# Patient Record
Sex: Male | Born: 1960 | Race: Black or African American | Hispanic: No | Marital: Married | Smoking: Current every day smoker
Health system: Southern US, Community
[De-identification: ages and names within clinical notes are randomized; demographics above are authoritative.]

## PROBLEM LIST (undated history)

## (undated) DIAGNOSIS — K219 Gastro-esophageal reflux disease without esophagitis: Secondary | ICD-10-CM

## (undated) DIAGNOSIS — I10 Essential (primary) hypertension: Secondary | ICD-10-CM

## (undated) DIAGNOSIS — I219 Acute myocardial infarction, unspecified: Secondary | ICD-10-CM

## (undated) DIAGNOSIS — K5792 Diverticulitis of intestine, part unspecified, without perforation or abscess without bleeding: Secondary | ICD-10-CM

## (undated) DIAGNOSIS — J189 Pneumonia, unspecified organism: Secondary | ICD-10-CM

## (undated) DIAGNOSIS — M1990 Unspecified osteoarthritis, unspecified site: Secondary | ICD-10-CM

## (undated) DIAGNOSIS — G4730 Sleep apnea, unspecified: Secondary | ICD-10-CM

## (undated) DIAGNOSIS — M7120 Synovial cyst of popliteal space [Baker], unspecified knee: Secondary | ICD-10-CM

## (undated) DIAGNOSIS — Z8719 Personal history of other diseases of the digestive system: Secondary | ICD-10-CM

## (undated) HISTORY — PX: CARPAL TUNNEL RELEASE: SHX101

## (undated) HISTORY — PX: CARDIAC CATHETERIZATION: SHX172

## (undated) HISTORY — PX: KNEE SURGERY: SHX244

## (undated) HISTORY — PX: HAND SURGERY: SHX662

---

## 2004-01-04 DIAGNOSIS — I219 Acute myocardial infarction, unspecified: Secondary | ICD-10-CM

## 2004-01-04 HISTORY — DX: Acute myocardial infarction, unspecified: I21.9

## 2005-01-12 ENCOUNTER — Ambulatory Visit (HOSPITAL_BASED_OUTPATIENT_CLINIC_OR_DEPARTMENT_OTHER): Admission: RE | Admit: 2005-01-12 | Discharge: 2005-01-12 | Payer: Self-pay | Admitting: *Deleted

## 2005-07-06 ENCOUNTER — Inpatient Hospital Stay (HOSPITAL_COMMUNITY): Admission: EM | Admit: 2005-07-06 | Discharge: 2005-07-07 | Payer: Self-pay | Admitting: Emergency Medicine

## 2005-07-06 ENCOUNTER — Ambulatory Visit: Payer: Self-pay | Admitting: Family Medicine

## 2005-07-12 ENCOUNTER — Encounter (HOSPITAL_COMMUNITY): Admission: AD | Admit: 2005-07-12 | Discharge: 2005-09-15 | Payer: Self-pay | Admitting: Cardiovascular Disease

## 2005-07-22 ENCOUNTER — Ambulatory Visit: Payer: Self-pay | Admitting: Family Medicine

## 2005-08-08 ENCOUNTER — Ambulatory Visit: Payer: Self-pay | Admitting: Sports Medicine

## 2005-09-07 ENCOUNTER — Ambulatory Visit: Payer: Self-pay | Admitting: Family Medicine

## 2005-09-08 ENCOUNTER — Ambulatory Visit: Payer: Self-pay | Admitting: Family Medicine

## 2005-10-06 ENCOUNTER — Ambulatory Visit: Payer: Self-pay | Admitting: Family Medicine

## 2006-03-02 DIAGNOSIS — R079 Chest pain, unspecified: Secondary | ICD-10-CM | POA: Insufficient documentation

## 2006-03-02 DIAGNOSIS — F411 Generalized anxiety disorder: Secondary | ICD-10-CM

## 2006-03-02 DIAGNOSIS — F419 Anxiety disorder, unspecified: Secondary | ICD-10-CM | POA: Insufficient documentation

## 2006-03-09 ENCOUNTER — Encounter (INDEPENDENT_AMBULATORY_CARE_PROVIDER_SITE_OTHER): Payer: Self-pay | Admitting: Family Medicine

## 2006-03-09 ENCOUNTER — Ambulatory Visit: Payer: Self-pay | Admitting: Family Medicine

## 2006-03-09 DIAGNOSIS — R519 Headache, unspecified: Secondary | ICD-10-CM | POA: Insufficient documentation

## 2006-03-09 DIAGNOSIS — R635 Abnormal weight gain: Secondary | ICD-10-CM | POA: Insufficient documentation

## 2006-03-09 DIAGNOSIS — I10 Essential (primary) hypertension: Secondary | ICD-10-CM | POA: Insufficient documentation

## 2006-03-09 DIAGNOSIS — R51 Headache: Secondary | ICD-10-CM | POA: Insufficient documentation

## 2006-03-09 DIAGNOSIS — R5383 Other fatigue: Secondary | ICD-10-CM

## 2006-03-09 DIAGNOSIS — R5381 Other malaise: Secondary | ICD-10-CM

## 2006-03-09 LAB — CONVERTED CEMR LAB
Free T4: 1.18 ng/dL (ref 0.89–1.80)
TSH: 1.046 microintl units/mL (ref 0.350–5.50)

## 2006-03-10 ENCOUNTER — Encounter (INDEPENDENT_AMBULATORY_CARE_PROVIDER_SITE_OTHER): Payer: Self-pay

## 2006-04-19 ENCOUNTER — Ambulatory Visit: Payer: Self-pay | Admitting: Family Medicine

## 2006-04-19 DIAGNOSIS — J309 Allergic rhinitis, unspecified: Secondary | ICD-10-CM | POA: Insufficient documentation

## 2006-06-20 ENCOUNTER — Encounter (INDEPENDENT_AMBULATORY_CARE_PROVIDER_SITE_OTHER): Payer: Self-pay | Admitting: *Deleted

## 2007-04-17 ENCOUNTER — Telehealth: Payer: Self-pay | Admitting: *Deleted

## 2007-04-19 ENCOUNTER — Encounter (INDEPENDENT_AMBULATORY_CARE_PROVIDER_SITE_OTHER): Payer: Self-pay | Admitting: Family Medicine

## 2007-04-19 ENCOUNTER — Ambulatory Visit: Payer: Self-pay | Admitting: Family Medicine

## 2007-04-19 DIAGNOSIS — E669 Obesity, unspecified: Secondary | ICD-10-CM

## 2007-04-19 DIAGNOSIS — F17200 Nicotine dependence, unspecified, uncomplicated: Secondary | ICD-10-CM

## 2007-04-24 ENCOUNTER — Telehealth (INDEPENDENT_AMBULATORY_CARE_PROVIDER_SITE_OTHER): Payer: Self-pay | Admitting: Family Medicine

## 2007-05-31 ENCOUNTER — Telehealth (INDEPENDENT_AMBULATORY_CARE_PROVIDER_SITE_OTHER): Payer: Self-pay | Admitting: Family Medicine

## 2007-05-31 ENCOUNTER — Ambulatory Visit: Payer: Self-pay | Admitting: Family Medicine

## 2007-05-31 DIAGNOSIS — R4589 Other symptoms and signs involving emotional state: Secondary | ICD-10-CM | POA: Insufficient documentation

## 2007-08-07 ENCOUNTER — Encounter: Payer: Self-pay | Admitting: *Deleted

## 2007-10-07 ENCOUNTER — Emergency Department (HOSPITAL_COMMUNITY): Admission: EM | Admit: 2007-10-07 | Discharge: 2007-10-08 | Payer: Self-pay | Admitting: Emergency Medicine

## 2007-10-16 ENCOUNTER — Telehealth: Payer: Self-pay | Admitting: Family Medicine

## 2007-10-17 ENCOUNTER — Ambulatory Visit: Payer: Self-pay | Admitting: Family Medicine

## 2007-10-17 ENCOUNTER — Encounter (INDEPENDENT_AMBULATORY_CARE_PROVIDER_SITE_OTHER): Payer: Self-pay | Admitting: Family Medicine

## 2007-10-17 ENCOUNTER — Telehealth: Payer: Self-pay | Admitting: *Deleted

## 2007-10-17 DIAGNOSIS — R3129 Other microscopic hematuria: Secondary | ICD-10-CM

## 2007-10-17 DIAGNOSIS — Z87448 Personal history of other diseases of urinary system: Secondary | ICD-10-CM | POA: Insufficient documentation

## 2007-10-17 DIAGNOSIS — R109 Unspecified abdominal pain: Secondary | ICD-10-CM | POA: Insufficient documentation

## 2007-10-17 LAB — CONVERTED CEMR LAB
ALT: 19 units/L
AST: 19 units/L
Glucose, Urine, Semiquant: NEGATIVE
Hemoglobin: 14.8 g/dL
LDL Cholesterol: 86 mg/dL
Specific Gravity, Urine: 1.02
pH: 8

## 2009-01-25 ENCOUNTER — Inpatient Hospital Stay (HOSPITAL_COMMUNITY): Admission: EM | Admit: 2009-01-25 | Discharge: 2009-01-28 | Payer: Self-pay | Admitting: Emergency Medicine

## 2009-01-28 ENCOUNTER — Ambulatory Visit: Payer: Self-pay | Admitting: Infectious Diseases

## 2010-03-22 LAB — DIFFERENTIAL
Basophils Absolute: 0 10*3/uL (ref 0.0–0.1)
Basophils Absolute: 0 10*3/uL (ref 0.0–0.1)
Eosinophils Relative: 2 % (ref 0–5)
Eosinophils Relative: 3 % (ref 0–5)
Lymphocytes Relative: 11 % — ABNORMAL LOW (ref 12–46)
Lymphocytes Relative: 15 % (ref 12–46)
Lymphocytes Relative: 8 % — ABNORMAL LOW (ref 12–46)
Lymphs Abs: 1.5 10*3/uL (ref 0.7–4.0)
Lymphs Abs: 1.7 10*3/uL (ref 0.7–4.0)
Monocytes Absolute: 1.2 10*3/uL — ABNORMAL HIGH (ref 0.1–1.0)
Monocytes Absolute: 1.8 10*3/uL — ABNORMAL HIGH (ref 0.1–1.0)
Neutro Abs: 15.2 10*3/uL — ABNORMAL HIGH (ref 1.7–7.7)
Neutro Abs: 7.9 10*3/uL — ABNORMAL HIGH (ref 1.7–7.7)
Neutro Abs: 9.8 10*3/uL — ABNORMAL HIGH (ref 1.7–7.7)
Neutrophils Relative %: 77 % (ref 43–77)
Neutrophils Relative %: 82 % — ABNORMAL HIGH (ref 43–77)

## 2010-03-22 LAB — CULTURE, ROUTINE-ABSCESS

## 2010-03-22 LAB — CBC
HCT: 41.2 % (ref 39.0–52.0)
Hemoglobin: 12.8 g/dL — ABNORMAL LOW (ref 13.0–17.0)
MCHC: 32.9 g/dL (ref 30.0–36.0)
MCV: 87.4 fL (ref 78.0–100.0)
MCV: 87.8 fL (ref 78.0–100.0)
MCV: 87.8 fL (ref 78.0–100.0)
Platelets: 181 10*3/uL (ref 150–400)
Platelets: 185 10*3/uL (ref 150–400)
Platelets: 201 10*3/uL (ref 150–400)
Platelets: 210 10*3/uL (ref 150–400)
RBC: 4.37 MIL/uL (ref 4.22–5.81)
RBC: 4.41 MIL/uL (ref 4.22–5.81)
RBC: 4.71 MIL/uL (ref 4.22–5.81)
RDW: 12.2 % (ref 11.5–15.5)
RDW: 12.4 % (ref 11.5–15.5)
RDW: 12.7 % (ref 11.5–15.5)
RDW: 12.7 % (ref 11.5–15.5)
WBC: 10.9 10*3/uL — ABNORMAL HIGH (ref 4.0–10.5)
WBC: 16 10*3/uL — ABNORMAL HIGH (ref 4.0–10.5)
WBC: 18.6 10*3/uL — ABNORMAL HIGH (ref 4.0–10.5)

## 2010-03-22 LAB — RAPID URINE DRUG SCREEN, HOSP PERFORMED
Barbiturates: NOT DETECTED
Benzodiazepines: NOT DETECTED
Cocaine: NOT DETECTED
Opiates: POSITIVE — AB

## 2010-03-22 LAB — BASIC METABOLIC PANEL
BUN: 12 mg/dL (ref 6–23)
BUN: 12 mg/dL (ref 6–23)
CO2: 26 mEq/L (ref 19–32)
Calcium: 8.4 mg/dL (ref 8.4–10.5)
Chloride: 110 mEq/L (ref 96–112)
Creatinine, Ser: 0.89 mg/dL (ref 0.4–1.5)
GFR calc Af Amer: >60 mL/min (ref >60)
GFR calc Af Amer: >60 mL/min (ref >60)
GFR calc non Af Amer: >60 mL/min (ref >60)
Glucose, Bld: 108 mg/dL — ABNORMAL HIGH (ref 70–99)
Potassium: 3.7 mEq/L (ref 3.5–5.1)
Sodium: 135 mEq/L (ref 135–145)

## 2010-03-22 LAB — COMPREHENSIVE METABOLIC PANEL
Alkaline Phosphatase: 62 U/L (ref 39–117)
CO2: 26 mEq/L (ref 19–32)
Chloride: 107 mEq/L (ref 96–112)
GFR calc Af Amer: >60 mL/min (ref >60)
GFR calc non Af Amer: >60 mL/min (ref >60)
Glucose, Bld: 128 mg/dL — ABNORMAL HIGH (ref 70–99)
Potassium: 3.5 mEq/L (ref 3.5–5.1)
Sodium: 138 mEq/L (ref 135–145)
Total Bilirubin: 0.6 mg/dL (ref 0.3–1.2)
Total Protein: 5.7 g/dL — ABNORMAL LOW (ref 6.0–8.3)

## 2010-03-22 LAB — HIV ANTIBODY (ROUTINE TESTING W REFLEX): HIV: NONREACTIVE

## 2010-03-22 LAB — CULTURE, BLOOD (ROUTINE X 2)
Culture: NO GROWTH
Culture: NO GROWTH

## 2010-03-22 LAB — LIPID PANEL: Triglycerides: 135 mg/dL (ref <150)

## 2010-05-21 NOTE — Op Note (Signed)
NAME:  Raymond [name], Raymond [name]               ACCOUNT NO.:  [id number]   MEDICAL RECORD NO.:  [id number]          PATIENT TYPE:  AMB   LOCATION:  DSC                          FACILITY:  [facility]   PHYSICIAN:  Lowell Bouton, M.D.DATE OF BIRTH:  01-10-1960   DATE OF PROCEDURE:  01/12/2005  DATE OF DISCHARGE:                                 OPERATIVE REPORT   PREOP DIAGNOSIS:  Right carpal tunnel syndrome.   POSTOP DIAGNOSIS:  Right carpal tunnel syndrome.   PROCEDURE:  Decompression median nerve, right carpal tunnel.   SURGEON:  Dr. Metro Kung.   ANESTHESIA:  0.5% Marcaine local with sedation.   OPERATIVE FINDINGS:  The patient had no masses in the carpal canal. The  motor branch of the nerve was intact.   PROCEDURE:  Under 0.5% Marcaine local anesthesia with a tourniquet on the  right arm. The right hand was prepped and draped in usual fashion and after  exsanguinating the limb, the tourniquet was inflated to 250 mmHg. A 3 cm  longitudinal incision was made in the palm just ulnar to the thenar crease.  Sharp dissection was carried through the subcutaneous tissues. Blunt  dissection was carried through the superficial palmar fascia distal to the  transverse carpal ligament. A hemostat was then placed in the carpal canal  up against the hook of the hamate and the transverse carpal ligament was  divided on the ulnar border of median nerve. The proximal end of the  ligament was divided with the scissors after dissecting the nerve away from  the undersurface of the ligament. The carpal canal was then palpated and was  found to be adequately decompressed the nerve was examined and the motor  branch was identified. The wound was then irrigated copiously with saline.  Skin was closed with 4-0 nylon suture. Sterile dressings were applied  followed by a volar wrist splint. The patient tolerated procedure well, went  to recovery room awake and stable in good condition.      Lowell Bouton, M.D.  Electronically Signed     EMM/MEDQ  D:  01/12/2005  T:  01/12/2005  Job:  [id number]

## 2010-05-21 NOTE — H&P (Signed)
NAME:  Raymond [name], Raymond [name]               ACCOUNT NO.:  [id number]   MEDICAL RECORD NO.:  [id number]          PATIENT TYPE:  EMS   LOCATION:  MAJO                         FACILITY:  [facility]   PHYSICIAN:  Wayne A. Sheffield Slider, M.D.    DATE OF BIRTH:  1960/06/10   DATE OF ADMISSION:  07/06/2005  DATE OF DISCHARGE:                                HISTORY & PHYSICAL   PRIMARY CARE PHYSICIAN:  Chest pain.   HISTORY OF PRESENT ILLNESS:  A 50 year old black male who was walking today  and experienced the onset of chest pressure substernally with shortness of  breath and dizziness, which was not relieved with rest.  EMS was activated.  Gave the patient nitroglycerin and aspirin, which subsequently relieved the  pain.  Patient rated his initial pain at 10 and now is painfree in the  emergency room.   REVIEW OF SYSTEMS:  Positive for history of palpitations, for which he was  told by his doctor at work to stop drinking caffeine.  Patient denied any  left arm or jaw pain accompanying his chest pain today.  He denies any  urinary or GI complaints.  He does have a positive history of recent  intentional weight loss through diet and exercise.  Patient admits to  noncompliance with antihypertensive medications.   PAST MEDICAL HISTORY:  Hypertension.   PAST SURGICAL HISTORY:  Carpal tunnel surgery in February, 2007, placement  of artificial knee cap in 1982, and a left wrist fusion in 1992.   ALLERGIES:  None.   MEDICATIONS:  Include hydrochlorothiazide 25 mg daily, potassium and Lasix,  doses of which are unknown.  Patient has not had any medications in the last  six months.   FAMILY HISTORY:  The patient with a mom who died of lung cancer.  His  father's medical history is unknown.  He has a sister with MS and a sister  with DVTs.   SOCIAL HISTORY:  He lives in [place] with his fiancee.  He works at  [employer].  He smokes 1-1/2 packs per day.  He  has been smoking for  approximately 25 years.  He denies any recent alcohol  use.  He has been sober for approximately 12 years and denies any other drug  use.   PHYSICAL EXAMINATION:  VITAL SIGNS:  Temp 97.6, pulse 84, blood pressure  133/68, respirations 14, 96% on room air.  GENERAL:  He is a well-developed and well-nourished black male alert and  oriented x3.  Slightly anxious.  HEENT:  Normocephalic and atraumatic.  Pupils are equal, round and reactive  to light.  Oral mucosa is pink and moist with poor dentition.  NECK:  Supple.  No lymphadenopathy.  LUNGS:  Clear to auscultation without wheezes, rales, or rhonchi.  CARDIOVASCULAR:  S1 and S2.  Regular rate and rhythm.  No murmurs, rubs or  gallops.  ABDOMEN:  Positive bowel sounds.  Soft, nontender.  No organomegaly.  EXTREMITIES:  Warm with no clubbing or cyanosis.  SKIN:  Without rashes.  NEUROLOGIC:  Cranial nerves II-XII are grossly intact with no focal  deficits.  Strength is 5/5 in both upper and lower extremities.   LABS:  EKG showed a normal sinus rhythm with a rate of approximately 85 with  inverted T waves in leads III and AVR, and 1 mm of ST elevation in V2 and  V3.   Sodium 144, potassium 4.1, chloride 111, BUN 13, creatinine 1.1, glucose 96.  Point-of-care enzymes showed a myoglobin of 58.5, CK-MB 1.2, troponin I less  than 0.05.   Chest x-ray was no acute findings.   ASSESSMENT/PLAN:  A 50 year old black male with:  1.  Atypical chest pain, here for rule out myocardial infarction:  Patient      with a history of hypertension and noncompliance with his medications,      obesity.  Although he has no known family history of cardiac issues, but      given his presentation and EKG changes, we will admit for observation      and to cycle his cardiac enzymes in order to rule out an MI.  If the      patient's cardiac enzymes are positive, will consult cardiology for      stress testing or catheterization.  Otherwise, the patient may  benefit      from stress test as an outpatient.  I will give him an aspirin daily,      holding his nitroglycerin for now.  Will give him nitroglycerin if his      chest pain resumes.  2.  Hypertension:  Blood pressure is stable.  Will not start      antihypertensive.  Will monitor.  3.  Tobacco use:  Encouraged the patient to stop smoking.  Offered him the      nicotine patch.  4.  Obesity:  The patient doing well to lose weight.  Will continue to      encourage him to continue.  Will get him a heart-healthy diet while here      in the hospital.  5.  Social:  Patient needs a primary care physician at discharge.  6.  Deep venous thrombosis prophylaxis:  The patient will be ambulatory.  7.  Gastrointestinal prophylaxis, Protonix.      Devra Dopp, MD    ______________________________  [name], M.D.    TH/MEDQ  D:  07/06/2005  T:  07/06/2005  Job:  [id number]

## 2010-05-21 NOTE — Discharge Summary (Signed)
Raymond [name], Raymond [name]               ACCOUNT NO.:  [id number]   MEDICAL RECORD NO.:  [id number]          PATIENT TYPE:  INP   LOCATION:  4712                         FACILITY:  [facility]   PHYSICIAN:  Lupita Raider, M.D.   DATE OF BIRTH:  04-27-1960   DATE OF ADMISSION:  07/06/2005  DATE OF DISCHARGE:  07/07/2005                                 DISCHARGE SUMMARY   CONSULTATION:  None.   PROCEDURE:  EKG on admission showed T-wave inversion in leads 3 and AVR and  1 mm ST segment elevation in lead V2 and V3.  Repeat EKG on the day of  discharge was mostly unchanged.   DISCHARGE DIAGNOSES:  1.  New onset typical angina with negative cardiac enzymes.  2.  History of hypertension.  3.  Smoking.  4.  Obesity.   DISCHARGE MEDICATIONS:  1.  Aspirin 81 mg p.o. daily.  2.  Nicoderm CQ 20 mg patch one daily x6 weeks.  3.  Nitrostat 0.4 mg sublingual q5 minutes p.r.n. chest pain.   DISCHARGE INSTRUCTIONS:  No diet or activity restrictions, although the  patient is encouraged for smoking cessation.  The patient is to followup at  [facility] outpatient admitting on Tuesday, July 12, 2005 at 7:45 a.m. for a  stress test with Myoview injection to be read by Dr. Ricki Rodriguez, and  also to followup with Dr. Clelia Croft at [facility] on Friday, July 22, 2005 at 3:00 p.m. for establishment of a primary care physician.   CONSULTATION:  None.   LABORATORY DATA:  On admission and discharge, Troponin I 0.02, 0.01, 0.02.  CBC and chemistries were within normal limits except a bicarbonate of 33.  Creatinine 1.2.  TSH 1.065.   BRIEF HISTORY AND PHYSICAL:  Please see dictated H and P on admission for  more details.  Briefly, this is a 50 year old African-American male with a  history of hypertension, tobacco use and abuse with sudden onset of  substernal chest pressure with associated shortness of breath and dizziness  that was not relieved by rest.  Nitro was given by EMS which relieved  his  chest pain.  The patient complained of a history of palpitations last year  at work and was told by his work physician to DC caffeine.  The patient  admits to noncompliance with hypertension medications x6 months and a  history of alcohol abuse, but has been sober x12  years.   HOSPITAL COURSE:  1.  Typical angina.  The patient was admitted for rule out MI secondary to      typical chest pain brought on by exertion and relieved by nitro with EKG      changes (inverted P-waves in 3 and AVR and 1 mm ST segment elevation in      V2, V3 (he was given aspirin by EMS).  Cardiac enzymes were negative x3      and his chest pain did not return.  Dr. Algie Coffer was consulted and      recommended inpatient treadmill stress with Myoview injection along with  a 2D echo.  The patient refused to stay secondary to having to give a      talk on the history of narcotics to an AA group in [place].  Given      that the patient had been pain free throughout admission with no events      on telemetry and no changes in his EKG with negative enzymes, I feel      comfortable allowing him to followup with a stress test on Tuesday with      Dr. Algie Coffer as an outpatient.  He was given script for nitro and      encouraged to take a baby aspirin daily.  He was also set up with me at      [facility] for followup and to establish a primary care physician,      specifically needed to address risk factors such as smoking, cholesterol      and check diabetes.  2.  Hypertension.  The patient remained normotensive throughout his      admission with a goal of less than 140/90.  There was no indication to      begin antihypertensives at this time.  This will need close followup      however, especially depending on the outcome of his cardiac evaluation.  3.  Tobacco use.  The patient was strongly encouraged to stop smoking and      given new prescription for Nicoderm CQ, will need to followup with how      he is  doing and plan for a taper with his followup appointment with me      in two weeks.           ______________________________  Lupita Raider, M.D.     KS/MEDQ  D:  07/07/2005  T:  07/07/2005  Job:  [id number]   cc:   Ricki Rodriguez, M.D.  Fax: [phone]

## 2010-10-04 LAB — URINALYSIS, ROUTINE W REFLEX MICROSCOPIC
Bilirubin Urine: NEGATIVE
Nitrite: NEGATIVE
Specific Gravity, Urine: 1.023
Urobilinogen, UA: 1
pH: 5.5

## 2013-01-23 ENCOUNTER — Ambulatory Visit: Payer: PRIVATE HEALTH INSURANCE | Attending: Internal Medicine | Admitting: Physical Therapy

## 2013-01-23 DIAGNOSIS — M545 Low back pain, unspecified: Secondary | ICD-10-CM | POA: Insufficient documentation

## 2013-01-23 DIAGNOSIS — IMO0001 Reserved for inherently not codable concepts without codable children: Secondary | ICD-10-CM | POA: Insufficient documentation

## 2013-01-23 DIAGNOSIS — M25559 Pain in unspecified hip: Secondary | ICD-10-CM | POA: Insufficient documentation

## 2013-01-28 ENCOUNTER — Ambulatory Visit: Payer: PRIVATE HEALTH INSURANCE | Admitting: Physical Therapy

## 2013-01-30 ENCOUNTER — Ambulatory Visit: Payer: PRIVATE HEALTH INSURANCE | Admitting: Physical Therapy

## 2013-02-04 ENCOUNTER — Encounter: Payer: 59 | Admitting: Rehabilitation

## 2013-02-06 ENCOUNTER — Ambulatory Visit: Payer: PRIVATE HEALTH INSURANCE | Attending: Internal Medicine | Admitting: Rehabilitation

## 2013-02-06 DIAGNOSIS — M545 Low back pain, unspecified: Secondary | ICD-10-CM | POA: Insufficient documentation

## 2013-02-06 DIAGNOSIS — IMO0001 Reserved for inherently not codable concepts without codable children: Secondary | ICD-10-CM | POA: Insufficient documentation

## 2013-02-06 DIAGNOSIS — M25559 Pain in unspecified hip: Secondary | ICD-10-CM | POA: Insufficient documentation

## 2013-02-12 ENCOUNTER — Ambulatory Visit: Payer: PRIVATE HEALTH INSURANCE | Admitting: Rehabilitation

## 2013-02-14 ENCOUNTER — Ambulatory Visit: Payer: PRIVATE HEALTH INSURANCE | Admitting: Rehabilitation

## 2013-02-26 ENCOUNTER — Ambulatory Visit: Payer: PRIVATE HEALTH INSURANCE | Admitting: Rehabilitation

## 2013-02-28 ENCOUNTER — Encounter: Payer: 59 | Admitting: Rehabilitation

## 2013-03-04 ENCOUNTER — Ambulatory Visit: Payer: PRIVATE HEALTH INSURANCE | Attending: Internal Medicine | Admitting: Rehabilitation

## 2013-03-04 DIAGNOSIS — M545 Low back pain, unspecified: Secondary | ICD-10-CM | POA: Insufficient documentation

## 2013-03-04 DIAGNOSIS — IMO0001 Reserved for inherently not codable concepts without codable children: Secondary | ICD-10-CM | POA: Insufficient documentation

## 2013-03-04 DIAGNOSIS — M25559 Pain in unspecified hip: Secondary | ICD-10-CM | POA: Insufficient documentation

## 2013-03-06 ENCOUNTER — Encounter: Payer: 59 | Admitting: Rehabilitation

## 2013-07-17 ENCOUNTER — Encounter (HOSPITAL_COMMUNITY): Payer: Self-pay | Admitting: Emergency Medicine

## 2013-07-17 ENCOUNTER — Emergency Department (HOSPITAL_COMMUNITY): Payer: PRIVATE HEALTH INSURANCE

## 2013-07-17 ENCOUNTER — Emergency Department (HOSPITAL_COMMUNITY)
Admission: EM | Admit: 2013-07-17 | Discharge: 2013-07-17 | Disposition: A | Discharge status: Home / Self Care | Payer: PRIVATE HEALTH INSURANCE | Attending: Emergency Medicine | Admitting: Emergency Medicine

## 2013-07-17 DIAGNOSIS — K5792 Diverticulitis of intestine, part unspecified, without perforation or abscess without bleeding: Secondary | ICD-10-CM

## 2013-07-17 DIAGNOSIS — K5900 Constipation, unspecified: Secondary | ICD-10-CM | POA: Insufficient documentation

## 2013-07-17 DIAGNOSIS — Z79899 Other long term (current) drug therapy: Secondary | ICD-10-CM | POA: Insufficient documentation

## 2013-07-17 DIAGNOSIS — E669 Obesity, unspecified: Secondary | ICD-10-CM | POA: Insufficient documentation

## 2013-07-17 DIAGNOSIS — F17200 Nicotine dependence, unspecified, uncomplicated: Secondary | ICD-10-CM | POA: Insufficient documentation

## 2013-07-17 DIAGNOSIS — K5732 Diverticulitis of large intestine without perforation or abscess without bleeding: Secondary | ICD-10-CM | POA: Insufficient documentation

## 2013-07-17 DIAGNOSIS — M129 Arthropathy, unspecified: Secondary | ICD-10-CM | POA: Insufficient documentation

## 2013-07-17 DIAGNOSIS — I10 Essential (primary) hypertension: Secondary | ICD-10-CM | POA: Insufficient documentation

## 2013-07-17 DIAGNOSIS — R197 Diarrhea, unspecified: Secondary | ICD-10-CM | POA: Insufficient documentation

## 2013-07-17 DIAGNOSIS — I252 Old myocardial infarction: Secondary | ICD-10-CM | POA: Insufficient documentation

## 2013-07-17 HISTORY — DX: Unspecified osteoarthritis, unspecified site: M19.90

## 2013-07-17 HISTORY — DX: Essential (primary) hypertension: I10

## 2013-07-17 HISTORY — DX: Acute myocardial infarction, unspecified: I21.9

## 2013-07-17 LAB — COMPREHENSIVE METABOLIC PANEL
ALBUMIN: 3.9 g/dL (ref 3.5–5.2)
ALT: 15 U/L (ref 0–53)
AST: 21 U/L (ref 0–37)
Alkaline Phosphatase: 88 U/L (ref 39–117)
Anion gap: 14 (ref 5–15)
BUN: 15 mg/dL (ref 6–23)
CALCIUM: 9.6 mg/dL (ref 8.4–10.5)
CO2: 24 meq/L (ref 19–32)
CREATININE: 0.92 mg/dL (ref 0.50–1.35)
Chloride: 104 mEq/L (ref 96–112)
GFR calc Af Amer: >90 mL/min (ref >90)
Glucose, Bld: 106 mg/dL — ABNORMAL HIGH (ref 70–99)
Potassium: 4 mEq/L (ref 3.7–5.3)
Sodium: 142 mEq/L (ref 137–147)
Total Bilirubin: 0.5 mg/dL (ref 0.3–1.2)
Total Protein: 7.5 g/dL (ref 6.0–8.3)

## 2013-07-17 LAB — CBC WITH DIFFERENTIAL/PLATELET
BASOS ABS: 0 10*3/uL (ref 0.0–0.1)
BASOS PCT: 0 % (ref 0–1)
EOS PCT: 1 % (ref 0–5)
Eosinophils Absolute: 0.1 10*3/uL (ref 0.0–0.7)
HEMATOCRIT: 43.4 % (ref 39.0–52.0)
Hemoglobin: 14.6 g/dL (ref 13.0–17.0)
Lymphocytes Relative: 14 % (ref 12–46)
Lymphs Abs: 1.8 10*3/uL (ref 0.7–4.0)
MCH: 28.5 pg (ref 26.0–34.0)
MCHC: 33.6 g/dL (ref 30.0–36.0)
MCV: 84.8 fL (ref 78.0–100.0)
MONO ABS: 1.3 10*3/uL — AB (ref 0.1–1.0)
Monocytes Relative: 10 % (ref 3–12)
Neutro Abs: 10.1 10*3/uL — ABNORMAL HIGH (ref 1.7–7.7)
Neutrophils Relative %: 75 % (ref 43–77)
Platelets: 232 10*3/uL (ref 150–400)
RBC: 5.12 MIL/uL (ref 4.22–5.81)
RDW: 12.4 % (ref 11.5–15.5)
WBC: 13.3 10*3/uL — ABNORMAL HIGH (ref 4.0–10.5)

## 2013-07-17 LAB — URINALYSIS, ROUTINE W REFLEX MICROSCOPIC
Bilirubin Urine: NEGATIVE
Glucose, UA: NEGATIVE mg/dL
Hgb urine dipstick: NEGATIVE
KETONES UR: NEGATIVE mg/dL
LEUKOCYTES UA: NEGATIVE
NITRITE: NEGATIVE
PH: 6.5 (ref 5.0–8.0)
Protein, ur: NEGATIVE mg/dL
Specific Gravity, Urine: 1.025 (ref 1.005–1.030)
Urobilinogen, UA: 1 mg/dL (ref 0.0–1.0)

## 2013-07-17 LAB — LIPASE, BLOOD: LIPASE: 18 U/L (ref 11–59)

## 2013-07-17 MED ORDER — METRONIDAZOLE IN NACL 5-0.79 MG/ML-% IV SOLN
500.0000 mg | Freq: Once | INTRAVENOUS | Status: AC
  Administered 2013-07-17: 500 mg via INTRAVENOUS
  Filled 2013-07-17: qty 100

## 2013-07-17 MED ORDER — METRONIDAZOLE 500 MG PO TABS: 500.0000 mg | ORAL_TABLET | Freq: Three times a day (TID) | ORAL | Status: DC

## 2013-07-17 MED ORDER — OXYCODONE-ACETAMINOPHEN 5-325 MG PO TABS: 2.0000 | ORAL_TABLET | ORAL | Status: DC | PRN

## 2013-07-17 MED ORDER — IOHEXOL 300 MG/ML  SOLN
100.0000 mL | Freq: Once | INTRAMUSCULAR | Status: AC | PRN
  Administered 2013-07-17: 100 mL via INTRAVENOUS

## 2013-07-17 MED ORDER — CIPROFLOXACIN IN D5W 400 MG/200ML IV SOLN: 500.0000 mg | Freq: Once | INTRAVENOUS | Status: DC

## 2013-07-17 MED ORDER — IOHEXOL 300 MG/ML  SOLN
50.0000 mL | Freq: Once | INTRAMUSCULAR | Status: AC | PRN
  Administered 2013-07-17: 50 mL via ORAL

## 2013-07-17 MED ORDER — CIPROFLOXACIN IN D5W 400 MG/200ML IV SOLN
400.0000 mg | Freq: Once | INTRAVENOUS | Status: AC
  Administered 2013-07-17: 400 mg via INTRAVENOUS
  Filled 2013-07-17: qty 200

## 2013-07-17 MED ORDER — MORPHINE SULFATE 4 MG/ML IJ SOLN
4.0000 mg | Freq: Once | INTRAMUSCULAR | Status: AC
  Administered 2013-07-17: 4 mg via INTRAVENOUS
  Filled 2013-07-17: qty 1

## 2013-07-17 MED ORDER — CIPROFLOXACIN HCL 500 MG PO TABS: 500.0000 mg | ORAL_TABLET | Freq: Two times a day (BID) | ORAL | Status: DC

## 2013-07-17 MED ORDER — ONDANSETRON HCL 4 MG/2ML IJ SOLN
4.0000 mg | Freq: Once | INTRAMUSCULAR | Status: AC
  Administered 2013-07-17: 4 mg via INTRAVENOUS
  Filled 2013-07-17: qty 2

## 2013-07-17 MED ORDER — ONDANSETRON 4 MG PO TBDP: 4.0000 mg | ORAL_TABLET | Freq: Three times a day (TID) | ORAL | Status: DC | PRN

## 2013-07-17 NOTE — ED Provider Notes (Signed)
CSN: [id number]     Arrival date & time 07/17/13  3762 History   First MD Initiated Contact with Patient 07/17/13 1032     Chief Complaint  Patient presents with  . Abdominal Pain     (Consider location/radiation/quality/duration/timing/severity/associated sxs/prior Treatment) HPI Comments: Patient is a 53 year old male with a past medical history of hypertension, obesity, and tobacco abuse who presents with a 3 week history of abdominal pain. The pain is located in the left lower abdomen and does not radiate. The pain is described as aching and severe. The pain started gradually and progressively worsened since the onset. Patient reports brief relief of symptoms last night then the pain restarted with greater severity. No alleviating/aggravating factors. The patient has tried nothing for symptoms without relief. Associated symptoms include constipation and now diarrhea. Patient denies fever, headache, NVD, chest pain, SOB, dysuria. Patient reports history of abdominal abscess which felt the same as he does now.   Patient is a 53 y.o. male presenting with abdominal pain.  Abdominal Pain Associated symptoms: diarrhea   Associated symptoms: no chest pain, no chills, no dysuria, no fatigue, no fever, no nausea, no shortness of breath and no vomiting     Past Medical History  Diagnosis Date  . Heart attack   . Arthritis   . Hypertension    Past Surgical History  Procedure Laterality Date  . Knee surgery      left  . Hand surgery      left  . Carpal tunnel release      right   No family history on file. History  Substance Use Topics  . Smoking status: Current Every Day Smoker  . Smokeless tobacco: Never Used  . Alcohol Use: No    Review of Systems  Constitutional: Negative for fever, chills and fatigue.  HENT: Negative for trouble swallowing.   Eyes: Negative for visual disturbance.  Respiratory: Negative for shortness of breath.   Cardiovascular: Negative for chest pain and  palpitations.  Gastrointestinal: Positive for abdominal pain and diarrhea. Negative for nausea and vomiting.  Genitourinary: Negative for dysuria and difficulty urinating.  Musculoskeletal: Negative for arthralgias and neck pain.  Skin: Negative for color change.  Neurological: Negative for dizziness and weakness.  Psychiatric/Behavioral: Negative for dysphoric mood.      Allergies  Review of patient's allergies indicates no known allergies.  Home Medications   Prior to Admission medications   Medication Sig Start Date End Date Taking? Authorizing Provider  amLODipine (NORVASC) 5 MG tablet Take 5 mg by mouth daily.   Yes Historical Provider, MD  ibuprofen (ADVIL,MOTRIN) 200 MG tablet Take 400 mg by mouth every 6 (six) hours as needed for moderate pain.   Yes Historical Provider, MD  lisinopril (PRINIVIL,ZESTRIL) 20 MG tablet Take 20 mg by mouth daily.   Yes Historical Provider, MD  omeprazole (PRILOSEC) 20 MG capsule Take 20 mg by mouth daily.     Yes Historical Provider, MD  potassium chloride SA (K-DUR,KLOR-CON) 20 MEQ tablet Take 20 mEq by mouth daily.   Yes Historical Provider, MD   BP 160/85  Pulse 93  Temp(Src) 99.8 F (37.7 C) (Oral)  Resp 18  Ht 5\' 10"  (1.778 m)  Wt 270 lb (122.471 kg)  BMI 38.74 kg/m2  SpO2 96% Physical Exam  Nursing note and vitals reviewed. Constitutional: He is oriented to person, place, and time. He appears well-developed and well-nourished. No distress.  HENT:  Head: Normocephalic and atraumatic.  Eyes: Conjunctivae and  EOM are normal.  Neck: Normal range of motion.  Cardiovascular: Normal rate and regular rhythm.  Exam reveals no gallop and no friction rub.   No murmur heard. Pulmonary/Chest: Effort normal and breath sounds normal. He has no wheezes. He has no rales. He exhibits no tenderness.  Abdominal: Soft. He exhibits no distension. There is tenderness. There is no rebound and no guarding.  Left lower abdominal tenderness to palpation.  No other focal tenderness or peritoneal signs.   Musculoskeletal: Normal range of motion.  Neurological: He is alert and oriented to person, place, and time. Coordination normal.  Speech is goal-oriented. Moves limbs without ataxia.   Skin: Skin is warm and dry.  Psychiatric: He has a normal mood and affect. His behavior is normal.    ED Course  Procedures (including critical care time) Labs Review Labs Reviewed  CBC WITH DIFFERENTIAL - Abnormal; Notable for the following:    WBC 13.3 (*)    Neutro Abs 10.1 (*)    Monocytes Absolute 1.3 (*)    All other components within normal limits  COMPREHENSIVE METABOLIC PANEL - Abnormal; Notable for the following:    Glucose, Bld 106 (*)    All other components within normal limits  LIPASE, BLOOD  URINALYSIS, ROUTINE W REFLEX MICROSCOPIC    Imaging Review Ct Abdomen Pelvis W Contrast  07/17/2013   CLINICAL DATA:  Left lower quadrant pain, left flank pain.  EXAM: CT ABDOMEN AND PELVIS WITH CONTRAST  TECHNIQUE: Multidetector CT imaging of the abdomen and pelvis was performed using the standard protocol following bolus administration of intravenous contrast.  CONTRAST:  35mL OMNIPAQUE IOHEXOL 300 MG/ML SOLN, 135mL OMNIPAQUE IOHEXOL 300 MG/ML SOLN  COMPARISON:  01/25/2009.  FINDINGS: Lung bases show no acute findings. Heart size normal. No pericardial or pleural effusion.  Liver is unremarkable. Small stones are seen in the gallbladder. Right adrenal gland is unremarkable. Slight nodular thickening of the left adrenal gland, stable. Kidneys, spleen, pancreas, stomach and small bowel are unremarkable. There is thickening of the proximal sigmoid colon with diverticula and pericolonic stranding. Fair amount of stool is seen in the colon, indicative of constipation.  No pathologically enlarged lymph nodes. Tiny pelvic free fluid. Scattered atherosclerotic calcification of the arterial vasculature without abdominal aortic aneurysm. Calcifications are seen in  the prostate. Bladder is fairly decompressed. No worrisome lytic or sclerotic lesions. Degenerative changes are seen in the spine.  IMPRESSION: 1. Wall thickening and inflammatory changes involving the proximal sigmoid colon are indicative of diverticulitis. An underlying mass cannot be definitively excluded. If colonoscopy has not been performed, consider follow-up optical or virtual colonoscopy after antibiotic therapy, as clinically indicated. 2. Tiny pelvic free fluid. 3. Cholelithiasis.   Electronically Signed   By: Lorin Picket M.D.   On: 07/17/2013 13:20   Dg Abd Acute W/chest  07/17/2013   CLINICAL DATA:  Severe left lower abdominal pain for 3 weeks.  EXAM: ACUTE ABDOMEN SERIES (ABDOMEN 2 VIEW & CHEST 1 VIEW)  COMPARISON:  CT abdomen and pelvis 01/25/2009 and chest radiograph 07/06/2005  FINDINGS: The cardiomediastinal silhouette is within normal limits. The lungs are well inflated. Minimal linear opacity in the left lung base may reflect subsegmental atelectasis or scarring. The lungs are otherwise clear. No pleural effusion or pneumothorax is seen.  There is no evidence of intraperitoneal free air. Gas is present in the colon to the level of the rectosigmoid. There is a moderate amount of stool in the proximal colon. Colon is nondilated. There are  a few loops of mildly prominent small bowel in the central abdomen. Lower lumbar spondylosis is noted. Calcification in the pelvis likely represents a phlebolith.  IMPRESSION: 1. Clear lungs. 2. Mild prominence of a few small-bowel loops in the central abdomen without evidence of high-grade obstruction. Mild, regional ileus or possibly partial obstruction are not excluded.   Electronically Signed   By: Logan Bores   On: 07/17/2013 10:41     EKG Interpretation None      MDM   Final diagnoses:  Acute diverticulitis    11:15 AM Patient's labs show elevated WBC. Urinalysis unremarkable. Patient has a low grade temp with remaining vitals stable.  Patient will have morphine and zofran for symptoms. Patient will have CT abdomen and pelvis.   3:05 PM Patient's CT abdomen pelvis shows diverticulitis. Patient will have IV Flagyl and Cipro. Patient is comfortable with being discharged home and returning to the ED with worsening or concerning symptoms. Patient understands the plan and is agreeable.     Alvina Chou, PA-C 07/17/13 1506

## 2013-07-17 NOTE — ED Notes (Signed)
Pt c/o lower abd pain radiating to left flank x 3 wks; worse today; no nausea or vomiting; states has noted blood in stool and urine; facial grimacing

## 2013-07-17 NOTE — ED Provider Notes (Signed)
Medical screening examination/treatment/procedure(s) were performed by non-physician practitioner and as supervising physician I was immediately available for consultation/collaboration.  Leota Jacobsen, MD 07/17/13 [phone]

## 2013-07-17 NOTE — Discharge Instructions (Signed)
Take Cipro and Flagyl as directed until gone. Take Percocet as needed for pain. Take zofran as needed for nausea. Refer to attached documents for more information. Return to the ED with worsening or concerning symptoms.

## 2013-07-17 NOTE — ED Notes (Signed)
Pt states started having abdominal pain, more to L side about 2 weeks ago, stated has been dealing w/ constipation and had a BM, states pain went away, then last night pain came back, states more severe on L side, last BM was this morning, denies n/v/d.

## 2014-09-21 ENCOUNTER — Emergency Department (HOSPITAL_COMMUNITY): Payer: Medicare Other

## 2014-09-21 ENCOUNTER — Emergency Department (EMERGENCY_DEPARTMENT_HOSPITAL)
Admit: 2014-09-21 | Discharge: 2014-09-21 | Disposition: A | Discharge status: Home / Self Care | Payer: Medicare Other | Attending: Emergency Medicine | Admitting: Emergency Medicine

## 2014-09-21 ENCOUNTER — Encounter (HOSPITAL_COMMUNITY): Payer: Self-pay | Admitting: Emergency Medicine

## 2014-09-21 ENCOUNTER — Emergency Department (HOSPITAL_COMMUNITY)
Admission: EM | Admit: 2014-09-21 | Discharge: 2014-09-21 | Disposition: A | Discharge status: Home / Self Care | Payer: Medicare Other | Attending: Emergency Medicine | Admitting: Emergency Medicine

## 2014-09-21 DIAGNOSIS — I252 Old myocardial infarction: Secondary | ICD-10-CM | POA: Diagnosis not present

## 2014-09-21 DIAGNOSIS — Z720 Tobacco use: Secondary | ICD-10-CM | POA: Insufficient documentation

## 2014-09-21 DIAGNOSIS — I10 Essential (primary) hypertension: Secondary | ICD-10-CM | POA: Diagnosis not present

## 2014-09-21 DIAGNOSIS — M7121 Synovial cyst of popliteal space [Baker], right knee: Secondary | ICD-10-CM | POA: Diagnosis not present

## 2014-09-21 DIAGNOSIS — M25561 Pain in right knee: Secondary | ICD-10-CM | POA: Diagnosis not present

## 2014-09-21 DIAGNOSIS — M79609 Pain in unspecified limb: Secondary | ICD-10-CM

## 2014-09-21 LAB — BASIC METABOLIC PANEL
Anion gap: 7 (ref 5–15)
BUN: 10 mg/dL (ref 6–20)
CHLORIDE: 109 mmol/L (ref 101–111)
CO2: 25 mmol/L (ref 22–32)
CREATININE: 0.91 mg/dL (ref 0.61–1.24)
Calcium: 9.4 mg/dL (ref 8.9–10.3)
GFR calc non Af Amer: >60 mL/min (ref >60)
GLUCOSE: 97 mg/dL (ref 65–99)
Potassium: 4 mmol/L (ref 3.5–5.1)
Sodium: 141 mmol/L (ref 135–145)

## 2014-09-21 LAB — CBC
HCT: 45.1 % (ref 39.0–52.0)
Hemoglobin: 15 g/dL (ref 13.0–17.0)
MCH: 29.2 pg (ref 26.0–34.0)
MCHC: 33.3 g/dL (ref 30.0–36.0)
MCV: 87.7 fL (ref 78.0–100.0)
PLATELETS: 221 10*3/uL (ref 150–400)
RBC: 5.14 MIL/uL (ref 4.22–5.81)
RDW: 12.4 % (ref 11.5–15.5)
WBC: 8.5 10*3/uL (ref 4.0–10.5)

## 2014-09-21 LAB — I-STAT TROPONIN, ED: Troponin i, poc: 0 ng/mL (ref 0.00–0.08)

## 2014-09-21 MED ORDER — NAPROXEN 500 MG PO TABS: 500.0000 mg | ORAL_TABLET | Freq: Two times a day (BID) | ORAL | Status: DC

## 2014-09-21 NOTE — ED Notes (Signed)
Right knee pain with posterior swelling. Painful to touch. Previous knee surgery on same (1980's). NOT warm to touch. decreased pain when relaxed

## 2014-09-21 NOTE — Discharge Instructions (Signed)
Baker Cyst °A Baker cyst is a sac-like structure that forms in the back of the knee. It is filled with the same fluid that is located in your knee. This fluid lubricates the bones and cartilage of the knee and allows them to move over each other more easily. °CAUSES  °When the knee becomes injured or inflamed, increased fluid forms in the knee. When this happens, the joint lining is pushed out behind the knee and forms the Baker cyst. This cyst may also be caused by inflammation from arthritic conditions and infections. °SIGNS AND SYMPTOMS  °A Baker cyst usually has no symptoms. When the cyst is substantially enlarged: °· You may feel pressure behind the knee, stiffness in the knee, or a mass in the area behind the knee. °· You may develop pain, redness, and swelling in the calf.  This can suggest a blood clot and requires evaluation by your health care provider. °DIAGNOSIS  °A Baker cyst is most often found during an ultrasound exam. This exam may have been performed for other reasons, and the cyst was found incidentally. Sometimes an MRI is used. This picks up other problems within a joint that an ultrasound exam may not. If the Baker cyst developed immediately after an injury, X-ray exams may be used to diagnose the cyst. °TREATMENT  °The treatment depends on the cause of the cyst. Anti-inflammatory medicines and rest often will be prescribed. If the cyst is caused by a bacterial infection, antibiotic medicines may be prescribed.  °HOME CARE INSTRUCTIONS  °· If the cyst was caused by an injury, for the first 24 hours, keep the injured leg elevated on 2 pillows while lying down. °· For the first 24 hours while you are awake, apply ice to the injured area: °¨ Put ice in a plastic bag. °¨ Place a towel between your skin and the bag. °¨ Leave the ice on for 20 minutes, 2-3 times a day. °· Only take over-the-counter or prescription medicines for pain, discomfort, or fever as directed by your health care  provider. °· Only take antibiotic medicine as directed. Make sure to finish it even if you start to feel better. °MAKE SURE YOU:  °· Understand these instructions. °· Will watch your condition. °· Will get help right away if you are not doing well or get worse. °Document Released: 12/20/2004 Document Revised: 10/10/2012 Document Reviewed: 08/01/2012 °ExitCare® Patient Information ©2015 [employer]. This information is not intended to replace advice given to you by your health care provider. Make sure you discuss any questions you have with your health care provider. ° °

## 2014-09-21 NOTE — ED Provider Notes (Signed)
Patient care transferred from [facility], PA-C, at change of shift, pending LE doppler.  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 No evidence for DVT. Patient can be discharged with care instructions and ortho follow up.  Charlann Lange, PA-C 09/21/14 [place], MD 09/21/14 [phone]

## 2014-09-21 NOTE — Progress Notes (Signed)
VASCULAR LAB PRELIMINARY  PRELIMINARY  PRELIMINARY  PRELIMINARY  Right lower extremity venous duplex completed.    Preliminary report:  There is no DVT or SVT noted in the right lower extremity.  There is a moderate to large Baker's cyst noted in the right popliteal fossa.   KANADY, CANDACE, RVT 09/21/2014, 5:05 PM

## 2014-11-25 NOTE — ED Provider Notes (Signed)
CSN: [id number]     Arrival date & time 09/21/14  1339 History   First MD Initiated Contact with Patient 09/21/14 1624     Chief Complaint  Patient presents with  . Knee Pain     (Consider location/radiation/quality/duration/timing/severity/associated sxs/prior Treatment) HPI Comments: Patient with h/o MI, no blood thinners -- presents with c/o R posterior knee swelling and pain for several days. Worse with palpation. No h/o DVT. [name] ambulatory. No fever, N/V. No injury. The onset of this condition was acute. The course is constant. Alleviating factors: none.    Patient is a 54 y.o. male presenting with knee pain. The history is provided by the patient.  Knee Pain Associated symptoms: no neck pain     Past Medical History  Diagnosis Date  . Heart attack   . Arthritis   . Hypertension    Past Surgical History  Procedure Laterality Date  . Knee surgery      left  . Hand surgery      left  . Carpal tunnel release      right   History reviewed. No pertinent family history. Social History  Substance Use Topics  . Smoking status: Current Every Day Smoker  . Smokeless tobacco: Never Used  . Alcohol Use: No    Review of Systems  Constitutional: Negative for activity change.  Musculoskeletal: Positive for myalgias. Negative for joint swelling, gait problem and neck pain.  Skin: Negative for wound.  Neurological: Negative for weakness and numbness.      Allergies  Review of patient's allergies indicates no known allergies.  Home Medications   Prior to Admission medications   Medication Sig Start Date End Date Taking? Authorizing Provider  amLODipine (NORVASC) 5 MG tablet Take 5 mg by mouth daily.    Historical Provider, MD  ciprofloxacin (CIPRO) 500 MG tablet Take 1 tablet (500 mg total) by mouth 2 (two) times daily. 07/17/13   Kaitlyn Szekalski, PA-C  ibuprofen (ADVIL,MOTRIN) 200 MG tablet Take 400 mg by mouth every 6 (six) hours as needed for moderate pain.     Historical Provider, MD  lisinopril (PRINIVIL,ZESTRIL) 20 MG tablet Take 20 mg by mouth daily.    Historical Provider, MD  metroNIDAZOLE (FLAGYL) 500 MG tablet Take 1 tablet (500 mg total) by mouth 3 (three) times daily. 07/17/13   Kaitlyn Szekalski, PA-C  naproxen (NAPROSYN) 500 MG tablet Take 1 tablet (500 mg total) by mouth 2 (two) times daily. 09/21/14   Charlann Lange, PA-C  omeprazole (PRILOSEC) 20 MG capsule Take 20 mg by mouth daily.      Historical Provider, MD  ondansetron (ZOFRAN ODT) 4 MG disintegrating tablet Take 1 tablet (4 mg total) by mouth every 8 (eight) hours as needed for nausea or vomiting. 07/17/13   Alvina Chou, PA-C  oxyCODONE-acetaminophen (PERCOCET/ROXICET) 5-325 MG per tablet Take 2 tablets by mouth every 4 (four) hours as needed for moderate pain or severe pain. 07/17/13   Kaitlyn Szekalski, PA-C  potassium chloride SA (K-DUR,KLOR-CON) 20 MEQ tablet Take 20 mEq by mouth daily.    Historical Provider, MD   BP 148/92 mmHg  Pulse 71  Temp(Src) 98.3 F (36.8 C) (Oral)  Resp 16  Ht '5\' 11"'$  (1.803 m)  Wt 116.574 kg  BMI 35.86 kg/m2  SpO2 100% Physical Exam  Constitutional: He appears well-developed and well-nourished.  HENT:  Head: Normocephalic and atraumatic.  Eyes: Conjunctivae are normal.  Neck: Normal range of motion. Neck supple.  Cardiovascular:  Pulses:  Dorsalis pedis pulses are 2+ on the right side, and 2+ on the left side.  Pulmonary/Chest: No respiratory distress.  Musculoskeletal:       Right hip: Normal.       Right knee: He exhibits normal range of motion, no swelling and no effusion. Tenderness (popliteal) found.       Right ankle: Normal.       Right upper leg: Normal. He exhibits no tenderness.       Right lower leg: He exhibits no tenderness.       Right foot: Normal.  Neurological: He is alert.  Skin: Skin is warm and dry.  Psychiatric: He has a normal mood and affect.  Nursing note and vitals reviewed.   ED Course  Procedures  (including critical care time) Labs Review Labs Reviewed  BASIC METABOLIC PANEL  [place], ED    Imaging Review No results found. I have personally reviewed and evaluated these images and lab results as part of my medical decision-making.   EKG Interpretation None      Patient seen and examined. Doppler ordered. Labs ordered on arrival to ED, reassuring. DVT study ordered. Signout to [employer] at shift change.    Vital signs reviewed and are as follows: BP 148/92 mmHg  Pulse 71  Temp(Src) 98.3 F (36.8 C) (Oral)  Resp 16  Ht '5\' 11"'$  (1.803 m)  Wt 116.574 kg  BMI 35.86 kg/m2  SpO2 100%    MDM    Pending completion of [place] to r/o DVT.    Carlisle Cater, PA-C 11/25/14 1652  Merrily Pew, MD 11/27/14 [phone]

## 2015-02-28 ENCOUNTER — Emergency Department (HOSPITAL_COMMUNITY): Payer: Medicare HMO

## 2015-02-28 ENCOUNTER — Encounter (HOSPITAL_COMMUNITY): Payer: Self-pay | Admitting: Emergency Medicine

## 2015-02-28 ENCOUNTER — Observation Stay (HOSPITAL_COMMUNITY)
Admission: EM | Admit: 2015-02-28 | Discharge: 2015-03-02 | Disposition: A | Discharge status: Home / Self Care | Payer: Medicare HMO | Attending: Internal Medicine | Admitting: Internal Medicine

## 2015-02-28 ENCOUNTER — Emergency Department (HOSPITAL_BASED_OUTPATIENT_CLINIC_OR_DEPARTMENT_OTHER): Payer: Medicare HMO

## 2015-02-28 DIAGNOSIS — I252 Old myocardial infarction: Secondary | ICD-10-CM | POA: Diagnosis not present

## 2015-02-28 DIAGNOSIS — M1990 Unspecified osteoarthritis, unspecified site: Secondary | ICD-10-CM | POA: Insufficient documentation

## 2015-02-28 DIAGNOSIS — Z9114 Patient's other noncompliance with medication regimen: Secondary | ICD-10-CM | POA: Diagnosis not present

## 2015-02-28 DIAGNOSIS — F17200 Nicotine dependence, unspecified, uncomplicated: Secondary | ICD-10-CM | POA: Diagnosis present

## 2015-02-28 DIAGNOSIS — M7989 Other specified soft tissue disorders: Secondary | ICD-10-CM

## 2015-02-28 DIAGNOSIS — R002 Palpitations: Secondary | ICD-10-CM | POA: Diagnosis not present

## 2015-02-28 DIAGNOSIS — Z23 Encounter for immunization: Secondary | ICD-10-CM | POA: Insufficient documentation

## 2015-02-28 DIAGNOSIS — R0789 Other chest pain: Principal | ICD-10-CM | POA: Insufficient documentation

## 2015-02-28 DIAGNOSIS — R079 Chest pain, unspecified: Secondary | ICD-10-CM | POA: Diagnosis present

## 2015-02-28 DIAGNOSIS — M7121 Synovial cyst of popliteal space [Baker], right knee: Secondary | ICD-10-CM | POA: Insufficient documentation

## 2015-02-28 DIAGNOSIS — M7120 Synovial cyst of popliteal space [Baker], unspecified knee: Secondary | ICD-10-CM | POA: Diagnosis present

## 2015-02-28 DIAGNOSIS — M79609 Pain in unspecified limb: Secondary | ICD-10-CM | POA: Diagnosis not present

## 2015-02-28 DIAGNOSIS — I10 Essential (primary) hypertension: Secondary | ICD-10-CM | POA: Diagnosis not present

## 2015-02-28 DIAGNOSIS — R9431 Abnormal electrocardiogram [ECG] [EKG]: Secondary | ICD-10-CM | POA: Diagnosis present

## 2015-02-28 HISTORY — DX: Diverticulitis of intestine, part unspecified, without perforation or abscess without bleeding: K57.92

## 2015-02-28 HISTORY — DX: Synovial cyst of popliteal space (Baker), unspecified knee: M71.20

## 2015-02-28 LAB — BASIC METABOLIC PANEL
Anion gap: 10 (ref 5–15)
BUN: 11 mg/dL (ref 6–20)
CALCIUM: 8.9 mg/dL (ref 8.9–10.3)
CO2: 23 mmol/L (ref 22–32)
CREATININE: 0.89 mg/dL (ref 0.61–1.24)
Chloride: 108 mmol/L (ref 101–111)
GFR calc non Af Amer: >60 mL/min (ref >60)
Glucose, Bld: 95 mg/dL (ref 65–99)
Potassium: 4.1 mmol/L (ref 3.5–5.1)
SODIUM: 141 mmol/L (ref 135–145)

## 2015-02-28 LAB — CBC WITH DIFFERENTIAL/PLATELET
BASOS PCT: 0 %
Basophils Absolute: 0 10*3/uL (ref 0.0–0.1)
EOS ABS: 0.5 10*3/uL (ref 0.0–0.7)
Eosinophils Relative: 5 %
HCT: 43 % (ref 39.0–52.0)
Hemoglobin: 14 g/dL (ref 13.0–17.0)
LYMPHS ABS: 2.3 10*3/uL (ref 0.7–4.0)
Lymphocytes Relative: 25 %
MCH: 28.3 pg (ref 26.0–34.0)
MCHC: 32.6 g/dL (ref 30.0–36.0)
MCV: 87 fL (ref 78.0–100.0)
MONO ABS: 0.9 10*3/uL (ref 0.1–1.0)
MONOS PCT: 10 %
Neutro Abs: 5.8 10*3/uL (ref 1.7–7.7)
Neutrophils Relative %: 60 %
Platelets: 238 10*3/uL (ref 150–400)
RBC: 4.94 MIL/uL (ref 4.22–5.81)
RDW: 12.9 % (ref 11.5–15.5)
WBC: 9.6 10*3/uL (ref 4.0–10.5)

## 2015-02-28 LAB — I-STAT TROPONIN, ED: TROPONIN I, POC: 0 ng/mL (ref 0.00–0.08)

## 2015-02-28 LAB — TROPONIN I: Troponin I: <0.03 ng/mL (ref <0.031)

## 2015-02-28 MED ORDER — LISINOPRIL 20 MG PO TABS
20.0000 mg | ORAL_TABLET | Freq: Every day | ORAL | Status: DC
  Administered 2015-03-01 – 2015-03-02 (×2): 20 mg via ORAL
  Filled 2015-02-28 (×2): qty 1

## 2015-02-28 MED ORDER — ASPIRIN 81 MG PO CHEW
324.0000 mg | CHEWABLE_TABLET | Freq: Once | ORAL | Status: AC
  Administered 2015-02-28: 324 mg via ORAL
  Filled 2015-02-28: qty 4

## 2015-02-28 MED ORDER — MORPHINE SULFATE (PF) 4 MG/ML IV SOLN
4.0000 mg | INTRAVENOUS | Status: DC | PRN
  Administered 2015-02-28 – 2015-03-02 (×7): 4 mg via INTRAVENOUS
  Filled 2015-02-28 (×7): qty 1

## 2015-02-28 MED ORDER — ENOXAPARIN SODIUM 40 MG/0.4ML ~~LOC~~ SOLN
40.0000 mg | Freq: Every day | SUBCUTANEOUS | Status: DC
Start: 2015-02-28 — End: 2015-03-02
  Administered 2015-02-28 – 2015-03-01 (×2): 40 mg via SUBCUTANEOUS
  Filled 2015-02-28 (×2): qty 0.4

## 2015-02-28 MED ORDER — INFLUENZA VAC SPLIT QUAD 0.5 ML IM SUSY
0.5000 mL | PREFILLED_SYRINGE | INTRAMUSCULAR | Status: AC
  Administered 2015-03-01: 0.5 mL via INTRAMUSCULAR
  Filled 2015-02-28: qty 0.5

## 2015-02-28 MED ORDER — ACETAMINOPHEN 325 MG PO TABS
650.0000 mg | ORAL_TABLET | ORAL | Status: DC | PRN
  Administered 2015-02-28 – 2015-03-01 (×3): 650 mg via ORAL
  Filled 2015-02-28 (×2): qty 2

## 2015-02-28 MED ORDER — IOHEXOL 350 MG/ML SOLN
100.0000 mL | Freq: Once | INTRAVENOUS | Status: AC | PRN
  Administered 2015-02-28: 100 mL via INTRAVENOUS

## 2015-02-28 MED ORDER — PANTOPRAZOLE SODIUM 40 MG PO TBEC
40.0000 mg | DELAYED_RELEASE_TABLET | Freq: Every day | ORAL | Status: DC
  Administered 2015-03-01 – 2015-03-02 (×2): 40 mg via ORAL
  Filled 2015-02-28 (×2): qty 1

## 2015-02-28 MED ORDER — AMLODIPINE BESYLATE 5 MG PO TABS
5.0000 mg | ORAL_TABLET | Freq: Every day | ORAL | Status: DC
  Administered 2015-03-01 – 2015-03-02 (×2): 5 mg via ORAL
  Filled 2015-02-28 (×2): qty 1

## 2015-02-28 MED ORDER — ONDANSETRON HCL 4 MG/2ML IJ SOLN: 4.0000 mg | Freq: Four times a day (QID) | INTRAMUSCULAR | Status: DC | PRN

## 2015-02-28 NOTE — ED Notes (Signed)
Patient transported to CT 

## 2015-02-28 NOTE — ED Notes (Addendum)
Dr. Doutova at bedside.  

## 2015-02-28 NOTE — ED Notes (Signed)
Ordered pt's heart healthy dinner tray.

## 2015-02-28 NOTE — ED Provider Notes (Signed)
CSN: [id number]     Arrival date & time 02/28/15  1236 History   First MD Initiated Contact with Patient 02/28/15 1318     Chief Complaint  Patient presents with  . Leg Pain     (Consider location/radiation/quality/duration/timing/severity/associated sxs/prior Treatment) HPI   Blood pressure 168/95, pulse 80, temperature 99.4 F (37.4 C), temperature source Oral, resp. rate 20, SpO2 97 %.  Raymond [name] is a 55 y.o. male complaining of right lower extremity pain and swelling worsening over the course of 3 weeks. Rates his pain at 7 out of 10, exacerbated by palpation. 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 Patient denies anticoagulation, history of DVT/PE, recent immobilization, active cancer, syncope. States this does feel like prior MI. He has been noncompliant with his medications does not follow with cardiology and has not seen his primary care doctor in 2 years, he is an active daily smoker.  Past Medical History  Diagnosis Date  . Heart attack ([place])   . Arthritis   . Hypertension    Past Surgical History  Procedure Laterality Date  . Knee surgery      left  . Hand surgery      left  . Carpal tunnel release      right   No family history on file. Social History  Substance Use Topics  . Smoking status: Current Every Day Smoker  . Smokeless tobacco: Never Used  . Alcohol Use: No    Review of Systems  10 systems reviewed and found to be negative, except as noted in the HPI.   Allergies  Review of patient's allergies indicates no known allergies.  Home Medications   Prior to Admission medications   Medication Sig Start Date End Date Taking? Authorizing Provider  amLODipine (NORVASC) 5 MG tablet Take 5  mg by mouth daily.    Historical Provider, MD  ciprofloxacin (CIPRO) 500 MG tablet Take 1 tablet (500 mg total) by mouth 2 (two) times daily. 07/17/13   Kaitlyn Szekalski, PA-C  ibuprofen (ADVIL,MOTRIN) 200 MG tablet Take 400 mg by mouth every 6 (six) hours as needed for moderate pain.    Historical Provider, MD  lisinopril (PRINIVIL,ZESTRIL) 20 MG tablet Take 20 mg by mouth daily.    Historical Provider, MD  metroNIDAZOLE (FLAGYL) 500 MG tablet Take 1 tablet (500 mg total) by mouth 3 (three) times daily. 07/17/13   Kaitlyn Szekalski, PA-C  naproxen (NAPROSYN) 500 MG tablet Take 1 tablet (500 mg total) by mouth 2 (two) times daily. 09/21/14   Charlann Lange, PA-C  omeprazole (PRILOSEC) 20 MG capsule Take 20 mg by mouth daily.      Historical Provider, MD  ondansetron (ZOFRAN ODT) 4 MG disintegrating tablet Take 1 tablet (4 mg total) by mouth every 8 (eight) hours as needed for nausea or vomiting. 07/17/13   Alvina Chou, PA-C  oxyCODONE-acetaminophen (PERCOCET/ROXICET) 5-325 MG per tablet Take 2 tablets by mouth every 4 (four) hours as needed for moderate pain or severe pain. 07/17/13   Kaitlyn Szekalski, PA-C  potassium chloride SA (K-DUR,KLOR-CON) 20 MEQ tablet Take 20 mEq by mouth daily.    Historical Provider, MD   BP 168/95 mmHg  Pulse 80  Temp(Src) 99.4 F (37.4 C) (Oral)  Resp 20  SpO2  97% Physical Exam  Constitutional: He is oriented to person, place, and time. He appears well-developed and well-nourished. No distress.  HENT:  Head: Normocephalic.  Mouth/Throat: Oropharynx is clear and moist.  Eyes: Conjunctivae are normal.  Neck: Normal range of motion. No JVD present. No tracheal deviation present.  Cardiovascular: Normal rate, regular rhythm and intact distal pulses.   Radial pulse equal bilaterally  Pulmonary/Chest: Effort normal and breath sounds normal. No stridor. No respiratory distress. He has no wheezes. He has no rales. He exhibits no tenderness.  Abdominal: Soft. He  exhibits no distension and no mass. There is no tenderness. There is no rebound and no guarding.  Musculoskeletal: Normal range of motion. He exhibits edema and tenderness.  Right lower extremity with 1+ pitting edema, right lower extremity larger than left lower extremity, positive Homans sign, no superficial collaterals are palpable cords. No warmth.   Neurological: He is alert and oriented to person, place, and time.  Skin: Skin is warm. He is not diaphoretic.  Psychiatric: He has a normal mood and affect.  Nursing note and vitals reviewed.   ED Course  Procedures (including critical care time) Labs Review Labs Reviewed  CBC WITH DIFFERENTIAL/PLATELET  BASIC METABOLIC PANEL  I-STAT [place], ED    Imaging Review No results found. I have personally reviewed and evaluated these images and lab results as part of my medical decision-making.   EKG Interpretation   Date/Time:  Saturday February 28 2015 13:44:57 EST Ventricular Rate:  71 PR Interval:  172 QRS Duration: 87 QT Interval:  379 QTC Calculation: 412 R Axis:   5 Text Interpretation:  Sinus rhythm Borderline T abnormalities, inferior  leads Confirmed by DELO  MD, DOUGLAS ([id number]) on 02/28/2015 2:39:43 PM      MDM   Final diagnoses:  Chest pain  Baker's cyst, right    Filed Vitals:   02/28/15 1400 02/28/15 1512 02/28/15 1513 02/28/15 1514  BP: 143/89 145/79    Pulse: 70  67   Temp:      TempSrc:      Resp: 15   11  SpO2: 99%  98%     Medications  morphine 4 MG/ML injection 4 mg (4 mg Intravenous Given 02/28/15 1414)  aspirin chewable tablet 324 mg (324 mg Oral Given 02/28/15 1413)    Raymond [name] is 55 y.o. male presenting with right leg swelling, chest pain, shortness of breath and palpitations. No history of DVT or PE. Patient is not anticoagulated.   EKG with borderline T abnormalities in the inferior leads, troponin negative. Considering patient's noncompliance and risk factors she will need  admission for chest pain. His moderate risk by heart score. Patient is pending CT chest and right lower extremity ultrasound.   Verbal report from vascular tech shows a new Baker's cyst on the left.   Pending CTA, Case signed out to Dr. Kathrynn Humble at shift change: Plan is to follow-up CT and admit for concerning chest pain with moderate risk heart score      Monico Blitz, PA-C 02/28/15 [place], MD 02/28/15 7939

## 2015-02-28 NOTE — H&P (Signed)
PCP:  Benito Mccreedy, MD    Referring provider Nanavati   Chief Complaint: Chest pain  HPI: Raymond [name] is a 55 y.o. male   has a past medical history of Heart attack ([place]); Arthritis; Hypertension; and Baker's cyst.   Presented with right leg pain and cramping and wormness with  for past 2 weeks.  While in ER he also reported chest pain that is worse with exertion. For the past weeks he has been having intermittent episodes pain all over going to the back of his head. The pain was in his chest that stops him and forces him to rest.  He has been working 12 hour shifts. He reports not having enough sleep and overexertion.  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 He has been drinking  A lot of energy drinks in particular RIP-IT. He has been having some palpitations.  Chest pain radiates to his neck and back he presented to emergency room.   IN ER: Dopplers were done showing no evidence of DVT but  there is evidence of Baker's cyst CT angiogram of the chest was done showing no evidence of pulmonary embolism some reactive mediastinal lymph nodes otherwise unremarkable. Troponin 0.00  Regarding pertinent past history: He was told he had an MI in 2006, per records had NM stress test no evidence of ischemia in 2007  Hospitalist was called for admission for exertional chest pain   Review of Systems:    Pertinent positives include: chest pain, right leg pain  Constitutional:  No weight loss, night sweats, Fevers, chills, fatigue, weight loss  HEENT:  No headaches, Difficulty swallowing,Tooth/dental problems,Sore throat,  No sneezing, itching, ear ache, nasal congestion, post nasal drip,  Cardio-vascular:    Orthopnea, PND, anasarca, dizziness, palpitations.no Bilateral lower extremity swelling  GI:  No heartburn, indigestion, abdominal pain, nausea, vomiting, diarrhea, change in bowel habits, loss of appetite, melena,  blood in stool, hematemesis Resp:  no shortness of breath at rest. No dyspnea on exertion, No excess mucus, no productive cough, No non-productive cough, No coughing up of blood.No change in color of mucus.No wheezing. Skin:  no rash or lesions. No jaundice GU:  no dysuria, change in color of urine, no urgency or frequency. No straining to urinate.  No flank pain.  Musculoskeletal:  No joint pain or no joint swelling. No decreased range of motion. No back pain.  Psych:  No change in mood or affect. No depression or anxiety. No memory loss.  Neuro: no localizing neurological complaints, no tingling, no weakness, no double vision, no gait abnormality, no slurred speech, no confusion  Otherwise ROS are negative except for above, 10 systems were reviewed  Past Medical History: Past Medical History  Diagnosis Date  . Heart attack ([place])   . Arthritis   . Hypertension   . Baker's cyst     right   Past Surgical History  Procedure Laterality Date  . Knee surgery      left  . Hand surgery      left  . Carpal tunnel release      right     Medications: Prior to Admission medications   Medication Sig Start Date End Date Taking? Authorizing Provider  aspirin-acetaminophen-caffeine (EXCEDRIN MIGRAINE) [phone] MG tablet Take 1 tablet by mouth every 6 (six) hours as needed for headache.   Yes Historical Provider, MD  amLODipine (NORVASC) 5 MG tablet Take 5 mg by mouth  daily. Reported on 02/28/2015    Historical Provider, MD  ciprofloxacin (CIPRO) 500 MG tablet Take 1 tablet (500 mg total) by mouth 2 (two) times daily. 07/17/13   Kaitlyn Szekalski, PA-C  lisinopril (PRINIVIL,ZESTRIL) 20 MG tablet Take 20 mg by mouth daily. Reported on 02/28/2015    Historical Provider, MD  metroNIDAZOLE (FLAGYL) 500 MG tablet Take 1 tablet (500 mg total) by mouth 3 (three) times daily. 07/17/13   Kaitlyn Szekalski, PA-C  naproxen (NAPROSYN) 500 MG tablet Take 1 tablet (500 mg total) by mouth 2 (two) times  daily. 09/21/14   Charlann Lange, PA-C  omeprazole (PRILOSEC) 20 MG capsule Take 20 mg by mouth daily. Reported on 02/28/2015    Historical Provider, MD  ondansetron (ZOFRAN ODT) 4 MG disintegrating tablet Take 1 tablet (4 mg total) by mouth every 8 (eight) hours as needed for nausea or vomiting. 07/17/13   Alvina Chou, PA-C  oxyCODONE-acetaminophen (PERCOCET/ROXICET) 5-325 MG per tablet Take 2 tablets by mouth every 4 (four) hours as needed for moderate pain or severe pain. 07/17/13   Kaitlyn Szekalski, PA-C  potassium chloride SA (K-DUR,KLOR-CON) 20 MEQ tablet Take 20 mEq by mouth daily. Reported on 02/28/2015    Historical Provider, MD    Allergies:  No Known Allergies  Social History:  Ambulatory   independently   Lives at home  With family     reports that he has been smoking.  He has never used smokeless tobacco. He reports that he does not drink alcohol or use illicit drugs.     Family History: family history includes Heart failure in his sister; Lung cancer in his mother.    Physical Exam: Patient Vitals for the past 24 hrs:  BP Temp Temp src Pulse Resp SpO2  02/28/15 1530 137/89 mmHg - - (!) 58 13 99 %  02/28/15 1514 - - - - 11 -  02/28/15 1513 - - - 67 - 98 %  02/28/15 1512 145/79 mmHg - - - - -  02/28/15 1400 143/89 mmHg - - 70 15 99 %  02/28/15 1243 168/95 mmHg 99.4 F (37.4 C) Oral 80 20 97 %    1. General:  in No Acute distress 2. Psychological: Alert and  Oriented 3. Head/ENT:   Moist Mucous Membranes                          Head Non traumatic, neck supple                          Normal   Dentition 4. SKIN: normal  Skin turgor,  Skin clean Dry and intact no rash 5. Heart: Regular rate and rhythm no  Murmur, Rub or gallop 6. Lungs:  Clear to auscultation bilaterally, no wheezes or crackles   7. Abdomen: Soft, non-tender, Non distended 8. Lower extremities: no clubbing, cyanosis, or edema 9. Neurologically Grossly intact, moving all 4 extremities  equally 10. MSK: Normal range of motion  body mass index is unknown because there is no weight on file.   Labs on Admission:   Results for orders placed or performed during the hospital encounter of 02/28/15 (from the past 24 hour(s))  CBC with Differential     Status: None   Collection Time: 02/28/15  2:27 PM  Result Value Ref Range   WBC 9.6 4.0 - 10.5 K/uL   RBC 4.94 4.22 - 5.81 MIL/uL   Hemoglobin 14.0 13.0 -  17.0 g/dL   HCT 43.0 39.0 - 52.0 %   MCV 87.0 78.0 - 100.0 fL   MCH 28.3 26.0 - 34.0 pg   MCHC 32.6 30.0 - 36.0 g/dL   RDW 12.9 11.5 - 15.5 %   Platelets 238 150 - 400 K/uL   Neutrophils Relative % 60 %   Neutro Abs 5.8 1.7 - 7.7 K/uL   Lymphocytes Relative 25 %   Lymphs Abs 2.3 0.7 - 4.0 K/uL   Monocytes Relative 10 %   Monocytes Absolute 0.9 0.1 - 1.0 K/uL   Eosinophils Relative 5 %   Eosinophils Absolute 0.5 0.0 - 0.7 K/uL   Basophils Relative 0 %   Basophils Absolute 0.0 0.0 - 0.1 K/uL  Basic metabolic panel     Status: None   Collection Time: 02/28/15  2:27 PM  Result Value Ref Range   Sodium 141 135 - 145 mmol/L   Potassium 4.1 3.5 - 5.1 mmol/L   Chloride 108 101 - 111 mmol/L   CO2 23 22 - 32 mmol/L   Glucose, Bld 95 65 - 99 mg/dL   BUN 11 6 - 20 mg/dL   Creatinine, Ser 0.89 0.61 - 1.24 mg/dL   Calcium 8.9 8.9 - 10.3 mg/dL   GFR calc non Af Amer >60 >60 mL/min   GFR calc Af Amer >60 >60 mL/min   Anion gap 10 5 - 15  I-stat troponin, ED     Status: None   Collection Time: 02/28/15  2:36 PM  Result Value Ref Range   Troponin i, poc 0.00 0.00 - 0.08 ng/mL   Comment 3            UA Not obtained  No results found for: HGBA1C  CrCl cannot be calculated (Unknown ideal weight.).  BNP (last 3 results) No results for input(s): PROBNP in the last 8760 hours.  Other results:  I have pearsonaly reviewed this: ECG REPORT  Rate: 71  Rhythm: SR ST&T Change: ST depressions in inferior leads QTC 412  There were no vitals filed for this  visit.   Cultures:    Component Value Date/Time   SDES ABSCESS ABDOMEN 01/26/2009 0113   SPECREQUEST  01/26/2009 0113    Multiple Boils/Abscess Vancomycin, Clindamycin IMMUNE:NORM   CULT NO GROWTH 3 DAYS 01/26/2009 0113   REPTSTATUS 01/29/2009 FINAL 01/26/2009 0113     Radiological Exams on Admission: Ct Angio Chest Pe W/cm &/or Wo Cm  02/28/2015  CLINICAL DATA:  Left-sided chest pain, shortness of breath for 2 weeks EXAM: CT ANGIOGRAPHY CHEST WITH CONTRAST TECHNIQUE: Multidetector CT imaging of the chest was performed using the standard protocol during bolus administration of intravenous contrast. Multiplanar CT image reconstructions and MIPs were obtained to evaluate the vascular anatomy. CONTRAST:  168m OMNIPAQUE IOHEXOL 350 MG/ML SOLN COMPARISON:  None. FINDINGS: No confluent airspace opacities within the lungs. Areas of scarring in the lungs peripherally in the upper lobes and lung bases. No effusions. No filling defects in the pulmonary arteries to suggest pulmonary emboli. Heart is upper limits normal in size. Aorta is normal caliber. Small scattered mediastinal lymph nodes borderline in size. No axillary or hilar adenopathy. Chest wall soft tissues are unremarkable. Imaging into the upper abdomen shows no acute findings. No acute bone abnormality focal bone lesion. Review of the MIP images confirms the above findings. IMPRESSION: No evidence of pulmonary embolus. Areas of scarring peripherally in the lungs. Small and borderline sized mediastinal lymph nodes, likely reactive. No acute findings in  the chest. Electronically Signed   By: Rolm Baptise M.D.   On: 02/28/2015 16:18    Chart has been reviewed  Family   at  Bedside  plan of care was discussed with  Wife Shery A Lee-Dinger  Assessment/Plan  55 year old gentleman with hx of HTN here with leg pain which was diagnosed as baker cyst and exertional chest pain.   Present on Admission:   . CHEST PAIN - given risk factors will  admit, monitor on telemetry, cycle cardiac enzymes, obtain serial ECG. Further risk stratify with lipid panel, hgA1C, obtain TSH. Make sure patient is on Aspirin. Further treatment based on the currently pending results.  . Baker's cyst -  Orthopedics consult in AM, rest, elevate leg . Abnormal EKG - will obtain serial EKG cardiology consult in a.m. or sooner if decompensates currently chest pain-free obtain serial troponins echogram in the morning . HYPERTENSION, BENIGN continue home medications  Prophylaxis:   Lovenox   CODE STATUS:  FULL CODE   as per patient    Disposition:    To home once workup is complete and patient is stable  Other plan as per orders.  I have spent a total of 56 min on this admission    Zachory Mangual 02/28/2015, 8:21 PM    [facility]  Pager [phone]   after 2 AM please page floor coverage PA If 7AM-7PM, please contact the day team taking care of the patient  [email or web address]  Password [email or web address]

## 2015-02-28 NOTE — ED Notes (Signed)
Dr. Nanavati at bedside 

## 2015-02-28 NOTE — ED Notes (Signed)
Pt reports had a cramp to right leg 2 weeks ago that has not gone away. Pt reports feeling heat in right calf area.

## 2015-02-28 NOTE — ED Notes (Signed)
Per Md pt able to eat; Dinner tray being ordered

## 2015-02-28 NOTE — Progress Notes (Signed)
VASCULAR LAB PRELIMINARY  PRELIMINARY  PRELIMINARY  PRELIMINARY  Right lower extremity venous duplex completed.    Preliminary report:  Right:  No evidence of DVTor superficial thrombosis. There is a fluid filled space in the proximal medial calf suggestive of a  Baker's cyst.  Raymond [name], RVS 02/28/2015, 3:22 PM

## 2015-02-28 NOTE — ED Notes (Signed)
Pt returned to room from vascular studies.

## 2015-03-01 ENCOUNTER — Observation Stay (HOSPITAL_COMMUNITY): Payer: Medicare Other

## 2015-03-01 ENCOUNTER — Observation Stay (HOSPITAL_COMMUNITY): Payer: Medicare HMO

## 2015-03-01 DIAGNOSIS — R079 Chest pain, unspecified: Secondary | ICD-10-CM | POA: Diagnosis not present

## 2015-03-01 DIAGNOSIS — I252 Old myocardial infarction: Secondary | ICD-10-CM | POA: Diagnosis not present

## 2015-03-01 DIAGNOSIS — R0789 Other chest pain: Secondary | ICD-10-CM | POA: Diagnosis not present

## 2015-03-01 DIAGNOSIS — M7121 Synovial cyst of popliteal space [Baker], right knee: Secondary | ICD-10-CM | POA: Diagnosis not present

## 2015-03-01 DIAGNOSIS — F17200 Nicotine dependence, unspecified, uncomplicated: Secondary | ICD-10-CM | POA: Diagnosis not present

## 2015-03-01 LAB — NM MYOCAR MULTI W/SPECT W/WALL MOTION / EF
CHL CUP RESTING HR STRESS: 66 {beats}/min
CSEPED: 0 min
CSEPEDS: 0 s
CSEPEW: 1 METS
CSEPPHR: 89 {beats}/min
MPHR: 168 {beats}/min
Percent HR: 52 %

## 2015-03-01 LAB — LIPID PANEL
Cholesterol: 158 mg/dL (ref 0–200)
HDL: 31 mg/dL — ABNORMAL LOW (ref >40)
LDL CALC: 98 mg/dL (ref 0–99)
Total CHOL/HDL Ratio: 5.1 RATIO
Triglycerides: 146 mg/dL (ref <150)
VLDL: 29 mg/dL (ref 0–40)

## 2015-03-01 LAB — TROPONIN I

## 2015-03-01 LAB — TSH: TSH: 2.305 u[IU]/mL (ref 0.350–4.500)

## 2015-03-01 MED ORDER — TECHNETIUM TC 99M SESTAMIBI GENERIC - CARDIOLITE
30.0000 | Freq: Once | INTRAVENOUS | Status: AC | PRN
  Administered 2015-03-01: 30 via INTRAVENOUS

## 2015-03-01 MED ORDER — REGADENOSON 0.4 MG/5ML IV SOLN
0.4000 mg | Freq: Once | INTRAVENOUS | Status: AC
  Administered 2015-03-01: 0.4 mg via INTRAVENOUS
  Filled 2015-03-01: qty 5

## 2015-03-01 MED ORDER — REGADENOSON 0.4 MG/5ML IV SOLN
INTRAVENOUS | Status: AC
  Administered 2015-03-01: 0.4 mg via INTRAVENOUS
  Filled 2015-03-01: qty 5

## 2015-03-01 MED ORDER — TECHNETIUM TC 99M SESTAMIBI GENERIC - CARDIOLITE
10.0000 | Freq: Once | INTRAVENOUS | Status: AC | PRN
  Administered 2015-03-01: 10 via INTRAVENOUS

## 2015-03-01 MED ORDER — ASPIRIN 81 MG PO CHEW
81.0000 mg | CHEWABLE_TABLET | Freq: Once | ORAL | Status: AC
  Administered 2015-03-01: 81 mg via ORAL
  Filled 2015-03-01: qty 1

## 2015-03-01 MED ORDER — ACETAMINOPHEN 325 MG PO TABS
ORAL_TABLET | ORAL | Status: AC
  Filled 2015-03-01: qty 2

## 2015-03-01 NOTE — Progress Notes (Signed)
5 min, [place] PA at bedside. Pt reports SOB and CP has resolved.  Lexiscan completed

## 2015-03-01 NOTE — Progress Notes (Signed)
Seen in Nuclear Medicine for 1-day NST. Reported having a headache prior to the test. PRN Tylenol administered. The patient reported having a history of cluster headaches so 2L [place] oxygen was applied prior to testing as well. He reported his headache was completely resolved within 5 minutes of application of the oxygen.   Official Read pending by [facility] following stress images.  Signed, Erma Heritage, PA-C 03/01/2015, 12:08 PM Pager: [phone]

## 2015-03-01 NOTE — Progress Notes (Signed)
3 min, [place] PA at bedside. Pt reports SOB is "getting better" Pt continues to deny CP.

## 2015-03-01 NOTE — Progress Notes (Signed)
[facility]                                                                              Patient Demographics  Raymond [name], is a 56 y.o. male, DOB - [birth year], DUK:[id number]  Admit date - 02/28/2015   Admitting Physician Toy Baker, MD  Outpatient Primary MD for the patient is OSEI-BONSU,GEORGE, MD  LOS -    Chief Complaint  Patient presents with  . Leg Pain      HPI on 02/28/15 by Dr. Toy Baker Presented with right leg pain and cramping and wormness with for past 2 weeks. While in ER he also reported chest pain that is worse with exertion. For the past weeks he has been having intermittent episodes pain all over going to the back of his head. The pain was in his chest that stops him and forces him to rest. He has been working 12 hour shifts. He reports not having enough sleep and overexertion. 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 He has been drinking A lot of energy drinks in particular RIP-IT. He has been having some palpitations. Chest pain radiates to his neck and back he presented to emergency room.  IN ER: Dopplers were done showing no evidence of DVT but there is evidence of Baker's cyst CT angiogram of the chest was done showing no evidence of pulmonary embolism some reactive mediastinal lymph nodes otherwise unremarkable. Troponin 0.00 Regarding pertinent past history: He was told he had an MI in 2006, per records had NM stress test no evidence of ischemia in 2007 Hospitalist was called for admission for exertional chest pain   Assessment & Plan   Atypical chest pain -Troponins cycled and negative 3 -Continue aspirin -Discussed smoking cessation -Fasting lipid panel: TC 158, triglycerides 146, HDL 31, LDL 98 -Echocardiogram and stress test pending -Cardiac consultation appreciated -CTA chest: No evidence of PE  Palpitations -Likely secondary to energy drink -TSH  2.3  Right leg pain and cramping -Right lower extremity Doppler negative for DVT or superficial thrombosis. Possible Baker's cyst.  Essential hypertension -Continue amlodipine, lisinopril  Code Status: Full  Family Communication: Wife at bedside  Disposition Plan: Admitted for observation. Pending echocardiogram, stress test  Time Spent in minutes   30 minutes  Procedures  None  Consults   Cardiology  DVT Prophylaxis  Lovenox  Lab Results  Component Value Date   PLT 238 02/28/2015    Medications  Scheduled Meds: . acetaminophen      . amLODipine  5 mg Oral Daily  . enoxaparin (LOVENOX) injection  40 mg Subcutaneous QHS  . Influenza vac split quadrivalent PF  0.5 mL Intramuscular Tomorrow-1000  . lisinopril  20 mg Oral Daily  . pantoprazole  40 mg Oral Daily  . regadenoson  0.4 mg Intravenous Once   Continuous Infusions:  PRN Meds:.acetaminophen, morphine injection, ondansetron (ZOFRAN) IV  Antibiotics    Anti-infectives    None      Subjective:   Lovie Chol seen and examined today.  Patient denies further chest pain at this time.  Complains of headache. Denies shortness of breath, abdominal  pain, dizziness.   Objective:   Filed Vitals:   02/28/15 2000 02/28/15 2100 02/28/15 2152 03/01/15 0522  BP: 152/83 150/88 186/93 141/74  Pulse: 61 57 62 71  Temp:   98.3 F (36.8 C) 97.5 F (36.4 C)  TempSrc:   Oral Oral  Resp: 0 '14 18 18  '$ Height:   '5\' 11"'$  (1.803 m)   Weight:   123.2 kg (271 lb 9.7 oz) 123.378 kg (272 lb)  SpO2: 98% 97% 98% 98%    Wt Readings from Last 3 Encounters:  03/01/15 123.378 kg (272 lb)  09/21/14 116.574 kg (257 lb)  07/17/13 122.471 kg (270 lb)     Intake/Output Summary (Last 24 hours) at 03/01/15 1201 Last data filed at 03/01/15 1045  Gross per 24 hour  Intake      0 ml  Output   1100 ml  Net  -1100 ml    Exam  General: Well developed, well nourished, NAD, appears stated age  HEENT: NCAT, mucous membranes  moist.   Cardiovascular: S1 S2 auscultated, no rubs, murmurs or gallops. Regular rate and rhythm.  Respiratory: Clear to auscultation bilaterally with equal chest rise  Abdomen: Soft, obese, nontender, nondistended, + bowel sounds  Extremities: warm dry without cyanosis clubbing or edema  Neuro: AAOx3, nonfocal  Psych: Normal affect and demeanor   Data Review   Micro Results No results found for this or any previous visit (from the past 240 hour(s)).  Radiology Reports Ct Angio Chest Pe W/cm &/or Wo Cm  02/28/2015  CLINICAL DATA:  Left-sided chest pain, shortness of breath for 2 weeks EXAM: CT ANGIOGRAPHY CHEST WITH CONTRAST TECHNIQUE: Multidetector CT imaging of the chest was performed using the standard protocol during bolus administration of intravenous contrast. Multiplanar CT image reconstructions and MIPs were obtained to evaluate the vascular anatomy. CONTRAST:  113m OMNIPAQUE IOHEXOL 350 MG/ML SOLN COMPARISON:  None. FINDINGS: No confluent airspace opacities within the lungs. Areas of scarring in the lungs peripherally in the upper lobes and lung bases. No effusions. No filling defects in the pulmonary arteries to suggest pulmonary emboli. Heart is upper limits normal in size. Aorta is normal caliber. Small scattered mediastinal lymph nodes borderline in size. No axillary or hilar adenopathy. Chest wall soft tissues are unremarkable. Imaging into the upper abdomen shows no acute findings. No acute bone abnormality focal bone lesion. Review of the MIP images confirms the above findings. IMPRESSION: No evidence of pulmonary embolus. Areas of scarring peripherally in the lungs. Small and borderline sized mediastinal lymph nodes, likely reactive. No acute findings in the chest. Electronically Signed   By: KRolm BaptiseM.D.   On: 02/28/2015 16:18    CBC  Recent Labs Lab 02/28/15 1427  WBC 9.6  HGB 14.0  HCT 43.0  PLT 238  MCV 87.0  MCH 28.3  MCHC 32.6  RDW 12.9  LYMPHSABS 2.3   MONOABS 0.9  EOSABS 0.5  BASOSABS 0.0    Chemistries   Recent Labs Lab 02/28/15 1427  NA 141  K 4.1  CL 108  CO2 23  GLUCOSE 95  BUN 11  CREATININE 0.89  CALCIUM 8.9   ------------------------------------------------------------------------------------------------------------------ estimated creatinine clearance is 129.8 mL/min (by C-G formula based on Cr of 0.89). ------------------------------------------------------------------------------------------------------------------ No results for input(s): HGBA1C in the last 72 hours. ------------------------------------------------------------------------------------------------------------------  Recent Labs  03/01/15 0507  CHOL 158  HDL 31*  LDLCALC 98  TRIG 146  CHOLHDL 5.1   ------------------------------------------------------------------------------------------------------------------  Recent Labs  03/01/15 0213  TSH  2.305   ------------------------------------------------------------------------------------------------------------------ No results for input(s): VITAMINB12, FOLATE, FERRITIN, TIBC, IRON, RETICCTPCT in the last 72 hours.  Coagulation profile No results for input(s): INR, PROTIME in the last 168 hours.  No results for input(s): DDIMER in the last 72 hours.  Cardiac Enzymes  Recent Labs Lab 02/28/15 2300 03/01/15 0213 03/01/15 0507  TROPONINI <0.03 <0.03 <0.03   ------------------------------------------------------------------------------------------------------------------ Invalid input(s): POCBNP    Dondra Rhett D.O. on 03/01/2015 at 12:01 PM  Between 7am to 7pm - Pager - [phone]  After 7pm go to [email or web address] - password [email or web address]  And look for the night coverage person covering for me after hours  [facility] Office  [phone]

## 2015-03-01 NOTE — Progress Notes (Signed)
Pt reports a headache, pt reports pain 10/10, pt states "It feels like someone is putting a poker iron through my right eye." [place] PA made aware, verbal order to administer Tylenol 650 mg for headache given.    Babs Bertin RN

## 2015-03-01 NOTE — Progress Notes (Signed)
1 min, [place] PA at bedside. Pt reports SOB, denies CP

## 2015-03-01 NOTE — Progress Notes (Signed)
Pt reports headache relieved. Pt reports pain 0/10. [place] PA at bedside gave a verbal order to place pt on O2 via [employer] for cluster headaches 1200. Pt reports O2 resolved his headache    Babs Bertin RN

## 2015-03-01 NOTE — Progress Notes (Addendum)
Pt sleeping, pt reports headache remains 10/10.  Babs Bertin RN

## 2015-03-02 ENCOUNTER — Observation Stay (HOSPITAL_BASED_OUTPATIENT_CLINIC_OR_DEPARTMENT_OTHER): Payer: Medicare HMO

## 2015-03-02 DIAGNOSIS — I252 Old myocardial infarction: Secondary | ICD-10-CM | POA: Diagnosis not present

## 2015-03-02 DIAGNOSIS — M7121 Synovial cyst of popliteal space [Baker], right knee: Secondary | ICD-10-CM | POA: Diagnosis not present

## 2015-03-02 DIAGNOSIS — R079 Chest pain, unspecified: Secondary | ICD-10-CM | POA: Diagnosis not present

## 2015-03-02 DIAGNOSIS — F17200 Nicotine dependence, unspecified, uncomplicated: Secondary | ICD-10-CM | POA: Diagnosis not present

## 2015-03-02 DIAGNOSIS — R0789 Other chest pain: Secondary | ICD-10-CM | POA: Diagnosis not present

## 2015-03-02 LAB — HEMOGLOBIN A1C
Hgb A1c MFr Bld: 5.7 % — ABNORMAL HIGH (ref 4.8–5.6)
Mean Plasma Glucose: 117 mg/dL

## 2015-03-02 MED ORDER — LISINOPRIL 20 MG PO TABS: 20.0000 mg | ORAL_TABLET | Freq: Every day | ORAL | Status: DC

## 2015-03-02 MED ORDER — AMLODIPINE BESYLATE 5 MG PO TABS: 5.0000 mg | ORAL_TABLET | Freq: Every day | ORAL | Status: DC

## 2015-03-02 MED ORDER — OMEPRAZOLE 20 MG PO CPDR: 20.0000 mg | DELAYED_RELEASE_CAPSULE | Freq: Every day | ORAL | Status: DC

## 2015-03-02 NOTE — Progress Notes (Signed)
CM Consult  CM talked to patient about being compliant with medication. Spouse at bedside stated that he was not taking his medication due to religious reasons. (Jesus will heal him of his health problems). CM talked to patient about how important his beliefs are and to exercise his spiritual discernment regarding his health. Patient was receptive of conversation and stated that he will take his medication as prescribed and do the things that he needs to do to get better. Mindi Slicker [facility] [phone]

## 2015-03-02 NOTE — Care Management Obs Status (Signed)
[name] NOTIFICATION   Patient Details  Name: Raymond [name] MRN: [id number] Date of Birth: Nov 29, 1960   Medicare Observation Status Notification Given:  Yes    Royston Bake, RN 03/02/2015, 11:11 AM

## 2015-03-02 NOTE — Progress Notes (Signed)
[place] as ordered.

## 2015-03-02 NOTE — Progress Notes (Signed)
1500 discharge instructions given to pt  And spouse.Verbalized understanding. Wheeled to lobby by NT

## 2015-03-02 NOTE — Progress Notes (Signed)
  Echocardiogram 2D Echocardiogram has been performed.  Darlina Sicilian M 03/02/2015, 1:27 PM

## 2015-03-02 NOTE — Discharge Summary (Signed)
Physician Discharge Summary  Raymond [name] KGM:[id number] DOB: Mar 12, 1960 DOA: 02/28/2015  PCP: Benito Mccreedy, MD  Admit date: 02/28/2015 Discharge date: 03/02/2015  Time spent: 45 minutes  Recommendations for Outpatient Follow-up:  Patient will be discharged to home.  Patient will need to follow up with primary care provider within one week of discharge.  Patient should continue medications as prescribed.  Patient should follow a heart healthy diet.   Discharge Diagnoses:  Atypical chest pain Palpitations Right leg pain and cramping/Baker's cyst Essential hypertension  Discharge Condition: Stable  Diet recommendation: heart healthy  Filed Weights   02/28/15 2152 03/01/15 0522 03/02/15 0326  Weight: 123.2 kg (271 lb 9.7 oz) 123.378 kg (272 lb) 122.471 kg (270 lb)    History of present illness:  on 02/28/15 by Dr. Toy Baker Presented with right leg pain and cramping and wormness with for past 2 weeks. While in ER he also reported chest pain that is worse with exertion. For the past weeks he has been having intermittent episodes pain all over going to the back of his head. The pain was in his chest that stops him and forces him to rest. He has been working 12 hour shifts. He reports not having enough sleep and overexertion. 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 He has been drinking A lot of energy drinks in particular RIP-IT. He has been having some palpitations. Chest pain radiates to his neck and back he presented to emergency room.  IN ER: Dopplers were done showing no evidence of DVT but there is evidence of Baker's cyst CT angiogram of the chest was done showing no evidence of pulmonary embolism some reactive mediastinal lymph nodes otherwise unremarkable. Troponin 0.00 Regarding pertinent past history: He was told he had an MI in 2006, per records had NM stress test no evidence of ischemia in  2007 Hospitalist was called for admission for exertional chest pain   Hospital Course:  Atypical chest pain -Troponins cycled and negative 3 -Continue aspirin -Discussed smoking cessation -Fasting lipid panel: TC 158, triglycerides 146, HDL 31, LDL 98 -Echocardiogram: EF 55-60%, no regional wall motion abnormalities  -Stress test: low risk, EF 50%, no reversible ischemia or infarction -Cardiac consultation appreciated -CTA chest: No evidence of PE  Palpitations -Likely secondary to energy drink -TSH 2.3  Right leg pain and cramping -Right lower extremity Doppler negative for DVT or superficial thrombosis. Possible Baker's cyst. -Follow up with PCP  Essential hypertension -Continue amlodipine, lisinopril  Procedures  Echocardiogram [place]  [facility]  Discharge Exam: Filed Vitals:   03/02/15 0326 03/02/15 1047  BP: 144/68 149/85  Pulse: 75   Temp: 97.7 F (36.5 C)   Resp: 18      General: Well developed, well nourished, NAD, appears stated age  HEENT: NCAT, PERRLA, EOMI, Anicteic Sclera, mucous membranes moist.  Neck: Supple, no JVD, no masses  Cardiovascular: S1 S2 auscultated, no rubs, murmurs or gallops. Regular rate and rhythm.  Respiratory: Clear to auscultation bilaterally with equal chest rise  Abdomen: Soft, nontender, nondistended, + bowel sounds  Extremities: warm dry without cyanosis clubbing or edema  Neuro: AAOx3, cranial nerves grossly intact. Strength 5/5 in patient's upper and lower extremities bilaterally  Skin: Without rashes exudates or nodules  Psych: Normal affect and demeanor with intact judgement and insight  Discharge Instructions      Discharge Instructions    Discharge instructions    Complete by:  As directed   Patient will be discharged to home.  Patient will need to follow up with primary care provider within one week of discharge.  Patient should continue medications as prescribed.  Patient should follow  a heart healthy diet.            Medication List    STOP taking these medications        ciprofloxacin 500 MG tablet  Commonly known as:  CIPRO     metroNIDAZOLE 500 MG tablet  Commonly known as:  FLAGYL     naproxen 500 MG tablet  Commonly known as:  NAPROSYN     ondansetron 4 MG disintegrating tablet  Commonly known as:  ZOFRAN ODT     oxyCODONE-acetaminophen 5-325 MG tablet  Commonly known as:  PERCOCET/ROXICET     potassium chloride SA 20 MEQ tablet  Commonly known as:  K-DUR,KLOR-CON      TAKE these medications        amLODipine 5 MG tablet  Commonly known as:  NORVASC  Take 5 mg by mouth daily. Reported on 02/28/2015     aspirin-acetaminophen-caffeine 250-250-65 MG tablet  Commonly known as:  EXCEDRIN MIGRAINE  Take 1 tablet by mouth every 6 (six) hours as needed for headache.     lisinopril 20 MG tablet  Commonly known as:  PRINIVIL,ZESTRIL  Take 20 mg by mouth daily. Reported on 02/28/2015     PRILOSEC 20 MG capsule  Generic drug:  omeprazole  Take 20 mg by mouth daily. Reported on 02/28/2015       No Known Allergies Follow-up Information    Follow up with OSEI-BONSU,GEORGE, MD. Schedule an appointment as soon as possible for a visit in 1 week.   Specialty:  Internal Medicine   Why:  Hospital follow up   Contact information:   [address] [place] [address] [phone]        The results of significant diagnostics from this hospitalization (including imaging, microbiology, ancillary and laboratory) are listed below for reference.    Significant Diagnostic Studies: Ct Angio Chest Pe W/cm &/or Wo Cm  02/28/2015  CLINICAL DATA:  Left-sided chest pain, shortness of breath for 2 weeks EXAM: CT ANGIOGRAPHY CHEST WITH CONTRAST TECHNIQUE: Multidetector CT imaging of the chest was performed using the standard protocol during bolus administration of intravenous contrast. Multiplanar CT image reconstructions and MIPs were obtained to  evaluate the vascular anatomy. CONTRAST:  11m OMNIPAQUE IOHEXOL 350 MG/ML SOLN COMPARISON:  None. FINDINGS: No confluent airspace opacities within the lungs. Areas of scarring in the lungs peripherally in the upper lobes and lung bases. No effusions. No filling defects in the pulmonary arteries to suggest pulmonary emboli. Heart is upper limits normal in size. Aorta is normal caliber. Small scattered mediastinal lymph nodes borderline in size. No axillary or hilar adenopathy. Chest wall soft tissues are unremarkable. Imaging into the upper abdomen shows no acute findings. No acute bone abnormality focal bone lesion. Review of the MIP images confirms the above findings. IMPRESSION: No evidence of pulmonary embolus. Areas of scarring peripherally in the lungs. Small and borderline sized mediastinal lymph nodes, likely reactive. No acute findings in the chest. Electronically Signed   By: KRolm BaptiseM.D.   On: 02/28/2015 16:18   Nm Myocar Multi W/spect W/wall Motion / Ef  03/01/2015  CLINICAL DATA:  55year old with risk factors including hypertension and family history of a sister with congestive heart failure, personal history of MI in 2006,  presenting with 2 week history of chest pain on exertion. EXAM: MYOCARDIAL IMAGING WITH SPECT (REST AND PHARMACOLOGIC-STRESS) GATED LEFT VENTRICULAR WALL MOTION STUDY LEFT VENTRICULAR EJECTION FRACTION TECHNIQUE: Standard myocardial SPECT imaging was performed after resting intravenous injection of 10 mCi Tc-90msestamibi. Subsequently, intravenous infusion of Lexiscan was performed under the supervision of the Cardiology staff. At peak effect of the drug, 30 mCi [id number] was injected intravenously and standard myocardial SPECT imaging was performed. Quantitative gated imaging was also performed to evaluate left ventricular wall motion, and estimate left ventricular ejection fraction. COMPARISON:  07/12/2005. FINDINGS: Perfusion: Diminished activity involving the  mid and distal inferior wall on the immediate post regadenoson images which can be explained by diaphragmatic attenuation. No evidence of reversibility. No interval change since the prior examination. Wall Motion: No focal left ventricular wall motion abnormality. Borderline to mild left ventricular enlargement. Left Ventricular Ejection Fraction: 5011%nd diastolic volume 15941l End systolic volume 79 ml IMPRESSION: 1. No reversible ischemia or infarction. 2. No focal left ventricular wall motion abnormality. Borderline to mild left ventricular enlargement. 3. Left ventricular ejection fraction 50% 4. Low-risk stress test findings*. *2012 Appropriate Use Criteria for Coronary Revascularization Focused Update: J Am Coll Cardiol. 207408;14(4):[email or web address]://[email or web address]?articleid=[email or web address] Electronically Signed   By: ThEvangeline Dakin.D.   On: 03/01/2015 15:31    Microbiology: No results found for this or any previous visit (from the past 240 hour(s)).   Labs: Basic Metabolic Panel:  Recent Labs Lab 02/28/15 1427  NA 141  K 4.1  CL 108  CO2 23  GLUCOSE 95  BUN 11  CREATININE 0.89  CALCIUM 8.9   Liver Function Tests: No results for input(s): AST, ALT, ALKPHOS, BILITOT, PROT, ALBUMIN in the last 168 hours. No results for input(s): LIPASE, AMYLASE in the last 168 hours. No results for input(s): AMMONIA in the last 168 hours. CBC:  Recent Labs Lab 02/28/15 1427  WBC 9.6  NEUTROABS 5.8  HGB 14.0  HCT 43.0  MCV 87.0  PLT 238   Cardiac Enzymes:  Recent Labs Lab 02/28/15 1935 02/28/15 2300 03/01/15 0213 03/01/15 0507  TROPONINI <0.03 <0.03 <0.03 <0.03   BNP: BNP (last 3 results) No results for input(s): BNP in the last 8760 hours.  ProBNP (last 3 results) No results for input(s): PROBNP in the last 8760 hours.  CBG: No results for input(s): GLUCAP in the last 168 hours.     Signed: Cristal [facility] 03/02/2015, 1:58  PM

## 2015-03-02 NOTE — Discharge Instructions (Signed)

## 2015-03-02 NOTE — Progress Notes (Deleted)
1000 called and spoken with PA . Will clarify  Schedule of cardiac cath to pt and family . Aware of lab works

## 2016-08-04 ENCOUNTER — Emergency Department (HOSPITAL_COMMUNITY): Payer: Self-pay

## 2016-08-04 ENCOUNTER — Encounter (HOSPITAL_COMMUNITY): Payer: Self-pay

## 2016-08-04 ENCOUNTER — Emergency Department (HOSPITAL_COMMUNITY)
Admission: EM | Admit: 2016-08-04 | Discharge: 2016-08-04 | Disposition: A | Discharge status: Home / Self Care | Payer: Self-pay | Attending: Emergency Medicine | Admitting: Emergency Medicine

## 2016-08-04 DIAGNOSIS — K5792 Diverticulitis of intestine, part unspecified, without perforation or abscess without bleeding: Secondary | ICD-10-CM

## 2016-08-04 DIAGNOSIS — F17200 Nicotine dependence, unspecified, uncomplicated: Secondary | ICD-10-CM | POA: Insufficient documentation

## 2016-08-04 DIAGNOSIS — I10 Essential (primary) hypertension: Secondary | ICD-10-CM | POA: Insufficient documentation

## 2016-08-04 DIAGNOSIS — K5790 Diverticulosis of intestine, part unspecified, without perforation or abscess without bleeding: Secondary | ICD-10-CM | POA: Insufficient documentation

## 2016-08-04 LAB — CBC WITH DIFFERENTIAL/PLATELET
Basophils Absolute: 0 10*3/uL (ref 0.0–0.1)
Basophils Relative: 0 %
Eosinophils Absolute: 0.2 10*3/uL (ref 0.0–0.7)
Eosinophils Relative: 2 %
HCT: 42.4 % (ref 39.0–52.0)
HEMOGLOBIN: 14.3 g/dL (ref 13.0–17.0)
LYMPHS ABS: 2.3 10*3/uL (ref 0.7–4.0)
LYMPHS PCT: 23 %
MCH: 28.6 pg (ref 26.0–34.0)
MCHC: 33.7 g/dL (ref 30.0–36.0)
MCV: 84.8 fL (ref 78.0–100.0)
Monocytes Absolute: 0.8 10*3/uL (ref 0.1–1.0)
Monocytes Relative: 8 %
NEUTROS ABS: 6.8 10*3/uL (ref 1.7–7.7)
NEUTROS PCT: 67 %
PLATELETS: 218 10*3/uL (ref 150–400)
RBC: 5 MIL/uL (ref 4.22–5.81)
RDW: 13.2 % (ref 11.5–15.5)
WBC: 10.2 10*3/uL (ref 4.0–10.5)

## 2016-08-04 LAB — COMPREHENSIVE METABOLIC PANEL
ALT: 20 U/L (ref 17–63)
ANION GAP: 6 (ref 5–15)
AST: 26 U/L (ref 15–41)
Albumin: 3.7 g/dL (ref 3.5–5.0)
Alkaline Phosphatase: 71 U/L (ref 38–126)
BUN: 14 mg/dL (ref 6–20)
CHLORIDE: 110 mmol/L (ref 101–111)
CO2: 25 mmol/L (ref 22–32)
Calcium: 8.8 mg/dL — ABNORMAL LOW (ref 8.9–10.3)
Creatinine, Ser: 0.89 mg/dL (ref 0.61–1.24)
Glucose, Bld: 116 mg/dL — ABNORMAL HIGH (ref 65–99)
POTASSIUM: 4 mmol/L (ref 3.5–5.1)
SODIUM: 141 mmol/L (ref 135–145)
Total Bilirubin: 0.9 mg/dL (ref 0.3–1.2)
Total Protein: 7.1 g/dL (ref 6.5–8.1)

## 2016-08-04 LAB — LIPASE, BLOOD: LIPASE: 22 U/L (ref 11–51)

## 2016-08-04 MED ORDER — DIPHENHYDRAMINE HCL 50 MG/ML IJ SOLN
25.0000 mg | Freq: Once | INTRAMUSCULAR | Status: AC
  Administered 2016-08-04: 25 mg via INTRAVENOUS
  Filled 2016-08-04: qty 1

## 2016-08-04 MED ORDER — LISINOPRIL 20 MG PO TABS
20.0000 mg | ORAL_TABLET | Freq: Once | ORAL | Status: AC
  Administered 2016-08-04: 20 mg via ORAL
  Filled 2016-08-04: qty 1

## 2016-08-04 MED ORDER — MORPHINE SULFATE (PF) 4 MG/ML IV SOLN
4.0000 mg | Freq: Once | INTRAVENOUS | Status: AC
  Administered 2016-08-04: 4 mg via INTRAVENOUS
  Filled 2016-08-04: qty 1

## 2016-08-04 MED ORDER — LISINOPRIL 20 MG PO TABS: 20.0000 mg | ORAL_TABLET | Freq: Every day | ORAL | 1 refills | Status: DC

## 2016-08-04 MED ORDER — AMOXICILLIN-POT CLAVULANATE 875-125 MG PO TABS
1.0000 | ORAL_TABLET | Freq: Once | ORAL | Status: AC
  Administered 2016-08-04: 1 via ORAL
  Filled 2016-08-04: qty 1

## 2016-08-04 MED ORDER — AMLODIPINE BESYLATE 5 MG PO TABS
5.0000 mg | ORAL_TABLET | Freq: Once | ORAL | Status: AC
  Administered 2016-08-04: 5 mg via ORAL
  Filled 2016-08-04: qty 1

## 2016-08-04 MED ORDER — AMOXICILLIN-POT CLAVULANATE 875-125 MG PO TABS: 1.0000 | ORAL_TABLET | Freq: Two times a day (BID) | ORAL | 0 refills | Status: AC

## 2016-08-04 MED ORDER — SODIUM CHLORIDE 0.9 % IV BOLUS (SEPSIS)
1000.0000 mL | Freq: Once | INTRAVENOUS | Status: AC
  Administered 2016-08-04: 1000 mL via INTRAVENOUS

## 2016-08-04 MED ORDER — IOPAMIDOL (ISOVUE-300) INJECTION 61%
INTRAVENOUS | Status: AC
  Filled 2016-08-04: qty 100

## 2016-08-04 MED ORDER — IOPAMIDOL (ISOVUE-300) INJECTION 61%
100.0000 mL | Freq: Once | INTRAVENOUS | Status: AC | PRN
  Administered 2016-08-04: 100 mL via INTRAVENOUS

## 2016-08-04 MED ORDER — HYDROCHLOROTHIAZIDE 25 MG PO TABS: 25.0000 mg | ORAL_TABLET | Freq: Every day | ORAL | 1 refills | Status: DC

## 2016-08-04 NOTE — ED Notes (Signed)
Patient transported to CT 

## 2016-08-04 NOTE — ED Provider Notes (Signed)
[facility] DEPT Provider Note   CSN: [id number] Arrival date & time: 08/04/16  1506     History   Chief Complaint Chief Complaint  Patient presents with  . Hypertension    HPI Raymond [name] is a 56 y.o. male with PMHx HTN, diverticulitis, MI, presenting with acute onset of left sided headache that began this morning. Pt states he has not taken his BP medications, lisinopril and amlodipine, in 2 days because he was unable to afford refills. States headache is located behind left eye, not improved with advil. He states his headache is improving since his arrival in the ED, without interventions. Also reports lower mid abdominal pain that begain 2-3 days ago. Pt states it is a dull nagging pain, that is not relieved by advil. States he has been constipated since abdominal pain began, though he is still passing gas. Denies vision changes, N/V/D, blood in stool, CP, SOB, urinary sx, or any other complaints today. The history is provided by the patient.    Past Medical History:  Diagnosis Date  . Arthritis   . Baker's cyst    right  . Diverticulitis   . Heart attack ([place]) 2006  . Hypertension     Patient Active Problem List   Diagnosis Date Noted  . Baker cyst 02/28/2015  . Chest pain, exertional 02/28/2015  . Baker's cyst 02/28/2015  . Abnormal EKG 02/28/2015  . MICROSCOPIC HEMATURIA 10/17/2007  . ABDOMINAL PAIN 10/17/2007  . HEMATURIA, HX OF 10/17/2007  . PSYCHOLOGICAL STRESS 05/31/2007  . OBESITY, UNSPECIFIED 04/19/2007  . TOBACCO ABUSE 04/19/2007  . RHINITIS, ALLERGIC NOS 04/19/2006  . HYPERTENSION, BENIGN 03/09/2006  . FATIGUE 03/09/2006  . WEIGHT GAIN 03/09/2006  . HEADACHE 03/09/2006  . ANXIETY 03/02/2006  . CHEST PAIN 03/02/2006    Past Surgical History:  Procedure Laterality Date  . CARDIAC CATHETERIZATION    . CARPAL TUNNEL RELEASE     right  . HAND SURGERY     left  . KNEE SURGERY     left       Home Medications    Prior to Admission  medications   Medication Sig Start Date End Date Taking? Authorizing Provider  amLODipine (NORVASC) 5 MG tablet Take 1 tablet (5 mg total) by mouth daily. Reported on 02/28/2015 03/02/15  Yes Mikhail, [place], DO  lisinopril-hydrochlorothiazide (PRINZIDE,ZESTORETIC) 20-25 MG tablet Take 1 tablet by mouth daily.   Yes [provider]  amoxicillin-clavulanate (AUGMENTIN) 875-125 MG tablet Take 1 tablet by mouth 2 (two) times daily. 08/04/16 08/11/16  Russo, [place] N, PA-C  hydrochlorothiazide (HYDRODIURIL) 25 MG tablet Take 1 tablet (25 mg total) by mouth daily. 08/04/16   Russo, [place] N, PA-C  lisinopril (PRINIVIL,ZESTRIL) 20 MG tablet Take 1 tablet (20 mg total) by mouth daily. 08/04/16   Russo, [place] N, PA-C  omeprazole (PRILOSEC) 20 MG capsule Take 1 capsule (20 mg total) by mouth daily. Reported on 02/28/2015 Patient not taking: Reported on 08/04/2016 03/02/15   Cristal Ford, DO    Family History Family History  Problem Relation Age of Onset  . Lung cancer Mother   . Heart failure Sister     Social History Social History  Substance Use Topics  . Smoking status: Current Every Day Smoker  . Smokeless tobacco: Never Used  . Alcohol use No     Allergies   Patient has no known allergies.   Review of Systems Review of Systems  Constitutional: Positive for appetite change. Negative for chills and fever.  HENT: Negative.   Eyes: Negative for photophobia and visual disturbance.  Respiratory: Negative for shortness of breath.   Cardiovascular: Negative for chest pain.  Gastrointestinal: Positive for abdominal pain and constipation. Negative for blood in stool, diarrhea, nausea and vomiting.  Genitourinary: Negative for decreased urine volume, dysuria and frequency.  Musculoskeletal: Negative.   Skin: Negative.   Neurological: Positive for headaches. Negative for dizziness, weakness and numbness.     Physical Exam Updated Vital Signs BP (!) 159/77   Pulse 69   Temp 98.4 F  (36.9 C) (Oral)   Resp 13   SpO2 96%   Physical Exam  Constitutional: He is oriented to person, place, and time. He appears well-developed and well-nourished. No distress.  Obese  HENT:  Head: Normocephalic and atraumatic.  Mouth/Throat: Oropharynx is clear and moist.  Eyes: Pupils are equal, round, and reactive to light. Conjunctivae and EOM are normal.  Neck: Normal range of motion. Neck supple.  Cardiovascular: Normal rate, regular rhythm, normal heart sounds and intact distal pulses.  Exam reveals no friction rub.   No murmur heard. Pulmonary/Chest: Effort normal and breath sounds normal. No respiratory distress. He has no wheezes. He has no rales.  Abdominal: Soft. Normal appearance and bowel sounds are normal. He exhibits no distension and no mass. There is tenderness in the right upper quadrant, right lower quadrant, periumbilical area, suprapubic area and left lower quadrant. There is no rigidity, no rebound and no guarding.  Neurological: He is alert and oriented to person, place, and time. He displays normal reflexes. No cranial nerve deficit or sensory deficit. He exhibits normal muscle tone. Coordination normal.  No CN deficit, 5/5 strength BUE and BLE, normal finger to nose and heel to shin. Normal gait.   Skin: Skin is warm. He is not diaphoretic.  Psychiatric: He has a normal mood and affect. His behavior is normal.  Nursing note and vitals reviewed.    ED Treatments / Results  Labs (all labs ordered are listed, but only abnormal results are displayed) Labs Reviewed  COMPREHENSIVE METABOLIC PANEL - Abnormal; Notable for the following:       Result Value   Glucose, Bld 116 (*)    Calcium 8.8 (*)    All other components within normal limits  LIPASE, BLOOD  CBC WITH DIFFERENTIAL/PLATELET    EKG  EKG Interpretation None       Radiology Ct Abdomen Pelvis W Contrast  Result Date: 08/04/2016 CLINICAL DATA:  Abdominal pain. Clinical concern for diverticulitis.  EXAM: CT ABDOMEN AND PELVIS WITH CONTRAST TECHNIQUE: Multidetector CT imaging of the abdomen and pelvis was performed using the standard protocol following bolus administration of intravenous contrast. CONTRAST:  181mL ISOVUE-300 IOPAMIDOL (ISOVUE-300) INJECTION 61% COMPARISON:  07/17/2013. FINDINGS: Lower chest: Mild bibasilar linear scarring and/or atelectasis. Hepatobiliary: 6 mm low-density gallstone in the gallbladder. There are also probable additional small noncalcified gallstones in the gallbladder measuring less than 5 mm in diameter each. Mild gallbladder wall thickening with a maximum thickness of 4 mm. No pericholecystic fluid. Unremarkable liver. Pancreas: Unremarkable. No pancreatic ductal dilatation or surrounding inflammatory changes. Spleen: Normal in size without focal abnormality. Adrenals/Urinary Tract: Adrenal glands are unremarkable. Kidneys are normal, without renal calculi, focal lesion, or hydronephrosis. Bladder is unremarkable. Stomach/Bowel: Multiple sigmoid and distal descending colon diverticula. Mild pericolonic soft tissue stranding and adjacent wall thickening involving the proximal to mid sigmoid colon. No fluid collections or free peritoneal air. Vascular/Lymphatic: Atheromatous arterial calcifications without aneurysm. No enlarged lymph nodes. Reproductive:  Minimally enlarged prostate gland containing a small amount of coarse calcification. Other: No abdominal wall hernia or abnormality. No abdominopelvic ascites. Musculoskeletal: Lumbar and lower thoracic spine degenerative changes. IMPRESSION: 1. Sigmoid diverticulosis and mild diverticulitis without abscess. If the patient has not had a recent colonoscopy, a follow-up colonoscopy or sigmoidoscopy would be recommended to exclude an underlying mass. 2. Cholelithiasis. 3. Mild diffuse gallbladder wall thickening. This is most likely due to chronic cholecystitis. Acute cholecystitis is less likely in the absence of symptoms of  acute cholecystitis. Electronically Signed   By: Claudie Revering M.D.   On: 08/04/2016 17:34    Procedures Procedures (including critical care time)  Medications Ordered in ED Medications  iopamidol (ISOVUE-300) 61 % injection (not administered)  sodium chloride 0.9 % bolus 1,000 mL (1,000 mLs Intravenous New Bag/Given 08/04/16 1554)  morphine 4 MG/ML injection 4 mg (4 mg Intravenous Given 08/04/16 1554)  lisinopril (PRINIVIL,ZESTRIL) tablet 20 mg (20 mg Oral Given 08/04/16 1643)  amLODipine (NORVASC) tablet 5 mg (5 mg Oral Given 08/04/16 1642)  diphenhydrAMINE (BENADRYL) injection 25 mg (25 mg Intravenous Given 08/04/16 1643)  iopamidol (ISOVUE-300) 61 % injection 100 mL (100 mLs Intravenous Contrast Given 08/04/16 1706)  amoxicillin-clavulanate (AUGMENTIN) 875-125 MG per tablet 1 tablet (1 tablet Oral Given 08/04/16 1802)     Initial Impression / Assessment and Plan / ED Course  I have reviewed the triage vital signs and the nursing notes.  Pertinent labs & imaging results that were available during my care of the patient were reviewed by me and considered in my medical decision making (see chart for details).     Pt presenting with headache and abdominal pain. BP prior to arrival was 196/126. Patient has not taken his blood pressure medicine 2 days, headache is likely secondary to hypertension. No focal neuro deficits, headache improving on initial evaluation prior to interventions. Patient with history of diverticulitis, with diffuse tenderness on exam. [name] afebrile, not in distress. Will treat pain, obtain labs, CT abd, IVF, home doses of lisinopril and norvasc.  6:18 PM CT abd showing mild diverticulitis, no evidence of abscess or bowel perforation. CT showing cholecystitis, though pt's presentation and exam more consistent with diverticulitis. Notified pt of these findings. Pt tolerating PO. Will give dose of Augmentin in ED for diverticulitis. On re-eval, pt with significant improvement in  headache and blood pressure 159/77. Will discharge with PO augmentin. Discussed importance of taking BP medications. Pt taking Prinzide and having trouble affording medication. Will prescribe lisinopril and HCTZ separately, and provide goodrx coupons for norvasc and augmentin. [name] hemodynamically stable, nontoxic, not in distress, safe for discharge home with PCP follow up.  Patient discussed with and seen by Dr. Leonette Monarch.  Discussed results, findings, treatment and follow up. Patient advised of return precautions. Patient verbalized understanding and agreed with plan.   Final Clinical Impressions(s) / ED Diagnoses   Final diagnoses:  Acute diverticulitis  Essential hypertension    New Prescriptions New Prescriptions   AMOXICILLIN-CLAVULANATE (AUGMENTIN) 875-125 MG TABLET    Take 1 tablet by mouth 2 (two) times daily.   HYDROCHLOROTHIAZIDE (HYDRODIURIL) 25 MG TABLET    Take 1 tablet (25 mg total) by mouth daily.   LISINOPRIL (PRINIVIL,ZESTRIL) 20 MG TABLET    Take 1 tablet (20 mg total) by mouth daily.     Russo, [place] N, PA-C 08/04/16 1818

## 2016-08-04 NOTE — Discharge Instructions (Signed)
Please read instructions below. Begin taking the antibiotic, Augmentin, 2 times per day until gone, for your diverticulitis. Maintain a clear liquid diet for the next few days to allow your bowels to rest.  Lisinopril and HCTZ are on the $4 list at [facility]. You do not need a coupon for this. Follow up with your PCP about your diverticulitis and to follow up on your blood pressure medications. Return to the ER for worsening abdominal pain, fever, or new or concerning symptoms.

## 2016-08-04 NOTE — ED Notes (Signed)
Bed: WI09 Expected date:  Expected time:  Means of arrival:  Comments: EMS-syncope

## 2016-08-04 NOTE — ED Triage Notes (Signed)
Pt arrived [facility] from work. Nurse at work evaluated pt due to headache, BP was 196/126. Has not taken BP medication in 2 days due to diverticulitis pain. 20G IV rt hand.

## 2016-08-04 NOTE — ED Provider Notes (Signed)
Medical screening examination/treatment/procedure(s) were conducted as a shared visit with non-physician practitioner(s) and myself.  I personally evaluated the patient during the encounter. Briefly, the patient is a 56 y.o. male prior diverticulitis who presents to the emergency for elevated blood pressures and associated headache without focal deficits. Also reporting worsening abdominal pain. No chest pain, shortness of breath, or edema. Labs without evidence of end organ damage. CT with mild diverticulitis.   Will start patient on antibiotics for the diverticulitis.  Patient reportedly not been able to afford his blood pressure medicine. We noted that the patient was on a combination of lisinopril HCTZ. We will prescribe him separate prescriptions for each of the medicines.  The patient is safe for discharge with strict return precautions.     Fatima Blank, MD 08/04/16 [phone]

## 2019-04-10 ENCOUNTER — Other Ambulatory Visit: Payer: Self-pay

## 2019-04-10 ENCOUNTER — Emergency Department (HOSPITAL_COMMUNITY): Payer: Self-pay

## 2019-04-10 ENCOUNTER — Emergency Department (HOSPITAL_COMMUNITY)
Admission: EM | Admit: 2019-04-10 | Discharge: 2019-04-10 | Disposition: A | Discharge status: Home / Self Care | Payer: Self-pay | Attending: Emergency Medicine | Admitting: Emergency Medicine

## 2019-04-10 ENCOUNTER — Encounter (HOSPITAL_COMMUNITY): Payer: Self-pay

## 2019-04-10 DIAGNOSIS — F17200 Nicotine dependence, unspecified, uncomplicated: Secondary | ICD-10-CM | POA: Insufficient documentation

## 2019-04-10 DIAGNOSIS — K5792 Diverticulitis of intestine, part unspecified, without perforation or abscess without bleeding: Secondary | ICD-10-CM | POA: Insufficient documentation

## 2019-04-10 DIAGNOSIS — I10 Essential (primary) hypertension: Secondary | ICD-10-CM | POA: Insufficient documentation

## 2019-04-10 LAB — COMPREHENSIVE METABOLIC PANEL
ALT: 20 U/L (ref 0–44)
AST: 16 U/L (ref 15–41)
Albumin: 4 g/dL (ref 3.5–5.0)
Alkaline Phosphatase: 73 U/L (ref 38–126)
Anion gap: 9 (ref 5–15)
BUN: 22 mg/dL — ABNORMAL HIGH (ref 6–20)
CO2: 25 mmol/L (ref 22–32)
Calcium: 9.3 mg/dL (ref 8.9–10.3)
Chloride: 107 mmol/L (ref 98–111)
Creatinine, Ser: 0.89 mg/dL (ref 0.61–1.24)
GFR calc Af Amer: >60 mL/min (ref >60)
GFR calc non Af Amer: >60 mL/min (ref >60)
Glucose, Bld: 105 mg/dL — ABNORMAL HIGH (ref 70–99)
Potassium: 3.8 mmol/L (ref 3.5–5.1)
Sodium: 141 mmol/L (ref 135–145)
Total Bilirubin: 0.8 mg/dL (ref 0.3–1.2)
Total Protein: 7.5 g/dL (ref 6.5–8.1)

## 2019-04-10 LAB — URINALYSIS, ROUTINE W REFLEX MICROSCOPIC
Bacteria, UA: NONE SEEN
Bilirubin Urine: NEGATIVE
Glucose, UA: NEGATIVE mg/dL
Ketones, ur: NEGATIVE mg/dL
Leukocytes,Ua: NEGATIVE
Nitrite: NEGATIVE
Protein, ur: NEGATIVE mg/dL
Specific Gravity, Urine: 1.018 (ref 1.005–1.030)
pH: 5 (ref 5.0–8.0)

## 2019-04-10 LAB — CBC
HCT: 44.4 % (ref 39.0–52.0)
Hemoglobin: 13.9 g/dL (ref 13.0–17.0)
MCH: 27.9 pg (ref 26.0–34.0)
MCHC: 31.3 g/dL (ref 30.0–36.0)
MCV: 89.2 fL (ref 80.0–100.0)
Platelets: 209 10*3/uL (ref 150–400)
RBC: 4.98 MIL/uL (ref 4.22–5.81)
RDW: 12.7 % (ref 11.5–15.5)
WBC: 11.8 10*3/uL — ABNORMAL HIGH (ref 4.0–10.5)
nRBC: 0 % (ref 0.0–0.2)

## 2019-04-10 LAB — LIPASE, BLOOD: Lipase: 19 U/L (ref 11–51)

## 2019-04-10 MED ORDER — NAPROXEN 375 MG PO TABS: 375.0000 mg | ORAL_TABLET | Freq: Two times a day (BID) | ORAL | 0 refills | Status: DC

## 2019-04-10 MED ORDER — AMOXICILLIN-POT CLAVULANATE 875-125 MG PO TABS: 1.0000 | ORAL_TABLET | Freq: Two times a day (BID) | ORAL | 0 refills | Status: AC

## 2019-04-10 MED ORDER — HYDROMORPHONE HCL 1 MG/ML IJ SOLN
1.0000 mg | Freq: Once | INTRAMUSCULAR | Status: AC
  Administered 2019-04-10: 1 mg via INTRAVENOUS
  Filled 2019-04-10: qty 1

## 2019-04-10 MED ORDER — HYDROCODONE-ACETAMINOPHEN 5-325 MG PO TABS: 1.0000 | ORAL_TABLET | ORAL | 0 refills | Status: DC | PRN

## 2019-04-10 MED ORDER — IOHEXOL 300 MG/ML  SOLN
100.0000 mL | Freq: Once | INTRAMUSCULAR | Status: AC | PRN
  Administered 2019-04-10: 100 mL via INTRAVENOUS

## 2019-04-10 MED ORDER — SODIUM CHLORIDE 0.9% FLUSH: 3.0000 mL | Freq: Once | INTRAVENOUS | Status: DC

## 2019-04-10 MED ORDER — SODIUM CHLORIDE (PF) 0.9 % IJ SOLN
INTRAMUSCULAR | Status: AC
  Filled 2019-04-10: qty 50

## 2019-04-10 MED ORDER — SODIUM CHLORIDE 0.9 % IV BOLUS
500.0000 mL | Freq: Once | INTRAVENOUS | Status: AC
  Administered 2019-04-10: 500 mL via INTRAVENOUS

## 2019-04-10 NOTE — ED Provider Notes (Signed)
[facility] Emergency Department Provider Note MRN:  [id number]  Arrival date & time: 04/10/19     Chief Complaint   Abdominal pain History of Present Illness   Raymond [name] is a 59 y.o. year-old male with a history of hypertension presenting to the ED with chief complaint of abdominal pain.  Location: Lower abdomen Duration: 2 weeks Onset: Gradual Timing: Constant Description: Dull ache Severity: Moderate to severe Exacerbating/Alleviating Factors: None Associated Symptoms: Decreased bowel movements Pertinent Negatives: Denies fever, no vomiting, no diarrhea, no chest pain, shortness of breath, no headache, no vision change.   Review of Systems  A complete 10 system review of systems was obtained and all systems are negative except as noted in the HPI and PMH.   Patient's Health History    Past Medical History:  Diagnosis Date  . Arthritis   . Baker's cyst    right  . Diverticulitis   . Heart attack ([place]) 2006  . Hypertension     Past Surgical History:  Procedure Laterality Date  . CARDIAC CATHETERIZATION    . CARPAL TUNNEL RELEASE     right  . HAND SURGERY     left  . KNEE SURGERY     left    Family History  Problem Relation Age of Onset  . Lung cancer Mother   . Heart failure Sister     Social History   Socioeconomic History  . Marital status: Single    Spouse name: Not on file  . Number of children: Not on file  . Years of education: Not on file  . Highest education level: Not on file  Occupational History  . Not on file  Tobacco Use  . Smoking status: Current Every Day Smoker  . Smokeless tobacco: Never Used  Substance and Sexual Activity  . Alcohol use: No  . Drug use: No  . Sexual activity: Not on file  Other Topics Concern  . Not on file  Social History Narrative  . Not on file   Social Determinants of Health   Financial Resource Strain:   . Difficulty of Paying Living Expenses:   Food Insecurity:   .  Worried About Charity fundraiser in the Last Year:   . Arboriculturist in the Last Year:   Transportation Needs:   . Film/video editor (Medical):   Marland Kitchen Lack of Transportation (Non-Medical):   Physical Activity:   . Days of Exercise per Week:   . Minutes of Exercise per Session:   Stress:   . Feeling of Stress :   Social Connections:   . Frequency of Communication with Friends and Family:   . Frequency of Social Gatherings with Friends and Family:   . Attends Religious Services:   . Active Member of Clubs or Organizations:   . Attends Archivist Meetings:   Marland Kitchen Marital Status:   Intimate Partner Violence:   . Fear of Current or Ex-Partner:   . Emotionally Abused:   Marland Kitchen Physically Abused:   . Sexually Abused:      Physical Exam   Vitals:   04/10/19 1530 04/10/19 1700  BP: (!) 175/101 (!) 207/101  Pulse: 76 78  Resp: 18 18  Temp:    SpO2: 97% 97%    CONSTITUTIONAL: Well-appearing, NAD NEURO:  Alert and oriented x 3, no focal deficits EYES:  eyes equal and reactive ENT/NECK:  no LAD, no JVD CARDIO: Regular rate, well-perfused, normal S1 and S2 PULM:  CTAB no wheezing or rhonchi GI/GU:  normal bowel sounds, non-distended, mild left lower quadrant tenderness to palpation MSK/SPINE:  No gross deformities, no edema SKIN:  no rash, atraumatic PSYCH:  Appropriate speech and behavior  *Additional and/or pertinent findings included in MDM below  Diagnostic and Interventional Summary    EKG Interpretation  Date/Time:    Ventricular Rate:    PR Interval:    QRS Duration:   QT Interval:    QTC Calculation:   R Axis:     Text Interpretation:        Labs Reviewed  COMPREHENSIVE METABOLIC PANEL - Abnormal; Notable for the following components:      Result Value   Glucose, Bld 105 (*)    BUN 22 (*)    All other components within normal limits  CBC - Abnormal; Notable for the following components:   WBC 11.8 (*)    All other components within normal limits    URINALYSIS, ROUTINE W REFLEX MICROSCOPIC - Abnormal; Notable for the following components:   Hgb urine dipstick SMALL (*)    All other components within normal limits  LIPASE, BLOOD    CT ABDOMEN PELVIS W CONTRAST  Final Result      Medications  sodium chloride (PF) 0.9 % injection (has no administration in time range)  HYDROmorphone (DILAUDID) injection 1 mg (1 mg Intravenous Given 04/10/19 1505)  sodium chloride 0.9 % bolus 500 mL (0 mLs Intravenous Stopped 04/10/19 1558)  iohexol (OMNIPAQUE) 300 MG/ML solution 100 mL (100 mLs Intravenous Contrast Given 04/10/19 1605)     Procedures  /  Critical Care Procedures  ED Course and Medical Decision Making  I have reviewed the triage vital signs, the nursing notes, and pertinent available records from the EMR.  Listed above are laboratory and imaging tests that I personally ordered, reviewed, and interpreted and then considered in my medical decision making (see below for details).      History of diverticulitis, 2 weeks of pain, afebrile, reassuring vital signs, CT to exclude complication such as abscess.  CT confirms uncomplicated diverticulitis.  Labs reassuring.  Pain is well controlled.  Appropriate for discharge with antibiotics and pain control.  Barth Kirks. Sedonia Small, [place] [email or web address]  Final Clinical Impressions(s) / ED Diagnoses     ICD-10-CM   1. Diverticulitis  K57.92     ED Discharge Orders         Ordered    HYDROcodone-acetaminophen (NORCO/VICODIN) 5-325 MG tablet  Every 4 hours PRN     04/10/19 1703    amoxicillin-clavulanate (AUGMENTIN) 875-125 MG tablet  Every 12 hours     04/10/19 1703    naproxen (NAPROSYN) 375 MG tablet  2 times daily with meals     04/10/19 1703           Discharge Instructions Discussed with and Provided to Patient:     Discharge Instructions     You were evaluated in the Emergency Department and after careful  evaluation, we did not find any emergent condition requiring admission or further testing in the hospital.  Your exam/testing today is overall reassuring.  Your symptoms seem to be due to diverticulitis.  Please take the antibiotics and pain medication as directed.  It is important to avoid constipation to prevent future episodes.  We recommend a high-fiber diet and stool softeners as needed.  Please return to the Emergency Department if you experience any worsening of your condition.  We  encourage you to follow up with a primary care provider.  Thank you for allowing [place] to be a part of your care.       Maudie Flakes, MD 04/10/19 [phone]

## 2019-04-10 NOTE — ED Triage Notes (Addendum)
Patient reports his diverticulitis is "acting up" Patient reports he as at work and got his BP check and was "200 over something". Patient states he has not taken BP medication in 24months due to can not afford it.   Patient ambulatory in triage A/Ox4  C/O lower mid abdominal pain and left upper abdominal pain X 2 weeks   Patient reports he noticed his has been cloudy "a while"  10/10 cramping   Denies chest pain or shob

## 2019-04-10 NOTE — Discharge Instructions (Addendum)
You were evaluated in the Emergency Department and after careful evaluation, we did not find any emergent condition requiring admission or further testing in the hospital.  Your exam/testing today is overall reassuring.  Your symptoms seem to be due to diverticulitis.  Please take the antibiotics and pain medication as directed.  It is important to avoid constipation to prevent future episodes.  We recommend a high-fiber diet and stool softeners as needed.  Please return to the Emergency Department if you experience any worsening of your condition.  We encourage you to follow up with a primary care provider.  Thank you for allowing [place] to be a part of your care.

## 2019-04-10 NOTE — ED Notes (Signed)
Pt transported to CT ?

## 2019-05-20 ENCOUNTER — Emergency Department (HOSPITAL_COMMUNITY): Payer: Medicare Other

## 2019-05-20 ENCOUNTER — Inpatient Hospital Stay (HOSPITAL_COMMUNITY): Payer: Medicare Other

## 2019-05-20 ENCOUNTER — Inpatient Hospital Stay (HOSPITAL_COMMUNITY)
Admission: EM | Admit: 2019-05-20 | Discharge: 2019-05-25 | DRG: 552 | Disposition: A | Discharge status: Home Health | Payer: Medicare Other | Attending: Family Medicine | Admitting: Family Medicine

## 2019-05-20 ENCOUNTER — Encounter (HOSPITAL_COMMUNITY): Payer: Self-pay

## 2019-05-20 ENCOUNTER — Other Ambulatory Visit: Payer: Self-pay

## 2019-05-20 DIAGNOSIS — M25562 Pain in left knee: Secondary | ICD-10-CM | POA: Diagnosis present

## 2019-05-20 DIAGNOSIS — G8222 Paraplegia, incomplete: Secondary | ICD-10-CM | POA: Diagnosis present

## 2019-05-20 DIAGNOSIS — I252 Old myocardial infarction: Secondary | ICD-10-CM

## 2019-05-20 DIAGNOSIS — F419 Anxiety disorder, unspecified: Secondary | ICD-10-CM | POA: Diagnosis present

## 2019-05-20 DIAGNOSIS — R739 Hyperglycemia, unspecified: Secondary | ICD-10-CM | POA: Diagnosis present

## 2019-05-20 DIAGNOSIS — J309 Allergic rhinitis, unspecified: Secondary | ICD-10-CM | POA: Diagnosis present

## 2019-05-20 DIAGNOSIS — Z791 Long term (current) use of non-steroidal anti-inflammatories (NSAID): Secondary | ICD-10-CM

## 2019-05-20 DIAGNOSIS — I2510 Atherosclerotic heart disease of native coronary artery without angina pectoris: Secondary | ICD-10-CM | POA: Diagnosis present

## 2019-05-20 DIAGNOSIS — Z8719 Personal history of other diseases of the digestive system: Secondary | ICD-10-CM

## 2019-05-20 DIAGNOSIS — I77819 Aortic ectasia, unspecified site: Secondary | ICD-10-CM | POA: Diagnosis present

## 2019-05-20 DIAGNOSIS — F17200 Nicotine dependence, unspecified, uncomplicated: Secondary | ICD-10-CM

## 2019-05-20 DIAGNOSIS — M48062 Spinal stenosis, lumbar region with neurogenic claudication: Secondary | ICD-10-CM | POA: Diagnosis present

## 2019-05-20 DIAGNOSIS — R9431 Abnormal electrocardiogram [ECG] [EKG]: Secondary | ICD-10-CM | POA: Diagnosis present

## 2019-05-20 DIAGNOSIS — Z7982 Long term (current) use of aspirin: Secondary | ICD-10-CM

## 2019-05-20 DIAGNOSIS — R29898 Other symptoms and signs involving the musculoskeletal system: Secondary | ICD-10-CM | POA: Diagnosis present

## 2019-05-20 DIAGNOSIS — Z79899 Other long term (current) drug therapy: Secondary | ICD-10-CM

## 2019-05-20 DIAGNOSIS — M25561 Pain in right knee: Secondary | ICD-10-CM | POA: Diagnosis present

## 2019-05-20 DIAGNOSIS — Z9114 Patient's other noncompliance with medication regimen: Secondary | ICD-10-CM | POA: Diagnosis not present

## 2019-05-20 DIAGNOSIS — R270 Ataxia, unspecified: Secondary | ICD-10-CM | POA: Diagnosis present

## 2019-05-20 DIAGNOSIS — Z716 Tobacco abuse counseling: Secondary | ICD-10-CM

## 2019-05-20 DIAGNOSIS — Z6841 Body Mass Index (BMI) 40.0 and over, adult: Secondary | ICD-10-CM | POA: Diagnosis not present

## 2019-05-20 DIAGNOSIS — Z20822 Contact with and (suspected) exposure to covid-19: Secondary | ICD-10-CM | POA: Diagnosis present

## 2019-05-20 DIAGNOSIS — H9202 Otalgia, left ear: Secondary | ICD-10-CM | POA: Diagnosis present

## 2019-05-20 DIAGNOSIS — K5732 Diverticulitis of large intestine without perforation or abscess without bleeding: Secondary | ICD-10-CM | POA: Diagnosis present

## 2019-05-20 DIAGNOSIS — F17210 Nicotine dependence, cigarettes, uncomplicated: Secondary | ICD-10-CM | POA: Diagnosis present

## 2019-05-20 DIAGNOSIS — E6609 Other obesity due to excess calories: Secondary | ICD-10-CM

## 2019-05-20 DIAGNOSIS — I10 Essential (primary) hypertension: Secondary | ICD-10-CM | POA: Diagnosis present

## 2019-05-20 DIAGNOSIS — E669 Obesity, unspecified: Secondary | ICD-10-CM | POA: Diagnosis present

## 2019-05-20 LAB — CBC
HCT: 47 % (ref 39.0–52.0)
Hemoglobin: 15 g/dL (ref 13.0–17.0)
MCH: 28.4 pg (ref 26.0–34.0)
MCHC: 31.9 g/dL (ref 30.0–36.0)
MCV: 89 fL (ref 80.0–100.0)
Platelets: 236 10*3/uL (ref 150–400)
RBC: 5.28 MIL/uL (ref 4.22–5.81)
RDW: 12.7 % (ref 11.5–15.5)
WBC: 10.6 10*3/uL — ABNORMAL HIGH (ref 4.0–10.5)
nRBC: 0 % (ref 0.0–0.2)

## 2019-05-20 LAB — URINALYSIS, ROUTINE W REFLEX MICROSCOPIC
Bilirubin Urine: NEGATIVE
Glucose, UA: NEGATIVE mg/dL
Hgb urine dipstick: NEGATIVE
Ketones, ur: NEGATIVE mg/dL
Leukocytes,Ua: NEGATIVE
Nitrite: NEGATIVE
Protein, ur: NEGATIVE mg/dL
Specific Gravity, Urine: 1.016 (ref 1.005–1.030)
pH: 6 (ref 5.0–8.0)

## 2019-05-20 LAB — COMPREHENSIVE METABOLIC PANEL
ALT: 23 U/L (ref 0–44)
AST: 27 U/L (ref 15–41)
Albumin: 3.9 g/dL (ref 3.5–5.0)
Alkaline Phosphatase: 87 U/L (ref 38–126)
Anion gap: 10 (ref 5–15)
BUN: 10 mg/dL (ref 6–20)
CO2: 27 mmol/L (ref 22–32)
Calcium: 9.6 mg/dL (ref 8.9–10.3)
Chloride: 102 mmol/L (ref 98–111)
Creatinine, Ser: 1.19 mg/dL (ref 0.61–1.24)
GFR calc Af Amer: >60 mL/min (ref >60)
GFR calc non Af Amer: >60 mL/min (ref >60)
Glucose, Bld: 115 mg/dL — ABNORMAL HIGH (ref 70–99)
Potassium: 3.7 mmol/L (ref 3.5–5.1)
Sodium: 139 mmol/L (ref 135–145)
Total Bilirubin: 0.5 mg/dL (ref 0.3–1.2)
Total Protein: 7.2 g/dL (ref 6.5–8.1)

## 2019-05-20 LAB — I-STAT CHEM 8, ED
BUN: 13 mg/dL (ref 6–20)
Calcium, Ion: 1.17 mmol/L (ref 1.15–1.40)
Chloride: 102 mmol/L (ref 98–111)
Creatinine, Ser: 1 mg/dL (ref 0.61–1.24)
Glucose, Bld: 98 mg/dL (ref 70–99)
HCT: 45 % (ref 39.0–52.0)
Hemoglobin: 15.3 g/dL (ref 13.0–17.0)
Potassium: 3.8 mmol/L (ref 3.5–5.1)
Sodium: 137 mmol/L (ref 135–145)
TCO2: 30 mmol/L (ref 22–32)

## 2019-05-20 LAB — DIFFERENTIAL
Abs Immature Granulocytes: 0.06 10*3/uL (ref 0.00–0.07)
Basophils Absolute: 0 10*3/uL (ref 0.0–0.1)
Basophils Relative: 0 %
Eosinophils Absolute: 0.1 10*3/uL (ref 0.0–0.5)
Eosinophils Relative: 1 %
Immature Granulocytes: 1 %
Lymphocytes Relative: 15 %
Lymphs Abs: 1.6 10*3/uL (ref 0.7–4.0)
Monocytes Absolute: 1.2 10*3/uL — ABNORMAL HIGH (ref 0.1–1.0)
Monocytes Relative: 11 %
Neutro Abs: 7.6 10*3/uL (ref 1.7–7.7)
Neutrophils Relative %: 72 %

## 2019-05-20 LAB — RAPID URINE DRUG SCREEN, HOSP PERFORMED
Amphetamines: NOT DETECTED
Barbiturates: NOT DETECTED
Benzodiazepines: NOT DETECTED
Cocaine: NOT DETECTED
Opiates: NOT DETECTED
Tetrahydrocannabinol: NOT DETECTED

## 2019-05-20 LAB — CSF CELL COUNT WITH DIFFERENTIAL
RBC Count, CSF: 0 /mm3
RBC Count, CSF: 1 /mm3 — ABNORMAL HIGH
Tube #: 1
Tube #: 4
WBC, CSF: 2 /mm3 (ref 0–5)
WBC, CSF: 2 /mm3 (ref 0–5)

## 2019-05-20 LAB — PROTEIN, CSF: Total  Protein, CSF: 43 mg/dL (ref 15–45)

## 2019-05-20 LAB — GRAM STAIN

## 2019-05-20 LAB — APTT: aPTT: 28 seconds (ref 24–36)

## 2019-05-20 LAB — PROTIME-INR
INR: 1 (ref 0.8–1.2)
Prothrombin Time: 12.6 seconds (ref 11.4–15.2)

## 2019-05-20 LAB — GLUCOSE, CSF: Glucose, CSF: 63 mg/dL (ref 40–70)

## 2019-05-20 LAB — SARS CORONAVIRUS 2 BY RT PCR (HOSPITAL ORDER, PERFORMED IN ~~LOC~~ HOSPITAL LAB): SARS Coronavirus 2: NEGATIVE

## 2019-05-20 LAB — ETHANOL: Alcohol, Ethyl (B): <10 mg/dL (ref <10)

## 2019-05-20 MED ORDER — HYDROCODONE-ACETAMINOPHEN 5-325 MG PO TABS
1.0000 | ORAL_TABLET | ORAL | Status: DC | PRN
  Administered 2019-05-21 – 2019-05-24 (×11): 1 via ORAL
  Filled 2019-05-20 (×11): qty 1

## 2019-05-20 MED ORDER — ONDANSETRON HCL 4 MG PO TABS
4.0000 mg | ORAL_TABLET | Freq: Four times a day (QID) | ORAL | Status: DC | PRN
  Filled 2019-05-20: qty 1

## 2019-05-20 MED ORDER — ONDANSETRON HCL 4 MG/2ML IJ SOLN
4.0000 mg | Freq: Four times a day (QID) | INTRAMUSCULAR | Status: DC | PRN
  Administered 2019-05-22 – 2019-05-24 (×3): 4 mg via INTRAVENOUS
  Filled 2019-05-20 (×3): qty 2

## 2019-05-20 MED ORDER — SODIUM CHLORIDE 0.9 % IV SOLN: INTRAVENOUS | Status: AC

## 2019-05-20 MED ORDER — ACETAMINOPHEN 325 MG PO TABS
650.0000 mg | ORAL_TABLET | Freq: Once | ORAL | Status: AC
  Administered 2019-05-20: 650 mg via ORAL
  Filled 2019-05-20: qty 2

## 2019-05-20 MED ORDER — ACETAMINOPHEN 650 MG RE SUPP: 650.0000 mg | Freq: Four times a day (QID) | RECTAL | Status: DC | PRN

## 2019-05-20 MED ORDER — ACETAMINOPHEN 325 MG PO TABS: 650.0000 mg | ORAL_TABLET | Freq: Four times a day (QID) | ORAL | Status: DC | PRN

## 2019-05-20 MED ORDER — SODIUM CHLORIDE 0.9% FLUSH
3.0000 mL | Freq: Two times a day (BID) | INTRAVENOUS | Status: DC
  Administered 2019-05-21 – 2019-05-25 (×9): 3 mL via INTRAVENOUS

## 2019-05-20 MED ORDER — NICOTINE 21 MG/24HR TD PT24
21.0000 mg | MEDICATED_PATCH | Freq: Every day | TRANSDERMAL | Status: DC
  Administered 2019-05-21 – 2019-05-24 (×4): 21 mg via TRANSDERMAL
  Filled 2019-05-20 (×5): qty 1

## 2019-05-20 MED ORDER — GADOBUTROL 1 MMOL/ML IV SOLN
10.0000 mL | Freq: Once | INTRAVENOUS | Status: AC | PRN
  Administered 2019-05-20: 10 mL via INTRAVENOUS

## 2019-05-20 MED ORDER — SODIUM CHLORIDE 0.9% FLUSH
3.0000 mL | Freq: Once | INTRAVENOUS | Status: AC
  Administered 2019-05-21: 3 mL via INTRAVENOUS

## 2019-05-20 MED ORDER — DOCUSATE SODIUM 100 MG PO CAPS
100.0000 mg | ORAL_CAPSULE | Freq: Two times a day (BID) | ORAL | Status: DC
  Administered 2019-05-21: 100 mg via ORAL
  Filled 2019-05-20: qty 1

## 2019-05-20 NOTE — ED Triage Notes (Signed)
Pt from work with ems for c.o loss of balance and  coordination with difficulty ambulating since Friday. Pt denies unilateral weakness or dizziness. Pt also c.o left earache for the past week. Pt a.o, nad noted.

## 2019-05-20 NOTE — Consult Note (Addendum)
NEURO HOSPITALIST CONSULT NOTE   Requesting physician: Dr. Roel Cluck  Reason for Consult: BLE weakness x 1 year, with acute worsening on Friday in conjunction with a spell of transient alteration of awareness.   History obtained from:  Patient, Wife and Chart    HPI:                                                                                                                                          Raymond [name] is an 59 y.o. male who presents to the ED today after an episode at work of transient alteration in awareness in conjunction with acute onset of bilateral lower extremity weakness that has persisted.  He arrived from work via EMS with a chief complaint of loss of balance and  coordination with difficulty ambulating since Friday. He denies any unilateral weakness.    History was taken at the bedside and is essentially identical to that documented by EDP: "3 days ago, Friday 14 May, the patient was at work.  He states that he remembers being aware around 2 PM and then the next thing he remembers two of his coworkers were trying to lift him under his arms and walk him to be seated.  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  He has no recollection of this event.  He noticed that he is having some difficulty walking and states that his legs felt "really heavy."  The patient was driven home but returned to work the next day for a 12-hour shift.  He states that he was able to get through it but felt exhausted and "just really funny."  He was able to ambulate but his coworkers kept commenting that he did not look right and that he was walking funny.  The patient took Sunday off and slept most of the day.  Patient returned to work today and again his coworkers noticed that he is having a lot of difficulty walking.  The nurse on staff came to see him and called EMS and sent him to the hospital.  He has a past medical history of hypertension.   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  He does not drink alcohol or use drugs.  He had his second maternal Covid shot about 4 weeks ago.  He has had no other significant illnesses recently.  He denies any new medications.  He denies fevers, chills, urinary symptoms, nausea, vomiting."  The patient also endorses an 1-year history of gradually worsening BLE weakness with the episode Friday constituting an acute and significant worsening. He denies any numbness or tingling, except intermittently in his hands at night, which is a chronic condition. Does not endorse any  B/B incontinence or saddle anesthesia. No back pain. Has bilateral knee pain chronically.   MRI brain obtained in the ED is normal.   LP has been performed by EDP, with CSF labs pending.   Past Medical History:  Diagnosis Date  . Arthritis   . Baker's cyst    right  . Diverticulitis   . Heart attack ([place]) 2006  . Hypertension     Past Surgical History:  Procedure Laterality Date  . CARDIAC CATHETERIZATION    . CARPAL TUNNEL RELEASE     right  . HAND SURGERY     left  . KNEE SURGERY     left    Family History  Problem Relation Age of Onset  . Lung cancer Mother   . Heart failure Sister               Social History:  reports that he has been smoking. He has never used smokeless tobacco. He reports that he does not drink alcohol or use drugs.  No Known Allergies  HOME MEDICATIONS:                                                                                                                      No current facility-administered medications on file prior to encounter.   Current Outpatient Medications on File Prior to Encounter  Medication Sig Dispense Refill  . amLODipine (NORVASC) 5 MG tablet Take 1 tablet (5 mg total) by mouth daily. Reported on 02/28/2015 30 tablet 0  . hydrochlorothiazide (HYDRODIURIL) 25 MG tablet Take 1 tablet (25 mg total) by mouth daily. 30 tablet 1   . HYDROcodone-acetaminophen (NORCO/VICODIN) 5-325 MG tablet Take 1 tablet by mouth every 4 (four) hours as needed. 5 tablet 0  . lisinopril (PRINIVIL,ZESTRIL) 20 MG tablet Take 1 tablet (20 mg total) by mouth daily. 30 tablet 1  . lisinopril-hydrochlorothiazide (PRINZIDE,ZESTORETIC) 20-25 MG tablet Take 1 tablet by mouth daily.    . naproxen (NAPROSYN) 375 MG tablet Take 1 tablet (375 mg total) by mouth 2 (two) times daily with a meal. 20 tablet 0  . omeprazole (PRILOSEC) 20 MG capsule Take 1 capsule (20 mg total) by mouth daily. Reported on 02/28/2015 (Patient not taking: Reported on 08/04/2016) 30 capsule 0     ROS:  Denies any dizziness. No sensory loss, except for longstanding condition of intermittent numbness of his hands at night. Has had a left earache for the past week. Has chronic bilateral knee pain. Does not endorse any additional symptoms.    Blood pressure (!) 158/88, pulse 81, temperature 99.4 F (37.4 C), temperature source Oral, resp. rate 18, height 5\' 10"  (1.778 m), weight 131.5 kg, SpO2 96 %.   General Examination:                                                                                                       Physical Exam  HEENT-  [place]/AT   Lungs- Respirations unlabored Extremities- Warm and well perfused  Neurological Examination Mental Status: Awake, alert and fully oriented. Thought content appropriate.  Speech fluent with intact comprehension and naming. Able to follow all commands without difficulty. Cranial Nerves: II: Visual fields intact with no extinction to DSS. PERRL.  III,IV, VI: No ptosis. EOMI with saccadic pursuits noted. Latent exotropia noted with left eye drifting slightly laterally when not focusing on an object.  V,VII: Smile symmetric. Facial temp sensation equal bilaterally VIII: Hearing intact to conversation.   IX,X: Palate rises symmetrically XI: Symmetric shoulder shrug XII: Midline tongue extension Motor: RUE and RLE 5/5 proximally and distally.  RLE 4-/5 hip flexion, 4+/5 knee extension, 5/5 ADF/APF LLE: 4-/5 hip flexion, 4+/5 knee extension, 5/5 ADF/APF BLE exam somewhat effort dependent due to chronic knee pain - improves with coaching to the above ratings.  Sensory: Temp and FT intact in all 4 extremities including the feet bilaterally. No extinction to DSS. Somewhat more sensitive to cool temp stimuli in lower extremities relative to upper extremities.  Deep Tendon Reflexes: 1+ bilateral brachioradialis and biceps. 1+ left patellar, trace right patellar 0 right achilles, trace left achilles Plantars: Right: downgoing   Left: downgoing Cerebellar: No ataxia with FNF bilaterally  Gait: Requires placement of upper extremities on BLE while pushing downwards to stand up.    Lab Results: Basic Metabolic Panel: Recent Labs  Lab 05/20/19 1324 05/20/19 1941  NA 139 137  K 3.7 3.8  CL 102 102  CO2 27  --   GLUCOSE 115* 98  BUN 10 13  CREATININE 1.19 1.00  CALCIUM 9.6  --     CBC: Recent Labs  Lab 05/20/19 1324 05/20/19 1941  WBC 10.6*  --   NEUTROABS 7.6  --   HGB 15.0 15.3  HCT 47.0 45.0  MCV 89.0  --   PLT 236  --     Cardiac Enzymes: No results for input(s): CKTOTAL, CKMB, CKMBINDEX, TROPONINI in the last 168 hours.  Lipid Panel: No results for input(s): CHOL, TRIG, HDL, CHOLHDL, VLDL, LDLCALC in the last 168 hours.  Imaging: CT HEAD WO CONTRAST  Result Date: 05/20/2019 CLINICAL DATA:  Ataxia, stroke suspected, loss of balance EXAM: CT HEAD WITHOUT CONTRAST TECHNIQUE: Contiguous axial images were obtained from the base of the skull through the vertex without intravenous contrast. COMPARISON:  None. FINDINGS: Brain: No evidence of acute infarction, hemorrhage, hydrocephalus, extra-axial collection or mass  lesion/mass effect. Vascular: Atherosclerotic calcification of  the carotid siphons. No hyperdense vessel. Skull: No calvarial fracture or suspicious osseous lesion. No scalp swelling or hematoma. Sinuses/Orbits: Paranasal sinuses and mastoid air cells are predominantly clear. Included orbital structures are unremarkable. Other: None IMPRESSION: No acute intracranial findings. If there is persisting clinical concern for infarct, MRI is more sensitive and specific for early changes of ischemia. Electronically Signed   By: Lovena Le M.D.   On: 05/20/2019 17:59   MR BRAIN WO CONTRAST  Result Date: 05/20/2019 CLINICAL DATA:  59 year old male with ataxia, loss of balance. EXAM: MRI HEAD WITHOUT CONTRAST TECHNIQUE: Multiplanar, multiecho pulse sequences of the brain and surrounding structures were obtained without intravenous contrast. COMPARISON:  Head CT 1748 hours today. FINDINGS: Brain: No restricted diffusion to suggest acute infarction. No midline shift, mass effect, evidence of mass lesion, ventriculomegaly, extra-axial collection or acute intracranial hemorrhage. Cervicomedullary junction and pituitary are within normal limits. Pearline Cables and white matter signal is within normal limits for age throughout the brain. No cortical encephalomalacia or chronic cerebral blood products identified. Vascular: Major intracranial vascular flow voids are preserved. Skull and upper cervical spine: Negative visible cervical spine, bone marrow signal. Sinuses/Orbits: Mildly Disconjugate gaze but otherwise negative orbits. Paranasal sinuses and mastoids are stable and well pneumatized. Other: Grossly normal visible internal auditory structures. Stylomastoid foramina appear normal. Scalp and face soft tissues appear negative. IMPRESSION: No acute intracranial abnormality. Normal for age noncontrast MRI appearance of the brain. No explanation for ataxia. Electronically Signed   By: Genevie Ann M.D.   On: 05/20/2019 18:36    Assessment: 59 year old male with bilateral lower extremity weakness and  areflexia. 1. Exam reveals BLE weakness with hypoactive, but present, reflexes. No sensory deficit appreciated.  2. MRI brain is normal.  3. LP has been performed in the ED. CSF is being sent for protein, glucose, cell count, IgG index and oligoclonal bands.  4. DDx for the patient's BLE weakness includes Guillain-Barre syndrome and transverse myelitis. May be multifactorial due to one-year history of gradually worsening BLE weakness followed by the acute worsening on Friday.  5. DDx for transient spell of altered awareness includes seizure or metabolic/toxic encephalopathy.   Recommendations: 1. MRI of thoracic and lumbar spine with and without contrast.  2. Toxic/metabolic work up.  3. EEG in the AM (ordered)   Addendum: -- CSF WBC, protein and glucose are normal. IgG index and OCBs pending. Initial findings are not suggestive of GBS. -- MRI thoracic spine shows normal appearance of the thoracic spinal cord.  -- MRI lumbar spine shows no acute abnormality. However, there are multifactorial degenerative changes at L2-3 through L4-5 with resultant moderate to severe diffuse spinal stenosis. In association with this, there is undulation of the nerve roots of the cauda equina secondary to underlying spinal stenosis. Finding could contribute to lower extremity symptoms.  Additional recommendations: Would obtain Neurosurgery consult to assess whether he is a surgical candidate regarding the lumbar spinal sternosis  Electronically signed: Dr. Kerney Elbe 05/20/2019, 8:10 PM

## 2019-05-20 NOTE — H&P (Addendum)
Raymond [name] FTD:[id number] DOB: November 22, 1960 DOA: 05/20/2019    PCP: Benito Mccreedy, MD   Outpatient Specialists:   NONE   Patient arrived to ER on 05/20/19 at 1235  Patient coming from: home Lives  With family    Chief Complaint:   Chief Complaint  Patient presents with  . Gait Problem  . Otalgia    HPI: Raymond [name] is a 59 y.o. male with medical history significant of HTN , diverticulitis in April    Presented with sudden loss of balance and coordination occurred on Friday3 days ago.  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  He eventually went home but returned to work next day and was able to work fluid although he felt very tired.  His coworkers kept telling him that he does not appear to be back to his baseline he walks abnormally.  Patient took off yesterday from work and slept throughout the day. When he went to work today his coworkers felt that he was not able to walk and called EMS patient denies drug abuse currently.  States he does not drink alcohol No recent illness no fevers or chills no cough no nausea no vomiting  no unilateral weakness dizziness but reported he has been having left earache for a very long time, sore throat for the past week No sick contacts   Patient does smoke  He has been taking mylanta for his diverticulitis, he said his symptoms never completely went away, he has persistent loose stools  Infectious risk factors:  Reports  severe fatigue   Has    been vaccinated against COVID last shot was   4 weeks ago   in house  PCR testing  Pending  No results found for: SARSCOV2NAA   Regarding pertinent Chronic problems:     HTN on  has been off his medications for the past 5 years  Norvasc hydrochlorothiazide lisinopril   CAD he had MI 2007 have not seen a cardiologist  While in ER: He was noted to have significant weakness of the lower extremities requiring 1 person assist CT head nonacute MRI brain is abnormalities  LP was done in ER Results pending  ER Provider Called:   Neurology Dr. Rory Percy They Recommend admit to medicine  Obtain MRI lower spine If unremarkable may need LP Will see   in ER  Hospitalist was called for admission for Ataxia, bilateral leg weakness, decreased reflexes   The following Work up has been ordered so far:  Orders Placed This Encounter  Procedures  . Gram stain  . Group B strep by PCR  . CT HEAD WO CONTRAST  . MR BRAIN WO CONTRAST  . Protime-INR  . APTT  . CBC  . Differential  . Comprehensive metabolic panel  . Ethanol  . Urine rapid drug screen (hosp performed)  . Urinalysis, Routine w reflex microscopic  . CSF cell count with differential collection tube #: 1  . CSF cell count with differential collection tube #: 4  . Glucose, CSF  . Protein, CSF  . IgG CSF index  . Oligoclonal bands, CSF + serum  .  Draw extra clot tube  . Draw extra clot tube  . Diet NPO time specified  . Cardiac monitoring  . Stroke swallow screen  . NIH Stroke Scale  . Modified Stroke Scale (mNIHSS) Document mNIHSS assessment every 2 hours for a total of 12 hours  . Saline Lock IV, Maintain IV access  . If O2 Sat <94% administer O2 at 2 liters/minute via nasal cannula  . CT not indicated at this time  . Vital signs  . Initiate Carrier Fluid Protocol  . Consult to hospitalist  ALL PATIENTS BEING ADMITTED/HAVING PROCEDURES NEED COVID-19 SCREENING  . Pulse oximetry, continuous  . I-stat chem 8, ED  . ED EKG   Following Medications were ordered in ER: Medications  sodium chloride flush (NS) 0.9 % injection 3 mL (has no administration in time range)  acetaminophen (TYLENOL) tablet 650 mg  (has no administration in time range)        Consult Orders  (From admission, onward)         Start     Ordered   05/20/19 1955  Consult to hospitalist  ALL PATIENTS BEING ADMITTED/HAVING PROCEDURES NEED COVID-19 SCREENING Paged by Jerene Pitch  Once    Comments: ALL PATIENTS BEING ADMITTED/HAVING PROCEDURES NEED COVID-19 SCREENING  Provider:  (Not yet assigned)  Question Answer Comment  Place call to: [facility]   Reason for Consult Admit      05/20/19 1954          Significant initial  Findings: Abnormal Labs Reviewed  CBC - Abnormal; Notable for the following components:      Result Value   WBC 10.6 (*)    All other components within normal limits  DIFFERENTIAL - Abnormal; Notable for the following components:   Monocytes Absolute 1.2 (*)    All other components within normal limits  COMPREHENSIVE METABOLIC PANEL - Abnormal; Notable for the following components:   Glucose, Bld 115 (*)    All other components within normal limits   Otherwise labs showing:    Recent Labs  Lab 05/20/19 1324 05/20/19 1941  NA 139 137  K 3.7 3.8  CO2 27  --   GLUCOSE 115* 98  BUN 10 13  CREATININE 1.19 1.00  CALCIUM 9.6  --     Cr   stable,   Lab Results  Component Value Date   CREATININE 1.00 05/20/2019   CREATININE 1.19 05/20/2019   CREATININE 0.89 04/10/2019    Recent Labs  Lab 05/20/19 1324  AST 27  ALT 23  ALKPHOS 87  BILITOT 0.5  PROT 7.2  ALBUMIN 3.9   Lab Results  Component Value Date   CALCIUM 9.6 05/20/2019     WBC      Component Value Date/Time   WBC 10.6 (H) 05/20/2019 1324   ANC    Component Value Date/Time   NEUTROABS 7.6 05/20/2019 1324   ALC No components found for: LYMPHAB    Plt: Lab Results  Component Value Date   PLT 236 05/20/2019      COVID-19 Labs  No results for input(s): DDIMER, FERRITIN, LDH, CRP in the last 72 hours.  No results found for: SARSCOV2NAA     HG/HCT stable,      Component Value Date/Time    HGB 15.3 05/20/2019 1941   HCT 45.0 05/20/2019 1941     ECG: Ordered Personally reviewed by me showing: HR : 81 Rhythm:  NSR,   nonspecific changes, T wave inversion QTC 394  UA no UTI   Urine analysis:    Component Value Date/Time   COLORURINE YELLOW 05/20/2019 1659   APPEARANCEUR CLEAR 05/20/2019 1659   LABSPEC 1.016 05/20/2019 1659   PHURINE 6.0 05/20/2019 1659   GLUCOSEU NEGATIVE 05/20/2019 1659   HGBUR NEGATIVE 05/20/2019 1659   HGBUR moderate 10/17/2007 1442   BILIRUBINUR NEGATIVE 05/20/2019 [place] 05/20/2019 1659   PROTEINUR NEGATIVE 05/20/2019 1659   UROBILINOGEN 1.0 07/17/2013 1048   NITRITE NEGATIVE 05/20/2019 [place] 05/20/2019 1659      MRI thoracic/lumbar -Ordered  CT HEAD  NON acute  CXR -  NON acute  MRI brain - non acute    ED Triage Vitals [05/20/19 1319]  Enc Vitals Group     BP (!) 165/89     Pulse Rate 84     Resp 20     Temp 99.4 F (37.4 C)     Temp Source Oral     SpO2 99 %     Weight 290 lb (131.5 kg)     Height 5\' 10"  (1.778 m)     Head Circumference      Peak Flow      Pain Score 9     Pain Loc      Pain Edu?      Excl. in [place]?   HWEX(93)@       Latest  Blood pressure (!) 158/88, pulse 81, temperature 99.4 F (37.4 C), temperature source Oral, resp. rate 18, height 5\' 10"  (1.778 m), weight 131.5 kg, SpO2 96 %.    Review of Systems:    Pertinent positives include: lower ext weakness,  localizing neurological complaints,   gait abnormality Constitutional:  No weight loss, night sweats, Fevers, chills, fatigue, weight loss  HEENT:  No headaches, Difficulty swallowing,Tooth/dental problems,Sore throat,  No sneezing, itching, ear ache, nasal congestion, post nasal drip,  Cardio-vascular:  No chest pain, Orthopnea, PND, anasarca, dizziness, palpitations.no Bilateral lower extremity swelling  GI:  No heartburn, indigestion, abdominal pain, nausea, vomiting, diarrhea, change in bowel  habits, loss of appetite, melena, blood in stool, hematemesis Resp:  no shortness of breath at rest. No dyspnea on exertion, No excess mucus, no productive cough, No non-productive cough, No coughing up of blood.No change in color of mucus.No wheezing. Skin:  no rash or lesions. No jaundice GU:  no dysuria, change in color of urine, no urgency or frequency. No straining to urinate.  No flank pain.  Musculoskeletal:  No joint pain or no joint swelling. No decreased range of motion. No back pain.  Psych:  No change in mood or affect. No depression or anxiety. No memory loss.  Neuro: nono tingling, no weakness, no double vision, no, no slurred speech, no confusion  All systems reviewed and apart from [place] all are negative  Past Medical History:   Past Medical History:  Diagnosis Date  . Arthritis   . Baker's cyst    right  . Diverticulitis   . Heart attack ([place]) 2006  . Hypertension      Past Surgical History:  Procedure Laterality Date  . CARDIAC CATHETERIZATION    . CARPAL TUNNEL RELEASE     right  . HAND SURGERY     left  . KNEE SURGERY     left    Social History:  Ambulatory  independently      reports that he has been smoking. He has never used smokeless tobacco. He reports that he does  not drink alcohol or use drugs.   Family History:   Family History  Problem Relation Age of Onset  . Lung cancer Mother   . Heart failure Sister     Allergies: No Known Allergies   Prior to Admission medications   Medication Sig Start Date End Date Taking? Authorizing Provider  amLODipine (NORVASC) 5 MG tablet Take 1 tablet (5 mg total) by mouth daily. Reported on 02/28/2015 03/02/15   Cristal Ford, DO  hydrochlorothiazide (HYDRODIURIL) 25 MG tablet Take 1 tablet (25 mg total) by mouth daily. 08/04/16   Robinson, [place] N, PA-C  HYDROcodone-acetaminophen (NORCO/VICODIN) 5-325 MG tablet Take 1 tablet by mouth every 4 (four) hours as needed. 04/10/19   Maudie Flakes, MD    lisinopril (PRINIVIL,ZESTRIL) 20 MG tablet Take 1 tablet (20 mg total) by mouth daily. 08/04/16   Robinson, [place] N, PA-C  lisinopril-hydrochlorothiazide (PRINZIDE,ZESTORETIC) 20-25 MG tablet Take 1 tablet by mouth daily.    [provider]  naproxen (NAPROSYN) 375 MG tablet Take 1 tablet (375 mg total) by mouth 2 (two) times daily with a meal. 04/10/19   Bero, Barth Kirks, MD  omeprazole (PRILOSEC) 20 MG capsule Take 1 capsule (20 mg total) by mouth daily. Reported on 02/28/2015 Patient not taking: Reported on 08/04/2016 03/02/15   Cristal Ford, DO   Physical Exam: Blood pressure (!) 158/88, pulse 81, temperature 99.4 F (37.4 C), temperature source Oral, resp. rate 18, height 5\' 10"  (1.778 m), weight 131.5 kg, SpO2 96 %. 1. General:  in No  Acute distress    Chronically ill-appearing 2. Psychological: Alert and  Oriented 3. Head/ENT:    Dry Mucous Membranes                          Head Non traumatic, neck supple                           Poor Dentition 4. SKIN:   decreased Skin turgor,  Skin clean Dry and intact no rash 5. Heart: Regular rate and rhythm no  Murmur, no Rub or gallop 6. Lungs:   no wheezes or crackles   7. Abdomen: Soft,  non-tender, Non distended   obese  bowel sounds present 8. Lower extremities: no clubbing, cyanosis, no  edema 9. Neurologically lower extremity weakness 10. MSK: Normal range of motion   All other LABS:     Recent Labs  Lab 05/20/19 1324 05/20/19 1941  WBC 10.6*  --   NEUTROABS 7.6  --   HGB 15.0 15.3  HCT 47.0 45.0  MCV 89.0  --   PLT 236  --      Recent Labs  Lab 05/20/19 1324 05/20/19 1941  NA 139 137  K 3.7 3.8  CL 102 102  CO2 27  --   GLUCOSE 115* 98  BUN 10 13  CREATININE 1.19 1.00  CALCIUM 9.6  --      Recent Labs  Lab 05/20/19 1324  AST 27  ALT 23  ALKPHOS 87  BILITOT 0.5  PROT 7.2  ALBUMIN 3.9    Cultures:    Component Value Date/Time   SDES ABSCESS ABDOMEN 01/26/2009 0113   SPECREQUEST  01/26/2009  0113    Multiple Boils/Abscess Vancomycin, Clindamycin IMMUNE:NORM   CULT NO GROWTH 3 DAYS 01/26/2009 0113   REPTSTATUS 01/29/2009 FINAL 01/26/2009 0113     Radiological Exams on Admission: CT HEAD WO CONTRAST  Result Date: 05/20/2019 CLINICAL DATA:  Ataxia, stroke suspected, loss of balance EXAM: CT HEAD WITHOUT CONTRAST TECHNIQUE: Contiguous axial images were obtained from the base of the skull through the vertex without intravenous contrast. COMPARISON:  None. FINDINGS: Brain: No evidence of acute infarction, hemorrhage, hydrocephalus, extra-axial collection or mass lesion/mass effect. Vascular: Atherosclerotic calcification of the carotid siphons. No hyperdense vessel. Skull: No calvarial fracture or suspicious osseous lesion. No scalp swelling or hematoma. Sinuses/Orbits: Paranasal sinuses and mastoid air cells are predominantly clear. Included orbital structures are unremarkable. Other: None IMPRESSION: No acute intracranial findings. If there is persisting clinical concern for infarct, MRI is more sensitive and specific for early changes of ischemia. Electronically Signed   By: Lovena Le M.D.   On: 05/20/2019 17:59   MR BRAIN WO CONTRAST  Result Date: 05/20/2019 CLINICAL DATA:  59 year old male with ataxia, loss of balance. EXAM: MRI HEAD WITHOUT CONTRAST TECHNIQUE: Multiplanar, multiecho pulse sequences of the brain and surrounding structures were obtained without intravenous contrast. COMPARISON:  Head CT 1748 hours today. FINDINGS: Brain: No restricted diffusion to suggest acute infarction. No midline shift, mass effect, evidence of mass lesion, ventriculomegaly, extra-axial collection or acute intracranial hemorrhage. Cervicomedullary junction and pituitary are within normal limits. Pearline Cables and white matter signal is within normal limits for age throughout the brain. No cortical encephalomalacia or chronic cerebral blood products identified. Vascular: Major intracranial vascular flow voids  are preserved. Skull and upper cervical spine: Negative visible cervical spine, bone marrow signal. Sinuses/Orbits: Mildly Disconjugate gaze but otherwise negative orbits. Paranasal sinuses and mastoids are stable and well pneumatized. Other: Grossly normal visible internal auditory structures. Stylomastoid foramina appear normal. Scalp and face soft tissues appear negative. IMPRESSION: No acute intracranial abnormality. Normal for age noncontrast MRI appearance of the brain. No explanation for ataxia. Electronically Signed   By: Genevie Ann M.D.   On: 05/20/2019 18:36   MR THORACIC SPINE W WO CONTRAST  Result Date: 05/20/2019 CLINICAL DATA:  Initial evaluation for bilateral lower extremity weakness in a reflexia. EXAM: MRI THORACIC AND LUMBAR SPINE WITHOUT AND WITH CONTRAST TECHNIQUE: Multiplanar and multiecho pulse sequences of the thoracic and lumbar spine were obtained without and with intravenous contrast. CONTRAST:  67mL GADAVIST GADOBUTROL 1 MMOL/ML IV SOLN COMPARISON:  None. FINDINGS: MRI THORACIC SPINE FINDINGS Alignment: Straightening of the normal thoracic kyphosis. No listhesis or subluxation. Vertebrae: Vertebral body height maintained without evidence for acute or chronic fracture. Bone marrow signal intensity within normal limits. No discrete or worrisome osseous lesions. No abnormal marrow edema or enhancement. Cord: Signal intensity within the thoracic spinal cord is normal. Normal cord caliber and morphology. No abnormal enhancement. No epidural collections. Paraspinal and other soft tissues: Paraspinous soft tissues within normal limits. Visualized lungs are grossly clear. Visualized visceral structures within normal limits. Disc levels: Multilevel degenerative spondylosis noted within the cervical spine on counter sequence without high-grade stenosis. T1-2:  Normal interspace.  Mild facet hypertrophy.  No stenosis. T2-3: Mild disc bulge.  No canal or foraminal stenosis. T3-4:  Unremarkable. T4-5:   Unremarkable. T5-6:  Unremarkable. T6-7:  Minimal disc bulge.  No canal or foraminal stenosis. T7-8:  Mild diffuse disc bulge.  No canal or foraminal stenosis. T8-9: Mild diffuse disc bulge. Superimposed right subarticular to foraminal disc protrusion (series 25, image 22). No significant spinal stenosis or cord deformity. Foramina remain patent. T9-10: Mild diffuse disc bulge. Posterior element hypertrophy. No significant canal or foraminal stenosis. T10-11: Mild diffuse disc bulge. Left greater than right facet hypertrophy.  No significant spinal stenosis. Moderate left foraminal narrowing. T11-12: Mild diffuse disc bulge with facet hypertrophy. No spinal stenosis. Mild-to-moderate left neural foraminal narrowing. T12-L1:  Unremarkable. MRI LUMBAR SPINE FINDINGS Segmentation: Standard. Lowest well-formed disc space labeled the L5-S1 level. Alignment: Straightening of the normal lumbar lordosis with underlying trace levoscoliosis. No listhesis. Vertebrae: Vertebral body height maintained without evidence for acute or chronic fracture. Bone marrow signal intensity within normal limits. No discrete or worrisome osseous lesions. Prominent discogenic reactive endplate changes present about the L3-4 and L4-5 interspaces. No other abnormal marrow edema or enhancement. No evidence for osteomyelitis discitis or septic arthritis. Conus medullaris: Extends to the L1-2 level and appears normal. Undulation of the nerve roots of the cauda equina related to spinal stenosis at L2-3 through L4-5 noted. No abnormal nerve root enhancement. No epidural collections. Paraspinal and other soft tissues: Paraspinous soft tissues within normal limits. Visualized visceral structures are normal. Disc levels: L1-2: Normal interspace. Mild left greater than right facet hypertrophy. No canal or foraminal stenosis. L2-3: Diffuse disc bulge with disc desiccation and intervertebral disc space narrowing. Mild facet and ligament flavum hypertrophy.  Resultant moderate spinal stenosis. Foramina remain patent. L3-4: Advance degenerative intervertebral disc space narrowing with diffuse disc bulge and disc desiccation. Reactive endplate changes with marginal endplate osteophytic spurring. Mild to moderate facet hypertrophy. Trace joint effusion noted on the left. Epidural lipomatosis. Resultant moderate to severe canal with bilateral subarticular stenosis. Moderate bilateral L3 foraminal narrowing. L4-5: Chronic intervertebral disc space narrowing with diffuse disc bulge and disc desiccation. Reactive endplate changes with marginal endplate osteophytic spurring. Moderate facet hypertrophy. Epidural lipomatosis. Resultant moderate to severe spinal stenosis, with moderate bilateral L4 foraminal narrowing, worse on the right. L5-S1: Mild disc desiccation without significant disc bulge. Moderate facet hypertrophy. Epidural lipomatosis. No canal or lateral recess stenosis. Foramina remain patent. IMPRESSION: MRI THORACIC SPINE IMPRESSION: 1. No acute abnormality within the thoracic spine with normal MRI appearance of the thoracic spinal cord. No findings to explain patient's symptoms identified. 2. Multilevel degenerative disc bulging throughout the mid and lower thoracic spine as above without significant spinal stenosis or cord impingement. Mild to moderate left foraminal narrowing at T10-11 and T11-12 related to disc bulge and facet hypertrophy. No other significant foraminal encroachment. MRI LUMBAR SPINE IMPRESSION: 1. No acute abnormality within the lumbar spine. 2. Multifactorial degenerative changes at L2-3 through L4-5 with resultant moderate to severe diffuse spinal stenosis, with moderate bilateral L3 and L4 foraminal narrowing. 3. Undo lay shin of the nerve roots of the cauda equina secondary to underlying spinal stenosis. Finding could contribute to lower extremity symptoms. No abnormal nerve root enhancement or other acute abnormality. Electronically  Signed   By: Jeannine Boga M.D.   On: 05/20/2019 23:14   MR Lumbar Spine W Wo Contrast  Result Date: 05/20/2019 CLINICAL DATA:  Initial evaluation for bilateral lower extremity weakness in a reflexia. EXAM: MRI THORACIC AND LUMBAR SPINE WITHOUT AND WITH CONTRAST TECHNIQUE: Multiplanar and multiecho pulse sequences of the thoracic and lumbar spine were obtained without and with intravenous contrast. CONTRAST:  26mL GADAVIST GADOBUTROL 1 MMOL/ML IV SOLN COMPARISON:  None. FINDINGS: MRI THORACIC SPINE FINDINGS Alignment: Straightening of the normal thoracic kyphosis. No listhesis or subluxation. Vertebrae: Vertebral body height maintained without evidence for acute or chronic fracture. Bone marrow signal intensity within normal limits. No discrete or worrisome osseous lesions. No abnormal marrow edema or enhancement. Cord: Signal intensity within the thoracic spinal cord is normal. Normal cord caliber and morphology. No  abnormal enhancement. No epidural collections. Paraspinal and other soft tissues: Paraspinous soft tissues within normal limits. Visualized lungs are grossly clear. Visualized visceral structures within normal limits. Disc levels: Multilevel degenerative spondylosis noted within the cervical spine on counter sequence without high-grade stenosis. T1-2:  Normal interspace.  Mild facet hypertrophy.  No stenosis. T2-3: Mild disc bulge.  No canal or foraminal stenosis. T3-4:  Unremarkable. T4-5:  Unremarkable. T5-6:  Unremarkable. T6-7:  Minimal disc bulge.  No canal or foraminal stenosis. T7-8:  Mild diffuse disc bulge.  No canal or foraminal stenosis. T8-9: Mild diffuse disc bulge. Superimposed right subarticular to foraminal disc protrusion (series 25, image 22). No significant spinal stenosis or cord deformity. Foramina remain patent. T9-10: Mild diffuse disc bulge. Posterior element hypertrophy. No significant canal or foraminal stenosis. T10-11: Mild diffuse disc bulge. Left greater than  right facet hypertrophy. No significant spinal stenosis. Moderate left foraminal narrowing. T11-12: Mild diffuse disc bulge with facet hypertrophy. No spinal stenosis. Mild-to-moderate left neural foraminal narrowing. T12-L1:  Unremarkable. MRI LUMBAR SPINE FINDINGS Segmentation: Standard. Lowest well-formed disc space labeled the L5-S1 level. Alignment: Straightening of the normal lumbar lordosis with underlying trace levoscoliosis. No listhesis. Vertebrae: Vertebral body height maintained without evidence for acute or chronic fracture. Bone marrow signal intensity within normal limits. No discrete or worrisome osseous lesions. Prominent discogenic reactive endplate changes present about the L3-4 and L4-5 interspaces. No other abnormal marrow edema or enhancement. No evidence for osteomyelitis discitis or septic arthritis. Conus medullaris: Extends to the L1-2 level and appears normal. Undulation of the nerve roots of the cauda equina related to spinal stenosis at L2-3 through L4-5 noted. No abnormal nerve root enhancement. No epidural collections. Paraspinal and other soft tissues: Paraspinous soft tissues within normal limits. Visualized visceral structures are normal. Disc levels: L1-2: Normal interspace. Mild left greater than right facet hypertrophy. No canal or foraminal stenosis. L2-3: Diffuse disc bulge with disc desiccation and intervertebral disc space narrowing. Mild facet and ligament flavum hypertrophy. Resultant moderate spinal stenosis. Foramina remain patent. L3-4: Advance degenerative intervertebral disc space narrowing with diffuse disc bulge and disc desiccation. Reactive endplate changes with marginal endplate osteophytic spurring. Mild to moderate facet hypertrophy. Trace joint effusion noted on the left. Epidural lipomatosis. Resultant moderate to severe canal with bilateral subarticular stenosis. Moderate bilateral L3 foraminal narrowing. L4-5: Chronic intervertebral disc space narrowing with  diffuse disc bulge and disc desiccation. Reactive endplate changes with marginal endplate osteophytic spurring. Moderate facet hypertrophy. Epidural lipomatosis. Resultant moderate to severe spinal stenosis, with moderate bilateral L4 foraminal narrowing, worse on the right. L5-S1: Mild disc desiccation without significant disc bulge. Moderate facet hypertrophy. Epidural lipomatosis. No canal or lateral recess stenosis. Foramina remain patent. IMPRESSION: MRI THORACIC SPINE IMPRESSION: 1. No acute abnormality within the thoracic spine with normal MRI appearance of the thoracic spinal cord. No findings to explain patient's symptoms identified. 2. Multilevel degenerative disc bulging throughout the mid and lower thoracic spine as above without significant spinal stenosis or cord impingement. Mild to moderate left foraminal narrowing at T10-11 and T11-12 related to disc bulge and facet hypertrophy. No other significant foraminal encroachment. MRI LUMBAR SPINE IMPRESSION: 1. No acute abnormality within the lumbar spine. 2. Multifactorial degenerative changes at L2-3 through L4-5 with resultant moderate to severe diffuse spinal stenosis, with moderate bilateral L3 and L4 foraminal narrowing. 3. Undo lay shin of the nerve roots of the cauda equina secondary to underlying spinal stenosis. Finding could contribute to lower extremity symptoms. No abnormal nerve root enhancement  or other acute abnormality. Electronically Signed   By: Jeannine Boga M.D.   On: 05/20/2019 23:14   DG Chest Port 1 View  Result Date: 05/20/2019 CLINICAL DATA:  Weakness EXAM: PORTABLE CHEST 1 VIEW COMPARISON:  07/06/2005 FINDINGS: Heart and mediastinal contours are within normal limits. No focal opacities or effusions. No acute bony abnormality. IMPRESSION: No active disease. Electronically Signed   By: Rolm Baptise M.D.   On: 05/20/2019 22:46    Chart has been reviewed   Assessment/Plan  59 y.o. male with medical history  significant of HTN , diverticulosis    Admitted for lower ext weakness likely due to spinal stenosis, abnormal ecg and possible syncope  Present on Admission: . Paraplegia, incomplete ([place]) -MRI of the spine showed spinal stenosis No abnormal nerve root enhancement but it is possible that significant spinal stenosis has contributed to patient developing his symptoms. Will need Neurosurgical consult in AM  . Obesity, unspecified -chronic will need nutritional assessment  . TOBACCO ABUSE -  Spoke about importance of quitting spent 5 minutes discussing options for treatment, prior attempts at quitting, and dangers of smoking  -At this point patient is  interested in quitting  - order nicotine patch   - nursing tobacco cessation protocol  . HYPERTENSION, BENIGN - pt has not been taking his BP meds Can slowly restart  . Abnormal EKG - no chest pain or shortness of breath, Pt had an episode 3 days ago where he cannot recollect what happened but not true syncope. Will obtain echo and repeat ECG   Possible syncope - unclear etiology MRI brain showing no CVA Will obtain echo given abnormal ecg  Hx of Diverticulitis - was treated with Augmentin in the beginning of April was mild at the time but has persistent symptoms and now developed significant pain and nausea with vomiting, will keep NPO and repeat CT abd Pt appeared non-toxic on my initial evaluation  Other plan as per orders.  DVT prophylaxis:  SCD   Code Status:  FULL CODE   as per patient  I had personally discussed CODE STATUS with patient    Family Communication:   Family at  Bedside  plan of care was discussed on the phone with   Wife   Disposition Plan:   likely will need placement for rehabilitation                            Following barriers for discharge:                                                           Will likely need home health, home O2, set up                           Will need consultants to evaluate  patient prior to discharge                    Would benefit from PT/OT eval prior to DC  Ordered                    Transition of care consulted  Consults called: neurology  Consulted and seen pt, will need neurosurgery consult in AM  Admission status:  ED Disposition    ED Disposition Condition [place]: [place] [[id number]]  Level of Care: Telemetry Medical [104]  May admit patient to [facility] or [facility] if equivalent level of care is available:: No  Covid Evaluation: Asymptomatic Screening Protocol (No Symptoms)  Diagnosis: Paraplegia, incomplete [facility]) [[id number]]  Admitting Physician: Toy Baker [[id number]]  Attending Physician: Toy Baker [[id number]]  Estimated length of stay: past midnight tomorrow  Certification:: I certify this patient will need inpatient services for at least 2 midnights           inpatient     I Expect 2 midnight stay secondary to severity of patient's current illness need for inpatient interventions justified by the following:  Severe lab/radiological/exam abnormalities including:  New onset paraplegia   and extensive comorbidities including:  Morbid Obesity  That are currently affecting medical management.   I expect  patient to be hospitalized for 2 midnights requiring inpatient medical care.  Patient is at high risk for adverse outcome (such as loss of life or disability) if not treated.  Indication for inpatient stay as follows:    Need for operative/procedural  intervention     Level of care     Tele for  indefinitely please discontinue once patient no longer qualifies   Precautions: admitted as asymptomatic screening protocol   PPE: Used by the provider:   P100  eye Goggles,  Gloves    Genisis Sonnier 05/21/2019, 1:06 AM    [facility]     after 2 AM please page floor coverage PA If 7AM-7PM, please contact the day team taking care  of the patient using [email or web address]   Patient was evaluated in the context of the global COVID-19 pandemic, which necessitated consideration that the patient might be at risk for infection with the SARS-CoV-2 virus that causes COVID-19. Institutional protocols and algorithms that pertain to the evaluation of patients at risk for COVID-19 are in a state of rapid change based on information released by regulatory bodies including the CDC and federal and state organizations. These policies and algorithms were followed during the patient's care.

## 2019-05-20 NOTE — ED Provider Notes (Signed)
[facility] EMERGENCY DEPARTMENT Provider Note   CSN: [id number] Arrival date & time: 05/20/19  1235     History Chief Complaint  Patient presents with  . Gait Problem  . Otalgia    Raymond [name] is a 59 y.o. male.  Who presents emergency department for transient alteration in awareness and bilateral lower extremity weakness.  3 days ago, Friday 14 May, the patient was at work.  He states that he remembers being aware around 2 PM and then the next thing he remembers two of his coworkers were trying to lift him under his arms and walk him to be seated.  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  He has no recollection of this event.  He noticed that he is having some difficulty walking and states that his legs felt "really heavy."  The patient was driven home but returned to work the next day for a 12-hour shift.  He states that he was able to get through it but felt exhausted and "just really funny."  He was able to ambulate but his coworkers kept commenting that he did not look right and that he was walking funny.  The patient took Sunday off and slept most of the day.  Patient returned to work today and again his coworkers noticed that he is having a lot of difficulty walking.  The nurse on staff came to [name] him and called EMS and sent him to the hospital.  He has a past medical history of hypertension.  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  He does not drink alcohol or use drugs.  He had his second maternal Covid shot about 4 weeks ago.  He has had no other significant illnesses recently.  He denies any new medications.  He denies fevers, chills, urinary symptoms, nausea, vomiting.  HPI     Past Medical History:  Diagnosis Date  . Arthritis   . Baker's cyst    right  . Diverticulitis   . Heart attack ([place]) 2006  . Hypertension     Patient Active Problem List   Diagnosis Date Noted    . Hx of diverticulitis of colon 05/21/2019  . 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 Baker cyst 02/28/2015  . Chest pain, exertional 02/28/2015  . Baker's cyst 02/28/2015  . Abnormal EKG 02/28/2015  . MICROSCOPIC HEMATURIA 10/17/2007  . ABDOMINAL PAIN 10/17/2007  . HEMATURIA, HX OF 10/17/2007  . PSYCHOLOGICAL STRESS 05/31/2007  . Obesity, unspecified 04/19/2007  . TOBACCO ABUSE 04/19/2007  . RHINITIS, ALLERGIC NOS 04/19/2006  . HYPERTENSION, BENIGN 03/09/2006  . FATIGUE 03/09/2006  . WEIGHT GAIN 03/09/2006  . HEADACHE 03/09/2006  . ANXIETY 03/02/2006  . CHEST PAIN 03/02/2006    Past Surgical History:  Procedure Laterality Date  . CARDIAC CATHETERIZATION    . CARPAL TUNNEL RELEASE     right  . HAND SURGERY     left  . KNEE SURGERY     left       Family History  Problem Relation Age of Onset  . Lung cancer Mother   . Heart failure Sister     Social History   Tobacco Use  . Smoking status: Current Every Day Smoker  . Smokeless tobacco: Never Used  Substance Use Topics  . Alcohol use: No  . Drug use: No    Home Medications Prior to Admission medications   Medication  Sig Start Date End Date Taking? Authorizing Provider  aspirin EC 81 MG tablet Take 81 mg by mouth daily.   Yes [provider]  MULTIPLE VITAMIN PO Take 1 tablet by mouth daily.   Yes [provider]  naproxen sodium (ALEVE) 220 MG tablet Take 220 mg by mouth daily as needed (pain, headache).   Yes [provider]  amLODipine (NORVASC) 5 MG tablet Take 1 tablet (5 mg total) by mouth daily. Reported on 02/28/2015 Patient not taking: Reported on 05/20/2019 03/02/15   Cristal Ford, DO  hydrochlorothiazide (HYDRODIURIL) 25 MG tablet Take 1 tablet (25 mg total) by mouth daily. Patient not taking: Reported on 05/20/2019 08/04/16   Robinson, [place] N, PA-C  HYDROcodone-acetaminophen (NORCO/VICODIN) 5-325 MG tablet Take 1 tablet by mouth every 4 (four) hours as  needed. Patient not taking: Reported on 05/20/2019 04/10/19   Maudie Flakes, MD  lisinopril (PRINIVIL,ZESTRIL) 20 MG tablet Take 1 tablet (20 mg total) by mouth daily. Patient not taking: Reported on 05/20/2019 08/04/16   Robinson, [place] N, PA-C  naproxen (NAPROSYN) 375 MG tablet Take 1 tablet (375 mg total) by mouth 2 (two) times daily with a meal. Patient not taking: Reported on 05/20/2019 04/10/19   Maudie Flakes, MD  omeprazole (PRILOSEC) 20 MG capsule Take 1 capsule (20 mg total) by mouth daily. Reported on 02/28/2015 Patient not taking: Reported on 08/04/2016 03/02/15   Cristal Ford, DO    Allergies    Patient has no known allergies.  Review of Systems   Review of Systems Ten systems reviewed and are negative for acute change, except as noted in the HPI.   Physical Exam Updated Vital Signs BP (!) 135/120 (BP Location: Right Arm)   Pulse 97   Temp 99.1 F (37.3 C)   Resp 18   Ht 5\' 10"  (1.778 m)   Wt 131.5 kg   SpO2 94%   BMI 41.61 kg/m   Physical Exam Vitals and nursing note reviewed.  Constitutional:      General: He is not in acute distress.    Appearance: He is well-developed. He is not diaphoretic.  HENT:     Head: Normocephalic and atraumatic.  Eyes:     General: No scleral icterus.    Conjunctiva/sclera: Conjunctivae normal.     Pupils: Pupils are equal, round, and reactive to light.     Comments: Gaze disconjugate, but realigns   Cardiovascular:     Rate and Rhythm: Normal rate and regular rhythm.     Heart sounds: Normal heart sounds.  Pulmonary:     Effort: Pulmonary effort is normal. No respiratory distress.     Breath sounds: Normal breath sounds.  Abdominal:     Palpations: Abdomen is soft.     Tenderness: There is no abdominal tenderness.  Musculoskeletal:     Cervical back: Normal range of motion and neck supple.  Skin:    General: Skin is warm and dry.  Neurological:     Mental Status: He is alert.     Cranial Nerves: No cranial nerve deficit.      Sensory: No sensory deficit.     Motor: Weakness present.     Coordination: Coordination normal.     Gait: Gait abnormal.     Deep Tendon Reflexes: Reflexes abnormal.     Comments: Speech is clear and goal oriented, follows commands Major Cranial nerves without deficit (except gaze issues noted in the EYE exam), no facial droop Normal strength in upper  extremities, strong and equal grip strength 4/5 strength  In the lower extremities and with dorsiflexion and plantar flexion, UNABLE to illicit patellar reflexes Sensation normal to light  touch Moves upper extremities without ataxia, coordination intact Normal finger to nose and rapid alternating movements no pronator drift Patient uses his arms to walk himself up his thighs into an upright position. He has an abnormal gait due to weakness and requires 1 person assist   Psychiatric:        Behavior: Behavior normal.     ED Results / Procedures / Treatments   Labs (all labs ordered are listed, but only abnormal results are displayed) Labs Reviewed  CBC - Abnormal; Notable for the following components:      Result Value   WBC 10.6 (*)    All other components within normal limits  DIFFERENTIAL - Abnormal; Notable for the following components:   Monocytes Absolute 1.2 (*)    All other components within normal limits  COMPREHENSIVE METABOLIC PANEL - Abnormal; Notable for the following components:   Glucose, Bld 115 (*)    All other components within normal limits  CSF CELL COUNT WITH DIFFERENTIAL - Abnormal; Notable for the following components:   RBC Count, CSF 1 (*)    All other components within normal limits  CBC WITH DIFFERENTIAL/PLATELET - Abnormal; Notable for the following components:   WBC 13.1 (*)    Neutro Abs 9.6 (*)    Monocytes Absolute 1.7 (*)    All other components within normal limits  COMPREHENSIVE METABOLIC PANEL - Abnormal; Notable for the following components:   Glucose, Bld 126 (*)    Albumin 3.4 (*)     All other components within normal limits  GRAM STAIN  SARS CORONAVIRUS 2 BY RT PCR (HOSPITAL ORDER, [facility])  PROTIME-INR  APTT  ETHANOL  RAPID URINE DRUG SCREEN, HOSP PERFORMED  URINALYSIS, ROUTINE W REFLEX MICROSCOPIC  CSF CELL COUNT WITH DIFFERENTIAL  GLUCOSE, CSF  PROTEIN, CSF  HIV ANTIBODY (ROUTINE TESTING W REFLEX)  MAGNESIUM  PHOSPHORUS  TSH  CK  IGG CSF INDEX  OLIGOCLONAL BANDS, CSF + SERM  DRAW EXTRA CLOT TUBE  DRAW EXTRA CLOT TUBE  HEMOGLOBIN A1C  CBC  COMPREHENSIVE METABOLIC PANEL  I-STAT CHEM 8, ED  TROPONIN I (HIGH SENSITIVITY)    EKG EKG Interpretation  Date/Time:  Monday May 20 2019 13:23:56 EDT Ventricular Rate:  81 PR Interval:  176 QRS Duration: 96 QT Interval:  340 QTC Calculation: 394 R Axis:   -21 Text Interpretation: Normal sinus rhythm Septal infarct , age undetermined Abnormal ECG t wave changes new since last tracing Confirmed by Dorie Rank [phone]) on 05/20/2019 4:59:27 PM   Radiology EEG  Result Date: 05/21/2019 Lora Havens, MD     05/21/2019  9:39 AM Patient Name: AUDRA BELLARD MRN: [id number] Epilepsy Attending: Lora Havens Referring Physician/Provider: Dr Leonie Man Date: 05/21/2019 Duration: 29.26 mins Patient history: 59yo M with transient spell of altered awareness. EEG to evaluate for seizure Level of alertness: Awake, asleep AEDs during EEG study: None Technical aspects: This EEG study was done with scalp electrodes positioned according to the 10-20 International system of electrode placement. Electrical activity was acquired at a sampling rate of 500Hz  and reviewed with a high frequency filter of 70Hz  and a low frequency filter of 1Hz . EEG data were recorded continuously and digitally stored. Description: The posterior dominant rhythm consists of 9-10 Hz activity of moderate voltage (25-35 uV)  seen predominantly in posterior head regions, symmetric and reactive to eye opening and eye  closing. Sleep was characterized by vertex waves, sleep spindles (12 to 14 Hz), maximal frontocentral region.  Hyperventilation and photic stimulation were not performed.   IMPRESSION: This study is within normal limits. No seizures or epileptiform discharges were seen throughout the recording. Lora Havens   CT HEAD WO CONTRAST  Result Date: 05/20/2019 CLINICAL DATA:  Ataxia, stroke suspected, loss of balance EXAM: CT HEAD WITHOUT CONTRAST TECHNIQUE: Contiguous axial images were obtained from the base of the skull through the vertex without intravenous contrast. COMPARISON:  None. FINDINGS: Brain: No evidence of acute infarction, hemorrhage, hydrocephalus, extra-axial collection or mass lesion/mass effect. Vascular: Atherosclerotic calcification of the carotid siphons. No hyperdense vessel. Skull: No calvarial fracture or suspicious osseous lesion. No scalp swelling or hematoma. Sinuses/Orbits: Paranasal sinuses and mastoid air cells are predominantly clear. Included orbital structures are unremarkable. Other: None IMPRESSION: No acute intracranial findings. If there is persisting clinical concern for infarct, MRI is more sensitive and specific for early changes of ischemia. Electronically Signed   By: Lovena Le M.D.   On: 05/20/2019 17:59   MR BRAIN WO CONTRAST  Result Date: 05/20/2019 CLINICAL DATA:  59 year old male with ataxia, loss of balance. EXAM: MRI HEAD WITHOUT CONTRAST TECHNIQUE: Multiplanar, multiecho pulse sequences of the brain and surrounding structures were obtained without intravenous contrast. COMPARISON:  Head CT 1748 hours today. FINDINGS: Brain: No restricted diffusion to suggest acute infarction. No midline shift, mass effect, evidence of mass lesion, ventriculomegaly, extra-axial collection or acute intracranial hemorrhage. Cervicomedullary junction and pituitary are within normal limits. Pearline Cables and white matter signal is within normal limits for age throughout the brain. No  cortical encephalomalacia or chronic cerebral blood products identified. Vascular: Major intracranial vascular flow voids are preserved. Skull and upper cervical spine: Negative visible cervical spine, bone marrow signal. Sinuses/Orbits: Mildly Disconjugate gaze but otherwise negative orbits. Paranasal sinuses and mastoids are stable and well pneumatized. Other: Grossly normal visible internal auditory structures. Stylomastoid foramina appear normal. Scalp and face soft tissues appear negative. IMPRESSION: No acute intracranial abnormality. Normal for age noncontrast MRI appearance of the brain. No explanation for ataxia. Electronically Signed   By: Genevie Ann M.D.   On: 05/20/2019 18:36   MR THORACIC SPINE W WO CONTRAST  Result Date: 05/20/2019 CLINICAL DATA:  Initial evaluation for bilateral lower extremity weakness in a reflexia. EXAM: MRI THORACIC AND LUMBAR SPINE WITHOUT AND WITH CONTRAST TECHNIQUE: Multiplanar and multiecho pulse sequences of the thoracic and lumbar spine were obtained without and with intravenous contrast. CONTRAST:  48mL GADAVIST GADOBUTROL 1 MMOL/ML IV SOLN COMPARISON:  None. FINDINGS: MRI THORACIC SPINE FINDINGS Alignment: Straightening of the normal thoracic kyphosis. No listhesis or subluxation. Vertebrae: Vertebral body height maintained without evidence for acute or chronic fracture. Bone marrow signal intensity within normal limits. No discrete or worrisome osseous lesions. No abnormal marrow edema or enhancement. Cord: Signal intensity within the thoracic spinal cord is normal. Normal cord caliber and morphology. No abnormal enhancement. No epidural collections. Paraspinal and other soft tissues: Paraspinous soft tissues within normal limits. Visualized lungs are grossly clear. Visualized visceral structures within normal limits. Disc levels: Multilevel degenerative spondylosis noted within the cervical spine on counter sequence without high-grade stenosis. T1-2:  Normal interspace.   Mild facet hypertrophy.  No stenosis. T2-3: Mild disc bulge.  No canal or foraminal stenosis. T3-4:  Unremarkable. T4-5:  Unremarkable. T5-6:  Unremarkable. T6-7:  Minimal disc bulge.  No canal or foraminal stenosis. T7-8:  Mild diffuse disc bulge.  No canal or foraminal stenosis. T8-9: Mild diffuse disc bulge. Superimposed right subarticular to foraminal disc protrusion (series 25, image 22). No significant spinal stenosis or cord deformity. Foramina remain patent. T9-10: Mild diffuse disc bulge. Posterior element hypertrophy. No significant canal or foraminal stenosis. T10-11: Mild diffuse disc bulge. Left greater than right facet hypertrophy. No significant spinal stenosis. Moderate left foraminal narrowing. T11-12: Mild diffuse disc bulge with facet hypertrophy. No spinal stenosis. Mild-to-moderate left neural foraminal narrowing. T12-L1:  Unremarkable. MRI LUMBAR SPINE FINDINGS Segmentation: Standard. Lowest well-formed disc space labeled the L5-S1 level. Alignment: Straightening of the normal lumbar lordosis with underlying trace levoscoliosis. No listhesis. Vertebrae: Vertebral body height maintained without evidence for acute or chronic fracture. Bone marrow signal intensity within normal limits. No discrete or worrisome osseous lesions. Prominent discogenic reactive endplate changes present about the L3-4 and L4-5 interspaces. No other abnormal marrow edema or enhancement. No evidence for osteomyelitis discitis or septic arthritis. Conus medullaris: Extends to the L1-2 level and appears normal. Undulation of the nerve roots of the cauda equina related to spinal stenosis at L2-3 through L4-5 noted. No abnormal nerve root enhancement. No epidural collections. Paraspinal and other soft tissues: Paraspinous soft tissues within normal limits. Visualized visceral structures are normal. Disc levels: L1-2: Normal interspace. Mild left greater than right facet hypertrophy. No canal or foraminal stenosis. L2-3:  Diffuse disc bulge with disc desiccation and intervertebral disc space narrowing. Mild facet and ligament flavum hypertrophy. Resultant moderate spinal stenosis. Foramina remain patent. L3-4: Advance degenerative intervertebral disc space narrowing with diffuse disc bulge and disc desiccation. Reactive endplate changes with marginal endplate osteophytic spurring. Mild to moderate facet hypertrophy. Trace joint effusion noted on the left. Epidural lipomatosis. Resultant moderate to severe canal with bilateral subarticular stenosis. Moderate bilateral L3 foraminal narrowing. L4-5: Chronic intervertebral disc space narrowing with diffuse disc bulge and disc desiccation. Reactive endplate changes with marginal endplate osteophytic spurring. Moderate facet hypertrophy. Epidural lipomatosis. Resultant moderate to severe spinal stenosis, with moderate bilateral L4 foraminal narrowing, worse on the right. L5-S1: Mild disc desiccation without significant disc bulge. Moderate facet hypertrophy. Epidural lipomatosis. No canal or lateral recess stenosis. Foramina remain patent. IMPRESSION: MRI THORACIC SPINE IMPRESSION: 1. No acute abnormality within the thoracic spine with normal MRI appearance of the thoracic spinal cord. No findings to explain patient's symptoms identified. 2. Multilevel degenerative disc bulging throughout the mid and lower thoracic spine as above without significant spinal stenosis or cord impingement. Mild to moderate left foraminal narrowing at T10-11 and T11-12 related to disc bulge and facet hypertrophy. No other significant foraminal encroachment. MRI LUMBAR SPINE IMPRESSION: 1. No acute abnormality within the lumbar spine. 2. Multifactorial degenerative changes at L2-3 through L4-5 with resultant moderate to severe diffuse spinal stenosis, with moderate bilateral L3 and L4 foraminal narrowing. 3. Undo lay shin of the nerve roots of the cauda equina secondary to underlying spinal stenosis. Finding  could contribute to lower extremity symptoms. No abnormal nerve root enhancement or other acute abnormality. Electronically Signed   By: Jeannine Boga M.D.   On: 05/20/2019 23:14   MR Lumbar Spine W Wo Contrast  Result Date: 05/20/2019 CLINICAL DATA:  Initial evaluation for bilateral lower extremity weakness in a reflexia. EXAM: MRI THORACIC AND LUMBAR SPINE WITHOUT AND WITH CONTRAST TECHNIQUE: Multiplanar and multiecho pulse sequences of the thoracic and lumbar spine were obtained without and with intravenous contrast. CONTRAST:  33mL GADAVIST GADOBUTROL 1 MMOL/ML  IV SOLN COMPARISON:  None. FINDINGS: MRI THORACIC SPINE FINDINGS Alignment: Straightening of the normal thoracic kyphosis. No listhesis or subluxation. Vertebrae: Vertebral body height maintained without evidence for acute or chronic fracture. Bone marrow signal intensity within normal limits. No discrete or worrisome osseous lesions. No abnormal marrow edema or enhancement. Cord: Signal intensity within the thoracic spinal cord is normal. Normal cord caliber and morphology. No abnormal enhancement. No epidural collections. Paraspinal and other soft tissues: Paraspinous soft tissues within normal limits. Visualized lungs are grossly clear. Visualized visceral structures within normal limits. Disc levels: Multilevel degenerative spondylosis noted within the cervical spine on counter sequence without high-grade stenosis. T1-2:  Normal interspace.  Mild facet hypertrophy.  No stenosis. T2-3: Mild disc bulge.  No canal or foraminal stenosis. T3-4:  Unremarkable. T4-5:  Unremarkable. T5-6:  Unremarkable. T6-7:  Minimal disc bulge.  No canal or foraminal stenosis. T7-8:  Mild diffuse disc bulge.  No canal or foraminal stenosis. T8-9: Mild diffuse disc bulge. Superimposed right subarticular to foraminal disc protrusion (series 25, image 22). No significant spinal stenosis or cord deformity. Foramina remain patent. T9-10: Mild diffuse disc bulge.  Posterior element hypertrophy. No significant canal or foraminal stenosis. T10-11: Mild diffuse disc bulge. Left greater than right facet hypertrophy. No significant spinal stenosis. Moderate left foraminal narrowing. T11-12: Mild diffuse disc bulge with facet hypertrophy. No spinal stenosis. Mild-to-moderate left neural foraminal narrowing. T12-L1:  Unremarkable. MRI LUMBAR SPINE FINDINGS Segmentation: Standard. Lowest well-formed disc space labeled the L5-S1 level. Alignment: Straightening of the normal lumbar lordosis with underlying trace levoscoliosis. No listhesis. Vertebrae: Vertebral body height maintained without evidence for acute or chronic fracture. Bone marrow signal intensity within normal limits. No discrete or worrisome osseous lesions. Prominent discogenic reactive endplate changes present about the L3-4 and L4-5 interspaces. No other abnormal marrow edema or enhancement. No evidence for osteomyelitis discitis or septic arthritis. Conus medullaris: Extends to the L1-2 level and appears normal. Undulation of the nerve roots of the cauda equina related to spinal stenosis at L2-3 through L4-5 noted. No abnormal nerve root enhancement. No epidural collections. Paraspinal and other soft tissues: Paraspinous soft tissues within normal limits. Visualized visceral structures are normal. Disc levels: L1-2: Normal interspace. Mild left greater than right facet hypertrophy. No canal or foraminal stenosis. L2-3: Diffuse disc bulge with disc desiccation and intervertebral disc space narrowing. Mild facet and ligament flavum hypertrophy. Resultant moderate spinal stenosis. Foramina remain patent. L3-4: Advance degenerative intervertebral disc space narrowing with diffuse disc bulge and disc desiccation. Reactive endplate changes with marginal endplate osteophytic spurring. Mild to moderate facet hypertrophy. Trace joint effusion noted on the left. Epidural lipomatosis. Resultant moderate to severe canal with  bilateral subarticular stenosis. Moderate bilateral L3 foraminal narrowing. L4-5: Chronic intervertebral disc space narrowing with diffuse disc bulge and disc desiccation. Reactive endplate changes with marginal endplate osteophytic spurring. Moderate facet hypertrophy. Epidural lipomatosis. Resultant moderate to severe spinal stenosis, with moderate bilateral L4 foraminal narrowing, worse on the right. L5-S1: Mild disc desiccation without significant disc bulge. Moderate facet hypertrophy. Epidural lipomatosis. No canal or lateral recess stenosis. Foramina remain patent. IMPRESSION: MRI THORACIC SPINE IMPRESSION: 1. No acute abnormality within the thoracic spine with normal MRI appearance of the thoracic spinal cord. No findings to explain patient's symptoms identified. 2. Multilevel degenerative disc bulging throughout the mid and lower thoracic spine as above without significant spinal stenosis or cord impingement. Mild to moderate left foraminal narrowing at T10-11 and T11-12 related to disc bulge and facet hypertrophy. No other  significant foraminal encroachment. MRI LUMBAR SPINE IMPRESSION: 1. No acute abnormality within the lumbar spine. 2. Multifactorial degenerative changes at L2-3 through L4-5 with resultant moderate to severe diffuse spinal stenosis, with moderate bilateral L3 and L4 foraminal narrowing. 3. Undo lay shin of the nerve roots of the cauda equina secondary to underlying spinal stenosis. Finding could contribute to lower extremity symptoms. No abnormal nerve root enhancement or other acute abnormality. Electronically Signed   By: Jeannine Boga M.D.   On: 05/20/2019 23:14   CT ABDOMEN PELVIS W CONTRAST  Result Date: 05/21/2019 CLINICAL DATA:  Emesis EXAM: CT ABDOMEN AND PELVIS WITH CONTRAST TECHNIQUE: Multidetector CT imaging of the abdomen and pelvis was performed using the standard protocol following bolus administration of intravenous contrast. CONTRAST:  128mL OMNIPAQUE IOHEXOL  300 MG/ML  SOLN COMPARISON:  CT 04/10/2019 FINDINGS: Lower chest: Lung bases demonstrate no acute consolidation or effusion. Cardiac size within normal limits. Hepatobiliary: Calcified gallstones. No focal hepatic abnormality. No biliary dilatation. Possible mild wall thickening at the gastric fundus. Pancreas: Unremarkable. No pancreatic ductal dilatation or surrounding inflammatory changes. Spleen: Normal in size without focal abnormality. Adrenals/Urinary Tract: Adrenal glands are unremarkable. Kidneys are without hydronephrosis or focal lesion. Punctate stones within the bilateral kidneys. Bladder is unremarkable. Stomach/Bowel: The stomach is nonenlarged. No dilated small bowel. Negative appendix. Wall thickening and inflammatory change involving the sigmoid colon consistent with acute diverticulitis. No perforation. Vascular/Lymphatic: Moderate aortic atherosclerosis without aneurysm. Subcentimeter left iliac nodes. Reproductive: Prostate is unremarkable. Other: No free air. Small amount of fluid in the left lower quadrant Musculoskeletal: Advanced degenerative change at L3-L4 and L4-L5. IMPRESSION: 1. Findings consistent with acute sigmoid colon diverticulitis. No perforation or organized abscess. 2. Gallstones. Possible wall thickening at the gallbladder fundus, ultrasound correlation as indicated 3. Nonobstructing kidney stone Electronically Signed   By: Donavan Foil M.D.   On: 05/21/2019 03:13   DG Chest Port 1 View  Result Date: 05/20/2019 CLINICAL DATA:  Weakness EXAM: PORTABLE CHEST 1 VIEW COMPARISON:  07/06/2005 FINDINGS: Heart and mediastinal contours are within normal limits. No focal opacities or effusions. No acute bony abnormality. IMPRESSION: No active disease. Electronically Signed   By: Rolm Baptise M.D.   On: 05/20/2019 22:46   ECHOCARDIOGRAM COMPLETE  Result Date: 05/21/2019    ECHOCARDIOGRAM REPORT   Patient Name:   Raymond [name] Date of Exam: 05/21/2019 Medical Rec #:  [id number]        Height:       70.0 in Accession #:    [id number]      Weight:       290.0 lb Date of Birth:  [birth year]       BSA:          2.444 m Patient Age:    69 years        BP:           137/86 mmHg Patient Gender: M               HR:           87 bpm. Exam Location:  Inpatient Procedure: 2D Echo, Color Doppler and Cardiac Doppler Indications:    R55 Syncope  History:        Patient has prior history of Echocardiogram examinations, most                 recent 03/02/2015. Risk Factors:Hypertension.  Sonographer:    Raquel Sarna Senior RDCS Referring Phys: [place]  1. Left  ventricular ejection fraction, by estimation, is 60 to 65%. The left ventricle has normal function. The left ventricle has no regional wall motion abnormalities. There is moderate left ventricular hypertrophy. Left ventricular diastolic parameters are consistent with Grade I diastolic dysfunction (impaired relaxation).  2. Right ventricular systolic function is normal. The right ventricular size is normal.  3. The mitral valve is grossly normal. Trivial mitral valve regurgitation.  4. The aortic valve is tricuspid. Aortic valve regurgitation is not visualized.  5. Aortic dilatation noted. There is mild dilatation at the level of the sinuses of Valsalva measuring 41 mm.  6. The inferior vena cava is normal in size with greater than 50% respiratory variability, suggesting right atrial pressure of 3 mmHg. FINDINGS  Left Ventricle: Left ventricular ejection fraction, by estimation, is 60 to 65%. The left ventricle has normal function. The left ventricle has no regional wall motion abnormalities. The left ventricular internal cavity size was normal in size. There is  moderate left ventricular hypertrophy. Left ventricular diastolic parameters are consistent with Grade I diastolic dysfunction (impaired relaxation). Indeterminate filling pressures. Right Ventricle: The right ventricular size is normal. No increase in right ventricular wall  thickness. Right ventricular systolic function is normal. Left Atrium: Left atrial size was normal in size. Right Atrium: Right atrial size was normal in size. Pericardium: There is no evidence of pericardial effusion. Mitral Valve: The mitral valve is grossly normal. Trivial mitral valve regurgitation. Tricuspid Valve: The tricuspid valve is grossly normal. Tricuspid valve regurgitation is trivial. Aortic Valve: The aortic valve is tricuspid. Aortic valve regurgitation is not visualized. Pulmonic Valve: The pulmonic valve was normal in structure. Pulmonic valve regurgitation is not visualized. Aorta: Aortic dilatation noted. There is mild dilatation at the level of the sinuses of Valsalva measuring 41 mm. Venous: The inferior vena cava is normal in size with greater than 50% respiratory variability, suggesting right atrial pressure of 3 mmHg. IAS/Shunts: No atrial level shunt detected by color flow Doppler.  LEFT VENTRICLE PLAX 2D LVIDd:         5.17 cm  Diastology LVIDs:         3.46 cm  LV e' lateral:   9.14 cm/s LV PW:         1.53 cm  LV E/e' lateral: 6.5 LV IVS:        1.40 cm  LV e' medial:    7.29 cm/s LVOT diam:     2.60 cm  LV E/e' medial:  8.1 LV SV:         99 LV SV Index:   40 LVOT Area:     5.31 cm  RIGHT VENTRICLE RV S prime:     19.90 cm/s TAPSE (M-mode): 2.1 cm LEFT ATRIUM             Index       RIGHT ATRIUM           Index LA diam:        3.80 cm 1.55 cm/m  RA Area:     22.00 cm LA Vol (A2C):   53.4 ml 21.85 ml/m RA Volume:   71.70 ml  29.33 ml/m LA Vol (A4C):   70.3 ml 28.76 ml/m LA Biplane Vol: 61.3 ml 25.08 ml/m  AORTIC VALVE LVOT Vmax:   114.00 cm/s LVOT Vmean:  81.500 cm/s LVOT VTI:    0.186 m  AORTA Ao Root diam: 4.10 cm Ao Asc diam:  3.70 cm MITRAL VALVE MV Area (PHT): 2.32 cm  SHUNTS MV Decel Time: 327 msec    Systemic VTI:  0.19 m MV E velocity: 59.10 cm/s  Systemic Diam: 2.60 cm MV A velocity: 69.00 cm/s MV E/A ratio:  0.86 Lyman Bishop MD Electronically signed by Lyman Bishop MD Signature Date/Time: 05/21/2019/3:02:54 PM    Final     Procedures .Lumbar Puncture  Date/Time: 05/20/2019 10:18 PM Performed by: Margarita Mail, PA-C Authorized by: Margarita Mail, PA-C   Consent:    Consent obtained:  Written   Consent given by:  Patient   Risks discussed:  Bleeding, headache, infection, nerve damage and repeat procedure   Alternatives discussed:  No treatment Pre-procedure details:    Procedure purpose:  Diagnostic   Preparation: Patient was prepped and draped in usual sterile fashion   Procedure details:    Lumbar space:  L4-L5 interspace   Patient position:  Sitting   Needle gauge:  18   Needle type:  Spinal needle - Quincke tip   Needle length (in):  3.5   Ultrasound guidance: no     Number of attempts:  2   Fluid appearance:  Clear   Tubes of fluid:  4   Total volume (ml):  8 Post-procedure:    Puncture site:  Adhesive bandage applied   Patient tolerance of procedure:  Tolerated well, no immediate complications   (including critical care time)  Medications Ordered in ED Medications  HYDROcodone-acetaminophen (NORCO/VICODIN) 5-325 MG per tablet 1 tablet (1 tablet Oral Given 05/21/19 0008)  sodium chloride flush (NS) 0.9 % injection 3 mL (3 mLs Intravenous Given 05/21/19 1101)  acetaminophen (TYLENOL) tablet 650 mg (has no administration in time range)    Or  acetaminophen (TYLENOL) suppository 650 mg (has no administration in time range)  ondansetron (ZOFRAN) tablet 4 mg (has no administration in time range)    Or  ondansetron (ZOFRAN) injection 4 mg (has no administration in time range)  0.9 %  sodium chloride infusion ( Intravenous Paused 05/21/19 0606)  nicotine (NICODERM CQ - dosed in mg/24 hours) patch 21 mg (21 mg Transdermal Patch Removed 05/22/19 0959)  HYDROmorphone (DILAUDID) injection 0.5 mg (0.5 mg Intravenous Given 05/21/19 1533)  metroNIDAZOLE (FLAGYL) IVPB 500 mg (500 mg Intravenous New Bag/Given 05/21/19 1127)  cefTRIAXone  (ROCEPHIN) 2 g in sodium chloride 0.9 % 100 mL IVPB (2 g Intravenous New Bag/Given 05/21/19 1451)  enoxaparin (LOVENOX) injection 65 mg (65 mg Subcutaneous Given 05/21/19 1100)  senna-docusate (Senokot-S) tablet 1 tablet (has no administration in time range)  sodium chloride flush (NS) 0.9 % injection 3 mL (3 mLs Intravenous Given 05/21/19 1101)  acetaminophen (TYLENOL) tablet 650 mg (650 mg Oral Given 05/20/19 2044)  gadobutrol (GADAVIST) 1 MMOL/ML injection 10 mL (10 mLs Intravenous Contrast Given 05/20/19 2202)  iohexol (OMNIPAQUE) 300 MG/ML solution 125 mL (125 mLs Intravenous Contrast Given 05/21/19 0252)    ED Course  I have reviewed the triage vital signs and the nursing notes.  Pertinent labs & imaging results that were available during my care of the patient were reviewed by me and considered in my medical decision making ([name] chart for details).  Clinical Course as of May 20 1704  Mon May 20, 2019  1752 Case discussed with Dr. Rory Percy- recommends MRI- if negative, will need LP   [AH]    Clinical Course User Index [AH] Margarita Mail, PA-C   MDM Rules/Calculators/A&P  KA:[id number] VS: BP (!) 135/120 (BP Location: Right Arm)   Pulse 97   Temp 99.1 F (37.3 C)   Resp 18   Ht 5\' 10"  (1.778 m)   Wt 131.5 kg   SpO2 94%   BMI 41.61 kg/m  IO:MBTDHRC is gathered by patient  and emr. Previous records obtained and reviewed. DDX:The patient's complaint of weakness involves an extensive number of diagnostic and treatment options, and is a complaint that carries with it a high risk of complications, morbidity, and potential mortality. Given the large differential diagnosis, medical decision making is of high complexity. The differential diagnosis of weakness includes but is not limited to neurologic causes (GBS, myasthenia gravis, CVA, MS, ALS, transverse myelitis, spinal cord injury, CVA, botulism, ) and other causes: ACS, Arrhythmia, syncope, orthostatic hypotension,  sepsis, hypoglycemia, electrolyte disturbance, hypothyroidism, respiratory failure, symptomatic anemia, dehydration, heat injury, polypharmacy, malignancy.  Labs: I ordered reviewed and interpreted labs which include cbc with mildly elevated WBC count UA wnl, ethanol and UDS negative Imaging: I ordered and reviewed images which included CT head and MRI Brain.There are no acute, significant findings on today's images BUL:AGTXMI sinus rhythm at a rate of 85 - bnormal ecg Consults:Dr. Rory Percy, Dr. Cheral Marker, Dr. Roel Cluck WOE:HOZYYQM with BL leg weakness, MRI brain negative- LP results pending. Case reviewed and discussed with Dr. Cheral Marker wo will consult and Dr. Roel Cluck who will admit. Dr. Cheral Marker recomends MRI thoracic and L spine which I have ordered. Patient will be admitted. Patient disposition:admit The patient appears reasonably stabilized for admission considering the current resources, flow, and capabilities available in the ED at this time, and I doubt any other [facility] requiring further screening and/or treatment in the ED prior to admission.        Final Clinical Impression(s) / ED Diagnoses Final diagnoses:  Weakness of both lower extremities    Rx / DC Orders ED Discharge Orders    None       Margarita Mail, PA-C 05/21/19 1706    Dorie Rank, MD 05/22/19 1623

## 2019-05-21 ENCOUNTER — Encounter (HOSPITAL_COMMUNITY): Payer: Self-pay | Admitting: Internal Medicine

## 2019-05-21 ENCOUNTER — Inpatient Hospital Stay (HOSPITAL_COMMUNITY): Payer: Medicare Other

## 2019-05-21 DIAGNOSIS — Z8719 Personal history of other diseases of the digestive system: Secondary | ICD-10-CM

## 2019-05-21 DIAGNOSIS — R55 Syncope and collapse: Secondary | ICD-10-CM

## 2019-05-21 DIAGNOSIS — R404 Transient alteration of awareness: Secondary | ICD-10-CM

## 2019-05-21 LAB — COMPREHENSIVE METABOLIC PANEL
ALT: 22 U/L (ref 0–44)
AST: 18 U/L (ref 15–41)
Albumin: 3.4 g/dL — ABNORMAL LOW (ref 3.5–5.0)
Alkaline Phosphatase: 76 U/L (ref 38–126)
Anion gap: 10 (ref 5–15)
BUN: 16 mg/dL (ref 6–20)
CO2: 26 mmol/L (ref 22–32)
Calcium: 8.9 mg/dL (ref 8.9–10.3)
Chloride: 101 mmol/L (ref 98–111)
Creatinine, Ser: 1.2 mg/dL (ref 0.61–1.24)
GFR calc Af Amer: >60 mL/min (ref >60)
GFR calc non Af Amer: >60 mL/min (ref >60)
Glucose, Bld: 126 mg/dL — ABNORMAL HIGH (ref 70–99)
Potassium: 4.1 mmol/L (ref 3.5–5.1)
Sodium: 137 mmol/L (ref 135–145)
Total Bilirubin: 0.5 mg/dL (ref 0.3–1.2)
Total Protein: 6.6 g/dL (ref 6.5–8.1)

## 2019-05-21 LAB — CBC WITH DIFFERENTIAL/PLATELET
Abs Immature Granulocytes: 0.04 10*3/uL (ref 0.00–0.07)
Basophils Absolute: 0 10*3/uL (ref 0.0–0.1)
Basophils Relative: 0 %
Eosinophils Absolute: 0.1 10*3/uL (ref 0.0–0.5)
Eosinophils Relative: 1 %
HCT: 45.4 % (ref 39.0–52.0)
Hemoglobin: 14.5 g/dL (ref 13.0–17.0)
Immature Granulocytes: 0 %
Lymphocytes Relative: 12 %
Lymphs Abs: 1.6 10*3/uL (ref 0.7–4.0)
MCH: 28.5 pg (ref 26.0–34.0)
MCHC: 31.9 g/dL (ref 30.0–36.0)
MCV: 89.4 fL (ref 80.0–100.0)
Monocytes Absolute: 1.7 10*3/uL — ABNORMAL HIGH (ref 0.1–1.0)
Monocytes Relative: 13 %
Neutro Abs: 9.6 10*3/uL — ABNORMAL HIGH (ref 1.7–7.7)
Neutrophils Relative %: 74 %
Platelets: 207 10*3/uL (ref 150–400)
RBC: 5.08 MIL/uL (ref 4.22–5.81)
RDW: 12.8 % (ref 11.5–15.5)
WBC: 13.1 10*3/uL — ABNORMAL HIGH (ref 4.0–10.5)
nRBC: 0 % (ref 0.0–0.2)

## 2019-05-21 LAB — TROPONIN I (HIGH SENSITIVITY): Troponin I (High Sensitivity): 11 ng/L (ref <18)

## 2019-05-21 LAB — CK: Total CK: 244 U/L (ref 49–397)

## 2019-05-21 LAB — HIV ANTIBODY (ROUTINE TESTING W REFLEX): HIV Screen 4th Generation wRfx: NONREACTIVE

## 2019-05-21 LAB — ECHOCARDIOGRAM COMPLETE
Height: 70 in
Weight: 4640 oz

## 2019-05-21 LAB — TSH: TSH: 2.398 u[IU]/mL (ref 0.350–4.500)

## 2019-05-21 LAB — PHOSPHORUS: Phosphorus: 3.2 mg/dL (ref 2.5–4.6)

## 2019-05-21 LAB — MAGNESIUM: Magnesium: 1.7 mg/dL (ref 1.7–2.4)

## 2019-05-21 MED ORDER — ENOXAPARIN SODIUM 80 MG/0.8ML ~~LOC~~ SOLN
65.0000 mg | SUBCUTANEOUS | Status: DC
  Administered 2019-05-21 – 2019-05-25 (×5): 65 mg via SUBCUTANEOUS
  Filled 2019-05-21 (×5): qty 0.8

## 2019-05-21 MED ORDER — SENNOSIDES-DOCUSATE SODIUM 8.6-50 MG PO TABS
1.0000 | ORAL_TABLET | Freq: Two times a day (BID) | ORAL | Status: DC
  Administered 2019-05-21 (×2): 1 via ORAL
  Filled 2019-05-21 (×2): qty 1

## 2019-05-21 MED ORDER — CIPROFLOXACIN IN D5W 400 MG/200ML IV SOLN
400.0000 mg | Freq: Two times a day (BID) | INTRAVENOUS | Status: DC
  Administered 2019-05-21: 400 mg via INTRAVENOUS
  Filled 2019-05-21 (×2): qty 200

## 2019-05-21 MED ORDER — HYDROMORPHONE HCL 1 MG/ML IJ SOLN
0.5000 mg | INTRAMUSCULAR | Status: DC | PRN
  Administered 2019-05-21 – 2019-05-24 (×13): 0.5 mg via INTRAVENOUS
  Filled 2019-05-21 (×14): qty 1

## 2019-05-21 MED ORDER — SODIUM CHLORIDE 0.9 % IV SOLN
2.0000 g | INTRAVENOUS | Status: DC
  Administered 2019-05-21 – 2019-05-24 (×4): 2 g via INTRAVENOUS
  Filled 2019-05-21: qty 2
  Filled 2019-05-21: qty 20
  Filled 2019-05-21 (×3): qty 2

## 2019-05-21 MED ORDER — METRONIDAZOLE IN NACL 5-0.79 MG/ML-% IV SOLN
500.0000 mg | Freq: Three times a day (TID) | INTRAVENOUS | Status: DC
  Administered 2019-05-21 – 2019-05-25 (×13): 500 mg via INTRAVENOUS
  Filled 2019-05-21 (×13): qty 100

## 2019-05-21 MED ORDER — IOHEXOL 300 MG/ML  SOLN
125.0000 mL | Freq: Once | INTRAMUSCULAR | Status: AC | PRN
  Administered 2019-05-21: 125 mL via INTRAVENOUS

## 2019-05-21 NOTE — Progress Notes (Signed)
Pharmacy Antibiotic Note  Raymond [name] is a 59 y.o. male admitted on 05/20/2019 with intra abdominal infection.  Pharmacy has been consulted for cipro dosing.  Plan: Cipro 400 mg IV q12 F/u cultures and clinical course  Height: 5\' 10"  (177.8 cm) Weight: 131.5 kg (290 lb) IBW/kg (Calculated) : 73  Temp (24hrs), Avg:99.4 F (37.4 C), Min:99.4 F (37.4 C), Max:99.4 F (37.4 C)  Recent Labs  Lab 05/20/19 1324 05/20/19 1941  WBC 10.6*  --   CREATININE 1.19 1.00    Estimated Creatinine Clearance: 112.5 mL/min (by C-G formula based on SCr of 1 mg/dL).    No Known Allergies   Thank you for allowing pharmacy to be a part of this patient's care.  Excell Seltzer Poteet 05/21/2019 3:30 AM

## 2019-05-21 NOTE — Evaluation (Signed)
Occupational Therapy Evaluation Patient Details Name: Raymond [name] MRN: [id number] DOB: 19-Apr-1960 Today's Date: 05/21/2019    History of Present Illness Raymond [name] is an 59 y.o. male who presents to the ED today after an episode at work of transient alteration in awareness in conjunction with acute onset of bilateral lower extremity weakness that has persisted.  He arrived from work via EMS with a chief complaint of loss of balance and  coordination with difficulty ambulating   Clinical Impression   Pt presents with decline in function and safety with ADLs and ADL mobility with impaired strength, balance, coordination, endurance and is limited by abdominal pain. Pt lives at home with hs wife and reports that he was independent with ADLs/selfcare, IADLs, mobility and was working. Pt reports that although he was independent , he had difficulty with LB ADLs and sit - stand transitions from toilet. Pt currently requires min guard A with bed mobility to sit EOB, set up - sup with UB ADLs, mod - max A with LB ADLs, min guard A with toilet transfers and min A with clothing mgt. Pt would benefit from acute OT services to address impairments to maximize level of function and safety    Follow Up Recommendations  Home health OT    Equipment Recommendations  Toilet riser;Other (comment)(ADL A/E kit)    Recommendations for Other Services       Precautions / Restrictions Precautions Precautions: Fall Restrictions Weight Bearing Restrictions: No      Mobility Bed Mobility Overal bed mobility: Needs Assistance Bed Mobility: Supine to Sit;Sit to Supine     Supine to sit: Min guard Sit to supine: Min guard   General bed mobility comments: assist to pull up to seated position with therapist anchoring  Transfers Overall transfer level: Needs assistance Equipment used: 1 person hand held assist Transfers: Sit to/from Stand Sit to Stand: Min guard         General transfer comment:  Pt required two attempts to complete standing from EOB. Needed to power up with increased time and effort    Balance Overall balance assessment: Needs assistance Sitting-balance support: Bilateral upper extremity supported;Feet supported Sitting balance-Leahy Scale: Good     Standing balance support: Single extremity supported;During functional activity Standing balance-Leahy Scale: Poor                             ADL either performed or assessed with clinical judgement   ADL Overall ADL's : Needs assistance/impaired     Grooming: Wash/dry face;Wash/dry hands;Standing;Min guard   Upper Body Bathing: Set up;Supervision/ safety;Sitting   Lower Body Bathing: Moderate assistance;Sitting/lateral leans Lower Body Bathing Details (indicate cue type and reason): simulated, unable to reach passed ankles Upper Body Dressing : Set up;Supervision/safety;Sitting   Lower Body Dressing: Maximal assistance Lower Body Dressing Details (indicate cue type and reason): unable to don sock Toilet Transfer: Min guard;Ambulation;Regular Toilet;Grab bars   Toileting- Clothing Manipulation and Hygiene: Minimal assistance;Sit to/from stand       Functional mobility during ADLs: Min guard General ADL Comments: educated pt on LB ADL A/E for home use with handout provided     Vision Baseline Vision/History: Wears glasses Wears Glasses: Reading only Patient Visual Report: No change from baseline       Perception     Praxis      Pertinent Vitals/Pain Pain Assessment: 0-10 Pain Score: 9  Pain Location: Stomach Pain Descriptors / Indicators: Stabbing  Pain Intervention(s): Limited activity within patient's tolerance;Monitored during session;Repositioned     Hand Dominance Right   Extremity/Trunk Assessment Upper Extremity Assessment Upper Extremity Assessment: Generalized weakness   Lower Extremity Assessment Lower Extremity Assessment: Defer to PT evaluation        Communication Communication Communication: No difficulties   Cognition Arousal/Alertness: Awake/alert Behavior During Therapy: WFL for tasks assessed/performed Overall Cognitive Status: Within Functional Limits for tasks assessed                                     General Comments       Exercises     Shoulder Instructions      Home Living Family/patient expects to be discharged to:: Private residence Living Arrangements: Spouse/significant other Available Help at Discharge: Family;Available 24 hours/day Type of Home: Apartment Home Access: Stairs to enter Entrance Stairs-Number of Steps: 6 Entrance Stairs-Rails: Right;Left;Can reach both Home Layout: One level     Bathroom Shower/Tub: Corporate investment banker: Standard     Home Equipment: Shower seat;Grab bars - tub/shower          Prior Functioning/Environment Level of Independence: Independent    ADL's / Homemaking Assistance Needed: Pt reports that he was independent but has difficulty bathing a dressing LB PTA and with standing from toilet   Comments: Pt very independent.  Works as Games developer.  Has had gradual weakness over the past year in legs.        OT Problem List: Decreased strength;Impaired balance (sitting and/or standing);Pain;Decreased activity tolerance;Decreased coordination;Decreased knowledge of use of DME or AE      OT Treatment/Interventions: Self-care/ADL training;Therapeutic exercise;Balance training;Therapeutic activities;DME and/or AE instruction    OT Goals(Current goals can be found in the care plan section) Acute Rehab OT Goals Patient Stated Goal: "find out why stomach hurts" OT Goal Formulation: With patient Time For Goal Achievement: 06/04/19 Potential to Achieve Goals: Good ADL Goals Pt Will Perform Grooming: with supervision;with set-up;with modified independence;standing Pt Will Perform Upper Body Bathing: with set-up;with modified  independence;sitting Pt Will Perform Lower Body Bathing: with min assist;with adaptive equipment;sitting/lateral leans;sit to/from stand Pt Will Perform Upper Body Dressing: with set-up;with modified independence;sitting Pt Will Perform Lower Body Dressing: with mod assist;sitting/lateral leans;sit to/from stand;with adaptive equipment Pt Will Transfer to Toilet: with supervision;ambulating Pt Will Perform Toileting - Clothing Manipulation and hygiene: with min guard assist;with supervision;sit to/from stand  OT Frequency: Min 2X/week   Barriers to D/C:            Co-evaluation PT/OT/SLP Co-Evaluation/Treatment: Yes Reason for Co-Treatment: For patient/therapist safety;To address functional/ADL transfers   OT goals addressed during session: ADL's and self-care;Proper use of Adaptive equipment and DME      AM-PAC OT "6 Clicks" Daily Activity     Outcome Measure Help from another person eating meals?: None Help from another person taking care of personal grooming?: A Little Help from another person toileting, which includes using toliet, bedpan, or urinal?: A Little Help from another person bathing (including washing, rinsing, drying)?: A Lot Help from another person to put on and taking off regular upper body clothing?: None Help from another person to put on and taking off regular lower body clothing?: A Lot 6 Click Score: 18   End of Session    Activity Tolerance: Patient limited by pain Patient left: in bed;with call bell/phone within reach  OT Visit Diagnosis: Unsteadiness on feet (  R26.81);Other abnormalities of gait and mobility (R26.89);Muscle weakness (generalized) (M62.81);Pain Pain - part of body: (abdomen)                Time: 4656-8127 OT Time Calculation (min): 21 min Charges:  OT General Charges $OT Visit: 1 Visit OT Treatments $Self Care/Home Management : 8-22 mins    Britt Bottom 05/21/2019, 1:47 PM

## 2019-05-21 NOTE — Progress Notes (Addendum)
PROGRESS NOTE    PARMVIR BOOMER  VVO:[id number] DOB: [birth year] DOA: 05/20/2019 PCP: Benito Mccreedy, MD  Brief Narrative: Raymond [name] is a 59 y.o. male with medical history significant of HTN , diverticulitis in 2018, presented to the emergency room with worsening balance, difficulties with ambulation that started 3 days prior to admission.  Patient reports chronic low back pain and difficulty initiating movements, getting out of bed, getting up from a seated position for several months.  His coworkers started noticing difficulties with walking in the last few days, day prior to admission he was not able to walk while at work and hence sent to the emergency room. -In the ED he was seen by neurology, had MRI brain and MRI LS spine, which noted severe spinal stenosis and undulation of the nerve roots of the cauda equina. -In addition also noted worsening left lower quadrant abdominal pain for the last several days, similar to prior episode of diverticulitis in 2018  Assessment & Plan:   Severe spinal stenosis Progressive gait disorder -MRI LS-spine noted severe spinal stenosis and undulation of nerve roots of cauda equina secondary to severe spinal stenosis -Appreciate neurology input -Will request neurosurgery consultation -PT OT  Acute diverticulitis -Worsening abdominal symptoms and left lower quadrant pain history of intermittent flares since his previous episode of diverticulitis in 2018 -Start clear liquids, stop ciprofloxacin, start IV ceftriaxone and Flagyl -History of colonoscopy 5 to 6 years ago  Obesity -BMI is 41.6 -Needs lifestyle modification  Tobacco abuse -Counseled, nicotine patch  Hyperglycemia -Check hemoglobin A1c  Possible syncopal event few days ago Versus fall -EKG with T wave inversion in lateral leads, some of this was noted in 2017 as well, monitor on telemetry and check 2D echo  DVT prophylaxis: Add Lovenox Code Status: Full code Family  Communication: Discussed with patient in detail, no family at bedside Disposition Plan:  Status is: Inpatient  Remains inpatient appropriate because:Ongoing diagnostic testing needed not appropriate for outpatient work up   Dispo: The patient is from: Home              Anticipated d/c is to: Home              Anticipated d/c date is: > 3 days              Patient currently is not medically stable to d/c.  Consultants:   Neurosurgery Dr. Kathyrn Sheriff   Procedures:   Antimicrobials:    Subjective: -Complains of left lower quadrant abdominal pain -Ongoing lower extremity weakness for months, worse in the last few days  Objective: Vitals:   05/21/19 0445 05/21/19 0530 05/21/19 0623 05/21/19 0625  BP: (!) 169/98 (!) 146/88 137/86 137/86  Pulse: 88 84 82 84  Resp: 16 16 15    Temp:  98.9 F (37.2 C) 99.8 F (37.7 C) 99.8 F (37.7 C)  TempSrc:  Oral Oral   SpO2: 95% 95% 91% 90%  Weight:      Height:        Intake/Output Summary (Last 24 hours) at 05/21/2019 1003 Last data filed at 05/21/2019 0606 Gross per 24 hour  Intake 750.83 ml  Output --  Net 750.83 ml   Filed Weights   05/20/19 1319  Weight: 131.5 kg    Examination:  General exam: Obese pleasant male laying in bed, AAOx3, no distress Respiratory system: Clear Cardiovascular system: S1 & S2 heard, RRR.  Gastrointestinal system: Soft, obese, mild left lower quadrant tenderness, bowel sounds present, no  rigidity or rebound Central nervous system: Awake and alert, cranial nerves intact, motor strength is 4 out of 5 in lower extremities, rest normal, sensations intact, diminished reflexes in lower extremity and plantars are mute Extremities: No edema Skin: No rashes on exposed skin  psychiatry: Judgement and insight appear normal. Mood & affect appropriate.     Data Reviewed:   CBC: Recent Labs  Lab 05/20/19 1324 05/20/19 1941 05/21/19 0538  WBC 10.6*  --  13.1*  NEUTROABS 7.6  --  9.6*  HGB 15.0 15.3  14.5  HCT 47.0 45.0 45.4  MCV 89.0  --  89.4  PLT 236  --  740   Basic Metabolic Panel: Recent Labs  Lab 05/20/19 1324 05/20/19 1941 05/21/19 0538  NA 139 137 137  K 3.7 3.8 4.1  CL 102 102 101  CO2 27  --  26  GLUCOSE 115* 98 126*  BUN 10 13 16   CREATININE 1.19 1.00 1.20  CALCIUM 9.6  --  8.9  MG  --   --  1.7  PHOS  --   --  3.2   GFR: Estimated Creatinine Clearance: 93.7 mL/min (by C-G formula based on SCr of 1.2 mg/dL). Liver Function Tests: Recent Labs  Lab 05/20/19 1324 05/21/19 0538  AST 27 18  ALT 23 22  ALKPHOS 87 76  BILITOT 0.5 0.5  PROT 7.2 6.6  ALBUMIN 3.9 3.4*   No results for input(s): LIPASE, AMYLASE in the last 168 hours. No results for input(s): AMMONIA in the last 168 hours. Coagulation Profile: Recent Labs  Lab 05/20/19 1324  INR 1.0   Cardiac Enzymes: Recent Labs  Lab 05/21/19 0538  CKTOTAL 244   BNP (last 3 results) No results for input(s): PROBNP in the last 8760 hours. HbA1C: No results for input(s): HGBA1C in the last 72 hours. CBG: No results for input(s): GLUCAP in the last 168 hours. Lipid Profile: No results for input(s): CHOL, HDL, LDLCALC, TRIG, CHOLHDL, LDLDIRECT in the last 72 hours. Thyroid Function Tests: Recent Labs    05/21/19 0538  TSH 2.398   Anemia Panel: No results for input(s): VITAMINB12, FOLATE, FERRITIN, TIBC, IRON, RETICCTPCT in the last 72 hours. Urine analysis:    Component Value Date/Time   COLORURINE YELLOW 05/20/2019 1659   APPEARANCEUR CLEAR 05/20/2019 1659   LABSPEC 1.016 05/20/2019 1659   PHURINE 6.0 05/20/2019 1659   GLUCOSEU NEGATIVE 05/20/2019 1659   HGBUR NEGATIVE 05/20/2019 1659   HGBUR moderate 10/17/2007 1442   BILIRUBINUR NEGATIVE 05/20/2019 [place] 05/20/2019 1659   PROTEINUR NEGATIVE 05/20/2019 1659   UROBILINOGEN 1.0 07/17/2013 1048   NITRITE NEGATIVE 05/20/2019 [place] 05/20/2019 1659   Sepsis  Labs: @LABRCNTIP (procalcitonin:4,lacticidven:4)  ) Recent Results (from the past 240 hour(s))  Gram stain     Status: None   Collection Time: 05/20/19  7:53 PM   Specimen: CSF; Cerebrospinal Fluid  Result Value Ref Range Status   Specimen Description CSF  Final   Special Requests NONE  Final   Gram Stain   Final    WBC PRESENT, PREDOMINANTLY MONONUCLEAR NO ORGANISMS SEEN CYTOSPIN SMEAR Performed at [facility], [place] [address] [place], [place] [address]    Report Status 05/20/2019 FINAL  Final  SARS Coronavirus 2 by RT PCR (hospital order, performed in [facility] hospital lab) Nasopharyngeal Nasopharyngeal Swab     Status: None   Collection Time: 05/20/19  8:51 PM   Specimen: Nasopharyngeal Swab  Result  Value Ref Range Status   SARS Coronavirus 2 NEGATIVE NEGATIVE Final    Comment: (NOTE) SARS-CoV-2 target nucleic acids are NOT DETECTED. The SARS-CoV-2 RNA is generally detectable in upper and lower respiratory specimens during the acute phase of infection. The lowest concentration of SARS-CoV-2 viral copies this assay can detect is 250 copies / mL. A negative result does not preclude SARS-CoV-2 infection and should not be used as the sole basis for treatment or other patient management decisions.  A negative result may occur with improper specimen collection / handling, submission of specimen other than nasopharyngeal swab, presence of viral mutation(s) within the areas targeted by this assay, and inadequate number of viral copies (<250 copies / mL). A negative result must be combined with clinical observations, patient history, and epidemiological information. Fact Sheet for Patients:   StrictlyIdeas.no Fact Sheet for Healthcare Providers: BankingDealers.co.za This test is not yet approved or cleared  by the [place] FDA and has been authorized for detection and/or diagnosis of SARS-CoV-2 by FDA under an Emergency  Use Authorization (EUA).  This EUA will remain in effect (meaning this test can be used) for the duration of the COVID-19 declaration under Section 564(b)(1) of the Act, 21 U.S.C. section 360bbb-3(b)(1), unless the authorization is terminated or revoked sooner. Performed at [facility], [place] [address], [place], [place] [address]          Radiology Studies: EEG  Result Date: 05/21/2019 Lora Havens, MD     05/21/2019  9:39 AM Patient Name: MARSALIS BEAULIEU MRN: [id number] Epilepsy Attending: Lora Havens Referring Physician/Provider: Dr Leonie Man Date: 05/21/2019 Duration: 29.26 mins Patient history: 59yo M with transient spell of altered awareness. EEG to evaluate for seizure Level of alertness: Awake, asleep AEDs during EEG study: None Technical aspects: This EEG study was done with scalp electrodes positioned according to the 10-20 International system of electrode placement. Electrical activity was acquired at a sampling rate of 500Hz  and reviewed with a high frequency filter of 70Hz  and a low frequency filter of 1Hz . EEG data were recorded continuously and digitally stored. Description: The posterior dominant rhythm consists of 9-10 Hz activity of moderate voltage (25-35 uV) seen predominantly in posterior head regions, symmetric and reactive to eye opening and eye closing. Sleep was characterized by vertex waves, sleep spindles (12 to 14 Hz), maximal frontocentral region.  Hyperventilation and photic stimulation were not performed.   IMPRESSION: This study is within normal limits. No seizures or epileptiform discharges were seen throughout the recording. Lora Havens   CT HEAD WO CONTRAST  Result Date: 05/20/2019 CLINICAL DATA:  Ataxia, stroke suspected, loss of balance EXAM: CT HEAD WITHOUT CONTRAST TECHNIQUE: Contiguous axial images were obtained from the base of the skull through the vertex without intravenous contrast. COMPARISON:  None. FINDINGS: Brain: No  evidence of acute infarction, hemorrhage, hydrocephalus, extra-axial collection or mass lesion/mass effect. Vascular: Atherosclerotic calcification of the carotid siphons. No hyperdense vessel. Skull: No calvarial fracture or suspicious osseous lesion. No scalp swelling or hematoma. Sinuses/Orbits: Paranasal sinuses and mastoid air cells are predominantly clear. Included orbital structures are unremarkable. Other: None IMPRESSION: No acute intracranial findings. If there is persisting clinical concern for infarct, MRI is more sensitive and specific for early changes of ischemia. Electronically Signed   By: Lovena Le M.D.   On: 05/20/2019 17:59   MR BRAIN WO CONTRAST  Result Date: 05/20/2019 CLINICAL DATA:  59 year old male with ataxia, loss of balance. EXAM: MRI HEAD WITHOUT  CONTRAST TECHNIQUE: Multiplanar, multiecho pulse sequences of the brain and surrounding structures were obtained without intravenous contrast. COMPARISON:  Head CT 1748 hours today. FINDINGS: Brain: No restricted diffusion to suggest acute infarction. No midline shift, mass effect, evidence of mass lesion, ventriculomegaly, extra-axial collection or acute intracranial hemorrhage. Cervicomedullary junction and pituitary are within normal limits. Pearline Cables and white matter signal is within normal limits for age throughout the brain. No cortical encephalomalacia or chronic cerebral blood products identified. Vascular: Major intracranial vascular flow voids are preserved. Skull and upper cervical spine: Negative visible cervical spine, bone marrow signal. Sinuses/Orbits: Mildly Disconjugate gaze but otherwise negative orbits. Paranasal sinuses and mastoids are stable and well pneumatized. Other: Grossly normal visible internal auditory structures. Stylomastoid foramina appear normal. Scalp and face soft tissues appear negative. IMPRESSION: No acute intracranial abnormality. Normal for age noncontrast MRI appearance of the brain. No explanation for  ataxia. Electronically Signed   By: Genevie Ann M.D.   On: 05/20/2019 18:36   MR THORACIC SPINE W WO CONTRAST  Result Date: 05/20/2019 CLINICAL DATA:  Initial evaluation for bilateral lower extremity weakness in a reflexia. EXAM: MRI THORACIC AND LUMBAR SPINE WITHOUT AND WITH CONTRAST TECHNIQUE: Multiplanar and multiecho pulse sequences of the thoracic and lumbar spine were obtained without and with intravenous contrast. CONTRAST:  38mL GADAVIST GADOBUTROL 1 MMOL/ML IV SOLN COMPARISON:  None. FINDINGS: MRI THORACIC SPINE FINDINGS Alignment: Straightening of the normal thoracic kyphosis. No listhesis or subluxation. Vertebrae: Vertebral body height maintained without evidence for acute or chronic fracture. Bone marrow signal intensity within normal limits. No discrete or worrisome osseous lesions. No abnormal marrow edema or enhancement. Cord: Signal intensity within the thoracic spinal cord is normal. Normal cord caliber and morphology. No abnormal enhancement. No epidural collections. Paraspinal and other soft tissues: Paraspinous soft tissues within normal limits. Visualized lungs are grossly clear. Visualized visceral structures within normal limits. Disc levels: Multilevel degenerative spondylosis noted within the cervical spine on counter sequence without high-grade stenosis. T1-2:  Normal interspace.  Mild facet hypertrophy.  No stenosis. T2-3: Mild disc bulge.  No canal or foraminal stenosis. T3-4:  Unremarkable. T4-5:  Unremarkable. T5-6:  Unremarkable. T6-7:  Minimal disc bulge.  No canal or foraminal stenosis. T7-8:  Mild diffuse disc bulge.  No canal or foraminal stenosis. T8-9: Mild diffuse disc bulge. Superimposed right subarticular to foraminal disc protrusion (series 25, image 22). No significant spinal stenosis or cord deformity. Foramina remain patent. T9-10: Mild diffuse disc bulge. Posterior element hypertrophy. No significant canal or foraminal stenosis. T10-11: Mild diffuse disc bulge. Left  greater than right facet hypertrophy. No significant spinal stenosis. Moderate left foraminal narrowing. T11-12: Mild diffuse disc bulge with facet hypertrophy. No spinal stenosis. Mild-to-moderate left neural foraminal narrowing. T12-L1:  Unremarkable. MRI LUMBAR SPINE FINDINGS Segmentation: Standard. Lowest well-formed disc space labeled the L5-S1 level. Alignment: Straightening of the normal lumbar lordosis with underlying trace levoscoliosis. No listhesis. Vertebrae: Vertebral body height maintained without evidence for acute or chronic fracture. Bone marrow signal intensity within normal limits. No discrete or worrisome osseous lesions. Prominent discogenic reactive endplate changes present about the L3-4 and L4-5 interspaces. No other abnormal marrow edema or enhancement. No evidence for osteomyelitis discitis or septic arthritis. Conus medullaris: Extends to the L1-2 level and appears normal. Undulation of the nerve roots of the cauda equina related to spinal stenosis at L2-3 through L4-5 noted. No abnormal nerve root enhancement. No epidural collections. Paraspinal and other soft tissues: Paraspinous soft tissues within normal limits. Visualized visceral  structures are normal. Disc levels: L1-2: Normal interspace. Mild left greater than right facet hypertrophy. No canal or foraminal stenosis. L2-3: Diffuse disc bulge with disc desiccation and intervertebral disc space narrowing. Mild facet and ligament flavum hypertrophy. Resultant moderate spinal stenosis. Foramina remain patent. L3-4: Advance degenerative intervertebral disc space narrowing with diffuse disc bulge and disc desiccation. Reactive endplate changes with marginal endplate osteophytic spurring. Mild to moderate facet hypertrophy. Trace joint effusion noted on the left. Epidural lipomatosis. Resultant moderate to severe canal with bilateral subarticular stenosis. Moderate bilateral L3 foraminal narrowing. L4-5: Chronic intervertebral disc space  narrowing with diffuse disc bulge and disc desiccation. Reactive endplate changes with marginal endplate osteophytic spurring. Moderate facet hypertrophy. Epidural lipomatosis. Resultant moderate to severe spinal stenosis, with moderate bilateral L4 foraminal narrowing, worse on the right. L5-S1: Mild disc desiccation without significant disc bulge. Moderate facet hypertrophy. Epidural lipomatosis. No canal or lateral recess stenosis. Foramina remain patent. IMPRESSION: MRI THORACIC SPINE IMPRESSION: 1. No acute abnormality within the thoracic spine with normal MRI appearance of the thoracic spinal cord. No findings to explain patient's symptoms identified. 2. Multilevel degenerative disc bulging throughout the mid and lower thoracic spine as above without significant spinal stenosis or cord impingement. Mild to moderate left foraminal narrowing at T10-11 and T11-12 related to disc bulge and facet hypertrophy. No other significant foraminal encroachment. MRI LUMBAR SPINE IMPRESSION: 1. No acute abnormality within the lumbar spine. 2. Multifactorial degenerative changes at L2-3 through L4-5 with resultant moderate to severe diffuse spinal stenosis, with moderate bilateral L3 and L4 foraminal narrowing. 3. Undo lay shin of the nerve roots of the cauda equina secondary to underlying spinal stenosis. Finding could contribute to lower extremity symptoms. No abnormal nerve root enhancement or other acute abnormality. Electronically Signed   By: Jeannine Boga M.D.   On: 05/20/2019 23:14   MR Lumbar Spine W Wo Contrast  Result Date: 05/20/2019 CLINICAL DATA:  Initial evaluation for bilateral lower extremity weakness in a reflexia. EXAM: MRI THORACIC AND LUMBAR SPINE WITHOUT AND WITH CONTRAST TECHNIQUE: Multiplanar and multiecho pulse sequences of the thoracic and lumbar spine were obtained without and with intravenous contrast. CONTRAST:  28mL GADAVIST GADOBUTROL 1 MMOL/ML IV SOLN COMPARISON:  None. FINDINGS: MRI  THORACIC SPINE FINDINGS Alignment: Straightening of the normal thoracic kyphosis. No listhesis or subluxation. Vertebrae: Vertebral body height maintained without evidence for acute or chronic fracture. Bone marrow signal intensity within normal limits. No discrete or worrisome osseous lesions. No abnormal marrow edema or enhancement. Cord: Signal intensity within the thoracic spinal cord is normal. Normal cord caliber and morphology. No abnormal enhancement. No epidural collections. Paraspinal and other soft tissues: Paraspinous soft tissues within normal limits. Visualized lungs are grossly clear. Visualized visceral structures within normal limits. Disc levels: Multilevel degenerative spondylosis noted within the cervical spine on counter sequence without high-grade stenosis. T1-2:  Normal interspace.  Mild facet hypertrophy.  No stenosis. T2-3: Mild disc bulge.  No canal or foraminal stenosis. T3-4:  Unremarkable. T4-5:  Unremarkable. T5-6:  Unremarkable. T6-7:  Minimal disc bulge.  No canal or foraminal stenosis. T7-8:  Mild diffuse disc bulge.  No canal or foraminal stenosis. T8-9: Mild diffuse disc bulge. Superimposed right subarticular to foraminal disc protrusion (series 25, image 22). No significant spinal stenosis or cord deformity. Foramina remain patent. T9-10: Mild diffuse disc bulge. Posterior element hypertrophy. No significant canal or foraminal stenosis. T10-11: Mild diffuse disc bulge. Left greater than right facet hypertrophy. No significant spinal stenosis. Moderate left foraminal narrowing.  T11-12: Mild diffuse disc bulge with facet hypertrophy. No spinal stenosis. Mild-to-moderate left neural foraminal narrowing. T12-L1:  Unremarkable. MRI LUMBAR SPINE FINDINGS Segmentation: Standard. Lowest well-formed disc space labeled the L5-S1 level. Alignment: Straightening of the normal lumbar lordosis with underlying trace levoscoliosis. No listhesis. Vertebrae: Vertebral body height maintained without  evidence for acute or chronic fracture. Bone marrow signal intensity within normal limits. No discrete or worrisome osseous lesions. Prominent discogenic reactive endplate changes present about the L3-4 and L4-5 interspaces. No other abnormal marrow edema or enhancement. No evidence for osteomyelitis discitis or septic arthritis. Conus medullaris: Extends to the L1-2 level and appears normal. Undulation of the nerve roots of the cauda equina related to spinal stenosis at L2-3 through L4-5 noted. No abnormal nerve root enhancement. No epidural collections. Paraspinal and other soft tissues: Paraspinous soft tissues within normal limits. Visualized visceral structures are normal. Disc levels: L1-2: Normal interspace. Mild left greater than right facet hypertrophy. No canal or foraminal stenosis. L2-3: Diffuse disc bulge with disc desiccation and intervertebral disc space narrowing. Mild facet and ligament flavum hypertrophy. Resultant moderate spinal stenosis. Foramina remain patent. L3-4: Advance degenerative intervertebral disc space narrowing with diffuse disc bulge and disc desiccation. Reactive endplate changes with marginal endplate osteophytic spurring. Mild to moderate facet hypertrophy. Trace joint effusion noted on the left. Epidural lipomatosis. Resultant moderate to severe canal with bilateral subarticular stenosis. Moderate bilateral L3 foraminal narrowing. L4-5: Chronic intervertebral disc space narrowing with diffuse disc bulge and disc desiccation. Reactive endplate changes with marginal endplate osteophytic spurring. Moderate facet hypertrophy. Epidural lipomatosis. Resultant moderate to severe spinal stenosis, with moderate bilateral L4 foraminal narrowing, worse on the right. L5-S1: Mild disc desiccation without significant disc bulge. Moderate facet hypertrophy. Epidural lipomatosis. No canal or lateral recess stenosis. Foramina remain patent. IMPRESSION: MRI THORACIC SPINE IMPRESSION: 1. No acute  abnormality within the thoracic spine with normal MRI appearance of the thoracic spinal cord. No findings to explain patient's symptoms identified. 2. Multilevel degenerative disc bulging throughout the mid and lower thoracic spine as above without significant spinal stenosis or cord impingement. Mild to moderate left foraminal narrowing at T10-11 and T11-12 related to disc bulge and facet hypertrophy. No other significant foraminal encroachment. MRI LUMBAR SPINE IMPRESSION: 1. No acute abnormality within the lumbar spine. 2. Multifactorial degenerative changes at L2-3 through L4-5 with resultant moderate to severe diffuse spinal stenosis, with moderate bilateral L3 and L4 foraminal narrowing. 3. Undo lay shin of the nerve roots of the cauda equina secondary to underlying spinal stenosis. Finding could contribute to lower extremity symptoms. No abnormal nerve root enhancement or other acute abnormality. Electronically Signed   By: Jeannine Boga M.D.   On: 05/20/2019 23:14   CT ABDOMEN PELVIS W CONTRAST  Result Date: 05/21/2019 CLINICAL DATA:  Emesis EXAM: CT ABDOMEN AND PELVIS WITH CONTRAST TECHNIQUE: Multidetector CT imaging of the abdomen and pelvis was performed using the standard protocol following bolus administration of intravenous contrast. CONTRAST:  130mL OMNIPAQUE IOHEXOL 300 MG/ML  SOLN COMPARISON:  CT 04/10/2019 FINDINGS: Lower chest: Lung bases demonstrate no acute consolidation or effusion. Cardiac size within normal limits. Hepatobiliary: Calcified gallstones. No focal hepatic abnormality. No biliary dilatation. Possible mild wall thickening at the gastric fundus. Pancreas: Unremarkable. No pancreatic ductal dilatation or surrounding inflammatory changes. Spleen: Normal in size without focal abnormality. Adrenals/Urinary Tract: Adrenal glands are unremarkable. Kidneys are without hydronephrosis or focal lesion. Punctate stones within the bilateral kidneys. Bladder is unremarkable.  Stomach/Bowel: The stomach is nonenlarged. No dilated  small bowel. Negative appendix. Wall thickening and inflammatory change involving the sigmoid colon consistent with acute diverticulitis. No perforation. Vascular/Lymphatic: Moderate aortic atherosclerosis without aneurysm. Subcentimeter left iliac nodes. Reproductive: Prostate is unremarkable. Other: No free air. Small amount of fluid in the left lower quadrant Musculoskeletal: Advanced degenerative change at L3-L4 and L4-L5. IMPRESSION: 1. Findings consistent with acute sigmoid colon diverticulitis. No perforation or organized abscess. 2. Gallstones. Possible wall thickening at the gallbladder fundus, ultrasound correlation as indicated 3. Nonobstructing kidney stone Electronically Signed   By: Donavan Foil M.D.   On: 05/21/2019 03:13   DG Chest Port 1 View  Result Date: 05/20/2019 CLINICAL DATA:  Weakness EXAM: PORTABLE CHEST 1 VIEW COMPARISON:  07/06/2005 FINDINGS: Heart and mediastinal contours are within normal limits. No focal opacities or effusions. No acute bony abnormality. IMPRESSION: No active disease. Electronically Signed   By: Rolm Baptise M.D.   On: 05/20/2019 22:46        Scheduled Meds: . docusate sodium  100 mg Oral BID  . nicotine  21 mg Transdermal Daily  . sodium chloride flush  3 mL Intravenous Once  . sodium chloride flush  3 mL Intravenous Q12H   Continuous Infusions: . ciprofloxacin Stopped (05/21/19 0507)  . metronidazole Stopped (05/21/19 0507)     LOS: 1 day    Time spent: 42min  Domenic Polite, MD [facility]  05/21/2019, 10:03 AM

## 2019-05-21 NOTE — Procedures (Addendum)
Patient Name: Raymond [name]  MRN: [id number]  Epilepsy Attending: Lora Havens  Referring Physician/Provider: Dr Leonie Man Date: 05/21/2019 Duration: 29.26 mins  Patient history: 59yo M with transient spell of altered awareness. EEG to evaluate for seizure  Level of alertness: Awake, asleep  AEDs during EEG study: None  Technical aspects: This EEG study was done with scalp electrodes positioned according to the 10-20 International system of electrode placement. Electrical activity was acquired at a sampling rate of 500Hz  and reviewed with a high frequency filter of 70Hz  and a low frequency filter of 1Hz . EEG data were recorded continuously and digitally stored.   Description: The posterior dominant rhythm consists of 9-10 Hz activity of moderate voltage (25-35 uV) seen predominantly in posterior head regions, symmetric and reactive to eye opening and eye closing. Sleep was characterized by vertex waves, sleep spindles (12 to 14 Hz), maximal frontocentral region.  Hyperventilation and photic stimulation were not performed.     IMPRESSION: This study is within normal limits. No seizures or epileptiform discharges were seen throughout the recording.  [name]

## 2019-05-21 NOTE — Plan of Care (Addendum)
EEG within normal limits-brain MRI with no acute changes, no need for antiepileptics.  Appreciate neurosurgical consultation and further management of spinal stenosis per neurosurgery.  Please call inpatient neurology with questions.  -- Amie Portland, MD [facility] Pager: [phone] If 7pm to 7am, please call on call as listed on AMION.

## 2019-05-21 NOTE — Evaluation (Signed)
Physical Therapy Evaluation Patient Details Name: Raymond [name] MRN: [id number] DOB: 10/04/1960 Today's Date: 05/21/2019   History of Present Illness  Raymond [name] is an 59 y.o. male who presents to the ED today after an episode at work of transient alteration in awareness in conjunction with acute onset of bilateral lower extremity weakness that has persisted.  He arrived from work via EMS with a chief complaint of loss of balance and  coordination with difficulty ambulating.  MRI of head and spine were negative for acute changes.  Lumbar Spine MRI did show stenosis and per neurosurgery may benefit from non-emergent decompression but ok to participate with PT/OT.  Additionally, pt has had lumbar puncture.  Pt developed severe abdominal pain that was similar to flairs of diverticulitiis and is having that worked up.  Clinical Impression  Pt admitted with above diagnosis. Pt was mainly limited due to "stabbing" abdominal pain that is persistant.  Pt did have weakness in lower extremities that has been worsening over the past year.  Pt was able to complete transfers with min guard to min A, but considering age and physical condition -pt having more difficulty than he should.  Pt currently with functional limitations due to the deficits listed below (see PT Problem List). Pt will benefit from skilled PT to increase their independence and safety with mobility to allow discharge to the venue listed below.       Follow Up Recommendations Outpatient PT    Equipment Recommendations  None recommended by PT    Recommendations for Other Services       Precautions / Restrictions Precautions Precautions: Fall Restrictions Weight Bearing Restrictions: No      Mobility  Bed Mobility Overal bed mobility: Needs Assistance Bed Mobility: Supine to Sit;Sit to Supine     Supine to sit: Min guard;HOB elevated Sit to supine: Min guard;HOB elevated   General bed mobility comments: assist to pull up  to seated position with therapist anchoring; increased time and effort from pt  Transfers Overall transfer level: Needs assistance Equipment used: None Transfers: Sit to/from Stand Sit to Stand: Min guard         General transfer comment: Pt required two attempts to complete standing from EOB. Needed to power up with increased time and effort  Ambulation/Gait Ambulation/Gait assistance: Min guard Gait Distance (Feet): 20 Feet(x2) Assistive device: None Gait Pattern/deviations: Wide base of support;Decreased stride length;Trunk flexed Gait velocity: decreased   General Gait Details: limited due to abdominal pain  Stairs            Wheelchair Mobility    Modified Rankin (Stroke Patients Only)       Balance Overall balance assessment: Needs assistance Sitting-balance support: Feet supported;No upper extremity supported Sitting balance-Leahy Scale: Good     Standing balance support: During functional activity;No upper extremity supported Standing balance-Leahy Scale: Fair                               Pertinent Vitals/Pain Pain Assessment: 0-10 Pain Score: 9  Pain Location: Stomach Pain Descriptors / Indicators: Stabbing Pain Intervention(s): Limited activity within patient's tolerance;Monitored during session;Repositioned    Home Living Family/patient expects to be discharged to:: Private residence Living Arrangements: Spouse/significant other Available Help at Discharge: Family;Available 24 hours/day Type of Home: Apartment Home Access: Stairs to enter Entrance Stairs-Rails: Right;Left;Can reach both Entrance Stairs-Number of Steps: 6 Home Layout: One level Home Equipment: Shower seat;Grab bars -  tub/shower      Prior Function Level of Independence: Independent      ADL's / Homemaking Assistance Needed: Pt reports that he was independent but has difficulty bathing a dressing LB PTA and with standing from toilet  Comments: Pt very  independent.  Works as Games developer.  Has had gradual weakness over the past year in legs.     Hand Dominance   Dominant Hand: Right    Extremity/Trunk Assessment   Upper Extremity Assessment Upper Extremity Assessment: Defer to OT evaluation    Lower Extremity Assessment Lower Extremity Assessment: LLE deficits/detail;RLE deficits/detail RLE Deficits / Details: ROM WFL;  MMT hip 4/5, knee 4/5, ankle 5/5 RLE Sensation: WNL RLE Coordination: WNL LLE Deficits / Details: ROM WFL; MMT hip 4/5, knee 5/5, ankle 5/5 LLE Sensation: WNL LLE Coordination: WNL    Cervical / Trunk Assessment Cervical / Trunk Assessment: Normal  Communication   Communication: No difficulties  Cognition Arousal/Alertness: Awake/alert Behavior During Therapy: WFL for tasks assessed/performed Overall Cognitive Status: Within Functional Limits for tasks assessed                                        General Comments General comments (skin integrity, edema, etc.): VSS; pt reports most limitations due to abdominal pain    Exercises     Assessment/Plan    PT Assessment Patient needs continued PT services  PT Problem List Decreased strength;Decreased mobility;Decreased activity tolerance;Decreased balance;Decreased knowledge of use of DME       PT Treatment Interventions DME instruction;Therapeutic activities;Gait training;Therapeutic exercise;Patient/family education;Stair training;Functional mobility training;Balance training    PT Goals (Current goals can be found in the Care Plan section)  Acute Rehab PT Goals Patient Stated Goal: "find out why stomach hurts" PT Goal Formulation: With patient Time For Goal Achievement: 06/04/19 Potential to Achieve Goals: Good    Frequency Min 3X/week   Barriers to discharge        Co-evaluation PT/OT/SLP Co-Evaluation/Treatment: Yes Reason for Co-Treatment: For patient/therapist safety PT goals addressed during session:  Mobility/safety with mobility;Strengthening/ROM OT goals addressed during session: ADL's and self-care       AM-PAC PT "6 Clicks" Mobility  Outcome Measure Help needed turning from your back to your side while in a flat bed without using bedrails?: None Help needed moving from lying on your back to sitting on the side of a flat bed without using bedrails?: A Little Help needed moving to and from a bed to a chair (including a wheelchair)?: None Help needed standing up from a chair using your arms (e.g., wheelchair or bedside chair)?: None Help needed to walk in hospital room?: None Help needed climbing 3-5 steps with a railing? : A Little 6 Click Score: 22    End of Session   Activity Tolerance: Patient limited by pain Patient left: in bed;with call bell/phone within reach Nurse Communication: Mobility status PT Visit Diagnosis: Other abnormalities of gait and mobility (R26.89);Muscle weakness (generalized) (M62.81)    Time: 0388-8280 PT Time Calculation (min) (ACUTE ONLY): 26 min   Charges:   PT Evaluation $PT Eval Moderate Complexity: 1 Mod          Maggie Font, PT Acute Rehab Services Pager [phone] [facility] [phone] [facility] [phone]   Karlton Lemon 05/21/2019, 2:48 PM

## 2019-05-21 NOTE — Consult Note (Signed)
Chief Complaint   Chief Complaint  Patient presents with   Gait Problem   Otalgia    HPI   Consult requested by: Dr Domenic Polite, [facility] Reason for consult: Lumbar spinal stenosis  HPI: Raymond [name] is a 59 y.o. male with history of HTN, anxiety who presented to the ED with transient alteration in awareness and gait instability. 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 He remembers his coworkers helping him up and seating him in a chair, but otherwise has no recollection of the event. Since then, he has had worsening gait. He returned to work Saturday but felt "off" although completed his 12 hour shift. Sunday he took the day off and returned to work yesterday. His coworkers noticed his abnormal gait and recommended he come be evaluated. Neurology was consulted and patient underwent LP, MRI brain, MRI T & L spine. L spine MRI revealed multilevel spinal stenosis. A NS consultation was requested. He has also undergone CT abd/pelvis and found to have diverticulitis. I came by to evaluate the patient. He feels well this am, although has not been told about MRI results yet.   For the past year or so, patient has noticed a gradual, progressive "heaviness" in his BLE. Symptoms typically brought on with walking and resolved with rest. Symptoms have acutely worsened since Friday. He describes a wide based gait. He endorses a long standing history of chronic back stiffness in the am that resolves when he starts his day. He does endorse pain and N/T in BLE that is brought on by ambulation and relieved with rest. He denies bowel/bladder dysfunction, saddle anesthesia, neck pain. Has chronic R>L knee pain, N/T in hands only at night.   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/20/2019   Baker cyst 02/28/2015   Chest pain, exertional  02/28/2015   Baker's cyst 02/28/2015   Abnormal EKG 02/28/2015   MICROSCOPIC HEMATURIA 10/17/2007   ABDOMINAL PAIN 10/17/2007   HEMATURIA, HX OF 10/17/2007   PSYCHOLOGICAL STRESS 05/31/2007   Obesity, unspecified 04/19/2007   TOBACCO ABUSE 04/19/2007   RHINITIS, ALLERGIC NOS 04/19/2006   HYPERTENSION, BENIGN 03/09/2006   FATIGUE 03/09/2006   WEIGHT GAIN 03/09/2006   HEADACHE 03/09/2006   ANXIETY 03/02/2006   CHEST PAIN 03/02/2006    PMH: Past Medical History:  Diagnosis Date   Arthritis    Baker's cyst    right   Diverticulitis    Heart attack ([place]) 2006   Hypertension     PSH: Past Surgical History:  Procedure Laterality Date   CARDIAC CATHETERIZATION     CARPAL TUNNEL RELEASE     right   HAND SURGERY     left   KNEE SURGERY     left    Medications Prior to Admission  Medication Sig Dispense Refill Last Dose   aspirin EC 81 MG tablet Take 81 mg by mouth daily.   05/20/2019 at Unknown time   MULTIPLE VITAMIN PO Take 1 tablet by mouth daily.   05/20/2019 at Unknown time   naproxen sodium (ALEVE) 220 MG tablet Take 220 mg by mouth daily as needed (pain, headache).   05/20/2019 at Unknown time   amLODipine (NORVASC) 5 MG tablet Take 1 tablet (5 mg total) by mouth daily. Reported on 02/28/2015 (Patient not taking: Reported on 05/20/2019) 30 tablet 0 Not Taking  at Unknown time   hydrochlorothiazide (HYDRODIURIL) 25 MG tablet Take 1 tablet (25 mg total) by mouth daily. (Patient not taking: Reported on 05/20/2019) 30 tablet 1 Not Taking at Unknown time   HYDROcodone-acetaminophen (NORCO/VICODIN) 5-325 MG tablet Take 1 tablet by mouth every 4 (four) hours as needed. (Patient not taking: Reported on 05/20/2019) 5 tablet 0 Not Taking at Unknown time   lisinopril (PRINIVIL,ZESTRIL) 20 MG tablet Take 1 tablet (20 mg total) by mouth daily. (Patient not taking: Reported on 05/20/2019) 30 tablet 1 Not Taking at Unknown time   naproxen (NAPROSYN) 375 MG  tablet Take 1 tablet (375 mg total) by mouth 2 (two) times daily with a meal. (Patient not taking: Reported on 05/20/2019) 20 tablet 0 Not Taking at Unknown time   omeprazole (PRILOSEC) 20 MG capsule Take 1 capsule (20 mg total) by mouth daily. Reported on 02/28/2015 (Patient not taking: Reported on 08/04/2016) 30 capsule 0     SH: Social History   Tobacco Use   Smoking status: Current Every Day Smoker   Smokeless tobacco: Never Used  Substance Use Topics   Alcohol use: No   Drug use: No    MEDS: Prior to Admission medications   Medication Sig Start Date End Date Taking? Authorizing Provider  aspirin EC 81 MG tablet Take 81 mg by mouth daily.   Yes [provider]  MULTIPLE VITAMIN PO Take 1 tablet by mouth daily.   Yes [provider]  naproxen sodium (ALEVE) 220 MG tablet Take 220 mg by mouth daily as needed (pain, headache).   Yes [provider]  amLODipine (NORVASC) 5 MG tablet Take 1 tablet (5 mg total) by mouth daily. Reported on 02/28/2015 Patient not taking: Reported on 05/20/2019 03/02/15   Cristal Ford, DO  hydrochlorothiazide (HYDRODIURIL) 25 MG tablet Take 1 tablet (25 mg total) by mouth daily. Patient not taking: Reported on 05/20/2019 08/04/16   Robinson, [place] N, PA-C  HYDROcodone-acetaminophen (NORCO/VICODIN) 5-325 MG tablet Take 1 tablet by mouth every 4 (four) hours as needed. Patient not taking: Reported on 05/20/2019 04/10/19   Maudie Flakes, MD  lisinopril (PRINIVIL,ZESTRIL) 20 MG tablet Take 1 tablet (20 mg total) by mouth daily. Patient not taking: Reported on 05/20/2019 08/04/16   Robinson, [place] N, PA-C  naproxen (NAPROSYN) 375 MG tablet Take 1 tablet (375 mg total) by mouth 2 (two) times daily with a meal. Patient not taking: Reported on 05/20/2019 04/10/19   Maudie Flakes, MD  omeprazole (PRILOSEC) 20 MG capsule Take 1 capsule (20 mg total) by mouth daily. Reported on 02/28/2015 Patient not taking: Reported on 08/04/2016 03/02/15    Cristal Ford, DO    ALLERGY: No Known Allergies  Social History   Tobacco Use   Smoking status: Current Every Day Smoker   Smokeless tobacco: Never Used  Substance Use Topics   Alcohol use: No     Family History  Problem Relation Age of Onset   Lung cancer Mother    Heart failure Sister      ROS   Review of Systems  Constitutional: Negative.   HENT: Negative.   Eyes: Negative for blurred vision, double vision and photophobia.  Respiratory: Negative.   Cardiovascular: Negative.   Gastrointestinal: Positive for nausea.  Genitourinary: Negative.   Musculoskeletal: Positive for back pain and myalgias. Negative for neck pain.  Skin: Negative.   Neurological: Positive for tingling and weakness (BLE generalized). Negative for dizziness, tremors, sensory change, speech change, focal weakness and headaches.  Exam   Vitals:   05/21/19 0623 05/21/19 0625  BP: 137/86 137/86  Pulse: 82 84  Resp: 15   Temp: 99.8 F (37.7 C) 99.8 F (37.7 C)  SpO2: 91% 90%   General appearance: WDWN, NAD Eyes: No scleral injection Cardiovascular: Regular rate and rhythm without murmurs, rubs, gallops. No edema or variciosities. Distal pulses normal. Pulmonary: Effort normal, non-labored breathing Musculoskeletal:     Muscle tone upper extremities: Normal    Muscle tone lower extremities: Normal    Motor exam: Upper Extremities Deltoid Bicep Tricep Grip  Right 5/5 5/5 5/5 5/5  Left 5/5 5/5 5/5 5/5   Lower Extremity IP Quad PF DF EHL  Right 4+/5 4+/5 5/5 5/5 5/5  Left 4+/5 4+/5 5/5 5/5 5/5   Neurological Mental Status:    - Patient is awake, alert, oriented to person, place, month, year, and situation    - Patient is able to give a clear and coherent history.    - No signs of aphasia or neglect Cranial Nerves    - II: Visual Fields are full. PERRL    - III/IV/VI: EOMI without ptosis or diploplia.     - V: Facial sensation is grossly normal    - VII: Facial movement  is symmetric.     - VIII: hearing is intact to voice    - X: Uvula elevates symmetrically    - XI: Shoulder shrug is symmetric.    - XII: tongue is midline without atrophy or fasciculations.  Sensory: Sensation grossly intact to LT Deep Tendon Reflexes    - 2+ and symmetric in the biceps, 1+ patellar, achilles absent. No hoffmans Plantars   - Toes are downgoing bilaterally.  Cerebellar    - FNF and HKS are intact bilaterally   Results - Imaging/Labs   Results for orders placed or performed during the hospital encounter of 05/20/19 (from the past 48 hour(s))  Protime-INR     Status: None   Collection Time: 05/20/19  1:24 PM  Result Value Ref Range   Prothrombin Time 12.6 11.4 - 15.2 seconds   INR 1.0 0.8 - 1.2    Comment: (NOTE) INR goal varies based on device and disease states. Performed at [facility], [place] [address] [place], [place] [address]   APTT     Status: None   Collection Time: 05/20/19  1:24 PM  Result Value Ref Range   aPTT 28 24 - 36 seconds    Comment: Performed at [facility] [address] [place], [place] [address]  CBC     Status: Abnormal   Collection Time: 05/20/19  1:24 PM  Result Value Ref Range   WBC 10.6 (H) 4.0 - 10.5 K/uL   RBC 5.28 4.22 - 5.81 MIL/uL   Hemoglobin 15.0 13.0 - 17.0 g/dL   HCT 47.0 39.0 - 52.0 %   MCV 89.0 80.0 - 100.0 fL   MCH 28.4 26.0 - 34.0 pg   MCHC 31.9 30.0 - 36.0 g/dL   RDW 12.7 11.5 - 15.5 %   Platelets 236 150 - 400 K/uL   nRBC 0.0 0.0 - 0.2 %    Comment: Performed at [facility], [place] [address], [place], [place] [address]  Differential     Status: Abnormal   Collection Time: 05/20/19  1:24 PM  Result Value Ref Range   Neutrophils Relative % 72 %   Neutro Abs 7.6 1.7 - 7.7 K/uL   Lymphocytes Relative  15 %   Lymphs Abs 1.6 0.7 - 4.0 K/uL   Monocytes Relative 11 %   Monocytes Absolute 1.2 (H) 0.1 - 1.0 K/uL   Eosinophils Relative 1 %   Eosinophils Absolute 0.1 0.0 - 0.5 K/uL   Basophils  Relative 0 %   Basophils Absolute 0.0 0.0 - 0.1 K/uL   Immature Granulocytes 1 %   Abs Immature Granulocytes 0.06 0.00 - 0.07 K/uL    Comment: Performed at [facility] [address], [place], [place] [address]  Comprehensive metabolic panel     Status: Abnormal   Collection Time: 05/20/19  1:24 PM  Result Value Ref Range   Sodium 139 135 - 145 mmol/L   Potassium 3.7 3.5 - 5.1 mmol/L   Chloride 102 98 - 111 mmol/L   CO2 27 22 - 32 mmol/L   Glucose, Bld 115 (H) 70 - 99 mg/dL    Comment: Glucose reference range applies only to samples taken after fasting for at least 8 hours.   BUN 10 6 - 20 mg/dL   Creatinine, Ser 1.19 0.61 - 1.24 mg/dL   Calcium 9.6 8.9 - 10.3 mg/dL   Total Protein 7.2 6.5 - 8.1 g/dL   Albumin 3.9 3.5 - 5.0 g/dL   AST 27 15 - 41 U/L   ALT 23 0 - 44 U/L   Alkaline Phosphatase 87 38 - 126 U/L   Total Bilirubin 0.5 0.3 - 1.2 mg/dL   GFR calc non Af Amer >60 >60 mL/min   GFR calc Af Amer >60 >60 mL/min   Anion gap 10 5 - 15    Comment: Performed at [facility], [place] [address], [place], [place] [address]  Urine rapid drug screen (hosp performed)     Status: None   Collection Time: 05/20/19  4:59 PM  Result Value Ref Range   Opiates NONE DETECTED NONE DETECTED   Cocaine NONE DETECTED NONE DETECTED   Benzodiazepines NONE DETECTED NONE DETECTED   Amphetamines NONE DETECTED NONE DETECTED   Tetrahydrocannabinol NONE DETECTED NONE DETECTED   Barbiturates NONE DETECTED NONE DETECTED    Comment: (NOTE) DRUG SCREEN FOR MEDICAL PURPOSES ONLY.  IF CONFIRMATION IS NEEDED FOR ANY PURPOSE, NOTIFY LAB WITHIN 5 DAYS. LOWEST DETECTABLE LIMITS FOR URINE DRUG SCREEN Drug Class                     Cutoff (ng/mL) Amphetamine and metabolites    1000 Barbiturate and metabolites    200 Benzodiazepine                 712 Tricyclics and metabolites     300 Opiates and metabolites        300 Cocaine and metabolites        300 THC                             50 Performed at [facility], [place] [address], [place], [place] [address]   Urinalysis, Routine w reflex microscopic     Status: None   Collection Time: 05/20/19  4:59 PM  Result Value Ref Range   Color, Urine YELLOW YELLOW   APPearance CLEAR CLEAR   Specific Gravity, Urine 1.016 1.005 - 1.030   pH 6.0 5.0 - 8.0   Glucose, UA NEGATIVE NEGATIVE mg/dL   Hgb urine dipstick NEGATIVE NEGATIVE   Bilirubin Urine NEGATIVE NEGATIVE   Ketones, ur  NEGATIVE NEGATIVE mg/dL   Protein, ur NEGATIVE NEGATIVE mg/dL   Nitrite NEGATIVE NEGATIVE   Leukocytes,Ua NEGATIVE NEGATIVE    Comment: Performed at [facility] [address] [place], [place] [address]  Ethanol     Status: None   Collection Time: 05/20/19  7:12 PM  Result Value Ref Range   Alcohol, Ethyl (B) <10 <10 mg/dL    Comment: (NOTE) Lowest detectable limit for serum alcohol is 10 mg/dL. For medical purposes only. Performed at [facility], [place] [address] [place], [place] [address]   I-stat chem 8, ED     Status: None   Collection Time: 05/20/19  7:41 PM  Result Value Ref Range   Sodium 137 135 - 145 mmol/L   Potassium 3.8 3.5 - 5.1 mmol/L   Chloride 102 98 - 111 mmol/L   BUN 13 6 - 20 mg/dL   Creatinine, Ser 1.00 0.61 - 1.24 mg/dL   Glucose, Bld 98 70 - 99 mg/dL    Comment: Glucose reference range applies only to samples taken after fasting for at least 8 hours.   Calcium, Ion 1.17 1.15 - 1.40 mmol/L   TCO2 30 22 - 32 mmol/L   Hemoglobin 15.3 13.0 - 17.0 g/dL   HCT 45.0 39.0 - 52.0 %  CSF cell count with differential collection tube #: 1     Status: None   Collection Time: 05/20/19  7:50 PM  Result Value Ref Range   Tube # 1    Color, CSF COLORLESS COLORLESS   Appearance, CSF CLEAR CLEAR   Supernatant NOT INDICATED    RBC Count, CSF 0 0 /cu mm   WBC, CSF 2 0 - 5 /cu mm   Other Cells, CSF FEW LYMPHOCYTES OCCASIONAL MONOS/MACROS     Comment: Performed at [facility] [address]  [place], [place] [address]  CSF cell count with differential collection tube #: 4     Status: Abnormal   Collection Time: 05/20/19  7:50 PM  Result Value Ref Range   Tube # 4    Color, CSF COLORLESS COLORLESS   Appearance, CSF CLEAR CLEAR   Supernatant NOT INDICATED    RBC Count, CSF 1 (H) 0 /cu mm   WBC, CSF 2 0 - 5 /cu mm   Other Cells, CSF FEW LYMPHOCYTES OCCASIONAL MONOS/MACROS     Comment: Performed at [facility] [address] [place], [place] [address]  Glucose, CSF     Status: None   Collection Time: 05/20/19  7:50 PM  Result Value Ref Range   Glucose, CSF 63 40 - 70 mg/dL    Comment: Performed at [facility] [address], [place], [place] [address]  Protein, CSF     Status: None   Collection Time: 05/20/19  7:50 PM  Result Value Ref Range   Total  Protein, CSF 43 15 - 45 mg/dL    Comment: Performed at [facility] [address], [place], [place] [address]  Gram stain     Status: None   Collection Time: 05/20/19  7:53 PM   Specimen: CSF; Cerebrospinal Fluid  Result Value Ref Range   Specimen Description CSF    Special Requests NONE    Gram Stain      WBC PRESENT, PREDOMINANTLY MONONUCLEAR NO ORGANISMS SEEN CYTOSPIN SMEAR Performed at [facility], [place] [address] [place], [place] [address]    Report Status 05/20/2019 FINAL  SARS Coronavirus 2 by RT PCR (hospital order, performed in [facility] hospital lab) Nasopharyngeal Nasopharyngeal Swab     Status: None   Collection Time: 05/20/19  8:51 PM   Specimen: Nasopharyngeal Swab  Result Value Ref Range   SARS Coronavirus 2 NEGATIVE NEGATIVE    Comment: (NOTE) SARS-CoV-2 target nucleic acids are NOT DETECTED. The SARS-CoV-2 RNA is generally detectable in upper and lower respiratory specimens during the acute phase of infection. The lowest concentration of SARS-CoV-2 viral copies this assay can detect is 250 copies / mL. A negative result does not preclude SARS-CoV-2 infection and should  not be used as the sole basis for treatment or other patient management decisions.  A negative result may occur with improper specimen collection / handling, submission of specimen other than nasopharyngeal swab, presence of viral mutation(s) within the areas targeted by this assay, and inadequate number of viral copies (<250 copies / mL). A negative result must be combined with clinical observations, patient history, and epidemiological information. Fact Sheet for Patients:   StrictlyIdeas.no Fact Sheet for Healthcare Providers: BankingDealers.co.za This test is not yet approved or cleared  by the [place] FDA and has been authorized for detection and/or diagnosis of SARS-CoV-2 by FDA under an Emergency Use Authorization (EUA).  This EUA will remain in effect (meaning this test can be used) for the duration of the COVID-19 declaration under Section 564(b)(1) of the Act, 21 U.S.C. section 360bbb-3(b)(1), unless the authorization is terminated or revoked sooner. Performed at [facility], [place] [address] [place], [place] [address]   HIV Antibody (routine testing w rflx)     Status: None   Collection Time: 05/21/19  5:38 AM  Result Value Ref Range   HIV Screen 4th Generation wRfx Non Reactive Non Reactive    Comment: Performed at [facility], [place] [address], [place], [place] [address]  Magnesium     Status: None   Collection Time: 05/21/19  5:38 AM  Result Value Ref Range   Magnesium 1.7 1.7 - 2.4 mg/dL    Comment: Performed at [facility] [address] [place], [place] [address]  Phosphorus     Status: None   Collection Time: 05/21/19  5:38 AM  Result Value Ref Range   Phosphorus 3.2 2.5 - 4.6 mg/dL    Comment: Performed at [facility] [address] [place], [place] [address]  CBC WITH DIFFERENTIAL     Status: Abnormal   Collection Time: 05/21/19  5:38 AM  Result Value Ref Range   WBC 13.1 (H)  4.0 - 10.5 K/uL   RBC 5.08 4.22 - 5.81 MIL/uL   Hemoglobin 14.5 13.0 - 17.0 g/dL   HCT 45.4 39.0 - 52.0 %   MCV 89.4 80.0 - 100.0 fL   MCH 28.5 26.0 - 34.0 pg   MCHC 31.9 30.0 - 36.0 g/dL   RDW 12.8 11.5 - 15.5 %   Platelets 207 150 - 400 K/uL   nRBC 0.0 0.0 - 0.2 %   Neutrophils Relative % 74 %   Neutro Abs 9.6 (H) 1.7 - 7.7 K/uL   Lymphocytes Relative 12 %   Lymphs Abs 1.6 0.7 - 4.0 K/uL   Monocytes Relative 13 %   Monocytes Absolute 1.7 (H) 0.1 - 1.0 K/uL   Eosinophils Relative 1 %   Eosinophils Absolute 0.1 0.0 - 0.5 K/uL   Basophils Relative 0 %   Basophils Absolute 0.0 0.0 - 0.1 K/uL  Immature Granulocytes 0 %   Abs Immature Granulocytes 0.04 0.00 - 0.07 K/uL    Comment: Performed at [facility], [place] [address] [place], [place] [address]  TSH     Status: None   Collection Time: 05/21/19  5:38 AM  Result Value Ref Range   TSH 2.398 0.350 - 4.500 uIU/mL    Comment: Performed by a 3rd Generation assay with a functional sensitivity of <=0.01 uIU/mL. Performed at [facility], [place] [address] [place], [place] [address]   Comprehensive metabolic panel     Status: Abnormal   Collection Time: 05/21/19  5:38 AM  Result Value Ref Range   Sodium 137 135 - 145 mmol/L   Potassium 4.1 3.5 - 5.1 mmol/L   Chloride 101 98 - 111 mmol/L   CO2 26 22 - 32 mmol/L   Glucose, Bld 126 (H) 70 - 99 mg/dL    Comment: Glucose reference range applies only to samples taken after fasting for at least 8 hours.   BUN 16 6 - 20 mg/dL   Creatinine, Ser 1.20 0.61 - 1.24 mg/dL   Calcium 8.9 8.9 - 10.3 mg/dL   Total Protein 6.6 6.5 - 8.1 g/dL   Albumin 3.4 (L) 3.5 - 5.0 g/dL   AST 18 15 - 41 U/L   ALT 22 0 - 44 U/L   Alkaline Phosphatase 76 38 - 126 U/L   Total Bilirubin 0.5 0.3 - 1.2 mg/dL   GFR calc non Af Amer >60 >60 mL/min   GFR calc Af Amer >60 >60 mL/min   Anion gap 10 5 - 15    Comment: Performed at [facility], [place] [address], [place], [place] [address]  CK      Status: None   Collection Time: 05/21/19  5:38 AM  Result Value Ref Range   Total CK 244 49 - 397 U/L    Comment: Performed at [facility], [place] [address] [place], [place] [address]  Troponin I (High Sensitivity)     Status: None   Collection Time: 05/21/19  5:38 AM  Result Value Ref Range   Troponin I (High Sensitivity) 11 <18 ng/L    Comment: (NOTE) Elevated high sensitivity troponin I (hsTnI) values and significant  changes across serial measurements may suggest ACS but many other  chronic and acute conditions are known to elevate hsTnI results.  Refer to the "Links" section for chest pain algorithms and additional  guidance. Performed at [facility], [place] [address] [place] [place], [place] [address]     CT HEAD WO CONTRAST  Result Date: 05/20/2019 CLINICAL DATA:  Ataxia, stroke suspected, loss of balance EXAM: CT HEAD WITHOUT CONTRAST TECHNIQUE: Contiguous axial images were obtained from the base of the skull through the vertex without intravenous contrast. COMPARISON:  None. FINDINGS: Brain: No evidence of acute infarction, hemorrhage, hydrocephalus, extra-axial collection or mass lesion/mass effect. Vascular: Atherosclerotic calcification of the carotid siphons. No hyperdense vessel. Skull: No calvarial fracture or suspicious osseous lesion. No scalp swelling or hematoma. Sinuses/Orbits: Paranasal sinuses and mastoid air cells are predominantly clear. Included orbital structures are unremarkable. Other: None IMPRESSION: No acute intracranial findings. If there is persisting clinical concern for infarct, MRI is more sensitive and specific for early changes of ischemia. Electronically Signed   By: Lovena Le M.D.   On: 05/20/2019 17:59   MR BRAIN WO CONTRAST  Result Date: 05/20/2019 CLINICAL DATA:  59 year old male with ataxia, loss of balance.  EXAM: MRI HEAD WITHOUT CONTRAST TECHNIQUE: Multiplanar, multiecho pulse sequences of the brain and surrounding structures were  obtained without intravenous contrast. COMPARISON:  Head CT 1748 hours today. FINDINGS: Brain: No restricted diffusion to suggest acute infarction. No midline shift, mass effect, evidence of mass lesion, ventriculomegaly, extra-axial collection or acute intracranial hemorrhage. Cervicomedullary junction and pituitary are within normal limits. [place] Cables and white matter signal is within normal limits for age throughout the brain. No cortical encephalomalacia or chronic cerebral blood products identified. Vascular: Major intracranial vascular flow voids are preserved. Skull and upper cervical spine: Negative visible cervical spine, bone marrow signal. Sinuses/Orbits: Mildly Disconjugate gaze but otherwise negative orbits. Paranasal sinuses and mastoids are stable and well pneumatized. Other: Grossly normal visible internal auditory structures. Stylomastoid foramina appear normal. Scalp and face soft tissues appear negative. IMPRESSION: No acute intracranial abnormality. Normal for age noncontrast MRI appearance of the brain. No explanation for ataxia. Electronically Signed   By: Genevie Ann M.D.   On: 05/20/2019 18:36   MR THORACIC SPINE W WO CONTRAST  Result Date: 05/20/2019 CLINICAL DATA:  Initial evaluation for bilateral lower extremity weakness in a reflexia. EXAM: MRI THORACIC AND LUMBAR SPINE WITHOUT AND WITH CONTRAST TECHNIQUE: Multiplanar and multiecho pulse sequences of the thoracic and lumbar spine were obtained without and with intravenous contrast. CONTRAST:  96mL GADAVIST GADOBUTROL 1 MMOL/ML IV SOLN COMPARISON:  None. FINDINGS: MRI THORACIC SPINE FINDINGS Alignment: Straightening of the normal thoracic kyphosis. No listhesis or subluxation. Vertebrae: Vertebral body height maintained without evidence for acute or chronic fracture. Bone marrow signal intensity within normal limits. No discrete or worrisome osseous lesions. No abnormal marrow edema or enhancement. Cord: Signal intensity within the thoracic  spinal cord is normal. Normal cord caliber and morphology. No abnormal enhancement. No epidural collections. Paraspinal and other soft tissues: Paraspinous soft tissues within normal limits. Visualized lungs are grossly clear. Visualized visceral structures within normal limits. Disc levels: Multilevel degenerative spondylosis noted within the cervical spine on counter sequence without high-grade stenosis. T1-2:  Normal interspace.  Mild facet hypertrophy.  No stenosis. T2-3: Mild disc bulge.  No canal or foraminal stenosis. T3-4:  Unremarkable. T4-5:  Unremarkable. T5-6:  Unremarkable. T6-7:  Minimal disc bulge.  No canal or foraminal stenosis. T7-8:  Mild diffuse disc bulge.  No canal or foraminal stenosis. T8-9: Mild diffuse disc bulge. Superimposed right subarticular to foraminal disc protrusion (series 25, image 22). No significant spinal stenosis or cord deformity. Foramina remain patent. T9-10: Mild diffuse disc bulge. Posterior element hypertrophy. No significant canal or foraminal stenosis. T10-11: Mild diffuse disc bulge. Left greater than right facet hypertrophy. No significant spinal stenosis. Moderate left foraminal narrowing. T11-12: Mild diffuse disc bulge with facet hypertrophy. No spinal stenosis. Mild-to-moderate left neural foraminal narrowing. T12-L1:  Unremarkable. MRI LUMBAR SPINE FINDINGS Segmentation: Standard. Lowest well-formed disc space labeled the L5-S1 level. Alignment: Straightening of the normal lumbar lordosis with underlying trace levoscoliosis. No listhesis. Vertebrae: Vertebral body height maintained without evidence for acute or chronic fracture. Bone marrow signal intensity within normal limits. No discrete or worrisome osseous lesions. Prominent discogenic reactive endplate changes present about the L3-4 and L4-5 interspaces. No other abnormal marrow edema or enhancement. No evidence for osteomyelitis discitis or septic arthritis. Conus medullaris: Extends to the L1-2 level and  appears normal. Undulation of the nerve roots of the cauda equina related to spinal stenosis at L2-3 through L4-5 noted. No abnormal nerve root enhancement. No epidural collections. Paraspinal and other soft tissues: Paraspinous soft tissues  within normal limits. Visualized visceral structures are normal. Disc levels: L1-2: Normal interspace. Mild left greater than right facet hypertrophy. No canal or foraminal stenosis. L2-3: Diffuse disc bulge with disc desiccation and intervertebral disc space narrowing. Mild facet and ligament flavum hypertrophy. Resultant moderate spinal stenosis. Foramina remain patent. L3-4: Advance degenerative intervertebral disc space narrowing with diffuse disc bulge and disc desiccation. Reactive endplate changes with marginal endplate osteophytic spurring. Mild to moderate facet hypertrophy. Trace joint effusion noted on the left. Epidural lipomatosis. Resultant moderate to severe canal with bilateral subarticular stenosis. Moderate bilateral L3 foraminal narrowing. L4-5: Chronic intervertebral disc space narrowing with diffuse disc bulge and disc desiccation. Reactive endplate changes with marginal endplate osteophytic spurring. Moderate facet hypertrophy. Epidural lipomatosis. Resultant moderate to severe spinal stenosis, with moderate bilateral L4 foraminal narrowing, worse on the right. L5-S1: Mild disc desiccation without significant disc bulge. Moderate facet hypertrophy. Epidural lipomatosis. No canal or lateral recess stenosis. Foramina remain patent. IMPRESSION: MRI THORACIC SPINE IMPRESSION: 1. No acute abnormality within the thoracic spine with normal MRI appearance of the thoracic spinal cord. No findings to explain patient's symptoms identified. 2. Multilevel degenerative disc bulging throughout the mid and lower thoracic spine as above without significant spinal stenosis or cord impingement. Mild to moderate left foraminal narrowing at T10-11 and T11-12 related to disc  bulge and facet hypertrophy. No other significant foraminal encroachment. MRI LUMBAR SPINE IMPRESSION: 1. No acute abnormality within the lumbar spine. 2. Multifactorial degenerative changes at L2-3 through L4-5 with resultant moderate to severe diffuse spinal stenosis, with moderate bilateral L3 and L4 foraminal narrowing. 3. Undo lay shin of the nerve roots of the cauda equina secondary to underlying spinal stenosis. Finding could contribute to lower extremity symptoms. No abnormal nerve root enhancement or other acute abnormality. Electronically Signed   By: Jeannine Boga M.D.   On: 05/20/2019 23:14   MR Lumbar Spine W Wo Contrast  Result Date: 05/20/2019 CLINICAL DATA:  Initial evaluation for bilateral lower extremity weakness in a reflexia. EXAM: MRI THORACIC AND LUMBAR SPINE WITHOUT AND WITH CONTRAST TECHNIQUE: Multiplanar and multiecho pulse sequences of the thoracic and lumbar spine were obtained without and with intravenous contrast. CONTRAST:  65mL GADAVIST GADOBUTROL 1 MMOL/ML IV SOLN COMPARISON:  None. FINDINGS: MRI THORACIC SPINE FINDINGS Alignment: Straightening of the normal thoracic kyphosis. No listhesis or subluxation. Vertebrae: Vertebral body height maintained without evidence for acute or chronic fracture. Bone marrow signal intensity within normal limits. No discrete or worrisome osseous lesions. No abnormal marrow edema or enhancement. Cord: Signal intensity within the thoracic spinal cord is normal. Normal cord caliber and morphology. No abnormal enhancement. No epidural collections. Paraspinal and other soft tissues: Paraspinous soft tissues within normal limits. Visualized lungs are grossly clear. Visualized visceral structures within normal limits. Disc levels: Multilevel degenerative spondylosis noted within the cervical spine on counter sequence without high-grade stenosis. T1-2:  Normal interspace.  Mild facet hypertrophy.  No stenosis. T2-3: Mild disc bulge.  No canal or  foraminal stenosis. T3-4:  Unremarkable. T4-5:  Unremarkable. T5-6:  Unremarkable. T6-7:  Minimal disc bulge.  No canal or foraminal stenosis. T7-8:  Mild diffuse disc bulge.  No canal or foraminal stenosis. T8-9: Mild diffuse disc bulge. Superimposed right subarticular to foraminal disc protrusion (series 25, image 22). No significant spinal stenosis or cord deformity. Foramina remain patent. T9-10: Mild diffuse disc bulge. Posterior element hypertrophy. No significant canal or foraminal stenosis. T10-11: Mild diffuse disc bulge. Left greater than right facet hypertrophy. No significant spinal  stenosis. Moderate left foraminal narrowing. T11-12: Mild diffuse disc bulge with facet hypertrophy. No spinal stenosis. Mild-to-moderate left neural foraminal narrowing. T12-L1:  Unremarkable. MRI LUMBAR SPINE FINDINGS Segmentation: Standard. Lowest well-formed disc space labeled the L5-S1 level. Alignment: Straightening of the normal lumbar lordosis with underlying trace levoscoliosis. No listhesis. Vertebrae: Vertebral body height maintained without evidence for acute or chronic fracture. Bone marrow signal intensity within normal limits. No discrete or worrisome osseous lesions. Prominent discogenic reactive endplate changes present about the L3-4 and L4-5 interspaces. No other abnormal marrow edema or enhancement. No evidence for osteomyelitis discitis or septic arthritis. Conus medullaris: Extends to the L1-2 level and appears normal. Undulation of the nerve roots of the cauda equina related to spinal stenosis at L2-3 through L4-5 noted. No abnormal nerve root enhancement. No epidural collections. Paraspinal and other soft tissues: Paraspinous soft tissues within normal limits. Visualized visceral structures are normal. Disc levels: L1-2: Normal interspace. Mild left greater than right facet hypertrophy. No canal or foraminal stenosis. L2-3: Diffuse disc bulge with disc desiccation and intervertebral disc space  narrowing. Mild facet and ligament flavum hypertrophy. Resultant moderate spinal stenosis. Foramina remain patent. L3-4: Advance degenerative intervertebral disc space narrowing with diffuse disc bulge and disc desiccation. Reactive endplate changes with marginal endplate osteophytic spurring. Mild to moderate facet hypertrophy. Trace joint effusion noted on the left. Epidural lipomatosis. Resultant moderate to severe canal with bilateral subarticular stenosis. Moderate bilateral L3 foraminal narrowing. L4-5: Chronic intervertebral disc space narrowing with diffuse disc bulge and disc desiccation. Reactive endplate changes with marginal endplate osteophytic spurring. Moderate facet hypertrophy. Epidural lipomatosis. Resultant moderate to severe spinal stenosis, with moderate bilateral L4 foraminal narrowing, worse on the right. L5-S1: Mild disc desiccation without significant disc bulge. Moderate facet hypertrophy. Epidural lipomatosis. No canal or lateral recess stenosis. Foramina remain patent. IMPRESSION: MRI THORACIC SPINE IMPRESSION: 1. No acute abnormality within the thoracic spine with normal MRI appearance of the thoracic spinal cord. No findings to explain patient's symptoms identified. 2. Multilevel degenerative disc bulging throughout the mid and lower thoracic spine as above without significant spinal stenosis or cord impingement. Mild to moderate left foraminal narrowing at T10-11 and T11-12 related to disc bulge and facet hypertrophy. No other significant foraminal encroachment. MRI LUMBAR SPINE IMPRESSION: 1. No acute abnormality within the lumbar spine. 2. Multifactorial degenerative changes at L2-3 through L4-5 with resultant moderate to severe diffuse spinal stenosis, with moderate bilateral L3 and L4 foraminal narrowing. 3. Undo lay shin of the nerve roots of the cauda equina secondary to underlying spinal stenosis. Finding could contribute to lower extremity symptoms. No abnormal nerve root  enhancement or other acute abnormality. Electronically Signed   By: Jeannine Boga M.D.   On: 05/20/2019 23:14   CT ABDOMEN PELVIS W CONTRAST  Result Date: 05/21/2019 CLINICAL DATA:  Emesis EXAM: CT ABDOMEN AND PELVIS WITH CONTRAST TECHNIQUE: Multidetector CT imaging of the abdomen and pelvis was performed using the standard protocol following bolus administration of intravenous contrast. CONTRAST:  143mL OMNIPAQUE IOHEXOL 300 MG/ML  SOLN COMPARISON:  CT 04/10/2019 FINDINGS: Lower chest: Lung bases demonstrate no acute consolidation or effusion. Cardiac size within normal limits. Hepatobiliary: Calcified gallstones. No focal hepatic abnormality. No biliary dilatation. Possible mild wall thickening at the gastric fundus. Pancreas: Unremarkable. No pancreatic ductal dilatation or surrounding inflammatory changes. Spleen: Normal in size without focal abnormality. Adrenals/Urinary Tract: Adrenal glands are unremarkable. Kidneys are without hydronephrosis or focal lesion. Punctate stones within the bilateral kidneys. Bladder is unremarkable. Stomach/Bowel: The stomach  is nonenlarged. No dilated small bowel. Negative appendix. Wall thickening and inflammatory change involving the sigmoid colon consistent with acute diverticulitis. No perforation. Vascular/Lymphatic: Moderate aortic atherosclerosis without aneurysm. Subcentimeter left iliac nodes. Reproductive: Prostate is unremarkable. Other: No free air. Small amount of fluid in the left lower quadrant Musculoskeletal: Advanced degenerative change at L3-L4 and L4-L5. IMPRESSION: 1. Findings consistent with acute sigmoid colon diverticulitis. No perforation or organized abscess. 2. Gallstones. Possible wall thickening at the gallbladder fundus, ultrasound correlation as indicated 3. Nonobstructing kidney stone Electronically Signed   By: Donavan Foil M.D.   On: 05/21/2019 03:13   DG Chest Port 1 View  Result Date: 05/20/2019 CLINICAL DATA:  Weakness EXAM:  PORTABLE CHEST 1 VIEW COMPARISON:  07/06/2005 FINDINGS: Heart and mediastinal contours are within normal limits. No focal opacities or effusions. No acute bony abnormality. IMPRESSION: No active disease. Electronically Signed   By: Rolm Baptise M.D.   On: 05/20/2019 22:46    Impression/Plan   59 y.o. male with transient alteration of awareness Friday and acute worsening of chronic BLE "heaviness" and gait instability. He has great strength in BLE, slight proximal weakness but distally normal. MRI L spine reviewed and shows multifactorial mod-severe spinal stenosis from L2-3 to L4-5. Also noted to have diverticulitis.  Imaging reviewed with Dr Kathyrn Sheriff. 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 There is no need to proceed with emergent decompression at this time. We rec he continue to be worked up by [facility] & be treated for his diverticulitis He should work with PT/OT and see how he does during admission. We will follow to ensure he progresses well. Should he not make improvements, we could consider decompression, but is not indicated at this time.  Ferne Reus, PA-C [facility] [employer]

## 2019-05-21 NOTE — Plan of Care (Signed)
  Problem: Education: Goal: Knowledge of General Education information will improve Description: Including pain rating scale, medication(s)/side effects and non-pharmacologic comfort measures Outcome: Progressing   Problem: Health Behavior/Discharge Planning: Goal: Ability to manage health-related needs will improve Outcome: Progressing   Problem: Clinical Measurements: Goal: Ability to maintain clinical measurements within normal limits will improve Outcome: Progressing Goal: Will remain free from infection Outcome: Progressing Goal: Cardiovascular complication will be avoided Outcome: Progressing   Problem: Activity: Goal: Risk for activity intolerance will decrease Outcome: Progressing   Problem: Nutrition: Goal: Adequate nutrition will be maintained Outcome: Progressing   Problem: Pain Managment: Goal: General experience of comfort will improve Outcome: Progressing   Problem: Safety: Goal: Ability to remain free from injury will improve Outcome: Progressing   Problem: Skin Integrity: Goal: Risk for impaired skin integrity will decrease Outcome: Progressing

## 2019-05-21 NOTE — Progress Notes (Signed)
Echocardiogram 2D Echocardiogram has been performed.  Oneal Deputy Sherri Mcarthy 05/21/2019, 2:52 PM

## 2019-05-21 NOTE — Progress Notes (Signed)
EEG Completed; Results Pending  

## 2019-05-21 NOTE — Progress Notes (Signed)
VAST consulted to obtain 2nd IV access for procedure. Pt also needs current access assessed and possibly replaced. PT currently working with patient and will be approximately 20 more mins. VAST RN will return.

## 2019-05-22 LAB — CBC
HCT: 44.3 % (ref 39.0–52.0)
Hemoglobin: 14 g/dL (ref 13.0–17.0)
MCH: 28.1 pg (ref 26.0–34.0)
MCHC: 31.6 g/dL (ref 30.0–36.0)
MCV: 88.8 fL (ref 80.0–100.0)
Platelets: 207 10*3/uL (ref 150–400)
RBC: 4.99 MIL/uL (ref 4.22–5.81)
RDW: 13 % (ref 11.5–15.5)
WBC: 14.6 10*3/uL — ABNORMAL HIGH (ref 4.0–10.5)
nRBC: 0 % (ref 0.0–0.2)

## 2019-05-22 LAB — COMPREHENSIVE METABOLIC PANEL
ALT: 18 U/L (ref 0–44)
AST: 13 U/L — ABNORMAL LOW (ref 15–41)
Albumin: 3.2 g/dL — ABNORMAL LOW (ref 3.5–5.0)
Alkaline Phosphatase: 59 U/L (ref 38–126)
Anion gap: 10 (ref 5–15)
BUN: 14 mg/dL (ref 6–20)
CO2: 28 mmol/L (ref 22–32)
Calcium: 8.9 mg/dL (ref 8.9–10.3)
Chloride: 99 mmol/L (ref 98–111)
Creatinine, Ser: 1.13 mg/dL (ref 0.61–1.24)
GFR calc Af Amer: >60 mL/min (ref >60)
GFR calc non Af Amer: >60 mL/min (ref >60)
Glucose, Bld: 107 mg/dL — ABNORMAL HIGH (ref 70–99)
Potassium: 3.9 mmol/L (ref 3.5–5.1)
Sodium: 137 mmol/L (ref 135–145)
Total Bilirubin: 0.9 mg/dL (ref 0.3–1.2)
Total Protein: 6.8 g/dL (ref 6.5–8.1)

## 2019-05-22 LAB — HEMOGLOBIN A1C
Hgb A1c MFr Bld: 5.8 % — ABNORMAL HIGH (ref 4.8–5.6)
Mean Plasma Glucose: 120 mg/dL

## 2019-05-22 LAB — IGG CSF INDEX
Albumin CSF-mCnc: 31 mg/dL (ref 11–48)
Albumin: 4 g/dL (ref 3.8–4.9)
CSF IgG Index: 0.4 (ref 0.0–0.7)
IgG (Immunoglobin G), Serum: 1393 mg/dL (ref 603–1613)
IgG, CSF: 4.5 mg/dL (ref 0.0–8.6)
IgG/Alb Ratio, CSF: 0.15 (ref 0.00–0.25)

## 2019-05-22 MED ORDER — SIMETHICONE 80 MG PO CHEW
80.0000 mg | CHEWABLE_TABLET | Freq: Three times a day (TID) | ORAL | Status: DC | PRN
  Administered 2019-05-22: 80 mg via ORAL
  Filled 2019-05-22: qty 1

## 2019-05-22 MED ORDER — POLYETHYLENE GLYCOL 3350 17 G PO PACK
17.0000 g | PACK | Freq: Every day | ORAL | Status: DC
  Administered 2019-05-22 – 2019-05-25 (×3): 17 g via ORAL
  Filled 2019-05-22 (×4): qty 1

## 2019-05-22 MED ORDER — SENNOSIDES-DOCUSATE SODIUM 8.6-50 MG PO TABS
2.0000 | ORAL_TABLET | Freq: Two times a day (BID) | ORAL | Status: DC
  Administered 2019-05-22 – 2019-05-25 (×7): 2 via ORAL
  Filled 2019-05-22 (×8): qty 2

## 2019-05-22 NOTE — Progress Notes (Addendum)
  NEUROSURGERY PROGRESS NOTE   I have reviewed the pts history and MRI T/L spine. While he does have significant degenerative lumbar stenosis, it appears his gait is primarily limited by other medical issues including diverticulitis.  PT notes from yesterday reviewed. Patient's therapy was "mainly limited due to stabbing abdominal pain". He is min guard with gait and did not require use of assistive device. Again noted gait "limited due to abd pain".  I suspect as patient's diverticulitis is treated, his overall mobility will increase to the point he can be discharged home. We can certainly see this patient in the outpatient clinic to discuss elective treatment of his lumbar stenosis.  Consuella Lose, MD [facility]

## 2019-05-22 NOTE — Progress Notes (Signed)
PROGRESS NOTE  Raymond [name]  QJJ:[id number] DOB: 1960-03-03 DOA: 05/20/2019 PCP: Benito Mccreedy, MD   Brief Narrative: Raymond [name] a 58 y.o.malewith medical history significant of HTN, diverticulitis in 2018, presented to the emergency room with worsening balance, difficulties with ambulation that started 3 days prior to admission.  Patient reports chronic low back pain and difficulty initiating movements, getting out of bed, getting up from a seated position for several months.  His coworkers started noticing difficulties with walking in the last few days, day prior to admission he was not able to walk while at work and hence sent to the emergency room. -In the ED he was seen by neurology, had MRI brain and MRI LS spine, which noted severe spinal stenosis and undulation of the nerve roots of the cauda equina. Subsequent neurosurgery evaluation recommends follow up after discharge for consideration of elective surgery, though his currently preserved strength does not indicate urgent intervention.   -In addition also noted worsening left lower quadrant abdominal pain for the last several days, similar to prior episode of diverticulitis in 2018. Sigmoid diverticulitis without abscess or perforation was confirmed on CT. Ceftriaxone and flagyl were started, diet restricted to clear liquids and patient continues to have significant abdominal pain.   Assessment & Plan: Active Problems:   Obesity, unspecified   TOBACCO ABUSE   HYPERTENSION, BENIGN   Abnormal EKG   Paraplegia, incomplete (HCC)   Hx of diverticulitis of colon  Severe spinal stenosis Progressive gait disorder -MRI LS-spine noted severe spinal stenosis and undulation of nerve roots of cauda equina secondary to severe spinal stenosis. Neurosurgery consulted: "While he does have significant degenerative lumbar stenosis, it appears his gait is primarily limited by other medical issues including diverticulitis.Marland KitchenMarland KitchenWe can  certainly see this patient in the outpatient clinic to discuss elective treatment of his lumbar stenosis." per Dr. Kathyrn Sheriff. - Neurology evaluated the patient as well.  - Continue PT/OT  Acute diverticulitis: Without complication on CT. This is 3rd and worst episode. Will defer surgical evaluation at this time.  - Monitor leukocytosis, remains. - Clear liquids - IV analgesics, IV antiemetics prn.  - Continue ceftriaxone, flagyl - Will need repeat colonoscopy following resolution.   Tobacco abuse - Cessation counseling provided.  - Nicotine patch.   Hyperglycemia, prediabetes: HbA1c 5.8%.  - PCP follow up.   Possible syncopal event few days ago Versus fall -EKG with T wave inversion in lateral leads, some of this was noted in 2017 as well. No significant arrhythmias on telemetry thus far. Echo with preserved LVEF, no severe valvular abnormalities or regional WMA.   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  - Monitor  Obesity: Estimated body mass index is 41.61 kg/m as calculated from the following:   Height as of this encounter: 5\' 10"  (1.778 m).   Weight as of this encounter: 131.5 kg.  DVT prophylaxis: Lovenox Code Status: Full Family Communication: Wife at bedside Disposition Plan:  Status is: Inpatient  Remains inpatient appropriate because:Ongoing active pain requiring inpatient pain management and Inpatient level of care appropriate due to severity of illness   Dispo: The patient is from: Home              Anticipated d/c is to: Home              Anticipated d/c date is: > 3 days  Patient currently is not medically stable to d/c.  Consultants:   Neurology  Neurosurgery  Procedures:   None  Antimicrobials:  Ceftriaxone, flagyl   Subjective: Abdominal pain centered in LLQ and lower abdomen, constant, waxing waning, sharp, better with IV narcotics.  Objective: Vitals:    05/21/19 0625 05/21/19 1530 05/21/19 2321 05/22/19 0751  BP: 137/86 (!) 135/120 140/76 129/79  Pulse: 84 97 96 80  Resp:  18  12  Temp: 99.8 F (37.7 C) 99.1 F (37.3 C) 98.7 F (37.1 C) 99 F (37.2 C)  TempSrc:      SpO2: 90% 94% 94% 97%  Weight:      Height:        Intake/Output Summary (Last 24 hours) at 05/22/2019 1618 Last data filed at 05/22/2019 0500 Gross per 24 hour  Intake 200 ml  Output 500 ml  Net -300 ml   Filed Weights   05/20/19 1319  Weight: 131.5 kg    Gen: 59 y.o. male in no distress  Pulm: Non-labored breathing. Clear to auscultation bilaterally.  CV: Regular rate and rhythm. No murmur, rub, or gallop. No JVD, no pedal edema. GI: Abdomen soft, tender in LLQ, none at all in upper and LUQ, no rebound, +BS.  Ext: Warm, no deformities Skin: No rashes, lesions or ulcers Neuro: Alert and oriented. Mildly diminished strength at hip flexors, intact ankle extension and EHL, no other focal neurological deficits. Psych: Judgement and insight appear normal. Mood & affect appropriate.   Data Reviewed: I have personally reviewed following labs and imaging studies  CBC: Recent Labs  Lab 05/20/19 1324 05/20/19 1941 05/21/19 0538 05/22/19 0235  WBC 10.6*  --  13.1* 14.6*  NEUTROABS 7.6  --  9.6*  --   HGB 15.0 15.3 14.5 14.0  HCT 47.0 45.0 45.4 44.3  MCV 89.0  --  89.4 88.8  PLT 236  --  207 616   Basic Metabolic Panel: Recent Labs  Lab 05/20/19 1324 05/20/19 1941 05/21/19 0538 05/22/19 0235  NA 139 137 137 137  K 3.7 3.8 4.1 3.9  CL 102 102 101 99  CO2 27  --  26 28  GLUCOSE 115* 98 126* 107*  BUN 10 13 16 14   CREATININE 1.19 1.00 1.20 1.13  CALCIUM 9.6  --  8.9 8.9  MG  --   --  1.7  --   PHOS  --   --  3.2  --    GFR: Estimated Creatinine Clearance: 99.5 mL/min (by C-G formula based on SCr of 1.13 mg/dL). Liver Function Tests: Recent Labs  Lab 05/20/19 1324 05/20/19 1951 05/21/19 0538 05/22/19 0235  AST 27  --  18 13*  ALT 23  --  22  18  ALKPHOS 87  --  76 59  BILITOT 0.5  --  0.5 0.9  PROT 7.2  --  6.6 6.8  ALBUMIN 3.9 4.0 3.4* 3.2*   No results for input(s): LIPASE, AMYLASE in the last 168 hours. No results for input(s): AMMONIA in the last 168 hours. Coagulation Profile: Recent Labs  Lab 05/20/19 1324  INR 1.0   Cardiac Enzymes: Recent Labs  Lab 05/21/19 0538  CKTOTAL 244   BNP (last 3 results) No results for input(s): PROBNP in the last 8760 hours. HbA1C: Recent Labs    05/21/19 1033  HGBA1C 5.8*   CBG: No results for input(s): GLUCAP in the last 168 hours. Lipid Profile: No results for input(s): CHOL, HDL, LDLCALC, TRIG, CHOLHDL, LDLDIRECT in  the last 72 hours. Thyroid Function Tests: Recent Labs    05/21/19 0538  TSH 2.398   Anemia Panel: No results for input(s): VITAMINB12, FOLATE, FERRITIN, TIBC, IRON, RETICCTPCT in the last 72 hours. Urine analysis:    Component Value Date/Time   COLORURINE YELLOW 05/20/2019 [place] 05/20/2019 1659   LABSPEC 1.016 05/20/2019 1659   PHURINE 6.0 05/20/2019 1659   GLUCOSEU NEGATIVE 05/20/2019 1659   HGBUR NEGATIVE 05/20/2019 1659   HGBUR moderate 10/17/2007 1442   BILIRUBINUR NEGATIVE 05/20/2019 [place] 05/20/2019 [place] 05/20/2019 1659   UROBILINOGEN 1.0 07/17/2013 1048   NITRITE NEGATIVE 05/20/2019 [place] 05/20/2019 1659   Recent Results (from the past 240 hour(s))  Gram stain     Status: None   Collection Time: 05/20/19  7:53 PM   Specimen: CSF; Cerebrospinal Fluid  Result Value Ref Range Status   Specimen Description CSF  Final   Special Requests NONE  Final   Gram Stain   Final    WBC PRESENT, PREDOMINANTLY MONONUCLEAR NO ORGANISMS SEEN CYTOSPIN SMEAR Performed at [facility], [place] [address] [place], [place] [address]    Report Status 05/20/2019 FINAL  Final  SARS Coronavirus 2 by RT PCR (hospital order, performed in [facility] hospital lab)  Nasopharyngeal Nasopharyngeal Swab     Status: None   Collection Time: 05/20/19  8:51 PM   Specimen: Nasopharyngeal Swab  Result Value Ref Range Status   SARS Coronavirus 2 NEGATIVE NEGATIVE Final    Comment: (NOTE) SARS-CoV-2 target nucleic acids are NOT DETECTED. The SARS-CoV-2 RNA is generally detectable in upper and lower respiratory specimens during the acute phase of infection. The lowest concentration of SARS-CoV-2 viral copies this assay can detect is 250 copies / mL. A negative result does not preclude SARS-CoV-2 infection and should not be used as the sole basis for treatment or other patient management decisions.  A negative result may occur with improper specimen collection / handling, submission of specimen other than nasopharyngeal swab, presence of viral mutation(s) within the areas targeted by this assay, and inadequate number of viral copies (<250 copies / mL). A negative result must be combined with clinical observations, patient history, and epidemiological information. Fact Sheet for Patients:   StrictlyIdeas.no Fact Sheet for Healthcare Providers: BankingDealers.co.za This test is not yet approved or cleared  by the [place] FDA and has been authorized for detection and/or diagnosis of SARS-CoV-2 by FDA under an Emergency Use Authorization (EUA).  This EUA will remain in effect (meaning this test can be used) for the duration of the COVID-19 declaration under Section 564(b)(1) of the Act, 21 U.S.C. section 360bbb-3(b)(1), unless the authorization is terminated or revoked sooner. Performed at [facility], [place] [address] [place], [place] [address]       Radiology Studies: EEG  Result Date: 05/21/2019 Lora Havens, MD     05/21/2019  9:39 AM Patient Name: Raymond [name] MRN: [id number] Epilepsy Attending: Lora Havens Referring Physician/Provider: Dr Leonie Man Date: 05/21/2019 Duration:  29.26 mins Patient history: 59yo M with transient spell of altered awareness. EEG to evaluate for seizure Level of alertness: Awake, asleep AEDs during EEG study: None Technical aspects: This EEG study was done with scalp electrodes positioned according to the 10-20 International system of electrode placement. Electrical activity was acquired at a sampling rate of 500Hz  and reviewed with a high frequency filter of 70Hz  and  a low frequency filter of 1Hz . EEG data were recorded continuously and digitally stored. Description: The posterior dominant rhythm consists of 9-10 Hz activity of moderate voltage (25-35 uV) seen predominantly in posterior head regions, symmetric and reactive to eye opening and eye closing. Sleep was characterized by vertex waves, sleep spindles (12 to 14 Hz), maximal frontocentral region.  Hyperventilation and photic stimulation were not performed.   IMPRESSION: This study is within normal limits. No seizures or epileptiform discharges were seen throughout the recording. Lora Havens   CT HEAD WO CONTRAST  Result Date: 05/20/2019 CLINICAL DATA:  Ataxia, stroke suspected, loss of balance EXAM: CT HEAD WITHOUT CONTRAST TECHNIQUE: Contiguous axial images were obtained from the base of the skull through the vertex without intravenous contrast. COMPARISON:  None. FINDINGS: Brain: No evidence of acute infarction, hemorrhage, hydrocephalus, extra-axial collection or mass lesion/mass effect. Vascular: Atherosclerotic calcification of the carotid siphons. No hyperdense vessel. Skull: No calvarial fracture or suspicious osseous lesion. No scalp swelling or hematoma. Sinuses/Orbits: Paranasal sinuses and mastoid air cells are predominantly clear. Included orbital structures are unremarkable. Other: None IMPRESSION: No acute intracranial findings. If there is persisting clinical concern for infarct, MRI is more sensitive and specific for early changes of ischemia. Electronically Signed   By: Lovena Le M.D.   On: 05/20/2019 17:59   MR BRAIN WO CONTRAST  Result Date: 05/20/2019 CLINICAL DATA:  59 year old male with ataxia, loss of balance. EXAM: MRI HEAD WITHOUT CONTRAST TECHNIQUE: Multiplanar, multiecho pulse sequences of the brain and surrounding structures were obtained without intravenous contrast. COMPARISON:  Head CT 1748 hours today. FINDINGS: Brain: No restricted diffusion to suggest acute infarction. No midline shift, mass effect, evidence of mass lesion, ventriculomegaly, extra-axial collection or acute intracranial hemorrhage. Cervicomedullary junction and pituitary are within normal limits. Pearline Cables and white matter signal is within normal limits for age throughout the brain. No cortical encephalomalacia or chronic cerebral blood products identified. Vascular: Major intracranial vascular flow voids are preserved. Skull and upper cervical spine: Negative visible cervical spine, bone marrow signal. Sinuses/Orbits: Mildly Disconjugate gaze but otherwise negative orbits. Paranasal sinuses and mastoids are stable and well pneumatized. Other: Grossly normal visible internal auditory structures. Stylomastoid foramina appear normal. Scalp and face soft tissues appear negative. IMPRESSION: No acute intracranial abnormality. Normal for age noncontrast MRI appearance of the brain. No explanation for ataxia. Electronically Signed   By: Genevie Ann M.D.   On: 05/20/2019 18:36   MR THORACIC SPINE W WO CONTRAST  Result Date: 05/20/2019 CLINICAL DATA:  Initial evaluation for bilateral lower extremity weakness in a reflexia. EXAM: MRI THORACIC AND LUMBAR SPINE WITHOUT AND WITH CONTRAST TECHNIQUE: Multiplanar and multiecho pulse sequences of the thoracic and lumbar spine were obtained without and with intravenous contrast. CONTRAST:  4mL GADAVIST GADOBUTROL 1 MMOL/ML IV SOLN COMPARISON:  None. FINDINGS: MRI THORACIC SPINE FINDINGS Alignment: Straightening of the normal thoracic kyphosis. No listhesis or  subluxation. Vertebrae: Vertebral body height maintained without evidence for acute or chronic fracture. Bone marrow signal intensity within normal limits. No discrete or worrisome osseous lesions. No abnormal marrow edema or enhancement. Cord: Signal intensity within the thoracic spinal cord is normal. Normal cord caliber and morphology. No abnormal enhancement. No epidural collections. Paraspinal and other soft tissues: Paraspinous soft tissues within normal limits. Visualized lungs are grossly clear. Visualized visceral structures within normal limits. Disc levels: Multilevel degenerative spondylosis noted within the cervical spine on counter sequence without high-grade stenosis. T1-2:  Normal interspace.  Mild facet hypertrophy.  No stenosis. T2-3: Mild disc bulge.  No canal or foraminal stenosis. T3-4:  Unremarkable. T4-5:  Unremarkable. T5-6:  Unremarkable. T6-7:  Minimal disc bulge.  No canal or foraminal stenosis. T7-8:  Mild diffuse disc bulge.  No canal or foraminal stenosis. T8-9: Mild diffuse disc bulge. Superimposed right subarticular to foraminal disc protrusion (series 25, image 22). No significant spinal stenosis or cord deformity. Foramina remain patent. T9-10: Mild diffuse disc bulge. Posterior element hypertrophy. No significant canal or foraminal stenosis. T10-11: Mild diffuse disc bulge. Left greater than right facet hypertrophy. No significant spinal stenosis. Moderate left foraminal narrowing. T11-12: Mild diffuse disc bulge with facet hypertrophy. No spinal stenosis. Mild-to-moderate left neural foraminal narrowing. T12-L1:  Unremarkable. MRI LUMBAR SPINE FINDINGS Segmentation: Standard. Lowest well-formed disc space labeled the L5-S1 level. Alignment: Straightening of the normal lumbar lordosis with underlying trace levoscoliosis. No listhesis. Vertebrae: Vertebral body height maintained without evidence for acute or chronic fracture. Bone marrow signal intensity within normal limits. No  discrete or worrisome osseous lesions. Prominent discogenic reactive endplate changes present about the L3-4 and L4-5 interspaces. No other abnormal marrow edema or enhancement. No evidence for osteomyelitis discitis or septic arthritis. Conus medullaris: Extends to the L1-2 level and appears normal. Undulation of the nerve roots of the cauda equina related to spinal stenosis at L2-3 through L4-5 noted. No abnormal nerve root enhancement. No epidural collections. Paraspinal and other soft tissues: Paraspinous soft tissues within normal limits. Visualized visceral structures are normal. Disc levels: L1-2: Normal interspace. Mild left greater than right facet hypertrophy. No canal or foraminal stenosis. L2-3: Diffuse disc bulge with disc desiccation and intervertebral disc space narrowing. Mild facet and ligament flavum hypertrophy. Resultant moderate spinal stenosis. Foramina remain patent. L3-4: Advance degenerative intervertebral disc space narrowing with diffuse disc bulge and disc desiccation. Reactive endplate changes with marginal endplate osteophytic spurring. Mild to moderate facet hypertrophy. Trace joint effusion noted on the left. Epidural lipomatosis. Resultant moderate to severe canal with bilateral subarticular stenosis. Moderate bilateral L3 foraminal narrowing. L4-5: Chronic intervertebral disc space narrowing with diffuse disc bulge and disc desiccation. Reactive endplate changes with marginal endplate osteophytic spurring. Moderate facet hypertrophy. Epidural lipomatosis. Resultant moderate to severe spinal stenosis, with moderate bilateral L4 foraminal narrowing, worse on the right. L5-S1: Mild disc desiccation without significant disc bulge. Moderate facet hypertrophy. Epidural lipomatosis. No canal or lateral recess stenosis. Foramina remain patent. IMPRESSION: MRI THORACIC SPINE IMPRESSION: 1. No acute abnormality within the thoracic spine with normal MRI appearance of the thoracic spinal cord.  No findings to explain patient's symptoms identified. 2. Multilevel degenerative disc bulging throughout the mid and lower thoracic spine as above without significant spinal stenosis or cord impingement. Mild to moderate left foraminal narrowing at T10-11 and T11-12 related to disc bulge and facet hypertrophy. No other significant foraminal encroachment. MRI LUMBAR SPINE IMPRESSION: 1. No acute abnormality within the lumbar spine. 2. Multifactorial degenerative changes at L2-3 through L4-5 with resultant moderate to severe diffuse spinal stenosis, with moderate bilateral L3 and L4 foraminal narrowing. 3. Undo lay shin of the nerve roots of the cauda equina secondary to underlying spinal stenosis. Finding could contribute to lower extremity symptoms. No abnormal nerve root enhancement or other acute abnormality. Electronically Signed   By: Jeannine Boga M.D.   On: 05/20/2019 23:14   MR Lumbar Spine W Wo Contrast  Result Date: 05/20/2019 CLINICAL DATA:  Initial evaluation for bilateral lower extremity weakness in a reflexia. EXAM: MRI THORACIC AND LUMBAR SPINE WITHOUT AND WITH  CONTRAST TECHNIQUE: Multiplanar and multiecho pulse sequences of the thoracic and lumbar spine were obtained without and with intravenous contrast. CONTRAST:  56mL GADAVIST GADOBUTROL 1 MMOL/ML IV SOLN COMPARISON:  None. FINDINGS: MRI THORACIC SPINE FINDINGS Alignment: Straightening of the normal thoracic kyphosis. No listhesis or subluxation. Vertebrae: Vertebral body height maintained without evidence for acute or chronic fracture. Bone marrow signal intensity within normal limits. No discrete or worrisome osseous lesions. No abnormal marrow edema or enhancement. Cord: Signal intensity within the thoracic spinal cord is normal. Normal cord caliber and morphology. No abnormal enhancement. No epidural collections. Paraspinal and other soft tissues: Paraspinous soft tissues within normal limits. Visualized lungs are grossly clear.  Visualized visceral structures within normal limits. Disc levels: Multilevel degenerative spondylosis noted within the cervical spine on counter sequence without high-grade stenosis. T1-2:  Normal interspace.  Mild facet hypertrophy.  No stenosis. T2-3: Mild disc bulge.  No canal or foraminal stenosis. T3-4:  Unremarkable. T4-5:  Unremarkable. T5-6:  Unremarkable. T6-7:  Minimal disc bulge.  No canal or foraminal stenosis. T7-8:  Mild diffuse disc bulge.  No canal or foraminal stenosis. T8-9: Mild diffuse disc bulge. Superimposed right subarticular to foraminal disc protrusion (series 25, image 22). No significant spinal stenosis or cord deformity. Foramina remain patent. T9-10: Mild diffuse disc bulge. Posterior element hypertrophy. No significant canal or foraminal stenosis. T10-11: Mild diffuse disc bulge. Left greater than right facet hypertrophy. No significant spinal stenosis. Moderate left foraminal narrowing. T11-12: Mild diffuse disc bulge with facet hypertrophy. No spinal stenosis. Mild-to-moderate left neural foraminal narrowing. T12-L1:  Unremarkable. MRI LUMBAR SPINE FINDINGS Segmentation: Standard. Lowest well-formed disc space labeled the L5-S1 level. Alignment: Straightening of the normal lumbar lordosis with underlying trace levoscoliosis. No listhesis. Vertebrae: Vertebral body height maintained without evidence for acute or chronic fracture. Bone marrow signal intensity within normal limits. No discrete or worrisome osseous lesions. Prominent discogenic reactive endplate changes present about the L3-4 and L4-5 interspaces. No other abnormal marrow edema or enhancement. No evidence for osteomyelitis discitis or septic arthritis. Conus medullaris: Extends to the L1-2 level and appears normal. Undulation of the nerve roots of the cauda equina related to spinal stenosis at L2-3 through L4-5 noted. No abnormal nerve root enhancement. No epidural collections. Paraspinal and other soft tissues:  Paraspinous soft tissues within normal limits. Visualized visceral structures are normal. Disc levels: L1-2: Normal interspace. Mild left greater than right facet hypertrophy. No canal or foraminal stenosis. L2-3: Diffuse disc bulge with disc desiccation and intervertebral disc space narrowing. Mild facet and ligament flavum hypertrophy. Resultant moderate spinal stenosis. Foramina remain patent. L3-4: Advance degenerative intervertebral disc space narrowing with diffuse disc bulge and disc desiccation. Reactive endplate changes with marginal endplate osteophytic spurring. Mild to moderate facet hypertrophy. Trace joint effusion noted on the left. Epidural lipomatosis. Resultant moderate to severe canal with bilateral subarticular stenosis. Moderate bilateral L3 foraminal narrowing. L4-5: Chronic intervertebral disc space narrowing with diffuse disc bulge and disc desiccation. Reactive endplate changes with marginal endplate osteophytic spurring. Moderate facet hypertrophy. Epidural lipomatosis. Resultant moderate to severe spinal stenosis, with moderate bilateral L4 foraminal narrowing, worse on the right. L5-S1: Mild disc desiccation without significant disc bulge. Moderate facet hypertrophy. Epidural lipomatosis. No canal or lateral recess stenosis. Foramina remain patent. IMPRESSION: MRI THORACIC SPINE IMPRESSION: 1. No acute abnormality within the thoracic spine with normal MRI appearance of the thoracic spinal cord. No findings to explain patient's symptoms identified. 2. Multilevel degenerative disc bulging throughout the mid and lower thoracic spine as  above without significant spinal stenosis or cord impingement. Mild to moderate left foraminal narrowing at T10-11 and T11-12 related to disc bulge and facet hypertrophy. No other significant foraminal encroachment. MRI LUMBAR SPINE IMPRESSION: 1. No acute abnormality within the lumbar spine. 2. Multifactorial degenerative changes at L2-3 through L4-5 with  resultant moderate to severe diffuse spinal stenosis, with moderate bilateral L3 and L4 foraminal narrowing. 3. Undo lay shin of the nerve roots of the cauda equina secondary to underlying spinal stenosis. Finding could contribute to lower extremity symptoms. No abnormal nerve root enhancement or other acute abnormality. Electronically Signed   By: Jeannine Boga M.D.   On: 05/20/2019 23:14   CT ABDOMEN PELVIS W CONTRAST  Result Date: 05/21/2019 CLINICAL DATA:  Emesis EXAM: CT ABDOMEN AND PELVIS WITH CONTRAST TECHNIQUE: Multidetector CT imaging of the abdomen and pelvis was performed using the standard protocol following bolus administration of intravenous contrast. CONTRAST:  180mL OMNIPAQUE IOHEXOL 300 MG/ML  SOLN COMPARISON:  CT 04/10/2019 FINDINGS: Lower chest: Lung bases demonstrate no acute consolidation or effusion. Cardiac size within normal limits. Hepatobiliary: Calcified gallstones. No focal hepatic abnormality. No biliary dilatation. Possible mild wall thickening at the gastric fundus. Pancreas: Unremarkable. No pancreatic ductal dilatation or surrounding inflammatory changes. Spleen: Normal in size without focal abnormality. Adrenals/Urinary Tract: Adrenal glands are unremarkable. Kidneys are without hydronephrosis or focal lesion. Punctate stones within the bilateral kidneys. Bladder is unremarkable. Stomach/Bowel: The stomach is nonenlarged. No dilated small bowel. Negative appendix. Wall thickening and inflammatory change involving the sigmoid colon consistent with acute diverticulitis. No perforation. Vascular/Lymphatic: Moderate aortic atherosclerosis without aneurysm. Subcentimeter left iliac nodes. Reproductive: Prostate is unremarkable. Other: No free air. Small amount of fluid in the left lower quadrant Musculoskeletal: Advanced degenerative change at L3-L4 and L4-L5. IMPRESSION: 1. Findings consistent with acute sigmoid colon diverticulitis. No perforation or organized abscess. 2.  Gallstones. Possible wall thickening at the gallbladder fundus, ultrasound correlation as indicated 3. Nonobstructing kidney stone Electronically Signed   By: Donavan Foil M.D.   On: 05/21/2019 03:13   DG Chest Port 1 View  Result Date: 05/20/2019 CLINICAL DATA:  Weakness EXAM: PORTABLE CHEST 1 VIEW COMPARISON:  07/06/2005 FINDINGS: Heart and mediastinal contours are within normal limits. No focal opacities or effusions. No acute bony abnormality. IMPRESSION: No active disease. Electronically Signed   By: Rolm Baptise M.D.   On: 05/20/2019 22:46   ECHOCARDIOGRAM COMPLETE  Result Date: 05/21/2019    ECHOCARDIOGRAM REPORT   Patient Name:   Raymond [name] Date of Exam: 05/21/2019 Medical Rec #:  [id number]       Height:       70.0 in Accession #:    [id number]      Weight:       290.0 lb Date of Birth:  [birth year]       BSA:          2.444 m Patient Age:    89 years        BP:           137/86 mmHg Patient Gender: M               HR:           87 bpm. Exam Location:  Inpatient Procedure: 2D Echo, Color Doppler and Cardiac Doppler Indications:    R55 Syncope  History:        Patient has prior history of Echocardiogram examinations, most  recent 03/02/2015. Risk Factors:Hypertension.  Sonographer:    Raquel Sarna Senior RDCS Referring Phys: [place]  1. Left ventricular ejection fraction, by estimation, is 60 to 65%. The left ventricle has normal function. The left ventricle has no regional wall motion abnormalities. There is moderate left ventricular hypertrophy. Left ventricular diastolic parameters are consistent with Grade I diastolic dysfunction (impaired relaxation).  2. Right ventricular systolic function is normal. The right ventricular size is normal.  3. The mitral valve is grossly normal. Trivial mitral valve regurgitation.  4. The aortic valve is tricuspid. Aortic valve regurgitation is not visualized.  5. Aortic dilatation noted. There is mild dilatation at the level  of the sinuses of Valsalva measuring 41 mm.  6. The inferior vena cava is normal in size with greater than 50% respiratory variability, suggesting right atrial pressure of 3 mmHg. FINDINGS  Left Ventricle: Left ventricular ejection fraction, by estimation, is 60 to 65%. The left ventricle has normal function. The left ventricle has no regional wall motion abnormalities. The left ventricular internal cavity size was normal in size. There is  moderate left ventricular hypertrophy. Left ventricular diastolic parameters are consistent with Grade I diastolic dysfunction (impaired relaxation). Indeterminate filling pressures. Right Ventricle: The right ventricular size is normal. No increase in right ventricular wall thickness. Right ventricular systolic function is normal. Left Atrium: Left atrial size was normal in size. Right Atrium: Right atrial size was normal in size. Pericardium: There is no evidence of pericardial effusion. Mitral Valve: The mitral valve is grossly normal. Trivial mitral valve regurgitation. Tricuspid Valve: The tricuspid valve is grossly normal. Tricuspid valve regurgitation is trivial. Aortic Valve: The aortic valve is tricuspid. Aortic valve regurgitation is not visualized. Pulmonic Valve: The pulmonic valve was normal in structure. Pulmonic valve regurgitation is not visualized. Aorta: Aortic dilatation noted. There is mild dilatation at the level of the sinuses of Valsalva measuring 41 mm. Venous: The inferior vena cava is normal in size with greater than 50% respiratory variability, suggesting right atrial pressure of 3 mmHg. IAS/Shunts: No atrial level shunt detected by color flow Doppler.  LEFT VENTRICLE PLAX 2D LVIDd:         5.17 cm  Diastology LVIDs:         3.46 cm  LV e' lateral:   9.14 cm/s LV PW:         1.53 cm  LV E/e' lateral: 6.5 LV IVS:        1.40 cm  LV e' medial:    7.29 cm/s LVOT diam:     2.60 cm  LV E/e' medial:  8.1 LV SV:         99 LV SV Index:   40 LVOT Area:     5.31  cm  RIGHT VENTRICLE RV S prime:     19.90 cm/s TAPSE (M-mode): 2.1 cm LEFT ATRIUM             Index       RIGHT ATRIUM           Index LA diam:        3.80 cm 1.55 cm/m  RA Area:     22.00 cm LA Vol (A2C):   53.4 ml 21.85 ml/m RA Volume:   71.70 ml  29.33 ml/m LA Vol (A4C):   70.3 ml 28.76 ml/m LA Biplane Vol: 61.3 ml 25.08 ml/m  AORTIC VALVE LVOT Vmax:   114.00 cm/s LVOT Vmean:  81.500 cm/s LVOT VTI:    0.186  m  AORTA Ao Root diam: 4.10 cm Ao Asc diam:  3.70 cm MITRAL VALVE MV Area (PHT): 2.32 cm    SHUNTS MV Decel Time: 327 msec    Systemic VTI:  0.19 m MV E velocity: 59.10 cm/s  Systemic Diam: 2.60 cm MV A velocity: 69.00 cm/s MV E/A ratio:  0.86 Lyman Bishop MD Electronically signed by Lyman Bishop MD Signature Date/Time: 05/21/2019/3:02:54 PM    Final     Scheduled Meds: . enoxaparin (LOVENOX) injection  65 mg Subcutaneous Q24H  . nicotine  21 mg Transdermal Daily  . polyethylene glycol  17 g Oral Daily  . senna-docusate  2 tablet Oral BID  . sodium chloride flush  3 mL Intravenous Q12H   Continuous Infusions: . cefTRIAXone (ROCEPHIN)  IV 2 g (05/22/19 1454)  . metronidazole 500 mg (05/22/19 1158)     LOS: 2 days   Time spent: 25 minutes.  Patrecia Pour, MD [facility] [email or web address] 05/22/2019, 4:18 PM

## 2019-05-23 LAB — BASIC METABOLIC PANEL
Anion gap: 10 (ref 5–15)
BUN: 15 mg/dL (ref 6–20)
CO2: 25 mmol/L (ref 22–32)
Calcium: 8.7 mg/dL — ABNORMAL LOW (ref 8.9–10.3)
Chloride: 102 mmol/L (ref 98–111)
Creatinine, Ser: 0.91 mg/dL (ref 0.61–1.24)
GFR calc Af Amer: >60 mL/min (ref >60)
GFR calc non Af Amer: >60 mL/min (ref >60)
Glucose, Bld: 102 mg/dL — ABNORMAL HIGH (ref 70–99)
Potassium: 4 mmol/L (ref 3.5–5.1)
Sodium: 137 mmol/L (ref 135–145)

## 2019-05-23 LAB — CBC
HCT: 41.6 % (ref 39.0–52.0)
Hemoglobin: 13.2 g/dL (ref 13.0–17.0)
MCH: 28.2 pg (ref 26.0–34.0)
MCHC: 31.7 g/dL (ref 30.0–36.0)
MCV: 88.9 fL (ref 80.0–100.0)
Platelets: 188 10*3/uL (ref 150–400)
RBC: 4.68 MIL/uL (ref 4.22–5.81)
RDW: 12.8 % (ref 11.5–15.5)
WBC: 10.8 10*3/uL — ABNORMAL HIGH (ref 4.0–10.5)
nRBC: 0 % (ref 0.0–0.2)

## 2019-05-23 LAB — OLIGOCLONAL BANDS, CSF + SERM

## 2019-05-23 MED ORDER — ONDANSETRON HCL 4 MG/2ML IJ SOLN
INTRAMUSCULAR | Status: AC
  Administered 2019-05-23: 4 mg via INTRAVENOUS
  Filled 2019-05-23: qty 2

## 2019-05-23 MED ORDER — KETOROLAC TROMETHAMINE 15 MG/ML IJ SOLN
15.0000 mg | Freq: Four times a day (QID) | INTRAMUSCULAR | Status: DC | PRN
  Administered 2019-05-23 – 2019-05-24 (×3): 15 mg via INTRAVENOUS
  Filled 2019-05-23 (×3): qty 1

## 2019-05-23 NOTE — Progress Notes (Signed)
PROGRESS NOTE  VUE PAVON  AJO:[id number] DOB: 1960-02-18 DOA: 05/20/2019 PCP: Benito Mccreedy, MD   Brief Narrative: Raymond [name] Lovettis a 59 y.o.malewith medical history significant of HTN, diverticulitis in 2018, presented to the emergency room with worsening balance, difficulties with ambulation that started 3 days prior to admission.  Patient reports chronic low back pain and difficulty initiating movements, getting out of bed, getting up from a seated position for several months.  His coworkers started noticing difficulties with walking in the last few days, day prior to admission he was not able to walk while at work and hence sent to the emergency room. -In the ED he was seen by neurology, had MRI brain and MRI LS spine, which noted severe spinal stenosis and undulation of the nerve roots of the cauda equina. Subsequent neurosurgery evaluation recommends follow up after discharge for consideration of elective surgery, though his currently preserved strength does not indicate urgent intervention.   -In addition also noted worsening left lower quadrant abdominal pain for the last several days, similar to prior episode of diverticulitis in 2018. Sigmoid diverticulitis without abscess or perforation was confirmed on CT. Ceftriaxone and flagyl were started, diet restricted to clear liquids and patient continues to have significant abdominal pain.   Assessment & Plan: Active Problems:   Obesity, unspecified   TOBACCO ABUSE   HYPERTENSION, BENIGN   Abnormal EKG   Paraplegia, incomplete (HCC)   Hx of diverticulitis of colon  Severe spinal stenosis Progressive gait disorder -MRI LS-spine noted severe spinal stenosis and undulation of nerve roots of cauda equina secondary to severe spinal stenosis. Neurosurgery consulted: "While he does have significant degenerative lumbar stenosis, it appears his gait is primarily limited by other medical issues including diverticulitis.Marland KitchenMarland KitchenWe can  certainly see this patient in the outpatient clinic to discuss elective treatment of his lumbar stenosis." per Dr. Kathyrn Sheriff. - Neurology evaluated the patient as well.  - Continue PT/OT, home health recommended currently  Acute diverticulitis: Without complication on CT. This is 3rd and worst episode. Will defer surgical evaluation at this time.  - Monitor leukocytosis, improved but remains. - Clear liquids, will not advance with continued severe pain - IV analgesics, IV antiemetics prn. Required 3 doses of IV dilaudid in addition to po analgesics over past 24 hours. - Continue ceftriaxone, flagyl - Will need repeat colonoscopy following resolution.   Tobacco abuse - Cessation counseling provided.  - Nicotine patch.   Hyperglycemia, prediabetes: HbA1c 5.8%.  - PCP follow up.   Possible syncopal event few days ago Versus fall -EKG with T wave inversion in lateral leads, some of this was noted in 2017 as well. No significant arrhythmias on telemetry thus far. Echo with preserved LVEF, no severe valvular abnormalities or regional WMA.   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  - Monitor  Obesity: Estimated body mass index is 41.61 kg/m as calculated from the following:   Height as of this encounter: 5\' 10"  (1.778 m).   Weight as of this encounter: 131.5 kg.  DVT prophylaxis: Lovenox Code Status: Full Family Communication: Wife at bedside Disposition Plan:  Status is: Inpatient  Remains inpatient appropriate because:Ongoing active pain requiring inpatient pain management and Inpatient level of care appropriate due to severity of illness   Dispo: The patient is from: Home              Anticipated d/c is to: Home  Anticipated d/c date is: > 3 days              Patient currently is not medically stable to d/c.  Consultants:   Neurology  Neurosurgery  Procedures:    None  Antimicrobials:  Ceftriaxone, flagyl   Subjective: Woke up with headache which he does sometimes, is responsive to goody's powder. Lower abdominal pain is severe, constant, waxing/waning, overall improved with medications. Had BM yesterday which had no blood.  Objective: Vitals:   05/21/19 2321 05/22/19 0751 05/22/19 1644 05/22/19 2258  BP: 140/76 129/79 139/78 139/73  Pulse: 96 80 86 76  Resp:  12 11 18   Temp: 98.7 F (37.1 C) 99 F (37.2 C) 99.9 F (37.7 C) 98.7 F (37.1 C)  TempSrc:    Oral  SpO2: 94% 97% 95% 93%  Weight:      Height:       No intake or output data in the 24 hours ending 05/23/19 1044 Filed Weights   05/20/19 1319  Weight: 131.5 kg   Gen: 59 y.o. male in no distress Pulm: Nonlabored breathing room air. Clear. CV: Regular rate and rhythm. No murmur, rub, or gallop. No JVD, no dependent edema. GI: Abdomen soft, tender in LLQ without rebound or guarding, non-distended, with normoactive bowel sounds.  Ext: Warm, no deformities Skin: No rashes, lesions or ulcers on visualized skin. Neuro: Alert and oriented. No focal neurological deficits. Psych: Judgement and insight appear fair. Mood euthymic & affect congruent. Behavior is appropriate.    Data Reviewed: I have personally reviewed following labs and imaging studies  CBC: Recent Labs  Lab 05/20/19 1324 05/20/19 1941 05/21/19 0538 05/22/19 0235 05/23/19 0308  WBC 10.6*  --  13.1* 14.6* 10.8*  NEUTROABS 7.6  --  9.6*  --   --   HGB 15.0 15.3 14.5 14.0 13.2  HCT 47.0 45.0 45.4 44.3 41.6  MCV 89.0  --  89.4 88.8 88.9  PLT 236  --  207 207 361   Basic Metabolic Panel: Recent Labs  Lab 05/20/19 1324 05/20/19 1941 05/21/19 0538 05/22/19 0235 05/23/19 0308  NA 139 137 137 137 137  K 3.7 3.8 4.1 3.9 4.0  CL 102 102 101 99 102  CO2 27  --  26 28 25   GLUCOSE 115* 98 126* 107* 102*  BUN 10 13 16 14 15   CREATININE 1.19 1.00 1.20 1.13 0.91  CALCIUM 9.6  --  8.9 8.9 8.7*  MG  --   --   1.7  --   --   PHOS  --   --  3.2  --   --    GFR: Estimated Creatinine Clearance: 123.6 mL/min (by C-G formula based on SCr of 0.91 mg/dL). Liver Function Tests: Recent Labs  Lab 05/20/19 1324 05/20/19 1951 05/21/19 0538 05/22/19 0235  AST 27  --  18 13*  ALT 23  --  22 18  ALKPHOS 87  --  76 59  BILITOT 0.5  --  0.5 0.9  PROT 7.2  --  6.6 6.8  ALBUMIN 3.9 4.0 3.4* 3.2*   No results for input(s): LIPASE, AMYLASE in the last 168 hours. No results for input(s): AMMONIA in the last 168 hours. Coagulation Profile: Recent Labs  Lab 05/20/19 1324  INR 1.0   Cardiac Enzymes: Recent Labs  Lab 05/21/19 0538  CKTOTAL 244   BNP (last 3 results) No results for input(s): PROBNP in the last 8760 hours. HbA1C: Recent Labs    05/21/19  1033  HGBA1C 5.8*   CBG: No results for input(s): GLUCAP in the last 168 hours. Lipid Profile: No results for input(s): CHOL, HDL, LDLCALC, TRIG, CHOLHDL, LDLDIRECT in the last 72 hours. Thyroid Function Tests: Recent Labs    05/21/19 0538  TSH 2.398   Anemia Panel: No results for input(s): VITAMINB12, FOLATE, FERRITIN, TIBC, IRON, RETICCTPCT in the last 72 hours. Urine analysis:    Component Value Date/Time   COLORURINE YELLOW 05/20/2019 [place] 05/20/2019 1659   LABSPEC 1.016 05/20/2019 1659   PHURINE 6.0 05/20/2019 1659   GLUCOSEU NEGATIVE 05/20/2019 1659   HGBUR NEGATIVE 05/20/2019 1659   HGBUR moderate 10/17/2007 1442   BILIRUBINUR NEGATIVE 05/20/2019 [place] 05/20/2019 [place] 05/20/2019 1659   UROBILINOGEN 1.0 07/17/2013 1048   NITRITE NEGATIVE 05/20/2019 [place] 05/20/2019 1659   Recent Results (from the past 240 hour(s))  Gram stain     Status: None   Collection Time: 05/20/19  7:53 PM   Specimen: CSF; Cerebrospinal Fluid  Result Value Ref Range Status   Specimen Description CSF  Final   Special Requests NONE  Final   Gram Stain   Final     WBC PRESENT, PREDOMINANTLY MONONUCLEAR NO ORGANISMS SEEN CYTOSPIN SMEAR Performed at [facility], [place] [address], [place], [place] [address]    Report Status 05/20/2019 FINAL  Final  SARS Coronavirus 2 by RT PCR (hospital order, performed in [facility] hospital lab) Nasopharyngeal Nasopharyngeal Swab     Status: None   Collection Time: 05/20/19  8:51 PM   Specimen: Nasopharyngeal Swab  Result Value Ref Range Status   SARS Coronavirus 2 NEGATIVE NEGATIVE Final    Comment: (NOTE) SARS-CoV-2 target nucleic acids are NOT DETECTED. The SARS-CoV-2 RNA is generally detectable in upper and lower respiratory specimens during the acute phase of infection. The lowest concentration of SARS-CoV-2 viral copies this assay can detect is 250 copies / mL. A negative result does not preclude SARS-CoV-2 infection and should not be used as the sole basis for treatment or other patient management decisions.  A negative result may occur with improper specimen collection / handling, submission of specimen other than nasopharyngeal swab, presence of viral mutation(s) within the areas targeted by this assay, and inadequate number of viral copies (<250 copies / mL). A negative result must be combined with clinical observations, patient history, and epidemiological information. Fact Sheet for Patients:   StrictlyIdeas.no Fact Sheet for Healthcare Providers: BankingDealers.co.za This test is not yet approved or cleared  by the [place] FDA and has been authorized for detection and/or diagnosis of SARS-CoV-2 by FDA under an Emergency Use Authorization (EUA).  This EUA will remain in effect (meaning this test can be used) for the duration of the COVID-19 declaration under Section 564(b)(1) of the Act, 21 U.S.C. section 360bbb-3(b)(1), unless the authorization is terminated or revoked sooner. Performed at [facility], [place] [address]  [place], [place] [address]       Radiology Studies: ECHOCARDIOGRAM COMPLETE  Result Date: 05/21/2019    ECHOCARDIOGRAM REPORT   Patient Name:   Raymond [name] Date of Exam: 05/21/2019 Medical Rec #:  [id number]       Height:       70.0 in Accession #:    [id number]      Weight:       290.0 lb Date of Birth:  1960-12-09  BSA:          2.444 m Patient Age:    4 years        BP:           137/86 mmHg Patient Gender: M               HR:           87 bpm. Exam Location:  Inpatient Procedure: 2D Echo, Color Doppler and Cardiac Doppler Indications:    R55 Syncope  History:        Patient has prior history of Echocardiogram examinations, most                 recent 03/02/2015. Risk Factors:Hypertension.  Sonographer:    Raquel Sarna Senior RDCS Referring Phys: [place]  1. Left ventricular ejection fraction, by estimation, is 60 to 65%. The left ventricle has normal function. The left ventricle has no regional wall motion abnormalities. There is moderate left ventricular hypertrophy. Left ventricular diastolic parameters are consistent with Grade I diastolic dysfunction (impaired relaxation).  2. Right ventricular systolic function is normal. The right ventricular size is normal.  3. The mitral valve is grossly normal. Trivial mitral valve regurgitation.  4. The aortic valve is tricuspid. Aortic valve regurgitation is not visualized.  5. Aortic dilatation noted. There is mild dilatation at the level of the sinuses of Valsalva measuring 41 mm.  6. The inferior vena cava is normal in size with greater than 50% respiratory variability, suggesting right atrial pressure of 3 mmHg. FINDINGS  Left Ventricle: Left ventricular ejection fraction, by estimation, is 60 to 65%. The left ventricle has normal function. The left ventricle has no regional wall motion abnormalities. The left ventricular internal cavity size was normal in size. There is  moderate left ventricular hypertrophy. Left ventricular  diastolic parameters are consistent with Grade I diastolic dysfunction (impaired relaxation). Indeterminate filling pressures. Right Ventricle: The right ventricular size is normal. No increase in right ventricular wall thickness. Right ventricular systolic function is normal. Left Atrium: Left atrial size was normal in size. Right Atrium: Right atrial size was normal in size. Pericardium: There is no evidence of pericardial effusion. Mitral Valve: The mitral valve is grossly normal. Trivial mitral valve regurgitation. Tricuspid Valve: The tricuspid valve is grossly normal. Tricuspid valve regurgitation is trivial. Aortic Valve: The aortic valve is tricuspid. Aortic valve regurgitation is not visualized. Pulmonic Valve: The pulmonic valve was normal in structure. Pulmonic valve regurgitation is not visualized. Aorta: Aortic dilatation noted. There is mild dilatation at the level of the sinuses of Valsalva measuring 41 mm. Venous: The inferior vena cava is normal in size with greater than 50% respiratory variability, suggesting right atrial pressure of 3 mmHg. IAS/Shunts: No atrial level shunt detected by color flow Doppler.  LEFT VENTRICLE PLAX 2D LVIDd:         5.17 cm  Diastology LVIDs:         3.46 cm  LV e' lateral:   9.14 cm/s LV PW:         1.53 cm  LV E/e' lateral: 6.5 LV IVS:        1.40 cm  LV e' medial:    7.29 cm/s LVOT diam:     2.60 cm  LV E/e' medial:  8.1 LV SV:         99 LV SV Index:   40 LVOT Area:     5.31 cm  RIGHT VENTRICLE RV S prime:  19.90 cm/s TAPSE (M-mode): 2.1 cm LEFT ATRIUM             Index       RIGHT ATRIUM           Index LA diam:        3.80 cm 1.55 cm/m  RA Area:     22.00 cm LA Vol (A2C):   53.4 ml 21.85 ml/m RA Volume:   71.70 ml  29.33 ml/m LA Vol (A4C):   70.3 ml 28.76 ml/m LA Biplane Vol: 61.3 ml 25.08 ml/m  AORTIC VALVE LVOT Vmax:   114.00 cm/s LVOT Vmean:  81.500 cm/s LVOT VTI:    0.186 m  AORTA Ao Root diam: 4.10 cm Ao Asc diam:  3.70 cm MITRAL VALVE MV Area  (PHT): 2.32 cm    SHUNTS MV Decel Time: 327 msec    Systemic VTI:  0.19 m MV E velocity: 59.10 cm/s  Systemic Diam: 2.60 cm MV A velocity: 69.00 cm/s MV E/A ratio:  0.86 Lyman Bishop MD Electronically signed by Lyman Bishop MD Signature Date/Time: 05/21/2019/3:02:54 PM    Final     Scheduled Meds:  enoxaparin (LOVENOX) injection  65 mg Subcutaneous Q24H   nicotine  21 mg Transdermal Daily   polyethylene glycol  17 g Oral Daily   senna-docusate  2 tablet Oral BID   sodium chloride flush  3 mL Intravenous Q12H   Continuous Infusions:  cefTRIAXone (ROCEPHIN)  IV 2 g (05/22/19 1454)   metronidazole 500 mg (05/23/19 0433)     LOS: 3 days   Time spent: 25 minutes.  Patrecia Pour, MD [facility] [email or web address] 05/23/2019, 10:44 AM

## 2019-05-23 NOTE — Care Management Important Message (Signed)
Important Message  Patient Details  Name: Raymond [name] MRN: [id number] Date of Birth: 1960/07/31   Medicare Important Message Given:  Yes     [name] 05/23/2019, 1:59 PM

## 2019-05-23 NOTE — Progress Notes (Signed)
Physical Therapy Treatment Patient Details Name: Raymond [name] MRN: [id number] DOB: Aug 19, 1960 Today's Date: 05/23/2019    History of Present Illness Raymond [name] is an 59 y.o. male who presents to the ED today after an episode at work of transient alteration in awareness in conjunction with acute onset of bilateral lower extremity weakness that has persisted.  He arrived from work via EMS with a chief complaint of loss of balance and  coordination with difficulty ambulating.  MRI of head and spine were negative for acute changes.  Lumbar Spine MRI did show stenosis and per neurosurgery may benefit from non-emergent decompression but ok to participate with PT/OT.  Additionally, pt has had lumbar puncture.  Pt developed severe abdominal pain that was similar to flairs of diverticulitiis and is having that worked up.    PT Comments    Patient mobilizing well this session without LOB or assist needed.  Even able to negotiate steps with S and educated on patterns for safety as well as continuing to keep legs strong.  Patient today reports legs do not feel as heavy and he feels he is down to his baseline in regards to stomach pain.  Headache is really what is bothering him.  Noted BP prior to ambulation 140/83, HR 74 and RN made aware.  Feel no further skilled [name] necessary at this time.   PT to sign off.  May need referral following neurosurgical work up as an outpatient.   Follow Up Recommendations  No PT follow up     Equipment Recommendations  None recommended by PT    Recommendations for Other Services       Precautions / Restrictions Precautions Precautions: Fall Restrictions Weight Bearing Restrictions: No    Mobility  Bed Mobility Overal bed mobility: Modified Independent             General bed mobility comments: up in bathroom upon my entry, able to get to bed on his own  Transfers Overall transfer level: Modified independent Equipment used: None              General transfer comment: up from EOB after sitting to adjust equipment after toileting unaided  Ambulation/Gait Ambulation/Gait assistance: Independent Gait Distance (Feet): 250 Feet Assistive device: None Gait Pattern/deviations: Step-through pattern;WFL(Within Functional Limits)         Stairs Stairs: Yes Stairs assistance: Supervision Stair Management: Step to pattern;Alternating pattern;Two rails;One rail Right;One rail Left Number of Stairs: 10 General stair comments: up steps with step through pattern with two rails, descending cues to try one rail and preferred step to pattern initially leading down with L first and noted uncontrolled descent with R first improved control   Wheelchair Mobility    Modified Rankin (Stroke Patients Only)       Balance Overall balance assessment: Needs assistance Sitting-balance support: Feet supported;No upper extremity supported Sitting balance-Leahy Scale: Good     Standing balance support: No upper extremity supported Standing balance-Leahy Scale: Good                              Cognition Arousal/Alertness: Awake/alert Behavior During Therapy: WFL for tasks assessed/performed Overall Cognitive Status: Within Functional Limits for tasks assessed                                        Exercises  General Comments        Pertinent Vitals/Pain Pain Assessment: Faces Pain Score: 5  Faces Pain Scale: Hurts whole lot Pain Location: Stomach and headache Pain Descriptors / Indicators: Aching Pain Intervention(s): Monitored during session;Repositioned    Home Living                      Prior Function            PT Goals (current goals can now be found in the care plan section) Acute Rehab PT Goals Patient Stated Goal: to go home Progress towards PT goals: Progressing toward goals;Goals met/education completed, patient discharged from PT    Frequency    Min  3X/week      PT Plan Current plan remains appropriate    Co-evaluation              AM-PAC PT "6 Clicks" Mobility   Outcome Measure  Help needed turning from your back to your side while in a flat bed without using bedrails?: None Help needed moving from lying on your back to sitting on the side of a flat bed without using bedrails?: None Help needed moving to and from a bed to a chair (including a wheelchair)?: None Help needed standing up from a chair using your arms (e.g., wheelchair or bedside chair)?: None Help needed to walk in hospital room?: None Help needed climbing 3-5 steps with a railing? : None 6 Click Score: 24    End of Session   Activity Tolerance: Patient tolerated treatment well Patient left: in bed;with call bell/phone within reach;with family/visitor present   PT Visit Diagnosis: Other abnormalities of gait and mobility (R26.89);Muscle weakness (generalized) (M62.81)     Time: 4360-6770 PT Time Calculation (min) (ACUTE ONLY): 25 min  Charges:  $Gait Training: 8-22 mins $Self Care/Home Management: [place] [phone] 05/23/2019    Reginia Naas 05/23/2019, 1:33 PM

## 2019-05-23 NOTE — Progress Notes (Signed)
Occupational Therapy Treatment Patient Details Name: Raymond [name] MRN: [id number] DOB: 01-19-1960 Today's Date: 05/23/2019    History of present illness Raymond [name] is an 59 y.o. male who presents to the ED today after an episode at work of transient alteration in awareness in conjunction with acute onset of bilateral lower extremity weakness that has persisted.  He arrived from work via EMS with a chief complaint of loss of balance and  coordination with difficulty ambulating.  MRI of head and spine were negative for acute changes.  Lumbar Spine MRI did show stenosis and per neurosurgery may benefit from non-emergent decompression but ok to participate with PT/OT.  Additionally, pt has had lumbar puncture.  Pt developed severe abdominal pain that was similar to flairs of diverticulitiis and is having that worked up.   OT comments  Patient continues to make steady progress towards goals in skilled OT session. Patient's session encompassed education and practice with adaptive equipment. Pt receptive and participatory in using adaptive equipment, and therapist provided min cues for accuracy. Pt and wife educated on where to purchase items with verbal understanding noted. Pt limited due to abdominal pain and L sided headache increased in severity as session progressed. Pt provided cool washcloth and minimally repositioned on EOB with all needs met. Encouraged continued movement out of bed for meals and to toilet once headache subsides. Discharge remains appropriate at this time; will continue to follow acutely.    Follow Up Recommendations  Home health OT    Equipment Recommendations  Toilet riser;Other (comment)(Hip kit)    Recommendations for Other Services      Precautions / Restrictions Precautions Precautions: Fall Restrictions Weight Bearing Restrictions: No       Mobility Bed Mobility               General bed mobility comments: Recieved pt on EOB  Transfers                       Balance Overall balance assessment: Needs assistance Sitting-balance support: Feet supported;No upper extremity supported Sitting balance-Leahy Scale: Good                                     ADL either performed or assessed with clinical judgement   ADL Overall ADL's : Needs assistance/impaired                                     Functional mobility during ADLs: Min guard General ADL Comments: Focused session on practicing with A/E equipment     Vision       Perception     Praxis      Cognition Arousal/Alertness: Awake/alert Behavior During Therapy: WFL for tasks assessed/performed Overall Cognitive Status: Within Functional Limits for tasks assessed                                          Exercises     Shoulder Instructions       General Comments      Pertinent Vitals/ Pain       Pain Assessment: Faces Faces Pain Scale: Hurts whole lot Pain Location: Stomach and headache Pain Descriptors / Indicators: Discomfort;Grimacing;Aching;Guarding Pain Intervention(s): Limited activity  within patient's tolerance;Monitored during session;Repositioned  Home Living                                          Prior Functioning/Environment              Frequency           Progress Toward Goals  OT Goals(current goals can now be found in the care plan section)  Progress towards OT goals: Progressing toward goals  Acute Rehab OT Goals Patient Stated Goal: to go home OT Goal Formulation: With patient Time For Goal Achievement: 06/04/19 Potential to Achieve Goals: Good  Plan Discharge plan remains appropriate    Co-evaluation                 AM-PAC OT "6 Clicks" Daily Activity     Outcome Measure   Help from another person eating meals?: None Help from another person taking care of personal grooming?: A Little Help from another person toileting, which  includes using toliet, bedpan, or urinal?: A Little Help from another person bathing (including washing, rinsing, drying)?: A Lot Help from another person to put on and taking off regular upper body clothing?: None Help from another person to put on and taking off regular lower body clothing?: A Lot 6 Click Score: 18    End of Session    OT Visit Diagnosis: Unsteadiness on feet (R26.81);Other abnormalities of gait and mobility (R26.89);Muscle weakness (generalized) (M62.81);Pain Pain - Right/Left: Left Pain - part of body: (Headache on L side)   Activity Tolerance Patient limited by pain   Patient Left in bed;with call bell/phone within reach   Nurse Communication Mobility status        Time: 0488-8916 OT Time Calculation (min): 15 min  Charges: OT General Charges $OT Visit: 1 Visit OT Treatments $Self Care/Home Management : 8-22 mins  Corinne Ports E. Wayne Brunker, COTA/L Acute Rehabilitation Services [place] 05/23/2019, 10:07 AM

## 2019-05-24 LAB — BASIC METABOLIC PANEL
Anion gap: 7 (ref 5–15)
BUN: 21 mg/dL — ABNORMAL HIGH (ref 6–20)
CO2: 28 mmol/L (ref 22–32)
Calcium: 8.7 mg/dL — ABNORMAL LOW (ref 8.9–10.3)
Chloride: 104 mmol/L (ref 98–111)
Creatinine, Ser: 0.94 mg/dL (ref 0.61–1.24)
GFR calc Af Amer: >60 mL/min (ref >60)
GFR calc non Af Amer: >60 mL/min (ref >60)
Glucose, Bld: 96 mg/dL (ref 70–99)
Potassium: 3.8 mmol/L (ref 3.5–5.1)
Sodium: 139 mmol/L (ref 135–145)

## 2019-05-24 LAB — CBC
HCT: 39.7 % (ref 39.0–52.0)
Hemoglobin: 12.6 g/dL — ABNORMAL LOW (ref 13.0–17.0)
MCH: 27.9 pg (ref 26.0–34.0)
MCHC: 31.7 g/dL (ref 30.0–36.0)
MCV: 88 fL (ref 80.0–100.0)
Platelets: 198 10*3/uL (ref 150–400)
RBC: 4.51 MIL/uL (ref 4.22–5.81)
RDW: 12.5 % (ref 11.5–15.5)
WBC: 9.7 10*3/uL (ref 4.0–10.5)
nRBC: 0 % (ref 0.0–0.2)

## 2019-05-24 NOTE — Progress Notes (Signed)
PROGRESS NOTE  Raymond [name]  TTS:[id number] DOB: 1960/05/31 DOA: 05/20/2019 PCP: Benito Mccreedy, MD   Brief Narrative: Raymond [name] a 59 y.o.malewith medical history significant of HTN, diverticulitis in 2018, presented to the emergency room with worsening balance, difficulties with ambulation that started 3 days prior to admission.  Patient reports chronic low back pain and difficulty initiating movements, getting out of bed, getting up from a seated position for several months.  His coworkers started noticing difficulties with walking in the last few days, day prior to admission he was not able to walk while at work and hence sent to the emergency room. -In the ED he was seen by neurology, had MRI brain and MRI LS spine, which noted severe spinal stenosis and undulation of the nerve roots of the cauda equina. Subsequent neurosurgery evaluation recommends follow up after discharge for consideration of elective surgery, though his currently preserved strength does not indicate urgent intervention.   -In addition also noted worsening left lower quadrant abdominal pain for the last several days, similar to prior episode of diverticulitis in 2018. Sigmoid diverticulitis without abscess or perforation was confirmed on CT. Ceftriaxone and flagyl were started, diet restricted to clear liquids and patient continues to have significant abdominal pain.   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 Neurosurgery consulted: "While he does have significant degenerative lumbar stenosis, it appears his gait is primarily limited by other medical issues including diverticulitis.Marland KitchenMarland KitchenWe can  certainly see this patient in the outpatient clinic to discuss elective treatment of his lumbar stenosis." per Dr. Kathyrn Sheriff. - Neurology evaluated the patient as well.  - Continue PT/OT, home health recommended currently  Acute diverticulitis: Without complication on CT.  - WBC normalized, pain improved, tolerating clear liquids, will advance diet. If remains able to tolerate pain without IV narcotics and taking po reliably, can transition to po abx in the next 24 hrs in preparation for DC 5/22. - Continue ceftriaxone, flagyl - Will need repeat colonoscopy following resolution.   Tobacco abuse - Cessation counseling provided.  - Nicotine patch.   Hyperglycemia, prediabetes: HbA1c 5.8%.  - PCP follow up.   Possible syncopal event few days ago Versus fall - EKG with T wave inversion in lateral leads, some of this was noted in 2017 as well. No significant arrhythmias on telemetry thus far. Echo with preserved LVEF, no severe valvular abnormalities or regional WMA.   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  - Monitor  Obesity: Estimated body mass index is 40.49 kg/m as calculated from the following:   Height as of this encounter: 5\' 10"  (1.778 m).   Weight as of this encounter: 128 kg.  DVT prophylaxis: Lovenox Code Status: Full Family Communication: Wife at bedside Disposition Plan:  Status is: Inpatient  Remains inpatient appropriate because:Ongoing active pain requiring inpatient pain management and Inpatient level of care appropriate due to severity of illness   Dispo: The patient is from: Home              Anticipated d/c is to: Home  Anticipated d/c date is: 1 day              Patient currently is not medically stable to d/c.  Consultants:   Neurology  Neurosurgery  Procedures:   None  Antimicrobials:  Ceftriaxone, flagyl   Subjective: Headache improved, abdominal pain very  minimal today, wants to advance diet. Has been gaining strength and mobility, getting up to bathroom on his own. Feeling fecal urgency without bleeding, etc.  Objective: Vitals:   05/23/19 1600 05/23/19 1900 05/23/19 2204 05/23/19 2300  BP:   (!) 138/59   Pulse:   68   Resp: 14  20   Temp:   98.6 F (37 C)   TempSrc:   Oral   SpO2:   99% 98%  Weight:  128 kg    Height:        Intake/Output Summary (Last 24 hours) at 05/24/2019 0846 Last data filed at 05/24/2019 0458 Gross per 24 hour  Intake 320 ml  Output --  Net 320 ml   Filed Weights   05/20/19 1319 05/23/19 1900  Weight: 131.5 kg 128 kg   Gen: 59 y.o. male in no distress Pulm: Nonlabored breathing room air. Clear. CV: Regular rate and rhythm. No murmur, rub, or gallop. No JVD, no dependent edema. GI: Abdomen soft, very minimal tenderness in LLQ, improved, without guarding or rebound, non-distended, with normoactive bowel sounds.  Ext: Warm, no deformities Skin: No rashes, lesions or ulcers on visualized skin. Neuro: Alert and oriented. Strength and sensation intact in BLE's, no new focal neurological deficits. Psych: Judgement and insight appear fair. Mood euthymic & affect congruent. Behavior is appropriate.    Data Reviewed: I have personally reviewed following labs and imaging studies  CBC: Recent Labs  Lab 05/20/19 1324 05/20/19 1324 05/20/19 1941 05/21/19 0538 05/22/19 0235 05/23/19 0308 05/24/19 0230  WBC 10.6*  --   --  13.1* 14.6* 10.8* 9.7  NEUTROABS 7.6  --   --  9.6*  --   --   --   HGB 15.0   < > 15.3 14.5 14.0 13.2 12.6*  HCT 47.0   < > 45.0 45.4 44.3 41.6 39.7  MCV 89.0  --   --  89.4 88.8 88.9 88.0  PLT 236  --   --  207 207 188 198   < > = values in this interval not displayed.   Basic Metabolic Panel: Recent Labs  Lab 05/20/19 1324 05/20/19 1324 05/20/19 1941 05/21/19 0538 05/22/19 0235 05/23/19 0308 05/24/19 0230  NA 139   < > 137 137 137 137 139  K 3.7   < > 3.8 4.1 3.9 4.0 3.8   CL 102   < > 102 101 99 102 104  CO2 27  --   --  26 28 25 28   GLUCOSE 115*   < > 98 126* 107* 102* 96  BUN 10   < > 13 16 14 15  21*  CREATININE 1.19   < > 1.00 1.20 1.13 0.91 0.94  CALCIUM 9.6  --   --  8.9 8.9 8.7* 8.7*  MG  --   --   --  1.7  --   --   --   PHOS  --   --   --  3.2  --   --   --    < > = values in this interval not displayed.   GFR: Estimated Creatinine Clearance: 117.9 mL/min (by C-G formula based on SCr of  0.94 mg/dL). Liver Function Tests: Recent Labs  Lab 05/20/19 1324 05/20/19 1951 05/21/19 0538 05/22/19 0235  AST 27  --  18 13*  ALT 23  --  22 18  ALKPHOS 87  --  76 59  BILITOT 0.5  --  0.5 0.9  PROT 7.2  --  6.6 6.8  ALBUMIN 3.9 4.0 3.4* 3.2*   No results for input(s): LIPASE, AMYLASE in the last 168 hours. No results for input(s): AMMONIA in the last 168 hours. Coagulation Profile: Recent Labs  Lab 05/20/19 1324  INR 1.0   Cardiac Enzymes: Recent Labs  Lab 05/21/19 0538  CKTOTAL 244   BNP (last 3 results) No results for input(s): PROBNP in the last 8760 hours. HbA1C: Recent Labs    05/21/19 1033  HGBA1C 5.8*   CBG: No results for input(s): GLUCAP in the last 168 hours. Lipid Profile: No results for input(s): CHOL, HDL, LDLCALC, TRIG, CHOLHDL, LDLDIRECT in the last 72 hours. Thyroid Function Tests: No results for input(s): TSH, T4TOTAL, FREET4, T3FREE, THYROIDAB in the last 72 hours. Anemia Panel: No results for input(s): VITAMINB12, FOLATE, FERRITIN, TIBC, IRON, RETICCTPCT in the last 72 hours. Urine analysis:    Component Value Date/Time   COLORURINE YELLOW 05/20/2019 [place] 05/20/2019 1659   LABSPEC 1.016 05/20/2019 1659   PHURINE 6.0 05/20/2019 1659   GLUCOSEU NEGATIVE 05/20/2019 1659   HGBUR NEGATIVE 05/20/2019 1659   HGBUR moderate 10/17/2007 1442   BILIRUBINUR NEGATIVE 05/20/2019 [place] 05/20/2019 [place] 05/20/2019 1659   UROBILINOGEN 1.0 07/17/2013 1048    NITRITE NEGATIVE 05/20/2019 [place] 05/20/2019 1659   Recent Results (from the past 240 hour(s))  Gram stain     Status: None   Collection Time: 05/20/19  7:53 PM   Specimen: CSF; Cerebrospinal Fluid  Result Value Ref Range Status   Specimen Description CSF  Final   Special Requests NONE  Final   Gram Stain   Final    WBC PRESENT, PREDOMINANTLY MONONUCLEAR NO ORGANISMS SEEN CYTOSPIN SMEAR Performed at [facility], [place] [address] [place], [place] [address]    Report Status 05/20/2019 FINAL  Final  SARS Coronavirus 2 by RT PCR (hospital order, performed in [facility] hospital lab) Nasopharyngeal Nasopharyngeal Swab     Status: None   Collection Time: 05/20/19  8:51 PM   Specimen: Nasopharyngeal Swab  Result Value Ref Range Status   SARS Coronavirus 2 NEGATIVE NEGATIVE Final    Comment: (NOTE) SARS-CoV-2 target nucleic acids are NOT DETECTED. The SARS-CoV-2 RNA is generally detectable in upper and lower respiratory specimens during the acute phase of infection. The lowest concentration of SARS-CoV-2 viral copies this assay can detect is 250 copies / mL. A negative result does not preclude SARS-CoV-2 infection and should not be used as the sole basis for treatment or other patient management decisions.  A negative result may occur with improper specimen collection / handling, submission of specimen other than nasopharyngeal swab, presence of viral mutation(s) within the areas targeted by this assay, and inadequate number of viral copies (<250 copies / mL). A negative result must be combined with clinical observations, patient history, and epidemiological information. Fact Sheet for Patients:   StrictlyIdeas.no Fact Sheet for Healthcare Providers: BankingDealers.co.za This test is not yet approved or cleared  by the [place] FDA and has been authorized for detection and/or diagnosis of SARS-CoV-2  by FDA under an Emergency Use  Authorization (EUA).  This EUA will remain in effect (meaning this test can be used) for the duration of the COVID-19 declaration under Section 564(b)(1) of the Act, 21 U.S.C. section 360bbb-3(b)(1), unless the authorization is terminated or revoked sooner. Performed at [facility], [place] [address] [place], [place] [address]       Radiology Studies: No results found.  Scheduled Meds: . enoxaparin (LOVENOX) injection  65 mg Subcutaneous Q24H  . nicotine  21 mg Transdermal Daily  . polyethylene glycol  17 g Oral Daily  . senna-docusate  2 tablet Oral BID  . sodium chloride flush  3 mL Intravenous Q12H   Continuous Infusions: . cefTRIAXone (ROCEPHIN)  IV 2 g (05/23/19 1100)  . metronidazole 500 mg (05/24/19 0458)     LOS: 4 days   Time spent: 25 minutes.  Patrecia Pour, MD [facility] [email or web address] 05/24/2019, 8:46 AM

## 2019-05-25 LAB — CBC
HCT: 39.5 % (ref 39.0–52.0)
Hemoglobin: 12.5 g/dL — ABNORMAL LOW (ref 13.0–17.0)
MCH: 28 pg (ref 26.0–34.0)
MCHC: 31.6 g/dL (ref 30.0–36.0)
MCV: 88.6 fL (ref 80.0–100.0)
Platelets: 250 10*3/uL (ref 150–400)
RBC: 4.46 MIL/uL (ref 4.22–5.81)
RDW: 12.2 % (ref 11.5–15.5)
WBC: 8.3 10*3/uL (ref 4.0–10.5)
nRBC: 0 % (ref 0.0–0.2)

## 2019-05-25 LAB — BASIC METABOLIC PANEL
Anion gap: 7 (ref 5–15)
BUN: 18 mg/dL (ref 6–20)
CO2: 28 mmol/L (ref 22–32)
Calcium: 8.6 mg/dL — ABNORMAL LOW (ref 8.9–10.3)
Chloride: 105 mmol/L (ref 98–111)
Creatinine, Ser: 0.98 mg/dL (ref 0.61–1.24)
GFR calc Af Amer: >60 mL/min (ref >60)
GFR calc non Af Amer: >60 mL/min (ref >60)
Glucose, Bld: 96 mg/dL (ref 70–99)
Potassium: 4.1 mmol/L (ref 3.5–5.1)
Sodium: 140 mmol/L (ref 135–145)

## 2019-05-25 MED ORDER — ONDANSETRON HCL 4 MG PO TABS: 4.0000 mg | ORAL_TABLET | Freq: Four times a day (QID) | ORAL | 0 refills | Status: DC | PRN

## 2019-05-25 MED ORDER — AMOXICILLIN-POT CLAVULANATE 875-125 MG PO TABS
1.0000 | ORAL_TABLET | Freq: Two times a day (BID) | ORAL | 0 refills | Status: DC
Start: 2019-05-25 — End: 2020-09-01

## 2019-05-25 NOTE — TOC Transition Note (Signed)
Transition of Care [facility]) - CM/SW Discharge Note   Patient Details  Name: Raymond [name] MRN: [id number] Date of Birth: 1960/03/21  Transition of Care [facility]) CM/SW Contact:  Carles Collet, RN Phone Number: 05/25/2019, 12:17 PM   Clinical Narrative:   [name] w patient over the phone. He would like home PT OT services. Discussed ratings, chose [facility]. Referral accepted by [facility]. No other CM needs identified.    Final next level of care: [place] Barriers to Discharge: No Barriers Identified   Patient Goals and CMS Choice Patient states their goals for this hospitalization and ongoing recovery are:: to go home CMS Medicare.gov Compare Post Acute Care list provided to:: Patient Choice offered to / list presented to : Patient  Discharge Placement                       Discharge Plan and Services                          HH Arranged: PT, OT [facility] Agency: [place] Date [facility] Agency Contacted: 05/25/19 Time [place]: 1217 Representative spoke with at [place]: [place] ([place]) Interventions     Readmission Risk Interventions No flowsheet data found.

## 2019-05-25 NOTE — Discharge Instructions (Signed)
Diverticulitis  Diverticulitis is infection or inflammation of small pouches (diverticula) in the colon that form due to a condition called diverticulosis. Diverticula can trap stool (feces) and bacteria, causing infection and inflammation. Diverticulitis may cause severe stomach pain and diarrhea. It may lead to tissue damage in the colon that causes bleeding. The diverticula may also burst (rupture) and cause infected stool to enter other areas of the abdomen. Complications of diverticulitis can include:  Bleeding.  Severe infection.  Severe pain.  Rupture (perforation) of the colon.  Blockage (obstruction) of the colon. What are the causes? This condition is caused by stool becoming trapped in the diverticula, which allows bacteria to grow in the diverticula. This leads to inflammation and infection. What increases the risk? You are more likely to develop this condition if:  You have diverticulosis. The risk for diverticulosis increases if: ? You are overweight or obese. ? You use tobacco products. ? You do not get enough exercise.  You eat a diet that does not include enough fiber. High-fiber foods include fruits, vegetables, beans, nuts, and whole grains. What are the signs or symptoms? Symptoms of this condition may include:  Pain and tenderness in the abdomen. The pain is normally located on the left side of the abdomen, but it may occur in other areas.  Fever and chills.  Bloating.  Cramping.  Nausea.  Vomiting.  Changes in bowel routines.  Blood in your stool. How is this diagnosed? This condition is diagnosed based on:  Your medical history.  A physical exam.  Tests to make sure there is nothing else causing your condition. These tests may include: ? Blood tests. ? Urine tests. ? Imaging tests of the abdomen, including X-rays, ultrasounds, MRIs, or CT scans. How is this treated? Most cases of this condition are mild and can be treated at home.  Treatment may include:  Taking over-the-counter pain medicines.  Following a clear liquid diet.  Taking antibiotic medicines by mouth.  Rest. More severe cases may need to be treated at a hospital. Treatment may include:  Not eating or drinking.  Taking prescription pain medicine.  Receiving antibiotic medicines through an IV tube.  Receiving fluids and nutrition through an IV tube.  Surgery. When your condition is under control, your health care provider may recommend that you have a colonoscopy. This is an exam to look at the entire large intestine. During the exam, a lubricated, bendable tube is inserted into the anus and then passed into the rectum, colon, and other parts of the large intestine. A colonoscopy can show how severe your diverticula are and whether something else may be causing your symptoms. Follow these instructions at home: Medicines  Take over-the-counter and prescription medicines only as told by your health care provider. These include fiber supplements, probiotics, and stool softeners.  If you were prescribed an antibiotic medicine, take it as told by your health care provider. Do not stop taking the antibiotic even if you start to feel better.  Do not drive or use heavy machinery while taking prescription pain medicine. General instructions   Follow a full liquid diet or another diet as directed by your health care provider. After your symptoms improve, your health care provider may tell you to change your diet. He or she may recommend that you eat a diet that contains at least 25 g (25 grams) of fiber daily. Fiber makes it easier to pass stool. Healthy sources of fiber include: ? Berries. One cup contains 4-8 grams of   fiber. ? Beans or lentils. One half cup contains 5-8 grams of fiber. ? Green vegetables. One cup contains 4 grams of fiber.  Exercise for at least 30 minutes, 3 times each week. You should exercise hard enough to raise your heart rate and  break a sweat.  Keep all follow-up visits as told by your health care provider. This is important. You may need a colonoscopy. Contact a health care provider if:  Your pain does not improve.  You have a hard time drinking or eating food.  Your bowel movements do not return to normal. Get help right away if:  Your pain gets worse.  Your symptoms do not get better with treatment.  Your symptoms suddenly get worse.  You have a fever.  You vomit more than one time.  You have stools that are bloody, black, or tarry. Summary  Diverticulitis is infection or inflammation of small pouches (diverticula) in the colon that form due to a condition called diverticulosis. Diverticula can trap stool (feces) and bacteria, causing infection and inflammation.  You are at higher risk for this condition if you have diverticulosis and you eat a diet that does not include enough fiber.  Most cases of this condition are mild and can be treated at home. More severe cases may need to be treated at a hospital.  When your condition is under control, your health care provider may recommend that you have an exam called a colonoscopy. This exam can show how severe your diverticula are and whether something else may be causing your symptoms. This information is not intended to replace advice given to you by your health care provider. Make sure you discuss any questions you have with your health care provider. Document Revised: 12/02/2016 Document Reviewed: 01/23/2016 [employer] Patient Education  2020 [employer]  

## 2019-05-25 NOTE — Discharge Summary (Signed)
Physician Discharge Summary  DUSTAN HYAMS CHE:[id number] DOB: [birth year] DOA: 05/20/2019  PCP: Benito Mccreedy, MD  Admit date: 05/20/2019 Discharge date: 05/25/2019  Admitted From: Home Disposition: Home   Recommendations for Outpatient Follow-up:  1. Follow up with PCP in 1-2 weeks 2. Follow up with [facility] in 1 months for repeat colonoscopy 3. Follow up with neurosurgery for elective surgery for severe lumbar spinal stenosis. 4. Smoking cessation counseling  Home Health: OT, PT Equipment/Devices: None Discharge Condition: Stable CODE STATUS: Full Diet recommendation: Heart healthy  Brief/Interim Summary: Garrett Mitchum Lovettis a 59 y.o.malewith medical history significant of HTN, diverticulitis in2018,presented to the emergency room with worsening balance, difficulties with ambulation that started 3 days prior to admission.Patient reports chronic low back pain and difficulty initiating movements, getting out of bed, getting up from a seated position for several months.His coworkers started noticing difficulties with walking in the last few days, day prior to admission he was not able to walk while at work and hence sent to the emergency room.  -In the ED he was seen by neurology, had MRI brain and MRI LS spine,which noted severe spinal stenosis and undulation of the nerve roots of the cauda equina. Subsequent neurosurgery evaluation recommends follow up after discharge for consideration of elective surgery, though his currently preserved strength does not indicate urgent intervention.   -In addition also noted worsening left lower quadrant abdominal pain for the last several days, similar to prior episode of diverticulitis in 2018. Sigmoid diverticulitis without abscess or perforation was confirmed on CT. Ceftriaxone and flagyl were started, diet restricted to clear liquids and patient showed slow improvement. Diet tolerated as it was advanced, leukocytosis resolved.  Tolerating oral medications and stable for discharge.   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 Neurosurgery consulted: "While he does have significant degenerative lumbar stenosis, it appears his gait is primarily limited by other medical issues including diverticulitis.Marland KitchenMarland KitchenWe can certainly see this patient in the outpatient clinic to discuss elective treatment of his lumbar stenosis." per Dr. Kathyrn Sheriff. - Neurology evaluated the patient as well.  - Continue PT/OT, home health recommended currently  Acute diverticulitis: Without complication on CT. WBC normalized, pain improved, tolerating soft diet and po medications.  - Will transition to augmentin to complete 7 days, po antiemetic as well. No pain currently. - Will need repeat colonoscopy following resolution.   Tobacco abuse - Cessation counseling provided.   Hyperglycemia, prediabetes: HbA1c 5.8%.  - PCP follow up.   Possible syncopal event few days ago Versus fall - EKG with T wave inversion in lateral leads, some of this was noted in 2017 as well. No significant arrhythmias on telemetry.  Echo with preserved LVEF, no severe valvular abnormalities or regional WMA. Consider cardiology evaluation as outpatient.  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  - Monitor  Obesity: Estimated body mass index is 40.49 kg/m as calculated from the following:   Height as of this encounter: 5\' 10"  (1.778 m).   Weight as of this encounter: 128 kg.  Discharge Instructions Discharge Instructions    Discharge  instructions  Complete by: As directed    You were treated for diverticulitis and have improved. You are stable for discharge since you are able to eat and drink, take oral medications.  - Follow up with your PCP in the next 1-2 weeks - Continue taking antibiotics, augmentin was sent to your pharmacy to be taken for 2 more days - Follow up with [facility] to get a colonoscopy after this bout of diverticulitis, about 1 month from now.  - You may take zofran as needed for nausea, though if your symptoms return seek medical attention right away.  - Follow up with neurosurgery (information provided) to discuss elective surgery for lumbar spinal stenosis. You will receive home health therapy (to be arranged prior to discharge).   Increase activity slowly   Complete by: As directed      Allergies as of 05/25/2019   No Known Allergies     Medication List    STOP taking these medications   amLODipine 5 MG tablet Commonly known as: NORVASC   hydrochlorothiazide 25 MG tablet Commonly known as: HYDRODIURIL   HYDROcodone-acetaminophen 5-325 MG tablet Commonly known as: NORCO/VICODIN   lisinopril 20 MG tablet Commonly known as: ZESTRIL   naproxen 375 MG tablet Commonly known as: NAPROSYN   omeprazole 20 MG capsule Commonly known as: PriLOSEC     TAKE these medications   amoxicillin-clavulanate 875-125 MG tablet Commonly known as: AUGMENTIN Take 1 tablet by mouth every 12 (twelve) hours.   aspirin EC 81 MG tablet Take 81 mg by mouth daily.   MULTIPLE VITAMIN PO Take 1 tablet by mouth daily.   naproxen sodium 220 MG tablet Commonly known as: ALEVE Take 220 mg by mouth daily as needed (pain, headache).   ondansetron 4 MG tablet Commonly known as: ZOFRAN Take 1 tablet (4 mg total) by mouth every 6 (six) hours as needed for nausea.      Follow-up Information    Osei-Bonsu, Iona Beard, MD. Schedule an appointment as soon as possible for a visit.   Specialty: Internal  Medicine Contact information: [address] [address] [place] [address] [phone]        Gastroenterology, [facility]ie [facility]. Schedule an appointment as soon as possible for a visit.   Contact information: [place] [address] [phone]        Consuella Lose, MD. Call.   Specialty: Neurosurgery Contact information: [address] [place] [address] [place] [address] [phone]          No Known Allergies  Consultations:  Neurology  Neurosurgery  Procedures/Studies: EEG  Result Date: 05/21/2019 Lora Havens, MD     05/21/2019  9:39 AM Patient Name: KIYOTO SLOMSKI MRN: [id number] Epilepsy Attending: Lora Havens Referring Physician/Provider: Dr Leonie Man Date: 05/21/2019 Duration: 29.26 mins Patient history: 59yo M with transient spell of altered awareness. EEG to evaluate for seizure Level of alertness: Awake, asleep AEDs during EEG study: None Technical aspects: This EEG study was done with scalp electrodes positioned according to the 10-20 International system of electrode placement. Electrical activity was acquired at a sampling rate of 500Hz  and reviewed with a high frequency filter of 70Hz  and a low frequency filter of 1Hz . EEG data were recorded continuously and digitally stored. Description: The posterior dominant rhythm consists of 9-10 Hz activity of moderate voltage (25-35 uV) seen predominantly in posterior head regions, symmetric and reactive to eye opening and eye closing. Sleep was characterized by vertex waves, sleep spindles (  12 to 14 Hz), maximal frontocentral region.  Hyperventilation and photic stimulation were not performed.   IMPRESSION: This study is within normal limits. No seizures or epileptiform discharges were seen throughout the recording. Lora Havens   CT HEAD WO CONTRAST  Result Date: 05/20/2019 CLINICAL DATA:  Ataxia, stroke suspected, loss of balance EXAM: CT HEAD WITHOUT CONTRAST  TECHNIQUE: Contiguous axial images were obtained from the base of the skull through the vertex without intravenous contrast. COMPARISON:  None. FINDINGS: Brain: No evidence of acute infarction, hemorrhage, hydrocephalus, extra-axial collection or mass lesion/mass effect. Vascular: Atherosclerotic calcification of the carotid siphons. No hyperdense vessel. Skull: No calvarial fracture or suspicious osseous lesion. No scalp swelling or hematoma. Sinuses/Orbits: Paranasal sinuses and mastoid air cells are predominantly clear. Included orbital structures are unremarkable. Other: None IMPRESSION: No acute intracranial findings. If there is persisting clinical concern for infarct, MRI is more sensitive and specific for early changes of ischemia. Electronically Signed   By: Lovena Le M.D.   On: 05/20/2019 17:59   MR BRAIN WO CONTRAST  Result Date: 05/20/2019 CLINICAL DATA:  59 year old male with ataxia, loss of balance. EXAM: MRI HEAD WITHOUT CONTRAST TECHNIQUE: Multiplanar, multiecho pulse sequences of the brain and surrounding structures were obtained without intravenous contrast. COMPARISON:  Head CT 1748 hours today. FINDINGS: Brain: No restricted diffusion to suggest acute infarction. No midline shift, mass effect, evidence of mass lesion, ventriculomegaly, extra-axial collection or acute intracranial hemorrhage. Cervicomedullary junction and pituitary are within normal limits. Pearline Cables and white matter signal is within normal limits for age throughout the brain. No cortical encephalomalacia or chronic cerebral blood products identified. Vascular: Major intracranial vascular flow voids are preserved. Skull and upper cervical spine: Negative visible cervical spine, bone marrow signal. Sinuses/Orbits: Mildly Disconjugate gaze but otherwise negative orbits. Paranasal sinuses and mastoids are stable and well pneumatized. Other: Grossly normal visible internal auditory structures. Stylomastoid foramina appear normal.  Scalp and face soft tissues appear negative. IMPRESSION: No acute intracranial abnormality. Normal for age noncontrast MRI appearance of the brain. No explanation for ataxia. Electronically Signed   By: Genevie Ann M.D.   On: 05/20/2019 18:36   MR THORACIC SPINE W WO CONTRAST  Result Date: 05/20/2019 CLINICAL DATA:  Initial evaluation for bilateral lower extremity weakness in a reflexia. EXAM: MRI THORACIC AND LUMBAR SPINE WITHOUT AND WITH CONTRAST TECHNIQUE: Multiplanar and multiecho pulse sequences of the thoracic and lumbar spine were obtained without and with intravenous contrast. CONTRAST:  29mL GADAVIST GADOBUTROL 1 MMOL/ML IV SOLN COMPARISON:  None. FINDINGS: MRI THORACIC SPINE FINDINGS Alignment: Straightening of the normal thoracic kyphosis. No listhesis or subluxation. Vertebrae: Vertebral body height maintained without evidence for acute or chronic fracture. Bone marrow signal intensity within normal limits. No discrete or worrisome osseous lesions. No abnormal marrow edema or enhancement. Cord: Signal intensity within the thoracic spinal cord is normal. Normal cord caliber and morphology. No abnormal enhancement. No epidural collections. Paraspinal and other soft tissues: Paraspinous soft tissues within normal limits. Visualized lungs are grossly clear. Visualized visceral structures within normal limits. Disc levels: Multilevel degenerative spondylosis noted within the cervical spine on counter sequence without high-grade stenosis. T1-2:  Normal interspace.  Mild facet hypertrophy.  No stenosis. T2-3: Mild disc bulge.  No canal or foraminal stenosis. T3-4:  Unremarkable. T4-5:  Unremarkable. T5-6:  Unremarkable. T6-7:  Minimal disc bulge.  No canal or foraminal stenosis. T7-8:  Mild diffuse disc bulge.  No canal or foraminal stenosis. T8-9: Mild diffuse disc bulge. Superimposed right  subarticular to foraminal disc protrusion (series 25, image 22). No significant spinal stenosis or cord deformity.  Foramina remain patent. T9-10: Mild diffuse disc bulge. Posterior element hypertrophy. No significant canal or foraminal stenosis. T10-11: Mild diffuse disc bulge. Left greater than right facet hypertrophy. No significant spinal stenosis. Moderate left foraminal narrowing. T11-12: Mild diffuse disc bulge with facet hypertrophy. No spinal stenosis. Mild-to-moderate left neural foraminal narrowing. T12-L1:  Unremarkable. MRI LUMBAR SPINE FINDINGS Segmentation: Standard. Lowest well-formed disc space labeled the L5-S1 level. Alignment: Straightening of the normal lumbar lordosis with underlying trace levoscoliosis. No listhesis. Vertebrae: Vertebral body height maintained without evidence for acute or chronic fracture. Bone marrow signal intensity within normal limits. No discrete or worrisome osseous lesions. Prominent discogenic reactive endplate changes present about the L3-4 and L4-5 interspaces. No other abnormal marrow edema or enhancement. No evidence for osteomyelitis discitis or septic arthritis. Conus medullaris: Extends to the L1-2 level and appears normal. Undulation of the nerve roots of the cauda equina related to spinal stenosis at L2-3 through L4-5 noted. No abnormal nerve root enhancement. No epidural collections. Paraspinal and other soft tissues: Paraspinous soft tissues within normal limits. Visualized visceral structures are normal. Disc levels: L1-2: Normal interspace. Mild left greater than right facet hypertrophy. No canal or foraminal stenosis. L2-3: Diffuse disc bulge with disc desiccation and intervertebral disc space narrowing. Mild facet and ligament flavum hypertrophy. Resultant moderate spinal stenosis. Foramina remain patent. L3-4: Advance degenerative intervertebral disc space narrowing with diffuse disc bulge and disc desiccation. Reactive endplate changes with marginal endplate osteophytic spurring. Mild to moderate facet hypertrophy. Trace joint effusion noted on the left. Epidural  lipomatosis. Resultant moderate to severe canal with bilateral subarticular stenosis. Moderate bilateral L3 foraminal narrowing. L4-5: Chronic intervertebral disc space narrowing with diffuse disc bulge and disc desiccation. Reactive endplate changes with marginal endplate osteophytic spurring. Moderate facet hypertrophy. Epidural lipomatosis. Resultant moderate to severe spinal stenosis, with moderate bilateral L4 foraminal narrowing, worse on the right. L5-S1: Mild disc desiccation without significant disc bulge. Moderate facet hypertrophy. Epidural lipomatosis. No canal or lateral recess stenosis. Foramina remain patent. IMPRESSION: MRI THORACIC SPINE IMPRESSION: 1. No acute abnormality within the thoracic spine with normal MRI appearance of the thoracic spinal cord. No findings to explain patient's symptoms identified. 2. Multilevel degenerative disc bulging throughout the mid and lower thoracic spine as above without significant spinal stenosis or cord impingement. Mild to moderate left foraminal narrowing at T10-11 and T11-12 related to disc bulge and facet hypertrophy. No other significant foraminal encroachment. MRI LUMBAR SPINE IMPRESSION: 1. No acute abnormality within the lumbar spine. 2. Multifactorial degenerative changes at L2-3 through L4-5 with resultant moderate to severe diffuse spinal stenosis, with moderate bilateral L3 and L4 foraminal narrowing. 3. Undo lay shin of the nerve roots of the cauda equina secondary to underlying spinal stenosis. Finding could contribute to lower extremity symptoms. No abnormal nerve root enhancement or other acute abnormality. Electronically Signed   By: Jeannine Boga M.D.   On: 05/20/2019 23:14   MR Lumbar Spine W Wo Contrast  Result Date: 05/20/2019 CLINICAL DATA:  Initial evaluation for bilateral lower extremity weakness in a reflexia. EXAM: MRI THORACIC AND LUMBAR SPINE WITHOUT AND WITH CONTRAST TECHNIQUE: Multiplanar and multiecho pulse sequences of  the thoracic and lumbar spine were obtained without and with intravenous contrast. CONTRAST:  40mL GADAVIST GADOBUTROL 1 MMOL/ML IV SOLN COMPARISON:  None. FINDINGS: MRI THORACIC SPINE FINDINGS Alignment: Straightening of the normal thoracic kyphosis. No listhesis or subluxation. Vertebrae: Vertebral  body height maintained without evidence for acute or chronic fracture. Bone marrow signal intensity within normal limits. No discrete or worrisome osseous lesions. No abnormal marrow edema or enhancement. Cord: Signal intensity within the thoracic spinal cord is normal. Normal cord caliber and morphology. No abnormal enhancement. No epidural collections. Paraspinal and other soft tissues: Paraspinous soft tissues within normal limits. Visualized lungs are grossly clear. Visualized visceral structures within normal limits. Disc levels: Multilevel degenerative spondylosis noted within the cervical spine on counter sequence without high-grade stenosis. T1-2:  Normal interspace.  Mild facet hypertrophy.  No stenosis. T2-3: Mild disc bulge.  No canal or foraminal stenosis. T3-4:  Unremarkable. T4-5:  Unremarkable. T5-6:  Unremarkable. T6-7:  Minimal disc bulge.  No canal or foraminal stenosis. T7-8:  Mild diffuse disc bulge.  No canal or foraminal stenosis. T8-9: Mild diffuse disc bulge. Superimposed right subarticular to foraminal disc protrusion (series 25, image 22). No significant spinal stenosis or cord deformity. Foramina remain patent. T9-10: Mild diffuse disc bulge. Posterior element hypertrophy. No significant canal or foraminal stenosis. T10-11: Mild diffuse disc bulge. Left greater than right facet hypertrophy. No significant spinal stenosis. Moderate left foraminal narrowing. T11-12: Mild diffuse disc bulge with facet hypertrophy. No spinal stenosis. Mild-to-moderate left neural foraminal narrowing. T12-L1:  Unremarkable. MRI LUMBAR SPINE FINDINGS Segmentation: Standard. Lowest well-formed disc space labeled the  L5-S1 level. Alignment: Straightening of the normal lumbar lordosis with underlying trace levoscoliosis. No listhesis. Vertebrae: Vertebral body height maintained without evidence for acute or chronic fracture. Bone marrow signal intensity within normal limits. No discrete or worrisome osseous lesions. Prominent discogenic reactive endplate changes present about the L3-4 and L4-5 interspaces. No other abnormal marrow edema or enhancement. No evidence for osteomyelitis discitis or septic arthritis. Conus medullaris: Extends to the L1-2 level and appears normal. Undulation of the nerve roots of the cauda equina related to spinal stenosis at L2-3 through L4-5 noted. No abnormal nerve root enhancement. No epidural collections. Paraspinal and other soft tissues: Paraspinous soft tissues within normal limits. Visualized visceral structures are normal. Disc levels: L1-2: Normal interspace. Mild left greater than right facet hypertrophy. No canal or foraminal stenosis. L2-3: Diffuse disc bulge with disc desiccation and intervertebral disc space narrowing. Mild facet and ligament flavum hypertrophy. Resultant moderate spinal stenosis. Foramina remain patent. L3-4: Advance degenerative intervertebral disc space narrowing with diffuse disc bulge and disc desiccation. Reactive endplate changes with marginal endplate osteophytic spurring. Mild to moderate facet hypertrophy. Trace joint effusion noted on the left. Epidural lipomatosis. Resultant moderate to severe canal with bilateral subarticular stenosis. Moderate bilateral L3 foraminal narrowing. L4-5: Chronic intervertebral disc space narrowing with diffuse disc bulge and disc desiccation. Reactive endplate changes with marginal endplate osteophytic spurring. Moderate facet hypertrophy. Epidural lipomatosis. Resultant moderate to severe spinal stenosis, with moderate bilateral L4 foraminal narrowing, worse on the right. L5-S1: Mild disc desiccation without significant disc  bulge. Moderate facet hypertrophy. Epidural lipomatosis. No canal or lateral recess stenosis. Foramina remain patent. IMPRESSION: MRI THORACIC SPINE IMPRESSION: 1. No acute abnormality within the thoracic spine with normal MRI appearance of the thoracic spinal cord. No findings to explain patient's symptoms identified. 2. Multilevel degenerative disc bulging throughout the mid and lower thoracic spine as above without significant spinal stenosis or cord impingement. Mild to moderate left foraminal narrowing at T10-11 and T11-12 related to disc bulge and facet hypertrophy. No other significant foraminal encroachment. MRI LUMBAR SPINE IMPRESSION: 1. No acute abnormality within the lumbar spine. 2. Multifactorial degenerative changes at L2-3 through L4-5  with resultant moderate to severe diffuse spinal stenosis, with moderate bilateral L3 and L4 foraminal narrowing. 3. Undo lay shin of the nerve roots of the cauda equina secondary to underlying spinal stenosis. Finding could contribute to lower extremity symptoms. No abnormal nerve root enhancement or other acute abnormality. Electronically Signed   By: Jeannine Boga M.D.   On: 05/20/2019 23:14   CT ABDOMEN PELVIS W CONTRAST  Result Date: 05/21/2019 CLINICAL DATA:  Emesis EXAM: CT ABDOMEN AND PELVIS WITH CONTRAST TECHNIQUE: Multidetector CT imaging of the abdomen and pelvis was performed using the standard protocol following bolus administration of intravenous contrast. CONTRAST:  157mL OMNIPAQUE IOHEXOL 300 MG/ML  SOLN COMPARISON:  CT 04/10/2019 FINDINGS: Lower chest: Lung bases demonstrate no acute consolidation or effusion. Cardiac size within normal limits. Hepatobiliary: Calcified gallstones. No focal hepatic abnormality. No biliary dilatation. Possible mild wall thickening at the gastric fundus. Pancreas: Unremarkable. No pancreatic ductal dilatation or surrounding inflammatory changes. Spleen: Normal in size without focal abnormality. Adrenals/Urinary  Tract: Adrenal glands are unremarkable. Kidneys are without hydronephrosis or focal lesion. Punctate stones within the bilateral kidneys. Bladder is unremarkable. Stomach/Bowel: The stomach is nonenlarged. No dilated small bowel. Negative appendix. Wall thickening and inflammatory change involving the sigmoid colon consistent with acute diverticulitis. No perforation. Vascular/Lymphatic: Moderate aortic atherosclerosis without aneurysm. Subcentimeter left iliac nodes. Reproductive: Prostate is unremarkable. Other: No free air. Small amount of fluid in the left lower quadrant Musculoskeletal: Advanced degenerative change at L3-L4 and L4-L5. IMPRESSION: 1. Findings consistent with acute sigmoid colon diverticulitis. No perforation or organized abscess. 2. Gallstones. Possible wall thickening at the gallbladder fundus, ultrasound correlation as indicated 3. Nonobstructing kidney stone Electronically Signed   By: Donavan Foil M.D.   On: 05/21/2019 03:13   DG Chest Port 1 View  Result Date: 05/20/2019 CLINICAL DATA:  Weakness EXAM: PORTABLE CHEST 1 VIEW COMPARISON:  07/06/2005 FINDINGS: Heart and mediastinal contours are within normal limits. No focal opacities or effusions. No acute bony abnormality. IMPRESSION: No active disease. Electronically Signed   By: Rolm Baptise M.D.   On: 05/20/2019 22:46   ECHOCARDIOGRAM COMPLETE  Result Date: 05/21/2019    ECHOCARDIOGRAM REPORT   Patient Name:   BLU LORI Date of Exam: 05/21/2019 Medical Rec #:  [id number]       Height:       70.0 in Accession #:    [id number]      Weight:       290.0 lb Date of Birth:  1960-03-29       BSA:          2.444 m Patient Age:    59 years        BP:           137/86 mmHg Patient Gender: M               HR:           87 bpm. Exam Location:  Inpatient Procedure: 2D Echo, Color Doppler and Cardiac Doppler Indications:    R55 Syncope  History:        Patient has prior history of Echocardiogram examinations, most                 recent  03/02/2015. Risk Factors:Hypertension.  Sonographer:    Raquel Sarna Senior RDCS Referring Phys: [place]  1. Left ventricular ejection fraction, by estimation, is 60 to 65%. The left ventricle has normal function. The left ventricle has no regional wall motion  abnormalities. There is moderate left ventricular hypertrophy. Left ventricular diastolic parameters are consistent with Grade I diastolic dysfunction (impaired relaxation).  2. Right ventricular systolic function is normal. The right ventricular size is normal.  3. The mitral valve is grossly normal. Trivial mitral valve regurgitation.  4. The aortic valve is tricuspid. Aortic valve regurgitation is not visualized.  5. Aortic dilatation noted. There is mild dilatation at the level of the sinuses of Valsalva measuring 41 mm.  6. The inferior vena cava is normal in size with greater than 50% respiratory variability, suggesting right atrial pressure of 3 mmHg. FINDINGS  Left Ventricle: Left ventricular ejection fraction, by estimation, is 60 to 65%. The left ventricle has normal function. The left ventricle has no regional wall motion abnormalities. The left ventricular internal cavity size was normal in size. There is  moderate left ventricular hypertrophy. Left ventricular diastolic parameters are consistent with Grade I diastolic dysfunction (impaired relaxation). Indeterminate filling pressures. Right Ventricle: The right ventricular size is normal. No increase in right ventricular wall thickness. Right ventricular systolic function is normal. Left Atrium: Left atrial size was normal in size. Right Atrium: Right atrial size was normal in size. Pericardium: There is no evidence of pericardial effusion. Mitral Valve: The mitral valve is grossly normal. Trivial mitral valve regurgitation. Tricuspid Valve: The tricuspid valve is grossly normal. Tricuspid valve regurgitation is trivial. Aortic Valve: The aortic valve is tricuspid. Aortic valve  regurgitation is not visualized. Pulmonic Valve: The pulmonic valve was normal in structure. Pulmonic valve regurgitation is not visualized. Aorta: Aortic dilatation noted. There is mild dilatation at the level of the sinuses of Valsalva measuring 41 mm. Venous: The inferior vena cava is normal in size with greater than 50% respiratory variability, suggesting right atrial pressure of 3 mmHg. IAS/Shunts: No atrial level shunt detected by color flow Doppler.  LEFT VENTRICLE PLAX 2D LVIDd:         5.17 cm  Diastology LVIDs:         3.46 cm  LV e' lateral:   9.14 cm/s LV PW:         1.53 cm  LV E/e' lateral: 6.5 LV IVS:        1.40 cm  LV e' medial:    7.29 cm/s LVOT diam:     2.60 cm  LV E/e' medial:  8.1 LV SV:         99 LV SV Index:   40 LVOT Area:     5.31 cm  RIGHT VENTRICLE RV S prime:     19.90 cm/s TAPSE (M-mode): 2.1 cm LEFT ATRIUM             Index       RIGHT ATRIUM           Index LA diam:        3.80 cm 1.55 cm/m  RA Area:     22.00 cm LA Vol (A2C):   53.4 ml 21.85 ml/m RA Volume:   71.70 ml  29.33 ml/m LA Vol (A4C):   70.3 ml 28.76 ml/m LA Biplane Vol: 61.3 ml 25.08 ml/m  AORTIC VALVE LVOT Vmax:   114.00 cm/s LVOT Vmean:  81.500 cm/s LVOT VTI:    0.186 m  AORTA Ao Root diam: 4.10 cm Ao Asc diam:  3.70 cm MITRAL VALVE MV Area (PHT): 2.32 cm    SHUNTS MV Decel Time: 327 msec    Systemic VTI:  0.19 m MV E velocity: 59.10 cm/s  Systemic  Diam: 2.60 cm MV A velocity: 69.00 cm/s MV E/A ratio:  0.86 Lyman Bishop MD Electronically signed by Lyman Bishop MD Signature Date/Time: 05/21/2019/3:02:54 PM    Final      Subjective: Feels well, no abd pain, some nausea which has improved. Tolerated food this morning, getting up on his own to go to bathroom. +BM without blood. Weakness in legs significantly improved.   Discharge Exam: Vitals:   05/24/19 2139 05/25/19 0440  BP: (!) 137/99 130/64  Pulse: 66 63  Resp: 18 20  Temp: 98.8 F (37.1 C) 98.1 F (36.7 C)  SpO2: 96% 96%   General: [name]  alert, awake, not in acute distress Cardiovascular: RRR, S1/S2 +, no rubs, no gallops Respiratory: CTA bilaterally, no wheezing, no rhonchi Abdominal: Soft, NT, ND, bowel sounds + Extremities: No edema, no cyanosis  Labs: BNP (last 3 results) No results for input(s): BNP in the last 8760 hours. Basic Metabolic Panel: Recent Labs  Lab 05/21/19 0538 05/22/19 0235 05/23/19 0308 05/24/19 0230 05/25/19 0427  NA 137 137 137 139 140  K 4.1 3.9 4.0 3.8 4.1  CL 101 99 102 104 105  CO2 26 28 25 28 28   GLUCOSE 126* 107* 102* 96 96  BUN 16 14 15  21* 18  CREATININE 1.20 1.13 0.91 0.94 0.98  CALCIUM 8.9 8.9 8.7* 8.7* 8.6*  MG 1.7  --   --   --   --   PHOS 3.2  --   --   --   --    Liver Function Tests: Recent Labs  Lab 05/20/19 1324 05/20/19 1951 05/21/19 0538 05/22/19 0235  AST 27  --  18 13*  ALT 23  --  22 18  ALKPHOS 87  --  76 59  BILITOT 0.5  --  0.5 0.9  PROT 7.2  --  6.6 6.8  ALBUMIN 3.9 4.0 3.4* 3.2*   No results for input(s): LIPASE, AMYLASE in the last 168 hours. No results for input(s): AMMONIA in the last 168 hours. CBC: Recent Labs  Lab 05/20/19 1324 05/20/19 1941 05/21/19 0538 05/22/19 0235 05/23/19 0308 05/24/19 0230 05/25/19 0427  WBC 10.6*  --  13.1* 14.6* 10.8* 9.7 8.3  NEUTROABS 7.6  --  9.6*  --   --   --   --   HGB 15.0   < > 14.5 14.0 13.2 12.6* 12.5*  HCT 47.0   < > 45.4 44.3 41.6 39.7 39.5  MCV 89.0  --  89.4 88.8 88.9 88.0 88.6  PLT 236  --  207 207 188 198 250   < > = values in this interval not displayed.   Cardiac Enzymes: Recent Labs  Lab 05/21/19 0538  CKTOTAL 244   BNP: Invalid input(s): POCBNP CBG: No results for input(s): GLUCAP in the last 168 hours. D-Dimer No results for input(s): DDIMER in the last 72 hours. Hgb A1c No results for input(s): HGBA1C in the last 72 hours. Lipid Profile No results for input(s): CHOL, HDL, LDLCALC, TRIG, CHOLHDL, LDLDIRECT in the last 72 hours. Thyroid function studies No results for  input(s): TSH, T4TOTAL, T3FREE, THYROIDAB in the last 72 hours.  Invalid input(s): FREET3 Anemia work up No results for input(s): VITAMINB12, FOLATE, FERRITIN, TIBC, IRON, RETICCTPCT in the last 72 hours. Urinalysis    Component Value Date/Time   COLORURINE YELLOW 05/20/2019 1659   APPEARANCEUR CLEAR 05/20/2019 1659   LABSPEC 1.016 05/20/2019 1659   PHURINE 6.0 05/20/2019 [place] 05/20/2019  [place] 05/20/2019 1659   HGBUR moderate 10/17/2007 1442   BILIRUBINUR NEGATIVE 05/20/2019 [place] 05/20/2019 [place] 05/20/2019 1659   UROBILINOGEN 1.0 07/17/2013 1048   NITRITE NEGATIVE 05/20/2019 [place] 05/20/2019 1659    Microbiology Recent Results (from the past 240 hour(s))  Gram stain     Status: None   Collection Time: 05/20/19  7:53 PM   Specimen: CSF; Cerebrospinal Fluid  Result Value Ref Range Status   Specimen Description CSF  Final   Special Requests NONE  Final   Gram Stain   Final    WBC PRESENT, PREDOMINANTLY MONONUCLEAR NO ORGANISMS SEEN CYTOSPIN SMEAR Performed at [facility], [place] [address], [name], [place] [address]    Report Status 05/20/2019 FINAL  Final  SARS Coronavirus 2 by RT PCR (hospital order, performed in [facility] hospital lab) Nasopharyngeal Nasopharyngeal Swab     Status: None   Collection Time: 05/20/19  8:51 PM   Specimen: Nasopharyngeal Swab  Result Value Ref Range Status   SARS Coronavirus 2 NEGATIVE NEGATIVE Final    Comment: (NOTE) SARS-CoV-2 target nucleic acids are NOT DETECTED. The SARS-CoV-2 RNA is generally detectable in upper and lower respiratory specimens during the acute phase of infection. The lowest concentration of SARS-CoV-2 viral copies this assay can detect is 250 copies / mL. A negative result does not preclude SARS-CoV-2 infection and should not be used as the sole basis for treatment or other patient management decisions.  A  negative result may occur with improper specimen collection / handling, submission of specimen other than nasopharyngeal swab, presence of viral mutation(s) within the areas targeted by this assay, and inadequate number of viral copies (<250 copies / mL). A negative result must be combined with clinical observations, patient history, and epidemiological information. Fact Sheet for Patients:   StrictlyIdeas.no Fact Sheet for Healthcare Providers: BankingDealers.co.za This test is not yet approved or cleared  by the [place] FDA and has been authorized for detection and/or diagnosis of SARS-CoV-2 by FDA under an Emergency Use Authorization (EUA).  This EUA will remain in effect (meaning this test can be used) for the duration of the COVID-19 declaration under Section 564(b)(1) of the Act, 21 U.S.C. section 360bbb-3(b)(1), unless the authorization is terminated or revoked sooner. Performed at [facility], [place] [address] [place], [place] [address]     Time coordinating discharge: Approximately 40 minutes  Patrecia Pour, MD  [facility] 05/25/2019, 10:56 AM

## 2020-08-31 ENCOUNTER — Other Ambulatory Visit: Payer: Self-pay

## 2020-08-31 ENCOUNTER — Encounter (HOSPITAL_COMMUNITY): Payer: Self-pay | Admitting: Emergency Medicine

## 2020-08-31 ENCOUNTER — Inpatient Hospital Stay (HOSPITAL_COMMUNITY)
Admission: EM | Admit: 2020-08-31 | Discharge: 2020-09-06 | DRG: 391 | Disposition: A | Discharge status: Home / Self Care | Payer: Self-pay | Attending: Family Medicine | Admitting: Family Medicine

## 2020-08-31 DIAGNOSIS — E7800 Pure hypercholesterolemia, unspecified: Secondary | ICD-10-CM | POA: Diagnosis present

## 2020-08-31 DIAGNOSIS — Z20822 Contact with and (suspected) exposure to covid-19: Secondary | ICD-10-CM | POA: Diagnosis present

## 2020-08-31 DIAGNOSIS — Z79899 Other long term (current) drug therapy: Secondary | ICD-10-CM

## 2020-08-31 DIAGNOSIS — K5720 Diverticulitis of large intestine with perforation and abscess without bleeding: Principal | ICD-10-CM | POA: Diagnosis present

## 2020-08-31 DIAGNOSIS — E669 Obesity, unspecified: Secondary | ICD-10-CM | POA: Diagnosis present

## 2020-08-31 DIAGNOSIS — I252 Old myocardial infarction: Secondary | ICD-10-CM

## 2020-08-31 DIAGNOSIS — Z801 Family history of malignant neoplasm of trachea, bronchus and lung: Secondary | ICD-10-CM

## 2020-08-31 DIAGNOSIS — F17210 Nicotine dependence, cigarettes, uncomplicated: Secondary | ICD-10-CM | POA: Diagnosis present

## 2020-08-31 DIAGNOSIS — K5792 Diverticulitis of intestine, part unspecified, without perforation or abscess without bleeding: Secondary | ICD-10-CM

## 2020-08-31 DIAGNOSIS — Z6841 Body Mass Index (BMI) 40.0 and over, adult: Secondary | ICD-10-CM

## 2020-08-31 DIAGNOSIS — Z7982 Long term (current) use of aspirin: Secondary | ICD-10-CM

## 2020-08-31 DIAGNOSIS — A419 Sepsis, unspecified organism: Secondary | ICD-10-CM | POA: Diagnosis not present

## 2020-08-31 DIAGNOSIS — I10 Essential (primary) hypertension: Secondary | ICD-10-CM | POA: Diagnosis present

## 2020-08-31 DIAGNOSIS — Z8249 Family history of ischemic heart disease and other diseases of the circulatory system: Secondary | ICD-10-CM

## 2020-08-31 DIAGNOSIS — R918 Other nonspecific abnormal finding of lung field: Secondary | ICD-10-CM | POA: Diagnosis present

## 2020-08-31 LAB — CBC WITH DIFFERENTIAL/PLATELET
Abs Immature Granulocytes: 0.09 10*3/uL — ABNORMAL HIGH (ref 0.00–0.07)
Basophils Absolute: 0 10*3/uL (ref 0.0–0.1)
Basophils Relative: 0 %
Eosinophils Absolute: 0.2 10*3/uL (ref 0.0–0.5)
Eosinophils Relative: 1 %
HCT: 44.6 % (ref 39.0–52.0)
Hemoglobin: 14.3 g/dL (ref 13.0–17.0)
Immature Granulocytes: 1 %
Lymphocytes Relative: 10 %
Lymphs Abs: 1.6 10*3/uL (ref 0.7–4.0)
MCH: 28.4 pg (ref 26.0–34.0)
MCHC: 32.1 g/dL (ref 30.0–36.0)
MCV: 88.7 fL (ref 80.0–100.0)
Monocytes Absolute: 1.2 10*3/uL — ABNORMAL HIGH (ref 0.1–1.0)
Monocytes Relative: 8 %
Neutro Abs: 12.5 10*3/uL — ABNORMAL HIGH (ref 1.7–7.7)
Neutrophils Relative %: 80 %
Platelets: 286 10*3/uL (ref 150–400)
RBC: 5.03 MIL/uL (ref 4.22–5.81)
RDW: 13.3 % (ref 11.5–15.5)
WBC: 15.5 10*3/uL — ABNORMAL HIGH (ref 4.0–10.5)
nRBC: 0 % (ref 0.0–0.2)

## 2020-08-31 LAB — URINALYSIS, ROUTINE W REFLEX MICROSCOPIC
Bacteria, UA: NONE SEEN
Bilirubin Urine: NEGATIVE
Glucose, UA: NEGATIVE mg/dL
Hgb urine dipstick: NEGATIVE
Ketones, ur: NEGATIVE mg/dL
Leukocytes,Ua: NEGATIVE
Nitrite: NEGATIVE
Protein, ur: 30 mg/dL — AB
Specific Gravity, Urine: 1.024 (ref 1.005–1.030)
pH: 7 (ref 5.0–8.0)

## 2020-08-31 LAB — COMPREHENSIVE METABOLIC PANEL
ALT: 20 U/L (ref 0–44)
AST: 20 U/L (ref 15–41)
Albumin: 3.4 g/dL — ABNORMAL LOW (ref 3.5–5.0)
Alkaline Phosphatase: 73 U/L (ref 38–126)
Anion gap: 7 (ref 5–15)
BUN: 11 mg/dL (ref 6–20)
CO2: 26 mmol/L (ref 22–32)
Calcium: 8.9 mg/dL (ref 8.9–10.3)
Chloride: 106 mmol/L (ref 98–111)
Creatinine, Ser: 0.82 mg/dL (ref 0.61–1.24)
GFR, Estimated: >60 mL/min (ref >60)
Glucose, Bld: 106 mg/dL — ABNORMAL HIGH (ref 70–99)
Potassium: 4 mmol/L (ref 3.5–5.1)
Sodium: 139 mmol/L (ref 135–145)
Total Bilirubin: 1.2 mg/dL (ref 0.3–1.2)
Total Protein: 6.9 g/dL (ref 6.5–8.1)

## 2020-08-31 LAB — LIPASE, BLOOD: Lipase: 21 U/L (ref 11–51)

## 2020-08-31 NOTE — ED Triage Notes (Signed)
Patient coming from home, complaint of abdominal pain, hx of diverticulitis.

## 2020-08-31 NOTE — ED Provider Notes (Signed)
Emergency Medicine Provider Triage Evaluation Note  Raymond [name] , a 60 y.o. male  was evaluated in triage.  Pt complains of abdominal pain that began last night.  History of diverticulitis requiring hospitalization 1 year ago.  Last bowel movement this morning-normal. Elevated temp.  Review of Systems  Positive: Abd pain, fever Negative: Diarrhea, constip  Physical Exam  BP (!) 171/88 (BP Location: Right Arm)   Pulse 86   Temp 100.1 F (37.8 C) (Oral)   Resp 20   SpO2 92%  Gen:   Awake, no distress   Resp:  Normal effort  MSK:   Moves extremities without difficulty  Other:  Severe tenderness to palpation of LLQ.  Medical Decision Making  Medically screening exam initiated at 3:13 PM.  Appropriate orders placed.  Jacques Earthly was informed that the remainder of the evaluation will be completed by another provider, this initial triage assessment does not replace that evaluation, and the importance of remaining in the ED until their evaluation is complete.    Likely diverticulitis-labs and scans ordered   Rhae Hammock, PA-C 08/31/20 1520    Godfrey Pick, MD 09/02/20 [phone]

## 2020-09-01 ENCOUNTER — Encounter (HOSPITAL_COMMUNITY): Payer: Self-pay | Admitting: Internal Medicine

## 2020-09-01 ENCOUNTER — Emergency Department (HOSPITAL_COMMUNITY): Payer: Self-pay

## 2020-09-01 DIAGNOSIS — K5792 Diverticulitis of intestine, part unspecified, without perforation or abscess without bleeding: Secondary | ICD-10-CM | POA: Diagnosis present

## 2020-09-01 LAB — GLUCOSE, CAPILLARY: Glucose-Capillary: 92 mg/dL (ref 70–99)

## 2020-09-01 LAB — D-DIMER, QUANTITATIVE: D-Dimer, Quant: 1.9 ug/mL-FEU — ABNORMAL HIGH (ref 0.00–0.50)

## 2020-09-01 LAB — CBC
HCT: 40 % (ref 39.0–52.0)
Hemoglobin: 12.4 g/dL — ABNORMAL LOW (ref 13.0–17.0)
MCH: 28.1 pg (ref 26.0–34.0)
MCHC: 31 g/dL (ref 30.0–36.0)
MCV: 90.5 fL (ref 80.0–100.0)
Platelets: 254 10*3/uL (ref 150–400)
RBC: 4.42 MIL/uL (ref 4.22–5.81)
RDW: 13.3 % (ref 11.5–15.5)
WBC: 15.3 10*3/uL — ABNORMAL HIGH (ref 4.0–10.5)
nRBC: 0 % (ref 0.0–0.2)

## 2020-09-01 LAB — LACTIC ACID, PLASMA
Lactic Acid, Venous: 1.2 mmol/L (ref 0.5–1.9)
Lactic Acid, Venous: 1.6 mmol/L (ref 0.5–1.9)

## 2020-09-01 LAB — CREATININE, SERUM
Creatinine, Ser: 1 mg/dL (ref 0.61–1.24)
GFR, Estimated: >60 mL/min (ref >60)

## 2020-09-01 LAB — RESP PANEL BY RT-PCR (FLU A&B, COVID) ARPGX2
Influenza A by PCR: NEGATIVE
Influenza B by PCR: NEGATIVE
SARS Coronavirus 2 by RT PCR: NEGATIVE

## 2020-09-01 LAB — TROPONIN I (HIGH SENSITIVITY)
Troponin I (High Sensitivity): 9 ng/L (ref <18)
Troponin I (High Sensitivity): 11 ng/L (ref <18)

## 2020-09-01 LAB — CBG MONITORING, ED
Glucose-Capillary: 112 mg/dL — ABNORMAL HIGH (ref 70–99)
Glucose-Capillary: 96 mg/dL (ref 70–99)

## 2020-09-01 LAB — HIV ANTIBODY (ROUTINE TESTING W REFLEX): HIV Screen 4th Generation wRfx: NONREACTIVE

## 2020-09-01 MED ORDER — ACETAMINOPHEN 325 MG PO TABS
650.0000 mg | ORAL_TABLET | Freq: Four times a day (QID) | ORAL | Status: DC | PRN
  Administered 2020-09-01 – 2020-09-04 (×4): 650 mg via ORAL
  Filled 2020-09-01 (×4): qty 2

## 2020-09-01 MED ORDER — HEPARIN SODIUM (PORCINE) 5000 UNIT/ML IJ SOLN
5000.0000 [IU] | Freq: Three times a day (TID) | INTRAMUSCULAR | Status: DC
  Administered 2020-09-01 – 2020-09-02 (×4): 5000 [IU] via SUBCUTANEOUS
  Filled 2020-09-01 (×4): qty 1

## 2020-09-01 MED ORDER — HYDRALAZINE HCL 20 MG/ML IJ SOLN: 10.0000 mg | INTRAMUSCULAR | Status: DC | PRN

## 2020-09-01 MED ORDER — ALUM & MAG HYDROXIDE-SIMETH 200-200-20 MG/5ML PO SUSP
15.0000 mL | Freq: Four times a day (QID) | ORAL | Status: DC | PRN
  Administered 2020-09-01 – 2020-09-02 (×2): 15 mL via ORAL
  Filled 2020-09-01 (×2): qty 30

## 2020-09-01 MED ORDER — SODIUM CHLORIDE 0.9 % IV BOLUS
1000.0000 mL | Freq: Once | INTRAVENOUS | Status: AC
  Administered 2020-09-01: 1000 mL via INTRAVENOUS

## 2020-09-01 MED ORDER — MORPHINE SULFATE (PF) 4 MG/ML IV SOLN
4.0000 mg | Freq: Once | INTRAVENOUS | Status: AC
  Administered 2020-09-01: 4 mg via INTRAVENOUS
  Filled 2020-09-01: qty 1

## 2020-09-01 MED ORDER — PIPERACILLIN-TAZOBACTAM 3.375 G IVPB
3.3750 g | Freq: Three times a day (TID) | INTRAVENOUS | Status: DC
  Administered 2020-09-01 – 2020-09-06 (×16): 3.375 g via INTRAVENOUS
  Filled 2020-09-01 (×16): qty 50

## 2020-09-01 MED ORDER — IOHEXOL 350 MG/ML SOLN
80.0000 mL | Freq: Once | INTRAVENOUS | Status: AC | PRN
  Administered 2020-09-01: 80 mL via INTRAVENOUS

## 2020-09-01 MED ORDER — ACETAMINOPHEN 500 MG PO TABS
1000.0000 mg | ORAL_TABLET | Freq: Once | ORAL | Status: AC
  Administered 2020-09-01: 1000 mg via ORAL
  Filled 2020-09-01: qty 2

## 2020-09-01 MED ORDER — FENTANYL CITRATE PF 50 MCG/ML IJ SOSY
25.0000 ug | PREFILLED_SYRINGE | Freq: Once | INTRAMUSCULAR | Status: AC
  Administered 2020-09-01: 25 ug via INTRAVENOUS
  Filled 2020-09-01: qty 1

## 2020-09-01 MED ORDER — HYDROMORPHONE HCL 1 MG/ML IJ SOLN
1.0000 mg | Freq: Once | INTRAMUSCULAR | Status: AC
  Administered 2020-09-01: 1 mg via INTRAVENOUS
  Filled 2020-09-01: qty 1

## 2020-09-01 MED ORDER — MORPHINE SULFATE (PF) 2 MG/ML IV SOLN
2.0000 mg | INTRAVENOUS | Status: DC | PRN
  Administered 2020-09-01 – 2020-09-02 (×7): 2 mg via INTRAVENOUS
  Filled 2020-09-01 (×8): qty 1

## 2020-09-01 MED ORDER — PIPERACILLIN-TAZOBACTAM 3.375 G IVPB 30 MIN
3.3750 g | Freq: Once | INTRAVENOUS | Status: AC
  Administered 2020-09-01: 3.375 g via INTRAVENOUS
  Filled 2020-09-01: qty 50

## 2020-09-01 MED ORDER — HYDRALAZINE HCL 20 MG/ML IJ SOLN
10.0000 mg | Freq: Four times a day (QID) | INTRAMUSCULAR | Status: DC | PRN
  Administered 2020-09-01 – 2020-09-02 (×2): 10 mg via INTRAVENOUS
  Filled 2020-09-01 (×2): qty 1
  Filled 2020-09-01: qty 0.5

## 2020-09-01 MED ORDER — ACETAMINOPHEN 650 MG RE SUPP: 650.0000 mg | Freq: Four times a day (QID) | RECTAL | Status: DC | PRN

## 2020-09-01 MED ORDER — LACTATED RINGERS IV SOLN: INTRAVENOUS | Status: AC

## 2020-09-01 MED ORDER — ONDANSETRON HCL 4 MG/2ML IJ SOLN
4.0000 mg | Freq: Once | INTRAMUSCULAR | Status: AC
  Administered 2020-09-01: 4 mg via INTRAVENOUS
  Filled 2020-09-01: qty 2

## 2020-09-01 NOTE — ED Notes (Signed)
Back from CT

## 2020-09-01 NOTE — ED Notes (Signed)
PO contrast started.

## 2020-09-01 NOTE — ED Notes (Signed)
Patient c/o headache 7/10 PRN medication given

## 2020-09-01 NOTE — Progress Notes (Signed)
60 year old gentleman with a history of hypertension was admitted early morning with sigmoid diverticulitis, he also has a history of sigmoid diverticulitis in May 2021.  He started on Zosyn.  Patient seen and examined in the ED.  Still complains of left lower quadrant pain but now 8 out of 10 instead of 10 out of 10.  No nausea, vomiting or any other complaint.  On examination, he has left lower quadrant abdominal tenderness.  Lungs clear to auscultation.  We will continue Zosyn.  General surgery has been consulted as well.  Keep n.p.o. for now.  Blood pressure slightly elevated.  Takes lisinopril and hydrochlorothiazide at home which I will hold but place him on as needed hydralazine.

## 2020-09-01 NOTE — ED Provider Notes (Signed)
[facility] EMERGENCY DEPARTMENT Provider Note   CSN: [id number] Arrival date & time: 08/31/20  1424     History Chief Complaint  Patient presents with   Abdominal Pain    Raymond [name] is a 60 y.o. male.  The history is provided by the patient and medical records.  Abdominal Pain   60 year old male with history of hypertension, diverticulitis, carpal tunnel, presenting to the ED with lower abdominal pain.  States pain began yesterday evening and has been worsening over the past 24 hours.  Pain localized to left lower abdomen with radiation towards the right lower abdomen.  States his pain is progressed he has began to feel it more generalized throughout his whole abdomen.  He does report some nausea but denies vomiting.  He has not had much of an appetite.  He has not had any bloody bowel movements.  Reports admission last year for about 7 days for diverticulitis, no abscess or perforation at that time.  No prior abdominal surgeries.  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   Baker cyst 02/28/2015   Chest pain, exertional 02/28/2015   Baker's cyst 02/28/2015   Abnormal EKG 02/28/2015   MICROSCOPIC HEMATURIA 10/17/2007   ABDOMINAL PAIN 10/17/2007   HEMATURIA, HX OF 10/17/2007   PSYCHOLOGICAL STRESS 05/31/2007   Obesity, unspecified 04/19/2007   TOBACCO ABUSE 04/19/2007   RHINITIS, ALLERGIC NOS 04/19/2006   HYPERTENSION, BENIGN 03/09/2006   FATIGUE 03/09/2006   WEIGHT GAIN 03/09/2006   HEADACHE 03/09/2006   ANXIETY 03/02/2006   CHEST PAIN 03/02/2006    Past Surgical History:  Procedure Laterality Date   CARDIAC CATHETERIZATION     CARPAL TUNNEL RELEASE     right   HAND  SURGERY     left   KNEE SURGERY     left       Family History  Problem Relation Age of Onset   Lung cancer Mother    Heart failure Sister     Social History   Tobacco Use   Smoking status: Every Day   Smokeless tobacco: Never  Substance Use Topics   Alcohol use: No   Drug use: No    Home Medications Prior to Admission medications   Medication Sig Start Date End Date Taking? Authorizing Provider  amoxicillin-clavulanate (AUGMENTIN) 875-125 MG tablet Take 1 tablet by mouth every 12 (twelve) hours. 05/25/19   Patrecia Pour, MD  aspirin EC 81 MG tablet Take 81 mg by mouth daily.    [provider]  MULTIPLE VITAMIN PO Take 1 tablet by mouth daily.    [provider]  naproxen sodium (ALEVE) 220 MG tablet Take 220 mg by mouth daily as needed (pain, headache).    [provider]  ondansetron (ZOFRAN) 4 MG tablet Take 1 tablet (4 mg total) by mouth every 6 (six) hours as needed for nausea. 05/25/19   Patrecia Pour, MD    Allergies    Patient has no known allergies.  Review of Systems   Review of Systems  Gastrointestinal:  Positive for abdominal pain.  All other systems reviewed and are negative.  Physical Exam Updated Vital Signs BP Marland Kitchen)  145/87   Pulse 74   Temp 98.9 F (37.2 C) (Oral)   Resp 11   SpO2 92%   Physical Exam Vitals and nursing note reviewed.  Constitutional:      Appearance: He is well-developed.     Comments: Uncomfortable appearing  HENT:     Head: Normocephalic and atraumatic.  Eyes:     Conjunctiva/sclera: Conjunctivae normal.     Pupils: Pupils are equal, round, and reactive to light.  Cardiovascular:     Rate and Rhythm: Normal rate and regular rhythm.     Heart sounds: Normal heart sounds.  Pulmonary:     Effort: Pulmonary effort is normal.     Breath sounds: Normal breath sounds.  Abdominal:     General: Bowel sounds are normal.     Palpations: Abdomen is soft.     Tenderness: There is abdominal tenderness  in the right lower quadrant, periumbilical area, left upper quadrant and left lower quadrant.  Musculoskeletal:        General: Normal range of motion.     Cervical back: Normal range of motion.  Skin:    General: Skin is warm and dry.  Neurological:     Mental Status: He is alert and oriented to person, place, and time.    ED Results / Procedures / Treatments   Labs (all labs ordered are listed, but only abnormal results are displayed) Labs Reviewed  CBC WITH DIFFERENTIAL/PLATELET - Abnormal; Notable for the following components:      Result Value   WBC 15.5 (*)    Neutro Abs 12.5 (*)    Monocytes Absolute 1.2 (*)    Abs Immature Granulocytes 0.09 (*)    All other components within normal limits  COMPREHENSIVE METABOLIC PANEL - Abnormal; Notable for the following components:   Glucose, Bld 106 (*)    Albumin 3.4 (*)    All other components within normal limits  URINALYSIS, ROUTINE W REFLEX MICROSCOPIC - Abnormal; Notable for the following components:   Color, Urine AMBER (*)    APPearance HAZY (*)    Protein, ur 30 (*)    All other components within normal limits  RESP PANEL BY RT-PCR (FLU A&B, COVID) ARPGX2  LIPASE, BLOOD    EKG None  Radiology CT Abdomen Pelvis W Contrast  Result Date: 09/01/2020 CLINICAL DATA:  Left lower quadrant pain for 3 days EXAM: CT ABDOMEN AND PELVIS WITH CONTRAST TECHNIQUE: Multidetector CT imaging of the abdomen and pelvis was performed using the standard protocol following bolus administration of intravenous contrast. CONTRAST:  36mL OMNIPAQUE IOHEXOL 350 MG/ML SOLN COMPARISON:  05/21/2019 FINDINGS: Lower chest: Opacity with volume loss in the lower lobes consistent with atelectasis Hepatobiliary: No focal liver abnormality.Cholelithiasis. No evidence of acute cholecystitis. Pancreas: Unremarkable. Spleen: Unremarkable. Adrenals/Urinary Tract: Negative adrenals. No hydronephrosis or ureteral stone. Punctate left renal calculus. Unremarkable  bladder. Stomach/Bowel: Segment of bowel wall thickening and mesenteric stranding at sigmoid colon. The same segment was affected previously when there was better demonstrated numerous colonic diverticula. A low-density component is seen along the anti mesenteric wall, most likely phlegmon. Diffuse colonic stool. Negative for bowel obstruction. No appendicitis Vascular/Lymphatic: Diffuse atheromatous calcification of the aorta and iliacs. No mass or adenopathy. Reproductive:No acute finding. Other: No ascites or pneumoperitoneum. Musculoskeletal: No acute abnormalities. Advanced lumbar spine degeneration with mild scoliosis. Spinal stenosis and foraminal impingement at L3-4 and L4-5 due to extensive endplate ridging. IMPRESSION: 1. Sigmoid diverticulitis in the same distribution as seen by CT in 2021.  Phlegmon is noted along the anti mesenteric wall without drainable fluid collection or pneumoperitoneum. 2. Cholelithiasis. 3. Atelectasis at the bases. Electronically Signed   By: Monte Fantasia M.D.   On: 09/01/2020 04:48    Procedures Procedures   Medications Ordered in ED Medications  piperacillin-tazobactam (ZOSYN) IVPB 3.375 g (3.375 g Intravenous New Bag/Given 09/01/20 0510)  lactated ringers infusion (has no administration in time range)  HYDROmorphone (DILAUDID) injection 1 mg (1 mg Intravenous Given 09/01/20 0127)  ondansetron (ZOFRAN) injection 4 mg (4 mg Intravenous Given 09/01/20 0130)  sodium chloride 0.9 % bolus 1,000 mL (1,000 mLs Intravenous New Bag/Given 09/01/20 0131)  acetaminophen (TYLENOL) tablet 1,000 mg (1,000 mg Oral Given 09/01/20 0132)  morphine 4 MG/ML injection 4 mg (4 mg Intravenous Given 09/01/20 0223)  iohexol (OMNIPAQUE) 350 MG/ML injection 80 mL (80 mLs Intravenous Contrast Given 09/01/20 0436)  morphine 4 MG/ML injection 4 mg (4 mg Intravenous Given 09/01/20 0506)    ED Course  I have reviewed the triage vital signs and the nursing notes.  Pertinent labs & imaging  results that were available during my care of the patient were reviewed by me and considered in my medical decision making (see chart for details).    MDM Rules/Calculators/A&P                           60 y.o. M here with LLQ pain since yesterday.  States it feels like his diverticulitis in the past but worse.  He did have low grade fever on arrival.  Unfortunately, prolonged wait in lobby due to high census.  Saw patient around 10 hour mark and he did appear very uncomfortable.  Abdomen tender in LLQ, RLQ, and periumbilical region.  No peritoneal signs at present.  Will obtain CT scan.  Given IV dilaudid, zofran, IVF.  CT with findings of diverticulitis, no abscess or perforation but does have phlegmon along mesentery.  On reassessment patient still appears very uncomfortable.  Given phlegmon, will admit for IV abx. Started IV zosyn.  Discussed with hospitalist, Dr. Hal Hope-- will admit for ongoing care.  Final Clinical Impression(s) / ED Diagnoses Final diagnoses:  Diverticulitis    Rx / DC Orders ED Discharge Orders     None        Larene Pickett, PA-C 09/01/20 1007    Merryl Hacker, MD 09/01/20 [phone]

## 2020-09-01 NOTE — ED Notes (Signed)
Attempted report call back in 15 minutes

## 2020-09-01 NOTE — H&P (Signed)
History and Physical    Raymond [name] SVX:[id number] DOB: Dec 20, 1960 DOA: 08/31/2020  PCP: Benito Mccreedy, MD  Patient coming from: Home.  Chief Complaint: Abdominal pain.  HPI: Raymond [name] is a 60 y.o. male with history of hypertension who has had a previous episode of sigmoid diverticulitis in May 2021 presents to the ER with complaint of pain around the left lower quadrant for the last 48 hours with some nausea no vomiting.  Has not had any diarrhea.  Pain has been persistent and worsening.  ED Course: In the ER patient labs show leukocytosis.  CT scan of the abdomen pelvis done shows acute sigmoid diverticulitis at the area which patient had last year with phlegmon.  No definite abscess or pneumoperitoneum.  Patient was started on empiric antibiotics fluids and admitted for further management.  COVID test is pending.  Review of Systems: As per HPI, rest all negative.   Past Medical History:  Diagnosis Date   Arthritis    Baker's cyst    right   Diverticulitis    Heart attack ([place]) 2006   Hypertension     Past Surgical History:  Procedure Laterality Date   CARDIAC CATHETERIZATION     CARPAL TUNNEL RELEASE     right   HAND SURGERY     left   KNEE SURGERY     left     reports that he has been smoking. He has never used smokeless tobacco. He reports that he does not drink alcohol and does not use drugs.  No Known Allergies  Family History  Problem Relation Age of Onset   Lung cancer Mother    Heart failure Sister     Prior to Admission medications   Medication Sig Start Date End Date Taking? Authorizing Provider  amoxicillin-clavulanate (AUGMENTIN) 875-125 MG tablet Take 1 tablet by mouth every 12 (twelve) hours. 05/25/19   Patrecia Pour, MD  aspirin EC 81 MG tablet Take 81 mg by mouth daily.    [provider]  MULTIPLE VITAMIN PO Take 1 tablet by mouth daily.    [provider]  naproxen sodium (ALEVE) 220 MG tablet Take 220 mg by  mouth daily as needed (pain, headache).    [provider]  ondansetron (ZOFRAN) 4 MG tablet Take 1 tablet (4 mg total) by mouth every 6 (six) hours as needed for nausea. 05/25/19   Patrecia Pour, MD    Physical Exam: Constitutional: Moderately built and nourished. Vitals:   09/01/20 0345 09/01/20 0429 09/01/20 0430 09/01/20 0445  BP: (!) 147/74  (!) 146/81 (!) 145/87  Pulse: 79  83 74  Resp: 13  19 11   Temp:  98.9 F (37.2 C)    TempSrc:  Oral    SpO2: 93%  95% 92%   Eyes: Anicteric no pallor. ENMT: No discharge from the ears eyes nose and mouth. Neck: No mass felt.  No neck rigidity. Respiratory: No rhonchi or crepitations. Cardiovascular: S1-S2 heard. Abdomen: Left lower quadrant tenderness no guarding or rigidity. Musculoskeletal: No edema. Skin: No rash. Neurologic: Alert awake oriented to time place and person.  Moves all extremities. Psychiatric: Appears normal.  Normal affect.   Labs on Admission: I have personally reviewed following labs and imaging studies  CBC: Recent Labs  Lab 08/31/20 1516  WBC 15.5*  NEUTROABS 12.5*  HGB 14.3  HCT 44.6  MCV 88.7  PLT 233   Basic Metabolic Panel: Recent Labs  Lab 08/31/20 1516  NA 139  K 4.0  CL 106  CO2 26  GLUCOSE 106*  BUN 11  CREATININE 0.82  CALCIUM 8.9   GFR: CrCl cannot be calculated (Unknown ideal weight.). Liver Function Tests: Recent Labs  Lab 08/31/20 1516  AST 20  ALT 20  ALKPHOS 73  BILITOT 1.2  PROT 6.9  ALBUMIN 3.4*   Recent Labs  Lab 08/31/20 1516  LIPASE 21   No results for input(s): AMMONIA in the last 168 hours. Coagulation Profile: No results for input(s): INR, PROTIME in the last 168 hours. Cardiac Enzymes: No results for input(s): CKTOTAL, CKMB, CKMBINDEX, TROPONINI in the last 168 hours. BNP (last 3 results) No results for input(s): PROBNP in the last 8760 hours. HbA1C: No results for input(s): HGBA1C in the last 72 hours. CBG: No results for input(s): GLUCAP  in the last 168 hours. Lipid Profile: No results for input(s): CHOL, HDL, LDLCALC, TRIG, CHOLHDL, LDLDIRECT in the last 72 hours. Thyroid Function Tests: No results for input(s): TSH, T4TOTAL, FREET4, T3FREE, THYROIDAB in the last 72 hours. Anemia Panel: No results for input(s): VITAMINB12, FOLATE, FERRITIN, TIBC, IRON, RETICCTPCT in the last 72 hours. Urine analysis:    Component Value Date/Time   COLORURINE AMBER (A) 08/31/2020 1921   APPEARANCEUR HAZY (A) 08/31/2020 1921   LABSPEC 1.024 08/31/2020 1921   PHURINE 7.0 08/31/2020 1921   GLUCOSEU NEGATIVE 08/31/2020 1921   HGBUR NEGATIVE 08/31/2020 1921   HGBUR moderate 10/17/2007 1442   [place] 08/31/2020 [place] NEGATIVE 08/31/2020 1921   PROTEINUR 30 (A) 08/31/2020 1921   UROBILINOGEN 1.0 07/17/2013 1048   NITRITE NEGATIVE 08/31/2020 1921   LEUKOCYTESUR NEGATIVE 08/31/2020 1921   Sepsis Labs: @LABRCNTIP (procalcitonin:4,lacticidven:4) )No results found for this or any previous visit (from the past 240 hour(s)).   Radiological Exams on Admission: CT Abdomen Pelvis W Contrast  Result Date: 09/01/2020 CLINICAL DATA:  Left lower quadrant pain for 3 days EXAM: CT ABDOMEN AND PELVIS WITH CONTRAST TECHNIQUE: Multidetector CT imaging of the abdomen and pelvis was performed using the standard protocol following bolus administration of intravenous contrast. CONTRAST:  57mL OMNIPAQUE IOHEXOL 350 MG/ML SOLN COMPARISON:  05/21/2019 FINDINGS: Lower chest: Opacity with volume loss in the lower lobes consistent with atelectasis Hepatobiliary: No focal liver abnormality.Cholelithiasis. No evidence of acute cholecystitis. Pancreas: Unremarkable. Spleen: Unremarkable. Adrenals/Urinary Tract: Negative adrenals. No hydronephrosis or ureteral stone. Punctate left renal calculus. Unremarkable bladder. Stomach/Bowel: Segment of bowel wall thickening and mesenteric stranding at sigmoid colon. The same segment was affected previously when  there was better demonstrated numerous colonic diverticula. A low-density component is seen along the anti mesenteric wall, most likely phlegmon. Diffuse colonic stool. Negative for bowel obstruction. No appendicitis Vascular/Lymphatic: Diffuse atheromatous calcification of the aorta and iliacs. No mass or adenopathy. Reproductive:No acute finding. Other: No ascites or pneumoperitoneum. Musculoskeletal: No acute abnormalities. Advanced lumbar spine degeneration with mild scoliosis. Spinal stenosis and foraminal impingement at L3-4 and L4-5 due to extensive endplate ridging. IMPRESSION: 1. Sigmoid diverticulitis in the same distribution as seen by CT in 2021. Phlegmon is noted along the anti mesenteric wall without drainable fluid collection or pneumoperitoneum. 2. Cholelithiasis. 3. Atelectasis at the bases. Electronically Signed   By: Monte Fantasia M.D.   On: 09/01/2020 04:48      Assessment/Plan Principal Problem:   Acute diverticulitis Active Problems:   HYPERTENSION, BENIGN    Acute sigmoid diverticulitis with phlegmon no definite evidence of any abscess or pneumoperitoneum in the CAT scan at this time patient has been placed  on Zosyn, pain relief medications and IV fluids.  Keep patient NPO.  Consult general surgery.  This is a second episode in the last 2 years.  Patient states he has had a colonoscopy about 10 years ago per patient was unremarkable. History of hypertension presently not on medications patient states he ran out of his medicines about a week ago.  We will keep patient on as needed IV hydralazine and positive for blood pressure trends.  COVID test is pending.   Since patient has acute diverticulitis with phlegmon with significant pain will need close monitoring for any further worsening and inpatient status.   DVT prophylaxis: Heparin. Code Status: Full code. Family Communication: Discussed with patient. Disposition Plan: Home. Consults called: We will need to consult  general surgery. Admission status: Inpatient.   Rise Patience MD [facility] Pager [phone].  If 7PM-7AM, please contact night-coverage [email or web address] Password [email or web address]  09/01/2020, 5:51 AM

## 2020-09-01 NOTE — ED Notes (Signed)
Report given to Ashley RN

## 2020-09-01 NOTE — ED Notes (Signed)
Pt finished both bottles of PO contrast.

## 2020-09-02 ENCOUNTER — Inpatient Hospital Stay (HOSPITAL_COMMUNITY): Payer: Self-pay

## 2020-09-02 DIAGNOSIS — K5792 Diverticulitis of intestine, part unspecified, without perforation or abscess without bleeding: Secondary | ICD-10-CM

## 2020-09-02 DIAGNOSIS — I10 Essential (primary) hypertension: Secondary | ICD-10-CM

## 2020-09-02 LAB — BASIC METABOLIC PANEL
Anion gap: 8 (ref 5–15)
BUN: 13 mg/dL (ref 6–20)
CO2: 24 mmol/L (ref 22–32)
Calcium: 8.5 mg/dL — ABNORMAL LOW (ref 8.9–10.3)
Chloride: 104 mmol/L (ref 98–111)
Creatinine, Ser: 0.88 mg/dL (ref 0.61–1.24)
GFR, Estimated: >60 mL/min (ref >60)
Glucose, Bld: 95 mg/dL (ref 70–99)
Potassium: 3.7 mmol/L (ref 3.5–5.1)
Sodium: 136 mmol/L (ref 135–145)

## 2020-09-02 LAB — CBC
HCT: 39.7 % (ref 39.0–52.0)
Hemoglobin: 13 g/dL (ref 13.0–17.0)
MCH: 28.8 pg (ref 26.0–34.0)
MCHC: 32.7 g/dL (ref 30.0–36.0)
MCV: 88 fL (ref 80.0–100.0)
Platelets: 226 10*3/uL (ref 150–400)
RBC: 4.51 MIL/uL (ref 4.22–5.81)
RDW: 13.1 % (ref 11.5–15.5)
WBC: 13.6 10*3/uL — ABNORMAL HIGH (ref 4.0–10.5)
nRBC: 0 % (ref 0.0–0.2)

## 2020-09-02 LAB — GLUCOSE, CAPILLARY
Glucose-Capillary: 86 mg/dL (ref 70–99)
Glucose-Capillary: 93 mg/dL (ref 70–99)

## 2020-09-02 LAB — HEPARIN LEVEL (UNFRACTIONATED): Heparin Unfractionated: 0.27 IU/mL — ABNORMAL LOW (ref 0.30–0.70)

## 2020-09-02 MED ORDER — HYDRALAZINE HCL 20 MG/ML IJ SOLN
10.0000 mg | INTRAMUSCULAR | Status: DC | PRN
  Administered 2020-09-02 – 2020-09-05 (×4): 10 mg via INTRAVENOUS
  Filled 2020-09-02 (×6): qty 1

## 2020-09-02 MED ORDER — SODIUM CHLORIDE 0.9 % IV SOLN: INTRAVENOUS | Status: DC

## 2020-09-02 MED ORDER — HYDROMORPHONE HCL 1 MG/ML IJ SOLN
1.0000 mg | INTRAMUSCULAR | Status: DC | PRN
  Administered 2020-09-02 – 2020-09-06 (×16): 1 mg via INTRAVENOUS
  Filled 2020-09-02 (×18): qty 1

## 2020-09-02 MED ORDER — HEPARIN BOLUS VIA INFUSION
6000.0000 [IU] | Freq: Once | INTRAVENOUS | Status: AC
  Administered 2020-09-02: 6000 [IU] via INTRAVENOUS
  Filled 2020-09-02: qty 6000

## 2020-09-02 MED ORDER — IOHEXOL 350 MG/ML SOLN
80.0000 mL | Freq: Once | INTRAVENOUS | Status: AC | PRN
  Administered 2020-09-02: 80 mL via INTRAVENOUS

## 2020-09-02 MED ORDER — AMLODIPINE BESYLATE 10 MG PO TABS
10.0000 mg | ORAL_TABLET | Freq: Every day | ORAL | Status: DC
  Administered 2020-09-02 – 2020-09-06 (×5): 10 mg via ORAL
  Filled 2020-09-02 (×5): qty 1

## 2020-09-02 MED ORDER — HEPARIN (PORCINE) 25000 UT/250ML-% IV SOLN
2400.0000 [IU]/h | INTRAVENOUS | Status: DC
  Administered 2020-09-02: 1650 [IU]/h via INTRAVENOUS
  Administered 2020-09-03: 1900 [IU]/h via INTRAVENOUS
  Administered 2020-09-03: 2150 [IU]/h via INTRAVENOUS
  Administered 2020-09-04 (×2): 2400 [IU]/h via INTRAVENOUS
  Filled 2020-09-02 (×5): qty 250

## 2020-09-02 MED ORDER — IOHEXOL 9 MG/ML PO SOLN
ORAL | Status: AC
  Administered 2020-09-02: 500 mL
  Filled 2020-09-02: qty 1000

## 2020-09-02 MED ORDER — SIMETHICONE 80 MG PO CHEW: 80.0000 mg | CHEWABLE_TABLET | Freq: Four times a day (QID) | ORAL | Status: DC | PRN

## 2020-09-02 MED ORDER — HEPARIN BOLUS VIA INFUSION
2000.0000 [IU] | Freq: Once | INTRAVENOUS | Status: AC
  Administered 2020-09-02: 2000 [IU] via INTRAVENOUS
  Filled 2020-09-02: qty 2000

## 2020-09-02 NOTE — Progress Notes (Signed)
NP Clarene Essex notified later when flagged mews 3. Fentanyl once ordered and given. Temp came down to 100.5 and BP/HR decreased. Pt currently a green 1 mews at this time.  09/01/20 2006  Assess: MEWS Score  Temp (!) 101.7 F (38.7 C)  BP (!) 188/89  Pulse Rate 95  Resp 18  SpO2 90 %  O2 Device Room Air  Assess: MEWS Score  MEWS Temp 2  MEWS Systolic 0  MEWS Pulse 0  MEWS RR 0  MEWS LOC 0  MEWS Score 2  MEWS Score Color Yellow  Assess: if the MEWS score is Yellow or Red  Were vital signs taken at a resting state? Yes  Focused Assessment No change from prior assessment  Early Detection of Sepsis Score *See Row Information* High  MEWS guidelines implemented *See Row Information* Yes  Treat  MEWS Interventions Administered prn meds/treatments (Hydrazaline and Tylenol at 2130 when it was available. maalox reflux)  Pain Scale 0-10  Pain Score 9  Pain Location Abdomen  Take Vital Signs  Increase Vital Sign Frequency  Yellow: Q 2hr X 2 then Q 4hr X 2, if remains yellow, continue Q 4hrs  Escalate  MEWS: Escalate Yellow: discuss with charge nurse/RN and consider discussing with provider and RRT  Notify: Charge Nurse/RN  Name of Charge Nurse/RN Notified Ronaldo Miyamoto  Date Charge Nurse/RN Notified 09/01/20  Time Charge Nurse/RN Notified 2200  Document  Patient Outcome Stabilized after interventions  Progress note created (see row info) Yes

## 2020-09-02 NOTE — Progress Notes (Addendum)
ANTICOAGULATION CONSULT NOTE - Follow Up Consult  Pharmacy Consult for heparin Indication: pulmonary embolus  Labs: Recent Labs    08/31/20 1516 09/01/20 0604 09/01/20 1824 09/01/20 2200 09/02/20 0018 09/02/20 2133  HGB 14.3 12.4*  --   --  13.0  --   HCT 44.6 40.0  --   --  39.7  --   PLT 286 254  --   --  226  --   HEPARINUNFRC  --   --   --   --   --  0.27*  CREATININE 0.82 1.00  --   --  0.88  --   TROPONINIHS  --   --  9 11  --   --     Assessment: 60yo male slightly subtherapeutic on heparin with initial dosing for CT suspicious for PE; no infusion issues or signs of bleeding per RN.  Goal of Therapy:  Heparin level 0.3-0.7 units/ml   Plan:  Will give small heparin bolus of 2000 units and increase heparin infusion by 2 units/kg/hr to 1900 units/hr and check level in 6 hours.    Wynona Neat, PharmD, BCPS  09/02/2020,11:38 PM

## 2020-09-02 NOTE — Consult Note (Signed)
NAME:  Raymond [name], MRN:  [id number], DOB:  Sep 05, 1960, LOS: 1 ADMISSION DATE:  08/31/2020, CONSULTATION DATE:  8/31 REFERRING MD:  Doristine Bosworth, CHIEF COMPLAINT:  Abdominal pain   History of Present Illness:  60 y/o male admitted with left lower quadrant pain of several days who was found after admission to have a left upper lobe lung mass.  Pulmonary and critical care medicine was consulted for evaluation of the lung mass..  The patient says that he has smoked 1/2 pack of cigarettes daily for the last 40 years and continues to smoke.  He has never been diagnosed with a malignancy in the past such as colon cancer skin cancer etc.  Says his mother died of lung cancer.  He has never been told that he had a lung problem in the past.  He denies shortness of breath or cough or mucus production.  He was admitted to this facility in the setting of left lower quadrant pain and was found to have diverticulitis.  However, after presenting to the emergency department he said that he developed acute onset chest pain which was evaluated with an EKG and blood work and then eventually a CT scan of his chest.  This did not definitively show a blood clot there was concern for that however in the left lower lobe.  In addition he was noted to have a left upper lobe pleural-based mass.  There is hilar and AP window node adenopathy.  Pertinent  Medical History  Hypertension Diverticulitis Hypercholesterolemia Cigarette smoker, ongoing use  Significant Hospital Events: Including procedures, antibiotic start and stop dates in addition to other pertinent events   August 30 admission  Imaging/studies: September 02, 2020 CT angiogram chest images independently reviewed showing an equivocal finding suggestive of a left lower lobe blood clot, there is also a left upper lobe pleural-based mass, AP window and hilar adenopathy on the left but no appreciable mediastinal adenopathy per my review.  There is also left lower lobe  subsegmental atelectasis August 31 CT abdomen pelvis: No significant change in fat stranding and wall thickening in the distal descending proximal and mid sigmoid colon consistent with diverticulitis.  There remains a low-attenuation fluid collection at the left aspect of the proximal sigmoid colon measuring 3.4 x 2.2 cm consistent with phlegmon or abscess.  He says that he is still suffering from significant abdominal pain in the left lower quadrant.  Interim History / Subjective:  As above  Objective   Blood pressure (!) 174/90, pulse 94, temperature 98.8 F (37.1 C), temperature source Oral, resp. rate 19, height 5\' 10"  (1.778 m), weight 135.1 kg, SpO2 100 %.        Intake/Output Summary (Last 24 hours) at 09/02/2020 1441 Last data filed at 09/02/2020 1421 Gross per 24 hour  Intake 2601.93 ml  Output 2325 ml  Net 276.93 ml   Filed Weights   09/01/20 2141  Weight: 135.1 kg    Examination:  General:  mild distress sitting up in chair HENT: NCAT OP clear PULM: CTA B, normal effort CV: RRR, no mgr GI: BS+, soft, tender left lower quadrant MSK: normal bulk and tone Neuro: awake, alert, no distress, MAEW   Resolved Hospital Problem list     Assessment & Plan:  Left upper lobe lung mass with hilar and AP window adenopathy: The best approach would be bronchoscopy for tissue diagnosis with an associated endobronchial ultrasound for surveillance and potential sampling of adenopathy.  However, he needs to have better pain  control from his diverticulitis and we need to sort out as to whether or not he has a blood clot. > For now we will start a heparin drip with the presumption that he may have a blood clot based on the reading from the CT scan of his chest today > Repeat CT angiogram chest tomorrow after we have reviewed kidney function > Once pain control is adequate from diverticulitis we will proceed with bronchoscopy. He may need to be discharged home and antibiotics and return  as an outpatient depending on how he progresses from the principle problem  Possible pulmonary embolism: see discussion above, high risk for this and CT scan suggestive > start heparin infusoin > repeat CT angiogram chest on 9/1 if renal function stable  Best Practice (right click and "Reselect all SmartList Selections" daily)   Per [facility]  Labs   CBC: Recent Labs  Lab 08/31/20 1516 09/01/20 0604 09/02/20 0018  WBC 15.5* 15.3* 13.6*  NEUTROABS 12.5*  --   --   HGB 14.3 12.4* 13.0  HCT 44.6 40.0 39.7  MCV 88.7 90.5 88.0  PLT 286 254 332    Basic Metabolic Panel: Recent Labs  Lab 08/31/20 1516 09/01/20 0604 09/02/20 0018  NA 139  --  136  K 4.0  --  3.7  CL 106  --  104  CO2 26  --  24  GLUCOSE 106*  --  95  BUN 11  --  13  CREATININE 0.82 1.00 0.88  CALCIUM 8.9  --  8.5*   GFR: Estimated Creatinine Clearance: 126.6 mL/min (by C-G formula based on SCr of 0.88 mg/dL). Recent Labs  Lab 08/31/20 1516 09/01/20 0604 09/01/20 1824 09/01/20 2200 09/02/20 0018  WBC 15.5* 15.3*  --   --  13.6*  LATICACIDVEN  --   --  1.2 1.6  --     Liver Function Tests: Recent Labs  Lab 08/31/20 1516  AST 20  ALT 20  ALKPHOS 73  BILITOT 1.2  PROT 6.9  ALBUMIN 3.4*   Recent Labs  Lab 08/31/20 1516  LIPASE 21   No results for input(s): AMMONIA in the last 168 hours.  ABG    Component Value Date/Time   TCO2 30 05/20/2019 1941     Coagulation Profile: No results for input(s): INR, PROTIME in the last 168 hours.  Cardiac Enzymes: No results for input(s): CKTOTAL, CKMB, CKMBINDEX, TROPONINI in the last 168 hours.  HbA1C: Hgb A1c MFr Bld  Date/Time Value Ref Range Status  05/21/2019 10:33 AM 5.8 (H) 4.8 - 5.6 % Final    Comment:    (NOTE)         Prediabetes: 5.7 - 6.4         Diabetes: >6.4         Glycemic control for adults with diabetes: <7.0   03/01/2015 02:13 AM 5.7 (H) 4.8 - 5.6 % Final    Comment:    (NOTE)         Pre-diabetes: 5.7 - 6.4          Diabetes: >6.4         Glycemic control for adults with diabetes: <7.0     CBG: Recent Labs  Lab 09/01/20 1003 09/01/20 1656 09/01/20 2355  GLUCAP 112* 96 92    Review of Systems:   Gen: Denies fever, chills, weight change, fatigue, night sweats HEENT: Denies blurred vision, double vision, hearing loss, tinnitus, sinus congestion, rhinorrhea, sore throat, neck stiffness, dysphagia PULM:  per HPI CV: Denies chest pain, edema, orthopnea, paroxysmal nocturnal dyspnea, palpitations GI: Denies abdominal pain, nausea, vomiting, diarrhea, hematochezia, melena, constipation, change in bowel habits GU: Denies dysuria, hematuria, polyuria, oliguria, urethral discharge Endocrine: Denies hot or cold intolerance, polyuria, polyphagia or appetite change Derm: Denies rash, dry skin, scaling or peeling skin change Heme: Denies easy bruising, bleeding, bleeding gums Neuro: Denies headache, numbness, weakness, slurred speech, loss of memory or consciousness   Past Medical History:  He,  has a past medical history of Arthritis, Baker's cyst, Diverticulitis, Heart attack ([place]) (2006), and Hypertension.   Surgical History:   Past Surgical History:  Procedure Laterality Date   CARDIAC CATHETERIZATION     CARPAL TUNNEL RELEASE     right   HAND SURGERY     left   KNEE SURGERY     left     Social History:   reports that he has been smoking. He has never used smokeless tobacco. He reports that he does not drink alcohol and does not use drugs.   Family History:  His family history includes Heart failure in his sister; Lung cancer in his mother.   Allergies No Known Allergies   Home Medications  Prior to Admission medications   Medication Sig Start Date End Date Taking? Authorizing Provider  Aspirin-Salicylamide-Caffeine (BC HEADACHE PO) Take 1 packet by mouth daily as needed (pain).   Yes [provider]  etodolac (LODINE) 500 MG tablet Take 500 mg by mouth 2 (two) times daily.  07/08/20  Yes [provider]  gabapentin (NEURONTIN) 600 MG tablet Take 600 mg by mouth at bedtime. 07/08/20  Yes [provider]  losartan-hydrochlorothiazide (HYZAAR) 50-12.5 MG tablet Take 1 tablet by mouth daily. 07/09/20  Yes [provider]  Misc Natural Products (OSTEO BI-FLEX ADV JOINT SHIELD) TABS Take 1 tablet by mouth daily.   Yes [provider]  MULTIPLE VITAMIN PO Take 1 tablet by mouth daily.   Yes [provider]     Critical care time: n/a     Roselie Awkward, MD [place] [facility] Pager: [phone] Cell: [phone] After 7:00 pm call Elink  [phone]

## 2020-09-02 NOTE — Progress Notes (Signed)
ANTICOAGULATION CONSULT NOTE - Initial Consult  Pharmacy Consult:  Heparin Indication: Acute PE  No Known Allergies  Patient Measurements: Height: 5\' 10"  (177.8 cm) Weight: 135.1 kg (297 lb 13.5 oz) IBW/kg (Calculated) : 73 Heparin Dosing Weight: 104 kg  Vital Signs: Temp: 98.8 F (37.1 C) (08/31 1232) Temp Source: Oral (08/31 1232) BP: 174/90 (08/31 1232) Pulse Rate: 94 (08/31 1232)  Labs: Recent Labs    08/31/20 1516 09/01/20 0604 09/01/20 1824 09/01/20 2200 09/02/20 0018  HGB 14.3 12.4*  --   --  13.0  HCT 44.6 40.0  --   --  39.7  PLT 286 254  --   --  226  CREATININE 0.82 1.00  --   --  0.88  TROPONINIHS  --   --  9 11  --     Estimated Creatinine Clearance: 126.6 mL/min (by C-G formula based on SCr of 0.88 mg/dL).   Medical History: Past Medical History:  Diagnosis Date   Arthritis    Baker's cyst    right   Diverticulitis    Heart attack ([place]) 2006   Hypertension      Assessment: 67 YOM presented with abdominal pain from sigmoid diverticulitis.  Found to have acute PE with concern for malignancy.  Pharmacy consulted for IV heparin, okay to bolus per discussion with MD.  Last heparin SQ dose earlier this AM.  CBC stable; no bleeding reported.  Goal of Therapy:  Heparin level 0.3-0.7 units/ml Monitor platelets by anticoagulation protocol: Yes   Plan:  D/C heparin SQ Heparin 6000 units IV bolus, then Heparin infusion at 1650 units/hr Check 6 hr heparin level Daily heparin level and CBC  Keyleigh Manninen D. Mina Marble, PharmD, BCPS, [place] 09/02/2020, 2:12 PM

## 2020-09-02 NOTE — Plan of Care (Signed)
Pt alert and oriented x 4 during overnight. Pt flagged yellow mews due to temp. Tylenol was given and ice packs applied and temp decreased. Pain uncontrolled with morphine a 1 x dose of fentanyl ordered by J. Olena Heckle NP notified. Pt decreased to 6. Maalox given x 2 this shift. Hydralazine x 1.  Problem: Pain Managment: Goal: General experience of comfort will improve Outcome: Not Progressing   Problem: Education: Goal: Knowledge of General Education information will improve Description: Including pain rating scale, medication(s)/side effects and non-pharmacologic comfort measures Outcome: Progressing   Problem: Health Behavior/Discharge Planning: Goal: Ability to manage health-related needs will improve Outcome: Progressing   Problem: Clinical Measurements: Goal: Ability to maintain clinical measurements within normal limits will improve Outcome: Progressing Goal: Will remain free from infection Outcome: Progressing Goal: Diagnostic test results will improve Outcome: Progressing Goal: Respiratory complications will improve Outcome: Progressing Goal: Cardiovascular complication will be avoided Outcome: Progressing   Problem: Activity: Goal: Risk for activity intolerance will decrease Outcome: Progressing   Problem: Nutrition: Goal: Adequate nutrition will be maintained Outcome: Progressing   Problem: Coping: Goal: Level of anxiety will decrease Outcome: Progressing   Problem: Elimination: Goal: Will not experience complications related to bowel motility Outcome: Progressing Goal: Will not experience complications related to urinary retention Outcome: Progressing   Problem: Safety: Goal: Ability to remain free from injury will improve Outcome: Progressing   Problem: Skin Integrity: Goal: Risk for impaired skin integrity will decrease Outcome: Progressing

## 2020-09-02 NOTE — Consult Note (Addendum)
Consult Note  Raymond [name] January 09, 1960  [id number].    Requesting MD: Dr. Doristine Bosworth Chief Complaint/Reason for Consult: Diverticulitis  HPI:  60 year old with medical history significant for hypertension, lumbar, diverticulitis who presented to the [facility] ED 8/30 secondary to right lower quadrant pain.  Pain began the evening of 8/27 and progressively worsened prompting his presentation to the emergency department.  Pain is minimally improved with pain medications but otherwise no alleviating or aggravating factors.  He denies nausea, emesis.  He has not eaten much since Saturday evening (8/27).  His last bowel movement was approximately 5 days ago.  He usually has daily bowel movements.  Since admission he has also developed chest pain and shortness of breath.  Work-up significant for CT ABD/PEL 8/30 with sigmoid diverticulitis with phlegmon and CT ABD/PEL 8/31 with the same.  Phlegmon/abscess is approximately 3.4 x 2.2 cm.  WBC on presentation 15.5 and now 13.6.  He had developed chest pain yesterday and CT chest performed with findings suspicious for segmental to subsegmental embolus in the lower lobes and lobulated, masslike consolidation of left upper lobe.  He has had 2 prior episodes of diverticulitis.  One episode in 2018 and one episode 04-05/2019.  Both episodes improved/resolved with medical management. He was admitted during 2021 episode and received IV antibiotics.  His last colonoscopy was approximately 10 years ago and he does not remember any abnormal findings.    At time of my visit he has had continued left lower quadrant pain.  Substance use: 1 pack/day tobacco for 40 years Allergies: None Blood thinners: None prior Past Surgeries: None prior   ROS: Review of Systems  Constitutional:  Negative for chills and fever.  Respiratory:  Positive for shortness of breath. Negative for cough and wheezing.   Cardiovascular:  Positive for chest pain. Negative for  palpitations.  Gastrointestinal:  Positive for abdominal pain and constipation. Negative for diarrhea, nausea and vomiting.  Genitourinary: Negative.    Family History  Problem Relation Age of Onset   Lung cancer Mother    Heart failure Sister     Past Medical History:  Diagnosis Date   Arthritis    Baker's cyst    right   Diverticulitis    Heart attack ([facility]) 2006   Hypertension     Past Surgical History:  Procedure Laterality Date   CARDIAC CATHETERIZATION     CARPAL TUNNEL RELEASE     right   HAND SURGERY     left   KNEE SURGERY     left    Social History:  reports that he has been smoking. He has never used smokeless tobacco. He reports that he does not drink alcohol and does not use drugs.  Allergies: No Known Allergies  Medications Prior to Admission  Medication Sig Dispense Refill   Aspirin-Salicylamide-Caffeine (BC HEADACHE PO) Take 1 packet by mouth daily as needed (pain).     etodolac (LODINE) 500 MG tablet Take 500 mg by mouth 2 (two) times daily.     gabapentin (NEURONTIN) 600 MG tablet Take 600 mg by mouth at bedtime.     losartan-hydrochlorothiazide (HYZAAR) 50-12.5 MG tablet Take 1 tablet by mouth daily.     Misc Natural Products (OSTEO BI-FLEX ADV JOINT SHIELD) TABS Take 1 tablet by mouth daily.     MULTIPLE VITAMIN PO Take 1 tablet by mouth daily.      Blood pressure (!) 174/90, pulse 94, temperature 98.8 F (37.1 C),  temperature source Oral, resp. rate 19, height 5\' 10"  (1.778 m), weight 135.1 kg, SpO2 100 %. Physical Exam:  General: pleasant, WD,  male who is sitting up in chair in NAD. tearful HEENT: head is normocephalic, atraumatic.  Sclera are noninjected.  Pupils equal and round.  Ears and nose without any masses or lesions.  Mouth is pink and moist Heart: regular, rate, and rhythm.  Normal s1,s2. No obvious murmurs, gallops, or rubs noted.  Palpable radial pulses bilaterally Lungs: CTAB, no wheezes, rhonchi, or rales noted.  Respiratory  effort nonlabored Abd: soft, ND, +BS, no masses, hernias, or organomegaly. Focal mild to moderate TTP over LLQ without rebound, guarding or concern for peritonitis MS: all 4 extremities are symmetrical with no cyanosis, clubbing, or edema. Skin: warm and dry with no masses, lesions, or rashes Neuro: Cranial nerves 2-12 grossly intact, sensation is normal throughout Psych: A&Ox3 with an appropriate affect.   Results for orders placed or performed during the hospital encounter of 08/31/20 (from the past 48 hour(s))  CBC with Differential     Status: Abnormal   Collection Time: 08/31/20  3:16 PM  Result Value Ref Range   WBC 15.5 (H) 4.0 - 10.5 K/uL   RBC 5.03 4.22 - 5.81 MIL/uL   Hemoglobin 14.3 13.0 - 17.0 g/dL   HCT 44.6 39.0 - 52.0 %   MCV 88.7 80.0 - 100.0 fL   MCH 28.4 26.0 - 34.0 pg   MCHC 32.1 30.0 - 36.0 g/dL   RDW 13.3 11.5 - 15.5 %   Platelets 286 150 - 400 K/uL   nRBC 0.0 0.0 - 0.2 %   Neutrophils Relative % 80 %   Neutro Abs 12.5 (H) 1.7 - 7.7 K/uL   Lymphocytes Relative 10 %   Lymphs Abs 1.6 0.7 - 4.0 K/uL   Monocytes Relative 8 %   Monocytes Absolute 1.2 (H) 0.1 - 1.0 K/uL   Eosinophils Relative 1 %   Eosinophils Absolute 0.2 0.0 - 0.5 K/uL   Basophils Relative 0 %   Basophils Absolute 0.0 0.0 - 0.1 K/uL   Immature Granulocytes 1 %   Abs Immature Granulocytes 0.09 (H) 0.00 - 0.07 K/uL    Comment: Performed at [facility], [address] [place], [place] [address]  Comprehensive metabolic panel     Status: Abnormal   Collection Time: 08/31/20  3:16 PM  Result Value Ref Range   Sodium 139 135 - 145 mmol/L   Potassium 4.0 3.5 - 5.1 mmol/L   Chloride 106 98 - 111 mmol/L   CO2 26 22 - 32 mmol/L   Glucose, Bld 106 (H) 70 - 99 mg/dL    Comment: Glucose reference range applies only to samples taken after fasting for at least 8 hours.   BUN 11 6 - 20 mg/dL   Creatinine, Ser 0.82 0.61 - 1.24 mg/dL   Calcium 8.9 8.9 - 10.3 mg/dL   Total Protein 6.9 6.5 - 8.1  g/dL   Albumin 3.4 (L) 3.5 - 5.0 g/dL   AST 20 15 - 41 U/L   ALT 20 0 - 44 U/L   Alkaline Phosphatase 73 38 - 126 U/L   Total Bilirubin 1.2 0.3 - 1.2 mg/dL   GFR, Estimated >60 >60 mL/min    Comment: (NOTE) Calculated using the CKD-EPI Creatinine Equation (2021)    Anion gap 7 5 - 15    Comment: Performed at [facility] [address], [place], [place] [address]  Lipase, blood     Status: None   Collection Time: 08/31/20  3:16 PM  Result Value Ref Range   Lipase 21 11 - 51 U/L    Comment: Performed at [facility] [address], [place], [place] [address]  Urinalysis, Routine w reflex microscopic     Status: Abnormal   Collection Time: 08/31/20  7:21 PM  Result Value Ref Range   Color, Urine AMBER (A) YELLOW    Comment: BIOCHEMICALS MAY BE AFFECTED BY COLOR   APPearance HAZY (A) CLEAR   Specific Gravity, Urine 1.024 1.005 - 1.030   pH 7.0 5.0 - 8.0   Glucose, UA NEGATIVE NEGATIVE mg/dL   Hgb urine dipstick NEGATIVE NEGATIVE   Bilirubin Urine NEGATIVE NEGATIVE   Ketones, ur NEGATIVE NEGATIVE mg/dL   Protein, ur 30 (A) NEGATIVE mg/dL   Nitrite NEGATIVE NEGATIVE   Leukocytes,Ua NEGATIVE NEGATIVE   RBC / HPF 0-5 0 - 5 RBC/hpf   WBC, UA 0-5 0 - 5 WBC/hpf   Bacteria, UA NONE SEEN NONE SEEN   Squamous Epithelial / LPF 0-5 0 - 5   Mucus PRESENT     Comment: Performed at [facility], [place] [address] [place], [place] [address]  Resp Panel by RT-PCR (Flu A&B, Covid) Nasopharyngeal Swab     Status: None   Collection Time: 09/01/20  5:00 AM   Specimen: Nasopharyngeal Swab; Nasopharyngeal(NP) swabs in vial transport medium  Result Value Ref Range   SARS Coronavirus 2 by RT PCR NEGATIVE NEGATIVE    Comment: (NOTE) SARS-CoV-2 target nucleic acids are NOT DETECTED.  The SARS-CoV-2 RNA is generally detectable in upper respiratory specimens during the acute phase of infection. The lowest concentration of SARS-CoV-2 viral copies this assay can detect is 138  copies/mL. A negative result does not preclude SARS-Cov-2 infection and should not be used as the sole basis for treatment or other patient management decisions. A negative result may occur with  improper specimen collection/handling, submission of specimen other than nasopharyngeal swab, presence of viral mutation(s) within the areas targeted by this assay, and inadequate number of viral copies(<138 copies/mL). A negative result must be combined with clinical observations, patient history, and epidemiological information. The expected result is Negative.  Fact Sheet for Patients:  EntrepreneurPulse.com.au  Fact Sheet for Healthcare Providers:  IncredibleEmployment.be  This test is no t yet approved or cleared by the [place] FDA and  has been authorized for detection and/or diagnosis of SARS-CoV-2 by FDA under an Emergency Use Authorization (EUA). This EUA will remain  in effect (meaning this test can be used) for the duration of the COVID-19 declaration under Section 564(b)(1) of the Act, 21 U.S.C.section 360bbb-3(b)(1), unless the authorization is terminated  or revoked sooner.       Influenza A by PCR NEGATIVE NEGATIVE   Influenza B by PCR NEGATIVE NEGATIVE    Comment: (NOTE) The Xpert Xpress SARS-CoV-2/FLU/RSV plus assay is intended as an aid in the diagnosis of influenza from Nasopharyngeal swab specimens and should not be used as a sole basis for treatment. Nasal washings and aspirates are unacceptable for Xpert Xpress SARS-CoV-2/FLU/RSV testing.  Fact Sheet for Patients: EntrepreneurPulse.com.au  Fact Sheet for Healthcare Providers: IncredibleEmployment.be  This test is not yet approved or cleared by the [place] FDA and has been authorized for detection and/or diagnosis of SARS-CoV-2 by FDA under an Emergency Use Authorization (EUA). This EUA will remain in effect (meaning this test  can be used) for the  duration of the COVID-19 declaration under Section 564(b)(1) of the Act, 21 U.S.C. section 360bbb-3(b)(1), unless the authorization is terminated or revoked.  Performed at [facility], [place] [address] [place], [place] [address]   HIV Antibody (routine testing w rflx)     Status: None   Collection Time: 09/01/20  6:04 AM  Result Value Ref Range   HIV Screen 4th Generation wRfx Non Reactive Non Reactive    Comment: Performed at [facility], [place] [address] [place], [place] [address]  CBC     Status: Abnormal   Collection Time: 09/01/20  6:04 AM  Result Value Ref Range   WBC 15.3 (H) 4.0 - 10.5 K/uL   RBC 4.42 4.22 - 5.81 MIL/uL   Hemoglobin 12.4 (L) 13.0 - 17.0 g/dL   HCT 40.0 39.0 - 52.0 %   MCV 90.5 80.0 - 100.0 fL   MCH 28.1 26.0 - 34.0 pg   MCHC 31.0 30.0 - 36.0 g/dL   RDW 13.3 11.5 - 15.5 %   Platelets 254 150 - 400 K/uL   nRBC 0.0 0.0 - 0.2 %    Comment: Performed at [facility], [place] [address], [place], [place] [address]  Creatinine, serum     Status: None   Collection Time: 09/01/20  6:04 AM  Result Value Ref Range   Creatinine, Ser 1.00 0.61 - 1.24 mg/dL   GFR, Estimated >60 >60 mL/min    Comment: (NOTE) Calculated using the CKD-EPI Creatinine Equation (2021) Performed at [facility] [address], [place], [place] [address]   CBG monitoring, ED     Status: Abnormal   Collection Time: 09/01/20 10:03 AM  Result Value Ref Range   Glucose-Capillary 112 (H) 70 - 99 mg/dL    Comment: Glucose reference range applies only to samples taken after fasting for at least 8 hours.  CBG monitoring, ED     Status: None   Collection Time: 09/01/20  4:56 PM  Result Value Ref Range   Glucose-Capillary 96 70 - 99 mg/dL    Comment: Glucose reference range applies only to samples taken after fasting for at least 8 hours.  Lactic acid, plasma     Status: None   Collection Time: 09/01/20  6:24 PM  Result Value Ref Range   Lactic  Acid, Venous 1.2 0.5 - 1.9 mmol/L    Comment: Performed at [facility], [place] [address] [place], [place] [address]  Troponin I (High Sensitivity)     Status: None   Collection Time: 09/01/20  6:24 PM  Result Value Ref Range   Troponin I (High Sensitivity) 9 <18 ng/L    Comment: (NOTE) Elevated high sensitivity troponin I (hsTnI) values and significant  changes across serial measurements may suggest ACS but many other  chronic and acute conditions are known to elevate hsTnI results.  Refer to the "Links" section for chest pain algorithms and additional  guidance. Performed at [facility], [place] [address] [place], [place] [address]   D-dimer, quantitative     Status: Abnormal   Collection Time: 09/01/20  6:24 PM  Result Value Ref Range   D-Dimer, Quant 1.90 (H) 0.00 - 0.50 ug/mL-FEU    Comment: (NOTE) At the manufacturer cut-off value of 0.5 g/mL FEU, this assay has a negative predictive value of 95-100%.This assay is intended for use in conjunction with a clinical pretest probability (PTP) assessment model to exclude pulmonary embolism (PE) and deep  venous thrombosis (DVT) in outpatients suspected of PE or DVT. Results should be correlated with clinical presentation. Performed at [facility], [place] [address] [place], [place] [address]   Lactic acid, plasma     Status: None   Collection Time: 09/01/20 10:00 PM  Result Value Ref Range   Lactic Acid, Venous 1.6 0.5 - 1.9 mmol/L    Comment: Performed at [facility] [address] [place], [place] [address]  Troponin I (High Sensitivity)     Status: None   Collection Time: 09/01/20 10:00 PM  Result Value Ref Range   Troponin I (High Sensitivity) 11 <18 ng/L    Comment: (NOTE) Elevated high sensitivity troponin I (hsTnI) values and significant  changes across serial measurements may suggest ACS but many other  chronic and acute conditions are known to elevate hsTnI results.  Refer to the "Links"  section for chest pain algorithms and additional  guidance. Performed at [facility], [place] [address] [place], [place] [address]   Glucose, capillary     Status: None   Collection Time: 09/01/20 11:55 PM  Result Value Ref Range   Glucose-Capillary 92 70 - 99 mg/dL    Comment: Glucose reference range applies only to samples taken after fasting for at least 8 hours.  CBC     Status: Abnormal   Collection Time: 09/02/20 12:18 AM  Result Value Ref Range   WBC 13.6 (H) 4.0 - 10.5 K/uL   RBC 4.51 4.22 - 5.81 MIL/uL   Hemoglobin 13.0 13.0 - 17.0 g/dL   HCT 39.7 39.0 - 52.0 %   MCV 88.0 80.0 - 100.0 fL   MCH 28.8 26.0 - 34.0 pg   MCHC 32.7 30.0 - 36.0 g/dL   RDW 13.1 11.5 - 15.5 %   Platelets 226 150 - 400 K/uL   nRBC 0.0 0.0 - 0.2 %    Comment: Performed at [facility], [place] [address], [place], [place] [address]  Basic metabolic panel     Status: Abnormal   Collection Time: 09/02/20 12:18 AM  Result Value Ref Range   Sodium 136 135 - 145 mmol/L   Potassium 3.7 3.5 - 5.1 mmol/L   Chloride 104 98 - 111 mmol/L   CO2 24 22 - 32 mmol/L   Glucose, Bld 95 70 - 99 mg/dL    Comment: Glucose reference range applies only to samples taken after fasting for at least 8 hours.   BUN 13 6 - 20 mg/dL   Creatinine, Ser 0.88 0.61 - 1.24 mg/dL   Calcium 8.5 (L) 8.9 - 10.3 mg/dL   GFR, Estimated >60 >60 mL/min    Comment: (NOTE) Calculated using the CKD-EPI Creatinine Equation (2021)    Anion gap 8 5 - 15    Comment: Performed at [facility] [address] [place], [place] [address]   CT Angio Chest Pulmonary Embolism (PE) W or WO Contrast  Result Date: 09/02/2020 CLINICAL DATA:  Shortness of breath, rule out PE EXAM: CT ANGIOGRAPHY CHEST WITH CONTRAST TECHNIQUE: Multidetector CT imaging of the chest was performed using the standard protocol during bolus administration of intravenous contrast. Multiplanar CT image reconstructions and MIPs were obtained to evaluate the vascular  anatomy. CONTRAST:  46mL OMNIPAQUE IOHEXOL 350 MG/ML SOLN COMPARISON:  Chest radiograph, 05/20/2019 FINDINGS: Cardiovascular: Examination for pulmonary embolism is substantially limited by marginal contrast bolus, main pulmonary artery = 180 HU, as well as breath motion artifact. Within this  limitation, findings are suspicious for segmental to subsegmental embolus in the lower lobes (series 1, image 75). Mild cardiomegaly. No pericardial effusion. Mediastinum/Nodes: Enlarged left hilar and AP window lymph nodes, measuring up to 2.3 x 1.5 cm (series 1, image 44). Thyroid gland, trachea, and esophagus demonstrate no significant findings. Lungs/Pleura: There is a lobulated masslike consolidation of the subpleural left upper lobe measuring 4.1 x 3.4 cm (series 4, image 37). There is a more wedge-shaped, heterogeneous opacity of the dependent left lower lobe; suspect that this is distal to embolus (series 4, image 9). Trace bilateral pleural effusions. Upper Abdomen: Please see forthcoming dedicated examination of abdomen and pelvis. Musculoskeletal: No chest wall abnormality. No acute or significant osseous findings. Review of the MIP images confirms the above findings. IMPRESSION: 1. Examination for pulmonary embolism is substantially limited by marginal contrast bolus and breath motion artifact. Within this limitation, findings are suspicious for segmental to subsegmental embolus in the lower lobes. Consider technical repeat examination to further evaluate. 2. There is a lobulated, masslike consolidation of the subpleural left upper lobe measuring 4.1 x 3.4 cm, highly concerning for primary lung malignancy. 3. Enlarged left hilar and AP window nodes, concerning for nodal metastatic disease. 4. There is a more wedge-shaped, heterogeneous opacity of the dependent left lower lobe; suspect that this is distal to embolus. This is concerning for pulmonary infarction. 5. Trace bilateral pleural effusions. These results will  be called to the ordering clinician or representative by the Radiologist Assistant, and communication documented in the PACS or [employer]. Electronically Signed   By: Eddie Candle M.D.   On: 09/02/2020 13:47   CT ABDOMEN PELVIS W CONTRAST  Result Date: 09/02/2020 CLINICAL DATA:  Diverticulitis, complication suspected EXAM: CT ABDOMEN AND PELVIS WITH CONTRAST TECHNIQUE: Multidetector CT imaging of the abdomen and pelvis was performed using the standard protocol following bolus administration of intravenous contrast. CONTRAST:  40mL OMNIPAQUE IOHEXOL 350 MG/ML SOLN COMPARISON:  CT abdomen pelvis, 09/01/2020 FINDINGS: Lower chest: Please see separately dictated examination of the chest. Hepatobiliary: No solid liver abnormality is seen. Faintly calcified gallstones in the gallbladder. No gallbladder wall thickening, or biliary dilatation. Pancreas: Unremarkable. No pancreatic ductal dilatation or surrounding inflammatory changes. Spleen: Normal in size without significant abnormality. Adrenals/Urinary Tract: Adrenal glands are unremarkable. Kidneys are normal, without renal calculi, solid lesion, or hydronephrosis. Bladder is unremarkable. Stomach/Bowel: Stomach is within normal limits. Appendix appears normal. Sigmoid diverticulosis. No significant change in severe wall thickening fat stranding about the distal descending and proximal mid sigmoid colon (series 5, image 78). There remains a low-attenuation fluid collection at the left aspect of the proximal sigmoid measuring approximately 3.4 x 2.2 cm (series 5, image 78, series 8, image 80). Vascular/Lymphatic: Aortic atherosclerosis. No enlarged abdominal or pelvic lymph nodes. Reproductive: No mass or other significant abnormality. Other: No abdominal wall hernia or abnormality. No abdominopelvic ascites. Musculoskeletal: No acute or significant osseous findings. IMPRESSION: 1. No significant change in severe wall thickening fat and stranding about the  distal descending and proximal mid sigmoid colon, consistent with acute diverticulitis. 2. There remains a low-attenuation fluid collection at the left aspect of the proximal sigmoid measuring approximately 3.4 x 2.2 cm, consistent with phlegmon or abscess. 3. Cholelithiasis. Aortic Atherosclerosis (ICD10-I70.0). Electronically Signed   By: Eddie Candle M.D.   On: 09/02/2020 13:52   CT Abdomen Pelvis W Contrast  Result Date: 09/01/2020 CLINICAL DATA:  Left lower quadrant pain for 3 days EXAM: CT ABDOMEN AND PELVIS WITH CONTRAST TECHNIQUE:  Multidetector CT imaging of the abdomen and pelvis was performed using the standard protocol following bolus administration of intravenous contrast. CONTRAST:  67mL OMNIPAQUE IOHEXOL 350 MG/ML SOLN COMPARISON:  05/21/2019 FINDINGS: Lower chest: Opacity with volume loss in the lower lobes consistent with atelectasis Hepatobiliary: No focal liver abnormality.Cholelithiasis. No evidence of acute cholecystitis. Pancreas: Unremarkable. Spleen: Unremarkable. Adrenals/Urinary Tract: Negative adrenals. No hydronephrosis or ureteral stone. Punctate left renal calculus. Unremarkable bladder. Stomach/Bowel: Segment of bowel wall thickening and mesenteric stranding at sigmoid colon. The same segment was affected previously when there was better demonstrated numerous colonic diverticula. A low-density component is seen along the anti mesenteric wall, most likely phlegmon. Diffuse colonic stool. Negative for bowel obstruction. No appendicitis Vascular/Lymphatic: Diffuse atheromatous calcification of the aorta and iliacs. No mass or adenopathy. Reproductive:No acute finding. Other: No ascites or pneumoperitoneum. Musculoskeletal: No acute abnormalities. Advanced lumbar spine degeneration with mild scoliosis. Spinal stenosis and foraminal impingement at L3-4 and L4-5 due to extensive endplate ridging. IMPRESSION: 1. Sigmoid diverticulitis in the same distribution as seen by CT in 2021.  Phlegmon is noted along the anti mesenteric wall without drainable fluid collection or pneumoperitoneum. 2. Cholelithiasis. 3. Atelectasis at the bases. Electronically Signed   By: Monte Fantasia M.D.   On: 09/01/2020 04:48      Assessment/Plan Diverticulitis - CT with Sigmoid diverticulitis in the same distribution as seen by CT in 2021. Phlegmon is noted along the anti mesenteric wall  - febrile to Tmax 102F overnight, WBC 13.6 (15.3). continue to monitor - Recommend bowel rest and continue IV antibiotics. We are hopeful for improvement with medical management as opposed to sigmoidectomy/colostomy and no indication for surgical treatment acutely. Phlegmon/abscess on imaging does not appear amenable to drain. Given his new PE and smoking status he would be higher surgical risk and would need a colostomy. Will continue to follow  He will ultimately need outpatient follow-up with GI for colonoscopy  FEN: NPO, IVF ID: [email or web address] 8/29>> VTE: heparin gtt  Per primary team: Sepsis HTN PE  Winferd Humphrey, [facility] 09/02/2020, 2:04 PM Please see Amion for pager number during day hours 7:00am-4:30pm

## 2020-09-02 NOTE — Progress Notes (Addendum)
PROGRESS NOTE    Raymond [name]  YQM:[id number] DOB: December 17, 1960 DOA: 08/31/2020 PCP: Benito Mccreedy, MD   Brief Narrative:  60 year old gentleman with a history of hypertension was admitted  with sigmoid diverticulitis, he also has a history of sigmoid diverticulitis in May 2021.  He was started on Zosyn.    Assessment & Plan:   Principal Problem:   Acute diverticulitis Active Problems:   HYPERTENSION, BENIGN  Sepsis secondary to Acute sigmoidal diverticulitis with phlegmon but no abscess or pneumoperitoneum: Sepsis was not present at the admission.  Now meets sepsis criteria based on fever 102, tachycardia and leukocytosis.  Lactic acid normal.  Patient's pain is no different than yesterday.  Now he has developed fever, raises suspicion for possible abscess.  We will obtain CT abdomen pelvis with contrast.  Continue Zosyn.Leukocytosis improved though.    Essential hypertension: Appears to be taking losartan and hydrochlorothiazide at home however he ran out of the medications a week before admission so he was not taking them.  Blood pressure now started to creep up.  Since he is going to get IV contrast for CT scans today, I would avoid both hydrochlorothiazide and losartan and start him on amlodipine 10 mg p.o. daily and continue as needed hydralazine.  Chest pain: Patient started having chest pain around 6:30 PM yesterday.  EKG was done right away.  EKG shows normal sinus rhythm with non specific ST-T wave changes.  Troponins were checked which were fairly normal.  D-dimer although elevated at 1.8.  He is still complaining of chest pain however he is not hypoxic.  Also has some rhonchi at the bases bilaterally.  He tells me that pain gets worse with deep breathing and due to pain is not taking deep breaths either.  I suspect possible PE as well as possible pneumonia.  I had ordered CT chest to rule out PE stat this morning, still waiting for this to be done.  Will check procalcitonin.   Monitor closely.  Of note, night NP/hospitalist coverage J. Olena Heckle was given signout yesterday around 7 PM and was requested to follow-up on EKG, D-dimer and troponin.  DVT prophylaxis: heparin injection 5,000 Units Start: 09/01/20 0600   Code Status: Full Code  Family Communication:  None present at bedside.  Plan of care discussed with patient in length and he verbalized understanding and agreed with it.  Status is: Inpatient  Remains inpatient appropriate because:IV treatments appropriate due to intensity of illness or inability to take PO  Dispo: The patient is from: Home              Anticipated d/c is to: Home              Patient currently is not medically stable to d/c.   Difficult to place patient No        Estimated body mass index is 42.74 kg/m as calculated from the following:   Height as of this encounter: 5\' 10"  (1.778 m).   Weight as of this encounter: 135.1 kg.     Nutritional Assessment: Body mass index is 42.74 kg/m.Marland Kitchen Seen by dietician.  I agree with the assessment and plan as outlined below: Nutrition Status:        .  Skin Assessment: I have examined the patient's skin and I agree with the wound assessment as performed by the wound care RN as outlined below:    Consultants:  None  Procedures:  None  Antimicrobials:  Anti-infectives (From admission, onward)  Start     Dose/Rate Route Frequency Ordered Stop   09/01/20 1200  piperacillin-tazobactam (ZOSYN) IVPB 3.375 g        3.375 g 12.5 mL/hr over 240 Minutes Intravenous Every 8 hours 09/01/20 0604     09/01/20 0500  piperacillin-tazobactam (ZOSYN) IVPB 3.375 g        3.375 g 100 mL/hr over 30 Minutes Intravenous  Once 09/01/20 0456 09/01/20 0540          Subjective: Patient seen and examined.  Still complains of left lower quadrant abdominal pain, no better than yesterday.  Also complains of chest pain which is intermittent and pressure-like which gets worse with deep breathing.   No shortness of breath.  Objective: Vitals:   09/01/20 2346 09/02/20 0456 09/02/20 0834 09/02/20 1232  BP: 135/76 (!) 161/88 (!) 160/95 (!) 174/90  Pulse: (!) 103 (!) 103 98 94  Resp: 20 18 16 19   Temp: 99.4 F (37.4 C) 99.8 F (37.7 C) 99.8 F (37.7 C) 98.8 F (37.1 C)  TempSrc: Oral Oral Oral Oral  SpO2: 91% 94% 96% 100%  Weight:      Height:        Intake/Output Summary (Last 24 hours) at 09/02/2020 1254 Last data filed at 09/02/2020 0700 Gross per 24 hour  Intake 2601.93 ml  Output 1225 ml  Net 1376.93 ml   Filed Weights   09/01/20 2141  Weight: 135.1 kg    Examination:  General exam: Appears in pain Respiratory system: Okay the bases bilaterally, respiratory effort normal. Cardiovascular system: S1 & S2 heard, RRR. No JVD, murmurs, rubs, gallops or clicks. No pedal edema. Gastrointestinal system: Abdomen is nondistended, soft and tender at left lower quadrant and periumbilical. No organomegaly or masses felt. Normal bowel sounds heard. Central nervous system: Alert and oriented. No focal neurological deficits. Extremities: Symmetric 5 x 5 power. Skin: No rashes, lesions or ulcers Psychiatry: Judgement and insight appear normal. Mood & affect appropriate.    Data Reviewed: I have personally reviewed following labs and imaging studies  CBC: Recent Labs  Lab 08/31/20 1516 09/01/20 0604 09/02/20 0018  WBC 15.5* 15.3* 13.6*  NEUTROABS 12.5*  --   --   HGB 14.3 12.4* 13.0  HCT 44.6 40.0 39.7  MCV 88.7 90.5 88.0  PLT 286 254 629   Basic Metabolic Panel: Recent Labs  Lab 08/31/20 1516 09/01/20 0604 09/02/20 0018  NA 139  --  136  K 4.0  --  3.7  CL 106  --  104  CO2 26  --  24  GLUCOSE 106*  --  95  BUN 11  --  13  CREATININE 0.82 1.00 0.88  CALCIUM 8.9  --  8.5*   GFR: Estimated Creatinine Clearance: 126.6 mL/min (by C-G formula based on SCr of 0.88 mg/dL). Liver Function Tests: Recent Labs  Lab 08/31/20 1516  AST 20  ALT 20  ALKPHOS 73   BILITOT 1.2  PROT 6.9  ALBUMIN 3.4*   Recent Labs  Lab 08/31/20 1516  LIPASE 21   No results for input(s): AMMONIA in the last 168 hours. Coagulation Profile: No results for input(s): INR, PROTIME in the last 168 hours. Cardiac Enzymes: No results for input(s): CKTOTAL, CKMB, CKMBINDEX, TROPONINI in the last 168 hours. BNP (last 3 results) No results for input(s): PROBNP in the last 8760 hours. HbA1C: No results for input(s): HGBA1C in the last 72 hours. CBG: Recent Labs  Lab 09/01/20 1003 09/01/20 1656 09/01/20 2355  GLUCAP  112* 96 92   Lipid Profile: No results for input(s): CHOL, HDL, LDLCALC, TRIG, CHOLHDL, LDLDIRECT in the last 72 hours. Thyroid Function Tests: No results for input(s): TSH, T4TOTAL, FREET4, T3FREE, THYROIDAB in the last 72 hours. Anemia Panel: No results for input(s): VITAMINB12, FOLATE, FERRITIN, TIBC, IRON, RETICCTPCT in the last 72 hours. Sepsis Labs: Recent Labs  Lab 09/01/20 1824 09/01/20 2200  LATICACIDVEN 1.2 1.6    Recent Results (from the past 240 hour(s))  Resp Panel by RT-PCR (Flu A&B, Covid) Nasopharyngeal Swab     Status: None   Collection Time: 09/01/20  5:00 AM   Specimen: Nasopharyngeal Swab; Nasopharyngeal(NP) swabs in vial transport medium  Result Value Ref Range Status   SARS Coronavirus 2 by RT PCR NEGATIVE NEGATIVE Final    Comment: (NOTE) SARS-CoV-2 target nucleic acids are NOT DETECTED.  The SARS-CoV-2 RNA is generally detectable in upper respiratory specimens during the acute phase of infection. The lowest concentration of SARS-CoV-2 viral copies this assay can detect is 138 copies/mL. A negative result does not preclude SARS-Cov-2 infection and should not be used as the sole basis for treatment or other patient management decisions. A negative result may occur with  improper specimen collection/handling, submission of specimen other than nasopharyngeal swab, presence of viral mutation(s) within the areas  targeted by this assay, and inadequate number of viral copies(<138 copies/mL). A negative result must be combined with clinical observations, patient history, and epidemiological information. The expected result is Negative.  Fact Sheet for Patients:  EntrepreneurPulse.com.au  Fact Sheet for Healthcare Providers:  IncredibleEmployment.be  This test is no t yet approved or cleared by the [place] FDA and  has been authorized for detection and/or diagnosis of SARS-CoV-2 by FDA under an Emergency Use Authorization (EUA). This EUA will remain  in effect (meaning this test can be used) for the duration of the COVID-19 declaration under Section 564(b)(1) of the Act, 21 U.S.C.section 360bbb-3(b)(1), unless the authorization is terminated  or revoked sooner.       Influenza A by PCR NEGATIVE NEGATIVE Final   Influenza B by PCR NEGATIVE NEGATIVE Final    Comment: (NOTE) The Xpert Xpress SARS-CoV-2/FLU/RSV plus assay is intended as an aid in the diagnosis of influenza from Nasopharyngeal swab specimens and should not be used as a sole basis for treatment. Nasal washings and aspirates are unacceptable for Xpert Xpress SARS-CoV-2/FLU/RSV testing.  Fact Sheet for Patients: EntrepreneurPulse.com.au  Fact Sheet for Healthcare Providers: IncredibleEmployment.be  This test is not yet approved or cleared by the [place] FDA and has been authorized for detection and/or diagnosis of SARS-CoV-2 by FDA under an Emergency Use Authorization (EUA). This EUA will remain in effect (meaning this test can be used) for the duration of the COVID-19 declaration under Section 564(b)(1) of the Act, 21 U.S.C. section 360bbb-3(b)(1), unless the authorization is terminated or revoked.  Performed at [facility], [place] [address], [place], [place] [address]       Radiology Studies: CT Abdomen Pelvis W Contrast  Result  Date: 09/01/2020 CLINICAL DATA:  Left lower quadrant pain for 3 days EXAM: CT ABDOMEN AND PELVIS WITH CONTRAST TECHNIQUE: Multidetector CT imaging of the abdomen and pelvis was performed using the standard protocol following bolus administration of intravenous contrast. CONTRAST:  28mL OMNIPAQUE IOHEXOL 350 MG/ML SOLN COMPARISON:  05/21/2019 FINDINGS: Lower chest: Opacity with volume loss in the lower lobes consistent with atelectasis Hepatobiliary: No focal liver abnormality.Cholelithiasis. No evidence of acute cholecystitis. Pancreas: Unremarkable. Spleen: Unremarkable.  Adrenals/Urinary Tract: Negative adrenals. No hydronephrosis or ureteral stone. Punctate left renal calculus. Unremarkable bladder. Stomach/Bowel: Segment of bowel wall thickening and mesenteric stranding at sigmoid colon. The same segment was affected previously when there was better demonstrated numerous colonic diverticula. A low-density component is seen along the anti mesenteric wall, most likely phlegmon. Diffuse colonic stool. Negative for bowel obstruction. No appendicitis Vascular/Lymphatic: Diffuse atheromatous calcification of the aorta and iliacs. No mass or adenopathy. Reproductive:No acute finding. Other: No ascites or pneumoperitoneum. Musculoskeletal: No acute abnormalities. Advanced lumbar spine degeneration with mild scoliosis. Spinal stenosis and foraminal impingement at L3-4 and L4-5 due to extensive endplate ridging. IMPRESSION: 1. Sigmoid diverticulitis in the same distribution as seen by CT in 2021. Phlegmon is noted along the anti mesenteric wall without drainable fluid collection or pneumoperitoneum. 2. Cholelithiasis. 3. Atelectasis at the bases. Electronically Signed   By: Monte Fantasia M.D.   On: 09/01/2020 04:48    Scheduled Meds:  heparin  5,000 Units Subcutaneous Q8H   Continuous Infusions:  piperacillin-tazobactam (ZOSYN)  IV 3.375 g (09/02/20 0512)     LOS: 1 day   Time spent: 35 minutes   Darliss Cheney, MD [facility]  09/02/2020, 12:54 PM   How to contact the [facility] Attending or Consulting provider [place] or covering provider during after hours [place], for this patient?  Check the care team in [facility] and look for a) attending/consulting [facility] provider listed and b) the [facility] team listed. Page or secure chat [phone]. Log into [email or web address] and use [place]'s universal password to access. If you do not have the password, please contact the hospital operator. Locate the [facility] provider you are looking for under [facility] and page to a number that you can be directly reached. If you still have difficulty reaching the provider, please page the [facility] (Director on Call) for the Hospitalists listed on amion for assistance.   Addendum 2:12 PM.  Received results of CT angiogram of chest and pelvis.  Impression as below.  IMPRESSION: 1. Examination for pulmonary embolism is substantially limited by marginal contrast bolus and breath motion artifact. Within this limitation, findings are suspicious for segmental to subsegmental embolus in the lower lobes. Consider technical repeat examination to further evaluate. 2. There is a lobulated, masslike consolidation of the subpleural left upper lobe measuring 4.1 x 3.4 cm, highly concerning for primary lung malignancy. 3. Enlarged left hilar and AP window nodes, concerning for nodal metastatic disease. 4. There is a more wedge-shaped, heterogeneous opacity of the dependent left lower lobe; suspect that this is distal to embolus. This is concerning for pulmonary infarction. 5. Trace bilateral pleural effusions.  IMPRESSION: 1. No significant change in severe wall thickening fat and stranding about the distal descending and proximal mid sigmoid colon, consistent with acute diverticulitis. 2. There remains a low-attenuation fluid collection at the left aspect of the proximal sigmoid measuring approximately 3.4 x 2.2 cm, consistent with  phlegmon or abscess. 3. Cholelithiasis.   It appears that patient has developed possible abscess along with diverticulitis.  I have consulted general surgery.  Continue current antibiotics.  Will defer to them.  New lung mass with hilar lymphadenopathy: I have consulted pulmonary due to to see him.  He will likely need biopsy, timing of the biopsy defer to pulmonology.  New bilateral acute PE with pulmonary infarction: It appears that he also has PE with pulmonary infarction, I have started him on heparin drip.

## 2020-09-02 NOTE — Progress Notes (Signed)
Received a critical call from radiology regarding CT result, Dr. Doristine Bosworth made aware.

## 2020-09-03 LAB — CBC WITH DIFFERENTIAL/PLATELET
Abs Immature Granulocytes: 0.07 10*3/uL (ref 0.00–0.07)
Basophils Absolute: 0 10*3/uL (ref 0.0–0.1)
Basophils Relative: 0 %
Eosinophils Absolute: 0.1 10*3/uL (ref 0.0–0.5)
Eosinophils Relative: 1 %
HCT: 36.6 % — ABNORMAL LOW (ref 39.0–52.0)
Hemoglobin: 12.1 g/dL — ABNORMAL LOW (ref 13.0–17.0)
Immature Granulocytes: 1 %
Lymphocytes Relative: 12 %
Lymphs Abs: 1.6 10*3/uL (ref 0.7–4.0)
MCH: 28.7 pg (ref 26.0–34.0)
MCHC: 33.1 g/dL (ref 30.0–36.0)
MCV: 86.9 fL (ref 80.0–100.0)
Monocytes Absolute: 1.7 10*3/uL — ABNORMAL HIGH (ref 0.1–1.0)
Monocytes Relative: 12 %
Neutro Abs: 10.2 10*3/uL — ABNORMAL HIGH (ref 1.7–7.7)
Neutrophils Relative %: 74 %
Platelets: 230 10*3/uL (ref 150–400)
RBC: 4.21 MIL/uL — ABNORMAL LOW (ref 4.22–5.81)
RDW: 13.2 % (ref 11.5–15.5)
WBC: 13.6 10*3/uL — ABNORMAL HIGH (ref 4.0–10.5)
nRBC: 0 % (ref 0.0–0.2)

## 2020-09-03 LAB — BASIC METABOLIC PANEL WITH GFR
Anion gap: 7 (ref 5–15)
BUN: 11 mg/dL (ref 6–20)
CO2: 24 mmol/L (ref 22–32)
Calcium: 8.4 mg/dL — ABNORMAL LOW (ref 8.9–10.3)
Chloride: 105 mmol/L (ref 98–111)
Creatinine, Ser: 0.85 mg/dL (ref 0.61–1.24)
GFR, Estimated: >60 mL/min
Glucose, Bld: 101 mg/dL — ABNORMAL HIGH (ref 70–99)
Potassium: 3.7 mmol/L (ref 3.5–5.1)
Sodium: 136 mmol/L (ref 135–145)

## 2020-09-03 LAB — HEPARIN LEVEL (UNFRACTIONATED)
Heparin Unfractionated: 0.28 IU/mL — ABNORMAL LOW (ref 0.30–0.70)
Heparin Unfractionated: 0.43 IU/mL (ref 0.30–0.70)

## 2020-09-03 LAB — GLUCOSE, CAPILLARY: Glucose-Capillary: 108 mg/dL — ABNORMAL HIGH (ref 70–99)

## 2020-09-03 LAB — MAGNESIUM: Magnesium: 2 mg/dL (ref 1.7–2.4)

## 2020-09-03 MED ORDER — LABETALOL HCL 5 MG/ML IV SOLN
10.0000 mg | INTRAVENOUS | Status: DC | PRN
  Administered 2020-09-03: 10 mg via INTRAVENOUS
  Filled 2020-09-03: qty 4

## 2020-09-03 MED ORDER — OXYCODONE HCL 5 MG PO TABS
5.0000 mg | ORAL_TABLET | Freq: Four times a day (QID) | ORAL | Status: DC | PRN
  Administered 2020-09-03 – 2020-09-05 (×9): 5 mg via ORAL
  Filled 2020-09-03 (×9): qty 1

## 2020-09-03 MED ORDER — ONDANSETRON HCL 4 MG/2ML IJ SOLN
4.0000 mg | Freq: Four times a day (QID) | INTRAMUSCULAR | Status: DC | PRN
  Administered 2020-09-03 (×2): 4 mg via INTRAVENOUS
  Filled 2020-09-03 (×2): qty 2

## 2020-09-03 NOTE — Progress Notes (Signed)
NAME:  BETTY BROOKS, MRN:  [id number], DOB:  1960/02/04, LOS: 2 ADMISSION DATE:  08/31/2020, CONSULTATION DATE:  8/31 REFERRING MD:  Doristine Bosworth, CHIEF COMPLAINT:  Abdominal pain   History of Present Illness:  60 y/o male admitted with left lower quadrant pain of several days who was found after admission to have a left upper lobe lung mass.  Pulmonary and critical care medicine was consulted for evaluation of the lung mass..  The patient says that he has smoked 1/2 pack of cigarettes daily for the last 40 years and continues to smoke.  He has never been diagnosed with a malignancy in the past such as colon cancer skin cancer etc.  Says his mother died of lung cancer.  He has never been told that he had a lung problem in the past.  He denies shortness of breath or cough or mucus production.  He was admitted to this facility in the setting of left lower quadrant pain and was found to have diverticulitis.  However, after presenting to the emergency department he said that he developed acute onset chest pain which was evaluated with an EKG and blood work and then eventually a CT scan of his chest.  This did not definitively show a blood clot there was concern for that however in the left lower lobe.  In addition he was noted to have a left upper lobe pleural-based mass.  There is hilar and AP window node adenopathy.  Pertinent  Medical History  Hypertension Diverticulitis Hypercholesterolemia Cigarette smoker, ongoing use  Significant Hospital Events: Including procedures, antibiotic start and stop dates in addition to other pertinent events   August 30 admission  Imaging/studies: September 02, 2020 CT angiogram chest images independently reviewed showing an equivocal finding suggestive of a left lower lobe blood clot, there is also a left upper lobe pleural-based mass, AP window and hilar adenopathy on the left but no appreciable mediastinal adenopathy per my review.  There is also left lower lobe  subsegmental atelectasis August 31 CT abdomen pelvis: No significant change in fat stranding and wall thickening in the distal descending proximal and mid sigmoid colon consistent with diverticulitis.  There remains a low-attenuation fluid collection at the left aspect of the proximal sigmoid colon measuring 3.4 x 2.2 cm consistent with phlegmon or abscess.  He says that he is still suffering from significant abdominal pain in the left lower quadrant.  Interim History / Subjective:   Patient feeling somewhat better today. Continues to have tenderness of his left lower quadrant of his abdomen. No fevers, chills or chest pains.  Objective   Blood pressure 137/78, pulse 92, temperature 97.8 F (36.6 C), temperature source Oral, resp. rate 14, height 5\' 10"  (1.778 m), weight 135.1 kg, SpO2 90 %.        Intake/Output Summary (Last 24 hours) at 09/03/2020 1550 Last data filed at 09/03/2020 1000 Gross per 24 hour  Intake 1660.26 ml  Output 2000 ml  Net -339.74 ml   Filed Weights   09/01/20 2141  Weight: 135.1 kg    Examination:  General:  no acute distress, laying in bed HENT: NCAT, sclera anicteric, moist mucous membranes PULM: CTA Bilaterally, normal effort CV: RRR, no mgr GI: BS+, soft, tender left lower quadrant MSK: normal bulk and tone Neuro: awake, alert, no distress, MAEW   Resolved Hospital Problem list     Assessment & Plan:  Left Upper Lobe Lung Mass Hilar/Mediastinal adenopathy ?Segmental Pulmonary Emboli  - Continue heparin drip -  Will plan to repeat CTA chest tomorrow to further evaluate pulmonary emboli as there was motion artifact and inadequate dye timing for the initial scan. - He would benefit from bronchoscopy with EBUS for further evaluation of the adenopathy and lung mass. I believe it would be best to schedule him for outpatient follow up after he recovers from the diverticulitis.  - Will continue to follow along  Best Practice (right click and "Reselect  all SmartList Selections" daily)   Per [facility]  Labs   CBC: Recent Labs  Lab 08/31/20 1516 09/01/20 0604 09/02/20 0018 09/03/20 0552  WBC 15.5* 15.3* 13.6* 13.6*  NEUTROABS 12.5*  --   --  10.2*  HGB 14.3 12.4* 13.0 12.1*  HCT 44.6 40.0 39.7 36.6*  MCV 88.7 90.5 88.0 86.9  PLT 286 254 226 694    Basic Metabolic Panel: Recent Labs  Lab 08/31/20 1516 09/01/20 0604 09/02/20 0018 09/03/20 0552  NA 139  --  136 136  K 4.0  --  3.7 3.7  CL 106  --  104 105  CO2 26  --  24 24  GLUCOSE 106*  --  95 101*  BUN 11  --  13 11  CREATININE 0.82 1.00 0.88 0.85  CALCIUM 8.9  --  8.5* 8.4*  MG  --   --   --  2.0   GFR: Estimated Creatinine Clearance: 131 mL/min (by C-G formula based on SCr of 0.85 mg/dL). Recent Labs  Lab 08/31/20 1516 09/01/20 0604 09/01/20 1824 09/01/20 2200 09/02/20 0018 09/03/20 0552  WBC 15.5* 15.3*  --   --  13.6* 13.6*  LATICACIDVEN  --   --  1.2 1.6  --   --     Liver Function Tests: Recent Labs  Lab 08/31/20 1516  AST 20  ALT 20  ALKPHOS 73  BILITOT 1.2  PROT 6.9  ALBUMIN 3.4*   Recent Labs  Lab 08/31/20 1516  LIPASE 21   No results for input(s): AMMONIA in the last 168 hours.  ABG    Component Value Date/Time   TCO2 30 05/20/2019 1941     Coagulation Profile: No results for input(s): INR, PROTIME in the last 168 hours.  Cardiac Enzymes: No results for input(s): CKTOTAL, CKMB, CKMBINDEX, TROPONINI in the last 168 hours.  HbA1C: Hgb A1c MFr Bld  Date/Time Value Ref Range Status  05/21/2019 10:33 AM 5.8 (H) 4.8 - 5.6 % Final    Comment:    (NOTE)         Prediabetes: 5.7 - 6.4         Diabetes: >6.4         Glycemic control for adults with diabetes: <7.0   03/01/2015 02:13 AM 5.7 (H) 4.8 - 5.6 % Final    Comment:    (NOTE)         Pre-diabetes: 5.7 - 6.4         Diabetes: >6.4         Glycemic control for adults with diabetes: <7.0     CBG: Recent Labs  Lab 09/01/20 1656 09/01/20 2355 09/02/20 1651  09/02/20 2358 09/03/20 0816  GLUCAP 96 92 86 93 108*    Critical care time: n/a     Freda Jackson, MD [facility] Office: [phone]   See Amion for personal pager PCCM on call pager [phone] until 7pm. Please call [place] 7p-7a. [phone]

## 2020-09-03 NOTE — Progress Notes (Addendum)
PROGRESS NOTE    Raymond [name]  UTM:[id number] DOB: 1960-06-13 DOA: 08/31/2020 PCP: Benito Mccreedy, MD   Brief Narrative:  60 year old gentleman with a history of hypertension was admitted  with sigmoid diverticulitis, he also has a history of sigmoid diverticulitis in May 2021.  He was started on Zosyn.  Questionable abscess/phlegmon around sigmoid diverticulitis.  General surgery on board but they recommend conservative management with IV antibiotics.  Unfortunately, he was also diagnosed with new lung mass and bilateral PE.  Started on heparin.  PCCM consulted.  Assessment & Plan:   Principal Problem:   Acute diverticulitis Active Problems:   HYPERTENSION, BENIGN  Sepsis secondary to Acute sigmoidal diverticulitis with phlegmon but no abscess or pneumoperitoneum: Sepsis was not present at the admission.  Met sepsis criteria after admission based on fever 102, tachycardia and leukocytosis.  Lactic acid normal.  Repeat CT abdomen with contrast suspicious for possible phlegmon or abscess.  General surgery consulted, per them, it is likely phlegmon.  They recommend continuing antibiotics with conservative management and no surgical procedure at this point in time.  Had low-grade fever once again last night.  Pain is a still 8 out of 10 just like yesterday however clinically he looks improved.  Leukocytosis is stable.  Essential hypertension: Appears to be taking losartan and hydrochlorothiazide at home however he ran out of the medications a week before admission so he was not taking them.  Blood pressure now started to creep up, he was started on amlodipine 10 mg p.o. daily on 09/02/2020.  Blood pressure fairly stable.  Continue that and as needed hydralazine.  Chest pain/bilateral PE: CT angiogram of chest indicates possible bilateral PE however there was some motion degradation.  Started on heparin on 09/02/2020.  Will need repeat of CT angiogram of the chest at some point in time however  I think it would be better to wait until tomorrow and give him a break of 48 hours for IV contrast.  Renal function is a stable.  Continue heparin in the meantime.  New lung mass: CT angiogram of the chest also shows new lung mass.  PCCM consulted.  He will need biopsy eventually, either inpatient or outpatient.  Timing defer to PCCM.  DVT prophylaxis:    Code Status: Full Code  Family Communication:  None present at bedside.  Plan of care discussed with patient in length and he verbalized understanding and agreed with it.  Updated his wife over the phone per his request.  Status is: Inpatient  Remains inpatient appropriate because:IV treatments appropriate due to intensity of illness or inability to take PO  Dispo: The patient is from: Home              Anticipated d/c is to: Home              Patient currently is not medically stable to d/c.   Difficult to place patient No        Estimated body mass index is 42.74 kg/m as calculated from the following:   Height as of this encounter: $RemoveBeforeD'5\' 10"'UZgpKlnoUUpaAd$  (1.778 m).   Weight as of this encounter: 135.1 kg.     Nutritional Assessment: Body mass index is 42.74 kg/m.Marland Kitchen Seen by dietician.  I agree with the assessment and plan as outlined below: Nutrition Status:        .  Skin Assessment: I have examined the patient's skin and I agree with the wound assessment as performed by the wound care RN as  outlined below:    Consultants:  PCCM General surgery  Procedures:  None  Antimicrobials:  Anti-infectives (From admission, onward)    Start     Dose/Rate Route Frequency Ordered Stop   09/01/20 1200  piperacillin-tazobactam (ZOSYN) IVPB 3.375 g        3.375 g 12.5 mL/hr over 240 Minutes Intravenous Every 8 hours 09/01/20 0604     09/01/20 0500  piperacillin-tazobactam (ZOSYN) IVPB 3.375 g        3.375 g 100 mL/hr over 30 Minutes Intravenous  Once 09/01/20 0456 09/01/20 0540          Subjective: Seen and examined.  Overall  feels better than yesterday.  No more chest pain or nausea.  Still has left lower quadrant 8 out of 10 abdominal pain.  No other complaint.  Objective: Vitals:   09/02/20 2017 09/03/20 0347 09/03/20 0815 09/03/20 1000  BP: (!) 145/71 (!) 161/81 (!) 166/80 (!) 172/80  Pulse: 88 (!) 101 90 84  Resp: $Remo'18 18 20   'eYoMg$ Temp: 98.8 F (37.1 C) 99.5 F (37.5 C)  98.5 F (36.9 C)  TempSrc: Oral Oral  Oral  SpO2: 93% 92% 95% 95%  Weight:      Height:        Intake/Output Summary (Last 24 hours) at 09/03/2020 1030 Last data filed at 09/03/2020 1000 Gross per 24 hour  Intake 1760.26 ml  Output 3550 ml  Net -1789.74 ml    Filed Weights   09/01/20 2141  Weight: 135.1 kg    Examination:  General exam: Appears calm and comfortable, obese Respiratory system: Mild rhonchi at the bases bilaterally, much improved than yesterday, respiratory effort normal. Cardiovascular system: S1 & S2 heard, RRR. No JVD, murmurs, rubs, gallops or clicks. No pedal edema. Gastrointestinal system: Abdomen is nondistended, soft and tender at the left lower quadrant and periumbilical. No organomegaly or masses felt. Normal bowel sounds heard. Central nervous system: Alert and oriented. No focal neurological deficits. Extremities: Symmetric 5 x 5 power. Skin: No rashes, lesions or ulcers.  Psychiatry: Judgement and insight appear normal. Mood & affect appropriate.    Data Reviewed: I have personally reviewed following labs and imaging studies  CBC: Recent Labs  Lab 08/31/20 1516 09/01/20 0604 09/02/20 0018 09/03/20 0552  WBC 15.5* 15.3* 13.6* 13.6*  NEUTROABS 12.5*  --   --  10.2*  HGB 14.3 12.4* 13.0 12.1*  HCT 44.6 40.0 39.7 36.6*  MCV 88.7 90.5 88.0 86.9  PLT 286 254 226 915    Basic Metabolic Panel: Recent Labs  Lab 08/31/20 1516 09/01/20 0604 09/02/20 0018 09/03/20 0552  NA 139  --  136 136  K 4.0  --  3.7 3.7  CL 106  --  104 105  CO2 26  --  24 24  GLUCOSE 106*  --  95 101*  BUN 11  --  13  11  CREATININE 0.82 1.00 0.88 0.85  CALCIUM 8.9  --  8.5* 8.4*  MG  --   --   --  2.0    GFR: Estimated Creatinine Clearance: 131 mL/min (by C-G formula based on SCr of 0.85 mg/dL). Liver Function Tests: Recent Labs  Lab 08/31/20 1516  AST 20  ALT 20  ALKPHOS 73  BILITOT 1.2  PROT 6.9  ALBUMIN 3.4*    Recent Labs  Lab 08/31/20 1516  LIPASE 21    No results for input(s): AMMONIA in the last 168 hours. Coagulation Profile: No results for input(s): INR,  PROTIME in the last 168 hours. Cardiac Enzymes: No results for input(s): CKTOTAL, CKMB, CKMBINDEX, TROPONINI in the last 168 hours. BNP (last 3 results) No results for input(s): PROBNP in the last 8760 hours. HbA1C: No results for input(s): HGBA1C in the last 72 hours. CBG: Recent Labs  Lab 09/01/20 1656 09/01/20 2355 09/02/20 1651 09/02/20 2358 09/03/20 0816  GLUCAP 96 92 86 93 108*    Lipid Profile: No results for input(s): CHOL, HDL, LDLCALC, TRIG, CHOLHDL, LDLDIRECT in the last 72 hours. Thyroid Function Tests: No results for input(s): TSH, T4TOTAL, FREET4, T3FREE, THYROIDAB in the last 72 hours. Anemia Panel: No results for input(s): VITAMINB12, FOLATE, FERRITIN, TIBC, IRON, RETICCTPCT in the last 72 hours. Sepsis Labs: Recent Labs  Lab 09/01/20 1824 09/01/20 2200  LATICACIDVEN 1.2 1.6     Recent Results (from the past 240 hour(s))  Resp Panel by RT-PCR (Flu A&B, Covid) Nasopharyngeal Swab     Status: None   Collection Time: 09/01/20  5:00 AM   Specimen: Nasopharyngeal Swab; Nasopharyngeal(NP) swabs in vial transport medium  Result Value Ref Range Status   SARS Coronavirus 2 by RT PCR NEGATIVE NEGATIVE Final    Comment: (NOTE) SARS-CoV-2 target nucleic acids are NOT DETECTED.  The SARS-CoV-2 RNA is generally detectable in upper respiratory specimens during the acute phase of infection. The lowest concentration of SARS-CoV-2 viral copies this assay can detect is 138 copies/mL. A negative result  does not preclude SARS-Cov-2 infection and should not be used as the sole basis for treatment or other patient management decisions. A negative result may occur with  improper specimen collection/handling, submission of specimen other than nasopharyngeal swab, presence of viral mutation(s) within the areas targeted by this assay, and inadequate number of viral copies(<138 copies/mL). A negative result must be combined with clinical observations, patient history, and epidemiological information. The expected result is Negative.  Fact Sheet for Patients:  EntrepreneurPulse.com.au  Fact Sheet for Healthcare Providers:  IncredibleEmployment.be  This test is no t yet approved or cleared by the [place] FDA and  has been authorized for detection and/or diagnosis of SARS-CoV-2 by FDA under an Emergency Use Authorization (EUA). This EUA will remain  in effect (meaning this test can be used) for the duration of the COVID-19 declaration under Section 564(b)(1) of the Act, 21 U.S.C.section 360bbb-3(b)(1), unless the authorization is terminated  or revoked sooner.       Influenza A by PCR NEGATIVE NEGATIVE Final   Influenza B by PCR NEGATIVE NEGATIVE Final    Comment: (NOTE) The Xpert Xpress SARS-CoV-2/FLU/RSV plus assay is intended as an aid in the diagnosis of influenza from Nasopharyngeal swab specimens and should not be used as a sole basis for treatment. Nasal washings and aspirates are unacceptable for Xpert Xpress SARS-CoV-2/FLU/RSV testing.  Fact Sheet for Patients: EntrepreneurPulse.com.au  Fact Sheet for Healthcare Providers: IncredibleEmployment.be  This test is not yet approved or cleared by the [place] FDA and has been authorized for detection and/or diagnosis of SARS-CoV-2 by FDA under an Emergency Use Authorization (EUA). This EUA will remain in effect (meaning this test can be used) for  the duration of the COVID-19 declaration under Section 564(b)(1) of the Act, 21 U.S.C. section 360bbb-3(b)(1), unless the authorization is terminated or revoked.  Performed at [facility], [place] [address] [place], [place] [address]        Radiology Studies: CT Angio Chest Pulmonary Embolism (PE) W or WO Contrast  Result Date: 09/02/2020 CLINICAL DATA:  Shortness  of breath, rule out PE EXAM: CT ANGIOGRAPHY CHEST WITH CONTRAST TECHNIQUE: Multidetector CT imaging of the chest was performed using the standard protocol during bolus administration of intravenous contrast. Multiplanar CT image reconstructions and MIPs were obtained to evaluate the vascular anatomy. CONTRAST:  27mL OMNIPAQUE IOHEXOL 350 MG/ML SOLN COMPARISON:  Chest radiograph, 05/20/2019 FINDINGS: Cardiovascular: Examination for pulmonary embolism is substantially limited by marginal contrast bolus, main pulmonary artery = 180 HU, as well as breath motion artifact. Within this limitation, findings are suspicious for segmental to subsegmental embolus in the lower lobes (series 1, image 75). Mild cardiomegaly. No pericardial effusion. Mediastinum/Nodes: Enlarged left hilar and AP window lymph nodes, measuring up to 2.3 x 1.5 cm (series 1, image 44). Thyroid gland, trachea, and esophagus demonstrate no significant findings. Lungs/Pleura: There is a lobulated masslike consolidation of the subpleural left upper lobe measuring 4.1 x 3.4 cm (series 4, image 37). There is a more wedge-shaped, heterogeneous opacity of the dependent left lower lobe; suspect that this is distal to embolus (series 4, image 9). Trace bilateral pleural effusions. Upper Abdomen: Please see forthcoming dedicated examination of abdomen and pelvis. Musculoskeletal: No chest wall abnormality. No acute or significant osseous findings. Review of the MIP images confirms the above findings. IMPRESSION: 1. Examination for pulmonary embolism is substantially limited by  marginal contrast bolus and breath motion artifact. Within this limitation, findings are suspicious for segmental to subsegmental embolus in the lower lobes. Consider technical repeat examination to further evaluate. 2. There is a lobulated, masslike consolidation of the subpleural left upper lobe measuring 4.1 x 3.4 cm, highly concerning for primary lung malignancy. 3. Enlarged left hilar and AP window nodes, concerning for nodal metastatic disease. 4. There is a more wedge-shaped, heterogeneous opacity of the dependent left lower lobe; suspect that this is distal to embolus. This is concerning for pulmonary infarction. 5. Trace bilateral pleural effusions. These results will be called to the ordering clinician or representative by the Radiologist Assistant, and communication documented in the PACS or [employer]. Electronically Signed   By: Eddie Candle M.D.   On: 09/02/2020 13:47   CT ABDOMEN PELVIS W CONTRAST  Result Date: 09/02/2020 CLINICAL DATA:  Diverticulitis, complication suspected EXAM: CT ABDOMEN AND PELVIS WITH CONTRAST TECHNIQUE: Multidetector CT imaging of the abdomen and pelvis was performed using the standard protocol following bolus administration of intravenous contrast. CONTRAST:  59mL OMNIPAQUE IOHEXOL 350 MG/ML SOLN COMPARISON:  CT abdomen pelvis, 09/01/2020 FINDINGS: Lower chest: Please see separately dictated examination of the chest. Hepatobiliary: No solid liver abnormality is seen. Faintly calcified gallstones in the gallbladder. No gallbladder wall thickening, or biliary dilatation. Pancreas: Unremarkable. No pancreatic ductal dilatation or surrounding inflammatory changes. Spleen: Normal in size without significant abnormality. Adrenals/Urinary Tract: Adrenal glands are unremarkable. Kidneys are normal, without renal calculi, solid lesion, or hydronephrosis. Bladder is unremarkable. Stomach/Bowel: Stomach is within normal limits. Appendix appears normal. Sigmoid diverticulosis.  No significant change in severe wall thickening fat stranding about the distal descending and proximal mid sigmoid colon (series 5, image 78). There remains a low-attenuation fluid collection at the left aspect of the proximal sigmoid measuring approximately 3.4 x 2.2 cm (series 5, image 78, series 8, image 80). Vascular/Lymphatic: Aortic atherosclerosis. No enlarged abdominal or pelvic lymph nodes. Reproductive: No mass or other significant abnormality. Other: No abdominal wall hernia or abnormality. No abdominopelvic ascites. Musculoskeletal: No acute or significant osseous findings. IMPRESSION: 1. No significant change in severe wall thickening fat and stranding about the distal descending  and proximal mid sigmoid colon, consistent with acute diverticulitis. 2. There remains a low-attenuation fluid collection at the left aspect of the proximal sigmoid measuring approximately 3.4 x 2.2 cm, consistent with phlegmon or abscess. 3. Cholelithiasis. Aortic Atherosclerosis (ICD10-I70.0). Electronically Signed   By: Eddie Candle M.D.   On: 09/02/2020 13:52    Scheduled Meds:  amLODipine  10 mg Oral Daily   Continuous Infusions:  sodium chloride 125 mL/hr at 09/03/20 0636   heparin 2,150 Units/hr (09/03/20 0822)   piperacillin-tazobactam (ZOSYN)  IV 3.375 g (09/03/20 0504)     LOS: 2 days   Time spent: 34 minutes   Darliss Cheney, MD [facility]  09/03/2020, 10:30 AM   How to contact the [facility] Attending or Consulting provider [place] or covering provider during after hours [place], for this patient?  Check the care team in [facility] and look for a) attending/consulting [facility] provider listed and b) the [facility] team listed. Page or secure chat [phone]. Log into [email or web address] and use [place]'s universal password to access. If you do not have the password, please contact the hospital operator. Locate the [facility] provider you are looking for under [facility] and page to a number that you can be directly  reached. If you still have difficulty reaching the provider, please page the [facility] (Director on Call) for the Hospitalists listed on amion for assistance.

## 2020-09-03 NOTE — Progress Notes (Signed)
Subjective: CC: Patient reports that he is tolerating cld without n/v. Continues to have intermittent lower abdominal pain/cramping, worse in the LLQ, but reports this is occurring less frequent, is less severe and the duration is shorter when compared to yesterday. He is passing flatus. No BM overnight. Tmax 100.4 yesterday. Although npo is drinking cld and tolerating.   Objective: Vital signs in last 24 hours: Temp:  [98.8 F (37.1 C)-100.4 F (38 C)] 99.5 F (37.5 C) (09/01 0347) Pulse Rate:  [88-106] 90 (09/01 0815) Resp:  [14-20] 20 (09/01 0815) BP: (138-182)/(66-95) 166/80 (09/01 0815) SpO2:  [92 %-100 %] 95 % (09/01 0815) Last BM Date: 08/29/20  Intake/Output from previous day: 08/31 0701 - 09/01 0700 In: 1760.3 [P.O.:350; I.V.:1260.3; IV Piggyback:150] Out: 2900 [Urine:2900] Intake/Output this shift: No intake/output data recorded.  PE: Gen:  Alert, NAD, pleasant Lungs: Normal rate and effort  Abd: Soft, mild distension, mild tenderness of the suprapubic/llq without peritonitis, +BS Psych: A&Ox3  Skin: no rashes noted, warm and dry  Lab Results:  Recent Labs    09/02/20 0018 09/03/20 0552  WBC 13.6* 13.6*  HGB 13.0 12.1*  HCT 39.7 36.6*  PLT 226 230   BMET Recent Labs    09/02/20 0018 09/03/20 0552  NA 136 136  K 3.7 3.7  CL 104 105  CO2 24 24  GLUCOSE 95 101*  BUN 13 11  CREATININE 0.88 0.85  CALCIUM 8.5* 8.4*   PT/INR No results for input(s): LABPROT, INR in the last 72 hours. CMP     Component Value Date/Time   NA 136 09/03/2020 0552   K 3.7 09/03/2020 0552   CL 105 09/03/2020 0552   CO2 24 09/03/2020 0552   GLUCOSE 101 (H) 09/03/2020 0552   BUN 11 09/03/2020 0552   CREATININE 0.85 09/03/2020 0552   CALCIUM 8.4 (L) 09/03/2020 0552   PROT 6.9 08/31/2020 1516   ALBUMIN 3.4 (L) 08/31/2020 1516   ALBUMIN 4.0 05/20/2019 1951   AST 20 08/31/2020 1516   ALT 20 08/31/2020 1516   ALKPHOS 73 08/31/2020 1516   BILITOT 1.2 08/31/2020  1516   GFRNONAA >60 09/03/2020 0552   GFRAA >60 05/25/2019 0427   Lipase     Component Value Date/Time   LIPASE 21 08/31/2020 1516    Studies/Results: CT Angio Chest Pulmonary Embolism (PE) W or WO Contrast  Result Date: 09/02/2020 CLINICAL DATA:  Shortness of breath, rule out PE EXAM: CT ANGIOGRAPHY CHEST WITH CONTRAST TECHNIQUE: Multidetector CT imaging of the chest was performed using the standard protocol during bolus administration of intravenous contrast. Multiplanar CT image reconstructions and MIPs were obtained to evaluate the vascular anatomy. CONTRAST:  75mL OMNIPAQUE IOHEXOL 350 MG/ML SOLN COMPARISON:  Chest radiograph, 05/20/2019 FINDINGS: Cardiovascular: Examination for pulmonary embolism is substantially limited by marginal contrast bolus, main pulmonary artery = 180 HU, as well as breath motion artifact. Within this limitation, findings are suspicious for segmental to subsegmental embolus in the lower lobes (series 1, image 75). Mild cardiomegaly. No pericardial effusion. Mediastinum/Nodes: Enlarged left hilar and AP window lymph nodes, measuring up to 2.3 x 1.5 cm (series 1, image 44). Thyroid gland, trachea, and esophagus demonstrate no significant findings. Lungs/Pleura: There is a lobulated masslike consolidation of the subpleural left upper lobe measuring 4.1 x 3.4 cm (series 4, image 37). There is a more wedge-shaped, heterogeneous opacity of the dependent left lower lobe; suspect that this is distal to embolus (series 4, image 9). Trace bilateral  pleural effusions. Upper Abdomen: Please see forthcoming dedicated examination of abdomen and pelvis. Musculoskeletal: No chest wall abnormality. No acute or significant osseous findings. Review of the MIP images confirms the above findings. IMPRESSION: 1. Examination for pulmonary embolism is substantially limited by marginal contrast bolus and breath motion artifact. Within this limitation, findings are suspicious for segmental to  subsegmental embolus in the lower lobes. Consider technical repeat examination to further evaluate. 2. There is a lobulated, masslike consolidation of the subpleural left upper lobe measuring 4.1 x 3.4 cm, highly concerning for primary lung malignancy. 3. Enlarged left hilar and AP window nodes, concerning for nodal metastatic disease. 4. There is a more wedge-shaped, heterogeneous opacity of the dependent left lower lobe; suspect that this is distal to embolus. This is concerning for pulmonary infarction. 5. Trace bilateral pleural effusions. These results will be called to the ordering clinician or representative by the Radiologist Assistant, and communication documented in the PACS or [employer]. Electronically Signed   By: Eddie Candle M.D.   On: 09/02/2020 13:47   CT ABDOMEN PELVIS W CONTRAST  Result Date: 09/02/2020 CLINICAL DATA:  Diverticulitis, complication suspected EXAM: CT ABDOMEN AND PELVIS WITH CONTRAST TECHNIQUE: Multidetector CT imaging of the abdomen and pelvis was performed using the standard protocol following bolus administration of intravenous contrast. CONTRAST:  66mL OMNIPAQUE IOHEXOL 350 MG/ML SOLN COMPARISON:  CT abdomen pelvis, 09/01/2020 FINDINGS: Lower chest: Please see separately dictated examination of the chest. Hepatobiliary: No solid liver abnormality is seen. Faintly calcified gallstones in the gallbladder. No gallbladder wall thickening, or biliary dilatation. Pancreas: Unremarkable. No pancreatic ductal dilatation or surrounding inflammatory changes. Spleen: Normal in size without significant abnormality. Adrenals/Urinary Tract: Adrenal glands are unremarkable. Kidneys are normal, without renal calculi, solid lesion, or hydronephrosis. Bladder is unremarkable. Stomach/Bowel: Stomach is within normal limits. Appendix appears normal. Sigmoid diverticulosis. No significant change in severe wall thickening fat stranding about the distal descending and proximal mid sigmoid  colon (series 5, image 78). There remains a low-attenuation fluid collection at the left aspect of the proximal sigmoid measuring approximately 3.4 x 2.2 cm (series 5, image 78, series 8, image 80). Vascular/Lymphatic: Aortic atherosclerosis. No enlarged abdominal or pelvic lymph nodes. Reproductive: No mass or other significant abnormality. Other: No abdominal wall hernia or abnormality. No abdominopelvic ascites. Musculoskeletal: No acute or significant osseous findings. IMPRESSION: 1. No significant change in severe wall thickening fat and stranding about the distal descending and proximal mid sigmoid colon, consistent with acute diverticulitis. 2. There remains a low-attenuation fluid collection at the left aspect of the proximal sigmoid measuring approximately 3.4 x 2.2 cm, consistent with phlegmon or abscess. 3. Cholelithiasis. Aortic Atherosclerosis (ICD10-I70.0). Electronically Signed   By: Eddie Candle M.D.   On: 09/02/2020 13:52    Anti-infectives: Anti-infectives (From admission, onward)    Start     Dose/Rate Route Frequency Ordered Stop   09/01/20 1200  piperacillin-tazobactam (ZOSYN) IVPB 3.375 g        3.375 g 12.5 mL/hr over 240 Minutes Intravenous Every 8 hours 09/01/20 0604     09/01/20 0500  piperacillin-tazobactam (ZOSYN) IVPB 3.375 g        3.375 g 100 mL/hr over 30 Minutes Intravenous  Once 09/01/20 0456 09/01/20 0540        Assessment/Plan Diverticulitis - CT with Sigmoid diverticulitis in the same distribution as seen by CT in 2021. Phlegmon is noted along the anti mesenteric wall. Phlegmon/abscess on imaging does not appear amenable to drain.  - Cont  IV abx.  - Keep on sips/chips - Trend labs. WBC stable at 13.6. Tmax 100.4 - Hopefully will improve with conservative management. Discussed if he fails conservative management he may require colectomy/colostomy. Also discussed possibility of repeat CT scan later this weekend/early next week if he fails to improve to  determine if fluid collection will become more amenable to IR drainage.   - If he improves with conservative management, would recommended colonoscopy with GI in 6-8 weeks.  - We will follow with you    FEN: sips/chips, IVF ID: [id number] 8/29>> VTE: heparin gtt   Per primary team: Sepsis HTN PE   LOS: 2 days    Jillyn Ledger , [facility] 09/03/2020, 8:16 AM Please see Amion for pager number during day hours 7:00am-4:30pm

## 2020-09-03 NOTE — TOC Initial Note (Signed)
Transition of Care [facility]) - Initial/Assessment Note    Patient Details  Name: Raymond [name] MRN: [id number] Date of Birth: [birth year]  Transition of Care [facility]) CM/SW Contact:    Marilu Favre, RN Phone Number: 09/03/2020, 11:01 AM  Clinical Narrative:                 Spoke to patient at bedside. Confirmed face sheet information. Patient from home with wife .   PCP is DR Osei-Bonsu   Patient states he has [employer], however [employer] "says my numbers on my cards don't match up". Patient is working directly with [employer] to take care of the situation.   If patient discharges on Eliquis or xarelto NCM can provide 30 day free card and [facility] pharmacy can fill. PAtient voiced understanding.   Patient asking if MD can call his wife [phone] she would like to speak to MD. NCM secure chatted MD message.   NCM will continue to follow.  Expected Discharge Plan: Home/Self Care     Patient Goals and CMS Choice Patient states their goals for this hospitalization and ongoing recovery are:: to go home CMS Medicare.gov Compare Post Acute Care list provided to:: Patient    Expected Discharge Plan and Services Expected Discharge Plan: Home/Self Care   Discharge Planning Services: CM Consult   Living arrangements for the past 2 months: Apartment                   DME Agency: NA       HH Arranged: NA          Prior Living Arrangements/Services Living arrangements for the past 2 months: Apartment Lives with:: Spouse Patient language and need for interpreter reviewed:: Yes        Need for Family Participation in Patient Care: Yes (Comment) Care giver support system in place?: Yes (comment)   Criminal Activity/Legal Involvement Pertinent to Current Situation/Hospitalization: No - Comment as needed  Activities of Daily Living      Permission Sought/Granted   Permission granted to share information with : No              Emotional  Assessment Appearance:: Appears stated age Attitude/Demeanor/Rapport: Engaged Affect (typically observed): Accepting Orientation: : Oriented to Situation, Oriented to  Time, Oriented to Place, Oriented to Self Alcohol / Substance Use: Not Applicable Psych Involvement: No (comment)  Admission diagnosis:  Diverticulitis [K57.92] Acute diverticulitis [K57.92] Patient Active Problem List   Diagnosis Date Noted   Acute diverticulitis 09/01/2020   Hx of diverticulitis of colon 05/21/2019   Paraplegia, incomplete ([place]) 05/20/2019   Baker cyst 02/28/2015   Chest pain, exertional 02/28/2015   Baker's cyst 02/28/2015   Abnormal EKG 02/28/2015   MICROSCOPIC HEMATURIA 10/17/2007   ABDOMINAL PAIN 10/17/2007   HEMATURIA, HX OF 10/17/2007   PSYCHOLOGICAL STRESS 05/31/2007   Obesity, unspecified 04/19/2007   TOBACCO ABUSE 04/19/2007   RHINITIS, ALLERGIC NOS 04/19/2006   HYPERTENSION, BENIGN 03/09/2006   FATIGUE 03/09/2006   WEIGHT GAIN 03/09/2006   HEADACHE 03/09/2006   ANXIETY 03/02/2006   CHEST PAIN 03/02/2006   PCP:  Benito Mccreedy, MD Pharmacy:   [id number] [place] [address] Phone: [phone] Fax: [phone]  [place], [address] [place] [address] Phone: [phone] Fax: [phone]  [facility] [address] [address] Phone: [phone] Fax: [phone]     Social Determinants of Health (SDOH) Interventions    Readmission Risk Interventions No flowsheet data found.

## 2020-09-03 NOTE — Progress Notes (Signed)
ANTICOAGULATION CONSULT NOTE - Follow Up Consult  Pharmacy Consult for heparin Indication: pulmonary embolus  No Known Allergies  Patient Measurements: Height: 5\' 10"  (177.8 cm) Weight: 135.1 kg (297 lb 13.5 oz) IBW/kg (Calculated) : 73 Heparin Dosing Weight: 104 kg  Vital Signs: Temp: 98.2 F (36.8 C) (09/01 1601) Temp Source: Oral (09/01 1601) BP: 144/79 (09/01 1601) Pulse Rate: 87 (09/01 1601)  Labs: Recent Labs    09/01/20 0604 09/01/20 1824 09/01/20 2200 09/02/20 0018 09/02/20 2133 09/03/20 0552 09/03/20 1619  HGB 12.4*  --   --  13.0  --  12.1*  --   HCT 40.0  --   --  39.7  --  36.6*  --   PLT 254  --   --  226  --  230  --   HEPARINUNFRC  --   --   --   --  0.27* 0.28* 0.43  CREATININE 1.00  --   --  0.88  --  0.85  --   TROPONINIHS  --  9 11  --   --   --   --     Estimated Creatinine Clearance: 131 mL/min (by C-G formula based on SCr of 0.85 mg/dL).   Medications:  Scheduled:   amLODipine  10 mg Oral Daily   Infusions:   sodium chloride 125 mL/hr at 09/03/20 1542   heparin 2,150 Units/hr (09/03/20 1719)   piperacillin-tazobactam (ZOSYN)  IV 3.375 g (09/03/20 1313)    Assessment: 89 YOM presented with abdominal pain from sigmoid diverticulitis.  Found to have acute PE with concern for malignancy.  Pharmacy consulted for IV heparin.  Heparin level is therapeutic on 2150 units/hr.  Goal of Therapy:  Heparin level 0.3-0.7 units/ml Monitor platelets by anticoagulation protocol: Yes   Plan:  Continue heparin at 2150 units/hr. Confirmation heparin level with AM labs 9/2.  [employer], Pharm.D., BCPS Clinical Pharmacist **Pharmacist phone directory can be found on [email or web address] listed under [place].  09/03/2020 5:21 PM

## 2020-09-03 NOTE — Progress Notes (Signed)
ANTICOAGULATION CONSULT NOTE  Pharmacy Consult:  Heparin Indication: Acute PE  No Known Allergies  Patient Measurements: Height: 5\' 10"  (177.8 cm) Weight: 135.1 kg (297 lb 13.5 oz) IBW/kg (Calculated) : 73 Heparin Dosing Weight: 104 kg  Vital Signs: Temp: 99.5 F (37.5 C) (09/01 0347) Temp Source: Oral (09/01 0347) BP: 161/81 (09/01 0347) Pulse Rate: 101 (09/01 0347)  Labs: Recent Labs    09/01/20 0604 09/01/20 1824 09/01/20 2200 09/02/20 0018 09/02/20 2133 09/03/20 0552  HGB 12.4*  --   --  13.0  --  12.1*  HCT 40.0  --   --  39.7  --  36.6*  PLT 254  --   --  226  --  230  HEPARINUNFRC  --   --   --   --  0.27* 0.28*  CREATININE 1.00  --   --  0.88  --  0.85  TROPONINIHS  --  9 11  --   --   --      Estimated Creatinine Clearance: 131 mL/min (by C-G formula based on SCr of 0.85 mg/dL).   Assessment: 17 YOM presented with abdominal pain from sigmoid diverticulitis.  Found to have acute PE with concern for malignancy.  Pharmacy consulted for IV heparin.  Heparin level is slightly sub-therapeutic at 0.28 units/mL.  No complications per RN.  Goal of Therapy:  Heparin level 0.3-0.7 units/ml Monitor platelets by anticoagulation protocol: Yes   Plan:  Increase heparin infusion at 2150 units/hr Check 6 hr heparin level Daily heparin level and CBC  Rosamund Nyland D. Mina Marble, PharmD, BCPS, [place] 09/03/2020, 8:02 AM

## 2020-09-04 ENCOUNTER — Inpatient Hospital Stay (HOSPITAL_COMMUNITY): Payer: Self-pay

## 2020-09-04 DIAGNOSIS — I2699 Other pulmonary embolism without acute cor pulmonale: Secondary | ICD-10-CM

## 2020-09-04 DIAGNOSIS — R079 Chest pain, unspecified: Secondary | ICD-10-CM

## 2020-09-04 LAB — CBC WITH DIFFERENTIAL/PLATELET
Abs Immature Granulocytes: 0.04 10*3/uL (ref 0.00–0.07)
Basophils Absolute: 0 10*3/uL (ref 0.0–0.1)
Basophils Relative: 0 %
Eosinophils Absolute: 0.1 10*3/uL (ref 0.0–0.5)
Eosinophils Relative: 1 %
HCT: 39 % (ref 39.0–52.0)
Hemoglobin: 12.3 g/dL — ABNORMAL LOW (ref 13.0–17.0)
Immature Granulocytes: 0 %
Lymphocytes Relative: 11 %
Lymphs Abs: 1.5 10*3/uL (ref 0.7–4.0)
MCH: 28.3 pg (ref 26.0–34.0)
MCHC: 31.5 g/dL (ref 30.0–36.0)
MCV: 89.9 fL (ref 80.0–100.0)
Monocytes Absolute: 1.1 10*3/uL — ABNORMAL HIGH (ref 0.1–1.0)
Monocytes Relative: 8 %
Neutro Abs: 10.8 10*3/uL — ABNORMAL HIGH (ref 1.7–7.7)
Neutrophils Relative %: 80 %
Platelets: 254 10*3/uL (ref 150–400)
RBC: 4.34 MIL/uL (ref 4.22–5.81)
RDW: 13.2 % (ref 11.5–15.5)
WBC: 13.5 10*3/uL — ABNORMAL HIGH (ref 4.0–10.5)
nRBC: 0 % (ref 0.0–0.2)

## 2020-09-04 LAB — BASIC METABOLIC PANEL
Anion gap: 10 (ref 5–15)
BUN: 12 mg/dL (ref 6–20)
CO2: 22 mmol/L (ref 22–32)
Calcium: 8.6 mg/dL — ABNORMAL LOW (ref 8.9–10.3)
Chloride: 106 mmol/L (ref 98–111)
Creatinine, Ser: 0.82 mg/dL (ref 0.61–1.24)
GFR, Estimated: >60 mL/min (ref >60)
Glucose, Bld: 94 mg/dL (ref 70–99)
Potassium: 4 mmol/L (ref 3.5–5.1)
Sodium: 138 mmol/L (ref 135–145)

## 2020-09-04 LAB — ECHOCARDIOGRAM COMPLETE
Area-P 1/2: 3.6 cm2
Height: 70 in
S' Lateral: 3.3 cm
Weight: 4765.46 oz

## 2020-09-04 LAB — GLUCOSE, CAPILLARY
Glucose-Capillary: 102 mg/dL — ABNORMAL HIGH (ref 70–99)
Glucose-Capillary: 79 mg/dL (ref 70–99)
Glucose-Capillary: 94 mg/dL (ref 70–99)
Glucose-Capillary: 98 mg/dL (ref 70–99)

## 2020-09-04 LAB — MAGNESIUM: Magnesium: 2.2 mg/dL (ref 1.7–2.4)

## 2020-09-04 LAB — HEPARIN LEVEL (UNFRACTIONATED)
Heparin Unfractionated: 0.27 IU/mL — ABNORMAL LOW (ref 0.30–0.70)
Heparin Unfractionated: 0.66 IU/mL (ref 0.30–0.70)

## 2020-09-04 MED ORDER — ENOXAPARIN SODIUM 60 MG/0.6ML IJ SOSY
60.0000 mg | PREFILLED_SYRINGE | INTRAMUSCULAR | Status: DC
  Administered 2020-09-04 – 2020-09-05 (×2): 60 mg via SUBCUTANEOUS
  Filled 2020-09-04 (×2): qty 0.6

## 2020-09-04 MED ORDER — HEPARIN BOLUS VIA INFUSION
3000.0000 [IU] | Freq: Once | INTRAVENOUS | Status: AC
  Administered 2020-09-04: 3000 [IU] via INTRAVENOUS
  Filled 2020-09-04: qty 3000

## 2020-09-04 MED ORDER — IOHEXOL 350 MG/ML SOLN
75.0000 mL | Freq: Once | INTRAVENOUS | Status: AC | PRN
  Administered 2020-09-04: 75 mL via INTRAVENOUS

## 2020-09-04 NOTE — Progress Notes (Signed)
ANTICOAGULATION CONSULT NOTE - Follow Up Consult  Pharmacy Consult for heparin Indication: pulmonary embolus  No Known Allergies  Patient Measurements: Height: 5\' 10"  (177.8 cm) Weight: 135.1 kg (297 lb 13.5 oz) IBW/kg (Calculated) : 73 Heparin Dosing Weight: 104 kg  Vital Signs: Temp: 98.2 F (36.8 C) (09/02 1000) Temp Source: Oral (09/02 1000) BP: 160/76 (09/02 1059) Pulse Rate: 87 (09/02 1000)  Labs: Recent Labs     0000 09/01/20 1824 09/01/20 2200 09/02/20 0018 09/02/20 2133 09/03/20 0552 09/03/20 1619 09/04/20 0026 09/04/20 1046  HGB   < >  --   --  13.0  --  12.1*  --  12.3*  --   HCT  --   --   --  39.7  --  36.6*  --  39.0  --   PLT  --   --   --  226  --  230  --  254  --   HEPARINUNFRC  --   --   --   --    < > 0.28* 0.43 0.27* 0.66  CREATININE  --   --   --  0.88  --  0.85  --  0.82  --   TROPONINIHS  --  9 11  --   --   --   --   --   --    < > = values in this interval not displayed.     Estimated Creatinine Clearance: 135.8 mL/min (by C-G formula based on SCr of 0.82 mg/dL).   Medications:  Infusions:   sodium chloride 125 mL/hr at 09/04/20 0733   heparin 2,400 Units/hr (09/04/20 0418)   piperacillin-tazobactam (ZOSYN)  IV 3.375 g (09/04/20 0602)    Assessment: Raymond [name] presented with abdominal pain from sigmoid diverticulitis.  Found to have acute PE with concern for malignancy.  Pharmacy consulted for IV heparin.   Repeat CTA chest neg for PE, LE doppler pending. Heparin level is therapeutic at 0.66 on 2400 units/hr. No bleeding noted, CBC stable.  Goal of Therapy:  Heparin level 0.3-0.7 units/ml Monitor platelets by anticoagulation protocol: Yes   Plan:  Continue heparin at 2400 units/hr Confirmation heparin level at 16:00 Daily heparin level, CBC Monitor for s/sx of bleeding If LE doppler neg, consider changing to Lovenox for VTE prophylaxis  Thank you for involving pharmacy in this patient's care.  Renold Genta, PharmD,  BCPS Clinical Pharmacist Clinical phone for 09/04/2020 until 3p is [phone] 09/04/2020 12:11 PM  **Pharmacist phone directory can be found on [email or web address] listed under [place]**

## 2020-09-04 NOTE — Plan of Care (Signed)

## 2020-09-04 NOTE — Progress Notes (Signed)
ANTICOAGULATION CONSULT NOTE - Follow Up Consult  Pharmacy Consult for heparin Indication: pulmonary embolus  Labs: Recent Labs    09/01/20 1824 09/01/20 2200 09/02/20 0018 09/02/20 2133 09/03/20 0552 09/03/20 1619 09/04/20 0026  HGB  --   --  13.0  --  12.1*  --  12.3*  HCT  --   --  39.7  --  36.6*  --  39.0  PLT  --   --  226  --  230  --  254  HEPARINUNFRC  --   --   --    < > 0.28* 0.43 0.27*  CREATININE  --   --  0.88  --  0.85  --  0.82  TROPONINIHS 9 11  --   --   --   --   --    < > = values in this interval not displayed.     Assessment: 60yo male slightly subtherapeutic on heparin after one level at goal; no infusion issues or signs of bleeding per RN.  Goal of Therapy:  Heparin level 0.3-0.7 units/ml   Plan:  Will give small heparin bolus of 3000 units and increase heparin infusion by 2 units/kg/hr to 2400 units/hr and check level in 6 hours.    Wynona Neat, PharmD, BCPS  09/04/2020,2:06 AM

## 2020-09-04 NOTE — Progress Notes (Signed)
Lower extremity venous has been completed.   Preliminary results in CV Proc.   Raymond [name] 09/04/2020 3:04 PM

## 2020-09-04 NOTE — Progress Notes (Signed)
  Echocardiogram 2D Echocardiogram has been performed.  Raymond [name] 09/04/2020, 3:44 PM

## 2020-09-04 NOTE — Progress Notes (Signed)
Subjective: CC: Patient reports his pain has now settled in his LLQ and resolved elsewhere. Still a constant 8/10. Occasional nausea. No emesis. Passing flatus. No BM. Went down for a repeat CTA today per CCM. He is currently on sips/chips but reports no po intake yesterday.   Objective: Vital signs in last 24 hours: Temp:  [97.8 F (36.6 C)-98.9 F (37.2 C)] 98.9 F (37.2 C) (09/01 1944) Pulse Rate:  [87-96] 96 (09/01 2207) Resp:  [14-18] 18 (09/01 1944) BP: (137-168)/(73-79) 153/73 (09/01 2207) SpO2:  [90 %-93 %] 92 % (09/01 1944) Last BM Date: 08/29/20  Intake/Output from previous day: 09/01 0701 - 09/02 0700 In: 950 [I.V.:950] Out: 1975 [Urine:1975] Intake/Output this shift: No intake/output data recorded.  PE: Gen:  Alert, NAD, pleasant Heart: Reg Lungs: Normal rate and effort  Abd: Soft, mild distension, llq tenderness without peritonitis, +BS Psych: A&Ox3  Skin: no rashes noted, warm and dry  Lab Results:  Recent Labs    09/03/20 0552 09/04/20 0026  WBC 13.6* 13.5*  HGB 12.1* 12.3*  HCT 36.6* 39.0  PLT 230 254   BMET Recent Labs    09/03/20 0552 09/04/20 0026  NA 136 138  K 3.7 4.0  CL 105 106  CO2 24 22  GLUCOSE 101* 94  BUN 11 12  CREATININE 0.85 0.82  CALCIUM 8.4* 8.6*   PT/INR No results for input(s): LABPROT, INR in the last 72 hours. CMP     Component Value Date/Time   NA 138 09/04/2020 0026   K 4.0 09/04/2020 0026   CL 106 09/04/2020 0026   CO2 22 09/04/2020 0026   GLUCOSE 94 09/04/2020 0026   BUN 12 09/04/2020 0026   CREATININE 0.82 09/04/2020 0026   CALCIUM 8.6 (L) 09/04/2020 0026   PROT 6.9 08/31/2020 1516   ALBUMIN 3.4 (L) 08/31/2020 1516   ALBUMIN 4.0 05/20/2019 1951   AST 20 08/31/2020 1516   ALT 20 08/31/2020 1516   ALKPHOS 73 08/31/2020 1516   BILITOT 1.2 08/31/2020 1516   GFRNONAA >60 09/04/2020 0026   GFRAA >60 05/25/2019 0427   Lipase     Component Value Date/Time   LIPASE 21 08/31/2020 1516     Studies/Results: CT Angio Chest Pulmonary Embolism (PE) W or WO Contrast  Result Date: 09/04/2020 CLINICAL DATA:  Chest pain, high probability for pulmonary embolus, shortness of breath EXAM: CT ANGIOGRAPHY CHEST WITH CONTRAST TECHNIQUE: Multidetector CT imaging of the chest was performed using the standard protocol during bolus administration of intravenous contrast. Multiplanar CT image reconstructions and MIPs were obtained to evaluate the vascular anatomy. CONTRAST:  42mL OMNIPAQUE IOHEXOL 350 MG/ML SOLN COMPARISON:  09/02/2020 FINDINGS: Cardiovascular: Intact thoracic aorta. Negative for aneurysm or dissection. No mediastinal hemorrhage or hematoma. Patent 2 vessel arch anatomy. Central and proximal hilar pulmonary arteries are patent and normal in caliber. No significant central or proximal hilar PE. Limited assessment of the more peripheral small segmental branches to exclude small PE. Central venous structures are patent.  No veno-occlusive process. Normal heart size.  No pericardial effusion. Mediastinum/Nodes: Atrophic left thyroid. Otherwise thyroid unremarkable. Trachea central airways are patent. Esophagus nondilated. Similar mild prominent AP window lymph node measures 3.1 cm in length but only 1 cm in short axis. Mildly enlarged left hilar lymph node measures 2.2 x 1.1 cm, unchanged. These are indeterminate for early nodal metastases as previously described. No contralateral or subcarinal adenopathy. No supraclavicular or axillary adenopathy. Lungs/Pleura: Apical small subpleural blebs noted. Peripheral  left upper lobe masslike subpleural consolidation again measuring 4.1 x 3.4 cm. Adjacent atelectasis and or scarring noted. This is unchanged. Increased bibasilar atelectasis and new very small non loculated pleural effusions. Upper Abdomen: Cholelithiasis noted. Vicarious contrast excretion noted within the gallbladder from prior CT. No acute upper abdominal finding. Musculoskeletal:  Degenerative changes noted of the spine. No acute osseous finding. Sternum intact. Review of the MIP images confirms the above findings. IMPRESSION: Negative for significant acute central or proximal hilar pulmonary embolus. Limited assessment of the smaller peripheral segmental branches to exclude small PE. Persistent 4.1 cm left upper lobe lobulated masslike consolidation with adjacent AP window and left hilar mild adenopathy concerning for primary lung malignancy and early nodal disease. Increased bibasilar atelectasis and new developing trace pleural effusions. Cholelithiasis Electronically Signed   By: Jerilynn Mages.  Shick M.D.   On: 09/04/2020 09:37   CT Angio Chest Pulmonary Embolism (PE) W or WO Contrast  Result Date: 09/02/2020 CLINICAL DATA:  Shortness of breath, rule out PE EXAM: CT ANGIOGRAPHY CHEST WITH CONTRAST TECHNIQUE: Multidetector CT imaging of the chest was performed using the standard protocol during bolus administration of intravenous contrast. Multiplanar CT image reconstructions and MIPs were obtained to evaluate the vascular anatomy. CONTRAST:  89mL OMNIPAQUE IOHEXOL 350 MG/ML SOLN COMPARISON:  Chest radiograph, 05/20/2019 FINDINGS: Cardiovascular: Examination for pulmonary embolism is substantially limited by marginal contrast bolus, main pulmonary artery = 180 HU, as well as breath motion artifact. Within this limitation, findings are suspicious for segmental to subsegmental embolus in the lower lobes (series 1, image 75). Mild cardiomegaly. No pericardial effusion. Mediastinum/Nodes: Enlarged left hilar and AP window lymph nodes, measuring up to 2.3 x 1.5 cm (series 1, image 44). Thyroid gland, trachea, and esophagus demonstrate no significant findings. Lungs/Pleura: There is a lobulated masslike consolidation of the subpleural left upper lobe measuring 4.1 x 3.4 cm (series 4, image 37). There is a more wedge-shaped, heterogeneous opacity of the dependent left lower lobe; suspect that this is  distal to embolus (series 4, image 9). Trace bilateral pleural effusions. Upper Abdomen: Please see forthcoming dedicated examination of abdomen and pelvis. Musculoskeletal: No chest wall abnormality. No acute or significant osseous findings. Review of the MIP images confirms the above findings. IMPRESSION: 1. Examination for pulmonary embolism is substantially limited by marginal contrast bolus and breath motion artifact. Within this limitation, findings are suspicious for segmental to subsegmental embolus in the lower lobes. Consider technical repeat examination to further evaluate. 2. There is a lobulated, masslike consolidation of the subpleural left upper lobe measuring 4.1 x 3.4 cm, highly concerning for primary lung malignancy. 3. Enlarged left hilar and AP window nodes, concerning for nodal metastatic disease. 4. There is a more wedge-shaped, heterogeneous opacity of the dependent left lower lobe; suspect that this is distal to embolus. This is concerning for pulmonary infarction. 5. Trace bilateral pleural effusions. These results will be called to the ordering clinician or representative by the Radiologist Assistant, and communication documented in the PACS or [employer]. Electronically Signed   By: Eddie Candle M.D.   On: 09/02/2020 13:47   CT ABDOMEN PELVIS W CONTRAST  Result Date: 09/02/2020 CLINICAL DATA:  Diverticulitis, complication suspected EXAM: CT ABDOMEN AND PELVIS WITH CONTRAST TECHNIQUE: Multidetector CT imaging of the abdomen and pelvis was performed using the standard protocol following bolus administration of intravenous contrast. CONTRAST:  87mL OMNIPAQUE IOHEXOL 350 MG/ML SOLN COMPARISON:  CT abdomen pelvis, 09/01/2020 FINDINGS: Lower chest: Please see separately dictated examination of the chest.  Hepatobiliary: No solid liver abnormality is seen. Faintly calcified gallstones in the gallbladder. No gallbladder wall thickening, or biliary dilatation. Pancreas: Unremarkable. No  pancreatic ductal dilatation or surrounding inflammatory changes. Spleen: Normal in size without significant abnormality. Adrenals/Urinary Tract: Adrenal glands are unremarkable. Kidneys are normal, without renal calculi, solid lesion, or hydronephrosis. Bladder is unremarkable. Stomach/Bowel: Stomach is within normal limits. Appendix appears normal. Sigmoid diverticulosis. No significant change in severe wall thickening fat stranding about the distal descending and proximal mid sigmoid colon (series 5, image 78). There remains a low-attenuation fluid collection at the left aspect of the proximal sigmoid measuring approximately 3.4 x 2.2 cm (series 5, image 78, series 8, image 80). Vascular/Lymphatic: Aortic atherosclerosis. No enlarged abdominal or pelvic lymph nodes. Reproductive: No mass or other significant abnormality. Other: No abdominal wall hernia or abnormality. No abdominopelvic ascites. Musculoskeletal: No acute or significant osseous findings. IMPRESSION: 1. No significant change in severe wall thickening fat and stranding about the distal descending and proximal mid sigmoid colon, consistent with acute diverticulitis. 2. There remains a low-attenuation fluid collection at the left aspect of the proximal sigmoid measuring approximately 3.4 x 2.2 cm, consistent with phlegmon or abscess. 3. Cholelithiasis. Aortic Atherosclerosis (ICD10-I70.0). Electronically Signed   By: Eddie Candle M.D.   On: 09/02/2020 13:52    Anti-infectives: Anti-infectives (From admission, onward)    Start     Dose/Rate Route Frequency Ordered Stop   09/01/20 1200  piperacillin-tazobactam (ZOSYN) IVPB 3.375 g        3.375 g 12.5 mL/hr over 240 Minutes Intravenous Every 8 hours 09/01/20 0604     09/01/20 0500  piperacillin-tazobactam (ZOSYN) IVPB 3.375 g        3.375 g 100 mL/hr over 30 Minutes Intravenous  Once 09/01/20 0456 09/01/20 0540        Assessment/Plan Diverticulitis - CT with Sigmoid diverticulitis in  the same distribution as seen by CT in 2021. Phlegmon is noted along the anti mesenteric wall. Phlegmon/abscess on imaging does not appear amenable to drain.  - Cont IV abx.  - Keep on sips/chips - Trend labs. WBC stable  - Consider repeat CT scan this weekend to determine if he has developed anything drainable by IR.  - Hopefully will improve with conservative management. Discussed if he fails conservative management he may require colectomy/colostomy.   - If he improves with conservative management, would recommended colonoscopy with GI in 6-8 weeks.  - We will follow with you    FEN: sips/chips, IVF ID: [email or web address] 8/29>> VTE: heparin gtt currently    Per primary team: HTN L upper lobe mass - CCM following to schedule outpatient bronch  Suspected PE - repeat CTA 9/2 negative for PE, defer to CCM/TRH. LE [place] pending    LOS: 3 days    Jillyn Ledger , [facility] Surgery 09/04/2020, 10:17 AM Please see Amion for pager number during day hours 7:00am-4:30pm

## 2020-09-04 NOTE — Progress Notes (Signed)
PROGRESS NOTE    Raymond [name]  GEX:[id number] DOB: 12-04-1960 DOA: 08/31/2020 PCP: Benito Mccreedy, MD   Brief Narrative:  60 year old gentleman with a history of hypertension was admitted  with sigmoid diverticulitis, he also has a history of sigmoid diverticulitis in May 2021.  He was started on Zosyn.  Questionable abscess/phlegmon around sigmoid diverticulitis.  General surgery on board but they recommend conservative management with IV antibiotics.  Unfortunately, he was also diagnosed with new lung mass and bilateral PE.  Started on heparin.  PCCM consulted.  Assessment & Plan:   Principal Problem:   Acute diverticulitis Active Problems:   HYPERTENSION, BENIGN  Sepsis secondary to Acute sigmoidal diverticulitis with phlegmon but no abscess or pneumoperitoneum: Sepsis was not present at the admission.  Met sepsis criteria after admission based on fever 102, tachycardia and leukocytosis.  Lactic acid normal.  Repeat CT abdomen with contrast suspicious for possible phlegmon or abscess.  General surgery consulted, per them, it is likely phlegmon.  They recommend continuing antibiotics with conservative management and no surgical procedure at this point in time.  Patient is improving.  No more fever since last 24 hours.  Pain 4 out of 10.  Started on some clears today.  Surgery plans to repeat CT scan over the weekend.  Appreciate their help.  Essential hypertension: Appears to be taking losartan and hydrochlorothiazide at home however he ran out of the medications a week before admission so he was not taking them.  Blood pressure started to creep up, he was started on amlodipine 10 mg p.o. daily on 09/02/2020.  Blood pressure fairly stable.  Continue that and as needed hydralazine.  Chest pain/bilateral PE: Initial CT angiogram of the chest was suspecting PE.  Patient was started on heparin drip.  Repeat CT angiogram of the chest today does not show large PE.  Doppler lower extremity as  well as echo ordered.  We will continue heparin until DVT ruled out.  New lung mass: CT angiogram of the chest also shows new lung mass.  PCCM consulted.  CCM plans to do EUS/biopsy as outpatient.  DVT prophylaxis:    Code Status: Full Code  Family Communication:  None present at bedside.    Status is: Inpatient  Remains inpatient appropriate because:IV treatments appropriate due to intensity of illness or inability to take PO  Dispo: The patient is from: Home              Anticipated d/c is to: Home              Patient currently is not medically stable to d/c.   Difficult to place patient No        Estimated body mass index is 42.74 kg/m as calculated from the following:   Height as of this encounter: $RemoveBeforeD'5\' 10"'[id number]$  (1.778 m).   Weight as of this encounter: 135.1 kg.     Nutritional Assessment: Body mass index is 42.74 kg/m.Marland Kitchen Seen by dietician.  I agree with the assessment and plan as outlined below: Nutrition Status:        .  Skin Assessment: I have examined the patient's skin and I agree with the wound assessment as performed by the wound care RN as outlined below:    Consultants:  PCCM General surgery  Procedures:  None  Antimicrobials:  Anti-infectives (From admission, onward)    Start     Dose/Rate Route Frequency Ordered Stop   09/01/20 1200  piperacillin-tazobactam (ZOSYN) IVPB 3.375 g  3.375 g 12.5 mL/hr over 240 Minutes Intravenous Every 8 hours 09/01/20 0604     09/01/20 0500  piperacillin-tazobactam (ZOSYN) IVPB 3.375 g        3.375 g 100 mL/hr over 30 Minutes Intravenous  Once 09/01/20 0456 09/01/20 0540          Subjective: Patient seen and examined.  He feels better than yesterday.  Abdominal pain is down to 4 out of 10.  No other complaint.  Objective: Vitals:   09/03/20 1944 09/03/20 2207 09/04/20 1000 09/04/20 1059  BP: (!) 168/75 (!) 153/73  (!) 160/76  Pulse: 88 96 87   Resp: 18  18   Temp: 98.9 F (37.2 C)  98.2 F  (36.8 C)   TempSrc: Oral  Oral   SpO2: 92%  96%   Weight:      Height:        Intake/Output Summary (Last 24 hours) at 09/04/2020 1309 Last data filed at 09/04/2020 1000 Gross per 24 hour  Intake 950 ml  Output 1725 ml  Net -775 ml    Filed Weights   09/01/20 2141  Weight: 135.1 kg    Examination:  General exam: Appears calm and comfortable, obese Respiratory system: Clear to auscultation. Respiratory effort normal. Cardiovascular system: S1 & S2 heard, RRR. No JVD, murmurs, rubs, gallops or clicks. No pedal edema. Gastrointestinal system: Abdomen is nondistended, soft and tender at left lower quadrant and periumbilical area. No organomegaly or masses felt. Normal bowel sounds heard. Central nervous system: Alert and oriented. No focal neurological deficits. Extremities: Symmetric 5 x 5 power. Skin: No rashes, lesions or ulcers.  Psychiatry: Judgement and insight appear normal. Mood & affect appropriate.   Data Reviewed: I have personally reviewed following labs and imaging studies  CBC: Recent Labs  Lab 08/31/20 1516 09/01/20 0604 09/02/20 0018 09/03/20 0552 09/04/20 0026  WBC 15.5* 15.3* 13.6* 13.6* 13.5*  NEUTROABS 12.5*  --   --  10.2* 10.8*  HGB 14.3 12.4* 13.0 12.1* 12.3*  HCT 44.6 40.0 39.7 36.6* 39.0  MCV 88.7 90.5 88.0 86.9 89.9  PLT 286 254 226 230 637    Basic Metabolic Panel: Recent Labs  Lab 08/31/20 1516 09/01/20 0604 09/02/20 0018 09/03/20 0552 09/04/20 0026  NA 139  --  136 136 138  K 4.0  --  3.7 3.7 4.0  CL 106  --  104 105 106  CO2 26  --  $R'24 24 22  'nh$ GLUCOSE 106*  --  95 101* 94  BUN 11  --  $R'13 11 12  'Ri$ CREATININE 0.82 1.00 0.88 0.85 0.82  CALCIUM 8.9  --  8.5* 8.4* 8.6*  MG  --   --   --  2.0 2.2    GFR: Estimated Creatinine Clearance: 135.8 mL/min (by C-G formula based on SCr of 0.82 mg/dL). Liver Function Tests: Recent Labs  Lab 08/31/20 1516  AST 20  ALT 20  ALKPHOS 73  BILITOT 1.2  PROT 6.9  ALBUMIN 3.4*    Recent  Labs  Lab 08/31/20 1516  LIPASE 21    No results for input(s): AMMONIA in the last 168 hours. Coagulation Profile: No results for input(s): INR, PROTIME in the last 168 hours. Cardiac Enzymes: No results for input(s): CKTOTAL, CKMB, CKMBINDEX, TROPONINI in the last 168 hours. BNP (last 3 results) No results for input(s): PROBNP in the last 8760 hours. HbA1C: No results for input(s): HGBA1C in the last 72 hours. CBG: Recent Labs  Lab 09/02/20 1651  09/02/20 2358 09/03/20 0816 09/04/20 0010 09/04/20 0635  GLUCAP 86 93 108* 98 102*    Lipid Profile: No results for input(s): CHOL, HDL, LDLCALC, TRIG, CHOLHDL, LDLDIRECT in the last 72 hours. Thyroid Function Tests: No results for input(s): TSH, T4TOTAL, FREET4, T3FREE, THYROIDAB in the last 72 hours. Anemia Panel: No results for input(s): VITAMINB12, FOLATE, FERRITIN, TIBC, IRON, RETICCTPCT in the last 72 hours. Sepsis Labs: Recent Labs  Lab 09/01/20 1824 09/01/20 2200  LATICACIDVEN 1.2 1.6     Recent Results (from the past 240 hour(s))  Resp Panel by RT-PCR (Flu A&B, Covid) Nasopharyngeal Swab     Status: None   Collection Time: 09/01/20  5:00 AM   Specimen: Nasopharyngeal Swab; Nasopharyngeal(NP) swabs in vial transport medium  Result Value Ref Range Status   SARS Coronavirus 2 by RT PCR NEGATIVE NEGATIVE Final    Comment: (NOTE) SARS-CoV-2 target nucleic acids are NOT DETECTED.  The SARS-CoV-2 RNA is generally detectable in upper respiratory specimens during the acute phase of infection. The lowest concentration of SARS-CoV-2 viral copies this assay can detect is 138 copies/mL. A negative result does not preclude SARS-Cov-2 infection and should not be used as the sole basis for treatment or other patient management decisions. A negative result may occur with  improper specimen collection/handling, submission of specimen other than nasopharyngeal swab, presence of viral mutation(s) within the areas targeted by  this assay, and inadequate number of viral copies(<138 copies/mL). A negative result must be combined with clinical observations, patient history, and epidemiological information. The expected result is Negative.  Fact Sheet for Patients:  EntrepreneurPulse.com.au  Fact Sheet for Healthcare Providers:  IncredibleEmployment.be  This test is no t yet approved or cleared by the [place] FDA and  has been authorized for detection and/or diagnosis of SARS-CoV-2 by FDA under an Emergency Use Authorization (EUA). This EUA will remain  in effect (meaning this test can be used) for the duration of the COVID-19 declaration under Section 564(b)(1) of the Act, 21 U.S.C.section 360bbb-3(b)(1), unless the authorization is terminated  or revoked sooner.       Influenza A by PCR NEGATIVE NEGATIVE Final   Influenza B by PCR NEGATIVE NEGATIVE Final    Comment: (NOTE) The Xpert Xpress SARS-CoV-2/FLU/RSV plus assay is intended as an aid in the diagnosis of influenza from Nasopharyngeal swab specimens and should not be used as a sole basis for treatment. Nasal washings and aspirates are unacceptable for Xpert Xpress SARS-CoV-2/FLU/RSV testing.  Fact Sheet for Patients: EntrepreneurPulse.com.au  Fact Sheet for Healthcare Providers: IncredibleEmployment.be  This test is not yet approved or cleared by the [place] FDA and has been authorized for detection and/or diagnosis of SARS-CoV-2 by FDA under an Emergency Use Authorization (EUA). This EUA will remain in effect (meaning this test can be used) for the duration of the COVID-19 declaration under Section 564(b)(1) of the Act, 21 U.S.C. section 360bbb-3(b)(1), unless the authorization is terminated or revoked.  Performed at [facility], [place] [address], [place], [place] [address]        Radiology Studies: CT Angio Chest Pulmonary Embolism (PE) W or WO  Contrast  Result Date: 09/04/2020 CLINICAL DATA:  Chest pain, high probability for pulmonary embolus, shortness of breath EXAM: CT ANGIOGRAPHY CHEST WITH CONTRAST TECHNIQUE: Multidetector CT imaging of the chest was performed using the standard protocol during bolus administration of intravenous contrast. Multiplanar CT image reconstructions and MIPs were obtained to evaluate the vascular anatomy. CONTRAST:  36mL OMNIPAQUE IOHEXOL 350  MG/ML SOLN COMPARISON:  09/02/2020 FINDINGS: Cardiovascular: Intact thoracic aorta. Negative for aneurysm or dissection. No mediastinal hemorrhage or hematoma. Patent 2 vessel arch anatomy. Central and proximal hilar pulmonary arteries are patent and normal in caliber. No significant central or proximal hilar PE. Limited assessment of the more peripheral small segmental branches to exclude small PE. Central venous structures are patent.  No veno-occlusive process. Normal heart size.  No pericardial effusion. Mediastinum/Nodes: Atrophic left thyroid. Otherwise thyroid unremarkable. Trachea central airways are patent. Esophagus nondilated. Similar mild prominent AP window lymph node measures 3.1 cm in length but only 1 cm in short axis. Mildly enlarged left hilar lymph node measures 2.2 x 1.1 cm, unchanged. These are indeterminate for early nodal metastases as previously described. No contralateral or subcarinal adenopathy. No supraclavicular or axillary adenopathy. Lungs/Pleura: Apical small subpleural blebs noted. Peripheral left upper lobe masslike subpleural consolidation again measuring 4.1 x 3.4 cm. Adjacent atelectasis and or scarring noted. This is unchanged. Increased bibasilar atelectasis and new very small non loculated pleural effusions. Upper Abdomen: Cholelithiasis noted. Vicarious contrast excretion noted within the gallbladder from prior CT. No acute upper abdominal finding. Musculoskeletal: Degenerative changes noted of the spine. No acute osseous finding. Sternum  intact. Review of the MIP images confirms the above findings. IMPRESSION: Negative for significant acute central or proximal hilar pulmonary embolus. Limited assessment of the smaller peripheral segmental branches to exclude small PE. Persistent 4.1 cm left upper lobe lobulated masslike consolidation with adjacent AP window and left hilar mild adenopathy concerning for primary lung malignancy and early nodal disease. Increased bibasilar atelectasis and new developing trace pleural effusions. Cholelithiasis Electronically Signed   By: Jerilynn Mages.  Shick M.D.   On: 09/04/2020 09:37   CT Angio Chest Pulmonary Embolism (PE) W or WO Contrast  Result Date: 09/02/2020 CLINICAL DATA:  Shortness of breath, rule out PE EXAM: CT ANGIOGRAPHY CHEST WITH CONTRAST TECHNIQUE: Multidetector CT imaging of the chest was performed using the standard protocol during bolus administration of intravenous contrast. Multiplanar CT image reconstructions and MIPs were obtained to evaluate the vascular anatomy. CONTRAST:  15mL OMNIPAQUE IOHEXOL 350 MG/ML SOLN COMPARISON:  Chest radiograph, 05/20/2019 FINDINGS: Cardiovascular: Examination for pulmonary embolism is substantially limited by marginal contrast bolus, main pulmonary artery = 180 HU, as well as breath motion artifact. Within this limitation, findings are suspicious for segmental to subsegmental embolus in the lower lobes (series 1, image 75). Mild cardiomegaly. No pericardial effusion. Mediastinum/Nodes: Enlarged left hilar and AP window lymph nodes, measuring up to 2.3 x 1.5 cm (series 1, image 44). Thyroid gland, trachea, and esophagus demonstrate no significant findings. Lungs/Pleura: There is a lobulated masslike consolidation of the subpleural left upper lobe measuring 4.1 x 3.4 cm (series 4, image 37). There is a more wedge-shaped, heterogeneous opacity of the dependent left lower lobe; suspect that this is distal to embolus (series 4, image 9). Trace bilateral pleural effusions.  Upper Abdomen: Please see forthcoming dedicated examination of abdomen and pelvis. Musculoskeletal: No chest wall abnormality. No acute or significant osseous findings. Review of the MIP images confirms the above findings. IMPRESSION: 1. Examination for pulmonary embolism is substantially limited by marginal contrast bolus and breath motion artifact. Within this limitation, findings are suspicious for segmental to subsegmental embolus in the lower lobes. Consider technical repeat examination to further evaluate. 2. There is a lobulated, masslike consolidation of the subpleural left upper lobe measuring 4.1 x 3.4 cm, highly concerning for primary lung malignancy. 3. Enlarged left hilar and AP window nodes,  concerning for nodal metastatic disease. 4. There is a more wedge-shaped, heterogeneous opacity of the dependent left lower lobe; suspect that this is distal to embolus. This is concerning for pulmonary infarction. 5. Trace bilateral pleural effusions. These results will be called to the ordering clinician or representative by the Radiologist Assistant, and communication documented in the PACS or [employer]. Electronically Signed   By: Eddie Candle M.D.   On: 09/02/2020 13:47   CT ABDOMEN PELVIS W CONTRAST  Result Date: 09/02/2020 CLINICAL DATA:  Diverticulitis, complication suspected EXAM: CT ABDOMEN AND PELVIS WITH CONTRAST TECHNIQUE: Multidetector CT imaging of the abdomen and pelvis was performed using the standard protocol following bolus administration of intravenous contrast. CONTRAST:  7mL OMNIPAQUE IOHEXOL 350 MG/ML SOLN COMPARISON:  CT abdomen pelvis, 09/01/2020 FINDINGS: Lower chest: Please see separately dictated examination of the chest. Hepatobiliary: No solid liver abnormality is seen. Faintly calcified gallstones in the gallbladder. No gallbladder wall thickening, or biliary dilatation. Pancreas: Unremarkable. No pancreatic ductal dilatation or surrounding inflammatory changes. Spleen:  Normal in size without significant abnormality. Adrenals/Urinary Tract: Adrenal glands are unremarkable. Kidneys are normal, without renal calculi, solid lesion, or hydronephrosis. Bladder is unremarkable. Stomach/Bowel: Stomach is within normal limits. Appendix appears normal. Sigmoid diverticulosis. No significant change in severe wall thickening fat stranding about the distal descending and proximal mid sigmoid colon (series 5, image 78). There remains a low-attenuation fluid collection at the left aspect of the proximal sigmoid measuring approximately 3.4 x 2.2 cm (series 5, image 78, series 8, image 80). Vascular/Lymphatic: Aortic atherosclerosis. No enlarged abdominal or pelvic lymph nodes. Reproductive: No mass or other significant abnormality. Other: No abdominal wall hernia or abnormality. No abdominopelvic ascites. Musculoskeletal: No acute or significant osseous findings. IMPRESSION: 1. No significant change in severe wall thickening fat and stranding about the distal descending and proximal mid sigmoid colon, consistent with acute diverticulitis. 2. There remains a low-attenuation fluid collection at the left aspect of the proximal sigmoid measuring approximately 3.4 x 2.2 cm, consistent with phlegmon or abscess. 3. Cholelithiasis. Aortic Atherosclerosis (ICD10-I70.0). Electronically Signed   By: Eddie Candle M.D.   On: 09/02/2020 13:52    Scheduled Meds:  amLODipine  10 mg Oral Daily   Continuous Infusions:  sodium chloride 125 mL/hr at 09/04/20 0733   heparin 2,400 Units/hr (09/04/20 0418)   piperacillin-tazobactam (ZOSYN)  IV 3.375 g (09/04/20 0602)     LOS: 3 days   Time spent: 30 minutes   Darliss Cheney, MD [facility]  09/04/2020, 1:09 PM   How to contact the [facility] Attending or Consulting provider [place] or covering provider during after hours [place], for this patient?  Check the care team in [facility] and look for a) attending/consulting [facility] provider listed and b) the [facility] team  listed. Page or secure chat [phone]. Log into [email or web address] and use [place]'s universal password to access. If you do not have the password, please contact the hospital operator. Locate the [facility] provider you are looking for under [facility] and page to a number that you can be directly reached. If you still have difficulty reaching the provider, please page the [facility] (Director on Call) for the Hospitalists listed on amion for assistance.

## 2020-09-04 NOTE — Progress Notes (Signed)
NAME:  Raymond [name], MRN:  [id number], DOB:  May 27, 1960, LOS: 3 ADMISSION DATE:  08/31/2020, CONSULTATION DATE:  8/31 REFERRING MD:  Doristine Bosworth, CHIEF COMPLAINT:  Abdominal pain   History of Present Illness:  60 y/o male admitted with left lower quadrant pain of several days who was found after admission to have a left upper lobe lung mass.  Pulmonary and critical care medicine was consulted for evaluation of the lung mass..  The patient says that he has smoked 1/2 pack of cigarettes daily for the last 40 years and continues to smoke.  He has never been diagnosed with a malignancy in the past such as colon cancer skin cancer etc.  Says his mother died of lung cancer.  He has never been told that he had a lung problem in the past.  He denies shortness of breath or cough or mucus production.  He was admitted to this facility in the setting of left lower quadrant pain and was found to have diverticulitis.  However, after presenting to the emergency department he said that he developed acute onset chest pain which was evaluated with an EKG and blood work and then eventually a CT scan of his chest.  This did not definitively show a blood clot there was concern for that however in the left lower lobe.  In addition he was noted to have a left upper lobe pleural-based mass.  There is hilar and AP window node adenopathy.  Pertinent  Medical History  Hypertension Diverticulitis Hypercholesterolemia Cigarette smoker, ongoing use  Significant Hospital Events: Including procedures, antibiotic start and stop dates in addition to other pertinent events   August 30 admission   Interim History / Subjective:   Repeat CTA Chest does not indicate pulmonary emboli. Lower extremity doppler pending for today Patient is feeling better today, continues to have left lower quadrant pain.   Objective   Blood pressure (!) 160/76, pulse 87, temperature 98.2 F (36.8 C), temperature source Oral, resp. rate 18,  height 5\' 10"  (1.778 m), weight 135.1 kg, SpO2 96 %.        Intake/Output Summary (Last 24 hours) at 09/04/2020 1119 Last data filed at 09/04/2020 1000 Gross per 24 hour  Intake 950 ml  Output 1725 ml  Net -775 ml   Filed Weights   09/01/20 2141  Weight: 135.1 kg    Examination:  General:  no acute distress, laying in bed HENT: NCAT, sclera anicteric, moist mucous membranes PULM: CTA Bilaterally, normal effort CV: RRR, no mgr GI: BS+, soft, tender left lower quadrant MSK: normal bulk and tone Neuro: awake, alert, no distress, MAEW   Resolved Hospital Problem list     Assessment & Plan:  Left Upper Lobe Lung Mass Mediastinal adenopathy   - Awaiting lower extremity dopplers before safe to stop heparin drip. No pulmonary emboli noted on repeat CTA chest. - He would benefit from navigational bronchoscopy for further evaluation of the left upper lobe lung mass. Case discussed with Dr. Lamonte Sakai who will plan to see the patient on 9/3 to schedule procedure as an outpatient once recovered from his abdominal infection - PCCM will continue to follow   Best Practice (right click and "Reselect all SmartList Selections" daily)   Per [facility]  Labs   CBC: Recent Labs  Lab 08/31/20 1516 09/01/20 0604 09/02/20 0018 09/03/20 0552 09/04/20 0026  WBC 15.5* 15.3* 13.6* 13.6* 13.5*  NEUTROABS 12.5*  --   --  10.2* 10.8*  HGB 14.3 12.4* 13.0 12.1*  12.3*  HCT 44.6 40.0 39.7 36.6* 39.0  MCV 88.7 90.5 88.0 86.9 89.9  PLT 286 254 226 230 322    Basic Metabolic Panel: Recent Labs  Lab 08/31/20 1516 09/01/20 0604 09/02/20 0018 09/03/20 0552 09/04/20 0026  NA 139  --  136 136 138  K 4.0  --  3.7 3.7 4.0  CL 106  --  104 105 106  CO2 26  --  24 24 22   GLUCOSE 106*  --  95 101* 94  BUN 11  --  13 11 12   CREATININE 0.82 1.00 0.88 0.85 0.82  CALCIUM 8.9  --  8.5* 8.4* 8.6*  MG  --   --   --  2.0 2.2   GFR: Estimated Creatinine Clearance: 135.8 mL/min (by C-G formula based on SCr of  0.82 mg/dL). Recent Labs  Lab 09/01/20 0604 09/01/20 1824 09/01/20 2200 09/02/20 0018 09/03/20 0552 09/04/20 0026  WBC 15.3*  --   --  13.6* 13.6* 13.5*  LATICACIDVEN  --  1.2 1.6  --   --   --     Liver Function Tests: Recent Labs  Lab 08/31/20 1516  AST 20  ALT 20  ALKPHOS 73  BILITOT 1.2  PROT 6.9  ALBUMIN 3.4*   Recent Labs  Lab 08/31/20 1516  LIPASE 21   No results for input(s): AMMONIA in the last 168 hours.  ABG    Component Value Date/Time   TCO2 30 05/20/2019 1941     Coagulation Profile: No results for input(s): INR, PROTIME in the last 168 hours.  Cardiac Enzymes: No results for input(s): CKTOTAL, CKMB, CKMBINDEX, TROPONINI in the last 168 hours.  HbA1C: Hgb A1c MFr Bld  Date/Time Value Ref Range Status  05/21/2019 10:33 AM 5.8 (H) 4.8 - 5.6 % Final    Comment:    (NOTE)         Prediabetes: 5.7 - 6.4         Diabetes: >6.4         Glycemic control for adults with diabetes: <7.0   03/01/2015 02:13 AM 5.7 (H) 4.8 - 5.6 % Final    Comment:    (NOTE)         Pre-diabetes: 5.7 - 6.4         Diabetes: >6.4         Glycemic control for adults with diabetes: <7.0     CBG: Recent Labs  Lab 09/02/20 1651 09/02/20 2358 09/03/20 0816 09/04/20 0010 09/04/20 0635  GLUCAP 86 93 108* 98 102*    Critical care time: n/a     Freda Jackson, MD [facility] Office: [phone]   See Amion for personal pager PCCM on call pager [phone] until 7pm. Please call Elink 7p-7a. [phone]

## 2020-09-05 ENCOUNTER — Other Ambulatory Visit: Payer: Self-pay | Admitting: Emergency Medicine

## 2020-09-05 DIAGNOSIS — R918 Other nonspecific abnormal finding of lung field: Secondary | ICD-10-CM

## 2020-09-05 LAB — GLUCOSE, CAPILLARY
Glucose-Capillary: 91 mg/dL (ref 70–99)
Glucose-Capillary: 87 mg/dL (ref 70–99)

## 2020-09-05 NOTE — Progress Notes (Signed)
Will work on arranging outpatient Robotic-assisted navigational FOB + EBUS

## 2020-09-05 NOTE — H&P (View-Only) (Signed)
NAME:  Raymond [name], MRN:  [id number], DOB:  [birth year], LOS: 4 ADMISSION DATE:  08/31/2020, CONSULTATION DATE:  8/31 REFERRING MD:  Doristine Bosworth, CHIEF COMPLAINT:  Abdominal pain   History of Present Illness:  60 y/o male admitted with left lower quadrant pain of several days who was found after admission to have a left upper lobe lung mass.  Pulmonary and critical care medicine was consulted for evaluation of the lung mass..  The patient says that he has smoked 1/2 pack of cigarettes daily for the last 40 years and continues to smoke.  He has never been diagnosed with a malignancy in the past such as colon cancer skin cancer etc.  Says his mother died of lung cancer.  He has never been told that he had a lung problem in the past.  He denies shortness of breath or cough or mucus production.  He was admitted to this facility in the setting of left lower quadrant pain and was found to have diverticulitis.  However, after presenting to the emergency department he said that he developed acute onset chest pain which was evaluated with an EKG and blood work and then eventually a CT scan of his chest.  This did not definitively show a blood clot there was concern for that however in the left lower lobe.  In addition he was noted to have a left upper lobe pleural-based mass.  There is hilar and AP window node adenopathy.  Pertinent  Medical History  Hypertension Diverticulitis Hypercholesterolemia Cigarette smoker, ongoing use  Significant Hospital Events: Including procedures, antibiotic start and stop dates in addition to other pertinent events   August 30 admission Repeat CT-PA 9/2 without any clear evidence of pulmonary embolism Lower extremity Dopplers 9/2 without any DVT   Interim History / Subjective:      Objective   Blood pressure (!) 161/83, pulse 74, temperature 98.7 F (37.1 C), temperature source Oral, resp. rate 17, height 5\' 10"  (1.778 m), weight 135.1 kg, SpO2 94 %.         Intake/Output Summary (Last 24 hours) at 09/05/2020 0910 Last data filed at 09/05/2020 0600 Gross per 24 hour  Intake 120 ml  Output 1500 ml  Net -1380 ml   Filed Weights   09/01/20 2141  Weight: 135.1 kg    Examination:  General:  up to chair, NAD HENT: Op clear, no stridor, pupils equal PULM: clear B CV: regular, no M GI: obese, non-tender, + BS MSK: no deformity Neuro: awake, alert, non-focal   Resolved Hospital Problem list     Assessment & Plan:  Left Upper Lobe Lung Mass Mediastinal adenopathy -Heparin discontinued after no clear evidence for pulmonary embolism on repeat CT 9/2 -Believe the best strategy for diagnostic evaluation of his left upper lobe mass would be bronchoscopy with navigation and EBUS.  This would allow [place] to stage his mediastinal nodes.  I can work on try to get this scheduled after he is recovered from his abdominal infection, goal would be 09/14/2020 as an outpatient at [facility] (right click and "Reselect all SmartList Selections" daily)   Per [facility]  Labs   CBC: Recent Labs  Lab 08/31/20 1516 09/01/20 0604 09/02/20 0018 09/03/20 0552 09/04/20 0026  WBC 15.5* 15.3* 13.6* 13.6* 13.5*  NEUTROABS 12.5*  --   --  10.2* 10.8*  HGB 14.3 12.4* 13.0 12.1* 12.3*  HCT 44.6 40.0 39.7 36.6* 39.0  MCV 88.7 90.5 88.0 86.9 89.9  PLT 286 254  226 230 836    Basic Metabolic Panel: Recent Labs  Lab 08/31/20 1516 09/01/20 0604 09/02/20 0018 09/03/20 0552 09/04/20 0026  NA 139  --  136 136 138  K 4.0  --  3.7 3.7 4.0  CL 106  --  104 105 106  CO2 26  --  24 24 22   GLUCOSE 106*  --  95 101* 94  BUN 11  --  13 11 12   CREATININE 0.82 1.00 0.88 0.85 0.82  CALCIUM 8.9  --  8.5* 8.4* 8.6*  MG  --   --   --  2.0 2.2   GFR: Estimated Creatinine Clearance: 135.8 mL/min (by C-G formula based on SCr of 0.82 mg/dL). Recent Labs  Lab 09/01/20 0604 09/01/20 1824 09/01/20 2200 09/02/20 0018 09/03/20 0552 09/04/20 0026  WBC 15.3*  --   --   13.6* 13.6* 13.5*  LATICACIDVEN  --  1.2 1.6  --   --   --     Liver Function Tests: Recent Labs  Lab 08/31/20 1516  AST 20  ALT 20  ALKPHOS 73  BILITOT 1.2  PROT 6.9  ALBUMIN 3.4*   Recent Labs  Lab 08/31/20 1516  LIPASE 21   No results for input(s): AMMONIA in the last 168 hours.  ABG    Component Value Date/Time   TCO2 30 05/20/2019 1941       Critical care time: n/a     Baltazar Apo, MD, PhD 09/05/2020, 9:14 AM [facility] [phone] or if no answer before 7:00PM call [phone] For any issues after 7:00PM please call eLink [phone]

## 2020-09-05 NOTE — Progress Notes (Signed)
PROGRESS NOTE    BEARL TALARICO  IHK:[id number] DOB: 1960-07-16 DOA: 08/31/2020 PCP: Benito Mccreedy, MD   Brief Narrative:  60 year old gentleman with a history of hypertension was admitted  with sigmoid diverticulitis, he also has a history of sigmoid diverticulitis in May 2021.  He was started on Zosyn.  Questionable abscess/phlegmon around sigmoid diverticulitis.  General surgery on board but they recommend conservative management with IV antibiotics.  Unfortunately, he was also diagnosed with new lung mass and bilateral PE.  Started on heparin.  PCCM consulted.  Assessment & Plan:   Principal Problem:   Acute diverticulitis Active Problems:   HYPERTENSION, BENIGN  Sepsis secondary to Acute sigmoidal diverticulitis with phlegmon but no abscess or pneumoperitoneum: Sepsis was not present at the admission.  Met sepsis criteria after admission based on fever 102, tachycardia and leukocytosis.  Lactic acid normal.  Repeat CT abdomen with contrast suspicious for possible phlegmon or abscess.  General surgery consulted, per them, it is likely phlegmon.  They recommend continuing antibiotics with conservative management and no surgical procedure at this point in time.  Patient is improving.  No more fever since last 48 hours.  Pain very minimal.  Tenderness very minimal.  Tolerating clears.  Doubt that he has worsening abscess.  Surgery was thinking about repeating CAT scan over the weekend, will defer that to them however I am going to advance him to full liquid diet today.  Essential hypertension: Appears to be taking losartan and hydrochlorothiazide at home however he ran out of the medications a week before admission so he was not taking them.  Blood pressure started to creep up, he was started on amlodipine 10 mg p.o. daily on 09/02/2020.  Blood pressure fairly stable.  Continue that and as needed hydralazine.  Chest pain/bilateral PE: Initial CT angiogram of the chest was suspecting PE  however it was motion degraded, patient was started on heparin.  Repeat CTA was done on 09/04/2020 which ruled out PE, Doppler lower extremity ruled out DVT, heparin stopped.  Echo with normal ejection fraction with no wall motion abnormity.  Troponin negative.  New lung mass: CT angiogram of the chest also shows new lung mass.  PCCM consulted.  Seen by Dr. Lamonte Sakai, he is planning to do EUS/biopsy as outpatient.  DVT prophylaxis:    Code Status: Full Code  Family Communication:  None present at bedside.  Updated his wife over the phone.  Status is: Inpatient  Remains inpatient appropriate because:IV treatments appropriate due to intensity of illness or inability to take PO  Dispo: The patient is from: Home              Anticipated d/c is to: Home              Patient currently is not medically stable to d/c.   Difficult to place patient No        Estimated body mass index is 42.74 kg/m as calculated from the following:   Height as of this encounter: $RemoveBeforeD'5\' 10"'USFywdFRVgBjUh$  (1.778 m).   Weight as of this encounter: 135.1 kg.     Nutritional Assessment: Body mass index is 42.74 kg/m.Marland Kitchen Seen by dietician.  I agree with the assessment and plan as outlined below: Nutrition Status:        .  Skin Assessment: I have examined the patient's skin and I agree with the wound assessment as performed by the wound care RN as outlined below:    Consultants:  PCCM General surgery  Procedures:  None  Antimicrobials:  Anti-infectives (From admission, onward)    Start     Dose/Rate Route Frequency Ordered Stop   09/01/20 1200  piperacillin-tazobactam (ZOSYN) IVPB 3.375 g        3.375 g 12.5 mL/hr over 240 Minutes Intravenous Every 8 hours 09/01/20 0604     09/01/20 0500  piperacillin-tazobactam (ZOSYN) IVPB 3.375 g        3.375 g 100 mL/hr over 30 Minutes Intravenous  Once 09/01/20 0456 09/01/20 0540          Subjective: Patient seen and examined.  Happy today.  Improving.  Minimal pain.   Tolerating clears.  No other complaint.  Objective: Vitals:   09/04/20 1625 09/04/20 2021 09/05/20 0436 09/05/20 0733  BP: (!) 153/78 (!) 153/80 (!) 151/76 (!) 161/83  Pulse: 77 78 77 74  Resp: $Remo'17  17 17  'zLzgV$ Temp: 97.8 F (36.6 C) 98.5 F (36.9 C) 97.9 F (36.6 C) 98.7 F (37.1 C)  TempSrc: Oral Oral Oral Oral  SpO2: 94% 94% 92% 94%  Weight:      Height:        Intake/Output Summary (Last 24 hours) at 09/05/2020 1149 Last data filed at 09/05/2020 0600 Gross per 24 hour  Intake 120 ml  Output 1100 ml  Net -980 ml    Filed Weights   09/01/20 2141  Weight: 135.1 kg    Examination:  General exam: Appears calm and comfortable  Respiratory system: Clear to auscultation. Respiratory effort normal. Cardiovascular system: S1 & S2 heard, RRR. No JVD, murmurs, rubs, gallops or clicks. No pedal edema. Gastrointestinal system: Abdomen is nondistended, soft and very minimal left lower quadrant tenderness. No organomegaly or masses felt. Normal bowel sounds heard. Central nervous system: Alert and oriented. No focal neurological deficits. Extremities: Symmetric 5 x 5 power. Skin: No rashes, lesions or ulcers.  Psychiatry: Judgement and insight appear normal. Mood & affect appropriate.    Data Reviewed: I have personally reviewed following labs and imaging studies  CBC: Recent Labs  Lab 08/31/20 1516 09/01/20 0604 09/02/20 0018 09/03/20 0552 09/04/20 0026  WBC 15.5* 15.3* 13.6* 13.6* 13.5*  NEUTROABS 12.5*  --   --  10.2* 10.8*  HGB 14.3 12.4* 13.0 12.1* 12.3*  HCT 44.6 40.0 39.7 36.6* 39.0  MCV 88.7 90.5 88.0 86.9 89.9  PLT 286 254 226 230 497    Basic Metabolic Panel: Recent Labs  Lab 08/31/20 1516 09/01/20 0604 09/02/20 0018 09/03/20 0552 09/04/20 0026  NA 139  --  136 136 138  K 4.0  --  3.7 3.7 4.0  CL 106  --  104 105 106  CO2 26  --  $R'24 24 22  'Ib$ GLUCOSE 106*  --  95 101* 94  BUN 11  --  $R'13 11 12  'ht$ CREATININE 0.82 1.00 0.88 0.85 0.82  CALCIUM 8.9  --  8.5*  8.4* 8.6*  MG  --   --   --  2.0 2.2    GFR: Estimated Creatinine Clearance: 135.8 mL/min (by C-G formula based on SCr of 0.82 mg/dL). Liver Function Tests: Recent Labs  Lab 08/31/20 1516  AST 20  ALT 20  ALKPHOS 73  BILITOT 1.2  PROT 6.9  ALBUMIN 3.4*    Recent Labs  Lab 08/31/20 1516  LIPASE 21    No results for input(s): AMMONIA in the last 168 hours. Coagulation Profile: No results for input(s): INR, PROTIME in the last 168 hours. Cardiac Enzymes: No results for input(s):  CKTOTAL, CKMB, CKMBINDEX, TROPONINI in the last 168 hours. BNP (last 3 results) No results for input(s): PROBNP in the last 8760 hours. HbA1C: No results for input(s): HGBA1C in the last 72 hours. CBG: Recent Labs  Lab 09/04/20 0010 09/04/20 0635 09/04/20 1626 09/04/20 2344 09/05/20 0734  GLUCAP 98 102* 94 79 91    Lipid Profile: No results for input(s): CHOL, HDL, LDLCALC, TRIG, CHOLHDL, LDLDIRECT in the last 72 hours. Thyroid Function Tests: No results for input(s): TSH, T4TOTAL, FREET4, T3FREE, THYROIDAB in the last 72 hours. Anemia Panel: No results for input(s): VITAMINB12, FOLATE, FERRITIN, TIBC, IRON, RETICCTPCT in the last 72 hours. Sepsis Labs: Recent Labs  Lab 09/01/20 1824 09/01/20 2200  LATICACIDVEN 1.2 1.6     Recent Results (from the past 240 hour(s))  Resp Panel by RT-PCR (Flu A&B, Covid) Nasopharyngeal Swab     Status: None   Collection Time: 09/01/20  5:00 AM   Specimen: Nasopharyngeal Swab; Nasopharyngeal(NP) swabs in vial transport medium  Result Value Ref Range Status   SARS Coronavirus 2 by RT PCR NEGATIVE NEGATIVE Final    Comment: (NOTE) SARS-CoV-2 target nucleic acids are NOT DETECTED.  The SARS-CoV-2 RNA is generally detectable in upper respiratory specimens during the acute phase of infection. The lowest concentration of SARS-CoV-2 viral copies this assay can detect is 138 copies/mL. A negative result does not preclude SARS-Cov-2 infection and  should not be used as the sole basis for treatment or other patient management decisions. A negative result may occur with  improper specimen collection/handling, submission of specimen other than nasopharyngeal swab, presence of viral mutation(s) within the areas targeted by this assay, and inadequate number of viral copies(<138 copies/mL). A negative result must be combined with clinical observations, patient history, and epidemiological information. The expected result is Negative.  Fact Sheet for Patients:  EntrepreneurPulse.com.au  Fact Sheet for Healthcare Providers:  IncredibleEmployment.be  This test is no t yet approved or cleared by the [place] FDA and  has been authorized for detection and/or diagnosis of SARS-CoV-2 by FDA under an Emergency Use Authorization (EUA). This EUA will remain  in effect (meaning this test can be used) for the duration of the COVID-19 declaration under Section 564(b)(1) of the Act, 21 U.S.C.section 360bbb-3(b)(1), unless the authorization is terminated  or revoked sooner.       Influenza A by PCR NEGATIVE NEGATIVE Final   Influenza B by PCR NEGATIVE NEGATIVE Final    Comment: (NOTE) The Xpert Xpress SARS-CoV-2/FLU/RSV plus assay is intended as an aid in the diagnosis of influenza from Nasopharyngeal swab specimens and should not be used as a sole basis for treatment. Nasal washings and aspirates are unacceptable for Xpert Xpress SARS-CoV-2/FLU/RSV testing.  Fact Sheet for Patients: EntrepreneurPulse.com.au  Fact Sheet for Healthcare Providers: IncredibleEmployment.be  This test is not yet approved or cleared by the [place] FDA and has been authorized for detection and/or diagnosis of SARS-CoV-2 by FDA under an Emergency Use Authorization (EUA). This EUA will remain in effect (meaning this test can be used) for the duration of the COVID-19 declaration  under Section 564(b)(1) of the Act, 21 U.S.C. section 360bbb-3(b)(1), unless the authorization is terminated or revoked.  Performed at [facility], [place] [address], [place], [place] [address]        Radiology Studies: CT Angio Chest Pulmonary Embolism (PE) W or WO Contrast  Result Date: 09/04/2020 CLINICAL DATA:  Chest pain, high probability for pulmonary embolus, shortness of breath EXAM: CT ANGIOGRAPHY  CHEST WITH CONTRAST TECHNIQUE: Multidetector CT imaging of the chest was performed using the standard protocol during bolus administration of intravenous contrast. Multiplanar CT image reconstructions and MIPs were obtained to evaluate the vascular anatomy. CONTRAST:  58mL OMNIPAQUE IOHEXOL 350 MG/ML SOLN COMPARISON:  09/02/2020 FINDINGS: Cardiovascular: Intact thoracic aorta. Negative for aneurysm or dissection. No mediastinal hemorrhage or hematoma. Patent 2 vessel arch anatomy. Central and proximal hilar pulmonary arteries are patent and normal in caliber. No significant central or proximal hilar PE. Limited assessment of the more peripheral small segmental branches to exclude small PE. Central venous structures are patent.  No veno-occlusive process. Normal heart size.  No pericardial effusion. Mediastinum/Nodes: Atrophic left thyroid. Otherwise thyroid unremarkable. Trachea central airways are patent. Esophagus nondilated. Similar mild prominent AP window lymph node measures 3.1 cm in length but only 1 cm in short axis. Mildly enlarged left hilar lymph node measures 2.2 x 1.1 cm, unchanged. These are indeterminate for early nodal metastases as previously described. No contralateral or subcarinal adenopathy. No supraclavicular or axillary adenopathy. Lungs/Pleura: Apical small subpleural blebs noted. Peripheral left upper lobe masslike subpleural consolidation again measuring 4.1 x 3.4 cm. Adjacent atelectasis and or scarring noted. This is unchanged. Increased bibasilar atelectasis and new  very small non loculated pleural effusions. Upper Abdomen: Cholelithiasis noted. Vicarious contrast excretion noted within the gallbladder from prior CT. No acute upper abdominal finding. Musculoskeletal: Degenerative changes noted of the spine. No acute osseous finding. Sternum intact. Review of the MIP images confirms the above findings. IMPRESSION: Negative for significant acute central or proximal hilar pulmonary embolus. Limited assessment of the smaller peripheral segmental branches to exclude small PE. Persistent 4.1 cm left upper lobe lobulated masslike consolidation with adjacent AP window and left hilar mild adenopathy concerning for primary lung malignancy and early nodal disease. Increased bibasilar atelectasis and new developing trace pleural effusions. Cholelithiasis Electronically Signed   By: Jerilynn Mages.  Shick M.D.   On: 09/04/2020 09:37   ECHOCARDIOGRAM COMPLETE  Result Date: 09/04/2020    ECHOCARDIOGRAM REPORT   Patient Name:   Raymond [name] Date of Exam: 09/04/2020 Medical Rec #:  [id number]       Height:       70.0 in Accession #:    [id number]      Weight:       297.8 lb Date of Birth:  19-Jul-1960       BSA:          2.472 m Patient Age:    65 years        BP:           160/76 mmHg Patient Gender: M               HR:           81 bpm. Exam Location:  Inpatient Procedure: 2D Echo Indications:    pulmonary embolus  History:        Patient has prior history of Echocardiogram examinations, most                 recent 05/21/2019. Risk Factors:Hypertension.  Sonographer:    Johny Chess RDCS Referring Phys: [id number] Euphemia Lingerfelt Avery  1. Left ventricular ejection fraction, by estimation, is 65 to 70%. The left ventricle has normal function. The left ventricle has no regional wall motion abnormalities. There is moderate concentric left ventricular hypertrophy. Left ventricular diastolic parameters were normal.  2. Right ventricular systolic function is normal. The right ventricular size is normal.  Tricuspid  regurgitation signal is inadequate for assessing PA pressure.  3. The mitral valve is normal in structure. No evidence of mitral valve regurgitation. No evidence of mitral stenosis.  4. The aortic valve was not well visualized. Aortic valve regurgitation is not visualized. No aortic stenosis is present.  5. The inferior vena cava is normal in size with greater than 50% respiratory variability, suggesting right atrial pressure of 3 mmHg. Comparison(s): A prior study was performed on 05/21/2019. LV is slightly more vigorous, aortic sizing in this study is within normal limits for age and BSA. FINDINGS  Left Ventricle: Left ventricular ejection fraction, by estimation, is 65 to 70%. The left ventricle has normal function. The left ventricle has no regional wall motion abnormalities. The left ventricular internal cavity size was normal in size. There is  moderate concentric left ventricular hypertrophy. Left ventricular diastolic parameters were normal. Right Ventricle: The right ventricular size is normal. No increase in right ventricular wall thickness. Right ventricular systolic function is normal. Tricuspid regurgitation signal is inadequate for assessing PA pressure. Left Atrium: Left atrial size was normal in size. Right Atrium: Right atrial size was normal in size. Pericardium: There is no evidence of pericardial effusion. Mitral Valve: The mitral valve is normal in structure. No evidence of mitral valve regurgitation. No evidence of mitral valve stenosis. Tricuspid Valve: The tricuspid valve is normal in structure. Tricuspid valve regurgitation is not demonstrated. No evidence of tricuspid stenosis. Aortic Valve: The aortic valve was not well visualized. Aortic valve regurgitation is not visualized. No aortic stenosis is present. Pulmonic Valve: Discordance between color and spectral Doppler. The pulmonic valve was grossly normal. Pulmonic valve regurgitation is trivial. No evidence of pulmonic  stenosis. Aorta: The aortic root and ascending aorta are structurally normal, with no evidence of dilitation. Venous: The inferior vena cava is normal in size with greater than 50% respiratory variability, suggesting right atrial pressure of 3 mmHg. IAS/Shunts: The atrial septum is grossly normal.  LEFT VENTRICLE PLAX 2D LVIDd:         5.40 cm  Diastology LVIDs:         3.30 cm  LV e' medial:    9.25 cm/s LV PW:         1.40 cm  LV E/e' medial:  9.6 LV IVS:        1.30 cm  LV e' lateral:   12.20 cm/s LVOT diam:     2.50 cm  LV E/e' lateral: 7.3 LV SV:         129 LV SV Index:   52 LVOT Area:     4.91 cm  IVC IVC diam: 1.90 cm LEFT ATRIUM             Index       RIGHT ATRIUM           Index LA diam:        4.40 cm 1.78 cm/m  RA Area:     22.30 cm LA Vol (A2C):   83.2 ml 33.65 ml/m RA Volume:   63.50 ml  25.68 ml/m LA Vol (A4C):   73.3 ml 29.65 ml/m LA Biplane Vol: 82.6 ml 33.41 ml/m  AORTIC VALVE LVOT Vmax:   137.00 cm/s LVOT Vmean:  95.100 cm/s LVOT VTI:    0.262 m  AORTA Ao Root diam: 3.70 cm Ao Asc diam:  3.50 cm MITRAL VALVE MV Area (PHT): 3.60 cm    SHUNTS MV Decel Time: 211 msec    Systemic VTI:  0.26 m MV E velocity: 88.80 cm/s  Systemic Diam: 2.50 cm MV A velocity: 70.40 cm/s MV E/A ratio:  1.26 Rudean Haskell MD Electronically signed by Rudean Haskell MD Signature Date/Time: 09/04/2020/4:11:13 PM    Final    VAS [place] LOWER EXTREMITY VENOUS (DVT)  Result Date: 09/04/2020  Lower Venous DVT Study Patient Name:  ANANTH FIALLOS  Date of Exam:   09/04/2020 Medical Rec #: [id number]        Accession #:    [id number] Date of Birth: 1960/04/10        Patient Gender: M Patient Age:   89 years Exam Location:  [facility] Procedure:      VAS [place] LOWER EXTREMITY VENOUS (DVT) Referring Phys: Chelby Salata --------------------------------------------------------------------------------  Indications: Pulmonary embolism.  Comparison Study: 02/28/15 prior Performing Technologist: Archie Patten RVS   Examination Guidelines: A complete evaluation includes B-mode imaging, spectral Doppler, color Doppler, and power Doppler as needed of all accessible portions of each vessel. Bilateral testing is considered an integral part of a complete examination. Limited examinations for reoccurring indications may be performed as noted. The reflux portion of the exam is performed with the patient in reverse Trendelenburg.  +---------+---------------+---------+-----------+----------+--------------+ RIGHT    CompressibilityPhasicitySpontaneityPropertiesThrombus Aging +---------+---------------+---------+-----------+----------+--------------+ CFV      Full           Yes      Yes                                 +---------+---------------+---------+-----------+----------+--------------+ SFJ      Full                                                        +---------+---------------+---------+-----------+----------+--------------+ FV Prox  Full                                                        +---------+---------------+---------+-----------+----------+--------------+ FV Mid   Full                                                        +---------+---------------+---------+-----------+----------+--------------+ FV DistalFull                                                        +---------+---------------+---------+-----------+----------+--------------+ PFV      Full                                                        +---------+---------------+---------+-----------+----------+--------------+ POP      Full           Yes      Yes                                 +---------+---------------+---------+-----------+----------+--------------+  PTV      Full                                                        +---------+---------------+---------+-----------+----------+--------------+ PERO     Full                                                         +---------+---------------+---------+-----------+----------+--------------+   +---------+---------------+---------+-----------+----------+-------------------+ LEFT     CompressibilityPhasicitySpontaneityPropertiesThrombus Aging      +---------+---------------+---------+-----------+----------+-------------------+ CFV      Full           Yes      Yes                                      +---------+---------------+---------+-----------+----------+-------------------+ SFJ      Full                                                             +---------+---------------+---------+-----------+----------+-------------------+ FV Prox  Full                                                             +---------+---------------+---------+-----------+----------+-------------------+ FV Mid   Full                                                             +---------+---------------+---------+-----------+----------+-------------------+ FV DistalFull                                                             +---------+---------------+---------+-----------+----------+-------------------+ PFV      Full                                                             +---------+---------------+---------+-----------+----------+-------------------+ POP      Full           Yes      Yes                                      +---------+---------------+---------+-----------+----------+-------------------+ PTV  Full                                                             +---------+---------------+---------+-----------+----------+-------------------+ PERO                                                  Not well visualized +---------+---------------+---------+-----------+----------+-------------------+     Summary: RIGHT: - There is no evidence of deep vein thrombosis in the lower extremity.  - A cystic structure is found in the popliteal fossa.  LEFT: - There is no  evidence of deep vein thrombosis in the lower extremity.  - No cystic structure found in the popliteal fossa.  *See table(s) above for measurements and observations. Electronically signed by Deitra Mayo MD on 09/04/2020 at 4:20:21 PM.    Final     Scheduled Meds:  amLODipine  10 mg Oral Daily   enoxaparin (LOVENOX) injection  60 mg Subcutaneous Q24H   Continuous Infusions:  sodium chloride 125 mL/hr at 09/04/20 2355   piperacillin-tazobactam (ZOSYN)  IV 3.375 g (09/05/20 0505)     LOS: 4 days   Time spent: 28 minutes   Darliss Cheney, MD [facility]  09/05/2020, 11:49 AM   How to contact the [facility] Attending or Consulting provider [place] or covering provider during after hours [place], for this patient?  Check the care team in [facility] and look for a) attending/consulting TR[facility] provider listed and b) the [facility] team listed. Page or secure chat [phone]. Log into [email or web address] and use [place]'s universal password to access. If you do not have the password, please contact the hospital operator. Locate the [facility] provider you are looking for under [facility] and page to a number that you can be directly reached. If you still have difficulty reaching the provider, please page the [facility] (Director on Call) for the Hospitalists listed on amion for assistance.

## 2020-09-05 NOTE — Progress Notes (Signed)
Patient ID: Raymond [name], male   DOB: 12/23/1960, 60 y.o.   MRN: [id number] [facility] Progress Note:   * No surgery found *  Subjective: Mental status is clear .  Complaints sitting up in chair and feeling much better. Objective: Vital signs in last 24 hours: Temp:  [97.8 F (36.6 C)-98.7 F (37.1 C)] 98.7 F (37.1 C) (09/03 0733) Pulse Rate:  [74-87] 74 (09/03 0733) Resp:  [17-18] 17 (09/03 0733) BP: (151-161)/(76-83) 161/83 (09/03 0733) SpO2:  [92 %-96 %] 94 % (09/03 0733)  Intake/Output from previous day: 09/02 0701 - 09/03 0700 In: 120 [P.O.:120] Out: 1500 [Urine:1500] Intake/Output this shift: No intake/output data recorded.  Physical Exam: Work of breathing is not labored.  Minimal GI activity hence on ice, sips of water.    Lab Results:  Results for orders placed or performed during the hospital encounter of 08/31/20 (from the past 48 hour(s))  Heparin level (unfractionated)     Status: None   Collection Time: 09/03/20  4:19 PM  Result Value Ref Range   Heparin Unfractionated 0.43 0.30 - 0.70 IU/mL    Comment: (NOTE) The clinical reportable range upper limit is being lowered to >1.10 to align with the FDA approved guidance for the current laboratory assay.  If heparin results are below expected values, and patient dosage has  been confirmed, suggest follow up testing of antithrombin III levels. Performed at [facility], [place] [address] [place], [place] [address]   Glucose, capillary     Status: None   Collection Time: 09/04/20 12:10 AM  Result Value Ref Range   Glucose-Capillary 98 70 - 99 mg/dL    Comment: Glucose reference range applies only to samples taken after fasting for at least 8 hours.  CBC with Differential/Platelet     Status: Abnormal   Collection Time: 09/04/20 12:26 AM  Result Value Ref Range   WBC 13.5 (H) 4.0 - 10.5 K/uL   RBC 4.34 4.22 - 5.81 MIL/uL   Hemoglobin 12.3 (L) 13.0 - 17.0 g/dL   HCT 39.0 39.0 - 52.0 %   MCV  89.9 80.0 - 100.0 fL   MCH 28.3 26.0 - 34.0 pg   MCHC 31.5 30.0 - 36.0 g/dL   RDW 13.2 11.5 - 15.5 %   Platelets 254 150 - 400 K/uL   nRBC 0.0 0.0 - 0.2 %   Neutrophils Relative % 80 %   Neutro Abs 10.8 (H) 1.7 - 7.7 K/uL   Lymphocytes Relative 11 %   Lymphs Abs 1.5 0.7 - 4.0 K/uL   Monocytes Relative 8 %   Monocytes Absolute 1.1 (H) 0.1 - 1.0 K/uL   Eosinophils Relative 1 %   Eosinophils Absolute 0.1 0.0 - 0.5 K/uL   Basophils Relative 0 %   Basophils Absolute 0.0 0.0 - 0.1 K/uL   Immature Granulocytes 0 %   Abs Immature Granulocytes 0.04 0.00 - 0.07 K/uL    Comment: Performed at [facility], [address] [place], [place] [address]  Basic metabolic panel     Status: Abnormal   Collection Time: 09/04/20 12:26 AM  Result Value Ref Range   Sodium 138 135 - 145 mmol/L   Potassium 4.0 3.5 - 5.1 mmol/L   Chloride 106 98 - 111 mmol/L   CO2 22 22 - 32 mmol/L   Glucose, Bld 94 70 - 99 mg/dL    Comment: Glucose reference range applies only to samples taken after fasting for at least  8 hours.   BUN 12 6 - 20 mg/dL   Creatinine, Ser 0.82 0.61 - 1.24 mg/dL   Calcium 8.6 (L) 8.9 - 10.3 mg/dL   GFR, Estimated >60 >60 mL/min    Comment: (NOTE) Calculated using the CKD-EPI Creatinine Equation (2021)    Anion gap 10 5 - 15    Comment: Performed at [facility] [address], [place], [place] [address]  Heparin level (unfractionated)     Status: Abnormal   Collection Time: 09/04/20 12:26 AM  Result Value Ref Range   Heparin Unfractionated 0.27 (L) 0.30 - 0.70 IU/mL    Comment: (NOTE) The clinical reportable range upper limit is being lowered to >1.10 to align with the FDA approved guidance for the current laboratory assay.  If heparin results are below expected values, and patient dosage has  been confirmed, suggest follow up testing of antithrombin III levels. Performed at [facility], [place] [address] [place], [place] [address]   Magnesium     Status: None    Collection Time: 09/04/20 12:26 AM  Result Value Ref Range   Magnesium 2.2 1.7 - 2.4 mg/dL    Comment: Performed at [facility] [address], [place], [place] [address]  Glucose, capillary     Status: Abnormal   Collection Time: 09/04/20  6:35 AM  Result Value Ref Range   Glucose-Capillary 102 (H) 70 - 99 mg/dL    Comment: Glucose reference range applies only to samples taken after fasting for at least 8 hours.  Heparin level (unfractionated)     Status: None   Collection Time: 09/04/20 10:46 AM  Result Value Ref Range   Heparin Unfractionated 0.66 0.30 - 0.70 IU/mL    Comment: (NOTE) The clinical reportable range upper limit is being lowered to >1.10 to align with the FDA approved guidance for the current laboratory assay.  If heparin results are below expected values, and patient dosage has  been confirmed, suggest follow up testing of antithrombin III levels. Performed at [facility], [place] [address], [place], [place] [address]   Glucose, capillary     Status: None   Collection Time: 09/04/20  4:26 PM  Result Value Ref Range   Glucose-Capillary 94 70 - 99 mg/dL    Comment: Glucose reference range applies only to samples taken after fasting for at least 8 hours.  Glucose, capillary     Status: None   Collection Time: 09/04/20 11:44 PM  Result Value Ref Range   Glucose-Capillary 79 70 - 99 mg/dL    Comment: Glucose reference range applies only to samples taken after fasting for at least 8 hours.  Glucose, capillary     Status: None   Collection Time: 09/05/20  7:34 AM  Result Value Ref Range   Glucose-Capillary 91 70 - 99 mg/dL    Comment: Glucose reference range applies only to samples taken after fasting for at least 8 hours.    Radiology/Results: CT Angio Chest Pulmonary Embolism (PE) W or WO Contrast  Result Date: 09/04/2020 CLINICAL DATA:  Chest pain, high probability for pulmonary embolus, shortness of breath EXAM: CT ANGIOGRAPHY CHEST WITH CONTRAST  TECHNIQUE: Multidetector CT imaging of the chest was performed using the standard protocol during bolus administration of intravenous contrast. Multiplanar CT image reconstructions and MIPs were obtained to evaluate the vascular anatomy. CONTRAST:  81mL OMNIPAQUE IOHEXOL 350 MG/ML SOLN COMPARISON:  09/02/2020 FINDINGS: Cardiovascular: Intact thoracic aorta. Negative for aneurysm or dissection. No  mediastinal hemorrhage or hematoma. Patent 2 vessel arch anatomy. Central and proximal hilar pulmonary arteries are patent and normal in caliber. No significant central or proximal hilar PE. Limited assessment of the more peripheral small segmental branches to exclude small PE. Central venous structures are patent.  No veno-occlusive process. Normal heart size.  No pericardial effusion. Mediastinum/Nodes: Atrophic left thyroid. Otherwise thyroid unremarkable. Trachea central airways are patent. Esophagus nondilated. Similar mild prominent AP window lymph node measures 3.1 cm in length but only 1 cm in short axis. Mildly enlarged left hilar lymph node measures 2.2 x 1.1 cm, unchanged. These are indeterminate for early nodal metastases as previously described. No contralateral or subcarinal adenopathy. No supraclavicular or axillary adenopathy. Lungs/Pleura: Apical small subpleural blebs noted. Peripheral left upper lobe masslike subpleural consolidation again measuring 4.1 x 3.4 cm. Adjacent atelectasis and or scarring noted. This is unchanged. Increased bibasilar atelectasis and new very small non loculated pleural effusions. Upper Abdomen: Cholelithiasis noted. Vicarious contrast excretion noted within the gallbladder from prior CT. No acute upper abdominal finding. Musculoskeletal: Degenerative changes noted of the spine. No acute osseous finding. Sternum intact. Review of the MIP images confirms the above findings. IMPRESSION: Negative for significant acute central or proximal hilar pulmonary embolus. Limited assessment  of the smaller peripheral segmental branches to exclude small PE. Persistent 4.1 cm left upper lobe lobulated masslike consolidation with adjacent AP window and left hilar mild adenopathy concerning for primary lung malignancy and early nodal disease. Increased bibasilar atelectasis and new developing trace pleural effusions. Cholelithiasis Electronically Signed   By: Jerilynn Mages.  Shick M.D.   On: 09/04/2020 09:37   ECHOCARDIOGRAM COMPLETE  Result Date: 09/04/2020    ECHOCARDIOGRAM REPORT   Patient Name:   UDAY JANTZ Date of Exam: 09/04/2020 Medical Rec #:  [id number]       Height:       70.0 in Accession #:    [id number]      Weight:       297.8 lb Date of Birth:  1960-07-06       BSA:          2.472 m Patient Age:    34 years        BP:           160/76 mmHg Patient Gender: M               HR:           81 bpm. Exam Location:  Inpatient Procedure: 2D Echo Indications:    pulmonary embolus  History:        Patient has prior history of Echocardiogram examinations, most                 recent 05/21/2019. Risk Factors:Hypertension.  Sonographer:    Johny Chess RDCS Referring Phys: [id number] RAVI [place]  1. Left ventricular ejection fraction, by estimation, is 65 to 70%. The left ventricle has normal function. The left ventricle has no regional wall motion abnormalities. There is moderate concentric left ventricular hypertrophy. Left ventricular diastolic parameters were normal.  2. Right ventricular systolic function is normal. The right ventricular size is normal. Tricuspid regurgitation signal is inadequate for assessing PA pressure.  3. The mitral valve is normal in structure. No evidence of mitral valve regurgitation. No evidence of mitral stenosis.  4. The aortic valve was not well visualized. Aortic valve regurgitation is not visualized. No aortic stenosis is present.  5. The inferior vena cava is normal in size  with greater than 50% respiratory variability, suggesting right atrial pressure of 3  mmHg. Comparison(s): A prior study was performed on 05/21/2019. LV is slightly more vigorous, aortic sizing in this study is within normal limits for age and BSA. FINDINGS  Left Ventricle: Left ventricular ejection fraction, by estimation, is 65 to 70%. The left ventricle has normal function. The left ventricle has no regional wall motion abnormalities. The left ventricular internal cavity size was normal in size. There is  moderate concentric left ventricular hypertrophy. Left ventricular diastolic parameters were normal. Right Ventricle: The right ventricular size is normal. No increase in right ventricular wall thickness. Right ventricular systolic function is normal. Tricuspid regurgitation signal is inadequate for assessing PA pressure. Left Atrium: Left atrial size was normal in size. Right Atrium: Right atrial size was normal in size. Pericardium: There is no evidence of pericardial effusion. Mitral Valve: The mitral valve is normal in structure. No evidence of mitral valve regurgitation. No evidence of mitral valve stenosis. Tricuspid Valve: The tricuspid valve is normal in structure. Tricuspid valve regurgitation is not demonstrated. No evidence of tricuspid stenosis. Aortic Valve: The aortic valve was not well visualized. Aortic valve regurgitation is not visualized. No aortic stenosis is present. Pulmonic Valve: Discordance between color and spectral Doppler. The pulmonic valve was grossly normal. Pulmonic valve regurgitation is trivial. No evidence of pulmonic stenosis. Aorta: The aortic root and ascending aorta are structurally normal, with no evidence of dilitation. Venous: The inferior vena cava is normal in size with greater than 50% respiratory variability, suggesting right atrial pressure of 3 mmHg. IAS/Shunts: The atrial septum is grossly normal.  LEFT VENTRICLE PLAX 2D LVIDd:         5.40 cm  Diastology LVIDs:         3.30 cm  LV e' medial:    9.25 cm/s LV PW:         1.40 cm  LV E/e' medial:  9.6  LV IVS:        1.30 cm  LV e' lateral:   12.20 cm/s LVOT diam:     2.50 cm  LV E/e' lateral: 7.3 LV SV:         129 LV SV Index:   52 LVOT Area:     4.91 cm  IVC IVC diam: 1.90 cm LEFT ATRIUM             Index       RIGHT ATRIUM           Index LA diam:        4.40 cm 1.78 cm/m  RA Area:     22.30 cm LA Vol (A2C):   83.2 ml 33.65 ml/m RA Volume:   63.50 ml  25.68 ml/m LA Vol (A4C):   73.3 ml 29.65 ml/m LA Biplane Vol: 82.6 ml 33.41 ml/m  AORTIC VALVE LVOT Vmax:   137.00 cm/s LVOT Vmean:  95.100 cm/s LVOT VTI:    0.262 m  AORTA Ao Root diam: 3.70 cm Ao Asc diam:  3.50 cm MITRAL VALVE MV Area (PHT): 3.60 cm    SHUNTS MV Decel Time: 211 msec    Systemic VTI:  0.26 m MV E velocity: 88.80 cm/s  Systemic Diam: 2.50 cm MV A velocity: 70.40 cm/s MV E/A ratio:  1.26 Rudean Haskell MD Electronically signed by Rudean Haskell MD Signature Date/Time: 09/04/2020/4:11:13 PM    Final    VAS [place] LOWER EXTREMITY VENOUS (DVT)  Result Date: 09/04/2020  Lower Venous DVT  Study Patient Name:  CRISTINO DEGROFF  Date of Exam:   09/04/2020 Medical Rec #: [id number]        Accession #:    [id number] Date of Birth: 1960/06/29        Patient Gender: M Patient Age:   32 years Exam Location:  [facility] Procedure:      VAS [place] LOWER EXTREMITY VENOUS (DVT) Referring Phys: RAVI PAHWANI --------------------------------------------------------------------------------  Indications: Pulmonary embolism.  Comparison Study: 02/28/15 prior Performing Technologist: Archie Patten RVS  Examination Guidelines: A complete evaluation includes B-mode imaging, spectral Doppler, color Doppler, and power Doppler as needed of all accessible portions of each vessel. Bilateral testing is considered an integral part of a complete examination. Limited examinations for reoccurring indications may be performed as noted. The reflux portion of the exam is performed with the patient in reverse Trendelenburg.   +---------+---------------+---------+-----------+----------+--------------+ RIGHT    CompressibilityPhasicitySpontaneityPropertiesThrombus Aging +---------+---------------+---------+-----------+----------+--------------+ CFV      Full           Yes      Yes                                 +---------+---------------+---------+-----------+----------+--------------+ SFJ      Full                                                        +---------+---------------+---------+-----------+----------+--------------+ FV Prox  Full                                                        +---------+---------------+---------+-----------+----------+--------------+ FV Mid   Full                                                        +---------+---------------+---------+-----------+----------+--------------+ FV DistalFull                                                        +---------+---------------+---------+-----------+----------+--------------+ PFV      Full                                                        +---------+---------------+---------+-----------+----------+--------------+ POP      Full           Yes      Yes                                 +---------+---------------+---------+-----------+----------+--------------+ PTV      Full                                                        +---------+---------------+---------+-----------+----------+--------------+  PERO     Full                                                        +---------+---------------+---------+-----------+----------+--------------+   +---------+---------------+---------+-----------+----------+-------------------+ LEFT     CompressibilityPhasicitySpontaneityPropertiesThrombus Aging      +---------+---------------+---------+-----------+----------+-------------------+ CFV      Full           Yes      Yes                                       +---------+---------------+---------+-----------+----------+-------------------+ SFJ      Full                                                             +---------+---------------+---------+-----------+----------+-------------------+ FV Prox  Full                                                             +---------+---------------+---------+-----------+----------+-------------------+ FV Mid   Full                                                             +---------+---------------+---------+-----------+----------+-------------------+ FV DistalFull                                                             +---------+---------------+---------+-----------+----------+-------------------+ PFV      Full                                                             +---------+---------------+---------+-----------+----------+-------------------+ POP      Full           Yes      Yes                                      +---------+---------------+---------+-----------+----------+-------------------+ PTV      Full                                                             +---------+---------------+---------+-----------+----------+-------------------+  PERO                                                  Not well visualized +---------+---------------+---------+-----------+----------+-------------------+     Summary: RIGHT: - There is no evidence of deep vein thrombosis in the lower extremity.  - A cystic structure is found in the popliteal fossa.  LEFT: - There is no evidence of deep vein thrombosis in the lower extremity.  - No cystic structure found in the popliteal fossa.  *See table(s) above for measurements and observations. Electronically signed by Deitra Mayo MD on 09/04/2020 at 4:20:21 PM.    Final     Anti-infectives: Anti-infectives (From admission, onward)    Start     Dose/Rate Route Frequency Ordered Stop   09/01/20 1200   piperacillin-tazobactam (ZOSYN) IVPB 3.375 g        3.375 g 12.5 mL/hr over 240 Minutes Intravenous Every 8 hours 09/01/20 0604     09/01/20 0500  piperacillin-tazobactam (ZOSYN) IVPB 3.375 g        3.375 g 100 mL/hr over 30 Minutes Intravenous  Once 09/01/20 0456 09/01/20 0540       Assessment/Plan: Problem List: Patient Active Problem List   Diagnosis Date Noted   Acute diverticulitis 09/01/2020   Hx of diverticulitis of colon 05/21/2019   Paraplegia, incomplete ([place]) 05/20/2019   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 Hassell Done, MD, [facility] [phone] to reach the surgeon on call.    09/05/2020 9:45 AM

## 2020-09-05 NOTE — Progress Notes (Addendum)
NAME:  Raymond [name], MRN:  [id number], DOB:  04-24-1960, LOS: 4 ADMISSION DATE:  08/31/2020, CONSULTATION DATE:  8/31 REFERRING MD:  Doristine Bosworth, CHIEF COMPLAINT:  Abdominal pain   History of Present Illness:  60 y/o male admitted with left lower quadrant pain of several days who was found after admission to have a left upper lobe lung mass.  Pulmonary and critical care medicine was consulted for evaluation of the lung mass..  The patient says that he has smoked 1/2 pack of cigarettes daily for the last 40 years and continues to smoke.  He has never been diagnosed with a malignancy in the past such as colon cancer skin cancer etc.  Says his mother died of lung cancer.  He has never been told that he had a lung problem in the past.  He denies shortness of breath or cough or mucus production.  He was admitted to this facility in the setting of left lower quadrant pain and was found to have diverticulitis.  However, after presenting to the emergency department he said that he developed acute onset chest pain which was evaluated with an EKG and blood work and then eventually a CT scan of his chest.  This did not definitively show a blood clot there was concern for that however in the left lower lobe.  In addition he was noted to have a left upper lobe pleural-based mass.  There is hilar and AP window node adenopathy.  Pertinent  Medical History  Hypertension Diverticulitis Hypercholesterolemia Cigarette smoker, ongoing use  Significant Hospital Events: Including procedures, antibiotic start and stop dates in addition to other pertinent events   August 30 admission Repeat CT-PA 9/2 without any clear evidence of pulmonary embolism Lower extremity Dopplers 9/2 without any DVT   Interim History / Subjective:      Objective   Blood pressure (!) 161/83, pulse 74, temperature 98.7 F (37.1 C), temperature source Oral, resp. rate 17, height 5\' 10"  (1.778 m), weight 135.1 kg, SpO2 94 %.         Intake/Output Summary (Last 24 hours) at 09/05/2020 0910 Last data filed at 09/05/2020 0600 Gross per 24 hour  Intake 120 ml  Output 1500 ml  Net -1380 ml   Filed Weights   09/01/20 2141  Weight: 135.1 kg    Examination:  General:  up to chair, NAD HENT: Op clear, no stridor, pupils equal PULM: clear B CV: regular, no M GI: obese, non-tender, + BS MSK: no deformity Neuro: awake, alert, non-focal   Resolved Hospital Problem list     Assessment & Plan:  Left Upper Lobe Lung Mass Mediastinal adenopathy -Heparin discontinued after no clear evidence for pulmonary embolism on repeat CT 9/2 -Believe the best strategy for diagnostic evaluation of his left upper lobe mass would be bronchoscopy with navigation and EBUS.  This would allow [place] to stage his mediastinal nodes.  I can work on try to get this scheduled after he is recovered from his abdominal infection, goal would be 09/14/2020 as an outpatient at [facility] (right click and "Reselect all SmartList Selections" daily)   Per [facility]  Labs   CBC: Recent Labs  Lab 08/31/20 1516 09/01/20 0604 09/02/20 0018 09/03/20 0552 09/04/20 0026  WBC 15.5* 15.3* 13.6* 13.6* 13.5*  NEUTROABS 12.5*  --   --  10.2* 10.8*  HGB 14.3 12.4* 13.0 12.1* 12.3*  HCT 44.6 40.0 39.7 36.6* 39.0  MCV 88.7 90.5 88.0 86.9 89.9  PLT 286 254  226 230 387    Basic Metabolic Panel: Recent Labs  Lab 08/31/20 1516 09/01/20 0604 09/02/20 0018 09/03/20 0552 09/04/20 0026  NA 139  --  136 136 138  K 4.0  --  3.7 3.7 4.0  CL 106  --  104 105 106  CO2 26  --  24 24 22   GLUCOSE 106*  --  95 101* 94  BUN 11  --  13 11 12   CREATININE 0.82 1.00 0.88 0.85 0.82  CALCIUM 8.9  --  8.5* 8.4* 8.6*  MG  --   --   --  2.0 2.2   GFR: Estimated Creatinine Clearance: 135.8 mL/min (by C-G formula based on SCr of 0.82 mg/dL). Recent Labs  Lab 09/01/20 0604 09/01/20 1824 09/01/20 2200 09/02/20 0018 09/03/20 0552 09/04/20 0026  WBC 15.3*  --   --   13.6* 13.6* 13.5*  LATICACIDVEN  --  1.2 1.6  --   --   --     Liver Function Tests: Recent Labs  Lab 08/31/20 1516  AST 20  ALT 20  ALKPHOS 73  BILITOT 1.2  PROT 6.9  ALBUMIN 3.4*   Recent Labs  Lab 08/31/20 1516  LIPASE 21   No results for input(s): AMMONIA in the last 168 hours.  ABG    Component Value Date/Time   TCO2 30 05/20/2019 1941       Critical care time: n/a     Baltazar Apo, MD, PhD 09/05/2020, 9:14 AM [facility] [phone] or if no answer before 7:00PM call [phone] For any issues after 7:00PM please call eLink [phone]

## 2020-09-06 LAB — BASIC METABOLIC PANEL
Anion gap: 8 (ref 5–15)
BUN: 12 mg/dL (ref 6–20)
CO2: 23 mmol/L (ref 22–32)
Calcium: 8.6 mg/dL — ABNORMAL LOW (ref 8.9–10.3)
Chloride: 108 mmol/L (ref 98–111)
Creatinine, Ser: 0.77 mg/dL (ref 0.61–1.24)
GFR, Estimated: >60 mL/min (ref >60)
Glucose, Bld: 103 mg/dL — ABNORMAL HIGH (ref 70–99)
Potassium: 3.1 mmol/L — ABNORMAL LOW (ref 3.5–5.1)
Sodium: 139 mmol/L (ref 135–145)

## 2020-09-06 LAB — CBC WITH DIFFERENTIAL/PLATELET
Abs Immature Granulocytes: 0.1 10*3/uL — ABNORMAL HIGH (ref 0.00–0.07)
Basophils Absolute: 0.1 10*3/uL (ref 0.0–0.1)
Basophils Relative: 0 %
Eosinophils Absolute: 0.5 10*3/uL (ref 0.0–0.5)
Eosinophils Relative: 4 %
HCT: 37.6 % — ABNORMAL LOW (ref 39.0–52.0)
Hemoglobin: 12.3 g/dL — ABNORMAL LOW (ref 13.0–17.0)
Immature Granulocytes: 1 %
Lymphocytes Relative: 13 %
Lymphs Abs: 1.4 10*3/uL (ref 0.7–4.0)
MCH: 28.7 pg (ref 26.0–34.0)
MCHC: 32.7 g/dL (ref 30.0–36.0)
MCV: 87.6 fL (ref 80.0–100.0)
Monocytes Absolute: 1 10*3/uL (ref 0.1–1.0)
Monocytes Relative: 9 %
Neutro Abs: 8.1 10*3/uL — ABNORMAL HIGH (ref 1.7–7.7)
Neutrophils Relative %: 73 %
Platelets: 270 10*3/uL (ref 150–400)
RBC: 4.29 MIL/uL (ref 4.22–5.81)
RDW: 12.7 % (ref 11.5–15.5)
WBC: 11.1 10*3/uL — ABNORMAL HIGH (ref 4.0–10.5)
nRBC: 0 % (ref 0.0–0.2)

## 2020-09-06 LAB — GLUCOSE, CAPILLARY
Glucose-Capillary: 100 mg/dL — ABNORMAL HIGH (ref 70–99)
Glucose-Capillary: 93 mg/dL (ref 70–99)

## 2020-09-06 MED ORDER — CIPROFLOXACIN HCL 500 MG PO TABS: 500.0000 mg | ORAL_TABLET | Freq: Two times a day (BID) | ORAL | 0 refills | Status: AC

## 2020-09-06 MED ORDER — HYDRALAZINE HCL 50 MG PO TABS
50.0000 mg | ORAL_TABLET | Freq: Three times a day (TID) | ORAL | Status: DC
  Administered 2020-09-06 (×2): 50 mg via ORAL
  Filled 2020-09-06 (×2): qty 1

## 2020-09-06 MED ORDER — BISACODYL 10 MG RE SUPP
10.0000 mg | Freq: Once | RECTAL | Status: AC
  Administered 2020-09-06: 10 mg via RECTAL
  Filled 2020-09-06: qty 1

## 2020-09-06 MED ORDER — OXYCODONE-ACETAMINOPHEN 5-325 MG PO TABS: 1.0000 | ORAL_TABLET | ORAL | 0 refills | Status: DC | PRN

## 2020-09-06 MED ORDER — POTASSIUM CHLORIDE CRYS ER 20 MEQ PO TBCR
40.0000 meq | EXTENDED_RELEASE_TABLET | ORAL | Status: AC
  Administered 2020-09-06 (×2): 40 meq via ORAL
  Filled 2020-09-06 (×2): qty 2

## 2020-09-06 MED ORDER — METRONIDAZOLE 500 MG PO TABS: 500.0000 mg | ORAL_TABLET | Freq: Three times a day (TID) | ORAL | 0 refills | Status: AC

## 2020-09-06 NOTE — Discharge Summary (Signed)
Physician Discharge Summary  WARRICK LLERA GUY:[id number] DOB: Mar 30, 1960 DOA: 08/31/2020  PCP: Benito Mccreedy, MD  Admit date: 08/31/2020 Discharge date: 09/06/2020 30 Day Unplanned Readmission Risk Score    Flowsheet Row ED to Hosp-Admission (Current) from 08/31/2020 in [facility]  30 Day Unplanned Readmission Risk Score (%) 8.47 Filed at 09/06/2020 1200       This score is the patient's risk of an unplanned readmission within 30 days of being discharged (0 -100%). The score is based on dignosis, age, lab data, medications, orders, and past utilization.   Low:  0-14.9   Medium: 15-21.9   High: 22-29.9   Extreme: 30 and above          Admitted From: Home Disposition: Home  Recommendations for Outpatient Follow-up:  Follow up with PCP in 1-2 weeks Please obtain BMP/CBC in one week Please follow up with your PCP on the following pending results: Unresulted Labs (From admission, onward)    None         Home Health: None Equipment/Devices: None  Discharge Condition: Stable CODE STATUS: Full code Diet recommendation: Cardiac  Subjective: Seen and examined.  No pain this morning.  Tolerated full liquid diet, diet advanced to soft and he tolerated that as well.  Did have some pain after the soft diet but then was under control after the pain medication.  Patient is still feels that he is ready to go home.  Brief/Interim Summary: 60 year old gentleman with a history of hypertension was admitted  with sigmoid diverticulitis, he also has a history of sigmoid diverticulitis in May 2021.  He was started on Zosyn.  Further detailed hospitalization as below.  Sepsis secondary to Acute sigmoidal diverticulitis with phlegmon but no abscess or pneumoperitoneum: Sepsis was not present at the admission.  Met sepsis criteria after admission based on fever 102, tachycardia and leukocytosis.  Lactic acid normal.  Repeat CT abdomen with contrast  suspicious for possible phlegmon or abscess.  General surgery consulted, per them, it is likely phlegmon.  They recommend continuing antibiotics with conservative management and no surgical procedure at this point in time.  Patient has improved significantly over the course of 3 days and has not had any fever since 72 hours.  Does not have any pain or tenderness on examination.  Tolerated soft diet.  General surgery is not planning on repeating any CAT scan or any procedure and I had personally discussed with Dr. Hassell Done of general surgery who has cleared him to go home.  He has already received 6 days of IV Zosyn.  We will prescribe him 5 more days of oral ciprofloxacin and Flagyl as well as Percocet.  Follow-up with PCP in few days.  Chest pain/bilateral PE: Initial CT angiogram of the chest was suspecting PE however it was motion degraded, patient was started on heparin.  Repeat CTA was done 48 hours after, on 09/04/2020 which ruled out PE, Doppler lower extremity ruled out DVT, heparin stopped.  Echo with normal ejection fraction with no wall motion abnormity.  Troponin negative.   New lung mass: CT angiogram of the chest also shows new lung mass.  PCCM consulted.  Seen by Dr. Lamonte Sakai, he is planning to do EUS/biopsy as outpatient.  His office will contact patient and schedule appointment.  Patient is aware.  All of the plan of care as mentioned above has been discussed with the patient and his wife over the phone and all questions answered.  Discharge  Diagnoses:  Principal Problem:   Acute diverticulitis Active Problems:   HYPERTENSION, BENIGN    Discharge Instructions   Allergies as of 09/06/2020   No Known Allergies      Medication List     TAKE these medications    BC HEADACHE PO Take 1 packet by mouth daily as needed (pain).   ciprofloxacin 500 MG tablet Commonly known as: Cipro Take 1 tablet (500 mg total) by mouth 2 (two) times daily for 5 days.   etodolac 500 MG  tablet Commonly known as: LODINE Take 500 mg by mouth 2 (two) times daily.   gabapentin 600 MG tablet Commonly known as: NEURONTIN Take 600 mg by mouth at bedtime.   losartan-hydrochlorothiazide 50-12.5 MG tablet Commonly known as: HYZAAR Take 1 tablet by mouth daily.   metroNIDAZOLE 500 MG tablet Commonly known as: Flagyl Take 1 tablet (500 mg total) by mouth 3 (three) times daily for 5 days.   MULTIPLE VITAMIN PO Take 1 tablet by mouth daily.   Osteo Bi-Flex Adv Joint Shield Tabs Take 1 tablet by mouth daily.   oxyCODONE-acetaminophen 5-325 MG tablet Commonly known as: Percocet Take 1-2 tablets by mouth every 4 (four) hours as needed for severe pain.        Follow-up Information     Osei-Bonsu, Iona Beard, MD Follow up in 1 week(s).   Specialty: Internal Medicine Contact information: [address] [place] [address] [phone]                No Known Allergies  Consultations: General surgery and PCCM   Procedures/Studies: CT Angio Chest Pulmonary Embolism (PE) W or WO Contrast  Result Date: 09/04/2020 CLINICAL DATA:  Chest pain, high probability for pulmonary embolus, shortness of breath EXAM: CT ANGIOGRAPHY CHEST WITH CONTRAST TECHNIQUE: Multidetector CT imaging of the chest was performed using the standard protocol during bolus administration of intravenous contrast. Multiplanar CT image reconstructions and MIPs were obtained to evaluate the vascular anatomy. CONTRAST:  57m OMNIPAQUE IOHEXOL 350 MG/ML SOLN COMPARISON:  09/02/2020 FINDINGS: Cardiovascular: Intact thoracic aorta. Negative for aneurysm or dissection. No mediastinal hemorrhage or hematoma. Patent 2 vessel arch anatomy. Central and proximal hilar pulmonary arteries are patent and normal in caliber. No significant central or proximal hilar PE. Limited assessment of the more peripheral small segmental branches to exclude small PE. Central venous structures are patent.  No  veno-occlusive process. Normal heart size.  No pericardial effusion. Mediastinum/Nodes: Atrophic left thyroid. Otherwise thyroid unremarkable. Trachea central airways are patent. Esophagus nondilated. Similar mild prominent AP window lymph node measures 3.1 cm in length but only 1 cm in short axis. Mildly enlarged left hilar lymph node measures 2.2 x 1.1 cm, unchanged. These are indeterminate for early nodal metastases as previously described. No contralateral or subcarinal adenopathy. No supraclavicular or axillary adenopathy. Lungs/Pleura: Apical small subpleural blebs noted. Peripheral left upper lobe masslike subpleural consolidation again measuring 4.1 x 3.4 cm. Adjacent atelectasis and or scarring noted. This is unchanged. Increased bibasilar atelectasis and new very small non loculated pleural effusions. Upper Abdomen: Cholelithiasis noted. Vicarious contrast excretion noted within the gallbladder from prior CT. No acute upper abdominal finding. Musculoskeletal: Degenerative changes noted of the spine. No acute osseous finding. Sternum intact. Review of the MIP images confirms the above findings. IMPRESSION: Negative for significant acute central or proximal hilar pulmonary embolus. Limited assessment of the smaller peripheral segmental branches to exclude small PE. Persistent 4.1 cm left upper lobe lobulated masslike consolidation with adjacent  AP window and left hilar mild adenopathy concerning for primary lung malignancy and early nodal disease. Increased bibasilar atelectasis and new developing trace pleural effusions. Cholelithiasis Electronically Signed   By: Jerilynn Mages.  Shick M.D.   On: 09/04/2020 09:37   CT Angio Chest Pulmonary Embolism (PE) W or WO Contrast  Result Date: 09/02/2020 CLINICAL DATA:  Shortness of breath, rule out PE EXAM: CT ANGIOGRAPHY CHEST WITH CONTRAST TECHNIQUE: Multidetector CT imaging of the chest was performed using the standard protocol during bolus administration of intravenous  contrast. Multiplanar CT image reconstructions and MIPs were obtained to evaluate the vascular anatomy. CONTRAST:  22m OMNIPAQUE IOHEXOL 350 MG/ML SOLN COMPARISON:  Chest radiograph, 05/20/2019 FINDINGS: Cardiovascular: Examination for pulmonary embolism is substantially limited by marginal contrast bolus, main pulmonary artery = 180 HU, as well as breath motion artifact. Within this limitation, findings are suspicious for segmental to subsegmental embolus in the lower lobes (series 1, image 75). Mild cardiomegaly. No pericardial effusion. Mediastinum/Nodes: Enlarged left hilar and AP window lymph nodes, measuring up to 2.3 x 1.5 cm (series 1, image 44). Thyroid gland, trachea, and esophagus demonstrate no significant findings. Lungs/Pleura: There is a lobulated masslike consolidation of the subpleural left upper lobe measuring 4.1 x 3.4 cm (series 4, image 37). There is a more wedge-shaped, heterogeneous opacity of the dependent left lower lobe; suspect that this is distal to embolus (series 4, image 9). Trace bilateral pleural effusions. Upper Abdomen: Please see forthcoming dedicated examination of abdomen and pelvis. Musculoskeletal: No chest wall abnormality. No acute or significant osseous findings. Review of the MIP images confirms the above findings. IMPRESSION: 1. Examination for pulmonary embolism is substantially limited by marginal contrast bolus and breath motion artifact. Within this limitation, findings are suspicious for segmental to subsegmental embolus in the lower lobes. Consider technical repeat examination to further evaluate. 2. There is a lobulated, masslike consolidation of the subpleural left upper lobe measuring 4.1 x 3.4 cm, highly concerning for primary lung malignancy. 3. Enlarged left hilar and AP window nodes, concerning for nodal metastatic disease. 4. There is a more wedge-shaped, heterogeneous opacity of the dependent left lower lobe; suspect that this is distal to embolus. This is  concerning for pulmonary infarction. 5. Trace bilateral pleural effusions. These results will be called to the ordering clinician or representative by the Radiologist Assistant, and communication documented in the PACS or [employer] Electronically Signed   By: AEddie CandleM.D.   On: 09/02/2020 13:47   CT ABDOMEN PELVIS W CONTRAST  Result Date: 09/02/2020 CLINICAL DATA:  Diverticulitis, complication suspected EXAM: CT ABDOMEN AND PELVIS WITH CONTRAST TECHNIQUE: Multidetector CT imaging of the abdomen and pelvis was performed using the standard protocol following bolus administration of intravenous contrast. CONTRAST:  885mOMNIPAQUE IOHEXOL 350 MG/ML SOLN COMPARISON:  CT abdomen pelvis, 09/01/2020 FINDINGS: Lower chest: Please see separately dictated examination of the chest. Hepatobiliary: No solid liver abnormality is seen. Faintly calcified gallstones in the gallbladder. No gallbladder wall thickening, or biliary dilatation. Pancreas: Unremarkable. No pancreatic ductal dilatation or surrounding inflammatory changes. Spleen: Normal in size without significant abnormality. Adrenals/Urinary Tract: Adrenal glands are unremarkable. Kidneys are normal, without renal calculi, solid lesion, or hydronephrosis. Bladder is unremarkable. Stomach/Bowel: Stomach is within normal limits. Appendix appears normal. Sigmoid diverticulosis. No significant change in severe wall thickening fat stranding about the distal descending and proximal mid sigmoid colon (series 5, image 78). There remains a low-attenuation fluid collection at the left aspect of the proximal sigmoid measuring approximately 3.4 x  2.2 cm (series 5, image 78, series 8, image 80). Vascular/Lymphatic: Aortic atherosclerosis. No enlarged abdominal or pelvic lymph nodes. Reproductive: No mass or other significant abnormality. Other: No abdominal wall hernia or abnormality. No abdominopelvic ascites. Musculoskeletal: No acute or significant osseous findings.  IMPRESSION: 1. No significant change in severe wall thickening fat and stranding about the distal descending and proximal mid sigmoid colon, consistent with acute diverticulitis. 2. There remains a low-attenuation fluid collection at the left aspect of the proximal sigmoid measuring approximately 3.4 x 2.2 cm, consistent with phlegmon or abscess. 3. Cholelithiasis. Aortic Atherosclerosis (ICD10-I70.0). Electronically Signed   By: Eddie Candle M.D.   On: 09/02/2020 13:52   CT Abdomen Pelvis W Contrast  Result Date: 09/01/2020 CLINICAL DATA:  Left lower quadrant pain for 3 days EXAM: CT ABDOMEN AND PELVIS WITH CONTRAST TECHNIQUE: Multidetector CT imaging of the abdomen and pelvis was performed using the standard protocol following bolus administration of intravenous contrast. CONTRAST:  36m OMNIPAQUE IOHEXOL 350 MG/ML SOLN COMPARISON:  05/21/2019 FINDINGS: Lower chest: Opacity with volume loss in the lower lobes consistent with atelectasis Hepatobiliary: No focal liver abnormality.Cholelithiasis. No evidence of acute cholecystitis. Pancreas: Unremarkable. Spleen: Unremarkable. Adrenals/Urinary Tract: Negative adrenals. No hydronephrosis or ureteral stone. Punctate left renal calculus. Unremarkable bladder. Stomach/Bowel: Segment of bowel wall thickening and mesenteric stranding at sigmoid colon. The same segment was affected previously when there was better demonstrated numerous colonic diverticula. A low-density component is seen along the anti mesenteric wall, most likely phlegmon. Diffuse colonic stool. Negative for bowel obstruction. No appendicitis Vascular/Lymphatic: Diffuse atheromatous calcification of the aorta and iliacs. No mass or adenopathy. Reproductive:No acute finding. Other: No ascites or pneumoperitoneum. Musculoskeletal: No acute abnormalities. Advanced lumbar spine degeneration with mild scoliosis. Spinal stenosis and foraminal impingement at L3-4 and L4-5 due to extensive endplate ridging.  IMPRESSION: 1. Sigmoid diverticulitis in the same distribution as seen by CT in 2021. Phlegmon is noted along the anti mesenteric wall without drainable fluid collection or pneumoperitoneum. 2. Cholelithiasis. 3. Atelectasis at the bases. Electronically Signed   By: JMonte FantasiaM.D.   On: 09/01/2020 04:48   ECHOCARDIOGRAM COMPLETE  Result Date: 09/04/2020    ECHOCARDIOGRAM REPORT   Patient Name:   RBRAEDEN KENNANDate of Exam: 09/04/2020 Medical Rec #:  [id number]      Height:       70.0 in Accession #:    [id number]     Weight:       297.8 lb Date of Birth:  807-17-1962      BSA:          2.472 m Patient Age:    89years        BP:           160/76 mmHg Patient Gender: M               HR:           81 bpm. Exam Location:  Inpatient Procedure: 2D Echo Indications:    pulmonary embolus  History:        Patient has prior history of Echocardiogram examinations, most                 recent 05/21/2019. Risk Factors:Hypertension.  Sonographer:    LJohny ChessRDCS Referring Phys: [id number] PSuring 1. Left ventricular ejection fraction, by estimation, is 65 to 70%. The left ventricle has normal function. The left ventricle has no regional wall motion abnormalities. There  is moderate concentric left ventricular hypertrophy. Left ventricular diastolic parameters were normal.  2. Right ventricular systolic function is normal. The right ventricular size is normal. Tricuspid regurgitation signal is inadequate for assessing PA pressure.  3. The mitral valve is normal in structure. No evidence of mitral valve regurgitation. No evidence of mitral stenosis.  4. The aortic valve was not well visualized. Aortic valve regurgitation is not visualized. No aortic stenosis is present.  5. The inferior vena cava is normal in size with greater than 50% respiratory variability, suggesting right atrial pressure of 3 mmHg. Comparison(s): A prior study was performed on 05/21/2019. LV is slightly more vigorous, aortic  sizing in this study is within normal limits for age and BSA. FINDINGS  Left Ventricle: Left ventricular ejection fraction, by estimation, is 65 to 70%. The left ventricle has normal function. The left ventricle has no regional wall motion abnormalities. The left ventricular internal cavity size was normal in size. There is  moderate concentric left ventricular hypertrophy. Left ventricular diastolic parameters were normal. Right Ventricle: The right ventricular size is normal. No increase in right ventricular wall thickness. Right ventricular systolic function is normal. Tricuspid regurgitation signal is inadequate for assessing PA pressure. Left Atrium: Left atrial size was normal in size. Right Atrium: Right atrial size was normal in size. Pericardium: There is no evidence of pericardial effusion. Mitral Valve: The mitral valve is normal in structure. No evidence of mitral valve regurgitation. No evidence of mitral valve stenosis. Tricuspid Valve: The tricuspid valve is normal in structure. Tricuspid valve regurgitation is not demonstrated. No evidence of tricuspid stenosis. Aortic Valve: The aortic valve was not well visualized. Aortic valve regurgitation is not visualized. No aortic stenosis is present. Pulmonic Valve: Discordance between color and spectral Doppler. The pulmonic valve was grossly normal. Pulmonic valve regurgitation is trivial. No evidence of pulmonic stenosis. Aorta: The aortic root and ascending aorta are structurally normal, with no evidence of dilitation. Venous: The inferior vena cava is normal in size with greater than 50% respiratory variability, suggesting right atrial pressure of 3 mmHg. IAS/Shunts: The atrial septum is grossly normal.  LEFT VENTRICLE PLAX 2D LVIDd:         5.40 cm  Diastology LVIDs:         3.30 cm  LV e' medial:    9.25 cm/s LV PW:         1.40 cm  LV E/e' medial:  9.6 LV IVS:        1.30 cm  LV e' lateral:   12.20 cm/s LVOT diam:     2.50 cm  LV E/e' lateral: 7.3 LV  SV:         129 LV SV Index:   52 LVOT Area:     4.91 cm  IVC IVC diam: 1.90 cm LEFT ATRIUM             Index       RIGHT ATRIUM           Index LA diam:        4.40 cm 1.78 cm/m  RA Area:     22.30 cm LA Vol (A2C):   83.2 ml 33.65 ml/m RA Volume:   63.50 ml  25.68 ml/m LA Vol (A4C):   73.3 ml 29.65 ml/m LA Biplane Vol: 82.6 ml 33.41 ml/m  AORTIC VALVE LVOT Vmax:   137.00 cm/s LVOT Vmean:  95.100 cm/s LVOT VTI:    0.262 m  AORTA Ao Root diam: 3.70  cm Ao Asc diam:  3.50 cm MITRAL VALVE MV Area (PHT): 3.60 cm    SHUNTS MV Decel Time: 211 msec    Systemic VTI:  0.26 m MV E velocity: 88.80 cm/s  Systemic Diam: 2.50 cm MV A velocity: 70.40 cm/s MV E/A ratio:  1.26 Rudean Haskell MD Electronically signed by Rudean Haskell MD Signature Date/Time: 09/04/2020/4:11:13 PM    Final    VAS [place] LOWER EXTREMITY VENOUS (DVT)  Result Date: 09/04/2020  Lower Venous DVT Study Patient Name:  MADDAX PALINKAS  Date of Exam:   09/04/2020 Medical Rec #: [id number]        Accession #:    [id number] Date of Birth: May 08, 1960        Patient Gender: M Patient Age:   57 years Exam Location:  [facility] Procedure:      VAS [place] LOWER EXTREMITY VENOUS (DVT) Referring Phys: Fady Stamps --------------------------------------------------------------------------------  Indications: Pulmonary embolism.  Comparison Study: 02/28/15 prior Performing Technologist: Archie Patten RVS  Examination Guidelines: A complete evaluation includes B-mode imaging, spectral Doppler, color Doppler, and power Doppler as needed of all accessible portions of each vessel. Bilateral testing is considered an integral part of a complete examination. Limited examinations for reoccurring indications may be performed as noted. The reflux portion of the exam is performed with the patient in reverse Trendelenburg.  +---------+---------------+---------+-----------+----------+--------------+ RIGHT     CompressibilityPhasicitySpontaneityPropertiesThrombus Aging +---------+---------------+---------+-----------+----------+--------------+ CFV      Full           Yes      Yes                                 +---------+---------------+---------+-----------+----------+--------------+ SFJ      Full                                                        +---------+---------------+---------+-----------+----------+--------------+ FV Prox  Full                                                        +---------+---------------+---------+-----------+----------+--------------+ FV Mid   Full                                                        +---------+---------------+---------+-----------+----------+--------------+ FV DistalFull                                                        +---------+---------------+---------+-----------+----------+--------------+ PFV      Full                                                        +---------+---------------+---------+-----------+----------+--------------+  POP      Full           Yes      Yes                                 +---------+---------------+---------+-----------+----------+--------------+ PTV      Full                                                        +---------+---------------+---------+-----------+----------+--------------+ PERO     Full                                                        +---------+---------------+---------+-----------+----------+--------------+   +---------+---------------+---------+-----------+----------+-------------------+ LEFT     CompressibilityPhasicitySpontaneityPropertiesThrombus Aging      +---------+---------------+---------+-----------+----------+-------------------+ CFV      Full           Yes      Yes                                      +---------+---------------+---------+-----------+----------+-------------------+ SFJ      Full                                                              +---------+---------------+---------+-----------+----------+-------------------+ FV Prox  Full                                                             +---------+---------------+---------+-----------+----------+-------------------+ FV Mid   Full                                                             +---------+---------------+---------+-----------+----------+-------------------+ FV DistalFull                                                             +---------+---------------+---------+-----------+----------+-------------------+ PFV      Full                                                             +---------+---------------+---------+-----------+----------+-------------------+ POP      Full  Yes      Yes                                      +---------+---------------+---------+-----------+----------+-------------------+ PTV      Full                                                             +---------+---------------+---------+-----------+----------+-------------------+ PERO                                                  Not well visualized +---------+---------------+---------+-----------+----------+-------------------+     Summary: RIGHT: - There is no evidence of deep vein thrombosis in the lower extremity.  - A cystic structure is found in the popliteal fossa.  LEFT: - There is no evidence of deep vein thrombosis in the lower extremity.  - No cystic structure found in the popliteal fossa.  *See table(s) above for measurements and observations. Electronically signed by Deitra Mayo MD on 09/04/2020 at 4:20:21 PM.    Final      Discharge Exam: Vitals:   09/06/20 0811 09/06/20 1312  BP: (!) 172/82 (!) 150/76  Pulse: 71 77  Resp: 18 18  Temp: 99.3 F (37.4 C) 98.3 F (36.8 C)  SpO2: 98%    Vitals:   09/06/20 0014 09/06/20 0525 09/06/20 0811 09/06/20 1312  BP: (!) 158/80 (!)  153/72 (!) 172/82 (!) 150/76  Pulse: 80 82 71 77  Resp:  _0 Temp:  98.4 F (36.9 C) 99.3 F (37.4 C) 98.3 F (36.8 C)  TempSrc:  Oral Oral Oral  SpO2:  94% 98%   Weight:      Height:        General: [name] alert, awake, not in acute distress Cardiovascular: RRR, S1/S2 +, no rubs, no gallops Respiratory: CTA bilaterally, no wheezing, no rhonchi Abdominal: Soft, NT, ND, bowel sounds + Extremities: no edema, no cyanosis    The results of significant diagnostics from this hospitalization (including imaging, microbiology, ancillary and laboratory) are listed below for reference.     Microbiology: Recent Results (from the past 240 hour(s))  Resp Panel by RT-PCR (Flu A&B, Covid) Nasopharyngeal Swab     Status: None   Collection Time: 09/01/20  5:00 AM   Specimen: Nasopharyngeal Swab; Nasopharyngeal(NP) swabs in vial transport medium  Result Value Ref Range Status   SARS Coronavirus 2 by RT PCR NEGATIVE NEGATIVE Final    Comment: (NOTE) SARS-CoV-2 target nucleic acids are NOT DETECTED.  The SARS-CoV-2 RNA is generally detectable in upper respiratory specimens during the acute phase of infection. The lowest concentration of SARS-CoV-2 viral copies this assay can detect is 138 copies/mL. A negative result does not preclude SARS-Cov-2 infection and should not be used as the sole basis for treatment or other patient management decisions. A negative result may occur with  improper specimen collection/handling, submission of specimen other than nasopharyngeal swab, presence of viral mutation(s) within the areas targeted by this assay, and inadequate number of viral copies(<138 copies/mL). A negative result must be combined with clinical observations, patient history,  and epidemiological information. The expected result is Negative.  Fact Sheet for Patients:  EntrepreneurPulse.com.au  Fact Sheet for Healthcare Providers:   IncredibleEmployment.be  This test is no t yet approved or cleared by the [place] FDA and  has been authorized for detection and/or diagnosis of SARS-CoV-2 by FDA under an Emergency Use Authorization (EUA). This EUA will remain  in effect (meaning this test can be used) for the duration of the COVID-19 declaration under Section 564(b)(1) of the Act, 21 U.S.C.section 360bbb-3(b)(1), unless the authorization is terminated  or revoked sooner.       Influenza A by PCR NEGATIVE NEGATIVE Final   Influenza B by PCR NEGATIVE NEGATIVE Final    Comment: (NOTE) The Xpert Xpress SARS-CoV-2/FLU/RSV plus assay is intended as an aid in the diagnosis of influenza from Nasopharyngeal swab specimens and should not be used as a sole basis for treatment. Nasal washings and aspirates are unacceptable for Xpert Xpress SARS-CoV-2/FLU/RSV testing.  Fact Sheet for Patients: EntrepreneurPulse.com.au  Fact Sheet for Healthcare Providers: IncredibleEmployment.be  This test is not yet approved or cleared by the [place] FDA and has been authorized for detection and/or diagnosis of SARS-CoV-2 by FDA under an Emergency Use Authorization (EUA). This EUA will remain in effect (meaning this test can be used) for the duration of the COVID-19 declaration under Section 564(b)(1) of the Act, 21 U.S.C. section 360bbb-3(b)(1), unless the authorization is terminated or revoked.  Performed at [facility], [place] [address] [place], [place] [address]      Labs: BNP (last 3 results) No results for input(s): BNP in the last 8760 hours. Basic Metabolic Panel: Recent Labs  Lab 08/31/20 1516 09/01/20 0604 09/02/20 0018 09/03/20 0552 09/04/20 0026 09/06/20 0221  NA 139  --  136 136 138 139  K 4.0  --  3.7 3.7 4.0 3.1*  CL 106  --  104 105 106 108  CO2 26  --  _0 GLUCOSE 106*  --  95 101* 94 103*  BUN 11  --  _1 CREATININE 0.82 1.00 0.88 0.85 0.82 0.77  CALCIUM 8.9  --  8.5* 8.4* 8.6* 8.6*  MG  --   --   --  2.0 2.2  --    Liver Function Tests: Recent Labs  Lab 08/31/20 1516  AST 20  ALT 20  ALKPHOS 73  BILITOT 1.2  PROT 6.9  ALBUMIN 3.4*   Recent Labs  Lab 08/31/20 1516  LIPASE 21   No results for input(s): AMMONIA in the last 168 hours. CBC: Recent Labs  Lab 08/31/20 1516 09/01/20 0604 09/02/20 0018 09/03/20 0552 09/04/20 0026 09/06/20 0221  WBC 15.5* 15.3* 13.6* 13.6* 13.5* 11.1*  NEUTROABS 12.5*  --   --  10.2* 10.8* 8.1*  HGB 14.3 12.4* 13.0 12.1* 12.3* 12.3*  HCT 44.6 40.0 39.7 36.6* 39.0 37.6*  MCV 88.7 90.5 88.0 86.9 89.9 87.6  PLT 286 254 226 230 254 270   Cardiac Enzymes: No results for input(s): CKTOTAL, CKMB, CKMBINDEX, TROPONINI in the last 168 hours. BNP: Invalid input(s): POCBNP CBG: Recent Labs  Lab 09/04/20 2344 09/05/20 0734 09/05/20 1602 09/06/20 0024 09/06/20 0805  GLUCAP 79 91 87 93 100*   D-Dimer No results for input(s): DDIMER in the last 72 hours. Hgb A1c No results for input(s): HGBA1C in the last 72 hours. Lipid Profile No results for input(s): CHOL, HDL, LDLCALC, TRIG, CHOLHDL, LDLDIRECT in the last 72 hours.  Thyroid function studies No results for input(s): TSH, T4TOTAL, T3FREE, THYROIDAB in the last 72 hours.  Invalid input(s): FREET3 Anemia work up No results for input(s): VITAMINB12, FOLATE, FERRITIN, TIBC, IRON, RETICCTPCT in the last 72 hours. Urinalysis    Component Value Date/Time   COLORURINE AMBER (A) 08/31/2020 1921   APPEARANCEUR HAZY (A) 08/31/2020 1921   LABSPEC 1.024 08/31/2020 1921   PHURINE 7.0 08/31/2020 1921   GLUCOSEU NEGATIVE 08/31/2020 1921   HGBUR NEGATIVE 08/31/2020 1921   HGBUR moderate 10/17/2007 [place] 08/31/2020 [place] NEGATIVE 08/31/2020 1921   PROTEINUR 30 (A) 08/31/2020 1921   UROBILINOGEN 1.0 07/17/2013 1048   NITRITE NEGATIVE 08/31/2020 1921   LEUKOCYTESUR  NEGATIVE 08/31/2020 1921   Sepsis Labs Invalid input(s): PROCALCITONIN,  WBC,  LACTICIDVEN Microbiology Recent Results (from the past 240 hour(s))  Resp Panel by RT-PCR (Flu A&B, Covid) Nasopharyngeal Swab     Status: None   Collection Time: 09/01/20  5:00 AM   Specimen: Nasopharyngeal Swab; Nasopharyngeal(NP) swabs in vial transport medium  Result Value Ref Range Status   SARS Coronavirus 2 by RT PCR NEGATIVE NEGATIVE Final    Comment: (NOTE) SARS-CoV-2 target nucleic acids are NOT DETECTED.  The SARS-CoV-2 RNA is generally detectable in upper respiratory specimens during the acute phase of infection. The lowest concentration of SARS-CoV-2 viral copies this assay can detect is 138 copies/mL. A negative result does not preclude SARS-Cov-2 infection and should not be used as the sole basis for treatment or other patient management decisions. A negative result may occur with  improper specimen collection/handling, submission of specimen other than nasopharyngeal swab, presence of viral mutation(s) within the areas targeted by this assay, and inadequate number of viral copies(<138 copies/mL). A negative result must be combined with clinical observations, patient history, and epidemiological information. The expected result is Negative.  Fact Sheet for Patients:  EntrepreneurPulse.com.au  Fact Sheet for Healthcare Providers:  IncredibleEmployment.be  This test is no t yet approved or cleared by the [place] FDA and  has been authorized for detection and/or diagnosis of SARS-CoV-2 by FDA under an Emergency Use Authorization (EUA). This EUA will remain  in effect (meaning this test can be used) for the duration of the COVID-19 declaration under Section 564(b)(1) of the Act, 21 U.S.C.section 360bbb-3(b)(1), unless the authorization is terminated  or revoked sooner.       Influenza A by PCR NEGATIVE NEGATIVE Final   Influenza B by PCR  NEGATIVE NEGATIVE Final    Comment: (NOTE) The Xpert Xpress SARS-CoV-2/FLU/RSV plus assay is intended as an aid in the diagnosis of influenza from Nasopharyngeal swab specimens and should not be used as a sole basis for treatment. Nasal washings and aspirates are unacceptable for Xpert Xpress SARS-CoV-2/FLU/RSV testing.  Fact Sheet for Patients: EntrepreneurPulse.com.au  Fact Sheet for Healthcare Providers: IncredibleEmployment.be  This test is not yet approved or cleared by the [place] FDA and has been authorized for detection and/or diagnosis of SARS-CoV-2 by FDA under an Emergency Use Authorization (EUA). This EUA will remain in effect (meaning this test can be used) for the duration of the COVID-19 declaration under Section 564(b)(1) of the Act, 21 U.S.C. section 360bbb-3(b)(1), unless the authorization is terminated or revoked.  Performed at [facility], [place] [address] [place], [place] [address]      Time coordinating discharge: Over 30 minutes  SIGNED:   Darliss Cheney, MD  [facility] 09/06/2020, 2:59 PM  If 7PM-7AM, please contact  night-coverage [email or web address]

## 2020-09-06 NOTE — Progress Notes (Addendum)
Patient ID: Raymond [name], male   DOB: [birth year], 60 y.o.   MRN: [id number] [facility] Surgery Progress Note:   * No surgery found *  Subjective: Mental status is alert.  Complaints minimal. Objective: Vital signs in last 24 hours: Temp:  [98.4 F (36.9 C)-99.3 F (37.4 C)] 99.3 F (37.4 C) (09/04 0811) Pulse Rate:  [71-83] 71 (09/04 0811) Resp:  [18-19] 18 (09/04 0811) BP: (153-181)/(72-82) 172/82 (09/04 0811) SpO2:  [94 %-98 %] 98 % (09/04 0811)  Intake/Output from previous day: 09/03 0701 - 09/04 0700 In: 1923.2 [P.O.:240; I.V.:1250; IV Piggyback:433.2] Out: 1675 [Urine:1675] Intake/Output this shift: No intake/output data recorded.  Physical Exam: Work of breathing is OK.  Passing flatus and waiting on BM  Lab Results:  Results for orders placed or performed during the hospital encounter of 08/31/20 (from the past 48 hour(s))  Heparin level (unfractionated)     Status: None   Collection Time: 09/04/20 10:46 AM  Result Value Ref Range   Heparin Unfractionated 0.66 0.30 - 0.70 IU/mL    Comment: (NOTE) The clinical reportable range upper limit is being lowered to >1.10 to align with the FDA approved guidance for the current laboratory assay.  If heparin results are below expected values, and patient dosage has  been confirmed, suggest follow up testing of antithrombin III levels. Performed at [facility], [place] [address] [place], [place] [address]   Glucose, capillary     Status: None   Collection Time: 09/04/20  4:26 PM  Result Value Ref Range   Glucose-Capillary 94 70 - 99 mg/dL    Comment: Glucose reference range applies only to samples taken after fasting for at least 8 hours.  Glucose, capillary     Status: None   Collection Time: 09/04/20 11:44 PM  Result Value Ref Range   Glucose-Capillary 79 70 - 99 mg/dL    Comment: Glucose reference range applies only to samples taken after fasting for at least 8 hours.  Glucose, capillary     Status: None    Collection Time: 09/05/20  7:34 AM  Result Value Ref Range   Glucose-Capillary 91 70 - 99 mg/dL    Comment: Glucose reference range applies only to samples taken after fasting for at least 8 hours.  Glucose, capillary     Status: None   Collection Time: 09/05/20  4:02 PM  Result Value Ref Range   Glucose-Capillary 87 70 - 99 mg/dL    Comment: Glucose reference range applies only to samples taken after fasting for at least 8 hours.  Glucose, capillary     Status: None   Collection Time: 09/06/20 12:24 AM  Result Value Ref Range   Glucose-Capillary 93 70 - 99 mg/dL    Comment: Glucose reference range applies only to samples taken after fasting for at least 8 hours.  CBC with Differential/Platelet     Status: Abnormal   Collection Time: 09/06/20  2:21 AM  Result Value Ref Range   WBC 11.1 (H) 4.0 - 10.5 K/uL   RBC 4.29 4.22 - 5.81 MIL/uL   Hemoglobin 12.3 (L) 13.0 - 17.0 g/dL   HCT 37.6 (L) 39.0 - 52.0 %   MCV 87.6 80.0 - 100.0 fL   MCH 28.7 26.0 - 34.0 pg   MCHC 32.7 30.0 - 36.0 g/dL   RDW 12.7 11.5 - 15.5 %   Platelets 270 150 - 400 K/uL   nRBC 0.0 0.0 - 0.2 %   Neutrophils Relative % 73 %  Neutro Abs 8.1 (H) 1.7 - 7.7 K/uL   Lymphocytes Relative 13 %   Lymphs Abs 1.4 0.7 - 4.0 K/uL   Monocytes Relative 9 %   Monocytes Absolute 1.0 0.1 - 1.0 K/uL   Eosinophils Relative 4 %   Eosinophils Absolute 0.5 0.0 - 0.5 K/uL   Basophils Relative 0 %   Basophils Absolute 0.1 0.0 - 0.1 K/uL   Immature Granulocytes 1 %   Abs Immature Granulocytes 0.10 (H) 0.00 - 0.07 K/uL    Comment: Performed at [facility] [address] [place], [place] [address]  Basic metabolic panel     Status: Abnormal   Collection Time: 09/06/20  2:21 AM  Result Value Ref Range   Sodium 139 135 - 145 mmol/L   Potassium 3.1 (L) 3.5 - 5.1 mmol/L   Chloride 108 98 - 111 mmol/L   CO2 23 22 - 32 mmol/L   Glucose, Bld 103 (H) 70 - 99 mg/dL    Comment: Glucose reference range applies only to samples taken  after fasting for at least 8 hours.   BUN 12 6 - 20 mg/dL   Creatinine, Ser 0.77 0.61 - 1.24 mg/dL   Calcium 8.6 (L) 8.9 - 10.3 mg/dL   GFR, Estimated >60 >60 mL/min    Comment: (NOTE) Calculated using the CKD-EPI Creatinine Equation (2021)    Anion gap 8 5 - 15    Comment: Performed at [facility] [address] [place], [place] [address]  Glucose, capillary     Status: Abnormal   Collection Time: 09/06/20  8:05 AM  Result Value Ref Range   Glucose-Capillary 100 (H) 70 - 99 mg/dL    Comment: Glucose reference range applies only to samples taken after fasting for at least 8 hours.    Radiology/Results: ECHOCARDIOGRAM COMPLETE  Result Date: 09/04/2020    ECHOCARDIOGRAM REPORT   Patient Name:   Raymond [name] Date of Exam: 09/04/2020 Medical Rec #:  [id number]       Height:       70.0 in Accession #:    [id number]      Weight:       297.8 lb Date of Birth:  Jan 20, 1960       BSA:          2.472 m Patient Age:    89 years        BP:           160/76 mmHg Patient Gender: M               HR:           81 bpm. Exam Location:  Inpatient Procedure: 2D Echo Indications:    pulmonary embolus  History:        Patient has prior history of Echocardiogram examinations, most                 recent 05/21/2019. Risk Factors:Hypertension.  Sonographer:    Johny Chess RDCS Referring Phys: [id number] RAVI [place]  1. Left ventricular ejection fraction, by estimation, is 65 to 70%. The left ventricle has normal function. The left ventricle has no regional wall motion abnormalities. There is moderate concentric left ventricular hypertrophy. Left ventricular diastolic parameters were normal.  2. Right ventricular systolic function is normal. The right ventricular size is normal. Tricuspid regurgitation signal is inadequate for assessing PA pressure.  3. The mitral valve is normal in structure. No evidence of mitral valve regurgitation. No  evidence of mitral stenosis.  4. The aortic valve was not well  visualized. Aortic valve regurgitation is not visualized. No aortic stenosis is present.  5. The inferior vena cava is normal in size with greater than 50% respiratory variability, suggesting right atrial pressure of 3 mmHg. Comparison(s): A prior study was performed on 05/21/2019. LV is slightly more vigorous, aortic sizing in this study is within normal limits for age and BSA. FINDINGS  Left Ventricle: Left ventricular ejection fraction, by estimation, is 65 to 70%. The left ventricle has normal function. The left ventricle has no regional wall motion abnormalities. The left ventricular internal cavity size was normal in size. There is  moderate concentric left ventricular hypertrophy. Left ventricular diastolic parameters were normal. Right Ventricle: The right ventricular size is normal. No increase in right ventricular wall thickness. Right ventricular systolic function is normal. Tricuspid regurgitation signal is inadequate for assessing PA pressure. Left Atrium: Left atrial size was normal in size. Right Atrium: Right atrial size was normal in size. Pericardium: There is no evidence of pericardial effusion. Mitral Valve: The mitral valve is normal in structure. No evidence of mitral valve regurgitation. No evidence of mitral valve stenosis. Tricuspid Valve: The tricuspid valve is normal in structure. Tricuspid valve regurgitation is not demonstrated. No evidence of tricuspid stenosis. Aortic Valve: The aortic valve was not well visualized. Aortic valve regurgitation is not visualized. No aortic stenosis is present. Pulmonic Valve: Discordance between color and spectral Doppler. The pulmonic valve was grossly normal. Pulmonic valve regurgitation is trivial. No evidence of pulmonic stenosis. Aorta: The aortic root and ascending aorta are structurally normal, with no evidence of dilitation. Venous: The inferior vena cava is normal in size with greater than 50% respiratory variability, suggesting right atrial  pressure of 3 mmHg. IAS/Shunts: The atrial septum is grossly normal.  LEFT VENTRICLE PLAX 2D LVIDd:         5.40 cm  Diastology LVIDs:         3.30 cm  LV e' medial:    9.25 cm/s LV PW:         1.40 cm  LV E/e' medial:  9.6 LV IVS:        1.30 cm  LV e' lateral:   12.20 cm/s LVOT diam:     2.50 cm  LV E/e' lateral: 7.3 LV SV:         129 LV SV Index:   52 LVOT Area:     4.91 cm  IVC IVC diam: 1.90 cm LEFT ATRIUM             Index       RIGHT ATRIUM           Index LA diam:        4.40 cm 1.78 cm/m  RA Area:     22.30 cm LA Vol (A2C):   83.2 ml 33.65 ml/m RA Volume:   63.50 ml  25.68 ml/m LA Vol (A4C):   73.3 ml 29.65 ml/m LA Biplane Vol: 82.6 ml 33.41 ml/m  AORTIC VALVE LVOT Vmax:   137.00 cm/s LVOT Vmean:  95.100 cm/s LVOT VTI:    0.262 m  AORTA Ao Root diam: 3.70 cm Ao Asc diam:  3.50 cm MITRAL VALVE MV Area (PHT): 3.60 cm    SHUNTS MV Decel Time: 211 msec    Systemic VTI:  0.26 m MV E velocity: 88.80 cm/s  Systemic Diam: 2.50 cm MV A velocity: 70.40 cm/s MV E/A ratio:  1.26  Rudean Haskell MD Electronically signed by Rudean Haskell MD Signature Date/Time: 09/04/2020/4:11:13 PM    Final    VAS [place] LOWER EXTREMITY VENOUS (DVT)  Result Date: 09/04/2020  Lower Venous DVT Study Patient Name:  SCOTTY WEIGELT  Date of Exam:   09/04/2020 Medical Rec #: [id number]        Accession #:    [id number] Date of Birth: August 26, 1960        Patient Gender: M Patient Age:   61 years Exam [facility]  [facility] Procedure:      VAS [place] LOWER EXTREMITY VENOUS (DVT) Referring Phys: RAVI PAHWANI --------------------------------------------------------------------------------  Indications: Pulmonary embolism.  Comparison Study: 02/28/15 prior Performing Technologist: Archie Patten RVS  Examination Guidelines: A complete evaluation includes B-mode imaging, spectral Doppler, color Doppler, and power Doppler as needed of all accessible portions of each vessel. Bilateral testing is considered an integral part of a  complete examination. Limited examinations for reoccurring indications may be performed as noted. The reflux portion of the exam is performed with the patient in reverse Trendelenburg.  +---------+---------------+---------+-----------+----------+--------------+ RIGHT    CompressibilityPhasicitySpontaneityPropertiesThrombus Aging +---------+---------------+---------+-----------+----------+--------------+ CFV      Full           Yes      Yes                                 +---------+---------------+---------+-----------+----------+--------------+ SFJ      Full                                                        +---------+---------------+---------+-----------+----------+--------------+ FV Prox  Full                                                        +---------+---------------+---------+-----------+----------+--------------+ FV Mid   Full                                                        +---------+---------------+---------+-----------+----------+--------------+ FV DistalFull                                                        +---------+---------------+---------+-----------+----------+--------------+ PFV      Full                                                        +---------+---------------+---------+-----------+----------+--------------+ POP      Full           Yes      Yes                                 +---------+---------------+---------+-----------+----------+--------------+  PTV      Full                                                        +---------+---------------+---------+-----------+----------+--------------+ PERO     Full                                                        +---------+---------------+---------+-----------+----------+--------------+   +---------+---------------+---------+-----------+----------+-------------------+ LEFT     CompressibilityPhasicitySpontaneityPropertiesThrombus Aging       +---------+---------------+---------+-----------+----------+-------------------+ CFV      Full           Yes      Yes                                      +---------+---------------+---------+-----------+----------+-------------------+ SFJ      Full                                                             +---------+---------------+---------+-----------+----------+-------------------+ FV Prox  Full                                                             +---------+---------------+---------+-----------+----------+-------------------+ FV Mid   Full                                                             +---------+---------------+---------+-----------+----------+-------------------+ FV DistalFull                                                             +---------+---------------+---------+-----------+----------+-------------------+ PFV      Full                                                             +---------+---------------+---------+-----------+----------+-------------------+ POP      Full           Yes      Yes                                      +---------+---------------+---------+-----------+----------+-------------------+ PTV  Full                                                             +---------+---------------+---------+-----------+----------+-------------------+ PERO                                                  Not well visualized +---------+---------------+---------+-----------+----------+-------------------+     Summary: RIGHT: - There is no evidence of deep vein thrombosis in the lower extremity.  - A cystic structure is found in the popliteal fossa.  LEFT: - There is no evidence of deep vein thrombosis in the lower extremity.  - No cystic structure found in the popliteal fossa.  *See table(s) above for measurements and observations. Electronically signed by Deitra Mayo MD on 09/04/2020 at 4:20:21 PM.     Final     Anti-infectives: Anti-infectives (From admission, onward)    Start     Dose/Rate Route Frequency Ordered Stop   09/01/20 1200  piperacillin-tazobactam (ZOSYN) IVPB 3.375 g        3.375 g 12.5 mL/hr over 240 Minutes Intravenous Every 8 hours 09/01/20 0604     09/01/20 0500  piperacillin-tazobactam (ZOSYN) IVPB 3.375 g        3.375 g 100 mL/hr over 30 Minutes Intravenous  Once 09/01/20 0456 09/01/20 0540       Assessment/Plan: Problem List: Patient Active Problem List   Diagnosis Date Noted   Acute diverticulitis 09/01/2020   Hx of diverticulitis of colon 05/21/2019   Paraplegia, 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  OK for discharge from surgery standpoint.   * No surgery found *    LOS: 5 days   Matt B. Hassell Done, MD, [facility] [phone] to reach the surgeon on call.    09/06/2020 9:25 AM

## 2020-09-06 NOTE — Progress Notes (Signed)
Discharge instructions given to patient. Patient Ivs removed and belongings collected. Patient discharged

## 2020-09-08 ENCOUNTER — Telehealth: Payer: Self-pay | Admitting: Emergency Medicine

## 2020-09-08 NOTE — Telephone Encounter (Signed)
Spoke to pt & gave him appt info.

## 2020-09-08 NOTE — Telephone Encounter (Signed)
I sched pt for 9/12 at 12:15 at [facility].  I spoke to [place] at [facility] & she is going to burn disk.  Pt will go for covid test on 9/9.  I called & left a vm for him to call me back for appt info.

## 2020-09-11 ENCOUNTER — Other Ambulatory Visit: Payer: Self-pay | Admitting: Emergency Medicine

## 2020-09-11 ENCOUNTER — Other Ambulatory Visit: Payer: Self-pay

## 2020-09-11 ENCOUNTER — Encounter (HOSPITAL_COMMUNITY): Payer: Self-pay | Admitting: Emergency Medicine

## 2020-09-11 NOTE — Progress Notes (Signed)
Spoke with pt for pre-op call. Pt has hx of a MI in 2006. Denies any recent chest pain or sob. [name] treated for HTN, but is not diabetic. Pt recently in the hospital for Diverticulitis.  Covid test done today.

## 2020-09-12 LAB — SARS CORONAVIRUS 2 (TAT 6-24 HRS): SARS Coronavirus 2: NEGATIVE

## 2020-09-14 ENCOUNTER — Ambulatory Visit (HOSPITAL_COMMUNITY): Payer: Self-pay | Admitting: Certified Registered Nurse Anesthetist

## 2020-09-14 ENCOUNTER — Other Ambulatory Visit: Payer: Self-pay

## 2020-09-14 ENCOUNTER — Encounter (HOSPITAL_COMMUNITY): Payer: Self-pay | Admitting: Emergency Medicine

## 2020-09-14 ENCOUNTER — Ambulatory Visit (HOSPITAL_COMMUNITY): Payer: Self-pay

## 2020-09-14 ENCOUNTER — Ambulatory Visit (HOSPITAL_COMMUNITY)
Admission: RE | Admit: 2020-09-14 | Discharge: 2020-09-14 | Disposition: A | Discharge status: Home / Self Care | Payer: Self-pay | Attending: Emergency Medicine | Admitting: Emergency Medicine

## 2020-09-14 ENCOUNTER — Encounter (HOSPITAL_COMMUNITY)
Admission: RE | Disposition: A | Discharge status: Home / Self Care | Payer: Self-pay | Source: Home / Self Care | Attending: Emergency Medicine

## 2020-09-14 DIAGNOSIS — Z9889 Other specified postprocedural states: Secondary | ICD-10-CM

## 2020-09-14 DIAGNOSIS — C3412 Malignant neoplasm of upper lobe, left bronchus or lung: Secondary | ICD-10-CM | POA: Insufficient documentation

## 2020-09-14 DIAGNOSIS — R590 Localized enlarged lymph nodes: Secondary | ICD-10-CM | POA: Insufficient documentation

## 2020-09-14 DIAGNOSIS — R918 Other nonspecific abnormal finding of lung field: Secondary | ICD-10-CM | POA: Diagnosis present

## 2020-09-14 DIAGNOSIS — Z419 Encounter for procedure for purposes other than remedying health state, unspecified: Secondary | ICD-10-CM

## 2020-09-14 DIAGNOSIS — F17210 Nicotine dependence, cigarettes, uncomplicated: Secondary | ICD-10-CM | POA: Insufficient documentation

## 2020-09-14 DIAGNOSIS — Z800 Family history of malignant neoplasm of digestive organs: Secondary | ICD-10-CM | POA: Insufficient documentation

## 2020-09-14 HISTORY — PX: FIDUCIAL MARKER PLACEMENT: SHX6858

## 2020-09-14 HISTORY — DX: Personal history of other diseases of the digestive system: Z87.19

## 2020-09-14 HISTORY — PX: VIDEO BRONCHOSCOPY: SHX5072

## 2020-09-14 HISTORY — PX: VIDEO BRONCHOSCOPY WITH ENDOBRONCHIAL ULTRASOUND: SHX6177

## 2020-09-14 HISTORY — DX: Sleep apnea, unspecified: G47.30

## 2020-09-14 HISTORY — DX: Pneumonia, unspecified organism: J18.9

## 2020-09-14 HISTORY — PX: BRONCHIAL BIOPSY: SHX5109

## 2020-09-14 HISTORY — PX: VIDEO BRONCHOSCOPY WITH RADIAL ENDOBRONCHIAL ULTRASOUND: SHX6849

## 2020-09-14 HISTORY — DX: Gastro-esophageal reflux disease without esophagitis: K21.9

## 2020-09-14 HISTORY — PX: BRONCHIAL NEEDLE ASPIRATION BIOPSY: SHX5106

## 2020-09-14 HISTORY — PX: BRONCHIAL BRUSHINGS: SHX5108

## 2020-09-14 SURGERY — BRONCHOSCOPY, WITH EBUS
Anesthesia: General

## 2020-09-14 MED ORDER — DEXAMETHASONE SODIUM PHOSPHATE 10 MG/ML IJ SOLN
INTRAMUSCULAR | Status: DC | PRN
  Administered 2020-09-14: 10 mg via INTRAVENOUS

## 2020-09-14 MED ORDER — LIDOCAINE 2% (20 MG/ML) 5 ML SYRINGE
INTRAMUSCULAR | Status: DC | PRN
  Administered 2020-09-14: 100 mg via INTRAVENOUS

## 2020-09-14 MED ORDER — SUGAMMADEX SODIUM 200 MG/2ML IV SOLN
INTRAVENOUS | Status: DC | PRN
  Administered 2020-09-14: 300 mg via INTRAVENOUS

## 2020-09-14 MED ORDER — FENTANYL CITRATE (PF) 100 MCG/2ML IJ SOLN
INTRAMUSCULAR | Status: AC
  Filled 2020-09-14: qty 2

## 2020-09-14 MED ORDER — ROCURONIUM BROMIDE 10 MG/ML (PF) SYRINGE
PREFILLED_SYRINGE | INTRAVENOUS | Status: DC | PRN
Start: 2020-09-14 — End: 2020-09-14
  Administered 2020-09-14: 10 mg via INTRAVENOUS
  Administered 2020-09-14: 60 mg via INTRAVENOUS

## 2020-09-14 MED ORDER — FENTANYL CITRATE (PF) 250 MCG/5ML IJ SOLN
INTRAMUSCULAR | Status: DC | PRN
  Administered 2020-09-14: 100 ug via INTRAVENOUS

## 2020-09-14 MED ORDER — EPHEDRINE SULFATE-NACL 50-0.9 MG/10ML-% IV SOSY
PREFILLED_SYRINGE | INTRAVENOUS | Status: DC | PRN
  Administered 2020-09-14 (×2): 5 mg via INTRAVENOUS

## 2020-09-14 MED ORDER — PROPOFOL 10 MG/ML IV BOLUS
INTRAVENOUS | Status: DC | PRN
  Administered 2020-09-14: 200 mg via INTRAVENOUS

## 2020-09-14 MED ORDER — CHLORHEXIDINE GLUCONATE 0.12 % MT SOLN
OROMUCOSAL | Status: AC
  Filled 2020-09-14: qty 15

## 2020-09-14 MED ORDER — LACTATED RINGERS IV SOLN: INTRAVENOUS | Status: DC

## 2020-09-14 MED ORDER — ONDANSETRON HCL 4 MG/2ML IJ SOLN
INTRAMUSCULAR | Status: DC | PRN
  Administered 2020-09-14: 4 mg via INTRAVENOUS

## 2020-09-14 SURGICAL SUPPLY — 1 items: fiducial marker ×1 IMPLANT

## 2020-09-14 NOTE — Transfer of Care (Signed)
Immediate Anesthesia Transfer of Care Note  Patient: Raymond [name]  Procedure(s) Performed: VIDEO BRONCHOSCOPY WITH ENDOBRONCHIAL ULTRASOUND ROBOTIC VIDEO BRONCHOSCOPY WITH FLUORO BRONCHIAL NEEDLE ASPIRATION BIOPSIES BRONCHIAL BRUSHINGS BRONCHIAL BIOPSIES FIDUCIAL MARKER PLACEMENT VIDEO BRONCHOSCOPY WITH RADIAL ENDOBRONCHIAL ULTRASOUND  Patient Location: PACU, endo  Anesthesia Type:General  Level of Consciousness: patient cooperative and responds to stimulation  Airway & Oxygen Therapy: Patient Spontanous Breathing and Patient connected to nasal cannula oxygen  Post-op Assessment: Report given to RN and Post -op Vital signs reviewed and stable  Post vital signs: Reviewed and stable  Last Vitals:  Vitals Value Taken Time  BP 163/85 09/14/20 1411  Temp 36.6 C 09/14/20 1409  Pulse 83 09/14/20 1413  Resp 18 09/14/20 1413  SpO2 91 % 09/14/20 1413  Vitals shown include unvalidated device data.  Last Pain:  Vitals:   09/14/20 1409  TempSrc: Temporal  PainSc: Asleep      Patients Stated Pain Goal: 1 (54/65/03 [id number])  Complications: No notable events documented.

## 2020-09-14 NOTE — Interval H&P Note (Signed)
History and Physical Interval Note:  09/14/2020 10:18 AM  Raymond [name]  has presented today for surgery, with the diagnosis of MASS OF LEFT LUNG.  The various methods of treatment have been discussed with the patient and family. After consideration of risks, benefits and other options for treatment, the patient has consented to  Procedure(s): VIDEO BRONCHOSCOPY WITH ENDOBRONCHIAL ULTRASOUND (N/A) ROBOTIC VIDEO BRONCHOSCOPY WITH FLUORO (N/A) as a surgical intervention.  The patient's history has been reviewed, patient examined, no change in status, stable for surgery.  I have reviewed the patient's chart and labs.  Questions were answered to the patient's satisfaction.     Collene Gobble

## 2020-09-14 NOTE — Op Note (Signed)
Video Bronchoscopy with Robotic Assisted Bronchoscopic Navigation, Endobronchial Ultrasound Procedure Note  Date of Operation: 09/14/2020   Pre-op Diagnosis: Left upper lobe mass, mediastinal and hilar adenopathy  Post-op Diagnosis: Same  Surgeon: Baltazar Apo  Assistants: None  Anesthesia: General endotracheal anesthesia  Operation: Flexible video fiberoptic bronchoscopy with robotic assistance and biopsies.  Estimated Blood Loss: Minimal  Complications: None  Indications and History: Raymond [name] is a 60 y.o. male with history of tobacco use.  He was admitted to the hospital with diverticulitis in late August.  Imaging also revealed a new left upper lobe mass, left hilar and mediastinal adenopathy.  Recommendation was made to achieve tissue diagnosis via robotic assisted navigational bronchoscopy, endobronchial ultrasound and biopsies. The risks, benefits, complications, treatment options and expected outcomes were discussed with the patient.  The possibilities of pneumothorax, pneumonia, reaction to medication, pulmonary aspiration, perforation of a viscus, bleeding, failure to diagnose a condition and creating a complication requiring transfusion or operation were discussed with the patient who freely signed the consent.    Description of Procedure: The patient was seen in the Preoperative Area, was examined and was deemed appropriate to proceed.  The patient was taken to [facility] endoscopy room 3, identified as Raymond [name] and the procedure verified as Flexible Video Fiberoptic Bronchoscopy.  A Time Out was held and the above information confirmed.   Prior to the date of the procedure a high-resolution CT scan of the chest was performed. Utilizing ION software program a virtual tracheobronchial tree was generated to allow the creation of distinct navigation pathways to the patient's parenchymal abnormalities. After being taken to the operating room general anesthesia was initiated and  the patient  was orally intubated. The video fiberoptic bronchoscope was introduced via the endotracheal tube and a general inspection was performed which showed normal right and left lung anatomy, aspiration of the bilateral mainstems was completed to remove any remaining secretions. Robotic catheter inserted into patient's endotracheal tube.   Target #1 left upper lobe mass: The distinct navigation pathways prepared prior to this procedure were then utilized to navigate to patient's lesion identified on CT scan. The robotic catheter was secured into place and the vision probe was withdrawn.  Lesion location was approximated using fluoroscopy and radial endobronchial ultrasound for peripheral targeting. Under fluoroscopic guidance transbronchial brushings, transbronchial needle biopsies, and transbronchial forceps biopsies were performed to be sent for cytology and pathology.  Under fluoroscopic guidance a single fiducial marker was placed adjacent to the left upper lobe mass to facilitate radiation treatment should this become indicated going forward.   The robotic scope was then withdrawn and the endobronchial ultrasound was used to identify and characterize the peritracheal, hilar and bronchial lymph nodes. Inspection showed a small 4L node, slightly enlarged nodes at station 4R and station 7. Using real-time ultrasound guidance Wang needle biopsies were take from Station 4R, 7 nodes and were sent for cytology.     At the end of the procedure a general airway inspection was performed and there was no evidence of active bleeding. The bronchoscope was removed.  The patient tolerated the procedure well. There was no significant blood loss and there were no obvious complications. A post-procedural chest x-ray is pending.   Samples Target #1: 1. Transbronchial needle brushings from left upper lobe mass 2. Transbronchial Wang needle biopsies from left upper lobe mass 3. Transbronchial forceps biopsies  from left upper lobe mass  EBUS Nodal Samples: 1. Wang needle biopsies from 4R node 2. Raymond [name]  needle biopsies from 7 node   Plans:  The patient will be discharged from the PACU to home when recovered from anesthesia and after chest x-ray is reviewed. We will review the cytology, pathology and microbiology results with the patient when they become available. Outpatient followup will be with Dr. Lamonte Sakai.    Baltazar Apo, MD, PhD 09/14/2020, 2:10 PM [facility] [phone] or if no answer before 7:00PM call [phone] For any issues after 7:00PM please call eLink [phone]

## 2020-09-14 NOTE — Anesthesia Preprocedure Evaluation (Signed)
Anesthesia Evaluation  Patient identified by MRN, date of birth, ID band Patient awake    Reviewed: Allergy & Precautions, NPO status , Patient's Chart, lab work & pertinent test results  Airway Mallampati: II  TM Distance: >3 FB     Dental  (+) Dental Advisory Given   Pulmonary sleep apnea , Current Smoker and Patient abstained from smoking.,    breath sounds clear to auscultation       Cardiovascular hypertension, Pt. on medications + Past MI   Rhythm:Regular Rate:Normal     Neuro/Psych    GI/Hepatic Neg liver ROS, hiatal hernia, GERD  ,  Endo/Other  negative endocrine ROS  Renal/GU negative Renal ROS     Musculoskeletal  (+) Arthritis ,   Abdominal   Peds  Hematology negative hematology ROS (+)   Anesthesia Other Findings   Reproductive/Obstetrics                             Anesthesia Physical Anesthesia Plan  ASA: 3  Anesthesia Plan: General   Post-op Pain Management:    Induction: Intravenous  PONV Risk Score and Plan: 1 and Dexamethasone, Ondansetron and Treatment may vary due to age or medical condition  Airway Management Planned: Oral ETT  Additional Equipment: None  Intra-op Plan:   Post-operative Plan: Extubation in OR  Informed Consent: I have reviewed the patients History and Physical, chart, labs and discussed the procedure including the risks, benefits and alternatives for the proposed anesthesia with the patient or authorized representative who has indicated his/her understanding and acceptance.     Dental advisory given  Plan Discussed with: CRNA  Anesthesia Plan Comments:         Anesthesia Quick Evaluation

## 2020-09-14 NOTE — Anesthesia Procedure Notes (Signed)
Procedure Name: Intubation Date/Time: 09/14/2020 12:43 PM Performed by: Betha Loa, CRNA Pre-anesthesia Checklist: Patient identified, Emergency Drugs available, Suction available and Patient being monitored Patient Re-evaluated:Patient Re-evaluated prior to induction Oxygen Delivery Method: Circle System Utilized Preoxygenation: Pre-oxygenation with 100% oxygen Induction Type: IV induction Ventilation: Mask ventilation without difficulty and Oral airway inserted - appropriate to patient size Laryngoscope Size: Mac and 4 Grade View: Grade I Tube type: Oral Tube size: 8.5 mm Number of attempts: 1 Airway Equipment and Method: Stylet and Oral airway Placement Confirmation: ETT inserted through vocal cords under direct vision, positive ETCO2 and breath sounds checked- equal and bilateral Secured at: 21 cm Tube secured with: Tape Dental Injury: Teeth and Oropharynx as per pre-operative assessment

## 2020-09-14 NOTE — Discharge Instructions (Signed)
Flexible Bronchoscopy, Care After This sheet gives you information about how to care for yourself after your test. Your doctor may also give you more specific instructions. If you have problems or questions, contact your doctor. Follow these instructions at home: Eating and drinking Do not eat or drink anything (not even water) for 2 hours after your test, or until your numbing medicine (local anesthetic) wears off. When your numbness is gone and your cough and gag reflexes have come back, you may: Eat only soft foods. Slowly drink liquids. The day after the test, go back to your normal diet. Driving Do not drive for 24 hours if you were given a medicine to help you relax (sedative). Do not drive or use heavy machinery while taking prescription pain medicine. General instructions  Take over-the-counter and prescription medicines only as told by your doctor. Return to your normal activities as told. Ask what activities are safe for you. Do not use any products that have nicotine or tobacco in them. This includes cigarettes and e-cigarettes. If you need help quitting, ask your doctor. Keep all follow-up visits as told by your doctor. This is important. It is very important if you had a tissue sample (biopsy) taken. Get help right away if: You have shortness of breath that gets worse. You get light-headed. You feel like you are going to pass out (faint). You have chest pain. You cough up: More than a little blood. More blood than before. Summary Do not eat or drink anything (not even water) for 2 hours after your test, or until your numbing medicine wears off. Do not use cigarettes. Do not use e-cigarettes. Get help right away if you have chest pain.  Please call our office if you have any questions or concerns.  [phone].  This information is not intended to replace advice given to you by your health care provider. Make sure you discuss any questions you have with your health care  provider. Document Released: 10/17/2008 Document Revised: 12/02/2016 Document Reviewed: 01/08/2016 Elsevier Patient Education  2020 [employer].

## 2020-09-15 ENCOUNTER — Encounter (HOSPITAL_COMMUNITY): Payer: Self-pay | Admitting: Emergency Medicine

## 2020-09-15 LAB — CYTOLOGY - NON PAP

## 2020-09-15 NOTE — Anesthesia Postprocedure Evaluation (Signed)
Anesthesia Post Note  Patient: Jacques Earthly  Procedure(s) Performed: VIDEO BRONCHOSCOPY WITH ENDOBRONCHIAL ULTRASOUND ROBOTIC VIDEO BRONCHOSCOPY WITH FLUORO BRONCHIAL NEEDLE ASPIRATION BIOPSIES BRONCHIAL BRUSHINGS BRONCHIAL BIOPSIES FIDUCIAL MARKER PLACEMENT VIDEO BRONCHOSCOPY WITH RADIAL ENDOBRONCHIAL ULTRASOUND     Patient location during evaluation: PACU Anesthesia Type: General Level of consciousness: awake and alert Pain management: pain level controlled Vital Signs Assessment: post-procedure vital signs reviewed and stable Respiratory status: spontaneous breathing, nonlabored ventilation, respiratory function stable and patient connected to nasal cannula oxygen Cardiovascular status: blood pressure returned to baseline and stable Postop Assessment: no apparent nausea or vomiting Anesthetic complications: no   No notable events documented.  Last Vitals:  Vitals:   09/14/20 1505 09/14/20 1515  BP:    Pulse:    Resp:    Temp:    SpO2: 94% 96%    Last Pain:  Vitals:   09/14/20 1449  TempSrc:   PainSc: 0-No pain                 Tiajuana Amass

## 2020-09-16 ENCOUNTER — Telehealth: Payer: Self-pay | Admitting: Emergency Medicine

## 2020-09-16 LAB — CYTOLOGY - NON PAP

## 2020-09-16 NOTE — Telephone Encounter (Signed)
Patient dropped off FMLA form to [place] office -emailed it to [place] for processing.

## 2020-09-16 NOTE — Telephone Encounter (Signed)
Patient stated he had a bronchoscopy 09/14/20 at [facility] by Dr. Lamonte Sakai.  Patient is requesting to bring FMLA paperwork for Dr. Lamonte Sakai to complete.   Message routed to Dr. Lamonte Sakai to advise on completing paperwork

## 2020-09-18 ENCOUNTER — Telehealth: Payer: Self-pay | Admitting: *Deleted

## 2020-09-18 ENCOUNTER — Telehealth: Payer: Self-pay | Admitting: Emergency Medicine

## 2020-09-18 DIAGNOSIS — C3490 Malignant neoplasm of unspecified part of unspecified bronchus or lung: Secondary | ICD-10-CM

## 2020-09-18 DIAGNOSIS — R918 Other nonspecific abnormal finding of lung field: Secondary | ICD-10-CM

## 2020-09-18 NOTE — Telephone Encounter (Signed)
I received referral on Raymond [name] today.  I called and scheduled him to be seen at thoracic clinic next week.  He verbalized understanding of appt.

## 2020-09-18 NOTE — Telephone Encounter (Signed)
Discussed pathology results with the patient and his wife by phone.  At this point we have a diagnosis of non-small cell lung cancer and left upper lobe mass, nodes appear to be negative.  Immunohistochemical stains are still pending to further characterize.  He wants to be referred locally and I will send him to [facility].  I will also order a PET scan and an MRI brain.

## 2020-09-23 ENCOUNTER — Encounter: Payer: Self-pay | Admitting: *Deleted

## 2020-09-23 DIAGNOSIS — Z0289 Encounter for other administrative examinations: Secondary | ICD-10-CM

## 2020-09-23 NOTE — Progress Notes (Signed)
Raymond [name] will be seeing Dr. Julien Nordmann at thoracic clinic tomorrow.  Per his pathology report, he has dx of NSCLC.  I checked and noted his molecular testing is pending.  I will contact pathology to see if there is enough tissue for PDL 1 testing per Dr. Julien Nordmann.

## 2020-09-23 NOTE — Telephone Encounter (Addendum)
FMLA form prepared for Dr. Lamonte Sakai. He is out of office until 9/27 so Dr. Valeta Harms has agreed to sign it.

## 2020-09-24 ENCOUNTER — Encounter: Payer: Self-pay | Admitting: Internal Medicine

## 2020-09-24 ENCOUNTER — Ambulatory Visit
Admission: RE | Admit: 2020-09-24 | Discharge: 2020-09-24 | Disposition: A | Discharge status: Home / Self Care | Payer: Self-pay | Source: Ambulatory Visit | Attending: Radiation Oncology | Admitting: Radiation Oncology

## 2020-09-24 ENCOUNTER — Inpatient Hospital Stay: Payer: Self-pay | Attending: Internal Medicine | Admitting: Internal Medicine

## 2020-09-24 ENCOUNTER — Encounter: Payer: Self-pay | Admitting: Radiation Oncology

## 2020-09-24 ENCOUNTER — Other Ambulatory Visit: Payer: Self-pay | Admitting: *Deleted

## 2020-09-24 ENCOUNTER — Inpatient Hospital Stay: Payer: Self-pay

## 2020-09-24 ENCOUNTER — Other Ambulatory Visit: Payer: Self-pay

## 2020-09-24 VITALS — BP 156/95 | HR 79 | Temp 97.6°F | Resp 18 | Wt 281.5 lb

## 2020-09-24 DIAGNOSIS — C3412 Malignant neoplasm of upper lobe, left bronchus or lung: Secondary | ICD-10-CM

## 2020-09-24 DIAGNOSIS — Z86711 Personal history of pulmonary embolism: Secondary | ICD-10-CM | POA: Insufficient documentation

## 2020-09-24 DIAGNOSIS — F17200 Nicotine dependence, unspecified, uncomplicated: Secondary | ICD-10-CM

## 2020-09-24 DIAGNOSIS — F17210 Nicotine dependence, cigarettes, uncomplicated: Secondary | ICD-10-CM | POA: Insufficient documentation

## 2020-09-24 DIAGNOSIS — Z7189 Other specified counseling: Secondary | ICD-10-CM | POA: Insufficient documentation

## 2020-09-24 DIAGNOSIS — R918 Other nonspecific abnormal finding of lung field: Secondary | ICD-10-CM

## 2020-09-24 DIAGNOSIS — Z5111 Encounter for antineoplastic chemotherapy: Secondary | ICD-10-CM

## 2020-09-24 HISTORY — DX: Malignant neoplasm of upper lobe, left bronchus or lung: C34.12

## 2020-09-24 LAB — CMP (CANCER CENTER ONLY)
ALT: 16 U/L (ref 0–44)
AST: 12 U/L — ABNORMAL LOW (ref 15–41)
Albumin: 3.6 g/dL (ref 3.5–5.0)
Alkaline Phosphatase: 106 U/L (ref 38–126)
Anion gap: 10 (ref 5–15)
BUN: 9 mg/dL (ref 6–20)
CO2: 24 mmol/L (ref 22–32)
Calcium: 9 mg/dL (ref 8.9–10.3)
Chloride: 110 mmol/L (ref 98–111)
Creatinine: 0.86 mg/dL (ref 0.61–1.24)
GFR, Estimated: >60 mL/min (ref >60)
Glucose, Bld: 93 mg/dL (ref 70–99)
Potassium: 3.8 mmol/L (ref 3.5–5.1)
Sodium: 144 mmol/L (ref 135–145)
Total Bilirubin: 0.4 mg/dL (ref 0.3–1.2)
Total Protein: 7 g/dL (ref 6.5–8.1)

## 2020-09-24 LAB — CBC WITH DIFFERENTIAL (CANCER CENTER ONLY)
Abs Immature Granulocytes: 0.04 10*3/uL (ref 0.00–0.07)
Basophils Absolute: 0 10*3/uL (ref 0.0–0.1)
Basophils Relative: 1 %
Eosinophils Absolute: 0.3 10*3/uL (ref 0.0–0.5)
Eosinophils Relative: 4 %
HCT: 41.4 % (ref 39.0–52.0)
Hemoglobin: 13.5 g/dL (ref 13.0–17.0)
Immature Granulocytes: 1 %
Lymphocytes Relative: 23 %
Lymphs Abs: 1.8 10*3/uL (ref 0.7–4.0)
MCH: 28.7 pg (ref 26.0–34.0)
MCHC: 32.6 g/dL (ref 30.0–36.0)
MCV: 88.1 fL (ref 80.0–100.0)
Monocytes Absolute: 0.7 10*3/uL (ref 0.1–1.0)
Monocytes Relative: 8 %
Neutro Abs: 5.2 10*3/uL (ref 1.7–7.7)
Neutrophils Relative %: 63 %
Platelet Count: 223 10*3/uL (ref 150–400)
RBC: 4.7 MIL/uL (ref 4.22–5.81)
RDW: 13.9 % (ref 11.5–15.5)
WBC Count: 8 10*3/uL (ref 4.0–10.5)
nRBC: 0 % (ref 0.0–0.2)

## 2020-09-24 NOTE — Progress Notes (Signed)
[place] [phone]   [phone] Multidisciplinary thoracic oncology clinic  CONSULT NOTE  REFERRING PHYSICIAN: Dr. Baltazar Apo  REASON FOR CONSULTATION:  60 years old African-American male recently diagnosed with lung cancer.  HPI Raymond [name] is a 60 y.o. male with past medical history significant for osteoarthritis, hypertension, myocardial infarction 2007, GERD, diverticulitis, sleep apnea as well as history of bilateral pneumonia as a child.  The patient also has a long history of smoking.  He presented to the emergency department at [facility] on September 02, 2020 complaining of shortness of breath as well as left lower quadrant pain.  CT of the abdomen pelvis performed the day before by his primary care provider showed sigmoid diverticulitis in the same distribution as seen on the previous scan in 2021.  The patient had CT angiogram of the chest on 8/31 2022 and it showed a lobulated masslike consolidation of the subpleural left upper lobe measuring 4.1 x 3.4 cm.  There was a more wedge-shaped heterogeneous opacity of the dependent left lower lobe suspicious to be distal to the embolus.  The scan also showed enlarged left hilar and AP window lymph nodes measuring up to 2.3 x 1.5 cm.  He was seen by Dr. Lamonte Sakai and on 09/14/2020 he underwent flexible video fiberoptic bronchoscopy, navigational bronchoscopy with robotic assistance and biopsies as well as endoscopic ultrasound procedure.  The final pathology ([id number]) showed malignant cells consistent with non-small cell carcinoma.  The carcinoma is positive with TTF-1 and negative for cytokeratin 5/6 and p63 consistent with adenocarcinoma.  There is likely sufficient tissue for additional testing. The patient had a PET scan and MRI of the brain scheduled for next week.  He was referred to me today for evaluation and recommendation regarding treatment of his condition. When seen today he is  feeling fine except for an anxiety and concern about his recent diagnosis.  He lost around 17 pounds during his hospitalization when he was treated for the diverticulitis and unable to eat. He has occasional headache.  He denied having any chest pain, shortness of breath, cough or hemoptysis.  He denied having any nausea, vomiting, diarrhea or constipation.  He denied having any visual changes. Family history significant for mother with lung cancer at age 6.  Sister had heart disease and father had unknown medical history. The patient is married and has no children.  He was accompanied today by his wife Raymond [name].  He works for [employer] as a Librarian, academic.  The patient has a history for smoking around 1 pack/day for 40 years unfortunately continues to smoke.  He quit alcohol more than 25 years ago and no history of drug abuse.  HPI  Past Medical History:  Diagnosis Date   Arthritis    Baker's cyst    right   Diverticulitis    GERD (gastroesophageal reflux disease)    Heart attack ([place]) 01/04/2004   History of hiatal hernia    Hypertension    Malignant neoplasm of upper lobe of left lung ([place]) 09/24/2020   Pneumonia    as a child   Sleep apnea    no longer on Cpap    Past Surgical History:  Procedure Laterality Date   BRONCHIAL BIOPSY  09/14/2020   Procedure: BRONCHIAL BIOPSIES;  Surgeon: Collene Gobble, MD;  Location: [place];  Service: Cardiopulmonary;;   BRONCHIAL BRUSHINGS  09/14/2020   Procedure: BRONCHIAL BRUSHINGS;  Surgeon: Collene Gobble, MD;  Location:  [facility];  Service: Cardiopulmonary;;   BRONCHIAL NEEDLE ASPIRATION BIOPSY  09/14/2020   Procedure: BRONCHIAL NEEDLE ASPIRATION BIOPSIES;  Surgeon: Collene Gobble, MD;  Location: [place];  Service: Cardiopulmonary;;   CARDIAC CATHETERIZATION     CARPAL TUNNEL RELEASE     right   FIDUCIAL MARKER PLACEMENT  09/14/2020   Procedure: FIDUCIAL MARKER PLACEMENT;  Surgeon: Collene Gobble, MD;  Location: [place];  Service: Cardiopulmonary;;   HAND SURGERY     left   KNEE SURGERY     left   VIDEO BRONCHOSCOPY N/A 09/14/2020   Procedure: ROBOTIC VIDEO BRONCHOSCOPY WITH FLUORO;  Surgeon: Collene Gobble, MD;  Location: [place];  Service: Cardiopulmonary;  Laterality: N/A;   VIDEO BRONCHOSCOPY WITH ENDOBRONCHIAL ULTRASOUND N/A 09/14/2020   Procedure: VIDEO BRONCHOSCOPY WITH ENDOBRONCHIAL ULTRASOUND;  Surgeon: Collene Gobble, MD;  Location: [place];  Service: Cardiopulmonary;  Laterality: N/A;   VIDEO BRONCHOSCOPY WITH RADIAL ENDOBRONCHIAL ULTRASOUND  09/14/2020   Procedure: VIDEO BRONCHOSCOPY WITH RADIAL ENDOBRONCHIAL ULTRASOUND;  Surgeon: Collene Gobble, MD;  Location: [facility];  Service: Cardiopulmonary;;    Family History  Problem Relation Age of Onset   Lung cancer Mother    Heart failure Sister     Social History Social History   Tobacco Use   Smoking status: Every Day    Types: Cigarettes   Smokeless tobacco: Never  Vaping Use   Vaping Use: Never used  Substance Use Topics   Alcohol use: No   Drug use: No    No Known Allergies  Current Outpatient Medications  Medication Sig Dispense Refill   acetaminophen (TYLENOL) 650 MG CR tablet Take 650 mg by mouth every 8 (eight) hours as needed for pain.     etodolac (LODINE) 500 MG tablet Take 500 mg by mouth 2 (two) times daily.     gabapentin (NEURONTIN) 600 MG tablet Take 600 mg by mouth at bedtime.     losartan-hydrochlorothiazide (HYZAAR) 50-12.5 MG tablet Take 1 tablet by mouth daily.     Misc Natural Products (OSTEO BI-FLEX ADV JOINT SHIELD) TABS Take 1 tablet by mouth daily.     MULTIPLE VITAMIN PO Take 1 tablet by mouth daily.     omeprazole (PRILOSEC) 10 MG capsule Take 10 mg by mouth as needed.     Probiotic Product (PROBIOTIC DAILY PO) Take 1 capsule by mouth daily.     Wheat Dextrin (BENEFIBER DRINK MIX) PACK Take by mouth.     oxyCODONE-acetaminophen (PERCOCET) 5-325 MG tablet Take 1-2 tablets by  mouth every 4 (four) hours as needed for severe pain. 15 tablet 0   No current facility-administered medications for this visit.    Review of Systems  Constitutional: positive for weight loss Eyes: negative Ears, nose, mouth, throat, and face: negative Respiratory: negative Cardiovascular: negative Gastrointestinal: negative Genitourinary:negative Integument/breast: negative Hematologic/lymphatic: negative Musculoskeletal:negative Neurological: positive for headaches Behavioral/Psych: positive for anxiety Endocrine: negative Allergic/Immunologic: negative  Physical Exam  ONG:EXBMW, healthy, no distress, well nourished, well developed, and anxious SKIN: skin color, texture, turgor are normal, no rashes or significant lesions HEAD: Normocephalic, No masses, lesions, tenderness or abnormalities EYES: normal, PERRLA, Conjunctiva are pink and non-injected EARS: External ears normal, Canals clear OROPHARYNX:no exudate, no erythema, and lips, buccal mucosa, and tongue normal  NECK: supple, no adenopathy, no JVD LYMPH:  no palpable lymphadenopathy, no hepatosplenomegaly LUNGS: clear to auscultation , and palpation HEART: regular rate & rhythm, no murmurs, and no gallops ABDOMEN:abdomen soft, non-tender, obese, normal bowel  sounds, and no masses or organomegaly BACK: Back symmetric, no curvature., No CVA tenderness EXTREMITIES:no joint deformities, effusion, or inflammation, no edema  NEURO: alert & oriented x 3 with fluent speech, no focal motor/sensory deficits  PERFORMANCE STATUS: ECOG 1  LABORATORY DATA: Lab Results  Component Value Date   WBC 8.0 09/24/2020   HGB 13.5 09/24/2020   HCT 41.4 09/24/2020   MCV 88.1 09/24/2020   PLT 223 09/24/2020      Chemistry      Component Value Date/Time   NA 144 09/24/2020 1350   K 3.8 09/24/2020 1350   CL 110 09/24/2020 1350   CO2 24 09/24/2020 1350   BUN 9 09/24/2020 1350   CREATININE 0.86 09/24/2020 1350      Component  Value Date/Time   CALCIUM 9.0 09/24/2020 1350   ALKPHOS 106 09/24/2020 1350   AST 12 (L) 09/24/2020 1350   ALT 16 09/24/2020 1350   BILITOT 0.4 09/24/2020 1350       RADIOGRAPHIC STUDIES: CT Angio Chest Pulmonary Embolism (PE) W or WO Contrast  Result Date: 09/04/2020 CLINICAL DATA:  Chest pain, high probability for pulmonary embolus, shortness of breath EXAM: CT ANGIOGRAPHY CHEST WITH CONTRAST TECHNIQUE: Multidetector CT imaging of the chest was performed using the standard protocol during bolus administration of intravenous contrast. Multiplanar CT image reconstructions and MIPs were obtained to evaluate the vascular anatomy. CONTRAST:  103m OMNIPAQUE IOHEXOL 350 MG/ML SOLN COMPARISON:  09/02/2020 FINDINGS: Cardiovascular: Intact thoracic aorta. Negative for aneurysm or dissection. No mediastinal hemorrhage or hematoma. Patent 2 vessel arch anatomy. Central and proximal hilar pulmonary arteries are patent and normal in caliber. No significant central or proximal hilar PE. Limited assessment of the more peripheral small segmental branches to exclude small PE. Central venous structures are patent.  No veno-occlusive process. Normal heart size.  No pericardial effusion. Mediastinum/Nodes: Atrophic left thyroid. Otherwise thyroid unremarkable. Trachea central airways are patent. Esophagus nondilated. Similar mild prominent AP window lymph node measures 3.1 cm in length but only 1 cm in short axis. Mildly enlarged left hilar lymph node measures 2.2 x 1.1 cm, unchanged. These are indeterminate for early nodal metastases as previously described. No contralateral or subcarinal adenopathy. No supraclavicular or axillary adenopathy. Lungs/Pleura: Apical small subpleural blebs noted. Peripheral left upper lobe masslike subpleural consolidation again measuring 4.1 x 3.4 cm. Adjacent atelectasis and or scarring noted. This is unchanged. Increased bibasilar atelectasis and new very small non loculated pleural  effusions. Upper Abdomen: Cholelithiasis noted. Vicarious contrast excretion noted within the gallbladder from prior CT. No acute upper abdominal finding. Musculoskeletal: Degenerative changes noted of the spine. No acute osseous finding. Sternum intact. Review of the MIP images confirms the above findings. IMPRESSION: Negative for significant acute central or proximal hilar pulmonary embolus. Limited assessment of the smaller peripheral segmental branches to exclude small PE. Persistent 4.1 cm left upper lobe lobulated masslike consolidation with adjacent AP window and left hilar mild adenopathy concerning for primary lung malignancy and early nodal disease. Increased bibasilar atelectasis and new developing trace pleural effusions. Cholelithiasis Electronically Signed   By: MJerilynn Mages  Shick M.D.   On: 09/04/2020 09:37   CT Angio Chest Pulmonary Embolism (PE) W or WO Contrast  Result Date: 09/02/2020 CLINICAL DATA:  Shortness of breath, rule out PE EXAM: CT ANGIOGRAPHY CHEST WITH CONTRAST TECHNIQUE: Multidetector CT imaging of the chest was performed using the standard protocol during bolus administration of intravenous contrast. Multiplanar CT image reconstructions and MIPs were obtained to evaluate the vascular anatomy.  CONTRAST:  88m OMNIPAQUE IOHEXOL 350 MG/ML SOLN COMPARISON:  Chest radiograph, 05/20/2019 FINDINGS: Cardiovascular: Examination for pulmonary embolism is substantially limited by marginal contrast bolus, main pulmonary artery = 180 HU, as well as breath motion artifact. Within this limitation, findings are suspicious for segmental to subsegmental embolus in the lower lobes (series 1, image 75). Mild cardiomegaly. No pericardial effusion. Mediastinum/Nodes: Enlarged left hilar and AP window lymph nodes, measuring up to 2.3 x 1.5 cm (series 1, image 44). Thyroid gland, trachea, and esophagus demonstrate no significant findings. Lungs/Pleura: There is a lobulated masslike consolidation of the  subpleural left upper lobe measuring 4.1 x 3.4 cm (series 4, image 37). There is a more wedge-shaped, heterogeneous opacity of the dependent left lower lobe; suspect that this is distal to embolus (series 4, image 9). Trace bilateral pleural effusions. Upper Abdomen: Please see forthcoming dedicated examination of abdomen and pelvis. Musculoskeletal: No chest wall abnormality. No acute or significant osseous findings. Review of the MIP images confirms the above findings. IMPRESSION: 1. Examination for pulmonary embolism is substantially limited by marginal contrast bolus and breath motion artifact. Within this limitation, findings are suspicious for segmental to subsegmental embolus in the lower lobes. Consider technical repeat examination to further evaluate. 2. There is a lobulated, masslike consolidation of the subpleural left upper lobe measuring 4.1 x 3.4 cm, highly concerning for primary lung malignancy. 3. Enlarged left hilar and AP window nodes, concerning for nodal metastatic disease. 4. There is a more wedge-shaped, heterogeneous opacity of the dependent left lower lobe; suspect that this is distal to embolus. This is concerning for pulmonary infarction. 5. Trace bilateral pleural effusions. These results will be called to the ordering clinician or representative by the Radiologist Assistant, and communication documented in the PACS or [employer] Electronically Signed   By: AEddie CandleM.D.   On: 09/02/2020 13:47   CT ABDOMEN PELVIS W CONTRAST  Result Date: 09/02/2020 CLINICAL DATA:  Diverticulitis, complication suspected EXAM: CT ABDOMEN AND PELVIS WITH CONTRAST TECHNIQUE: Multidetector CT imaging of the abdomen and pelvis was performed using the standard protocol following bolus administration of intravenous contrast. CONTRAST:  8103mOMNIPAQUE IOHEXOL 350 MG/ML SOLN COMPARISON:  CT abdomen pelvis, 09/01/2020 FINDINGS: Lower chest: Please see separately dictated examination of the chest.  Hepatobiliary: No solid liver abnormality is seen. Faintly calcified gallstones in the gallbladder. No gallbladder wall thickening, or biliary dilatation. Pancreas: Unremarkable. No pancreatic ductal dilatation or surrounding inflammatory changes. Spleen: Normal in size without significant abnormality. Adrenals/Urinary Tract: Adrenal glands are unremarkable. Kidneys are normal, without renal calculi, solid lesion, or hydronephrosis. Bladder is unremarkable. Stomach/Bowel: Stomach is within normal limits. Appendix appears normal. Sigmoid diverticulosis. No significant change in severe wall thickening fat stranding about the distal descending and proximal mid sigmoid colon (series 5, image 78). There remains a low-attenuation fluid collection at the left aspect of the proximal sigmoid measuring approximately 3.4 x 2.2 cm (series 5, image 78, series 8, image 80). Vascular/Lymphatic: Aortic atherosclerosis. No enlarged abdominal or pelvic lymph nodes. Reproductive: No mass or other significant abnormality. Other: No abdominal wall hernia or abnormality. No abdominopelvic ascites. Musculoskeletal: No acute or significant osseous findings. IMPRESSION: 1. No significant change in severe wall thickening fat and stranding about the distal descending and proximal mid sigmoid colon, consistent with acute diverticulitis. 2. There remains a low-attenuation fluid collection at the left aspect of the proximal sigmoid measuring approximately 3.4 x 2.2 cm, consistent with phlegmon or abscess. 3. Cholelithiasis. Aortic Atherosclerosis (ICD10-I70.0). Electronically Signed  By: Eddie Candle M.D.   On: 09/02/2020 13:52   CT Abdomen Pelvis W Contrast  Result Date: 09/01/2020 CLINICAL DATA:  Left lower quadrant pain for 3 days EXAM: CT ABDOMEN AND PELVIS WITH CONTRAST TECHNIQUE: Multidetector CT imaging of the abdomen and pelvis was performed using the standard protocol following bolus administration of intravenous contrast.  CONTRAST:  32m OMNIPAQUE IOHEXOL 350 MG/ML SOLN COMPARISON:  05/21/2019 FINDINGS: Lower chest: Opacity with volume loss in the lower lobes consistent with atelectasis Hepatobiliary: No focal liver abnormality.Cholelithiasis. No evidence of acute cholecystitis. Pancreas: Unremarkable. Spleen: Unremarkable. Adrenals/Urinary Tract: Negative adrenals. No hydronephrosis or ureteral stone. Punctate left renal calculus. Unremarkable bladder. Stomach/Bowel: Segment of bowel wall thickening and mesenteric stranding at sigmoid colon. The same segment was affected previously when there was better demonstrated numerous colonic diverticula. A low-density component is seen along the anti mesenteric wall, most likely phlegmon. Diffuse colonic stool. Negative for bowel obstruction. No appendicitis Vascular/Lymphatic: Diffuse atheromatous calcification of the aorta and iliacs. No mass or adenopathy. Reproductive:No acute finding. Other: No ascites or pneumoperitoneum. Musculoskeletal: No acute abnormalities. Advanced lumbar spine degeneration with mild scoliosis. Spinal stenosis and foraminal impingement at L3-4 and L4-5 due to extensive endplate ridging. IMPRESSION: 1. Sigmoid diverticulitis in the same distribution as seen by CT in 2021. Phlegmon is noted along the anti mesenteric wall without drainable fluid collection or pneumoperitoneum. 2. Cholelithiasis. 3. Atelectasis at the bases. Electronically Signed   By: JMonte FantasiaM.D.   On: 09/01/2020 04:48   DG Chest Port 1 View  Result Date: 09/14/2020 CLINICAL DATA:  History of left upper lobe mass status post bronchoscopy EXAM: PORTABLE CHEST 1 VIEW COMPARISON:  09/04/2020 FINDINGS: Cardiac shadow is at the upper limits of normal in size but accentuated by the portable technique. Left upper lobe mass lesion is again noted with fiducial marker in place. No pneumothorax is noted. No focal infiltrate or effusion is seen. No bony abnormality is noted. IMPRESSION: Status post  bronchoscopy without evidence of pneumothorax. Electronically Signed   By: MInez CatalinaM.D.   On: 09/14/2020 15:20   ECHOCARDIOGRAM COMPLETE  Result Date: 09/04/2020    ECHOCARDIOGRAM REPORT   Patient Name:   RAMR STURTEVANTDate of Exam: 09/04/2020 Medical Rec #:  [id number]      Height:       70.0 in Accession #:    [id number]     Weight:       297.8 lb Date of Birth:  [birth year]      BSA:          2.472 m Patient Age:    89years        BP:           160/76 mmHg Patient Gender: M               HR:           81 bpm. Exam Location:  Inpatient Procedure: 2D Echo Indications:    pulmonary embolus  History:        Patient has prior history of Echocardiogram examinations, most                 recent 05/21/2019. Risk Factors:Hypertension.  Sonographer:    LJohny ChessRDCS Referring Phys: [id number] [place] 1. Left ventricular ejection fraction, by estimation, is 65 to 70%. The left ventricle has normal function. The left ventricle has no regional wall motion abnormalities. There is moderate concentric left  ventricular hypertrophy. Left ventricular diastolic parameters were normal.  2. Right ventricular systolic function is normal. The right ventricular size is normal. Tricuspid regurgitation signal is inadequate for assessing PA pressure.  3. The mitral valve is normal in structure. No evidence of mitral valve regurgitation. No evidence of mitral stenosis.  4. The aortic valve was not well visualized. Aortic valve regurgitation is not visualized. No aortic stenosis is present.  5. The inferior vena cava is normal in size with greater than 50% respiratory variability, suggesting right atrial pressure of 3 mmHg. Comparison(s): A prior study was performed on 05/21/2019. LV is slightly more vigorous, aortic sizing in this study is within normal limits for age and BSA. FINDINGS  Left Ventricle: Left ventricular ejection fraction, by estimation, is 65 to 70%. The left ventricle has normal function. The  left ventricle has no regional wall motion abnormalities. The left ventricular internal cavity size was normal in size. There is  moderate concentric left ventricular hypertrophy. Left ventricular diastolic parameters were normal. Right Ventricle: The right ventricular size is normal. No increase in right ventricular wall thickness. Right ventricular systolic function is normal. Tricuspid regurgitation signal is inadequate for assessing PA pressure. Left Atrium: Left atrial size was normal in size. Right Atrium: Right atrial size was normal in size. Pericardium: There is no evidence of pericardial effusion. Mitral Valve: The mitral valve is normal in structure. No evidence of mitral valve regurgitation. No evidence of mitral valve stenosis. Tricuspid Valve: The tricuspid valve is normal in structure. Tricuspid valve regurgitation is not demonstrated. No evidence of tricuspid stenosis. Aortic Valve: The aortic valve was not well visualized. Aortic valve regurgitation is not visualized. No aortic stenosis is present. Pulmonic Valve: Discordance between color and spectral Doppler. The pulmonic valve was grossly normal. Pulmonic valve regurgitation is trivial. No evidence of pulmonic stenosis. Aorta: The aortic root and ascending aorta are structurally normal, with no evidence of dilitation. Venous: The inferior vena cava is normal in size with greater than 50% respiratory variability, suggesting right atrial pressure of 3 mmHg. IAS/Shunts: The atrial septum is grossly normal.  LEFT VENTRICLE PLAX 2D LVIDd:         5.40 cm  Diastology LVIDs:         3.30 cm  LV e' medial:    9.25 cm/s LV PW:         1.40 cm  LV E/e' medial:  9.6 LV IVS:        1.30 cm  LV e' lateral:   12.20 cm/s LVOT diam:     2.50 cm  LV E/e' lateral: 7.3 LV SV:         129 LV SV Index:   52 LVOT Area:     4.91 cm  IVC IVC diam: 1.90 cm LEFT ATRIUM             Index       RIGHT ATRIUM           Index LA diam:        4.40 cm 1.78 cm/m  RA Area:      22.30 cm LA Vol (A2C):   83.2 ml 33.65 ml/m RA Volume:   63.50 ml  25.68 ml/m LA Vol (A4C):   73.3 ml 29.65 ml/m LA Biplane Vol: 82.6 ml 33.41 ml/m  AORTIC VALVE LVOT Vmax:   137.00 cm/s LVOT Vmean:  95.100 cm/s LVOT VTI:    0.262 m  AORTA Ao Root diam: 3.70 cm Ao Asc diam:  3.50 cm MITRAL VALVE MV Area (PHT): 3.60 cm    SHUNTS MV Decel Time: 211 msec    Systemic VTI:  0.26 m MV E velocity: 88.80 cm/s  Systemic Diam: 2.50 cm MV A velocity: 70.40 cm/s MV E/A ratio:  1.26 Rudean Haskell MD Electronically signed by Rudean Haskell MD Signature Date/Time: 09/04/2020/4:11:13 PM    Final    VAS [place] LOWER EXTREMITY VENOUS (DVT)  Result Date: 09/04/2020  Lower Venous DVT Study Patient Name:  KARMELO BASS  Date of Exam:   09/04/2020 Medical Rec #: [id number]        Accession #:    [id number] Date of Birth: 11-13-1960        Patient Gender: M Patient Age:   56 years Exam Location:  [facility] Procedure:      VAS [place] LOWER EXTREMITY VENOUS (DVT) Referring Phys: RAVI PAHWANI --------------------------------------------------------------------------------  Indications: Pulmonary embolism.  Comparison Study: 02/28/15 prior Performing Technologist: Archie Patten RVS  Examination Guidelines: A complete evaluation includes B-mode imaging, spectral Doppler, color Doppler, and power Doppler as needed of all accessible portions of each vessel. Bilateral testing is considered an integral part of a complete examination. Limited examinations for reoccurring indications may be performed as noted. The reflux portion of the exam is performed with the patient in reverse Trendelenburg.  +---------+---------------+---------+-----------+----------+--------------+ RIGHT    CompressibilityPhasicitySpontaneityPropertiesThrombus Aging +---------+---------------+---------+-----------+----------+--------------+ CFV      Full           Yes      Yes                                  +---------+---------------+---------+-----------+----------+--------------+ SFJ      Full                                                        +---------+---------------+---------+-----------+----------+--------------+ FV Prox  Full                                                        +---------+---------------+---------+-----------+----------+--------------+ FV Mid   Full                                                        +---------+---------------+---------+-----------+----------+--------------+ FV DistalFull                                                        +---------+---------------+---------+-----------+----------+--------------+ PFV      Full                                                        +---------+---------------+---------+-----------+----------+--------------+  POP      Full           Yes      Yes                                 +---------+---------------+---------+-----------+----------+--------------+ PTV      Full                                                        +---------+---------------+---------+-----------+----------+--------------+ PERO     Full                                                        +---------+---------------+---------+-----------+----------+--------------+   +---------+---------------+---------+-----------+----------+-------------------+ LEFT     CompressibilityPhasicitySpontaneityPropertiesThrombus Aging      +---------+---------------+---------+-----------+----------+-------------------+ CFV      Full           Yes      Yes                                      +---------+---------------+---------+-----------+----------+-------------------+ SFJ      Full                                                             +---------+---------------+---------+-----------+----------+-------------------+ FV Prox  Full                                                              +---------+---------------+---------+-----------+----------+-------------------+ FV Mid   Full                                                             +---------+---------------+---------+-----------+----------+-------------------+ FV DistalFull                                                             +---------+---------------+---------+-----------+----------+-------------------+ PFV      Full                                                             +---------+---------------+---------+-----------+----------+-------------------+ POP      Full  Yes      Yes                                      +---------+---------------+---------+-----------+----------+-------------------+ PTV      Full                                                             +---------+---------------+---------+-----------+----------+-------------------+ PERO                                                  Not well visualized +---------+---------------+---------+-----------+----------+-------------------+     Summary: RIGHT: - There is no evidence of deep vein thrombosis in the lower extremity.  - A cystic structure is found in the popliteal fossa.  LEFT: - There is no evidence of deep vein thrombosis in the lower extremity.  - No cystic structure found in the popliteal fossa.  *See table(s) above for measurements and observations. Electronically signed by Deitra Mayo MD on 09/04/2020 at 4:20:21 PM.    Final    DG C-ARM BRONCHOSCOPY  Result Date: 09/14/2020 C-ARM BRONCHOSCOPY: Fluoroscopy was utilized by the requesting physician.  No radiographic interpretation.    ASSESSMENT: This is a very pleasant 60 years old African-American male recently diagnosed with stage IIIa (T2b, N2, MX) non-small cell lung cancer, adenocarcinoma presented with left upper lobe lung mass in addition to left hilar and mediastinal lymphadenopathy diagnosed in September 2022 pending further staging  work-up.   PLAN: I had a lengthy discussion with the patient and his wife today about his current disease stage, prognosis and treatment options. I personally and independently reviewed the scan images and discussed the result and showed the images to the patient and his wife. If the patient has no evidence of metastatic disease outside the currently known lesions on the CT scan of the chest, she would benefit from a course of concurrent chemoradiation with weekly carboplatin for AUC of 2 and paclitaxel 45 Mg/M2 for 6-7 weeks followed by consolidation immunotherapy if the patient has no evidence for disease progression after the induction phase. I discussed with the patient the adverse effect of this treatment including but not limited to alopecia, myelosuppression, nausea and vomiting, peripheral neuropathy, liver or renal dysfunction. I will also send the tissue block as well as blood sample for molecular studies and PD-L1 expression. The patient is expected to start the first cycle of her treatment on October 05, 2020. He saw Dr. Lisbeth Renshaw earlier today for evaluation and discussion of the radiotherapy option. The patient will have a chemotherapy education class before the first dose of treatment. I will send prescription for Compazine 10 mg p.o. every 6 hours as needed for nausea to his pharmacy. The patient will come back for follow-up visit 1 week after the start of his treatment for evaluation and management of any adverse effect of his treatment. She was advised to call immediately if he has any other concerning symptoms in the interval. For the smoking cessation I strongly encouraged the patient to quit smoking. He was advised to call immediately if he has any other  concerning symptoms in the interval. The patient voices understanding of current disease status and treatment options and is in agreement with the current care plan.  All questions were answered. The patient knows to call the clinic  with any problems, questions or concerns. We can certainly see the patient much sooner if necessary.  Thank you so much for allowing me to participate in the care of TAYDON NASWORTHY. I will continue to follow up the patient with you and assist in his care.  The total time spent in the appointment was 90 minutes.  Disclaimer: This note was dictated with voice recognition software. Similar sounding words can inadvertently be transcribed and may not be corrected upon review.   Eilleen Kempf September 24, 2020, 2:54 PM

## 2020-09-24 NOTE — Progress Notes (Signed)
START ON PATHWAY REGIMEN - Non-Small Cell Lung     Administer weekly:     Paclitaxel      Carboplatin   **Always confirm dose/schedule in your pharmacy ordering system**  Patient Characteristics: Preoperative or Nonsurgical Candidate (Clinical Staging), Stage III - Nonsurgical Candidate (Nonsquamous and Squamous), PS = 0, 1 Therapeutic Status: Preoperative or Nonsurgical Candidate (Clinical Staging) AJCC T Category: cT2b AJCC N Category: cN2 AJCC M Category: cM0 AJCC 8 Stage Grouping: IIIA ECOG Performance Status: 1 Intent of Therapy: Non-Curative / Palliative Intent, Discussed with Patient

## 2020-09-24 NOTE — Progress Notes (Signed)
Radiation Oncology         [phone] ________________________________  Name: Raymond [name]        MRN: [id number]  Date of Service: 09/24/2020 DOB: December 09, 1960  BT:[id number], Iona Beard, MD  Collene Gobble, MD     REFERRING PHYSICIAN: Collene Gobble, MD   DIAGNOSIS: The encounter diagnosis was Malignant neoplasm of upper lobe of left lung ([place]).   HISTORY OF PRESENT ILLNESS: Raymond [name] is a 60 y.o. male seen at the request of Dr. Lamonte Sakai for a new diagnosis of NSCLC, of the LUL.  The patient presented to the emergency department with symptoms of abdominal pain. CT of the abdomen and pelvis on 09/01/2020 showed sigmoid diverticulitis and phlegmon along the mesenteric wall.  No fluid collection was seen no pneumoperitoneum was identified.  He also had symptoms of shortness of breath and on 09/02/2020 a CT angiogram to rule out pulmonary embolism was performed there was concern for possible filling defect but this was limited due to marginal contrast bolus.  There was an incidental mass in the left upper lobe measuring 4.1 cm in greatest dimension as well as enlargement of the left hilar and AP window lymph nodes.  Images of the abdomen and pelvis with contrast showed persistent infectious changes in the left proximal sigmoid measuring up to 3.4 cm consistent with phlegmon no evidence of metastatic disease was noted.  Repeat CT angio was performed and ultimately was felt to be negative for acute central or proximal hilar embolism but limited again for small peripheral segmental branches.  He underwent bronchoscopy on 09/14/2020, and final cytology from brushings and fine-needle aspirate showed non-small cell consistent with adenocarcinoma.  He is scheduled to undergo PET and MRI brain on 10/02/2020 and is seen today to discuss treatment of his cancer.    PREVIOUS RADIATION THERAPY: No   PAST MEDICAL HISTORY:  Past Medical History:  Diagnosis Date   Arthritis    Baker's cyst    right    Diverticulitis    GERD (gastroesophageal reflux disease)    Heart attack ([place]) 01/04/2004   History of hiatal hernia    Hypertension    Malignant neoplasm of upper lobe of left lung ([place]) 09/24/2020   Pneumonia    as a child   Sleep apnea    no longer on Cpap       PAST SURGICAL HISTORY: Past Surgical History:  Procedure Laterality Date   BRONCHIAL BIOPSY  09/14/2020   Procedure: BRONCHIAL BIOPSIES;  Surgeon: Collene Gobble, MD;  Location: [facility];  Service: Cardiopulmonary;;   BRONCHIAL BRUSHINGS  09/14/2020   Procedure: BRONCHIAL BRUSHINGS;  Surgeon: Collene Gobble, MD;  Location: [facility];  Service: Cardiopulmonary;;   BRONCHIAL NEEDLE ASPIRATION BIOPSY  09/14/2020   Procedure: BRONCHIAL NEEDLE ASPIRATION BIOPSIES;  Surgeon: Collene Gobble, MD;  Location: [facility];  Service: Cardiopulmonary;;   CARDIAC CATHETERIZATION     CARPAL TUNNEL RELEASE     right   FIDUCIAL MARKER PLACEMENT  09/14/2020   Procedure: FIDUCIAL MARKER PLACEMENT;  Surgeon: Collene Gobble, MD;  Location: [facility]OSCOPY;  Service: Cardiopulmonary;;   HAND SURGERY     left   KNEE SURGERY     left   VIDEO BRONCHOSCOPY N/A 09/14/2020   Procedure: ROBOTIC VIDEO BRONCHOSCOPY WITH FLUORO;  Surgeon: Collene Gobble, MD;  Location: [facility];  Service: Cardiopulmonary;  Laterality: N/A;   VIDEO BRONCHOSCOPY WITH ENDOBRONCHIAL ULTRASOUND N/A 09/14/2020   Procedure: VIDEO BRONCHOSCOPY WITH ENDOBRONCHIAL  ULTRASOUND;  Surgeon: Collene Gobble, MD;  Location: [facility];  Service: Cardiopulmonary;  Laterality: N/A;   VIDEO BRONCHOSCOPY WITH RADIAL ENDOBRONCHIAL ULTRASOUND  09/14/2020   Procedure: VIDEO BRONCHOSCOPY WITH RADIAL ENDOBRONCHIAL ULTRASOUND;  Surgeon: Collene Gobble, MD;  Location: [facility];  Service: Cardiopulmonary;;     FAMILY HISTORY:  Family History  Problem Relation Age of Onset   Lung cancer Mother    Heart failure Sister      SOCIAL HISTORY:  reports that he has been smoking  cigarettes. He has never used smokeless tobacco. He reports that he does not drink alcohol and does not use drugs.  The patient is married and lives in [place].  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  He has worked there many years.  He works Monday through Saturday 12 to 13-hour shifts.  He enjoys his Sundays, at church and watching sports on TV.  ALLERGIES: Patient has no known allergies.   MEDICATIONS:  Current Outpatient Medications  Medication Sig Dispense Refill   acetaminophen (TYLENOL) 650 MG CR tablet Take 650 mg by mouth every 8 (eight) hours as needed for pain.     etodolac (LODINE) 500 MG tablet Take 500 mg by mouth 2 (two) times daily.     gabapentin (NEURONTIN) 600 MG tablet Take 600 mg by mouth at bedtime.     losartan-hydrochlorothiazide (HYZAAR) 50-12.5 MG tablet Take 1 tablet by mouth daily.     Misc Natural Products (OSTEO BI-FLEX ADV JOINT SHIELD) TABS Take 1 tablet by mouth daily.     MULTIPLE VITAMIN PO Take 1 tablet by mouth daily.     omeprazole (PRILOSEC) 10 MG capsule Take 10 mg by mouth as needed.     oxyCODONE-acetaminophen (PERCOCET) 5-325 MG tablet Take 1-2 tablets by mouth every 4 (four) hours as needed for severe pain. 15 tablet 0   No current facility-administered medications for this encounter.     REVIEW OF SYSTEMS: On review of systems, the patient reports that he is doing okay but coping with his diagnosis and the sadness of the loss of control in the midst of cancer is scary for him.  His mother passed away from cancer and did not share her diagnosis until very late in her course.  He is worried about work and being able to support his family.  He reports that he has had episodes of shortness of breath, this does not keep him from activity however.  He states that rest usually allows this to subside.  He reports occasional cough but denies hemoptysis.  He has experienced pain shooting from his pelvis down into his posterior thighs.  He  reports that currently he has lost about 25 pounds or so, and feels like he was significantly debilitated after his diverticulitis.  Certain foods bother his digestion and he has been trying to make sure he is taking in adequate nutrition but not create more symptoms of pain from his abdomen.  No other complaints are verbalized.  PHYSICAL EXAM:  Wt Readings from Last 3 Encounters:  09/14/20 270 lb (122.5 kg)  09/01/20 297 lb 13.5 oz (135.1 kg)  05/23/19 282 lb 3 oz (128 kg)   Temp Readings from Last 3 Encounters:  09/14/20 97.9 F (36.6 C) (Temporal)  09/06/20 98.3 F (36.8 C) (Oral)  05/25/19 98.6 F (37 C)   BP Readings from Last 3 Encounters:  09/14/20 (!) 146/78  09/06/20 (!) 150/76  05/25/19 (!) 157/93   Pulse Readings  from Last 3 Encounters:  09/14/20 75  09/06/20 77  05/25/19 (!) 59    In general this is a well appearing African-American male in no acute distress.  He's alert and oriented x4 and appropriate throughout the examination. Cardiopulmonary assessment is negative for acute distress and he exhibits normal effort.     ECOG = 1  0 - Asymptomatic (Fully active, able to carry on all predisease activities without restriction)  1 - Symptomatic but completely ambulatory (Restricted in physically strenuous activity but ambulatory and able to carry out work of a light or sedentary nature. For example, light housework, office work)  2 - Symptomatic, <50% in bed during the day (Ambulatory and capable of all self care but unable to carry out any work activities. Up and about more than 50% of waking hours)  3 - Symptomatic, >50% in bed, but not bedbound (Capable of only limited self-care, confined to bed or chair 50% or more of waking hours)  4 - Bedbound (Completely disabled. Cannot carry on any self-care. Totally confined to bed or chair)  5 - Death   Eustace Pen MM, Creech RH, Tormey DC, et al. [phone]). "Toxicity and response criteria of the [facility]  Group". [place] Oncol. 5 (6): 649-55    LABORATORY DATA:  Lab Results  Component Value Date   WBC 11.1 (H) 09/06/2020   HGB 12.3 (L) 09/06/2020   HCT 37.6 (L) 09/06/2020   MCV 87.6 09/06/2020   PLT 270 09/06/2020   Lab Results  Component Value Date   NA 139 09/06/2020   K 3.1 (L) 09/06/2020   CL 108 09/06/2020   CO2 23 09/06/2020   Lab Results  Component Value Date   ALT 20 08/31/2020   AST 20 08/31/2020   ALKPHOS 73 08/31/2020   BILITOT 1.2 08/31/2020      RADIOGRAPHY: CT Angio Chest Pulmonary Embolism (PE) W or WO Contrast  Result Date: 09/04/2020 CLINICAL DATA:  Chest pain, high probability for pulmonary embolus, shortness of breath EXAM: CT ANGIOGRAPHY CHEST WITH CONTRAST TECHNIQUE: Multidetector CT imaging of the chest was performed using the standard protocol during bolus administration of intravenous contrast. Multiplanar CT image reconstructions and MIPs were obtained to evaluate the vascular anatomy. CONTRAST:  50mL OMNIPAQUE IOHEXOL 350 MG/ML SOLN COMPARISON:  09/02/2020 FINDINGS: Cardiovascular: Intact thoracic aorta. Negative for aneurysm or dissection. No mediastinal hemorrhage or hematoma. Patent 2 vessel arch anatomy. Central and proximal hilar pulmonary arteries are patent and normal in caliber. No significant central or proximal hilar PE. Limited assessment of the more peripheral small segmental branches to exclude small PE. Central venous structures are patent.  No veno-occlusive process. Normal heart size.  No pericardial effusion. Mediastinum/Nodes: Atrophic left thyroid. Otherwise thyroid unremarkable. Trachea central airways are patent. Esophagus nondilated. Similar mild prominent AP window lymph node measures 3.1 cm in length but only 1 cm in short axis. Mildly enlarged left hilar lymph node measures 2.2 x 1.1 cm, unchanged. These are indeterminate for early nodal metastases as previously described. No contralateral or subcarinal adenopathy. No  supraclavicular or axillary adenopathy. Lungs/Pleura: Apical small subpleural blebs noted. Peripheral left upper lobe masslike subpleural consolidation again measuring 4.1 x 3.4 cm. Adjacent atelectasis and or scarring noted. This is unchanged. Increased bibasilar atelectasis and new very small non loculated pleural effusions. Upper Abdomen: Cholelithiasis noted. Vicarious contrast excretion noted within the gallbladder from prior CT. No acute upper abdominal finding. Musculoskeletal: Degenerative changes noted of the spine. No acute osseous finding. Sternum  intact. Review of the MIP images confirms the above findings. IMPRESSION: Negative for significant acute central or proximal hilar pulmonary embolus. Limited assessment of the smaller peripheral segmental branches to exclude small PE. Persistent 4.1 cm left upper lobe lobulated masslike consolidation with adjacent AP window and left hilar mild adenopathy concerning for primary lung malignancy and early nodal disease. Increased bibasilar atelectasis and new developing trace pleural effusions. Cholelithiasis Electronically Signed   By: Jerilynn Mages.  Shick M.D.   On: 09/04/2020 09:37   CT Angio Chest Pulmonary Embolism (PE) W or WO Contrast  Result Date: 09/02/2020 CLINICAL DATA:  Shortness of breath, rule out PE EXAM: CT ANGIOGRAPHY CHEST WITH CONTRAST TECHNIQUE: Multidetector CT imaging of the chest was performed using the standard protocol during bolus administration of intravenous contrast. Multiplanar CT image reconstructions and MIPs were obtained to evaluate the vascular anatomy. CONTRAST:  70mL OMNIPAQUE IOHEXOL 350 MG/ML SOLN COMPARISON:  Chest radiograph, 05/20/2019 FINDINGS: Cardiovascular: Examination for pulmonary embolism is substantially limited by marginal contrast bolus, main pulmonary artery = 180 HU, as well as breath motion artifact. Within this limitation, findings are suspicious for segmental to subsegmental embolus in the lower lobes (series 1,  image 75). Mild cardiomegaly. No pericardial effusion. Mediastinum/Nodes: Enlarged left hilar and AP window lymph nodes, measuring up to 2.3 x 1.5 cm (series 1, image 44). Thyroid gland, trachea, and esophagus demonstrate no significant findings. Lungs/Pleura: There is a lobulated masslike consolidation of the subpleural left upper lobe measuring 4.1 x 3.4 cm (series 4, image 37). There is a more wedge-shaped, heterogeneous opacity of the dependent left lower lobe; suspect that this is distal to embolus (series 4, image 9). Trace bilateral pleural effusions. Upper Abdomen: Please see forthcoming dedicated examination of abdomen and pelvis. Musculoskeletal: No chest wall abnormality. No acute or significant osseous findings. Review of the MIP images confirms the above findings. IMPRESSION: 1. Examination for pulmonary embolism is substantially limited by marginal contrast bolus and breath motion artifact. Within this limitation, findings are suspicious for segmental to subsegmental embolus in the lower lobes. Consider technical repeat examination to further evaluate. 2. There is a lobulated, masslike consolidation of the subpleural left upper lobe measuring 4.1 x 3.4 cm, highly concerning for primary lung malignancy. 3. Enlarged left hilar and AP window nodes, concerning for nodal metastatic disease. 4. There is a more wedge-shaped, heterogeneous opacity of the dependent left lower lobe; suspect that this is distal to embolus. This is concerning for pulmonary infarction. 5. Trace bilateral pleural effusions. These results will be called to the ordering clinician or representative by the Radiologist Assistant, and communication documented in the PACS or [employer]. Electronically Signed   By: Eddie Candle M.D.   On: 09/02/2020 13:47   CT ABDOMEN PELVIS W CONTRAST  Result Date: 09/02/2020 CLINICAL DATA:  Diverticulitis, complication suspected EXAM: CT ABDOMEN AND PELVIS WITH CONTRAST TECHNIQUE: Multidetector  CT imaging of the abdomen and pelvis was performed using the standard protocol following bolus administration of intravenous contrast. CONTRAST:  28mL OMNIPAQUE IOHEXOL 350 MG/ML SOLN COMPARISON:  CT abdomen pelvis, 09/01/2020 FINDINGS: Lower chest: Please see separately dictated examination of the chest. Hepatobiliary: No solid liver abnormality is seen. Faintly calcified gallstones in the gallbladder. No gallbladder wall thickening, or biliary dilatation. Pancreas: Unremarkable. No pancreatic ductal dilatation or surrounding inflammatory changes. Spleen: Normal in size without significant abnormality. Adrenals/Urinary Tract: Adrenal glands are unremarkable. Kidneys are normal, without renal calculi, solid lesion, or hydronephrosis. Bladder is unremarkable. Stomach/Bowel: Stomach is within normal limits. Appendix  appears normal. Sigmoid diverticulosis. No significant change in severe wall thickening fat stranding about the distal descending and proximal mid sigmoid colon (series 5, image 78). There remains a low-attenuation fluid collection at the left aspect of the proximal sigmoid measuring approximately 3.4 x 2.2 cm (series 5, image 78, series 8, image 80). Vascular/Lymphatic: Aortic atherosclerosis. No enlarged abdominal or pelvic lymph nodes. Reproductive: No mass or other significant abnormality. Other: No abdominal wall hernia or abnormality. No abdominopelvic ascites. Musculoskeletal: No acute or significant osseous findings. IMPRESSION: 1. No significant change in severe wall thickening fat and stranding about the distal descending and proximal mid sigmoid colon, consistent with acute diverticulitis. 2. There remains a low-attenuation fluid collection at the left aspect of the proximal sigmoid measuring approximately 3.4 x 2.2 cm, consistent with phlegmon or abscess. 3. Cholelithiasis. Aortic Atherosclerosis (ICD10-I70.0). Electronically Signed   By: Eddie Candle M.D.   On: 09/02/2020 13:52   CT Abdomen  Pelvis W Contrast  Result Date: 09/01/2020 CLINICAL DATA:  Left lower quadrant pain for 3 days EXAM: CT ABDOMEN AND PELVIS WITH CONTRAST TECHNIQUE: Multidetector CT imaging of the abdomen and pelvis was performed using the standard protocol following bolus administration of intravenous contrast. CONTRAST:  69mL OMNIPAQUE IOHEXOL 350 MG/ML SOLN COMPARISON:  05/21/2019 FINDINGS: Lower chest: Opacity with volume loss in the lower lobes consistent with atelectasis Hepatobiliary: No focal liver abnormality.Cholelithiasis. No evidence of acute cholecystitis. Pancreas: Unremarkable. Spleen: Unremarkable. Adrenals/Urinary Tract: Negative adrenals. No hydronephrosis or ureteral stone. Punctate left renal calculus. Unremarkable bladder. Stomach/Bowel: Segment of bowel wall thickening and mesenteric stranding at sigmoid colon. The same segment was affected previously when there was better demonstrated numerous colonic diverticula. A low-density component is seen along the anti mesenteric wall, most likely phlegmon. Diffuse colonic stool. Negative for bowel obstruction. No appendicitis Vascular/Lymphatic: Diffuse atheromatous calcification of the aorta and iliacs. No mass or adenopathy. Reproductive:No acute finding. Other: No ascites or pneumoperitoneum. Musculoskeletal: No acute abnormalities. Advanced lumbar spine degeneration with mild scoliosis. Spinal stenosis and foraminal impingement at L3-4 and L4-5 due to extensive endplate ridging. IMPRESSION: 1. Sigmoid diverticulitis in the same distribution as seen by CT in 2021. Phlegmon is noted along the anti mesenteric wall without drainable fluid collection or pneumoperitoneum. 2. Cholelithiasis. 3. Atelectasis at the bases. Electronically Signed   By: Monte Fantasia M.D.   On: 09/01/2020 04:48   DG Chest Port 1 View  Result Date: 09/14/2020 CLINICAL DATA:  History of left upper lobe mass status post bronchoscopy EXAM: PORTABLE CHEST 1 VIEW COMPARISON:  09/04/2020  FINDINGS: Cardiac shadow is at the upper limits of normal in size but accentuated by the portable technique. Left upper lobe mass lesion is again noted with fiducial marker in place. No pneumothorax is noted. No focal infiltrate or effusion is seen. No bony abnormality is noted. IMPRESSION: Status post bronchoscopy without evidence of pneumothorax. Electronically Signed   By: Inez Catalina M.D.   On: 09/14/2020 15:20   ECHOCARDIOGRAM COMPLETE  Result Date: 09/04/2020    ECHOCARDIOGRAM REPORT   Patient Name:   Raymond [name] Date of Exam: 09/04/2020 Medical Rec #:  [id number]       Height:       70.0 in Accession #:    [id number]      Weight:       297.8 lb Date of Birth:  January 21, 1960       BSA:          2.472 m Patient Age:  60 years        BP:           160/76 mmHg Patient Gender: M               HR:           81 bpm. Exam Location:  Inpatient Procedure: 2D Echo Indications:    pulmonary embolus  History:        Patient has prior history of Echocardiogram examinations, most                 recent 05/21/2019. Risk Factors:Hypertension.  Sonographer:    Johny Chess RDCS Referring Phys: [id number] RAVI [place]  1. Left ventricular ejection fraction, by estimation, is 65 to 70%. The left ventricle has normal function. The left ventricle has no regional wall motion abnormalities. There is moderate concentric left ventricular hypertrophy. Left ventricular diastolic parameters were normal.  2. Right ventricular systolic function is normal. The right ventricular size is normal. Tricuspid regurgitation signal is inadequate for assessing PA pressure.  3. The mitral valve is normal in structure. No evidence of mitral valve regurgitation. No evidence of mitral stenosis.  4. The aortic valve was not well visualized. Aortic valve regurgitation is not visualized. No aortic stenosis is present.  5. The inferior vena cava is normal in size with greater than 50% respiratory variability, suggesting right atrial  pressure of 3 mmHg. Comparison(s): A prior study was performed on 05/21/2019. LV is slightly more vigorous, aortic sizing in this study is within normal limits for age and BSA. FINDINGS  Left Ventricle: Left ventricular ejection fraction, by estimation, is 65 to 70%. The left ventricle has normal function. The left ventricle has no regional wall motion abnormalities. The left ventricular internal cavity size was normal in size. There is  moderate concentric left ventricular hypertrophy. Left ventricular diastolic parameters were normal. Right Ventricle: The right ventricular size is normal. No increase in right ventricular wall thickness. Right ventricular systolic function is normal. Tricuspid regurgitation signal is inadequate for assessing PA pressure. Left Atrium: Left atrial size was normal in size. Right Atrium: Right atrial size was normal in size. Pericardium: There is no evidence of pericardial effusion. Mitral Valve: The mitral valve is normal in structure. No evidence of mitral valve regurgitation. No evidence of mitral valve stenosis. Tricuspid Valve: The tricuspid valve is normal in structure. Tricuspid valve regurgitation is not demonstrated. No evidence of tricuspid stenosis. Aortic Valve: The aortic valve was not well visualized. Aortic valve regurgitation is not visualized. No aortic stenosis is present. Pulmonic Valve: Discordance between color and spectral Doppler. The pulmonic valve was grossly normal. Pulmonic valve regurgitation is trivial. No evidence of pulmonic stenosis. Aorta: The aortic root and ascending aorta are structurally normal, with no evidence of dilitation. Venous: The inferior vena cava is normal in size with greater than 50% respiratory variability, suggesting right atrial pressure of 3 mmHg. IAS/Shunts: The atrial septum is grossly normal.  LEFT VENTRICLE PLAX 2D LVIDd:         5.40 cm  Diastology LVIDs:         3.30 cm  LV e' medial:    9.25 cm/s LV PW:         1.40 cm  LV  E/e' medial:  9.6 LV IVS:        1.30 cm  LV e' lateral:   12.20 cm/s LVOT diam:     2.50 cm  LV E/e' lateral: 7.3 LV SV:  129 LV SV Index:   52 LVOT Area:     4.91 cm  IVC IVC diam: 1.90 cm LEFT ATRIUM             Index       RIGHT ATRIUM           Index LA diam:        4.40 cm 1.78 cm/m  RA Area:     22.30 cm LA Vol (A2C):   83.2 ml 33.65 ml/m RA Volume:   63.50 ml  25.68 ml/m LA Vol (A4C):   73.3 ml 29.65 ml/m LA Biplane Vol: 82.6 ml 33.41 ml/m  AORTIC VALVE LVOT Vmax:   137.00 cm/s LVOT Vmean:  95.100 cm/s LVOT VTI:    0.262 m  AORTA Ao Root diam: 3.70 cm Ao Asc diam:  3.50 cm MITRAL VALVE MV Area (PHT): 3.60 cm    SHUNTS MV Decel Time: 211 msec    Systemic VTI:  0.26 m MV E velocity: 88.80 cm/s  Systemic Diam: 2.50 cm MV A velocity: 70.40 cm/s MV E/A ratio:  1.26 Rudean Haskell MD Electronically signed by Rudean Haskell MD Signature Date/Time: 09/04/2020/4:11:13 PM    Final    VAS [place] LOWER EXTREMITY VENOUS (DVT)  Result Date: 09/04/2020  Lower Venous DVT Study Patient Name:  Raymond [name]  Date of Exam:   09/04/2020 Medical Rec #: [id number]        Accession #:    [id number] Date of Birth: 05-21-1960        Patient Gender: M Patient Age:   61 years Exam Location:  [facility] Procedure:      VAS [place] LOWER EXTREMITY VENOUS (DVT) Referring Phys: RAVI PAHWANI --------------------------------------------------------------------------------  Indications: Pulmonary embolism.  Comparison Study: 02/28/15 prior Performing Technologist: Archie Patten RVS  Examination Guidelines: A complete evaluation includes B-mode imaging, spectral Doppler, color Doppler, and power Doppler as needed of all accessible portions of each vessel. Bilateral testing is considered an integral part of a complete examination. Limited examinations for reoccurring indications may be performed as noted. The reflux portion of the exam is performed with the patient in reverse Trendelenburg.   +---------+---------------+---------+-----------+----------+--------------+ RIGHT    CompressibilityPhasicitySpontaneityPropertiesThrombus Aging +---------+---------------+---------+-----------+----------+--------------+ CFV      Full           Yes      Yes                                 +---------+---------------+---------+-----------+----------+--------------+ SFJ      Full                                                        +---------+---------------+---------+-----------+----------+--------------+ FV Prox  Full                                                        +---------+---------------+---------+-----------+----------+--------------+ FV Mid   Full                                                        +---------+---------------+---------+-----------+----------+--------------+  FV DistalFull                                                        +---------+---------------+---------+-----------+----------+--------------+ PFV      Full                                                        +---------+---------------+---------+-----------+----------+--------------+ POP      Full           Yes      Yes                                 +---------+---------------+---------+-----------+----------+--------------+ PTV      Full                                                        +---------+---------------+---------+-----------+----------+--------------+ PERO     Full                                                        +---------+---------------+---------+-----------+----------+--------------+   +---------+---------------+---------+-----------+----------+-------------------+ LEFT     CompressibilityPhasicitySpontaneityPropertiesThrombus Aging      +---------+---------------+---------+-----------+----------+-------------------+ CFV      Full           Yes      Yes                                       +---------+---------------+---------+-----------+----------+-------------------+ SFJ      Full                                                             +---------+---------------+---------+-----------+----------+-------------------+ FV Prox  Full                                                             +---------+---------------+---------+-----------+----------+-------------------+ FV Mid   Full                                                             +---------+---------------+---------+-----------+----------+-------------------+ FV DistalFull                                                             +---------+---------------+---------+-----------+----------+-------------------+  PFV      Full                                                             +---------+---------------+---------+-----------+----------+-------------------+ POP      Full           Yes      Yes                                      +---------+---------------+---------+-----------+----------+-------------------+ PTV      Full                                                             +---------+---------------+---------+-----------+----------+-------------------+ PERO                                                  Not well visualized +---------+---------------+---------+-----------+----------+-------------------+     Summary: RIGHT: - There is no evidence of deep vein thrombosis in the lower extremity.  - A cystic structure is found in the popliteal fossa.  LEFT: - There is no evidence of deep vein thrombosis in the lower extremity.  - No cystic structure found in the popliteal fossa.  *See table(s) above for measurements and observations. Electronically signed by Deitra Mayo MD on 09/04/2020 at 4:20:21 PM.    Final    DG C-ARM BRONCHOSCOPY  Result Date: 09/14/2020 C-ARM BRONCHOSCOPY: Fluoroscopy was utilized by the requesting physician.  No radiographic  interpretation.       IMPRESSION/PLAN: 1. At least stage IIIA, cT2N2M0, NSCLC, adenocarcinoma of the LUL. Dr. Lisbeth Renshaw discusses the pathology findings and reviews the nature of locally advanced lung disease. The consensus from the thoracic oncology conference includes completing the work up of his cancer with MRI brain and PET imaging which is scheduled on 10/02/20. We will follow up these results next week. He may be a candidate for neoadjuvant chemotherapy followed by resection versus resection and adjuvant therapy versus chemoRT. We discussed the risks, benefits, short, and long term effects of radiotherapy, as well as the curative intent, and the patient is interested in proceeding. Dr. Lisbeth Renshaw discusses the delivery and logistics of defintive dose radiotherapy and that he could offer up to 6 1/2 weeks of radiotherapy to the chest. We will be in touch to review his imaging results and subsequently make more concrete plans for his treatment.   The above documentation reflects my direct findings during this shared patient visit. Please see the separate note by Dr. Lisbeth Renshaw on this date for the remainder of the patient's plan of care.    Carola Rhine, [facility]   **Disclaimer: This note was dictated with voice recognition software. Similar sounding words can inadvertently be transcribed and this note may contain transcription errors which may not have been corrected upon publication of note.**

## 2020-09-24 NOTE — Progress Notes (Signed)
The proposed treatment discussed in conference is for discussion purpose only and is not a binding recommendation. The patient was not physically examined, or presented with their treatment options. Therefore, final treatment plans cannot be decided.

## 2020-09-24 NOTE — Patient Instructions (Signed)
Thank you for choosing [facility] to provide your care.   Should you have questions after your visit to the [facility]), please contact this office at [phone] between 8:30 AM and 4:30 PM.  Voice mails left after 4:00 PM may not be returned until the following business day.  Calls received after 4:30 PM will be answered by an off-site Nurse Triage Line.    Prescription Refills:  Please have your pharmacy contact us directly for most prescription requests.  Contact the office directly for refills of narcotics (pain medications). Allow 48-72 hours for refills.  Appointments: Please contact the [facility] scheduling department [phone] for questions regarding [facility] appointment scheduling.  Contact the schedulers with any scheduling changes so that your appointment can be rescheduled in a timely manner.   Central Scheduling for [facility] [phone] - Call to schedule procedures such as PET scans, CT scans, MRI, Ultrasound, etc.  To afford each patient quality time with our providers, please arrive 30 minutes before your scheduled appointment time.  If you arrive late for your appointment, you may be asked to reschedule.  We strive to give you quality time with our providers, and arriving late affects you and other patients whose appointments are after yours. If you are a no show for multiple scheduled visits, you may be dismissed from the clinic at the providers discretion.     Resources: [place] [employer] [phone] for additional information on assistance programs or assistance connecting with community support programs   [place]  [phone]: Information regarding food stamps, Medicaid, and utility assistance [employer] [place] [employer]'s shared-ride transportation service for eligible riders who have a disability that prevents them from riding the fixed route bus.   [place] [phone]  Helps people with Medicare understand their rights and benefits, navigate the Medicare system, and secure the quality healthcare they deserve [employer] 630 475 1551 Assists patients locate various types of support and financial assistance Cancer Care: 1-800-813-HOPE (705) 093-9730) Provides financial assistance, online support groups, medication/co-pay assistance.   Transportation Assistance for appointments at [facility]: [employer] [phone]  Again, thank you for choosing [facility] for your care.        Steps to Quit Smoking Smoking tobacco is the leading cause of preventable death. It can affect almost every organ in the body. Smoking puts you and people around you at risk for many serious, long-lasting (chronic) diseases. Quitting smoking can be hard, but it is one of the best things that you can do for your health. It is never too late to quit. How do I get ready to quit? When you decide to quit smoking, make a plan to help you succeed. Before you quit: Pick a date to quit. Set a date within the next 2 weeks to give you time to prepare. Write down the reasons why you are quitting. Keep this list in places where you will see it often. Tell your family, friends, and co-workers that you are quitting. Their support is important. Talk with your doctor about the choices that may help you quit. Find out if your health insurance will pay for these treatments. Know the people, places, things, and activities that make you want to smoke (triggers). Avoid them. What first steps can I take to quit smoking? Throw away all cigarettes at home, at work, and in your car. Throw away the things that you use when you smoke, such as ashtrays and  lighters. Clean your car. Make sure to empty the ashtray. Clean your home, including curtains and carpets. What can I do to help me quit smoking? Talk with your doctor about taking medicines and seeing a counselor at the same time.  You are more likely to succeed when you do both. If you are pregnant or breastfeeding, talk with your doctor about counseling or other ways to quit smoking. Do not take medicine to help you quit smoking unless your doctor tells you to do so. To quit smoking: Quit right away Quit smoking totally, instead of slowly cutting back on how much you smoke over a period of time. Go to counseling. You are more likely to quit if you go to counseling sessions regularly. Take medicine You may take medicines to help you quit. Some medicines need a prescription, and some you can buy over-the-counter. Some medicines may contain a drug called nicotine to replace the nicotine in cigarettes. Medicines may: Help you to stop having the desire to smoke (cravings). Help to stop the problems that come when you stop smoking (withdrawal symptoms). Your doctor may ask you to use: Nicotine patches, gum, or lozenges. Nicotine inhalers or sprays. Non-nicotine medicine that is taken by mouth. Find resources Find resources and other ways to help you quit smoking and remain smoke-free after you quit. These resources are most helpful when you use them often. They include: Online chats with a Social worker. Phone quitlines. Printed Furniture conservator/restorer. Support groups or group counseling. Text messaging programs. Mobile phone apps. Use apps on your mobile phone or tablet that can help you stick to your quit plan. There are many free apps for mobile phones and tablets as well as websites. Examples include Quit Guide from the [employer] and smokefree.gov  What things can I do to make it easier to quit?  Talk to your family and friends. Ask them to support and encourage you. Call a phone quitline (1-800-QUIT-NOW), reach out to support groups, or work with a Social worker. Ask people who smoke to not smoke around you. Avoid places that make you want to smoke, such as: Bars. Parties. Smoke-break areas at work. Spend time with people who do  not smoke. Lower the stress in your life. Stress can make you want to smoke. Try these things to help your stress: Getting regular exercise. Doing deep-breathing exercises. Doing yoga. Meditating. Doing a body scan. To do this, close your eyes, focus on one area of your body at a time from head to toe. Notice which parts of your body are tense. Try to relax the muscles in those areas. How will I feel when I quit smoking? Day 1 to 3 weeks Within the first 24 hours, you may start to have some problems that come from quitting tobacco. These problems are very bad 2-3 days after you quit, but they do not often last for more than 2-3 weeks. You may get these symptoms: Mood swings. Feeling restless, nervous, angry, or annoyed. Trouble concentrating. Dizziness. Strong desire for high-sugar foods and nicotine. Weight gain. Trouble pooping (constipation). Feeling like you may vomit (nausea). Coughing or a sore throat. Changes in how the medicines that you take for other issues work in your body. Depression. Trouble sleeping (insomnia). Week 3 and afterward After the first 2-3 weeks of quitting, you may start to notice more positive results, such as: Better sense of smell and taste. Less coughing and sore throat. Slower heart rate. Lower blood pressure. Clearer skin. Better breathing. Fewer sick days. Quitting  smoking can be hard. Do not give up if you fail the first time. Some people need to try a few times before they succeed. Do your best to stick to your quit plan, and talk with your doctor if you have any questions or concerns. Summary Smoking tobacco is the leading cause of preventable death. Quitting smoking can be hard, but it is one of the best things that you can do for your health. When you decide to quit smoking, make a plan to help you succeed. Quit smoking right away, not slowly over a period of time. When you start quitting, seek help from your doctor, family, or  friends. This information is not intended to replace advice given to you by your health care provider. Make sure you discuss any questions you have with your health care provider. Document Revised: 09/14/2018 Document Reviewed: 03/10/2018 Elsevier Patient Education  [place].

## 2020-09-25 ENCOUNTER — Telehealth: Payer: Self-pay | Admitting: Internal Medicine

## 2020-09-25 NOTE — Telephone Encounter (Signed)
Scheduled appts per 9/22 los - patient is aware of appt date and time .

## 2020-09-28 ENCOUNTER — Other Ambulatory Visit: Payer: Self-pay | Admitting: Medical Oncology

## 2020-09-28 DIAGNOSIS — R110 Nausea: Secondary | ICD-10-CM

## 2020-09-28 DIAGNOSIS — Z5111 Encounter for antineoplastic chemotherapy: Secondary | ICD-10-CM

## 2020-09-28 MED ORDER — PROCHLORPERAZINE MALEATE 10 MG PO TABS
10.0000 mg | ORAL_TABLET | Freq: Four times a day (QID) | ORAL | 0 refills | Status: DC | PRN
Start: 2020-09-28 — End: 2021-02-25

## 2020-09-28 NOTE — Progress Notes (Signed)
Pharmacist Chemotherapy Monitoring - Initial Assessment    Anticipated start date: 10/05/20   The following has been reviewed per standard work regarding the patient's treatment regimen: The patient's diagnosis, treatment plan and drug doses, and organ/hematologic function Lab orders and baseline tests specific to treatment regimen  The treatment plan start date, drug sequencing, and pre-medications Prior authorization status  Patient's documented medication list, including drug-drug interaction screen and prescriptions for anti-emetics and supportive care specific to the treatment regimen The drug concentrations, fluid compatibility, administration routes, and timing of the medications to be used The patient's access for treatment and lifetime cumulative dose history, if applicable  The patient's medication allergies and previous infusion related reactions, if applicable   Changes made to treatment plan:  Compazine 10 mg p.o. every 6 hours as needed  not sent to pharmacy yet per med list - messaged RN   Follow up needed:  prescriptions needed for anti-emetics  Raymond [name], PharmD Pharmacy Resident  09/28/2020 8:40 AM

## 2020-09-29 ENCOUNTER — Encounter: Payer: Self-pay | Admitting: *Deleted

## 2020-09-29 NOTE — Progress Notes (Signed)
I followed up on Raymond [name] molecular test results.  His blood sample  has completed but tissue and PDL 1 is still pending.

## 2020-10-01 ENCOUNTER — Inpatient Hospital Stay: Payer: Self-pay

## 2020-10-01 ENCOUNTER — Encounter: Payer: Self-pay | Admitting: Internal Medicine

## 2020-10-01 ENCOUNTER — Other Ambulatory Visit: Payer: Self-pay

## 2020-10-01 NOTE — Progress Notes (Signed)
Met with patient at registration to introduce myself as Arboriculturist and to offer available resources.  Discussed one-time $1000 Radio broadcast assistant to assist with personal expenses while going through treatment.  Gave him my card if interested in applying and for any additional financial questions or concerns.

## 2020-10-02 ENCOUNTER — Encounter (HOSPITAL_COMMUNITY)
Admission: RE | Admit: 2020-10-02 | Discharge: 2020-10-02 | Disposition: A | Discharge status: Home / Self Care | Payer: Self-pay | Source: Ambulatory Visit | Attending: Emergency Medicine | Admitting: Emergency Medicine

## 2020-10-02 ENCOUNTER — Ambulatory Visit (HOSPITAL_COMMUNITY)
Admission: RE | Admit: 2020-10-02 | Discharge: 2020-10-02 | Disposition: A | Discharge status: Home / Self Care | Payer: Self-pay | Source: Ambulatory Visit | Attending: Emergency Medicine | Admitting: Emergency Medicine

## 2020-10-02 DIAGNOSIS — C3490 Malignant neoplasm of unspecified part of unspecified bronchus or lung: Secondary | ICD-10-CM | POA: Insufficient documentation

## 2020-10-02 LAB — GLUCOSE, CAPILLARY: Glucose-Capillary: 121 mg/dL — ABNORMAL HIGH (ref 70–99)

## 2020-10-02 MED ORDER — FLUDEOXYGLUCOSE F - 18 (FDG) INJECTION: 14.1000 | Freq: Once | INTRAVENOUS | Status: DC

## 2020-10-02 MED ORDER — GADOBUTROL 1 MMOL/ML IV SOLN
10.0000 mL | Freq: Once | INTRAVENOUS | Status: AC | PRN
  Administered 2020-10-02: 10 mL via INTRAVENOUS

## 2020-10-02 MED FILL — Dexamethasone Sodium Phosphate Inj 100 MG/10ML: INTRAMUSCULAR | Qty: 1 | Status: AC

## 2020-10-02 NOTE — Telephone Encounter (Signed)
Forms are to be returned to patient, spoke with patient and he will pick up on Monday. Informed patient that he could pay the $29 form dee when he picks up. Patient verbalized understanding.

## 2020-10-02 NOTE — Telephone Encounter (Signed)
Dr. Lamonte Sakai signed FMLA form on 9/29 - patient will need to pay $29 fee before we fax it to insurance company.

## 2020-10-05 ENCOUNTER — Encounter: Payer: Self-pay | Admitting: *Deleted

## 2020-10-05 ENCOUNTER — Telehealth: Payer: Self-pay | Admitting: Internal Medicine

## 2020-10-05 ENCOUNTER — Other Ambulatory Visit: Payer: Self-pay | Admitting: Internal Medicine

## 2020-10-05 ENCOUNTER — Ambulatory Visit: Payer: Self-pay

## 2020-10-05 ENCOUNTER — Other Ambulatory Visit: Payer: Self-pay

## 2020-10-05 ENCOUNTER — Inpatient Hospital Stay: Payer: Self-pay | Attending: Internal Medicine

## 2020-10-05 ENCOUNTER — Inpatient Hospital Stay: Payer: Self-pay

## 2020-10-05 ENCOUNTER — Inpatient Hospital Stay (HOSPITAL_BASED_OUTPATIENT_CLINIC_OR_DEPARTMENT_OTHER): Payer: Self-pay | Admitting: Physician Assistant

## 2020-10-05 ENCOUNTER — Other Ambulatory Visit: Payer: Self-pay | Admitting: Radiation Therapy

## 2020-10-05 VITALS — BP 168/95 | HR 78 | Temp 98.2°F | Resp 18

## 2020-10-05 DIAGNOSIS — Z5111 Encounter for antineoplastic chemotherapy: Secondary | ICD-10-CM | POA: Insufficient documentation

## 2020-10-05 DIAGNOSIS — C7931 Secondary malignant neoplasm of brain: Secondary | ICD-10-CM | POA: Insufficient documentation

## 2020-10-05 DIAGNOSIS — C3492 Malignant neoplasm of unspecified part of left bronchus or lung: Secondary | ICD-10-CM | POA: Insufficient documentation

## 2020-10-05 DIAGNOSIS — C3412 Malignant neoplasm of upper lobe, left bronchus or lung: Secondary | ICD-10-CM

## 2020-10-05 DIAGNOSIS — K5792 Diverticulitis of intestine, part unspecified, without perforation or abscess without bleeding: Secondary | ICD-10-CM

## 2020-10-05 LAB — CMP (CANCER CENTER ONLY)
ALT: 16 U/L (ref 0–44)
AST: 15 U/L (ref 15–41)
Albumin: 3.5 g/dL (ref 3.5–5.0)
Alkaline Phosphatase: 97 U/L (ref 38–126)
Anion gap: 10 (ref 5–15)
BUN: 11 mg/dL (ref 6–20)
CO2: 26 mmol/L (ref 22–32)
Calcium: 8.9 mg/dL (ref 8.9–10.3)
Chloride: 109 mmol/L (ref 98–111)
Creatinine: 0.95 mg/dL (ref 0.61–1.24)
GFR, Estimated: >60 mL/min (ref >60)
Glucose, Bld: 100 mg/dL — ABNORMAL HIGH (ref 70–99)
Potassium: 4.2 mmol/L (ref 3.5–5.1)
Sodium: 145 mmol/L (ref 135–145)
Total Bilirubin: 0.4 mg/dL (ref 0.3–1.2)
Total Protein: 7 g/dL (ref 6.5–8.1)

## 2020-10-05 LAB — CBC WITH DIFFERENTIAL (CANCER CENTER ONLY)
Abs Immature Granulocytes: 0.04 10*3/uL (ref 0.00–0.07)
Basophils Absolute: 0 10*3/uL (ref 0.0–0.1)
Basophils Relative: 1 %
Eosinophils Absolute: 0.4 10*3/uL (ref 0.0–0.5)
Eosinophils Relative: 5 %
HCT: 42.6 % (ref 39.0–52.0)
Hemoglobin: 13.7 g/dL (ref 13.0–17.0)
Immature Granulocytes: 1 %
Lymphocytes Relative: 25 %
Lymphs Abs: 2 10*3/uL (ref 0.7–4.0)
MCH: 28.4 pg (ref 26.0–34.0)
MCHC: 32.2 g/dL (ref 30.0–36.0)
MCV: 88.2 fL (ref 80.0–100.0)
Monocytes Absolute: 0.7 10*3/uL (ref 0.1–1.0)
Monocytes Relative: 8 %
Neutro Abs: 4.9 10*3/uL (ref 1.7–7.7)
Neutrophils Relative %: 60 %
Platelet Count: 207 10*3/uL (ref 150–400)
RBC: 4.83 MIL/uL (ref 4.22–5.81)
RDW: 13.4 % (ref 11.5–15.5)
WBC Count: 7.9 10*3/uL (ref 4.0–10.5)
nRBC: 0 % (ref 0.0–0.2)

## 2020-10-05 MED ORDER — SODIUM CHLORIDE 0.9 % IV SOLN
45.0000 mg/m2 | Freq: Once | INTRAVENOUS | Status: DC
  Filled 2020-10-05: qty 19

## 2020-10-05 MED ORDER — CIPROFLOXACIN HCL 500 MG PO TABS: 500.0000 mg | ORAL_TABLET | Freq: Two times a day (BID) | ORAL | 0 refills | Status: DC

## 2020-10-05 MED ORDER — FAMOTIDINE 20 MG IN NS 100 ML IVPB
20.0000 mg | Freq: Once | INTRAVENOUS | Status: AC
  Administered 2020-10-05: 20 mg via INTRAVENOUS
  Filled 2020-10-05: qty 100

## 2020-10-05 MED ORDER — DIPHENHYDRAMINE HCL 50 MG/ML IJ SOLN
50.0000 mg | Freq: Once | INTRAMUSCULAR | Status: AC
  Administered 2020-10-05: 50 mg via INTRAVENOUS
  Filled 2020-10-05: qty 1

## 2020-10-05 MED ORDER — SODIUM CHLORIDE 0.9 % IV SOLN: Freq: Once | INTRAVENOUS | Status: AC

## 2020-10-05 MED ORDER — FOLIC ACID 1 MG PO TABS: 1.0000 mg | ORAL_TABLET | Freq: Every day | ORAL | 2 refills | Status: DC

## 2020-10-05 MED ORDER — PALONOSETRON HCL INJECTION 0.25 MG/5ML
0.2500 mg | Freq: Once | INTRAVENOUS | Status: AC
  Administered 2020-10-05: 0.25 mg via INTRAVENOUS
  Filled 2020-10-05: qty 5

## 2020-10-05 MED ORDER — CYANOCOBALAMIN 1000 MCG/ML IJ SOLN
INTRAMUSCULAR | Status: AC
  Filled 2020-10-05: qty 1

## 2020-10-05 MED ORDER — SODIUM CHLORIDE 0.9 % IV SOLN
300.0000 mg | Freq: Once | INTRAVENOUS | Status: DC
  Filled 2020-10-05: qty 30

## 2020-10-05 MED ORDER — SODIUM CHLORIDE 0.9 % IV SOLN: 300.0000 mg | Freq: Once | INTRAVENOUS | Status: DC

## 2020-10-05 MED ORDER — SODIUM CHLORIDE 0.9 % IV SOLN
10.0000 mg | Freq: Once | INTRAVENOUS | Status: AC
  Administered 2020-10-05: 10 mg via INTRAVENOUS
  Filled 2020-10-05: qty 10

## 2020-10-05 MED ORDER — CYANOCOBALAMIN 1000 MCG/ML IJ SOLN
1000.0000 ug | Freq: Once | INTRAMUSCULAR | Status: AC
  Administered 2020-10-05: 1000 ug via INTRAMUSCULAR

## 2020-10-05 MED ORDER — METRONIDAZOLE 500 MG PO TABS: 500.0000 mg | ORAL_TABLET | Freq: Three times a day (TID) | ORAL | 0 refills | Status: DC

## 2020-10-05 NOTE — Telephone Encounter (Signed)
Got a call report on 10/2 CT showing incidental acute diverticulitis   Will need to be addressed today by Dr Lamonte Sakai (who apparently ordered it)  or doc of the day

## 2020-10-05 NOTE — Progress Notes (Signed)
I followed up on Raymond [name] schedule for his planned concurrent chemo rad. He starts his chemo today but did not see appt for rad onc in Epic. I reached out to rad onc scheduling team for an update. Wait for response.

## 2020-10-05 NOTE — Patient Instructions (Addendum)
Summary:  -There are two main categories of lung cancer, they are named based on the size of the cancer cell. One is called Non-Small cell lung cancer. The other type is Small Cell Lung Cancer -The sample (biopsy) that they took of your tumor was consistent with a subtype of Non-small cell lung cancer called Adenocarcinoma. This is the most common type of lung cancer.  -We covered a lot of important information at your appointment today regarding what the treatment plan is moving forward. Here are the the main points that were discussed at your office visit with [place] today:  -The treatment that you will receive consists of two chemotherapy drugs, called Carboplatin and Alimta (also called Pemetrexed) and one immunotherapy drug called Keytruda (pembrolizumab).  -We are planning on starting your treatment next week on 10/12/20 but before your start your treatment, I would like you to attend a Chemotherapy Education Class. This involves having you sit down with one of our nurse educators. She will discuss with your one-on-one more details about your treatment as well as general information about resources here at the cancer center.  -Your treatment will be given once every 3 weeks. We will check your labs once a week for the first ~5 treatments just to make sure that important components of your blood are in an acceptable range -We will get a CT scan after 3 treatments to check on the progress of treatment  Medications:  -I have sent a few important medication prescriptions to your pharmacy.  -Compazine was sent to your pharmacy. This medication is for nausea. You may take this every 6 hours as needed if you feel nauseous.  -I have also sent a prescription for 1 mg of folic acid to your pharmacy. We need you to take 1 tablet every day.  -We will administer vitamin B12 every 9 weeks while you are here in the clinic. You have received your first dose today.  -I also sent you antibiotics for the possible  diverticulitis for the next 10 days. Please pick this up and start taking this. I sent two antibiotics. Please avoid alcohol as this can make you sick.   Referrals or Imaging: -Bryson Ha and Dr. Lisbeth Renshaw will be in touch with you about the special MRI they need to do regarding treatment to the small spots in the brain.    Follow up:  -We will see you back for a follow up visit 1 week after your first treatment to see how it went and help manage any side effects of treatment that you may have   -If you need to reach [place] at any time, the main office number to the cancer center is [phone], when you call, ask to speak to either Cassie's or Dr. Worthy Flank nurse.

## 2020-10-05 NOTE — Progress Notes (Signed)
[name] OFFICE PROGRESS NOTE  Raymond [name] [address] [place] [address]  DIAGNOSIS:  stage IV (T2b, N2, M1c) non-small cell lung cancer, adenocarcinoma presented with left upper lobe lung mass in addition to left hilar and mediastinal lymphadenopathy diagnosed in September 2022 pending further staging work-up.   PRIOR THERAPY: None   CURRENT THERAPY: Carboplatin for an AUC of 5, Alimta 500 mg per metered squared, Keytruda 200 mg IV every 3 weeks. First dose on 10/12/20.   INTERVAL HISTORY: Raymond [name] 60 y.o. male returns to the clinic today for an add on visit today.  The patient was recently diagnosed with stage lung cancer, but was pending the staging work-up with a brain MRI and a PET scan.  These were performed on 10/02/2020.   In the interval since his last appointment, the patient's been feeling fairly well.  The patient's staging PET scan showed incidental uncomplicated diverticulitis.  The patient reports that he had full episodes of diverticulitis this year, the most recent being in August 2022.  He also estimates that he had diverticulitis in either January or February 2022.  The patient denies any recent fevers, chills, or changes in bowel habits.  He denies any blood in the stool.  The patient denies any abdominal pain at this time.  He has a prescription for Percocet from the last bout of diverticulitis that he had.  His last bowel movement was yesterday and was normal color and consistency and he reports it was light brown.  He completed his last round of antibiotics approximately on 09/11/2020.  The patient denies any appetite changes, changes with his breathing, chest pain, or hemoptysis.  Denies any nausea or vomiting.  Denies any headache or visual changes.  The patient was seen today to review his PET scan and brain MRI results.   MEDICAL HISTORY: Past Medical History:  Diagnosis Date   Arthritis    Baker's cyst    right    Diverticulitis    GERD (gastroesophageal reflux disease)    Heart attack ([facility]) 01/04/2004   History of hiatal hernia    Hypertension    Malignant neoplasm of upper lobe of left lung ([facility]) 09/24/2020   Pneumonia    as a child   Sleep apnea    no longer on Cpap    ALLERGIES:  has No Known Allergies.  MEDICATIONS:  Current Outpatient Medications  Medication Sig Dispense Refill   ciprofloxacin (CIPRO) 500 MG tablet Take 1 tablet (500 mg total) by mouth 2 (two) times daily. 20 tablet 0   folic acid (FOLVITE) 1 MG tablet Take 1 tablet (1 mg total) by mouth daily. 30 tablet 2   metroNIDAZOLE (FLAGYL) 500 MG tablet Take 1 tablet (500 mg total) by mouth 3 (three) times daily. 30 tablet 0   acetaminophen (TYLENOL) 650 MG CR tablet Take 650 mg by mouth every 8 (eight) hours as needed for pain.     etodolac (LODINE) 500 MG tablet Take 500 mg by mouth 2 (two) times daily.     gabapentin (NEURONTIN) 600 MG tablet Take 600 mg by mouth at bedtime.     losartan-hydrochlorothiazide (HYZAAR) 50-12.5 MG tablet Take 1 tablet by mouth daily.     Misc Natural Products (OSTEO BI-FLEX ADV JOINT SHIELD) TABS Take 1 tablet by mouth daily.     MULTIPLE VITAMIN PO Take 1 tablet by mouth daily.     omeprazole (PRILOSEC) 10 MG capsule Take 10 mg  by mouth as needed.     oxyCODONE-acetaminophen (PERCOCET) 5-325 MG tablet Take 1-2 tablets by mouth every 4 (four) hours as needed for severe pain. 15 tablet 0   Probiotic Product (PROBIOTIC DAILY PO) Take 1 capsule by mouth daily.     prochlorperazine (COMPAZINE) 10 MG tablet Take 1 tablet (10 mg total) by mouth every 6 (six) hours as needed for nausea or vomiting. 30 tablet 0   Wheat Dextrin (BENEFIBER DRINK MIX) PACK Take by mouth.     No current facility-administered medications for this visit.   Facility-Administered Medications Ordered in Other Visits  Medication Dose Route Frequency Provider Last Rate Last Admin   fludeoxyglucose F - 18 (FDG) injection 00.9  millicurie  38.1 millicurie Intravenous Once Felipa Emory, [name]        SURGICAL HISTORY:  Past Surgical History:  Procedure Laterality Date   BRONCHIAL BIOPSY  09/14/2020   Procedure: BRONCHIAL BIOPSIES;  Surgeon: Collene Gobble, [name];  Location: [facility] ENDOSCOPY;  Service: Cardiopulmonary;;   BRONCHIAL BRUSHINGS  09/14/2020   Procedure: BRONCHIAL BRUSHINGS;  Surgeon: Collene Gobble, [name];  Location: [facility];  Service: Cardiopulmonary;;   BRONCHIAL NEEDLE ASPIRATION BIOPSY  09/14/2020   Procedure: BRONCHIAL NEEDLE ASPIRATION BIOPSIES;  Surgeon: Collene Gobble, [name];  Location: [facility];  Service: Cardiopulmonary;;   CARDIAC CATHETERIZATION     CARPAL TUNNEL RELEASE     right   FIDUCIAL MARKER PLACEMENT  09/14/2020   Procedure: FIDUCIAL MARKER PLACEMENT;  Surgeon: Collene Gobble, [name];  Location: [facility];  Service: Cardiopulmonary;;   HAND SURGERY     left   KNEE SURGERY     left   VIDEO BRONCHOSCOPY N/A 09/14/2020   Procedure: ROBOTIC VIDEO BRONCHOSCOPY WITH FLUORO;  Surgeon: Collene Gobble, [name];  Location: [facility];  Service: Cardiopulmonary;  Laterality: N/A;   VIDEO BRONCHOSCOPY WITH ENDOBRONCHIAL ULTRASOUND N/A 09/14/2020   Procedure: VIDEO BRONCHOSCOPY WITH ENDOBRONCHIAL ULTRASOUND;  Surgeon: Collene Gobble, [name];  Location: [facility];  Service: Cardiopulmonary;  Laterality: N/A;   VIDEO BRONCHOSCOPY WITH RADIAL ENDOBRONCHIAL ULTRASOUND  09/14/2020   Procedure: VIDEO BRONCHOSCOPY WITH RADIAL ENDOBRONCHIAL ULTRASOUND;  Surgeon: Collene Gobble, [name];  Location: [facility] ENDOSCOPY;  Service: Cardiopulmonary;;    REVIEW OF SYSTEMS:   Review of Systems  Constitutional: Negative for appetite change, chills, fatigue, fever and unexpected weight change.  HENT:   Negative for mouth sores, nosebleeds, sore throat and trouble swallowing.   Eyes: Negative for eye problems and icterus.  Respiratory: Negative for cough, hemoptysis, shortness of breath and wheezing.   Cardiovascular:  Negative for chest pain and leg swelling.  Gastrointestinal: Negative for abdominal pain, constipation, diarrhea, nausea and vomiting.  Genitourinary: Negative for bladder incontinence, difficulty urinating, dysuria, frequency and hematuria.   Musculoskeletal: Negative for back pain, gait problem, neck pain and neck stiffness.  Skin: Negative for itching and rash.  Neurological: Negative for dizziness, extremity weakness, gait problem, headaches, light-headedness and seizures.  Hematological: Negative for adenopathy. Does not bruise/bleed easily.  Psychiatric/Behavioral: Negative for confusion, depression and sleep disturbance. The patient is not nervous/anxious.     PHYSICAL EXAMINATION:  There were no vitals taken for this visit.  ECOG PERFORMANCE STATUS: 1 - Symptomatic but completely ambulatory  Physical Exam  Constitutional: Oriented to person, place, and time and well-developed, well-nourished, and in no distress. No distress.  HENT:  Head: Normocephalic and atraumatic.  Mouth/Throat: Oropharynx is clear and moist. No oropharyngeal exudate.  Eyes: Conjunctivae are normal. Right eye exhibits  no discharge. Left eye exhibits no discharge. No scleral icterus.  Neck: Normal range of motion. Neck supple.  Cardiovascular: Normal rate, regular rhythm, normal heart sounds and intact distal pulses.   Pulmonary/Chest: Effort normal and breath sounds normal. No respiratory distress. No wheezes. No rales.  Abdominal: Soft. Bowel sounds are normal. Exhibits no distension and no mass. There is no tenderness.  Musculoskeletal: Normal range of motion. Exhibits no edema.  Lymphadenopathy:    No cervical adenopathy.  Neurological: Alert and oriented to person, place, and time. Exhibits normal muscle tone. Gait normal. Coordination normal.  Skin: Skin is warm and dry. No rash noted. Not diaphoretic. No erythema. No pallor.  Psychiatric: Mood, memory and judgment normal.  Vitals  reviewed.  LABORATORY DATA: Lab Results  Component Value Date   WBC 7.9 10/05/2020   HGB 13.7 10/05/2020   HCT 42.6 10/05/2020   MCV 88.2 10/05/2020   PLT 207 10/05/2020      Chemistry      Component Value Date/Time   NA 145 10/05/2020 1100   K 4.2 10/05/2020 1100   CL 109 10/05/2020 1100   CO2 26 10/05/2020 1100   BUN 11 10/05/2020 1100   CREATININE 0.95 10/05/2020 1100      Component Value Date/Time   CALCIUM 8.9 10/05/2020 1100   ALKPHOS 97 10/05/2020 1100   AST 15 10/05/2020 1100   ALT 16 10/05/2020 1100   BILITOT 0.4 10/05/2020 1100       RADIOGRAPHIC STUDIES:  MR BRAIN W WO CONTRAST  Result Date: 10/03/2020 CLINICAL DATA:  Non-small cell lung cancer. EXAM: MRI HEAD WITHOUT AND WITH CONTRAST TECHNIQUE: Multiplanar, multiecho pulse sequences of the brain and surrounding structures were obtained without and with intravenous contrast. CONTRAST:  38mL GADAVIST GADOBUTROL 1 MMOL/ML IV SOLN COMPARISON:  MR head 05/20/2019 FINDINGS: Brain: Multiple small enhancing foci are consistent with brain metastases. Lesions are measured on series 16, postcontrast axial images. A 4 mm lesion is present in the left frontal operculum on image 75. No significant edema is associated. A 2.5 mm lesion is present in the right parietal lobe on image 82 without significant associated edema. A 4 mm lesion is present in the medial left parietal lobe on image 83 without significant associated edema. A 3.5 mm right cerebellar lesion is present on image 26 without significant associated edema No acute infarct or hemorrhage is present. The ventricles are of normal size. No significant extraaxial fluid collection is present. The internal auditory canals are within normal limits. The brainstem and cerebellum are within normal limits. Postcontrast images are otherwise within normal limits. Vascular: Flow is present in the major intracranial arteries. Skull and upper cervical spine: Focal T2 hyperintense area  along the left clivus is stable measuring 8 mm. Postcontrast enhancement is associated. No other osseous lesions are present. Craniocervical junction is within normal limits. Mild degenerative changes are noted in the upper cervical spine. Sinuses/Orbits: The paranasal sinuses and mastoid air cells are clear. The globes and orbits are within normal limits. IMPRESSION: 1. Four small enhancing foci consistent with brain metastases measuring up to 4 mm. No significant associated edema or mass effect. 2. Stable 8 mm lesion along the left clivus without significant associated edema. This likely represents a benign hemangioma. Slight hypodensity noted on the prior CT. Metastatic disease not excluded. Recommend continued attention to this area. Electronically Signed   By: San Morelle M.D.   On: 10/03/2020 06:33   NM PET Image Initial (PI) Skull Base To  Thigh  Result Date: 10/04/2020 CLINICAL DATA:  Initial treatment strategy for non-small-cell lung cancer. EXAM: NUCLEAR MEDICINE PET SKULL BASE TO THIGH TECHNIQUE: 14.1 mCi F-18 FDG was injected intravenously. Full-ring PET imaging was performed from the skull base to thigh after the radiotracer. CT data was obtained and used for attenuation correction and anatomic localization. Fasting blood glucose: 121 mg/dl COMPARISON:  CT chest dated September 04, 2020 FINDINGS: Mediastinal blood pool activity: SUV max 2.9 Liver activity: SUV max 3.7 NECK: No hypermetabolic lymph nodes in the neck. Incidental CT findings: none CHEST: Hypermetabolic left upper lobe mass measuring approximately 3.7 x 3.7 cm with an SUV max of 12.7, unchanged in size compared to prior CT. Hypermetabolic left paraaortic/AP window lymph node measuring 1.1 cm in short axis on series 4, image 61 with an SUV max of 5.2, unchanged in size. Hypermetabolic left hilar lymph node measuring approximately 1.1 cm in short axis on image 65 with an SUV max of 8.1, unchanged in size. Incidental CT findings:  none ABDOMEN/PELVIS: Cholelithiasis with no gallbladder wall thickening. Punctate nonobstructing left renal stone. Wall thickening of the sigmoid colon with adjacent fat stranding, hypermetabolic activity and associated sigmoid diverticula. Atherosclerotic disease of the abdominal aorta. Incidental CT findings: none SKELETON: Focal hypermetabolic activity of the left iliac bone adjacent to the SI joint with an SUV max of 10.8, a subtle lytic lesion is seen at this area on CT image 159. Incidental CT findings: none IMPRESSION: Left upper lobe mass with hypermetabolic activity, compatible with primary lung malignancy. Hypermetabolic left hilar and paraaortic/AP window lymph nodes, compatible with nodal metastatic disease. Focal hypermetabolic lesion of the left iliac bone with subtle associated lytic lesion seen on CT, concerning for osseous metastatic disease. Incidental findings compatible with acute uncomplicated diverticulitis. These results will be called to the ordering clinician or representative by the Radiologist Assistant, and communication documented in the PACS or [employer]. Electronically Signed   By: Yetta Glassman M.D.   On: 10/04/2020 16:17   DG Chest Port 1 View  Result Date: 09/14/2020 CLINICAL DATA:  History of left upper lobe mass status post bronchoscopy EXAM: PORTABLE CHEST 1 VIEW COMPARISON:  09/04/2020 FINDINGS: Cardiac shadow is at the upper limits of normal in size but accentuated by the portable technique. Left upper lobe mass lesion is again noted with fiducial marker in place. No pneumothorax is noted. No focal infiltrate or effusion is seen. No bony abnormality is noted. IMPRESSION: Status post bronchoscopy without evidence of pneumothorax. Electronically Signed   By: Inez Catalina M.D.   On: 09/14/2020 15:20   DG C-ARM BRONCHOSCOPY  Result Date: 09/14/2020 C-ARM BRONCHOSCOPY: Fluoroscopy was utilized by the requesting physician.  No radiographic interpretation.      ASSESSMENT/PLAN:  This is a very pleasant 60 year old African-American male with stage IV (T2b, N2, M1 C) non-small cell lung cancer, adenocarcinoma.  He presented with a left upper lobe lung mass in addition to left hilar and mediastinal lymphadenopathy.  He had also had a small left iliac osseous lesion and metastatic disease to the brain.  He was diagnosed in September 2022.  PD-L1 expression 10%.  [facility] does not have enough circulating tumor.   Dr. Julien Nordmann had a lengthly discussion with the patient today about his current condition and treatment options. Dr. Julien Nordmann discussed the MRI and PET scan results. This makes him a stage IV. Therefore, Dr. Julien Nordmann recommends switching treatment with systemic chemotherapy with carboplatin for an AUC of 5, Alimta 500 mg/m, and  Keytruda 200 mg IV every 3 weeks.  The patient is interested in proceeding with systemic chemotherapy.  He is expected to start his first dose of this treatment on 10/13/20.  We discussed the adverse side effects of treatment including but not limited to alopecia, myelosuppression, nausea and vomiting, peripheral neuropathy, liver or renal dysfunction as well as immunotherapy mediated adverse effects.   We will arrange for the patient to have a B12 injection while in the clinic today.     I sent prescriptions for 1 mg folic acid p.o. daily as well as Compazine 10 mg every 6 hours as needed for nausea.   The patient will follow-up in 2 weeks for a one-week follow-up visit after completing his first cycle of chemotherapy.  Regarding the incidental diverticulitis, the patient is asymptomatic except for some mild pain to palpation left lower quadrant.  Due to the patient's recurrent diverticulitis, immunocompromise state, and prior need for hospitalizations from diverticulitis, we will send a prescription for Flagyl 500 mg every 8 hours and Cipro 500 mg every 12 hours for 10 days to the patient's pharmacy.  He was advised to  avoid alcohol while undergoing treatment with flagyl.  He is well-appearing today.  No leukocytosis on labs.  He was advised to seek emergency room evaluation if he develops any abdominal pain, fevers, chills, blood in the stool, etc.   I will reach out to radiation oncology regarding the metastatic disease to the brain to discuss [facility] treatment.   The patient was advised to call immediately if she has any concerning symptoms in the interval. The patient voices understanding of current disease status and treatment options and is in agreement with the current care plan. All questions were answered. The patient knows to call the clinic with any problems, questions or concerns. We can certainly see the patient much sooner if necessary    Orders Placed This Encounter  Procedures   TSH    Standing Status:   Standing    Number of Occurrences:   12    Standing Expiration Date:   10/05/2021       Tobe Sos Dorina Ribaudo, PA-C 10/05/20  ADDENDUM: Hematology/Oncology Attending: I had a face-to-face encounter with the patient today.  I reviewed his record, lab, scan and recommended his care plan.  This is a very pleasant 60 years old anxious African-American male who was recently diagnosed with a stage IV (T2b, N2, M1 C) non-small cell lung cancer, adenocarcinoma presented with left upper lobe lung mass in addition to left hilar and mediastinal lymphadenopathy.  He was initially thought to have stage III lung cancer and was considered for a course of concurrent chemoradiation but unfortunately the PET scan and MRI of the brain to complete the staging work-up of his disease showed evidence of metastatic disease in the bone with a small left iliac osseous lesion in addition to at least 4 metastatic brain lesions. I had a lengthy discussion with the patient and his wife today about his current condition and treatment options.  I explained to the patient that we would have to cancel the course of  concurrent chemoradiation that was initially planned because of the new findings on the PET scan as well as the MRI of the brain. I recommended for the patient to see Dr. Lisbeth Renshaw for consideration of SRS treatment for the metastatic brain lesions. I also recommend for the patient changing his systemic treatment to a different regimen.  He now understands that he has incurable condition  and all the treatment will be of palliative nature. He was giving the option of palliative care versus palliative systemic chemotherapy with carboplatin for AUC of 5, Alimta 500 Mg/M2 and Keytruda 200 Mg IV every 3 weeks.  The molecular studies sent to [facility] did not have sufficient DNA for analysis but his PD-L1 expression was 10%.  We are still waiting for the tissue analysis to rule out any other underlying molecular actionable mutations if there is sufficient material for testing. I recommended for the patient to proceed with systemic chemotherapy for now until the remaining molecular studies are available. I discussed with the patient the adverse effect of the systemic therapy including but not limited to alopecia, myelosuppression, nausea and vomiting, peripheral neuropathy, liver or renal dysfunction as well as immunotherapy adverse effects. He will receive vitamin B12 injection today.  We will call his pharmacy with prescription for folic acid. He is expected to start the first cycle of his treatment next week. The patient and his wife agreed to the current plan. He will come back for follow-up visit in 2 weeks for evaluation and management of any adverse effect of his treatment. The patient was advised to call immediately if he has any concerning symptoms in the interval. The total time spent in the appointment was 40 minutes. Disclaimer: This note was dictated with voice recognition software. Similar sounding words can inadvertently be transcribed and may be missed upon review. Eilleen Kempf,  [name] 10/05/20

## 2020-10-05 NOTE — Telephone Encounter (Signed)
Message forwarded to Dr. Lamonte Sakai.

## 2020-10-05 NOTE — Progress Notes (Signed)
DISCONTINUE ON PATHWAY REGIMEN - Non-Small Cell Lung     Administer weekly:     Paclitaxel      Carboplatin   **Always confirm dose/schedule in your pharmacy ordering system**  REASON: Other Reason PRIOR TREATMENT: [id number]: Carboplatin AUC=2 + Paclitaxel 45 mg/m2 Weekly During Radiation TREATMENT RESPONSE: Unable to Evaluate  START OFF PATHWAY REGIMEN - Non-Small Cell Lung   [id number]:Pembrolizumab 200 mg  IV D1 + Pemetrexed 500 mg/m2 IV D1 + Carboplatin AUC=5 IV D1 q21 Days:   A cycle is every 21 days:     Pembrolizumab      Pemetrexed      Carboplatin   **Always confirm dose/schedule in your pharmacy ordering system**  Patient Characteristics: Stage IV Metastatic, Nonsquamous, Molecular Analysis Completed, Molecular Alteration Present and Targeted Therapy Exhausted OR EGFR Exon 20+ or KRAS G12C+ or HER2+ Present and No Prior Chemo/Immunotherapy OR No Alteration Present, Initial  Chemotherapy/Immunotherapy, PS = 0, 1, No Alteration Present, Did Not Order Molecular Analysis/Quantity Not Sufficient for Molecular Analysis Therapeutic Status: Stage IV Metastatic Histology: Nonsquamous Cell Broad Molecular Profiling Status: Engineer, manufacturing Analysis Results: No Alteration Present ECOG Performance Status: 1 Chemotherapy/Immunotherapy Line of Therapy: Initial Chemotherapy/Immunotherapy EGFR Exons 18-21 Mutation Testing Status: Quantity Not Sufficient ALK Fusion/Rearrangement Testing Status: Quantity Not Sufficient BRAF V600 Mutation Testing Status: Quantity Not Sufficient KRAS G12C Mutation Testing Status: Quantity Not Sufficient MET Exon 14 Mutation Testing Status: Quantity Not Sufficient RET Fusion/Rearrangement Testing Status: Quantity Not Sufficient HER2 Mutation Testing Status: Quantity Not Sufficient NTRK Fusion/Rearrangement Testing Status: Quantity Not Sufficient ROS1 Fusion/Rearrangement Testing Status: Quantity Not Sufficient Intent of  Therapy: Non-Curative / Palliative Intent, Discussed with Patient

## 2020-10-05 NOTE — Telephone Encounter (Signed)
He was recently in the hospital w acute on chronic diverticular disease. Hopefully not active at this time. He has an OV with Oncology today.

## 2020-10-06 ENCOUNTER — Other Ambulatory Visit: Payer: Self-pay | Admitting: Radiation Therapy

## 2020-10-06 ENCOUNTER — Telehealth: Payer: Self-pay | Admitting: Radiation Oncology

## 2020-10-06 DIAGNOSIS — C3492 Malignant neoplasm of unspecified part of left bronchus or lung: Secondary | ICD-10-CM

## 2020-10-06 NOTE — Telephone Encounter (Signed)
I called the patient to review the findings from his recent PET and MRI brain. He's getting ready to start chemo immunotherapy. He is not symptomatic from his iliac disease. We discussed single fraction SRS, the need for 3T MRI for planning purposes and to make sure he's a candidate for this therapy. He would need to meet with neurosurgery as well and we will coordinate this through our special procedures navigator. We discussed the risks, benefits, short, and long term effects of radiotherapy, as well as the curative intent, and the patient is interested in proceeding. Dr. Lisbeth Renshaw discusses the delivery and logistics of radiotherapy and anticipates a course of a single fraction of radiotherapy. We also discussed social work and spiritual services referral as the patient is struggling with feeling overwhelmed by his diagnosis. I also reached out to his brother Mallie Mussel who's an oncology pharmaceutical rep and we were able to review the recommendations as well.

## 2020-10-07 ENCOUNTER — Encounter: Payer: Self-pay | Admitting: General Practice

## 2020-10-07 ENCOUNTER — Encounter (HOSPITAL_COMMUNITY): Payer: Self-pay

## 2020-10-07 NOTE — Progress Notes (Signed)
[facility] CSW Progress Notes  Called patient per urgent referral.  He is at work and requested a call tomorrow afternoon.  CSW made phone appointment w him.  Edwyna Shell, LCSW Clinical Social Worker Phone:  [phone]

## 2020-10-08 ENCOUNTER — Other Ambulatory Visit: Payer: Self-pay | Admitting: Internal Medicine

## 2020-10-08 ENCOUNTER — Other Ambulatory Visit: Payer: Self-pay

## 2020-10-08 ENCOUNTER — Ambulatory Visit
Admission: RE | Admit: 2020-10-08 | Discharge: 2020-10-08 | Disposition: A | Discharge status: Home / Self Care | Payer: Self-pay | Source: Ambulatory Visit | Attending: Radiation Oncology | Admitting: Radiation Oncology

## 2020-10-08 ENCOUNTER — Inpatient Hospital Stay: Payer: Self-pay | Admitting: General Practice

## 2020-10-08 DIAGNOSIS — C7931 Secondary malignant neoplasm of brain: Secondary | ICD-10-CM

## 2020-10-08 DIAGNOSIS — C3492 Malignant neoplasm of unspecified part of left bronchus or lung: Secondary | ICD-10-CM

## 2020-10-08 MED ORDER — GADOBENATE DIMEGLUMINE 529 MG/ML IV SOLN
20.0000 mL | Freq: Once | INTRAVENOUS | Status: AC | PRN
  Administered 2020-10-08: 20 mL via INTRAVENOUS

## 2020-10-08 NOTE — Progress Notes (Signed)
[facility] CSW Progress Notes  Called patient at scheduled time.  He was scheduled for 1 PM "brain scan".  "Every time I turn around there is another procedure."  Awoke in late August w severe stomach pain, went to hospital as he had diverticulitis.  Found blood clots on lung, had CT scan, was told "you dont have blood clots on your lung, you have a mass on your lung."  "That blew me out of the water."  Cannot get the trauma of hearing he has cancer "out of my head."  Feels like he is just doing one thing after another, perceives that he has lost control of his life.   Diagnosis has been traumatic and stressful, unexpected with multiple implications.  He is grappling with multiple conflicting emotions as well as wanting to continue to live "normal" life at [employer].  These other setting do provide a way to detach from the unsettling reality of a cancer diagnosis.  Discussed ways to manage anxiety during often traumatic time of early diagnosis and treatment initiation.  Discussed ways to compartmentalize emotions by scheduling time to write/express feelings as well as using appropriate distracting activities.  Discussed need to find social support including peer mentoring. Referred to [facility] for peer mentor.  Will follow up w patient in a week by phone.  Edwyna Shell, LCSW Clinical Social Worker Phone:  [phone]

## 2020-10-09 ENCOUNTER — Encounter: Payer: Self-pay | Admitting: Internal Medicine

## 2020-10-09 MED FILL — Fosaprepitant Dimeglumine For IV Infusion 150 MG (Base Eq): INTRAVENOUS | Qty: 5 | Status: AC

## 2020-10-09 MED FILL — Dexamethasone Sodium Phosphate Inj 100 MG/10ML: INTRAMUSCULAR | Qty: 1 | Status: AC

## 2020-10-12 ENCOUNTER — Inpatient Hospital Stay: Payer: Self-pay

## 2020-10-12 ENCOUNTER — Encounter: Payer: Self-pay | Admitting: General Practice

## 2020-10-12 ENCOUNTER — Telehealth: Payer: Self-pay | Admitting: Radiation Oncology

## 2020-10-12 ENCOUNTER — Other Ambulatory Visit: Payer: Self-pay

## 2020-10-12 ENCOUNTER — Ambulatory Visit: Payer: Self-pay | Admitting: Physician Assistant

## 2020-10-12 VITALS — BP 178/94 | HR 67 | Temp 98.5°F | Resp 18

## 2020-10-12 DIAGNOSIS — C3412 Malignant neoplasm of upper lobe, left bronchus or lung: Secondary | ICD-10-CM

## 2020-10-12 DIAGNOSIS — C3492 Malignant neoplasm of unspecified part of left bronchus or lung: Secondary | ICD-10-CM

## 2020-10-12 LAB — CMP (CANCER CENTER ONLY)
ALT: 21 U/L (ref 0–44)
AST: 17 U/L (ref 15–41)
Albumin: 3.3 g/dL — ABNORMAL LOW (ref 3.5–5.0)
Alkaline Phosphatase: 93 U/L (ref 38–126)
Anion gap: 9 (ref 5–15)
BUN: 16 mg/dL (ref 6–20)
CO2: 22 mmol/L (ref 22–32)
Calcium: 8.7 mg/dL — ABNORMAL LOW (ref 8.9–10.3)
Chloride: 112 mmol/L — ABNORMAL HIGH (ref 98–111)
Creatinine: 1.07 mg/dL (ref 0.61–1.24)
GFR, Estimated: >60 mL/min (ref >60)
Glucose, Bld: 111 mg/dL — ABNORMAL HIGH (ref 70–99)
Potassium: 4 mmol/L (ref 3.5–5.1)
Sodium: 143 mmol/L (ref 135–145)
Total Bilirubin: <0.2 mg/dL — ABNORMAL LOW (ref 0.3–1.2)
Total Protein: 6.6 g/dL (ref 6.5–8.1)

## 2020-10-12 LAB — CBC WITH DIFFERENTIAL (CANCER CENTER ONLY)
Abs Immature Granulocytes: 0.07 10*3/uL (ref 0.00–0.07)
Basophils Absolute: 0 10*3/uL (ref 0.0–0.1)
Basophils Relative: 1 %
Eosinophils Absolute: 0.5 10*3/uL (ref 0.0–0.5)
Eosinophils Relative: 6 %
HCT: 41 % (ref 39.0–52.0)
Hemoglobin: 13.2 g/dL (ref 13.0–17.0)
Immature Granulocytes: 1 %
Lymphocytes Relative: 22 %
Lymphs Abs: 1.8 10*3/uL (ref 0.7–4.0)
MCH: 28.4 pg (ref 26.0–34.0)
MCHC: 32.2 g/dL (ref 30.0–36.0)
MCV: 88.2 fL (ref 80.0–100.0)
Monocytes Absolute: 0.8 10*3/uL (ref 0.1–1.0)
Monocytes Relative: 10 %
Neutro Abs: 5 10*3/uL (ref 1.7–7.7)
Neutrophils Relative %: 60 %
Platelet Count: 215 10*3/uL (ref 150–400)
RBC: 4.65 MIL/uL (ref 4.22–5.81)
RDW: 13.5 % (ref 11.5–15.5)
WBC Count: 8.2 10*3/uL (ref 4.0–10.5)
nRBC: 0 % (ref 0.0–0.2)

## 2020-10-12 LAB — TSH: TSH: 1.081 u[IU]/mL (ref 0.320–4.118)

## 2020-10-12 MED ORDER — SODIUM CHLORIDE 0.9 % IV SOLN
10.0000 mg | Freq: Once | INTRAVENOUS | Status: AC
  Administered 2020-10-12: 10 mg via INTRAVENOUS
  Filled 2020-10-12: qty 10

## 2020-10-12 MED ORDER — SODIUM CHLORIDE 0.9 % IV SOLN
150.0000 mg | Freq: Once | INTRAVENOUS | Status: AC
  Administered 2020-10-12: 150 mg via INTRAVENOUS
  Filled 2020-10-12: qty 150

## 2020-10-12 MED ORDER — SODIUM CHLORIDE 0.9 % IV SOLN: Freq: Once | INTRAVENOUS | Status: AC

## 2020-10-12 MED ORDER — PALONOSETRON HCL INJECTION 0.25 MG/5ML
0.2500 mg | Freq: Once | INTRAVENOUS | Status: AC
  Administered 2020-10-12: 0.25 mg via INTRAVENOUS
  Filled 2020-10-12: qty 5

## 2020-10-12 MED ORDER — SODIUM CHLORIDE 0.9 % IV SOLN
500.0000 mg/m2 | Freq: Once | INTRAVENOUS | Status: AC
  Administered 2020-10-12: 1300 mg via INTRAVENOUS
  Filled 2020-10-12: qty 40

## 2020-10-12 MED ORDER — SODIUM CHLORIDE 0.9 % IV SOLN
750.0000 mg | Freq: Once | INTRAVENOUS | Status: AC
  Administered 2020-10-12: 750 mg via INTRAVENOUS
  Filled 2020-10-12: qty 75

## 2020-10-12 NOTE — Progress Notes (Signed)
..  Patient is receiving Replacement Medication. Medication: Alimta (pemetrexed) Manufacture: [employer] Approval Dates: Approved from 10/12/2020 until 10/12/2021. ID: [id number] Reason: Self Pay First DOS: 10/12/2020. Marland KitchenJuan Quam, CPhT IV Drug Replacement Specialist [facility] Phone: [phone]

## 2020-10-12 NOTE — Patient Instructions (Addendum)
[facility] ONCOLOGY  Discharge Instructions: Thank you for choosing [facility] to provide your oncology and hematology care.   If you have a lab appointment with the [facility], please go directly to the [facility] and check in at the registration area.   Wear comfortable clothing and clothing appropriate for easy access to any Portacath or PICC line.   We strive to give you quality time with your provider. You may need to reschedule your appointment if you arrive late (15 or more minutes).  Arriving late affects you and other patients whose appointments are after yours.  Also, if you miss three or more appointments without notifying the office, you may be dismissed from the clinic at the provider's discretion.      For prescription refill requests, have your pharmacy contact our office and allow 72 hours for refills to be completed.    Today you received the following chemotherapy and/or immunotherapy agents Pemetrexed (Alimta)  and Carboplatin      To help prevent nausea and vomiting after your treatment, we encourage you to take your nausea medication as directed.  BELOW ARE SYMPTOMS THAT SHOULD BE REPORTED IMMEDIATELY: *FEVER GREATER THAN 100.4 F (38 C) OR HIGHER *CHILLS OR SWEATING *NAUSEA AND VOMITING THAT IS NOT CONTROLLED WITH YOUR NAUSEA MEDICATION *UNUSUAL SHORTNESS OF BREATH *UNUSUAL BRUISING OR BLEEDING *URINARY PROBLEMS (pain or burning when urinating, or frequent urination) *BOWEL PROBLEMS (unusual diarrhea, constipation, pain near the anus) TENDERNESS IN MOUTH AND THROAT WITH OR WITHOUT PRESENCE OF ULCERS (sore throat, sores in mouth, or a toothache) UNUSUAL RASH, SWELLING OR PAIN  UNUSUAL VAGINAL DISCHARGE OR ITCHING   Items with * indicate a potential emergency and should be followed up as soon as possible or go to the Emergency Department if any problems should occur.  Please show the CHEMOTHERAPY ALERT CARD or IMMUNOTHERAPY  ALERT CARD at check-in to the Emergency Department and triage nurse.  Should you have questions after your visit or need to cancel or reschedule your appointment, please contact [facility]  Dept: [phone]  and follow the prompts.  Office hours are 8:00 a.m. to 4:30 p.m. Monday - Friday. Please note that voicemails left after 4:00 p.m. may not be returned until the following business day.  We are closed weekends and major holidays. You have access to a nurse at all times for urgent questions. Please call the main number to the clinic Dept: [phone] and follow the prompts.   For any non-urgent questions, you may also contact your provider using MyChart. We now offer e-Visits for anyone 62 and older to request care online for non-urgent symptoms. For details visit [email or web address].   Also download the MyChart app! Go to the app store, search "MyChart", open the app, select [facility], and log in with your MyChart username and password.  Due to Covid, a mask is required upon entering the hospital/clinic. If you do not have a mask, one will be given to you upon arrival. For doctor visits, patients may have 1 support person aged 26 or older with them. For treatment visits, patients cannot have anyone with them due to current Covid guidelines and our immunocompromised population.   Pemetrexed (Alimta) injection What is this medication? PEMETREXED (PEM e TREX ed) is a chemotherapy drug used to treat lung cancers like non-small cell lung cancer and mesothelioma. It may also be used to treat other cancers. This medicine may be used for other purposes;  ask your health care provider or pharmacist if you have questions. COMMON BRAND NAME(S): Alimta, PEMFEXY What should I tell my care team before I take this medication? They need to know if you have any of these conditions: infection (especially a virus infection such as chickenpox, cold sores, or herpes) kidney  disease low blood counts, like low white cell, platelet, or red cell counts lung or breathing disease, like asthma radiation therapy an unusual or allergic reaction to pemetrexed, other medicines, foods, dyes, or preservative pregnant or trying to get pregnant breast-feeding How should I use this medication? This drug is given as an infusion into a vein. It is administered in a hospital or clinic by a specially trained health care professional. Talk to your pediatrician regarding the use of this medicine in children. Special care may be needed. Overdosage: If you think you have taken too much of this medicine contact a poison control center or emergency room at once. NOTE: This medicine is only for you. Do not share this medicine with others. What if I miss a dose? It is important not to miss your dose. Call your doctor or health care professional if you are unable to keep an appointment. What may interact with this medication? This medicine may interact with the following medications: Ibuprofen This list may not describe all possible interactions. Give your health care provider a list of all the medicines, herbs, non-prescription drugs, or dietary supplements you use. Also tell them if you smoke, drink alcohol, or use illegal drugs. Some items may interact with your medicine. What should I watch for while using this medication? Visit your doctor for checks on your progress. This drug may make you feel generally unwell. This is not uncommon, as chemotherapy can affect healthy cells as well as cancer cells. Report any side effects. Continue your course of treatment even though you feel ill unless your doctor tells you to stop. In some cases, you may be given additional medicines to help with side effects. Follow all directions for their use. Call your doctor or health care professional for advice if you get a fever, chills or sore throat, or other symptoms of a cold or flu. Do not treat yourself.  This drug decreases your body's ability to fight infections. Try to avoid being around people who are sick. This medicine may increase your risk to bruise or bleed. Call your doctor or health care professional if you notice any unusual bleeding. Be careful brushing and flossing your teeth or using a toothpick because you may get an infection or bleed more easily. If you have any dental work done, tell your dentist you are receiving this medicine. Avoid taking products that contain aspirin, acetaminophen, ibuprofen, naproxen, or ketoprofen unless instructed by your doctor. These medicines may hide a fever. Call your doctor or health care professional if you get diarrhea or mouth sores. Do not treat yourself. To protect your kidneys, drink water or other fluids as directed while you are taking this medicine. Do not become pregnant while taking this medicine or for 6 months after stopping it. Women should inform their doctor if they wish to become pregnant or think they might be pregnant. Men should not father a child while taking this medicine and for 3 months after stopping it. This may interfere with the ability to father a child. You should talk to your doctor or health care professional if you are concerned about your fertility. There is a potential for serious side effects to an  unborn child. Talk to your health care professional or pharmacist for more information. Do not breast-feed an infant while taking this medicine or for 1 week after stopping it. What side effects may I notice from receiving this medication? Side effects that you should report to your doctor or health care professional as soon as possible: allergic reactions like skin rash, itching or hives, swelling of the face, lips, or tongue breathing problems redness, blistering, peeling or loosening of the skin, including inside the mouth signs and symptoms of bleeding such as bloody or black, tarry stools; red or dark-brown urine;  spitting up blood or brown material that looks like coffee grounds; red spots on the skin; unusual bruising or bleeding from the eye, gums, or nose signs and symptoms of infection like fever or chills; cough; sore throat; pain or trouble passing urine signs and symptoms of kidney injury like trouble passing urine or change in the amount of urine signs and symptoms of liver injury like dark yellow or brown urine; general ill feeling or flu-like symptoms; light-colored stools; loss of appetite; nausea; right upper belly pain; unusually weak or tired; yellowing of the eyes or skin Side effects that usually do not require medical attention (report to your doctor or health care professional if they continue or are bothersome): constipation mouth sores nausea, vomiting unusually weak or tired This list may not describe all possible side effects. Call your doctor for medical advice about side effects. You may report side effects to FDA at 1-800-FDA-1088. Where should I keep my medication? This drug is given in a hospital or clinic and will not be stored at home. NOTE: This sheet is a summary. It may not cover all possible information. If you have questions about this medicine, talk to your doctor, pharmacist, or health care provider.  2022 Elsevier/Gold Standard (2017-02-08 16:11:33)  Carboplatin injection What is this medication? CARBOPLATIN (KAR boe pla tin) is a chemotherapy drug. It targets fast dividing cells, like cancer cells, and causes these cells to die. This medicine is used to treat ovarian cancer and many other cancers. This medicine may be used for other purposes; ask your health care provider or pharmacist if you have questions. COMMON BRAND NAME(S): Paraplatin What should I tell my care team before I take this medication? They need to know if you have any of these conditions: blood disorders hearing problems kidney disease recent or ongoing radiation therapy an unusual or allergic  reaction to carboplatin, cisplatin, other chemotherapy, other medicines, foods, dyes, or preservatives pregnant or trying to get pregnant breast-feeding How should I use this medication? This drug is usually given as an infusion into a vein. It is administered in a hospital or clinic by a specially trained health care professional. Talk to your pediatrician regarding the use of this medicine in children. Special care may be needed. Overdosage: If you think you have taken too much of this medicine contact a poison control center or emergency room at once. NOTE: This medicine is only for you. Do not share this medicine with others. What if I miss a dose? It is important not to miss a dose. Call your doctor or health care professional if you are unable to keep an appointment. What may interact with this medication? medicines for seizures medicines to increase blood counts like filgrastim, pegfilgrastim, sargramostim some antibiotics like amikacin, gentamicin, neomycin, streptomycin, tobramycin vaccines Talk to your doctor or health care professional before taking any of these medicines: acetaminophen aspirin ibuprofen ketoprofen naproxen This  list may not describe all possible interactions. Give your health care provider a list of all the medicines, herbs, non-prescription drugs, or dietary supplements you use. Also tell them if you smoke, drink alcohol, or use illegal drugs. Some items may interact with your medicine. What should I watch for while using this medication? Your condition will be monitored carefully while you are receiving this medicine. You will need important blood work done while you are taking this medicine. This drug may make you feel generally unwell. This is not uncommon, as chemotherapy can affect healthy cells as well as cancer cells. Report any side effects. Continue your course of treatment even though you feel ill unless your doctor tells you to stop. In some cases,  you may be given additional medicines to help with side effects. Follow all directions for their use. Call your doctor or health care professional for advice if you get a fever, chills or sore throat, or other symptoms of a cold or flu. Do not treat yourself. This drug decreases your body's ability to fight infections. Try to avoid being around people who are sick. This medicine may increase your risk to bruise or bleed. Call your doctor or health care professional if you notice any unusual bleeding. Be careful brushing and flossing your teeth or using a toothpick because you may get an infection or bleed more easily. If you have any dental work done, tell your dentist you are receiving this medicine. Avoid taking products that contain aspirin, acetaminophen, ibuprofen, naproxen, or ketoprofen unless instructed by your doctor. These medicines may hide a fever. Do not become pregnant while taking this medicine. Women should inform their doctor if they wish to become pregnant or think they might be pregnant. There is a potential for serious side effects to an unborn child. Talk to your health care professional or pharmacist for more information. Do not breast-feed an infant while taking this medicine. What side effects may I notice from receiving this medication? Side effects that you should report to your doctor or health care professional as soon as possible: allergic reactions like skin rash, itching or hives, swelling of the face, lips, or tongue signs of infection - fever or chills, cough, sore throat, pain or difficulty passing urine signs of decreased platelets or bleeding - bruising, pinpoint red spots on the skin, black, tarry stools, nosebleeds signs of decreased red blood cells - unusually weak or tired, fainting spells, lightheadedness breathing problems changes in hearing changes in vision chest pain high blood pressure low blood counts - This drug may decrease the number of white blood  cells, red blood cells and platelets. You may be at increased risk for infections and bleeding. nausea and vomiting pain, swelling, redness or irritation at the injection site pain, tingling, numbness in the hands or feet problems with balance, talking, walking trouble passing urine or change in the amount of urine Side effects that usually do not require medical attention (report to your doctor or health care professional if they continue or are bothersome): hair loss loss of appetite metallic taste in the mouth or changes in taste This list may not describe all possible side effects. Call your doctor for medical advice about side effects. You may report side effects to FDA at 1-800-FDA-1088. Where should I keep my medication? This drug is given in a hospital or clinic and will not be stored at home. NOTE: This sheet is a summary. It may not cover all possible information. If you have questions  about this medicine, talk to your doctor, pharmacist, or health care provider.  2022 Elsevier/Gold Standard (2007-03-27 14:38:05)

## 2020-10-12 NOTE — Progress Notes (Signed)
[facility] Spiritual Care Note  Referred by Webb Silversmith Cunningham/LCSW for spiritual and emotional support. Met Raymond [name] in infusion, introducing Spiritual Care and inviting him to [facility], which I facilitate.  Raymond [name] describes himself as a very spiritual person and leans on his faith for meaning-making and coping. 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  Raymond [name] was very appreciative of visit and packet of information about [facility] team and support programming. Provided empathic listening, emotional support, normalization of feelings, and affirmation of strengths. He plans to attend Lung Cancer Support Group, for which I registered him at his request.  We plan to follow up at his next treatment, and he knows how to reach out in the interim as needed/desired.     [place], [place], [facility] Pager [phone] Voicemail [phone]

## 2020-10-12 NOTE — Telephone Encounter (Signed)
I called and left a message for the patient's wife to call me back at her convenience to review his MRI but that our plans were to still proceed as outlined last week.

## 2020-10-13 ENCOUNTER — Ambulatory Visit
Admission: RE | Admit: 2020-10-13 | Discharge: 2020-10-13 | Disposition: A | Discharge status: Home / Self Care | Payer: Self-pay | Source: Ambulatory Visit | Attending: Radiation Oncology | Admitting: Radiation Oncology

## 2020-10-13 DIAGNOSIS — C7931 Secondary malignant neoplasm of brain: Secondary | ICD-10-CM | POA: Insufficient documentation

## 2020-10-13 DIAGNOSIS — Z510 Encounter for antineoplastic radiation therapy: Secondary | ICD-10-CM | POA: Insufficient documentation

## 2020-10-13 DIAGNOSIS — C3492 Malignant neoplasm of unspecified part of left bronchus or lung: Secondary | ICD-10-CM

## 2020-10-13 DIAGNOSIS — C3412 Malignant neoplasm of upper lobe, left bronchus or lung: Secondary | ICD-10-CM | POA: Insufficient documentation

## 2020-10-13 MED ORDER — SODIUM CHLORIDE 0.9% FLUSH
10.0000 mL | Freq: Once | INTRAVENOUS | Status: AC
  Administered 2020-10-13: 10 mL via INTRAVENOUS

## 2020-10-14 NOTE — Progress Notes (Signed)
Raymond [name] OFFICE PROGRESS NOTE  Benito Mccreedy, MD [address] [place] [address]  DIAGNOSIS: Stage IV (T2b, N2, M1c) non-small cell lung cancer, adenocarcinoma presented with left upper lobe lung mass in addition to left hilar and mediastinal lymphadenopathy diagnosed in September 2022. He also has metastatic disease to the brain.   Molecular Studies: No actionable mutations    PRIOR THERAPY: None  CURRENT THERAPY: 1) Carboplatin for an AUC of 5, Alimta 500 mg per metered squared, Keytruda 200 mg IV every 3 weeks. First dose on 10/12/20. Status post 1 cycle 2) SRS to the metastatic brain lesions under the care of Dr. Lisbeth Renshaw Scheduled for 10/21/20.   INTERVAL HISTORY: Raymond [name] 60 y.o. male returns for a follow up visit. The patient was recently diagnosed with stage IV lung cancer. He is scheduled to undergo SRS to the subcentimeter metastatic brain lesions on 10/21/20. The patient is currently undergoing palliative systemic chemotherapy/immunotherapy. He underwent his first cycle of treatment last week and tolerated it fair. He has been following closely with the chaplain and the social worker due to emotional difficulties and having a hard time adjusting to the new diagnosis and feelings of loss of control. He states he has a good support system when he was offered information to the lung cancer support group.   His main concerns today are related to taste alterations, trouble sleeping, and decreased appetite. He has thrush on exam.   He had a headache in the frontal sinus area of his forehead last week for a few days. He took tylenol which helped some. No headaches today.   He also notes some epigastric discomfort that he describes as an "icky" feeling which will last 10-20 minutes before he eats before resolving. His compazine does not help. He denies nausea or vomiting. Tylenol does not help his pain. He sometimes takes Prilosec but regularly  and takes it PRN if he has heartburn. He does not have any pain at this time. He states he has a history of stomach ulcers. He had diverticulitis recently but cannot recall who his gastroenterologist is. Denies diarrhea or constipation. His last bowel movement was last night and was normal color and consistency. He states the folds of his buttock are moist and on sometimes will itch and have spots of blood if he puts toilet paper back there.   Overall he denies any fever, chills, night sweats, or unexplained weight loss.  He denies any chest pain, shortness of breath, cough, or hemoptysis.  The patient here today for evaluation and repeat blood work and for 1 week follow-up visit to manage any adverse effects of treatment.   MEDICAL HISTORY: Past Medical History:  Diagnosis Date   Arthritis    Baker's cyst    right   Diverticulitis    GERD (gastroesophageal reflux disease)    Heart attack ([place]) 01/04/2004   History of hiatal hernia    Hypertension    Malignant neoplasm of upper lobe of left lung ([place]) 09/24/2020   Pneumonia    as a child   Sleep apnea    no longer on Cpap    ALLERGIES:  is allergic to benadryl [diphenhydramine].  MEDICATIONS:  Current Outpatient Medications  Medication Sig Dispense Refill   acetaminophen (TYLENOL) 650 MG CR tablet Take 650 mg by mouth every 8 (eight) hours as needed for pain.     ciprofloxacin (CIPRO) 500 MG tablet Take 1 tablet (500 mg total) by mouth  2 (two) times daily. 20 tablet 0   etodolac (LODINE) 500 MG tablet Take 500 mg by mouth 2 (two) times daily.     folic acid (FOLVITE) 1 MG tablet Take 1 tablet (1 mg total) by mouth daily. 30 tablet 2   gabapentin (NEURONTIN) 600 MG tablet Take 600 mg by mouth at bedtime.     losartan-hydrochlorothiazide (HYZAAR) 50-12.5 MG tablet Take 1 tablet by mouth daily.     metroNIDAZOLE (FLAGYL) 500 MG tablet Take 1 tablet (500 mg total) by mouth 3 (three) times daily. 30 tablet 0   Misc Natural Products (OSTEO  BI-FLEX ADV JOINT SHIELD) TABS Take 1 tablet by mouth daily.     MULTIPLE VITAMIN PO Take 1 tablet by mouth daily.     omeprazole (PRILOSEC) 10 MG capsule Take 10 mg by mouth as needed.     oxyCODONE-acetaminophen (PERCOCET) 5-325 MG tablet Take 1-2 tablets by mouth every 4 (four) hours as needed for severe pain. 15 tablet 0   Probiotic Product (PROBIOTIC DAILY PO) Take 1 capsule by mouth daily.     prochlorperazine (COMPAZINE) 10 MG tablet Take 1 tablet (10 mg total) by mouth every 6 (six) hours as needed for nausea or vomiting. 30 tablet 0   Wheat Dextrin (BENEFIBER DRINK MIX) PACK Take by mouth.     fluconazole (DIFLUCAN) 100 MG tablet Take 1 tablet (100 mg total) by mouth daily. 10 tablet 0   mirtazapine (REMERON) 15 MG tablet Take 1 tablet (15 mg total) by mouth at bedtime. 30 tablet 2   No current facility-administered medications for this visit.    SURGICAL HISTORY:  Past Surgical History:  Procedure Laterality Date   BRONCHIAL BIOPSY  09/14/2020   Procedure: BRONCHIAL BIOPSIES;  Surgeon: Collene Gobble, MD;  Location: [facility] ENDOSCOPY;  Service: Cardiopulmonary;;   BRONCHIAL BRUSHINGS  09/14/2020   Procedure: BRONCHIAL BRUSHINGS;  Surgeon: Collene Gobble, MD;  Location: [place];  Service: Cardiopulmonary;;   BRONCHIAL NEEDLE ASPIRATION BIOPSY  09/14/2020   Procedure: BRONCHIAL NEEDLE ASPIRATION BIOPSIES;  Surgeon: Collene Gobble, MD;  Location: [place];  Service: Cardiopulmonary;;   CARDIAC CATHETERIZATION     CARPAL TUNNEL RELEASE     right   FIDUCIAL MARKER PLACEMENT  09/14/2020   Procedure: FIDUCIAL MARKER PLACEMENT;  Surgeon: Collene Gobble, MD;  Location: [place];  Service: Cardiopulmonary;;   HAND SURGERY     left   KNEE SURGERY     left   VIDEO BRONCHOSCOPY N/A 09/14/2020   Procedure: ROBOTIC VIDEO BRONCHOSCOPY WITH FLUORO;  Surgeon: Collene Gobble, MD;  Location: [place];  Service: Cardiopulmonary;  Laterality: N/A;   VIDEO BRONCHOSCOPY WITH  ENDOBRONCHIAL ULTRASOUND N/A 09/14/2020   Procedure: VIDEO BRONCHOSCOPY WITH ENDOBRONCHIAL ULTRASOUND;  Surgeon: Collene Gobble, MD;  Location: [place];  Service: Cardiopulmonary;  Laterality: N/A;   VIDEO BRONCHOSCOPY WITH RADIAL ENDOBRONCHIAL ULTRASOUND  09/14/2020   Procedure: VIDEO BRONCHOSCOPY WITH RADIAL ENDOBRONCHIAL ULTRASOUND;  Surgeon: Collene Gobble, MD;  Location: [facility] ENDOSCOPY;  Service: Cardiopulmonary;;    REVIEW OF SYSTEMS:   Review of Systems  Constitutional: Positive for fatigue and appetite change.  Negative for chills, fever and unexpected weight change.  HENT: Positive for taste alterations.  Negative for mouth sores, nosebleeds, sore throat and trouble swallowing.   Eyes: Negative for eye problems and icterus.  Respiratory: Negative for cough, hemoptysis, shortness of breath and wheezing.   Cardiovascular: Negative for chest pain and leg swelling.  Gastrointestinal: Positive for intermittent  epigastric discomfort.  Negative for constipation, diarrhea, nausea and vomiting.  Genitourinary: Negative for bladder incontinence, difficulty urinating, dysuria, frequency and hematuria.   Musculoskeletal: Negative for back pain, gait problem, neck pain and neck stiffness.  Skin: Positive for itching in gluteal fold. Neurological: Positive for headaches last week.  Negative for dizziness, extremity weakness, gait problem, light-headedness and seizures.  Hematological: Negative for adenopathy. Does not bruise/bleed easily.  Psychiatric/Behavioral: Negative for confusion, depression and sleep disturbance. The patient is not nervous/anxious.     PHYSICAL EXAMINATION:  Blood pressure 140/82, pulse 83, temperature 99.5 F (37.5 C), resp. rate 20, weight 283 lb 4.8 oz (128.5 kg), SpO2 100 %.  ECOG PERFORMANCE STATUS: 1  Physical Exam  Constitutional: Oriented to person, place, and time and well-developed, well-nourished, and in no distress.   HENT:  Head: Normocephalic and  atraumatic.  Mouth/Throat: Oropharynx is clear and moist. No oropharyngeal exudate.  Eyes: Conjunctivae are normal. Right eye exhibits no discharge. Left eye exhibits no discharge. No scleral icterus.  Neck: Normal range of motion. Neck supple.  Cardiovascular: Normal rate, regular rhythm, normal heart sounds and intact distal pulses.   Pulmonary/Chest: Effort normal and breath sounds normal. No respiratory distress. No wheezes. No rales.  Abdominal: Soft. Bowel sounds are normal. Exhibits no distension and no mass. There is no tenderness.  Musculoskeletal: Normal range of motion. Exhibits no edema.  Lymphadenopathy:    No cervical adenopathy.  Neurological: Alert and oriented to person, place, and time. Exhibits normal muscle tone. Gait normal. Coordination normal.  Skin: Skin is warm and dry. No rash noted. Not diaphoretic. No erythema. No pallor.  Psychiatric: Mood, memory and judgment normal.  Vitals reviewed.  LABORATORY DATA: Lab Results  Component Value Date   WBC 4.7 10/19/2020   HGB 13.1 10/19/2020   HCT 40.4 10/19/2020   MCV 87.3 10/19/2020   PLT 177 10/19/2020      Chemistry      Component Value Date/Time   NA 144 10/19/2020 0924   K 4.2 10/19/2020 0924   CL 111 10/19/2020 0924   CO2 25 10/19/2020 0924   BUN 19 10/19/2020 0924   CREATININE 0.90 10/19/2020 0924      Component Value Date/Time   CALCIUM 9.0 10/19/2020 0924   ALKPHOS 97 10/19/2020 0924   AST 13 (L) 10/19/2020 0924   ALT 19 10/19/2020 0924   BILITOT 0.5 10/19/2020 0924       RADIOGRAPHIC STUDIES:  MR Brain W Wo Contrast  Result Date: 10/09/2020 CLINICAL DATA:  Metastatic lung cancer.  SRS treatment planning. EXAM: MRI HEAD WITHOUT AND WITH CONTRAST TECHNIQUE: Multiplanar, multiecho pulse sequences of the brain and surrounding structures were obtained without and with intravenous contrast. CONTRAST:  42mL MULTIHANCE GADOBENATE DIMEGLUMINE 529 MG/ML IV SOLN COMPARISON:  10/02/2020 FINDINGS: Brain:  There are more enhancing brain metastases visible on today's 3 T study than on the recent prior 1.5 T MRI. A total of 14 lesions are identified, 4 located in the cerebellum measuring up to 3 mm in size and 10 in the supratentorial brain measuring up to 6 mm. There is no significant edema associated with any of the lesions. No acute infarct, intracranial hemorrhage, midline shift, or extra-axial fluid collection is evident. The ventricles and sulci are normal. T2 hyperintensities in the cerebral white matter bilaterally are nonspecific but compatible with minimal chronic small vessel ischemic disease. Vascular: Major intracranial vascular flow voids are preserved. Skull and upper cervical spine: A 1 cm T1 hypointense, T2 hyperintense,  enhancing focus in the inferior aspect of the clivus on the left is unchanged from a 05/20/2019 head MRI and is therefore felt to reflect a benign lesion rather than a metastasis. Sinuses/Orbits: Minimal mucosal thickening in the paranasal sinuses. Clear mastoid air cells. Unremarkable orbits. Other: Chronic 8 mm cystic focus along the posterior aspect of the right parotid tail. IMPRESSION: 14 subcentimeter brain metastases. No edema. Electronically Signed   By: Logan Bores M.D.   On: 10/09/2020 13:17   MR BRAIN W WO CONTRAST  Result Date: 10/03/2020 CLINICAL DATA:  Non-small cell lung cancer. EXAM: MRI HEAD WITHOUT AND WITH CONTRAST TECHNIQUE: Multiplanar, multiecho pulse sequences of the brain and surrounding structures were obtained without and with intravenous contrast. CONTRAST:  72mL GADAVIST GADOBUTROL 1 MMOL/ML IV SOLN COMPARISON:  MR head 05/20/2019 FINDINGS: Brain: Multiple small enhancing foci are consistent with brain metastases. Lesions are measured on series 16, postcontrast axial images. A 4 mm lesion is present in the left frontal operculum on image 75. No significant edema is associated. A 2.5 mm lesion is present in the right parietal lobe on image 82 without  significant associated edema. A 4 mm lesion is present in the medial left parietal lobe on image 83 without significant associated edema. A 3.5 mm right cerebellar lesion is present on image 26 without significant associated edema No acute infarct or hemorrhage is present. The ventricles are of normal size. No significant extraaxial fluid collection is present. The internal auditory canals are within normal limits. The brainstem and cerebellum are within normal limits. Postcontrast images are otherwise within normal limits. Vascular: Flow is present in the major intracranial arteries. Skull and upper cervical spine: Focal T2 hyperintense area along the left clivus is stable measuring 8 mm. Postcontrast enhancement is associated. No other osseous lesions are present. Craniocervical junction is within normal limits. Mild degenerative changes are noted in the upper cervical spine. Sinuses/Orbits: The paranasal sinuses and mastoid air cells are clear. The globes and orbits are within normal limits. IMPRESSION: 1. Four small enhancing foci consistent with brain metastases measuring up to 4 mm. No significant associated edema or mass effect. 2. Stable 8 mm lesion along the left clivus without significant associated edema. This likely represents a benign hemangioma. Slight hypodensity noted on the prior CT. Metastatic disease not excluded. Recommend continued attention to this area. Electronically Signed   By: San Morelle M.D.   On: 10/03/2020 06:33   NM PET Image Initial (PI) Skull Base To Thigh  Result Date: 10/04/2020 CLINICAL DATA:  Initial treatment strategy for non-small-cell lung cancer. EXAM: NUCLEAR MEDICINE PET SKULL BASE TO THIGH TECHNIQUE: 14.1 mCi F-18 FDG was injected intravenously. Full-ring PET imaging was performed from the skull base to thigh after the radiotracer. CT data was obtained and used for attenuation correction and anatomic localization. Fasting blood glucose: 121 mg/dl COMPARISON:   CT chest dated September 04, 2020 FINDINGS: Mediastinal blood pool activity: SUV max 2.9 Liver activity: SUV max 3.7 NECK: No hypermetabolic lymph nodes in the neck. Incidental CT findings: none CHEST: Hypermetabolic left upper lobe mass measuring approximately 3.7 x 3.7 cm with an SUV max of 12.7, unchanged in size compared to prior CT. Hypermetabolic left paraaortic/AP window lymph node measuring 1.1 cm in short axis on series 4, image 61 with an SUV max of 5.2, unchanged in size. Hypermetabolic left hilar lymph node measuring approximately 1.1 cm in short axis on image 65 with an SUV max of 8.1, unchanged in size. Incidental CT  findings: none ABDOMEN/PELVIS: Cholelithiasis with no gallbladder wall thickening. Punctate nonobstructing left renal stone. Wall thickening of the sigmoid colon with adjacent fat stranding, hypermetabolic activity and associated sigmoid diverticula. Atherosclerotic disease of the abdominal aorta. Incidental CT findings: none SKELETON: Focal hypermetabolic activity of the left iliac bone adjacent to the SI joint with an SUV max of 10.8, a subtle lytic lesion is seen at this area on CT image 159. Incidental CT findings: none IMPRESSION: Left upper lobe mass with hypermetabolic activity, compatible with primary lung malignancy. Hypermetabolic left hilar and paraaortic/AP window lymph nodes, compatible with nodal metastatic disease. Focal hypermetabolic lesion of the left iliac bone with subtle associated lytic lesion seen on CT, concerning for osseous metastatic disease. Incidental findings compatible with acute uncomplicated diverticulitis. These results will be called to the ordering clinician or representative by the Radiologist Assistant, and communication documented in the PACS or [employer]. Electronically Signed   By: Yetta Glassman M.D.   On: 10/04/2020 16:17     ASSESSMENT/PLAN:  This is a very pleasant 60 year old African-American male with stage IV (T2b, N2, M1 C)  non-small cell lung cancer, adenocarcinoma.  He presented with a left upper lobe lung mass in addition to left hilar and mediastinal lymphadenopathy.  He had also had a small left iliac osseous lesion and metastatic disease to the brain.  He was diagnosed in September 2022.  PD-L1 expression 10%.  Guardant 360 does not have enough circulating tumor and his tissue was negative for any actionable mutations.   The patient is currently undergoing palliative systemic chemotherapy with carboplatin for an AUC of 5, Alimta 500 mg per metered squared, Keytruda 200 mg IV every 3 weeks.  The patient status post 1 cycle tolerated it fairly well.   He is scheduled to undergo SRS to the metastatic brain lesion on 10/21/2020 under the care of Dr. Lisbeth Renshaw  The patient was seen with Dr. Julien Nordmann. Labs were reviewed. Recommend that he continue on the same treatment at the same dose.  We will see him back for follow-up visit in 2 weeks for evaluation before starting cycle #2.  He will continue to follow with the social worker once week. He is going to see the financial advocate after his appointment today. Discussed consideration of joining the lung cancer support groups as well.   His blood pressure was elevated. He ran out of his anti-hypertensive medications for 3 months. Advised to contact his PCP for a refill and to take this as prescribed. Upon recheck, his BP was improved to 140/82.   He has evidence of thrush on exam. We will also send him a prescription for diflucan for fungal infection. We will send to his pharmacy. Also advised to use salt water rinses/biotene.  Advised to try to use a trial of Prilosec regularly for the epigastric discomfort. I reviewed how to take this. He described it moreso as an unusual sick sensation as opposed to pain, although compazine was not effective. No changes in bowel habits, no rectal bleeding, no nausea/vomiting, or fevers. His symptoms resolve after 10-20 minutes. Advised to  continue tylenol and follow up with his gastroenterologist if no improvement. He states he has a history of stomach ulcers.   I have sent in a prescription for remeron for his depression, decreased appetite, and insomnia.   The patient was advised to call immediately if she has any concerning symptoms in the interval. The patient voices understanding of current disease status and treatment options and is in  agreement with the current care plan. All questions were answered. The patient knows to call the clinic with any problems, questions or concerns. We can certainly see the patient much sooner if necessary   No orders of the defined types were placed in this encounter.    Raymond [name] L Raymond Cowin, PA-C 10/19/20  ADDENDUM: Hematology/Oncology Attending: I had a face-to-face encounter with the patient today.  I reviewed his record, lab and recommended his care plan.  This is a very pleasant 60 years old African-American male diagnosed with a stage IV non-small cell lung cancer, adenocarcinoma presented with left upper lobe lung mass in addition to left hilar and mediastinal lymphadenopathy as well as a small left iliac osseous metastasis and multiple metastatic brain lesion diagnosed in September 2021 with no actionable mutation and PD-L1 expression of 10%. The patient is currently undergoing systemic chemotherapy with carboplatin, Alimta and Keytruda.  The first cycle of his treatment was giving without Keytruda as we were awaiting the molecular studies.  He tolerated the first week of his treatment well except for fatigue and occasional epigastric discomfort. He is feeling much better today.  He still have a lot of anxiety and depression regarding his recent diagnosis. I recommended for the patient to continue his treatment with systemic chemotherapy as planned with carboplatin, Alimta and Keytruda starting from cycle #2 in 2 weeks. Regarding the multiple brain metastasis, he is scheduled for  [facility] under the care of Dr. Lisbeth Renshaw. For the depression, I will start the patient on Remeron 30 mg p.o. nightly. The patient will come back for follow-up visit in 2 weeks for evaluation before starting cycle #2. He was advised to call immediately if he has any other concerning symptoms in the interval. The total time spent in the appointment was 30 minutes.  Disclaimer: This note was dictated with voice recognition software. Similar sounding words can inadvertently be transcribed and may be missed upon review. Eilleen Kempf, MD 10/19/20

## 2020-10-15 ENCOUNTER — Inpatient Hospital Stay: Payer: Self-pay | Admitting: General Practice

## 2020-10-15 ENCOUNTER — Encounter: Payer: Self-pay | Admitting: General Practice

## 2020-10-15 NOTE — Progress Notes (Signed)
[place] CSW Progress Notes  Called patient, he is struggling with multiple physical and emotional symptoms post chemotherapy.  He reports fatigue, "mouth feels yukky", sore throat, woozy/loss of balance, "I cannot stop crying."  Messaged treatment team with his concerns as most are medically related.  Discussed the emotional challenges of being treated for cancer.  Explored "crying all the time."  He rates his mood as 3/10, feels he has "let my wife down because I cannot work", denies any suicidal ideation or thoughts of self harm.  He feels he is exhausted/overwhelmed.  Is trying to maintain his full time work schedule until 10/19 - feels he needs the income.  However, he has had to come home early yesterday and today due to feeling unwell.  Discussed need for rest and recuperation while in cancer treatment.  Discussed various options for short term financial help [employer], [employer], [employer] gas card program, Cancer Care).  He has applied for short term disability with his employer and will be receiving necessary paperwork to file a claim in the mail.  He has connected with a mentor from [employer] and valued their phone conversation, encouraged him to reach out to them again.  He is confused about his insurance status.  States he had [employer] Medicare when he went off disability several years ago - he believes he is still covered under this plan and is trying to get a copy of his current card.  He will reach out to [employer].   Will call him again in a week for ongoing support.  Will send in application for [employer] gas card program and assist w other applications he brings to CSW.    Edwyna Shell, LCSW Clinical Social Worker Phone:  [phone]

## 2020-10-16 ENCOUNTER — Encounter: Payer: Self-pay | Admitting: Internal Medicine

## 2020-10-16 NOTE — Progress Notes (Signed)
Returned call from patient's voicemail left 10/13.  Patient states he would like to apply for [employer]. Advised what is needed to apply. He will bring on 10/19/20 to complete process after doctor visit.  He has my card for any additional financial questions or concerns.

## 2020-10-19 ENCOUNTER — Other Ambulatory Visit (HOSPITAL_COMMUNITY): Payer: Self-pay

## 2020-10-19 ENCOUNTER — Ambulatory Visit: Payer: Self-pay

## 2020-10-19 ENCOUNTER — Encounter: Payer: Self-pay | Admitting: Physician Assistant

## 2020-10-19 ENCOUNTER — Other Ambulatory Visit: Payer: Self-pay

## 2020-10-19 ENCOUNTER — Inpatient Hospital Stay: Payer: Self-pay

## 2020-10-19 ENCOUNTER — Inpatient Hospital Stay (HOSPITAL_BASED_OUTPATIENT_CLINIC_OR_DEPARTMENT_OTHER): Payer: Self-pay | Admitting: Physician Assistant

## 2020-10-19 ENCOUNTER — Encounter: Payer: Self-pay | Admitting: Internal Medicine

## 2020-10-19 VITALS — BP 140/82 | HR 83 | Temp 99.5°F | Resp 20 | Wt 283.3 lb

## 2020-10-19 DIAGNOSIS — C3412 Malignant neoplasm of upper lobe, left bronchus or lung: Secondary | ICD-10-CM

## 2020-10-19 DIAGNOSIS — C3492 Malignant neoplasm of unspecified part of left bronchus or lung: Secondary | ICD-10-CM

## 2020-10-19 DIAGNOSIS — B379 Candidiasis, unspecified: Secondary | ICD-10-CM

## 2020-10-19 DIAGNOSIS — Z5111 Encounter for antineoplastic chemotherapy: Secondary | ICD-10-CM

## 2020-10-19 DIAGNOSIS — R630 Anorexia: Secondary | ICD-10-CM

## 2020-10-19 LAB — CMP (CANCER CENTER ONLY)
ALT: 19 U/L (ref 0–44)
AST: 13 U/L — ABNORMAL LOW (ref 15–41)
Albumin: 3.4 g/dL — ABNORMAL LOW (ref 3.5–5.0)
Alkaline Phosphatase: 97 U/L (ref 38–126)
Anion gap: 8 (ref 5–15)
BUN: 19 mg/dL (ref 6–20)
CO2: 25 mmol/L (ref 22–32)
Calcium: 9 mg/dL (ref 8.9–10.3)
Chloride: 111 mmol/L (ref 98–111)
Creatinine: 0.9 mg/dL (ref 0.61–1.24)
GFR, Estimated: >60 mL/min (ref >60)
Glucose, Bld: 98 mg/dL (ref 70–99)
Potassium: 4.2 mmol/L (ref 3.5–5.1)
Sodium: 144 mmol/L (ref 135–145)
Total Bilirubin: 0.5 mg/dL (ref 0.3–1.2)
Total Protein: 6.7 g/dL (ref 6.5–8.1)

## 2020-10-19 LAB — CBC WITH DIFFERENTIAL (CANCER CENTER ONLY)
Abs Immature Granulocytes: 0.01 10*3/uL (ref 0.00–0.07)
Basophils Absolute: 0 10*3/uL (ref 0.0–0.1)
Basophils Relative: 0 %
Eosinophils Absolute: 0.2 10*3/uL (ref 0.0–0.5)
Eosinophils Relative: 4 %
HCT: 40.4 % (ref 39.0–52.0)
Hemoglobin: 13.1 g/dL (ref 13.0–17.0)
Immature Granulocytes: 0 %
Lymphocytes Relative: 33 %
Lymphs Abs: 1.6 10*3/uL (ref 0.7–4.0)
MCH: 28.3 pg (ref 26.0–34.0)
MCHC: 32.4 g/dL (ref 30.0–36.0)
MCV: 87.3 fL (ref 80.0–100.0)
Monocytes Absolute: 0.1 10*3/uL (ref 0.1–1.0)
Monocytes Relative: 3 %
Neutro Abs: 2.8 10*3/uL (ref 1.7–7.7)
Neutrophils Relative %: 60 %
Platelet Count: 177 10*3/uL (ref 150–400)
RBC: 4.63 MIL/uL (ref 4.22–5.81)
RDW: 13 % (ref 11.5–15.5)
WBC Count: 4.7 10*3/uL (ref 4.0–10.5)
nRBC: 0 % (ref 0.0–0.2)

## 2020-10-19 MED ORDER — FLUCONAZOLE 100 MG PO TABS: 100.0000 mg | ORAL_TABLET | Freq: Every day | ORAL | 0 refills | Status: DC

## 2020-10-19 MED ORDER — MIRTAZAPINE 15 MG PO TABS
15.0000 mg | ORAL_TABLET | Freq: Every day | ORAL | 2 refills | Status: DC
  Filled 2020-10-19: qty 30, 30d supply, fill #0

## 2020-10-19 MED ORDER — FLUCONAZOLE 100 MG PO TABS
100.0000 mg | ORAL_TABLET | Freq: Every day | ORAL | 0 refills | Status: DC
  Filled 2020-10-19: qty 10, 10d supply, fill #0

## 2020-10-19 MED ORDER — MIRTAZAPINE 15 MG PO TABS: 15.0000 mg | ORAL_TABLET | Freq: Every day | ORAL | 2 refills | Status: DC

## 2020-10-19 NOTE — Progress Notes (Signed)
Met with patient and spouse at registration to complete grant process.  Patient approved for one-time $1000 [employer] grant and to assist with personal expenses while going through treatment. Discussed in detail expenses and how they are covered. He has a copy of the approval letter and expense sheet along with Outpatient pharmacy along with my card in green folder for any additional financial questions or concerns. He received a gift card today for grant.  Staff message sent to [facility] for meds to be switched to [facility] for fill and grant submission.

## 2020-10-20 ENCOUNTER — Encounter: Payer: Self-pay | Admitting: Physician Assistant

## 2020-10-20 NOTE — Progress Notes (Signed)
Created GFE(Good Faith Estimate) to provide to patient.

## 2020-10-21 ENCOUNTER — Encounter: Payer: Self-pay | Admitting: Radiation Oncology

## 2020-10-21 ENCOUNTER — Ambulatory Visit
Admission: RE | Admit: 2020-10-21 | Discharge: 2020-10-21 | Disposition: A | Discharge status: Home / Self Care | Payer: Self-pay | Source: Ambulatory Visit | Attending: Radiation Oncology | Admitting: Radiation Oncology

## 2020-10-21 ENCOUNTER — Other Ambulatory Visit: Payer: Self-pay

## 2020-10-21 NOTE — Progress Notes (Signed)
Nurse monitoring complete status post 1 of 1 SRS treatments. Patient without complaints. Patient denies new or worsening neurologic symptoms. Vitals stable. Instructed patient to avoid strenuous activity for the next 24 hours. Instructed patient to call [phone] with needs related to treatment after hours or over the weekend. Patient and spouse verbalized understanding. Patient aware of 1 month F/U call with Shona Simpson, PA-C. Patient assisted out of clinic via wheelchair to wife's vehicle without incident.   Vitals:   10/21/20 1607  BP: (!) 166/83  Pulse: 84  Resp: 20  Temp: 98.2 F (36.8 C)  SpO2: 99%

## 2020-10-22 ENCOUNTER — Inpatient Hospital Stay: Payer: Self-pay | Admitting: General Practice

## 2020-10-22 DIAGNOSIS — C3492 Malignant neoplasm of unspecified part of left bronchus or lung: Secondary | ICD-10-CM

## 2020-10-22 NOTE — Progress Notes (Signed)
[place] CSW Progress Notes  Call to patient to check in w him per appointment.  Unable to reach at home or mobile number, left VM w my contact information and encouragement to return my call when convenient.  Edwyna Shell, LCSW Clinical Social Worker Phone:  [phone]

## 2020-10-23 ENCOUNTER — Telehealth: Payer: Self-pay | Admitting: Physician Assistant

## 2020-10-23 NOTE — Telephone Encounter (Signed)
Sch per 10/3 los, left msg

## 2020-10-23 NOTE — Progress Notes (Signed)
..  Patient is receiving Assistance Medication - Supplied Externally. Medication: Keytruda (pembrolizumab) Manufacture: [employer] Approval Dates: Approved from 10/22/2020 until 10/22/2021. ID: [id number] Reason: Self Pay First DOS: 11/02/2020. Marland KitchenJuan [name], CPhT IV Drug Replacement Specialist [place] Phone: [phone]

## 2020-10-26 ENCOUNTER — Ambulatory Visit: Payer: Self-pay | Admitting: Physician Assistant

## 2020-10-26 ENCOUNTER — Inpatient Hospital Stay: Payer: Self-pay

## 2020-10-26 ENCOUNTER — Other Ambulatory Visit: Payer: Self-pay

## 2020-10-26 ENCOUNTER — Ambulatory Visit: Payer: Self-pay

## 2020-10-26 ENCOUNTER — Encounter: Payer: Self-pay | Admitting: Internal Medicine

## 2020-10-26 DIAGNOSIS — C3412 Malignant neoplasm of upper lobe, left bronchus or lung: Secondary | ICD-10-CM

## 2020-10-26 LAB — CMP (CANCER CENTER ONLY)
ALT: 29 U/L (ref 0–44)
AST: 21 U/L (ref 15–41)
Albumin: 3.7 g/dL (ref 3.5–5.0)
Alkaline Phosphatase: 82 U/L (ref 38–126)
Anion gap: 7 (ref 5–15)
BUN: 12 mg/dL (ref 6–20)
CO2: 26 mmol/L (ref 22–32)
Calcium: 9.1 mg/dL (ref 8.9–10.3)
Chloride: 106 mmol/L (ref 98–111)
Creatinine: 0.91 mg/dL (ref 0.61–1.24)
GFR, Estimated: >60 mL/min (ref >60)
Glucose, Bld: 93 mg/dL (ref 70–99)
Potassium: 3.9 mmol/L (ref 3.5–5.1)
Sodium: 139 mmol/L (ref 135–145)
Total Bilirubin: 0.8 mg/dL (ref 0.3–1.2)
Total Protein: 7.1 g/dL (ref 6.5–8.1)

## 2020-10-26 LAB — CBC WITH DIFFERENTIAL (CANCER CENTER ONLY)
Abs Immature Granulocytes: 0 10*3/uL (ref 0.00–0.07)
Basophils Absolute: 0 10*3/uL (ref 0.0–0.1)
Basophils Relative: 0 %
Eosinophils Absolute: 0.1 10*3/uL (ref 0.0–0.5)
Eosinophils Relative: 2 %
HCT: 40.7 % (ref 39.0–52.0)
Hemoglobin: 13.2 g/dL (ref 13.0–17.0)
Immature Granulocytes: 0 %
Lymphocytes Relative: 33 %
Lymphs Abs: 1.6 10*3/uL (ref 0.7–4.0)
MCH: 28.2 pg (ref 26.0–34.0)
MCHC: 32.4 g/dL (ref 30.0–36.0)
MCV: 87 fL (ref 80.0–100.0)
Monocytes Absolute: 0.8 10*3/uL (ref 0.1–1.0)
Monocytes Relative: 17 %
Neutro Abs: 2.3 10*3/uL (ref 1.7–7.7)
Neutrophils Relative %: 48 %
Platelet Count: 115 10*3/uL — ABNORMAL LOW (ref 150–400)
RBC: 4.68 MIL/uL (ref 4.22–5.81)
RDW: 13.1 % (ref 11.5–15.5)
WBC Count: 4.9 10*3/uL (ref 4.0–10.5)
nRBC: 0 % (ref 0.0–0.2)

## 2020-10-26 LAB — TSH: TSH: 1.255 u[IU]/mL (ref 0.350–4.500)

## 2020-10-26 NOTE — Progress Notes (Signed)
Met with patient at registration to provide GFE(Good Faith Estimate).  Also asked if he had applied for Medicaid. He states he had not. Gave him a copy of the Medicaid application and advised to submit to [place]. He verbalized understanding.  He has my card for any additional financial questions or concerns.

## 2020-10-28 ENCOUNTER — Other Ambulatory Visit (HOSPITAL_COMMUNITY): Payer: Self-pay

## 2020-10-29 NOTE — Progress Notes (Signed)
Raymond [name] OFFICE PROGRESS NOTE  Raymond Mccreedy, MD [address] [place] [address]  DIAGNOSIS: Stage IV (T2b, N2, M1c) non-small cell lung cancer, adenocarcinoma presented with left upper lobe lung mass in addition to left hilar and mediastinal lymphadenopathy diagnosed in September 2022. He also has metastatic disease to the brain.   Molecular Studies: No actionable mutations   PRIOR THERAPY: 1) SRS to the metastatic brain lesions under the care of Dr. Lisbeth Renshaw Scheduled for 10/21/20  CURRENT THERAPY: 1) Carboplatin for an AUC of 5, Alimta 500 mg per metered squared, Keytruda 200 mg IV every 3 weeks. First dose on 10/12/20. Status post 1 cycle  INTERVAL HISTORY: Raymond [name] 60 y.o. male returns to the clinic today for a follow-up visit .  The patient was recently diagnosed with stage IV lung cancer.  He completed SRS to the metastatic brain lesions on 10/21/2020.  He is currently undergoing palliative systemic chemotherapy and immunotherapy.  He has been having a hard time adjusting to his new diagnosis and has been followed closely by the Education officer, museum and chaplain. The chaplain is planning to see him today. I had started him on remeron at his last appointment to help win depression, appetite, and insomnia. He ran out of this and has not picked up his refill. He previously declined information on the lung cancer support group but after rediscussion today, the patient is open to joining the lung cancer support group.  I also discussed with him referral to psychiatry which he is open to as well.  With his first cycle of treatment, he had taste alterations, insomnia, and decreased appetite.  He was found to have thrush on exam.  He also had some nausea.  He was treated for the thrush.  He completed his Diflucan.  Today he denies any fever, chills, night sweats, or unexplained weight loss.  He denies any chest pain, cough, or hemoptysis.  Reports mild dyspnea  on exertion which he attributes to anxiety.  His nausea has improved he denies any diarrhea, constipation, or vomiting.  Denies any rashes or skin changes.  He denies any headache or visual changes except for an occasional frontal sinus headache for which he will take Tylenol.  Notes an occasional occipital headache "every now and then".  He completed SRS to the metastatic brain lesion on 10/21/20.  He has a follow-up appointment later this month.  The patient notes that he has been having some increased cramping in his legs for which he started taking potassium supplements at the advice of his brother.  The patient generalized weakness and fatigue.  He started taking iron supplements as well and notices for the last 2 days he has been having dark stools.  No overt blood.  The patient is here today for evaluation and repeat blood work before starting cycle #2.    MEDICAL HISTORY: Past Medical History:  Diagnosis Date   Arthritis    Baker's cyst    right   Diverticulitis    GERD (gastroesophageal reflux disease)    Heart attack ([place]) 01/04/2004   History of hiatal hernia    Hypertension    Malignant neoplasm of upper lobe of left lung ([place]) 09/24/2020   Pneumonia    as a child   Sleep apnea    no longer on Cpap    ALLERGIES:  is allergic to benadryl [diphenhydramine].  MEDICATIONS:  Current Outpatient Medications  Medication Sig Dispense Refill   acetaminophen (TYLENOL)  650 MG CR tablet Take 650 mg by mouth every 8 (eight) hours as needed for pain.     ciprofloxacin (CIPRO) 500 MG tablet Take 1 tablet (500 mg total) by mouth 2 (two) times daily. 20 tablet 0   etodolac (LODINE) 500 MG tablet Take 500 mg by mouth 2 (two) times daily.     fluconazole (DIFLUCAN) 100 MG tablet Take 1 tablet (100 mg total) by mouth daily. 10 tablet 0   folic acid (FOLVITE) 1 MG tablet Take 1 tablet (1 mg total) by mouth daily. 30 tablet 2   gabapentin (NEURONTIN) 600 MG tablet Take 600 mg by mouth at bedtime.      losartan-hydrochlorothiazide (HYZAAR) 50-12.5 MG tablet Take 1 tablet by mouth daily.     metroNIDAZOLE (FLAGYL) 500 MG tablet Take 1 tablet (500 mg total) by mouth 3 (three) times daily. 30 tablet 0   mirtazapine (REMERON) 15 MG tablet Take 1 tablet (15 mg total) by mouth at bedtime. 30 tablet 2   Misc Natural Products (OSTEO BI-FLEX ADV JOINT SHIELD) TABS Take 1 tablet by mouth daily.     MULTIPLE VITAMIN PO Take 1 tablet by mouth daily.     omeprazole (PRILOSEC) 10 MG capsule Take 10 mg by mouth as needed.     oxyCODONE-acetaminophen (PERCOCET) 5-325 MG tablet Take 1-2 tablets by mouth every 4 (four) hours as needed for severe pain. 15 tablet 0   Probiotic Product (PROBIOTIC DAILY PO) Take 1 capsule by mouth daily.     prochlorperazine (COMPAZINE) 10 MG tablet Take 1 tablet (10 mg total) by mouth every 6 (six) hours as needed for nausea or vomiting. 30 tablet 0   Wheat Dextrin (BENEFIBER DRINK MIX) PACK Take by mouth.     No current facility-administered medications for this visit.    SURGICAL HISTORY:  Past Surgical History:  Procedure Laterality Date   BRONCHIAL BIOPSY  09/14/2020   Procedure: BRONCHIAL BIOPSIES;  Surgeon: Collene Gobble, MD;  Location: [facility] ENDOSCOPY;  Service: Cardiopulmonary;;   BRONCHIAL BRUSHINGS  09/14/2020   Procedure: BRONCHIAL BRUSHINGS;  Surgeon: Collene Gobble, MD;  Location: [place];  Service: Cardiopulmonary;;   BRONCHIAL NEEDLE ASPIRATION BIOPSY  09/14/2020   Procedure: BRONCHIAL NEEDLE ASPIRATION BIOPSIES;  Surgeon: Collene Gobble, MD;  Location: [place];  Service: Cardiopulmonary;;   CARDIAC CATHETERIZATION     CARPAL TUNNEL RELEASE     right   FIDUCIAL MARKER PLACEMENT  09/14/2020   Procedure: FIDUCIAL MARKER PLACEMENT;  Surgeon: Collene Gobble, MD;  Location: [place];  Service: Cardiopulmonary;;   HAND SURGERY     left   KNEE SURGERY     left   VIDEO BRONCHOSCOPY N/A 09/14/2020   Procedure: ROBOTIC VIDEO BRONCHOSCOPY WITH  FLUORO;  Surgeon: Collene Gobble, MD;  Location: [place];  Service: Cardiopulmonary;  Laterality: N/A;   VIDEO BRONCHOSCOPY WITH ENDOBRONCHIAL ULTRASOUND N/A 09/14/2020   Procedure: VIDEO BRONCHOSCOPY WITH ENDOBRONCHIAL ULTRASOUND;  Surgeon: Collene Gobble, MD;  Location: [place];  Service: Cardiopulmonary;  Laterality: N/A;   VIDEO BRONCHOSCOPY WITH RADIAL ENDOBRONCHIAL ULTRASOUND  09/14/2020   Procedure: VIDEO BRONCHOSCOPY WITH RADIAL ENDOBRONCHIAL ULTRASOUND;  Surgeon: Collene Gobble, MD;  Location: [facility] ENDOSCOPY;  Service: Cardiopulmonary;;    REVIEW OF SYSTEMS:   Review of Systems  Constitutional: Positive for fatigue. Negative for appetite change, chills, fever and unexpected weight change.  HENT: Positive for thrush.  Negative for mouth sores, nosebleeds, sore throat and trouble swallowing.   Eyes: Negative  for eye problems and icterus.  Respiratory: Positive for mild shortness of breath with exertion.  Negative for cough, hemoptysis, and wheezing.   Cardiovascular: Negative for chest pain and leg swelling.  Gastrointestinal: Negative for abdominal pain, constipation, diarrhea, nausea and vomiting.  Genitourinary: Negative for bladder incontinence, difficulty urinating, dysuria, frequency and hematuria.   Musculoskeletal: Positive for chronic low back pain.  Negative for back pain, gait problem, neck pain and neck stiffness.  Skin: Negative for itching and rash.  Neurological: Positive for occasional headaches.  Negative for dizziness, extremity weakness, gait problem, headaches, light-headedness and seizures.  Hematological: Negative for adenopathy. Does not bruise/bleed easily.  Psychiatric/Behavioral: Negative for confusion, depression and sleep disturbance. The patient is not nervous/anxious.     PHYSICAL EXAMINATION:  Blood pressure (!) 167/90, pulse 94, temperature 98.7 F (37.1 C), resp. rate 18, weight 289 lb 6 oz (131.3 kg), SpO2 99 %.  ECOG PERFORMANCE STATUS:  1  Physical Exam  Constitutional: Oriented to person, place, and time and well-developed, well-nourished, and in no distress. HENT:  Head: Normocephalic and atraumatic.  Mouth/Throat: Oropharynx is clear and moist. No oropharyngeal exudate.  Eyes: Conjunctivae are normal. Right eye exhibits no discharge. Left eye exhibits no discharge. No scleral icterus.  Neck: Normal range of motion. Neck supple.  Cardiovascular: Normal rate, regular rhythm, normal heart sounds and intact distal pulses.   Pulmonary/Chest: Effort normal and breath sounds normal. No respiratory distress. No wheezes. No rales.  Abdominal: Soft. Bowel sounds are normal. Exhibits no distension and no mass. There is no tenderness.  Musculoskeletal: Normal range of motion. Exhibits no edema.  Lymphadenopathy:    No cervical adenopathy.  Neurological: Alert and oriented to person, place, and time. Exhibits normal muscle tone. Gait normal. Coordination normal.  Skin: Skin is warm and dry. No rash noted. Not diaphoretic. No erythema. No pallor.  Psychiatric: Mood, memory and judgment normal.  Vitals reviewed.  LABORATORY DATA: Lab Results  Component Value Date   WBC 5.5 11/02/2020   HGB 13.0 11/02/2020   HCT 40.0 11/02/2020   MCV 87.1 11/02/2020   PLT 274 11/02/2020      Chemistry      Component Value Date/Time   NA 143 11/02/2020 0918   K 3.9 11/02/2020 0918   CL 112 (H) 11/02/2020 0918   CO2 24 11/02/2020 0918   BUN 9 11/02/2020 0918   CREATININE 0.86 11/02/2020 0918      Component Value Date/Time   CALCIUM 8.7 (L) 11/02/2020 0918   ALKPHOS 88 11/02/2020 0918   AST 19 11/02/2020 0918   ALT 23 11/02/2020 0918   BILITOT 0.3 11/02/2020 0918       RADIOGRAPHIC STUDIES:  MR Brain W Wo Contrast  Result Date: 10/09/2020 CLINICAL DATA:  Metastatic lung cancer.  SRS treatment planning. EXAM: MRI HEAD WITHOUT AND WITH CONTRAST TECHNIQUE: Multiplanar, multiecho pulse sequences of the brain and surrounding  structures were obtained without and with intravenous contrast. CONTRAST:  57m MULTIHANCE GADOBENATE DIMEGLUMINE 529 MG/ML IV SOLN COMPARISON:  10/02/2020 FINDINGS: Brain: There are more enhancing brain metastases visible on today's 3 T study than on the recent prior 1.5 T MRI. A total of 14 lesions are identified, 4 located in the cerebellum measuring up to 3 mm in size and 10 in the supratentorial brain measuring up to 6 mm. There is no significant edema associated with any of the lesions. No acute infarct, intracranial hemorrhage, midline shift, or extra-axial fluid collection is evident. The ventricles and sulci are  normal. T2 hyperintensities in the cerebral white matter bilaterally are nonspecific but compatible with minimal chronic small vessel ischemic disease. Vascular: Major intracranial vascular flow voids are preserved. Skull and upper cervical spine: A 1 cm T1 hypointense, T2 hyperintense, enhancing focus in the inferior aspect of the clivus on the left is unchanged from a 05/20/2019 head MRI and is therefore felt to reflect a benign lesion rather than a metastasis. Sinuses/Orbits: Minimal mucosal thickening in the paranasal sinuses. Clear mastoid air cells. Unremarkable orbits. Other: Chronic 8 mm cystic focus along the posterior aspect of the right parotid tail. IMPRESSION: 14 subcentimeter brain metastases. No edema. Electronically Signed   By: Logan Bores M.D.   On: 10/09/2020 13:17     ASSESSMENT/PLAN:  This is a very pleasant 60 year old African-American male with stage IV (T2b, N2, M1 C) non-small cell lung cancer, adenocarcinoma.  He presented with a left upper lobe lung mass in addition to left hilar and mediastinal lymphadenopathy.  He had also had a small left iliac osseous lesion and metastatic disease to the brain.  He was diagnosed in September 2022.  PD-L1 expression 10%.  Guardant 360 does not have enough circulating tumor and his tissue was negative for any actionable mutations.     The patient is currently undergoing palliative systemic chemotherapy with carboplatin for an AUC of 5, Alimta 500 mg per metered squared, Keytruda 200 mg IV every 3 weeks.  The patient status post 1 cycle tolerated it fairly well.    He is scheduled to undergo SRS to the metastatic brain lesion on 10/21/2020 under the care of Dr. Maudie Mercury were reviewed. Recommend that he continue on the same treatment at the same dose.  We will proceed with cycle #2 today.  We will see him back for follow-up visit in 3 weeks for evaluation before starting cycle #3.  Told the patient to avoid taking potassium supplements as hyperkalemia is dangerous.  We checked his labs closely every week and if he requires potassium supplements we will let him know.  Courage him to drink plenty of fluid and Gatorade for electrolytes.  Courage the patient to walk or do pool exercises for depression and weight loss.   He will continue taking Remeron for his depression, decreased appetite, and insomnia.  I also have referred him to a psychiatrist.  The chaplain is planning on seeing him in the infusion room today.  The patient was given information about the lung cancer support group which he may go to this month.  Regarding the taste alterations, encouraged the patient to use Biotene and salt water rinses and to have good oral hygiene, particularly with his tongue.  He is planning to meet with the social worker this week on 11/05/2020.  Advised him to discuss insurance with them so that he may get his other prescription medications that are prescribed by his other primary doctor and health specialist.  The patient has been having some ongoing issues with high blood pressure due to not being able to obtain a refill from his primary doctor due to owing them money.  He denies any chest pain, visual changes, or headaches today.  Dr. Julien Nordmann recommended working with his Education officer, museum and primary doctor regarding his  refills.  Patient noted 2 days of dark stools which may be secondary to his recent iron supplement use.  His hemoglobin is within normal limits.  Low suspicion for GI blood loss.  We will continue to monitor his labs closely every  week.  The patient was advised to call immediately if he has any concerning symptoms in the interval. The patient voices understanding of current disease status and treatment options and is in agreement with the current care plan. All questions were answered. The patient knows to call the clinic with any problems, questions or concerns. We can certainly see the patient much sooner if necessary         Orders Placed This Encounter  Procedures   Ambulatory referral to Psychiatry    Referral Priority:   Routine    Referral Type:   Psychiatric    Referral Reason:   Specialty Services Required    Requested Specialty:   Psychiatry    Number of Visits Requested:   1       The total time spent in the appointment was 30-39 minutes.   Sherie Dobrowolski L Enzley Kitchens, PA-C 11/02/20

## 2020-10-29 NOTE — Op Note (Signed)
  Name: Raymond [name]  MRN: [id number]  Date: 10/21/2020   DOB: 1960-03-17  Stereotactic Radiosurgery Operative Note  PRE-OPERATIVE DIAGNOSIS:  Non-small Cell Lung Cancer with Multiple Brain Metastases  POST-OPERATIVE DIAGNOSIS:  Non-small Cell Lung Cancer with Multiple Brain Metastases  PROCEDURE:  Stereotactic Radiosurgery  SURGEON:  Jairo Ben, MD  RADIATION ONCOLOGIST: Dr. Kyung Rudd, MD  NARRATIVE: The patient underwent a radiation treatment planning session in the radiation oncology simulation suite under the care of the radiation oncology physician and physicist.  I participated closely in the radiation treatment planning afterwards. The patient underwent planning CT which was fused to 3T high resolution MRI with 1 mm axial slices.  These images were fused on the planning system.  We contoured the gross target volumes and subsequently expanded this to yield the Planning Target Volume. I actively participated in the planning process.  I helped to define and review the target contours and also the contours of the optic pathway, eyes, brainstem and selected nearby organs at risk.  All the dose constraints for critical structures were reviewed and compared to AAPM Task Group 101.  The prescription dose conformity was reviewed.  I approved the plan electronically.    Accordingly, Raymond [name] was brought to the TrueBeam stereotactic radiation treatment linac and placed in the custom immobilization mask.  The patient was aligned according to the IR fiducial markers with BrainLab Exactrac, then orthogonal x-rays were used in ExacTrac with the 6DOF robotic table and the shifts were made to align the patient  Raymond [name] received stereotactic radiosurgery uneventfully.    Lesions treated:  14 all to 20Gy  Complex lesions treated:  0 (>3.5 cm, <70mm of optic path, or within the brainstem)   The detailed description of the procedure is recorded in the radiation oncology procedure  note.  I was present for the duration of the procedure.  DISPOSITION:  Following delivery, the patient was transported to nursing in stable condition and monitored for possible acute effects to be discharged to home in stable condition with follow-up in one month.  Jairo Ben, MD 10/29/2020 10:41 AM

## 2020-11-01 ENCOUNTER — Encounter: Payer: Self-pay | Admitting: Internal Medicine

## 2020-11-01 NOTE — Progress Notes (Signed)
                                                                                                                                                             Patient Name: Raymond [name] MRN: [id number] DOB: 02-01-1960 Referring Physician: Baltazar Apo (Profile Not Attached) Date of Service: 10/21/2020 [facility], [place]                                                        End Of Treatment Note  Diagnoses: C79.31-Secondary malignant neoplasm of brain  Cancer Staging: Stage IV, cT2bN2M1c, NSCLC, adenocarcinoma of the left lower lobe.  Intent: Palliative  Radiation Treatment Dates: 10/21/2020 through 10/21/2020 Site Technique Total Dose (Gy) Dose per Fx (Gy) Completed Fx Beam Energies  Brain: Brain  PTV_1_58mm PTV_2_90mm  PTV_3_71mm  PTV_4_40mm  PTV_5_36mm PTV_6_102mm  PTV_7_67mm PTV_8_46mm PTV_9_38mm  PTV_10_19mm  PTV_11_8mm  PTV_12_106mm  PTV_13_58mm  PTV_14_8mm  IMRT 20/20 20 1/1 [id number]   Narrative: The patient tolerated radiation therapy relatively well.   Plan: The patient will receive a call in about one month from the radiation oncology department. He will continue follow up with Dr. Julien Nordmann as well.   ________________________________________________    Carola Rhine, [facility]

## 2020-11-02 ENCOUNTER — Inpatient Hospital Stay: Payer: Self-pay

## 2020-11-02 ENCOUNTER — Ambulatory Visit: Payer: Self-pay

## 2020-11-02 ENCOUNTER — Other Ambulatory Visit: Payer: Self-pay

## 2020-11-02 ENCOUNTER — Inpatient Hospital Stay (HOSPITAL_BASED_OUTPATIENT_CLINIC_OR_DEPARTMENT_OTHER): Payer: Self-pay | Admitting: Physician Assistant

## 2020-11-02 ENCOUNTER — Encounter: Payer: Self-pay | Admitting: General Practice

## 2020-11-02 VITALS — BP 167/90 | HR 94 | Temp 98.7°F | Resp 18 | Wt 289.4 lb

## 2020-11-02 DIAGNOSIS — I10 Essential (primary) hypertension: Secondary | ICD-10-CM | POA: Insufficient documentation

## 2020-11-02 DIAGNOSIS — C3492 Malignant neoplasm of unspecified part of left bronchus or lung: Secondary | ICD-10-CM

## 2020-11-02 DIAGNOSIS — Z5111 Encounter for antineoplastic chemotherapy: Secondary | ICD-10-CM

## 2020-11-02 DIAGNOSIS — F32A Depression, unspecified: Secondary | ICD-10-CM

## 2020-11-02 DIAGNOSIS — C3412 Malignant neoplasm of upper lobe, left bronchus or lung: Secondary | ICD-10-CM

## 2020-11-02 DIAGNOSIS — I159 Secondary hypertension, unspecified: Secondary | ICD-10-CM

## 2020-11-02 LAB — CBC WITH DIFFERENTIAL (CANCER CENTER ONLY)
Abs Immature Granulocytes: 0.09 10*3/uL — ABNORMAL HIGH (ref 0.00–0.07)
Basophils Absolute: 0 10*3/uL (ref 0.0–0.1)
Basophils Relative: 1 %
Eosinophils Absolute: 0.1 10*3/uL (ref 0.0–0.5)
Eosinophils Relative: 2 %
HCT: 40 % (ref 39.0–52.0)
Hemoglobin: 13 g/dL (ref 13.0–17.0)
Immature Granulocytes: 2 %
Lymphocytes Relative: 36 %
Lymphs Abs: 2 10*3/uL (ref 0.7–4.0)
MCH: 28.3 pg (ref 26.0–34.0)
MCHC: 32.5 g/dL (ref 30.0–36.0)
MCV: 87.1 fL (ref 80.0–100.0)
Monocytes Absolute: 0.7 10*3/uL (ref 0.1–1.0)
Monocytes Relative: 12 %
Neutro Abs: 2.6 10*3/uL (ref 1.7–7.7)
Neutrophils Relative %: 47 %
Platelet Count: 274 10*3/uL (ref 150–400)
RBC: 4.59 MIL/uL (ref 4.22–5.81)
RDW: 13.5 % (ref 11.5–15.5)
WBC Count: 5.5 10*3/uL (ref 4.0–10.5)
nRBC: 0 % (ref 0.0–0.2)

## 2020-11-02 LAB — CMP (CANCER CENTER ONLY)
ALT: 23 U/L (ref 0–44)
AST: 19 U/L (ref 15–41)
Albumin: 3.3 g/dL — ABNORMAL LOW (ref 3.5–5.0)
Alkaline Phosphatase: 88 U/L (ref 38–126)
Anion gap: 7 (ref 5–15)
BUN: 9 mg/dL (ref 6–20)
CO2: 24 mmol/L (ref 22–32)
Calcium: 8.7 mg/dL — ABNORMAL LOW (ref 8.9–10.3)
Chloride: 112 mmol/L — ABNORMAL HIGH (ref 98–111)
Creatinine: 0.86 mg/dL (ref 0.61–1.24)
GFR, Estimated: >60 mL/min (ref >60)
Glucose, Bld: 145 mg/dL — ABNORMAL HIGH (ref 70–99)
Potassium: 3.9 mmol/L (ref 3.5–5.1)
Sodium: 143 mmol/L (ref 135–145)
Total Bilirubin: 0.3 mg/dL (ref 0.3–1.2)
Total Protein: 6.6 g/dL (ref 6.5–8.1)

## 2020-11-02 MED ORDER — PALONOSETRON HCL INJECTION 0.25 MG/5ML
0.2500 mg | Freq: Once | INTRAVENOUS | Status: AC
Start: 2020-11-02 — End: 2020-11-02
  Administered 2020-11-02: 0.25 mg via INTRAVENOUS
  Filled 2020-11-02: qty 5

## 2020-11-02 MED ORDER — SODIUM CHLORIDE 0.9 % IV SOLN
750.0000 mg | Freq: Once | INTRAVENOUS | Status: AC
  Administered 2020-11-02: 750 mg via INTRAVENOUS
  Filled 2020-11-02: qty 75

## 2020-11-02 MED ORDER — SODIUM CHLORIDE 0.9 % IV SOLN: Freq: Once | INTRAVENOUS | Status: AC

## 2020-11-02 MED ORDER — SODIUM CHLORIDE 0.9 % IV SOLN
10.0000 mg | Freq: Once | INTRAVENOUS | Status: AC
  Administered 2020-11-02: 10 mg via INTRAVENOUS
  Filled 2020-11-02: qty 10

## 2020-11-02 MED ORDER — LORATADINE 10 MG PO TABS
10.0000 mg | ORAL_TABLET | Freq: Every day | ORAL | Status: DC
  Administered 2020-11-02: 10 mg via ORAL
  Filled 2020-11-02: qty 1

## 2020-11-02 MED ORDER — SODIUM CHLORIDE 0.9 % IV SOLN
150.0000 mg | Freq: Once | INTRAVENOUS | Status: AC
  Administered 2020-11-02: 150 mg via INTRAVENOUS
  Filled 2020-11-02: qty 150

## 2020-11-02 MED ORDER — SODIUM CHLORIDE 0.9 % IV SOLN
500.0000 mg/m2 | Freq: Once | INTRAVENOUS | Status: AC
  Administered 2020-11-02: 1300 mg via INTRAVENOUS
  Filled 2020-11-02: qty 40

## 2020-11-02 MED ORDER — SODIUM CHLORIDE 0.9 % IV SOLN
200.0000 mg | Freq: Once | INTRAVENOUS | Status: AC
  Administered 2020-11-02: 200 mg via INTRAVENOUS
  Filled 2020-11-02: qty 8

## 2020-11-02 NOTE — Progress Notes (Signed)
Cap CrCl =167mL/min for carbo dose

## 2020-11-02 NOTE — Progress Notes (Signed)
[facility] Spiritual Care Note  Followed up with Mr Broom in infusion as planned. He is working hard to figure out which coping tools are transferable to this cancer situation, given that he "can't just beat [resolve] it" like he has tackled other problems in his life. Finding hope is very helpful in his coping; current sources include a positive report about some of his numbers today from Cassie [name]/PA, his connections with and gratitude for his treatment team, recollecting his success and growth in response to past challenges, and the opportunity to connect with Spiritual Care. We plan to follow up at his next treatment.   [place], [place], [facility] Pager [phone] Voicemail [phone]

## 2020-11-02 NOTE — Patient Instructions (Signed)
[facility]  Discharge Instructions: Thank you for choosing [facility] to provide your oncology and hematology care.   If you have a lab appointment with the [facility], please go directly to the [facility] and check in at the registration area.   Wear comfortable clothing and clothing appropriate for easy access to any Portacath or PICC line.   We strive to give you quality time with your provider. You may need to reschedule your appointment if you arrive late (15 or more minutes).  Arriving late affects you and other patients whose appointments are after yours.  Also, if you miss three or more appointments without notifying the office, you may be dismissed from the clinic at the provider's discretion.      For prescription refill requests, have your pharmacy contact our office and allow 72 hours for refills to be completed.    Today you received the following chemotherapy and/or immunotherapy agents Keytruda, Alimta, Carbo      To help prevent nausea and vomiting after your treatment, we encourage you to take your nausea medication as directed.  BELOW ARE SYMPTOMS THAT SHOULD BE REPORTED IMMEDIATELY: *FEVER GREATER THAN 100.4 F (38 C) OR HIGHER *CHILLS OR SWEATING *NAUSEA AND VOMITING THAT IS NOT CONTROLLED WITH YOUR NAUSEA MEDICATION *UNUSUAL SHORTNESS OF BREATH *UNUSUAL BRUISING OR BLEEDING *URINARY PROBLEMS (pain or burning when urinating, or frequent urination) *BOWEL PROBLEMS (unusual diarrhea, constipation, pain near the anus) TENDERNESS IN MOUTH AND THROAT WITH OR WITHOUT PRESENCE OF ULCERS (sore throat, sores in mouth, or a toothache) UNUSUAL RASH, SWELLING OR PAIN  UNUSUAL VAGINAL DISCHARGE OR ITCHING   Items with * indicate a potential emergency and should be followed up as soon as possible or go to the Emergency Department if any problems should occur.  Please show the CHEMOTHERAPY ALERT CARD or IMMUNOTHERAPY ALERT CARD at  check-in to the Emergency Department and triage nurse.  Should you have questions after your visit or need to cancel or reschedule your appointment, please contact [facility]  Dept: [phone]  and follow the prompts.  Office hours are 8:00 a.m. to 4:30 p.m. Monday - Friday. Please note that voicemails left after 4:00 p.m. may not be returned until the following business day.  We are closed weekends and major holidays. You have access to a nurse at all times for urgent questions. Please call the main number to the clinic Dept: [phone] and follow the prompts.   For any non-urgent questions, you may also contact your provider using MyChart. We now offer e-Visits for anyone 13 and older to request care online for non-urgent symptoms. For details visit [email or web address].   Also download the MyChart app! Go to the app store, search "MyChart", open the app, select [facility], and log in with your MyChart username and password.  Due to Covid, a mask is required upon entering the hospital/clinic. If you do not have a mask, one will be given to you upon arrival. For doctor visits, patients may have 1 support person aged 63 or older with them. For treatment visits, patients cannot have anyone with them due to current Covid guidelines and our immunocompromised population.   Pembrolizumab injection What is this medication? PEMBROLIZUMAB (pem broe liz ue mab) is a monoclonal antibody. It is used to treat certain types of cancer. This medicine may be used for other purposes; ask your health care provider or pharmacist if you have questions. COMMON BRAND NAME(S):  Keytruda What should I tell my care team before I take this medication? They need to know if you have any of these conditions: autoimmune diseases like Crohn's disease, ulcerative colitis, or lupus have had or planning to have an allogeneic stem cell transplant (uses someone else's stem cells) history of  organ transplant history of chest radiation nervous system problems like myasthenia gravis or Guillain-Barre syndrome an unusual or allergic reaction to pembrolizumab, other medicines, foods, dyes, or preservatives pregnant or trying to get pregnant breast-feeding How should I use this medication? This medicine is for infusion into a vein. It is given by a health care professional in a hospital or clinic setting. A special MedGuide will be given to you before each treatment. Be sure to read this information carefully each time. Talk to your pediatrician regarding the use of this medicine in children. While this drug may be prescribed for children as young as 6 months for selected conditions, precautions do apply. Overdosage: If you think you have taken too much of this medicine contact a poison control center or emergency room at once. NOTE: This medicine is only for you. Do not share this medicine with others. What if I miss a dose? It is important not to miss your dose. Call your doctor or health care professional if you are unable to keep an appointment. What may interact with this medication? Interactions have not been studied. This list may not describe all possible interactions. Give your health care provider a list of all the medicines, herbs, non-prescription drugs, or dietary supplements you use. Also tell them if you smoke, drink alcohol, or use illegal drugs. Some items may interact with your medicine. What should I watch for while using this medication? Your condition will be monitored carefully while you are receiving this medicine. You may need blood work done while you are taking this medicine. Do not become pregnant while taking this medicine or for 4 months after stopping it. Women should inform their doctor if they wish to become pregnant or think they might be pregnant. There is a potential for serious side effects to an unborn child. Talk to your health care professional or  pharmacist for more information. Do not breast-feed an infant while taking this medicine or for 4 months after the last dose. What side effects may I notice from receiving this medication? Side effects that you should report to your doctor or health care professional as soon as possible: allergic reactions like skin rash, itching or hives, swelling of the face, lips, or tongue bloody or black, tarry breathing problems changes in vision chest pain chills confusion constipation cough diarrhea dizziness or feeling faint or lightheaded fast or irregular heartbeat fever flushing joint pain low blood counts - this medicine may decrease the number of white blood cells, red blood cells and platelets. You may be at increased risk for infections and bleeding. muscle pain muscle weakness pain, tingling, numbness in the hands or feet persistent headache redness, blistering, peeling or loosening of the skin, including inside the mouth signs and symptoms of high blood sugar such as dizziness; dry mouth; dry skin; fruity breath; nausea; stomach pain; increased hunger or thirst; increased urination signs and symptoms of kidney injury like trouble passing urine or change in the amount of urine signs and symptoms of liver injury like dark urine, light-colored stools, loss of appetite, nausea, right upper belly pain, yellowing of the eyes or skin sweating swollen lymph nodes weight loss Side effects that usually do not  require medical attention (report to your doctor or health care professional if they continue or are bothersome): decreased appetite hair loss tiredness This list may not describe all possible side effects. Call your doctor for medical advice about side effects. You may report side effects to FDA at 1-800-FDA-1088. Where should I keep my medication? This drug is given in a hospital or clinic and will not be stored at home. NOTE: This sheet is a summary. It may not cover all possible  information. If you have questions about this medicine, talk to your doctor, pharmacist, or health care provider.  2022 Elsevier/Gold Standard (2018-11-21 21:44:53)  Pemetrexed injection What is this medication? PEMETREXED (PEM e TREX ed) is a chemotherapy drug used to treat lung cancers like non-small cell lung cancer and mesothelioma. It may also be used to treat other cancers. This medicine may be used for other purposes; ask your health care provider or pharmacist if you have questions. COMMON BRAND NAME(S): Alimta, PEMFEXY What should I tell my care team before I take this medication? They need to know if you have any of these conditions: infection (especially a virus infection such as chickenpox, cold sores, or herpes) kidney disease low blood counts, like low white cell, platelet, or red cell counts lung or breathing disease, like asthma radiation therapy an unusual or allergic reaction to pemetrexed, other medicines, foods, dyes, or preservative pregnant or trying to get pregnant breast-feeding How should I use this medication? This drug is given as an infusion into a vein. It is administered in a hospital or clinic by a specially trained health care professional. Talk to your pediatrician regarding the use of this medicine in children. Special care may be needed. Overdosage: If you think you have taken too much of this medicine contact a poison control center or emergency room at once. NOTE: This medicine is only for you. Do not share this medicine with others. What if I miss a dose? It is important not to miss your dose. Call your doctor or health care professional if you are unable to keep an appointment. What may interact with this medication? This medicine may interact with the following medications: Ibuprofen This list may not describe all possible interactions. Give your health care provider a list of all the medicines, herbs, non-prescription drugs, or dietary supplements  you use. Also tell them if you smoke, drink alcohol, or use illegal drugs. Some items may interact with your medicine. What should I watch for while using this medication? Visit your doctor for checks on your progress. This drug may make you feel generally unwell. This is not uncommon, as chemotherapy can affect healthy cells as well as cancer cells. Report any side effects. Continue your course of treatment even though you feel ill unless your doctor tells you to stop. In some cases, you may be given additional medicines to help with side effects. Follow all directions for their use. Call your doctor or health care professional for advice if you get a fever, chills or sore throat, or other symptoms of a cold or flu. Do not treat yourself. This drug decreases your body's ability to fight infections. Try to avoid being around people who are sick. This medicine may increase your risk to bruise or bleed. Call your doctor or health care professional if you notice any unusual bleeding. Be careful brushing and flossing your teeth or using a toothpick because you may get an infection or bleed more easily. If you have any dental work done,  tell your dentist you are receiving this medicine. Avoid taking products that contain aspirin, acetaminophen, ibuprofen, naproxen, or ketoprofen unless instructed by your doctor. These medicines may hide a fever. Call your doctor or health care professional if you get diarrhea or mouth sores. Do not treat yourself. To protect your kidneys, drink water or other fluids as directed while you are taking this medicine. Do not become pregnant while taking this medicine or for 6 months after stopping it. Women should inform their doctor if they wish to become pregnant or think they might be pregnant. Men should not father a child while taking this medicine and for 3 months after stopping it. This may interfere with the ability to father a child. You should talk to your doctor or health  care professional if you are concerned about your fertility. There is a potential for serious side effects to an unborn child. Talk to your health care professional or pharmacist for more information. Do not breast-feed an infant while taking this medicine or for 1 week after stopping it. What side effects may I notice from receiving this medication? Side effects that you should report to your doctor or health care professional as soon as possible: allergic reactions like skin rash, itching or hives, swelling of the face, lips, or tongue breathing problems redness, blistering, peeling or loosening of the skin, including inside the mouth signs and symptoms of bleeding such as bloody or black, tarry stools; red or dark-brown urine; spitting up blood or brown material that looks like coffee grounds; red spots on the skin; unusual bruising or bleeding from the eye, gums, or nose signs and symptoms of infection like fever or chills; cough; sore throat; pain or trouble passing urine signs and symptoms of kidney injury like trouble passing urine or change in the amount of urine signs and symptoms of liver injury like dark yellow or brown urine; general ill feeling or flu-like symptoms; light-colored stools; loss of appetite; nausea; right upper belly pain; unusually weak or tired; yellowing of the eyes or skin Side effects that usually do not require medical attention (report to your doctor or health care professional if they continue or are bothersome): constipation mouth sores nausea, vomiting unusually weak or tired This list may not describe all possible side effects. Call your doctor for medical advice about side effects. You may report side effects to FDA at 1-800-FDA-1088. Where should I keep my medication? This drug is given in a hospital or clinic and will not be stored at home. NOTE: This sheet is a summary. It may not cover all possible information. If you have questions about this medicine,  talk to your doctor, pharmacist, or health care provider.  2022 Elsevier/Gold Standard (2017-02-08 16:11:33)  Carboplatin injection What is this medication? CARBOPLATIN (KAR boe pla tin) is a chemotherapy drug. It targets fast dividing cells, like cancer cells, and causes these cells to die. This medicine is used to treat ovarian cancer and many other cancers. This medicine may be used for other purposes; ask your health care provider or pharmacist if you have questions. COMMON BRAND NAME(S): Paraplatin What should I tell my care team before I take this medication? They need to know if you have any of these conditions: blood disorders hearing problems kidney disease recent or ongoing radiation therapy an unusual or allergic reaction to carboplatin, cisplatin, other chemotherapy, other medicines, foods, dyes, or preservatives pregnant or trying to get pregnant breast-feeding How should I use this medication? This drug is usually  given as an infusion into a vein. It is administered in a hospital or clinic by a specially trained health care professional. Talk to your pediatrician regarding the use of this medicine in children. Special care may be needed. Overdosage: If you think you have taken too much of this medicine contact a poison control center or emergency room at once. NOTE: This medicine is only for you. Do not share this medicine with others. What if I miss a dose? It is important not to miss a dose. Call your doctor or health care professional if you are unable to keep an appointment. What may interact with this medication? medicines for seizures medicines to increase blood counts like filgrastim, pegfilgrastim, sargramostim some antibiotics like amikacin, gentamicin, neomycin, streptomycin, tobramycin vaccines Talk to your doctor or health care professional before taking any of these medicines: acetaminophen aspirin ibuprofen ketoprofen naproxen This list may not describe  all possible interactions. Give your health care provider a list of all the medicines, herbs, non-prescription drugs, or dietary supplements you use. Also tell them if you smoke, drink alcohol, or use illegal drugs. Some items may interact with your medicine. What should I watch for while using this medication? Your condition will be monitored carefully while you are receiving this medicine. You will need important blood work done while you are taking this medicine. This drug may make you feel generally unwell. This is not uncommon, as chemotherapy can affect healthy cells as well as cancer cells. Report any side effects. Continue your course of treatment even though you feel ill unless your doctor tells you to stop. In some cases, you may be given additional medicines to help with side effects. Follow all directions for their use. Call your doctor or health care professional for advice if you get a fever, chills or sore throat, or other symptoms of a cold or flu. Do not treat yourself. This drug decreases your body's ability to fight infections. Try to avoid being around people who are sick. This medicine may increase your risk to bruise or bleed. Call your doctor or health care professional if you notice any unusual bleeding. Be careful brushing and flossing your teeth or using a toothpick because you may get an infection or bleed more easily. If you have any dental work done, tell your dentist you are receiving this medicine. Avoid taking products that contain aspirin, acetaminophen, ibuprofen, naproxen, or ketoprofen unless instructed by your doctor. These medicines may hide a fever. Do not become pregnant while taking this medicine. Women should inform their doctor if they wish to become pregnant or think they might be pregnant. There is a potential for serious side effects to an unborn child. Talk to your health care professional or pharmacist for more information. Do not breast-feed an infant while  taking this medicine. What side effects may I notice from receiving this medication? Side effects that you should report to your doctor or health care professional as soon as possible: allergic reactions like skin rash, itching or hives, swelling of the face, lips, or tongue signs of infection - fever or chills, cough, sore throat, pain or difficulty passing urine signs of decreased platelets or bleeding - bruising, pinpoint red spots on the skin, black, tarry stools, nosebleeds signs of decreased red blood cells - unusually weak or tired, fainting spells, lightheadedness breathing problems changes in hearing changes in vision chest pain high blood pressure low blood counts - This drug may decrease the number of white blood cells, red blood  cells and platelets. You may be at increased risk for infections and bleeding. nausea and vomiting pain, swelling, redness or irritation at the injection site pain, tingling, numbness in the hands or feet problems with balance, talking, walking trouble passing urine or change in the amount of urine Side effects that usually do not require medical attention (report to your doctor or health care professional if they continue or are bothersome): hair loss loss of appetite metallic taste in the mouth or changes in taste This list may not describe all possible side effects. Call your doctor for medical advice about side effects. You may report side effects to FDA at 1-800-FDA-1088. Where should I keep my medication? This drug is given in a hospital or clinic and will not be stored at home. NOTE: This sheet is a summary. It may not cover all possible information. If you have questions about this medicine, talk to your doctor, pharmacist, or health care provider.  2022 Elsevier/Gold Standard (2007-03-27 14:38:05)

## 2020-11-03 ENCOUNTER — Encounter: Payer: Self-pay | Admitting: General Practice

## 2020-11-03 NOTE — Progress Notes (Signed)
[facility] Spiritual Care Note  Phoned Raymond [name] as promised (after getting interrupted yesterday) to ensure that he has my phone number so that he can reach me directly as needed/desired.   [place], [place], [facility] Pager [phone] Voicemail [phone]

## 2020-11-05 ENCOUNTER — Encounter: Payer: Self-pay | Admitting: General Practice

## 2020-11-05 ENCOUNTER — Inpatient Hospital Stay: Payer: 59 | Attending: Internal Medicine | Admitting: General Practice

## 2020-11-05 DIAGNOSIS — C3412 Malignant neoplasm of upper lobe, left bronchus or lung: Secondary | ICD-10-CM | POA: Insufficient documentation

## 2020-11-05 DIAGNOSIS — Z5111 Encounter for antineoplastic chemotherapy: Secondary | ICD-10-CM | POA: Insufficient documentation

## 2020-11-05 DIAGNOSIS — Z5112 Encounter for antineoplastic immunotherapy: Secondary | ICD-10-CM | POA: Insufficient documentation

## 2020-11-05 DIAGNOSIS — C7931 Secondary malignant neoplasm of brain: Secondary | ICD-10-CM | POA: Insufficient documentation

## 2020-11-05 NOTE — Progress Notes (Signed)
[facility] CSW Progress Notes  Called patient, he is at work today.  Work has not approved his leave yet, he is not receiving short term disability at this time.  He is communicating with his HR department, they are working on his claim.   He has been told that his Medicare that he received through the [employer] program terminated in Nov 2021.  He has applied for insurance through the [employer] which "is supposed to begin 11/03/2020.  He will bring his new card in at the next visit.  He does not know the details of his new insurance.    He was very encouraged by receiving report of his "numbers" being "better than expected."  He does struggle with fatigue and nausea.  He is hoping that his short term disability will be approved soon as he wants to be able to rest as much as he needs.  Will call him in 2 weeks to check in.  Edwyna Shell, LCSW Clinical Social Worker Phone:  [phone]

## 2020-11-09 ENCOUNTER — Ambulatory Visit: Payer: Self-pay

## 2020-11-09 ENCOUNTER — Other Ambulatory Visit: Payer: Self-pay

## 2020-11-09 ENCOUNTER — Inpatient Hospital Stay: Payer: 59

## 2020-11-09 ENCOUNTER — Ambulatory Visit: Payer: Self-pay | Admitting: Internal Medicine

## 2020-11-09 DIAGNOSIS — Z5112 Encounter for antineoplastic immunotherapy: Secondary | ICD-10-CM | POA: Diagnosis present

## 2020-11-09 DIAGNOSIS — Z5111 Encounter for antineoplastic chemotherapy: Secondary | ICD-10-CM | POA: Diagnosis present

## 2020-11-09 DIAGNOSIS — C3412 Malignant neoplasm of upper lobe, left bronchus or lung: Secondary | ICD-10-CM

## 2020-11-09 DIAGNOSIS — C7931 Secondary malignant neoplasm of brain: Secondary | ICD-10-CM | POA: Diagnosis not present

## 2020-11-09 LAB — CBC WITH DIFFERENTIAL (CANCER CENTER ONLY)
Abs Immature Granulocytes: 0.01 10*3/uL (ref 0.00–0.07)
Basophils Absolute: 0 10*3/uL (ref 0.0–0.1)
Basophils Relative: 1 %
Eosinophils Absolute: 0 10*3/uL (ref 0.0–0.5)
Eosinophils Relative: 1 %
HCT: 39.4 % (ref 39.0–52.0)
Hemoglobin: 12.7 g/dL — ABNORMAL LOW (ref 13.0–17.0)
Immature Granulocytes: 0 %
Lymphocytes Relative: 49 %
Lymphs Abs: 1.5 10*3/uL (ref 0.7–4.0)
MCH: 28 pg (ref 26.0–34.0)
MCHC: 32.2 g/dL (ref 30.0–36.0)
MCV: 87 fL (ref 80.0–100.0)
Monocytes Absolute: 0.2 10*3/uL (ref 0.1–1.0)
Monocytes Relative: 6 %
Neutro Abs: 1.3 10*3/uL — ABNORMAL LOW (ref 1.7–7.7)
Neutrophils Relative %: 43 %
Platelet Count: 151 10*3/uL (ref 150–400)
RBC: 4.53 MIL/uL (ref 4.22–5.81)
RDW: 13.2 % (ref 11.5–15.5)
Smear Review: NORMAL
WBC Count: 3.1 10*3/uL — ABNORMAL LOW (ref 4.0–10.5)
nRBC: 0 % (ref 0.0–0.2)

## 2020-11-09 LAB — CMP (CANCER CENTER ONLY)
ALT: 26 U/L (ref 0–44)
AST: 21 U/L (ref 15–41)
Albumin: 3.7 g/dL (ref 3.5–5.0)
Alkaline Phosphatase: 90 U/L (ref 38–126)
Anion gap: 7 (ref 5–15)
BUN: 21 mg/dL — ABNORMAL HIGH (ref 6–20)
CO2: 28 mmol/L (ref 22–32)
Calcium: 9.3 mg/dL (ref 8.9–10.3)
Chloride: 107 mmol/L (ref 98–111)
Creatinine: 0.87 mg/dL (ref 0.61–1.24)
GFR, Estimated: >60 mL/min (ref >60)
Glucose, Bld: 94 mg/dL (ref 70–99)
Potassium: 3.9 mmol/L (ref 3.5–5.1)
Sodium: 142 mmol/L (ref 135–145)
Total Bilirubin: 0.5 mg/dL (ref 0.3–1.2)
Total Protein: 7.2 g/dL (ref 6.5–8.1)

## 2020-11-16 ENCOUNTER — Ambulatory Visit: Payer: Self-pay

## 2020-11-16 ENCOUNTER — Inpatient Hospital Stay: Payer: 59

## 2020-11-16 ENCOUNTER — Other Ambulatory Visit: Payer: Self-pay

## 2020-11-16 DIAGNOSIS — C3412 Malignant neoplasm of upper lobe, left bronchus or lung: Secondary | ICD-10-CM

## 2020-11-16 DIAGNOSIS — Z5111 Encounter for antineoplastic chemotherapy: Secondary | ICD-10-CM | POA: Diagnosis not present

## 2020-11-16 LAB — CMP (CANCER CENTER ONLY)
ALT: 22 U/L (ref 0–44)
AST: 19 U/L (ref 15–41)
Albumin: 3.5 g/dL (ref 3.5–5.0)
Alkaline Phosphatase: 92 U/L (ref 38–126)
Anion gap: 8 (ref 5–15)
BUN: 13 mg/dL (ref 6–20)
CO2: 26 mmol/L (ref 22–32)
Calcium: 9 mg/dL (ref 8.9–10.3)
Chloride: 110 mmol/L (ref 98–111)
Creatinine: 0.88 mg/dL (ref 0.61–1.24)
GFR, Estimated: >60 mL/min (ref >60)
Glucose, Bld: 106 mg/dL — ABNORMAL HIGH (ref 70–99)
Potassium: 4.8 mmol/L (ref 3.5–5.1)
Sodium: 144 mmol/L (ref 135–145)
Total Bilirubin: 0.3 mg/dL (ref 0.3–1.2)
Total Protein: 6.8 g/dL (ref 6.5–8.1)

## 2020-11-16 LAB — CBC WITH DIFFERENTIAL (CANCER CENTER ONLY)
Abs Immature Granulocytes: 0.01 10*3/uL (ref 0.00–0.07)
Basophils Absolute: 0 10*3/uL (ref 0.0–0.1)
Basophils Relative: 1 %
Eosinophils Absolute: 0 10*3/uL (ref 0.0–0.5)
Eosinophils Relative: 1 %
HCT: 37.4 % — ABNORMAL LOW (ref 39.0–52.0)
Hemoglobin: 12.1 g/dL — ABNORMAL LOW (ref 13.0–17.0)
Immature Granulocytes: 0 %
Lymphocytes Relative: 39 %
Lymphs Abs: 1.5 10*3/uL (ref 0.7–4.0)
MCH: 28.5 pg (ref 26.0–34.0)
MCHC: 32.4 g/dL (ref 30.0–36.0)
MCV: 88 fL (ref 80.0–100.0)
Monocytes Absolute: 0.9 10*3/uL (ref 0.1–1.0)
Monocytes Relative: 24 %
Neutro Abs: 1.3 10*3/uL — ABNORMAL LOW (ref 1.7–7.7)
Neutrophils Relative %: 35 %
Platelet Count: 98 10*3/uL — ABNORMAL LOW (ref 150–400)
RBC: 4.25 MIL/uL (ref 4.22–5.81)
RDW: 13.9 % (ref 11.5–15.5)
WBC Count: 3.8 10*3/uL — ABNORMAL LOW (ref 4.0–10.5)
nRBC: 0 % (ref 0.0–0.2)

## 2020-11-16 LAB — TSH: TSH: 0.805 u[IU]/mL (ref 0.320–4.118)

## 2020-11-19 ENCOUNTER — Inpatient Hospital Stay: Payer: 59 | Admitting: General Practice

## 2020-11-19 DIAGNOSIS — C3492 Malignant neoplasm of unspecified part of left bronchus or lung: Secondary | ICD-10-CM

## 2020-11-19 NOTE — Progress Notes (Signed)
[facility] CSW Progress Notes  Call to patient to check in .  He is doing much better, has found others living w lung cancer and has incorporated multiple coping strategies into his life.  Continues to work as this is a source of pleasure for him.  Is connecting with mentors and friends.  Is processing this as a spiritual journey.  Will continue to connect w patient for ongoing support.  Edwyna Shell, LCSW Clinical Social Worker Phone:  [phone]

## 2020-11-20 ENCOUNTER — Other Ambulatory Visit: Payer: Self-pay | Admitting: Physician Assistant

## 2020-11-20 DIAGNOSIS — C3492 Malignant neoplasm of unspecified part of left bronchus or lung: Secondary | ICD-10-CM

## 2020-11-23 ENCOUNTER — Inpatient Hospital Stay (HOSPITAL_BASED_OUTPATIENT_CLINIC_OR_DEPARTMENT_OTHER): Payer: 59 | Admitting: *Deleted

## 2020-11-23 ENCOUNTER — Other Ambulatory Visit: Payer: Self-pay

## 2020-11-23 ENCOUNTER — Other Ambulatory Visit (HOSPITAL_COMMUNITY): Payer: Self-pay

## 2020-11-23 ENCOUNTER — Encounter: Payer: Self-pay | Admitting: Internal Medicine

## 2020-11-23 ENCOUNTER — Encounter: Payer: Self-pay | Admitting: *Deleted

## 2020-11-23 ENCOUNTER — Inpatient Hospital Stay: Payer: 59

## 2020-11-23 ENCOUNTER — Encounter: Payer: Self-pay | Admitting: General Practice

## 2020-11-23 ENCOUNTER — Inpatient Hospital Stay (HOSPITAL_BASED_OUTPATIENT_CLINIC_OR_DEPARTMENT_OTHER): Payer: 59 | Admitting: Internal Medicine

## 2020-11-23 ENCOUNTER — Ambulatory Visit
Admission: RE | Admit: 2020-11-23 | Discharge: 2020-11-23 | Disposition: A | Discharge status: Home / Self Care | Payer: Self-pay | Source: Ambulatory Visit | Attending: Radiation Oncology | Admitting: Radiation Oncology

## 2020-11-23 VITALS — BP 165/90

## 2020-11-23 VITALS — BP 175/101 | HR 79 | Temp 98.1°F | Resp 20 | Wt 293.4 lb

## 2020-11-23 DIAGNOSIS — C3492 Malignant neoplasm of unspecified part of left bronchus or lung: Secondary | ICD-10-CM

## 2020-11-23 DIAGNOSIS — C3412 Malignant neoplasm of upper lobe, left bronchus or lung: Secondary | ICD-10-CM

## 2020-11-23 DIAGNOSIS — C3490 Malignant neoplasm of unspecified part of unspecified bronchus or lung: Secondary | ICD-10-CM

## 2020-11-23 DIAGNOSIS — Z5111 Encounter for antineoplastic chemotherapy: Secondary | ICD-10-CM | POA: Diagnosis not present

## 2020-11-23 LAB — CBC WITH DIFFERENTIAL (CANCER CENTER ONLY)
Abs Immature Granulocytes: 0.09 10*3/uL — ABNORMAL HIGH (ref 0.00–0.07)
Basophils Absolute: 0.1 10*3/uL (ref 0.0–0.1)
Basophils Relative: 1 %
Eosinophils Absolute: 0.1 10*3/uL (ref 0.0–0.5)
Eosinophils Relative: 2 %
HCT: 38.5 % — ABNORMAL LOW (ref 39.0–52.0)
Hemoglobin: 12.7 g/dL — ABNORMAL LOW (ref 13.0–17.0)
Immature Granulocytes: 2 %
Lymphocytes Relative: 33 %
Lymphs Abs: 1.8 10*3/uL (ref 0.7–4.0)
MCH: 29.1 pg (ref 26.0–34.0)
MCHC: 33 g/dL (ref 30.0–36.0)
MCV: 88.3 fL (ref 80.0–100.0)
Monocytes Absolute: 1 10*3/uL (ref 0.1–1.0)
Monocytes Relative: 19 %
Neutro Abs: 2.3 10*3/uL (ref 1.7–7.7)
Neutrophils Relative %: 43 %
Platelet Count: 252 10*3/uL (ref 150–400)
RBC: 4.36 MIL/uL (ref 4.22–5.81)
RDW: 15.4 % (ref 11.5–15.5)
WBC Count: 5.4 10*3/uL (ref 4.0–10.5)
nRBC: 0 % (ref 0.0–0.2)

## 2020-11-23 LAB — CMP (CANCER CENTER ONLY)
ALT: 22 U/L (ref 0–44)
AST: 22 U/L (ref 15–41)
Albumin: 3.5 g/dL (ref 3.5–5.0)
Alkaline Phosphatase: 93 U/L (ref 38–126)
Anion gap: 8 (ref 5–15)
BUN: 11 mg/dL (ref 6–20)
CO2: 26 mmol/L (ref 22–32)
Calcium: 9 mg/dL (ref 8.9–10.3)
Chloride: 109 mmol/L (ref 98–111)
Creatinine: 0.94 mg/dL (ref 0.61–1.24)
GFR, Estimated: >60 mL/min (ref >60)
Glucose, Bld: 107 mg/dL — ABNORMAL HIGH (ref 70–99)
Potassium: 4.1 mmol/L (ref 3.5–5.1)
Sodium: 143 mmol/L (ref 135–145)
Total Bilirubin: <0.2 mg/dL — ABNORMAL LOW (ref 0.3–1.2)
Total Protein: 7 g/dL (ref 6.5–8.1)

## 2020-11-23 MED ORDER — CYANOCOBALAMIN 1000 MCG/ML IJ SOLN
1000.0000 ug | Freq: Once | INTRAMUSCULAR | Status: AC
  Administered 2020-11-23: 1000 ug via INTRAMUSCULAR
  Filled 2020-11-23: qty 1

## 2020-11-23 MED ORDER — LORATADINE 10 MG PO TABS
10.0000 mg | ORAL_TABLET | Freq: Once | ORAL | Status: AC
  Administered 2020-11-23: 10 mg via ORAL
  Filled 2020-11-23: qty 1

## 2020-11-23 MED ORDER — SODIUM CHLORIDE 0.9 % IV SOLN: Freq: Once | INTRAVENOUS | Status: AC

## 2020-11-23 MED ORDER — FOLIC ACID 1 MG PO TABS
1.0000 mg | ORAL_TABLET | Freq: Every day | ORAL | 2 refills | Status: DC
Start: 2020-11-23 — End: 2021-01-27
  Filled 2020-11-23: qty 30, 30d supply, fill #0

## 2020-11-23 MED ORDER — LORATADINE 10 MG PO TABS: 10.0000 mg | ORAL_TABLET | Freq: Every day | ORAL | Status: DC

## 2020-11-23 MED ORDER — SODIUM CHLORIDE 0.9 % IV SOLN
150.0000 mg | Freq: Once | INTRAVENOUS | Status: AC
  Administered 2020-11-23: 150 mg via INTRAVENOUS
  Filled 2020-11-23: qty 150

## 2020-11-23 MED ORDER — SODIUM CHLORIDE 0.9 % IV SOLN
500.0000 mg/m2 | Freq: Once | INTRAVENOUS | Status: AC
  Administered 2020-11-23: 1300 mg via INTRAVENOUS
  Filled 2020-11-23: qty 40

## 2020-11-23 MED ORDER — PALONOSETRON HCL INJECTION 0.25 MG/5ML
0.2500 mg | Freq: Once | INTRAVENOUS | Status: AC
  Administered 2020-11-23: 0.25 mg via INTRAVENOUS
  Filled 2020-11-23: qty 5

## 2020-11-23 MED ORDER — SODIUM CHLORIDE 0.9 % IV SOLN
200.0000 mg | Freq: Once | INTRAVENOUS | Status: AC
  Administered 2020-11-23: 200 mg via INTRAVENOUS
  Filled 2020-11-23: qty 8

## 2020-11-23 MED ORDER — SODIUM CHLORIDE 0.9 % IV SOLN
10.0000 mg | Freq: Once | INTRAVENOUS | Status: AC
  Administered 2020-11-23: 10 mg via INTRAVENOUS
  Filled 2020-11-23: qty 10

## 2020-11-23 MED ORDER — SODIUM CHLORIDE 0.9 % IV SOLN
750.0000 mg | Freq: Once | INTRAVENOUS | Status: AC
  Administered 2020-11-23: 750 mg via INTRAVENOUS
  Filled 2020-11-23: qty 75

## 2020-11-23 NOTE — Progress Notes (Signed)
Oncology Nurse Navigator Documentation  Oncology Nurse Navigator Flowsheets 11/23/2020  Abnormal Finding Date 09/01/2020  Confirmed Diagnosis Date 09/14/2020  Diagnosis Status Confirmed Diagnosis Complete  Planned Course of Treatment Chemotherapy  Phase of Treatment Radiation  Chemotherapy Actual Start Date: 10/05/2020  Radiation Actual Start Date: 10/13/2020  Radiation Actual End Date: 10/21/2020  Navigator Follow Up Date: 11/25/2020  Navigator Follow Up Reason: Other:  Navigator Location [facility]  Navigator Encounter Type Clinic/MDC  Treatment Initiated Date 10/05/2020  Patient Visit Type Follow-up  Treatment Phase Treatment  Barriers/Navigation Needs Education;Healthcare System Knowledge Deficit;No Forensic scientist with Insurance Coverage  Interventions Education;Psycho-Social Support;Referrals  Acuity Level 2-Minimal Needs (1-2 Barriers Identified)  Referrals Other  Education Method Verbal;Written  Time Spent with Patient 30

## 2020-11-23 NOTE — Progress Notes (Signed)
Oncology Nurse Navigator Documentation  Oncology Nurse Navigator Flowsheets 11/23/2020  Abnormal Finding Date 09/01/2020  Confirmed Diagnosis Date 09/14/2020  Diagnosis Status Confirmed Diagnosis Complete  Planned Course of Treatment Chemotherapy  Phase of Treatment Radiation  Chemotherapy Actual Start Date: 10/05/2020  Radiation Actual Start Date: 10/13/2020  Radiation Actual End Date: 10/21/2020  Navigator Follow Up Date: 11/25/2020  Navigator Follow Up Reason: Other:  Navigator Location [facility]  Navigator Encounter Type Clinic/MDC  Treatment Initiated Date 10/05/2020  Patient Visit Type Follow-up/spoke to Raymond [name] today during his visit to see Dr. Julien Nordmann. Due to him not having insurance and not enough money for medication, I gave him information on servent center for assistance. He was thankful for the help. I also explained the need for him to take his prescribed medication and the reason for taking them.    Treatment Phase Treatment  Barriers/Navigation Needs Education;Healthcare System Knowledge Deficit;No Forensic scientist with Insurance Coverage  Interventions Education;Psycho-Social Support;Referrals  Acuity Level 2-Minimal Needs (1-2 Barriers Identified)  Referrals Other  Education Method Verbal;Written  Time Spent with Patient 30

## 2020-11-23 NOTE — Progress Notes (Signed)
[facility] Spiritual Care Note  Raymond [name] was struggling with hip pain and discouragement today in infusion. He values chaplain visits as opportunities for venting, reflecting, and receiving affirmation and encouragement. As he has noted before, he is a very solutions-focused person, and this treatment process is a challenge that he can't think himself through. He does use transferable skills for coping that he has learned through other life challenges (reframing, naming and claiming strengths, expressing feelings, practicing perspective and gratitude, etc). Provided pastoral presence and reflection, affirmation of strengths, normalization of feelings, and emotional support. We plan to follow up at his next treatment.   [place], [place], [facility] Pager [phone] Voicemail [phone]

## 2020-11-23 NOTE — Patient Instructions (Signed)
[facility] ONCOLOGY  Discharge Instructions: Thank you for choosing [facility] to provide your oncology and hematology care.   If you have a lab appointment with the [facility], please go directly to the [facility] and check in at the registration area.   Wear comfortable clothing and clothing appropriate for easy access to any Portacath or PICC line.   We strive to give you quality time with your provider. You may need to reschedule your appointment if you arrive late (15 or more minutes).  Arriving late affects you and other patients whose appointments are after yours.  Also, if you miss three or more appointments without notifying the office, you may be dismissed from the clinic at the provider's discretion.      For prescription refill requests, have your pharmacy contact our office and allow 72 hours for refills to be completed.    Today you received the following chemotherapy and/or immunotherapy agents Keytruda, alimta, and carboplatin      To help prevent nausea and vomiting after your treatment, we encourage you to take your nausea medication as directed.  BELOW ARE SYMPTOMS THAT SHOULD BE REPORTED IMMEDIATELY: *FEVER GREATER THAN 100.4 F (38 C) OR HIGHER *CHILLS OR SWEATING *NAUSEA AND VOMITING THAT IS NOT CONTROLLED WITH YOUR NAUSEA MEDICATION *UNUSUAL SHORTNESS OF BREATH *UNUSUAL BRUISING OR BLEEDING *URINARY PROBLEMS (pain or burning when urinating, or frequent urination) *BOWEL PROBLEMS (unusual diarrhea, constipation, pain near the anus) TENDERNESS IN MOUTH AND THROAT WITH OR WITHOUT PRESENCE OF ULCERS (sore throat, sores in mouth, or a toothache) UNUSUAL RASH, SWELLING OR PAIN  UNUSUAL VAGINAL DISCHARGE OR ITCHING   Items with * indicate a potential emergency and should be followed up as soon as possible or go to the Emergency Department if any problems should occur.  Please show the CHEMOTHERAPY ALERT CARD or IMMUNOTHERAPY  ALERT CARD at check-in to the Emergency Department and triage nurse.  Should you have questions after your visit or need to cancel or reschedule your appointment, please contact [facility]  Dept: [phone]  and follow the prompts.  Office hours are 8:00 a.m. to 4:30 p.m. Monday - Friday. Please note that voicemails left after 4:00 p.m. may not be returned until the following business day.  We are closed weekends and major holidays. You have access to a nurse at all times for urgent questions. Please call the main number to the clinic Dept: [phone] and follow the prompts.   For any non-urgent questions, you may also contact your provider using MyChart. We now offer e-Visits for anyone 98 and older to request care online for non-urgent symptoms. For details visit [email or web address].   Also download the MyChart app! Go to the app store, search "MyChart", open the app, select [facility], and log in with your MyChart username and password.  Due to Covid, a mask is required upon entering the hospital/clinic. If you do not have a mask, one will be given to you upon arrival. For doctor visits, patients may have 1 support person aged 70 or older with them. For treatment visits, patients cannot have anyone with them due to current Covid guidelines and our immunocompromised population.

## 2020-11-23 NOTE — Progress Notes (Signed)
[place] [facility] [phone]   [phone]  OFFICE PROGRESS NOTE  Pcp, No No address on file  DIAGNOSIS: Stage IV (T2b, N2, M1c) non-small cell lung cancer, adenocarcinoma presented with left upper lobe lung mass in addition to left hilar and mediastinal lymphadenopathy diagnosed in September 2022. He also has metastatic disease to the brain.    Molecular Studies: No actionable mutations on tissue tested by [facility]  PD-L1 expression 10%.   PRIOR THERAPY: 1) SRS to the metastatic brain lesions under the care of Dr. Lisbeth Renshaw Scheduled for 10/21/20   CURRENT THERAPY: 1) Carboplatin for an AUC of 5, Alimta 500 mg per metered squared, Keytruda 200 mg IV every 3 weeks. First dose on 10/12/20. Status post 2 cycles  INTERVAL HISTORY: Raymond [name] 60 y.o. male returns to the clinic today for follow-up visit.  The patient is feeling fine today with no concerning complaints except for mild fatigue.  He was unable to take his blood pressure medication as prescribed because he ran out of the medication.  He denied having any significant chest pain, shortness of breath, cough or hemoptysis.  He denied having any fever or chills.  He has no nausea, vomiting, diarrhea or constipation.  He has no headache or visual changes.  He denied having any recent weight loss or night sweats.  He is here today for evaluation before starting cycle #3 of his treatment.  MEDICAL HISTORY: Past Medical History:  Diagnosis Date   Arthritis    Baker's cyst    right   Diverticulitis    GERD (gastroesophageal reflux disease)    Heart attack ([place]) 01/04/2004   History of hiatal hernia    Hypertension    Malignant neoplasm of upper lobe of left lung ([facility]) 09/24/2020   Pneumonia    as a child   Sleep apnea    no longer on Cpap    ALLERGIES:  is allergic to benadryl [diphenhydramine].  MEDICATIONS:  Current Outpatient Medications  Medication Sig Dispense Refill    acetaminophen (TYLENOL) 650 MG CR tablet Take 650 mg by mouth every 8 (eight) hours as needed for pain.     ciprofloxacin (CIPRO) 500 MG tablet Take 1 tablet (500 mg total) by mouth 2 (two) times daily. 20 tablet 0   etodolac (LODINE) 500 MG tablet Take 500 mg by mouth 2 (two) times daily.     Ferrous Sulfate (IRON) 325 (65 Fe) MG TABS Take 1 tablet by mouth daily.     fluconazole (DIFLUCAN) 100 MG tablet Take 1 tablet (100 mg total) by mouth daily. 10 tablet 0   folic acid (FOLVITE) 1 MG tablet Take 1 tablet (1 mg total) by mouth daily. 30 tablet 2   gabapentin (NEURONTIN) 600 MG tablet Take 600 mg by mouth at bedtime.     losartan-hydrochlorothiazide (HYZAAR) 50-12.5 MG tablet Take 1 tablet by mouth daily.     metroNIDAZOLE (FLAGYL) 500 MG tablet Take 1 tablet (500 mg total) by mouth 3 (three) times daily. 30 tablet 0   mirtazapine (REMERON) 15 MG tablet Take 1 tablet (15 mg total) by mouth at bedtime. 30 tablet 2   Misc Natural Products (OSTEO BI-FLEX ADV JOINT SHIELD) TABS Take 1 tablet by mouth daily.     MULTIPLE VITAMIN PO Take 1 tablet by mouth daily.     Multiple Vitamins-Minerals (MENS MULTIVITAMIN PO) Take 1 tablet by mouth daily.     omeprazole (PRILOSEC) 10 MG capsule Take  10 mg by mouth as needed.     oxyCODONE-acetaminophen (PERCOCET) 5-325 MG tablet Take 1-2 tablets by mouth every 4 (four) hours as needed for severe pain. 15 tablet 0   Probiotic Product (PROBIOTIC DAILY PO) Take 1 capsule by mouth daily.     prochlorperazine (COMPAZINE) 10 MG tablet Take 1 tablet (10 mg total) by mouth every 6 (six) hours as needed for nausea or vomiting. 30 tablet 0   Wheat Dextrin (BENEFIBER DRINK MIX) PACK Take by mouth.     No current facility-administered medications for this visit.    SURGICAL HISTORY:  Past Surgical History:  Procedure Laterality Date   BRONCHIAL BIOPSY  09/14/2020   Procedure: BRONCHIAL BIOPSIES;  Surgeon: Raymond Peer, MD;  Location: [facility] ENDOSCOPY;  Service:  Cardiopulmonary;;   BRONCHIAL BRUSHINGS  09/14/2020   Procedure: BRONCHIAL BRUSHINGS;  Surgeon: Raymond Peer, MD;  Location: [facility] ENDOSCOPY;  Service: Cardiopulmonary;;   BRONCHIAL NEEDLE ASPIRATION BIOPSY  09/14/2020   Procedure: BRONCHIAL NEEDLE ASPIRATION BIOPSIES;  Surgeon: Raymond Peer, MD;  Location: [facility] ENDOSCOPY;  Service: Cardiopulmonary;;   CARDIAC CATHETERIZATION     CARPAL TUNNEL RELEASE     right   FIDUCIAL MARKER PLACEMENT  09/14/2020   Procedure: FIDUCIAL MARKER PLACEMENT;  Surgeon: Raymond Peer, MD;  Location: [facility] ENDOSCOPY;  Service: Cardiopulmonary;;   HAND SURGERY     left   KNEE SURGERY     left   VIDEO BRONCHOSCOPY N/A 09/14/2020   Procedure: ROBOTIC VIDEO BRONCHOSCOPY WITH FLUORO;  Surgeon: Raymond Peer, MD;  Location: [facility] ENDOSCOPY;  Service: Cardiopulmonary;  Laterality: N/A;   VIDEO BRONCHOSCOPY WITH ENDOBRONCHIAL ULTRASOUND N/A 09/14/2020   Procedure: VIDEO BRONCHOSCOPY WITH ENDOBRONCHIAL ULTRASOUND;  Surgeon: Raymond Peer, MD;  Location: [facility] ENDOSCOPY;  Service: Cardiopulmonary;  Laterality: N/A;   VIDEO BRONCHOSCOPY WITH RADIAL ENDOBRONCHIAL ULTRASOUND  09/14/2020   Procedure: VIDEO BRONCHOSCOPY WITH RADIAL ENDOBRONCHIAL ULTRASOUND;  Surgeon: Raymond Peer, MD;  Location: [facility] ENDOSCOPY;  Service: Cardiopulmonary;;    REVIEW OF SYSTEMS:  A comprehensive review of systems was negative except for: Constitutional: positive for fatigue   PHYSICAL EXAMINATION: General appearance: alert, cooperative, fatigued, and no distress Head: Normocephalic, without obvious abnormality, atraumatic Neck: no adenopathy, no JVD, supple, symmetrical, trachea midline, and thyroid not enlarged, symmetric, no tenderness/mass/nodules Lymph nodes: Cervical, supraclavicular, and axillary nodes normal. Resp: clear to auscultation bilaterally Back: symmetric, no curvature. ROM normal. No CVA tenderness. Cardio: regular rate and rhythm, S1, S2 normal, no murmur, click, rub or  gallop GI: soft, non-tender; bowel sounds normal; no masses,  no organomegaly Extremities: extremities normal, atraumatic, no cyanosis or edema  ECOG PERFORMANCE STATUS: 1 - Symptomatic but completely ambulatory  Blood pressure (!) 175/101, pulse 79, temperature 98.1 F (36.7 C), resp. rate 20, weight 293 lb 6.4 oz (133.1 kg), SpO2 99 %.  LABORATORY DATA: Lab Results  Component Value Date   WBC 5.4 11/23/2020   HGB 12.7 (L) 11/23/2020   HCT 38.5 (L) 11/23/2020   MCV 88.3 11/23/2020   PLT 252 11/23/2020      Chemistry      Component Value Date/Time   NA 144 11/16/2020 1102   K 4.8 11/16/2020 1102   CL 110 11/16/2020 1102   CO2 26 11/16/2020 1102   BUN 13 11/16/2020 1102   CREATININE 0.88 11/16/2020 1102      Component Value Date/Time   CALCIUM 9.0 11/16/2020 1102   ALKPHOS 92 11/16/2020 1102   AST 19 11/16/2020 1102  ALT 22 11/16/2020 1102   BILITOT 0.3 11/16/2020 1102       RADIOGRAPHIC STUDIES: No results found.  ASSESSMENT AND PLAN: This is a very pleasant 61 year old African-American male with stage IV (T2b, N2, M1 C) non-small cell lung cancer, adenocarcinoma.  He presented with a left upper lobe lung mass in addition to left hilar and mediastinal lymphadenopathy.  He had also had a small left iliac osseous lesion and metastatic disease to the brain.  He was diagnosed in September 2022.  PD-L1 expression 10%.  [facility] does not have enough circulating tumor and his tissue was negative for any actionable mutations.  The patient is currently undergoing palliative systemic chemotherapy with carboplatin for an AUC of 5, Alimta 500 mg per metered squared, Keytruda 200 mg IV every 3 weeks.  Status post 2 cycles.  The patient has been tolerating this treatment well with no concerning adverse effects. I recommended for him to proceed with cycle #3 today as planned. I will see him back for follow-up visit in 3 weeks for evaluation with repeat CT scan of the chest, abdomen  and pelvis for restaging of his disease. The patient was advised to take his blood pressure medication as prescribed and we gave him information about agency to help with his medication. He was advised to call immediately if he has any other concerning symptoms in the interval. The patient voices understanding of current disease status and treatment options and is in agreement with the current care plan.  All questions were answered. The patient knows to call the clinic with any problems, questions or concerns. We can certainly see the patient much sooner if necessary. The total time spent in the appointment was 20 minutes.  Disclaimer: This note was dictated with voice recognition software. Similar sounding words can inadvertently be transcribed and may not be corrected upon review.

## 2020-11-24 NOTE — Progress Notes (Signed)
  Radiation Oncology         [phone] ________________________________  Name: Raymond [name] MRN: [id number]  Date of Service: 11/23/2020  DOB: 07/02/1960  Post Treatment Telephone Note  Diagnosis:   Stage IV, cT2bN2M1c, NSCLC, adenocarcinoma of the left lower lobe.  Interval Since Last Radiation:  5 weeks   10/21/2020 through 10/21/2020 Site Technique Total Dose (Gy) Dose per Fx (Gy) Completed Fx Beam Energies  Brain: Brain   PTV_1_48mm PTV_2_84mm  PTV_3_1mm  PTV_4_72mm  PTV_5_6mm PTV_6_18mm  PTV_7_37mm PTV_8_60mm PTV_9_26mm  PTV_10_23mm  PTV_11_58mm  PTV_12_21mm  PTV_13_4mm  PTV_14_66mm  IMRT 20/20 20 1/1 6XFFF    Narrative:  The patient was contacted today for routine follow-up. During treatment he did very well with radiotherapy and did not have significant desquamation. He reports he is doing well with his chemo.  Impression/Plan: 1. Stage IV, cT2bN2M1c, NSCLC, adenocarcinoma of the left lower lobe.. The patient has been doing well since completion of radiotherapy. We discussed that we would plan to proceed with another MRI of his brain and review in brain oncology conference. He will also continue to follow up with Dr. Julien Nordmann in medical oncology as he continues chemo and immunotherapy.      Carola Rhine, PAC

## 2020-11-30 ENCOUNTER — Encounter: Payer: Self-pay | Admitting: Internal Medicine

## 2020-11-30 ENCOUNTER — Other Ambulatory Visit: Payer: Self-pay

## 2020-11-30 ENCOUNTER — Other Ambulatory Visit (HOSPITAL_COMMUNITY): Payer: Self-pay

## 2020-11-30 ENCOUNTER — Telehealth: Payer: Self-pay | Admitting: Medical Oncology

## 2020-11-30 ENCOUNTER — Inpatient Hospital Stay: Payer: 59

## 2020-11-30 DIAGNOSIS — C3492 Malignant neoplasm of unspecified part of left bronchus or lung: Secondary | ICD-10-CM

## 2020-11-30 DIAGNOSIS — Z5111 Encounter for antineoplastic chemotherapy: Secondary | ICD-10-CM | POA: Diagnosis not present

## 2020-11-30 LAB — CMP (CANCER CENTER ONLY)
ALT: 23 U/L (ref 0–44)
AST: 20 U/L (ref 15–41)
Albumin: 3.5 g/dL (ref 3.5–5.0)
Alkaline Phosphatase: 79 U/L (ref 38–126)
Anion gap: 9 (ref 5–15)
BUN: 19 mg/dL (ref 6–20)
CO2: 25 mmol/L (ref 22–32)
Calcium: 9 mg/dL (ref 8.9–10.3)
Chloride: 107 mmol/L (ref 98–111)
Creatinine: 0.96 mg/dL (ref 0.61–1.24)
GFR, Estimated: >60 mL/min (ref >60)
Glucose, Bld: 119 mg/dL — ABNORMAL HIGH (ref 70–99)
Potassium: 3.8 mmol/L (ref 3.5–5.1)
Sodium: 141 mmol/L (ref 135–145)
Total Bilirubin: 0.7 mg/dL (ref 0.3–1.2)
Total Protein: 7 g/dL (ref 6.5–8.1)

## 2020-11-30 LAB — CBC WITH DIFFERENTIAL (CANCER CENTER ONLY)
Abs Immature Granulocytes: 0.01 10*3/uL (ref 0.00–0.07)
Basophils Absolute: 0 10*3/uL (ref 0.0–0.1)
Basophils Relative: 1 %
Eosinophils Absolute: 0.1 10*3/uL (ref 0.0–0.5)
Eosinophils Relative: 2 %
HCT: 36.6 % — ABNORMAL LOW (ref 39.0–52.0)
Hemoglobin: 12 g/dL — ABNORMAL LOW (ref 13.0–17.0)
Immature Granulocytes: 0 %
Lymphocytes Relative: 46 %
Lymphs Abs: 1.3 10*3/uL (ref 0.7–4.0)
MCH: 29.1 pg (ref 26.0–34.0)
MCHC: 32.8 g/dL (ref 30.0–36.0)
MCV: 88.8 fL (ref 80.0–100.0)
Monocytes Absolute: 0.2 10*3/uL (ref 0.1–1.0)
Monocytes Relative: 8 %
Neutro Abs: 1.2 10*3/uL — ABNORMAL LOW (ref 1.7–7.7)
Neutrophils Relative %: 43 %
Platelet Count: 149 10*3/uL — ABNORMAL LOW (ref 150–400)
RBC: 4.12 MIL/uL — ABNORMAL LOW (ref 4.22–5.81)
RDW: 14.6 % (ref 11.5–15.5)
WBC Count: 2.8 10*3/uL — ABNORMAL LOW (ref 4.0–10.5)
nRBC: 0 % (ref 0.0–0.2)

## 2020-11-30 NOTE — Telephone Encounter (Signed)
Pt Raymond [name] asking for Dr Julien Nordmann to prescribe a medicine for his  " diverticulitis".  I Raymond [name] on pts phonethat Dr Julien Nordmann does not prescribe  med for diverticulitis and I told him to contact provider who made the diagnosis.

## 2020-12-03 ENCOUNTER — Encounter: Payer: Self-pay | Admitting: Internal Medicine

## 2020-12-03 ENCOUNTER — Telehealth: Payer: Self-pay

## 2020-12-03 NOTE — Progress Notes (Signed)
  Radiation Oncology         [phone] ________________________________  Name: Raymond [name] MRN: [id number]  Date: 10/21/2020  DOB: 1960-04-21  End of Treatment Note  Diagnosis:   brain metastasis     Indication for treatment:  palliative       Radiation treatment dates:   10/21/20  Site/dose:    The patient was treated in a single fraction to a dose of 20 Gray.  Simultaneously, a total of 14 subcentimeter metastases were treated using a 7 field approach.  Narrative: The patient tolerated radiation treatment well.   There were no signs of acute toxicity after treatment.  Plan: The patient has completed radiation treatment. The patient will return to radiation oncology clinic for routine followup in one month. I advised the patient to call or return sooner if they have any questions or concerns related to their recovery or treatment. ________________________________  ------------------------------------------------  Jodelle Gross, MD, PhD

## 2020-12-03 NOTE — Telephone Encounter (Signed)
Pt states he is having a flare up of diverticulitis and requesting to be prescribed pain medication and antibiotics.

## 2020-12-03 NOTE — Telephone Encounter (Signed)
I called and spoke with pt to advise that Dr. Julien Nordmann does not prescribe his rx's for diverticulitis and to please follow-up with his PCP of GI provider. Pt expressed understanding of this information and states he will contact his PCP.

## 2020-12-07 ENCOUNTER — Inpatient Hospital Stay: Payer: Self-pay | Attending: Internal Medicine

## 2020-12-07 ENCOUNTER — Other Ambulatory Visit: Payer: Self-pay

## 2020-12-07 DIAGNOSIS — K5900 Constipation, unspecified: Secondary | ICD-10-CM | POA: Insufficient documentation

## 2020-12-07 DIAGNOSIS — R590 Localized enlarged lymph nodes: Secondary | ICD-10-CM | POA: Insufficient documentation

## 2020-12-07 DIAGNOSIS — F418 Other specified anxiety disorders: Secondary | ICD-10-CM | POA: Insufficient documentation

## 2020-12-07 DIAGNOSIS — Z5111 Encounter for antineoplastic chemotherapy: Secondary | ICD-10-CM | POA: Insufficient documentation

## 2020-12-07 DIAGNOSIS — B370 Candidal stomatitis: Secondary | ICD-10-CM | POA: Insufficient documentation

## 2020-12-07 DIAGNOSIS — I10 Essential (primary) hypertension: Secondary | ICD-10-CM | POA: Insufficient documentation

## 2020-12-07 DIAGNOSIS — C3492 Malignant neoplasm of unspecified part of left bronchus or lung: Secondary | ICD-10-CM

## 2020-12-07 DIAGNOSIS — C3412 Malignant neoplasm of upper lobe, left bronchus or lung: Secondary | ICD-10-CM

## 2020-12-07 DIAGNOSIS — C7931 Secondary malignant neoplasm of brain: Secondary | ICD-10-CM | POA: Insufficient documentation

## 2020-12-07 DIAGNOSIS — Z5112 Encounter for antineoplastic immunotherapy: Secondary | ICD-10-CM | POA: Insufficient documentation

## 2020-12-07 DIAGNOSIS — Z79899 Other long term (current) drug therapy: Secondary | ICD-10-CM | POA: Insufficient documentation

## 2020-12-07 LAB — CBC WITH DIFFERENTIAL (CANCER CENTER ONLY)
Abs Immature Granulocytes: 0.01 10*3/uL (ref 0.00–0.07)
Basophils Absolute: 0 10*3/uL (ref 0.0–0.1)
Basophils Relative: 1 %
Eosinophils Absolute: 0 10*3/uL (ref 0.0–0.5)
Eosinophils Relative: 1 %
HCT: 33.9 % — ABNORMAL LOW (ref 39.0–52.0)
Hemoglobin: 11.2 g/dL — ABNORMAL LOW (ref 13.0–17.0)
Immature Granulocytes: 0 %
Lymphocytes Relative: 34 %
Lymphs Abs: 1.3 10*3/uL (ref 0.7–4.0)
MCH: 29.2 pg (ref 26.0–34.0)
MCHC: 33 g/dL (ref 30.0–36.0)
MCV: 88.5 fL (ref 80.0–100.0)
Monocytes Absolute: 0.8 10*3/uL (ref 0.1–1.0)
Monocytes Relative: 23 %
Neutro Abs: 1.5 10*3/uL — ABNORMAL LOW (ref 1.7–7.7)
Neutrophils Relative %: 41 %
Platelet Count: 103 10*3/uL — ABNORMAL LOW (ref 150–400)
RBC: 3.83 MIL/uL — ABNORMAL LOW (ref 4.22–5.81)
RDW: 15.5 % (ref 11.5–15.5)
WBC Count: 3.7 10*3/uL — ABNORMAL LOW (ref 4.0–10.5)
nRBC: 0 % (ref 0.0–0.2)

## 2020-12-07 LAB — CMP (CANCER CENTER ONLY)
ALT: 17 U/L (ref 0–44)
AST: 17 U/L (ref 15–41)
Albumin: 3.2 g/dL — ABNORMAL LOW (ref 3.5–5.0)
Alkaline Phosphatase: 83 U/L (ref 38–126)
Anion gap: 6 (ref 5–15)
BUN: 13 mg/dL (ref 6–20)
CO2: 26 mmol/L (ref 22–32)
Calcium: 8.7 mg/dL — ABNORMAL LOW (ref 8.9–10.3)
Chloride: 109 mmol/L (ref 98–111)
Creatinine: 0.86 mg/dL (ref 0.61–1.24)
GFR, Estimated: >60 mL/min (ref >60)
Glucose, Bld: 121 mg/dL — ABNORMAL HIGH (ref 70–99)
Potassium: 3.8 mmol/L (ref 3.5–5.1)
Sodium: 141 mmol/L (ref 135–145)
Total Bilirubin: 0.3 mg/dL (ref 0.3–1.2)
Total Protein: 6.8 g/dL (ref 6.5–8.1)

## 2020-12-07 LAB — TSH: TSH: 0.917 u[IU]/mL (ref 0.320–4.118)

## 2020-12-11 ENCOUNTER — Telehealth: Payer: Self-pay | Admitting: Physician Assistant

## 2020-12-11 NOTE — Progress Notes (Signed)
[facility] OFFICE PROGRESS NOTE  Pcp, No No address on file  DIAGNOSIS: Stage IV (T2b, N2, M1c) non-small cell lung cancer, adenocarcinoma presented with left upper lobe lung mass in addition to left hilar and mediastinal lymphadenopathy diagnosed in September 2022. He also has metastatic disease to the brain.    Molecular Studies: No actionable mutations   PRIOR THERAPY: 1) SRS to the metastatic brain lesions under the care of Dr. Lisbeth Renshaw Scheduled for 10/21/20  CURRENT THERAPY: 1) Carboplatin for an AUC of 5, Alimta 500 mg per metered squared, Keytruda 200 mg IV every 3 weeks. First dose on 10/12/20. Status post 3 cycles.   INTERVAL HISTORY: Raymond [name] 60 y.o. male returns to the clinic today for a follow-up visit.  The patient is feeling well today without any concerning complaints except for taste alterations. He has evidence of thrush today. He was frustrated yesterday because after church he went to eat and food did not taste good which was depressing for him as he takes enjoyment out of eating.  He has some stress/depression from his recent cancer diagnosis. He was previously referred to psychiatrist but lack of insurance has made obtaining assistance difficult.  The patient is followed closely by the social worker and chaplain as the patient had some challenges adjusting to the diagnosis.  He was also previously given information to the lung cancer support groups.  The patient is currently on Remeron for appetite and depression.  He is currently undergoing palliative systemic chemotherapy and immunotherapy. Otherwise he is tolerating his treatment fairly well except for fatigue and taste changes.   In the interval since his last appointment, the patient has a history of diverticulitis and had some abdominal discomfort which he thought was from diverticulitis.  He ended up taking Ex-Lax which helped constipation and it relieved his abdominal pain.  The patient denies any  fever, abdominal pain, blood in the stool, or diarrhea or constipation at this time.  The patient denies any fever, chills, night sweats, or unexplained weight loss.  He denies any chest pain, cough, or hemoptysis.  He sometimes has mild dyspnea on exertion which he previously attributed to anxiety.  Denies any changes in his breathing today.  He had some nausea and one episode of vomiting in the interval since his last appointment.  Denies any rashes or skin changes.  The patient follows closely with radiation oncology for his history of metastatic disease to the brain.  The patient was supposed to have a restaging CT scan prior to his appointment today but this had not been scheduled.  I called the patient on Friday, 12/11/2020 to help him facilitate getting this scheduled.  This is now scheduled for 12/17/20.  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 He is wondering if he can get a work note to show that he is able to perform his other work responsibilities on days that he does not receive treatment.  The patient is here today for evaluation and repeat blood work before considering starting cycle #4.    MEDICAL HISTORY: Past Medical History:  Diagnosis Date   Arthritis    Baker's cyst    right   Diverticulitis    GERD (gastroesophageal reflux disease)    Heart attack ([facility]) 01/04/2004   History of hiatal hernia    Hypertension    Malignant neoplasm of upper lobe of left lung ([facility]) 09/24/2020   Pneumonia  as a child   Sleep apnea    no longer on Cpap    ALLERGIES:  is allergic to benadryl [diphenhydramine].  MEDICATIONS:  Current Outpatient Medications  Medication Sig Dispense Refill   acetaminophen (TYLENOL) 650 MG CR tablet Take 650 mg by mouth every 8 (eight) hours as needed for pain.     etodolac (LODINE) 500 MG tablet Take 500 mg by mouth 2 (two) times daily.     Ferrous Sulfate (IRON) 325 (65 Fe) MG TABS Take 1 tablet by mouth daily.      fluconazole (DIFLUCAN) 100 MG tablet Take 1 tablet  by mouth daily. 10 tablet 0   folic acid (FOLVITE) 1 MG tablet Take 1 tablet (1 mg total) by mouth daily. 30 tablet 2   gabapentin (NEURONTIN) 600 MG tablet Take 600 mg by mouth at bedtime.     losartan-hydrochlorothiazide (HYZAAR) 50-12.5 MG tablet Take 1 tablet by mouth daily.     Misc Natural Products (OSTEO BI-FLEX ADV JOINT SHIELD) TABS Take 1 tablet by mouth daily.     Multiple Vitamins-Minerals (MENS MULTIVITAMIN PO) Take 1 tablet by mouth daily.     omeprazole (PRILOSEC) 10 MG capsule Take 10 mg by mouth as needed.     Probiotic Product (PROBIOTIC DAILY PO) Take 1 capsule by mouth daily.     prochlorperazine (COMPAZINE) 10 MG tablet Take 1 tablet (10 mg total) by mouth every 6 (six) hours as needed for nausea or vomiting. 30 tablet 0   simethicone (MYLICON) 270 MG chewable tablet Chew 125 mg by mouth every 6 (six) hours as needed for flatulence.     Wheat Dextrin (BENEFIBER DRINK MIX) PACK Take by mouth.     mirtazapine (REMERON) 15 MG tablet Take 1 tablet by mouth at bedtime. 30 tablet 2   No current facility-administered medications for this visit.    SURGICAL HISTORY:  Past Surgical History:  Procedure Laterality Date   BRONCHIAL BIOPSY  09/14/2020   Procedure: BRONCHIAL BIOPSIES;  Surgeon: Collene Gobble, MD;  Location: [facility] ENDOSCOPY;  Service: Cardiopulmonary;;   BRONCHIAL BRUSHINGS  09/14/2020   Procedure: BRONCHIAL BRUSHINGS;  Surgeon: Collene Gobble, MD;  Location: [facility];  Service: Cardiopulmonary;;   BRONCHIAL NEEDLE ASPIRATION BIOPSY  09/14/2020   Procedure: BRONCHIAL NEEDLE ASPIRATION BIOPSIES;  Surgeon: Collene Gobble, MD;  Location: [facility];  Service: Cardiopulmonary;;   CARDIAC CATHETERIZATION     CARPAL TUNNEL RELEASE     right   FIDUCIAL MARKER PLACEMENT  09/14/2020   Procedure: FIDUCIAL MARKER PLACEMENT;  Surgeon: Collene Gobble, MD;  Location: [facility];  Service: Cardiopulmonary;;   HAND  SURGERY     left   KNEE SURGERY     left   VIDEO BRONCHOSCOPY N/A 09/14/2020   Procedure: ROBOTIC VIDEO BRONCHOSCOPY WITH FLUORO;  Surgeon: Collene Gobble, MD;  Location: [facility];  Service: Cardiopulmonary;  Laterality: N/A;   VIDEO BRONCHOSCOPY WITH ENDOBRONCHIAL ULTRASOUND N/A 09/14/2020   Procedure: VIDEO BRONCHOSCOPY WITH ENDOBRONCHIAL ULTRASOUND;  Surgeon: Collene Gobble, MD;  Location: [facility];  Service: Cardiopulmonary;  Laterality: N/A;   VIDEO BRONCHOSCOPY WITH RADIAL ENDOBRONCHIAL ULTRASOUND  09/14/2020   Procedure: VIDEO BRONCHOSCOPY WITH RADIAL ENDOBRONCHIAL ULTRASOUND;  Surgeon: Collene Gobble, MD;  Location: [facility] ENDOSCOPY;  Service: Cardiopulmonary;;    REVIEW OF SYSTEMS:   Review of Systems  Constitutional: Positive for fatigue.  Negative for appetite change, chills, fever and unexpected weight change.  HENT: Positive for taste alterations/thrush.  Negative for mouth  sores, nosebleeds, sore throat and trouble swallowing.   Eyes: Negative for eye problems and icterus.  Respiratory: Positive for mild dyspnea occasionally related to anxiety.  Negative for cough, hemoptysis, shortness of breath and wheezing.   Cardiovascular: Negative for chest pain and leg swelling.  Gastrointestinal: Negative for abdominal pain, constipation (resolved), diarrhea, nausea and vomiting (resolved).  Genitourinary: Negative for bladder incontinence, difficulty urinating, dysuria, frequency and hematuria.   Musculoskeletal: Negative for back pain, gait problem, neck pain and neck stiffness.  Skin: Negative for itching and rash.  Neurological: Negative for dizziness, extremity weakness, gait problem, headaches, light-headedness and seizures.  Hematological: Negative for adenopathy. Does not bruise/bleed easily.  Psychiatric/Behavioral: Negative for confusion, depression and sleep disturbance. The patient is not nervous/anxious.     PHYSICAL EXAMINATION:  Blood pressure (!) 160/96, pulse  87, temperature 98.5 F (36.9 C), temperature source Tympanic, resp. rate 17, weight 292 lb 6 oz (132.6 kg), SpO2 99 %.  ECOG PERFORMANCE STATUS: 1  Physical Exam  Constitutional: Oriented to person, place, and time and well-developed, well-nourished, and in no distress.  HENT:  Head: Normocephalic and atraumatic.  Mouth/Throat: Oropharynx is clear and moist. No oropharyngeal exudate.  Eyes: Conjunctivae are normal. Right eye exhibits no discharge. Left eye exhibits no discharge. No scleral icterus.  Neck: Normal range of motion. Neck supple.  Cardiovascular: Normal rate, regular rhythm, normal heart sounds and intact distal pulses.   Pulmonary/Chest: Effort normal and breath sounds normal. No respiratory distress. No wheezes. No rales.  Abdominal: Soft. Bowel sounds are normal. Exhibits no distension and no mass. There is no tenderness.  Musculoskeletal: Normal range of motion. Exhibits no edema.  Lymphadenopathy:    No cervical adenopathy.  Neurological: Alert and oriented to person, place, and time. Exhibits normal muscle tone. Gait normal. Coordination normal.  Skin: Skin is warm and dry. No rash noted. Not diaphoretic. No erythema. No pallor.  Psychiatric: Mood, memory and judgment normal.  Vitals reviewed.  LABORATORY DATA: Lab Results  Component Value Date   WBC 5.1 12/14/2020   HGB 11.8 (L) 12/14/2020   HCT 35.9 (L) 12/14/2020   MCV 89.8 12/14/2020   PLT 263 12/14/2020      Chemistry      Component Value Date/Time   NA 142 12/14/2020 1039   K 4.0 12/14/2020 1039   CL 109 12/14/2020 1039   CO2 23 12/14/2020 1039   BUN 12 12/14/2020 1039   CREATININE 0.84 12/14/2020 1039      Component Value Date/Time   CALCIUM 9.1 12/14/2020 1039   ALKPHOS 83 12/14/2020 1039   AST 20 12/14/2020 1039   ALT 17 12/14/2020 1039   BILITOT 0.3 12/14/2020 1039       RADIOGRAPHIC STUDIES:  No results found.   ASSESSMENT/PLAN:  This is a very pleasant 60 year old  African-American male with stage IV (T2b, N2, M1 C) non-small cell lung cancer, adenocarcinoma.  He presented with a left upper lobe lung mass in addition to left hilar and mediastinal lymphadenopathy.  He had also had a small left iliac osseous lesion and metastatic disease to the brain.  He was diagnosed in September 2022.  PD-L1 expression 10%.  Guardant 360 does not have enough circulating tumor and his tissue was negative for any actionable mutations.    The patient is currently undergoing palliative systemic chemotherapy with carboplatin for an AUC of 5, Alimta 500 mg per metered squared, Keytruda 200 mg IV every 3 weeks.  The patient status post 3 cycles tolerated  it fairly well.    He completed SRS to the metastatic brain lesion under the care of Dr. Lisbeth Renshaw in October 2022.   The patient was supposed to have a restaging CT scan prior to his appointment today which has not been performed at this time.  This has been scheduled for 12/17/20.    Labs were reviewed. Recommend that he continue on the same treatment at the same dose.  We will proceed with cycle #4 today.  We will see him back for follow-up visit in 3 weeks for evaluation before starting cycle #5.  I sent in a refill of his Remeron.  He is going to establish care with a new primary care provider tomorrow.  I asked him to discuss antihypertensive management as well as any other additional recommendations for his depression and anxiety.  Encourage the patient to continue with the support groups.   Regarding the patient's taste alterations, he does have evidence of thrush on exam today.  I have sent in a 10-day course of Diflucan to take 1 tablet p.o. daily.  I also encouraged good oral hygiene including salt water rinses and Biotene.  I will discuss the patient's request for a note for work with Dr. Julien Nordmann and will get back with the patient later today.  I reviewed constipation management/education with the patient.  Advised him to  use a stool softener to prevent constipation.  If he ever goes 2 days more than his normal bowel habits, I advised him to use a laxative.     He will continue taking Remeron for his depression, decreased appetite, and insomnia.  I also have previously referred him to a psychiatrist but there is challenges to access due to the patient's lack of insurance.  The patient has been following closely with the chaplain as well as Education officer, museum regarding his psychosocial needs.     The patient was advised to call immediately if he has any concerning symptoms in the interval. The patient voices understanding of current disease status and treatment options and is in agreement with the current care plan. All questions were answered. The patient knows to call the clinic with any problems, questions or concerns. We can certainly see the patient much sooner if necessary    No orders of the defined types were placed in this encounter.    The total time spent in the appointment was 20-29 minutes.   Remie Mathison L Taelor Moncada, PA-C 12/14/20

## 2020-12-11 NOTE — Telephone Encounter (Signed)
I called the patient.  I am expected to see him on Monday, 12/14/2020.  I noticed that the patient's restaging CT scan has not been scheduled at this time.  I called the patient and gave him detailed instructions on how to call radiology scheduling to schedule this at his earliest convenience.  He will call them upon hanging up.  We will see him next week as scheduled and will not delay treatment.

## 2020-12-14 ENCOUNTER — Inpatient Hospital Stay (HOSPITAL_BASED_OUTPATIENT_CLINIC_OR_DEPARTMENT_OTHER): Payer: Self-pay | Admitting: Physician Assistant

## 2020-12-14 ENCOUNTER — Ambulatory Visit: Payer: Self-pay | Admitting: Family

## 2020-12-14 ENCOUNTER — Inpatient Hospital Stay: Payer: Self-pay

## 2020-12-14 ENCOUNTER — Encounter: Payer: Self-pay | Admitting: Internal Medicine

## 2020-12-14 ENCOUNTER — Other Ambulatory Visit: Payer: Self-pay

## 2020-12-14 ENCOUNTER — Other Ambulatory Visit (HOSPITAL_COMMUNITY): Payer: Self-pay

## 2020-12-14 VITALS — BP 160/96 | HR 87 | Temp 98.5°F | Resp 17 | Wt 292.4 lb

## 2020-12-14 VITALS — BP 164/90 | HR 80

## 2020-12-14 DIAGNOSIS — C3412 Malignant neoplasm of upper lobe, left bronchus or lung: Secondary | ICD-10-CM

## 2020-12-14 DIAGNOSIS — Z5111 Encounter for antineoplastic chemotherapy: Secondary | ICD-10-CM

## 2020-12-14 DIAGNOSIS — C3492 Malignant neoplasm of unspecified part of left bronchus or lung: Secondary | ICD-10-CM

## 2020-12-14 DIAGNOSIS — R630 Anorexia: Secondary | ICD-10-CM

## 2020-12-14 DIAGNOSIS — B379 Candidiasis, unspecified: Secondary | ICD-10-CM

## 2020-12-14 LAB — CMP (CANCER CENTER ONLY)
ALT: 17 U/L (ref 0–44)
AST: 20 U/L (ref 15–41)
Albumin: 3.4 g/dL — ABNORMAL LOW (ref 3.5–5.0)
Alkaline Phosphatase: 83 U/L (ref 38–126)
Anion gap: 10 (ref 5–15)
BUN: 12 mg/dL (ref 6–20)
CO2: 23 mmol/L (ref 22–32)
Calcium: 9.1 mg/dL (ref 8.9–10.3)
Chloride: 109 mmol/L (ref 98–111)
Creatinine: 0.84 mg/dL (ref 0.61–1.24)
GFR, Estimated: >60 mL/min (ref >60)
Glucose, Bld: 103 mg/dL — ABNORMAL HIGH (ref 70–99)
Potassium: 4 mmol/L (ref 3.5–5.1)
Sodium: 142 mmol/L (ref 135–145)
Total Bilirubin: 0.3 mg/dL (ref 0.3–1.2)
Total Protein: 7.2 g/dL (ref 6.5–8.1)

## 2020-12-14 LAB — CBC WITH DIFFERENTIAL (CANCER CENTER ONLY)
Abs Immature Granulocytes: 0.05 10*3/uL (ref 0.00–0.07)
Basophils Absolute: 0 10*3/uL (ref 0.0–0.1)
Basophils Relative: 0 %
Eosinophils Absolute: 0.1 10*3/uL (ref 0.0–0.5)
Eosinophils Relative: 1 %
HCT: 35.9 % — ABNORMAL LOW (ref 39.0–52.0)
Hemoglobin: 11.8 g/dL — ABNORMAL LOW (ref 13.0–17.0)
Immature Granulocytes: 1 %
Lymphocytes Relative: 28 %
Lymphs Abs: 1.4 10*3/uL (ref 0.7–4.0)
MCH: 29.5 pg (ref 26.0–34.0)
MCHC: 32.9 g/dL (ref 30.0–36.0)
MCV: 89.8 fL (ref 80.0–100.0)
Monocytes Absolute: 0.9 10*3/uL (ref 0.1–1.0)
Monocytes Relative: 17 %
Neutro Abs: 2.6 10*3/uL (ref 1.7–7.7)
Neutrophils Relative %: 53 %
Platelet Count: 263 10*3/uL (ref 150–400)
RBC: 4 MIL/uL — ABNORMAL LOW (ref 4.22–5.81)
RDW: 16 % — ABNORMAL HIGH (ref 11.5–15.5)
WBC Count: 5.1 10*3/uL (ref 4.0–10.5)
nRBC: 0 % (ref 0.0–0.2)

## 2020-12-14 LAB — TSH: TSH: 1.015 u[IU]/mL (ref 0.320–4.118)

## 2020-12-14 MED ORDER — PALONOSETRON HCL INJECTION 0.25 MG/5ML
0.2500 mg | Freq: Once | INTRAVENOUS | Status: AC
  Administered 2020-12-14: 0.25 mg via INTRAVENOUS
  Filled 2020-12-14: qty 5

## 2020-12-14 MED ORDER — LORATADINE 10 MG PO TABS
10.0000 mg | ORAL_TABLET | Freq: Once | ORAL | Status: AC
  Administered 2020-12-14: 10 mg via ORAL
  Filled 2020-12-14: qty 1

## 2020-12-14 MED ORDER — SODIUM CHLORIDE 0.9 % IV SOLN
750.0000 mg | Freq: Once | INTRAVENOUS | Status: AC
  Administered 2020-12-14: 750 mg via INTRAVENOUS
  Filled 2020-12-14: qty 75

## 2020-12-14 MED ORDER — SODIUM CHLORIDE 0.9 % IV SOLN
500.0000 mg/m2 | Freq: Once | INTRAVENOUS | Status: AC
  Administered 2020-12-14: 1300 mg via INTRAVENOUS
  Filled 2020-12-14: qty 40

## 2020-12-14 MED ORDER — FLUCONAZOLE 100 MG PO TABS
100.0000 mg | ORAL_TABLET | Freq: Every day | ORAL | 0 refills | Status: DC
Start: 2020-12-14 — End: 2021-01-06
  Filled 2020-12-14: qty 10, 10d supply, fill #0

## 2020-12-14 MED ORDER — SODIUM CHLORIDE 0.9 % IV SOLN
10.0000 mg | Freq: Once | INTRAVENOUS | Status: AC
  Administered 2020-12-14: 10 mg via INTRAVENOUS
  Filled 2020-12-14: qty 10

## 2020-12-14 MED ORDER — SODIUM CHLORIDE 0.9 % IV SOLN
200.0000 mg | Freq: Once | INTRAVENOUS | Status: AC
  Administered 2020-12-14: 200 mg via INTRAVENOUS
  Filled 2020-12-14: qty 8

## 2020-12-14 MED ORDER — SODIUM CHLORIDE 0.9 % IV SOLN
150.0000 mg | Freq: Once | INTRAVENOUS | Status: AC
  Administered 2020-12-14: 150 mg via INTRAVENOUS
  Filled 2020-12-14: qty 150

## 2020-12-14 MED ORDER — SODIUM CHLORIDE 0.9 % IV SOLN: Freq: Once | INTRAVENOUS | Status: AC

## 2020-12-14 MED ORDER — MIRTAZAPINE 15 MG PO TABS
15.0000 mg | ORAL_TABLET | Freq: Every day | ORAL | 2 refills | Status: DC
  Filled 2020-12-14: qty 30, 30d supply, fill #0

## 2020-12-14 NOTE — Patient Instructions (Signed)
[facility]  Discharge Instructions: Thank you for choosing [facility] to provide your oncology and hematology care.   If you have a lab appointment with the [facility], please go directly to the [facility] and check in at the registration area.   Wear comfortable clothing and clothing appropriate for easy access to any Portacath or PICC line.   We strive to give you quality time with your provider. You may need to reschedule your appointment if you arrive late (15 or more minutes).  Arriving late affects you and other patients whose appointments are after yours.  Also, if you miss three or more appointments without notifying the office, you may be dismissed from the clinic at the provider's discretion.      For prescription refill requests, have your pharmacy contact our office and allow 72 hours for refills to be completed.    Today you received the following chemotherapy and/or immunotherapy agents Keytruda, Alimta, and Carboplatin      To help prevent nausea and vomiting after your treatment, we encourage you to take your nausea medication as directed.  BELOW ARE SYMPTOMS THAT SHOULD BE REPORTED IMMEDIATELY: *FEVER GREATER THAN 100.4 F (38 C) OR HIGHER *CHILLS OR SWEATING *NAUSEA AND VOMITING THAT IS NOT CONTROLLED WITH YOUR NAUSEA MEDICATION *UNUSUAL SHORTNESS OF BREATH *UNUSUAL BRUISING OR BLEEDING *URINARY PROBLEMS (pain or burning when urinating, or frequent urination) *BOWEL PROBLEMS (unusual diarrhea, constipation, pain near the anus) TENDERNESS IN MOUTH AND THROAT WITH OR WITHOUT PRESENCE OF ULCERS (sore throat, sores in mouth, or a toothache) UNUSUAL RASH, SWELLING OR PAIN  UNUSUAL VAGINAL DISCHARGE OR ITCHING   Items with * indicate a potential emergency and should be followed up as soon as possible or go to the Emergency Department if any problems should occur.  Please show the CHEMOTHERAPY ALERT CARD or IMMUNOTHERAPY  ALERT CARD at check-in to the Emergency Department and triage nurse.  Should you have questions after your visit or need to cancel or reschedule your appointment, please contact [facility]  Dept: [phone]  and follow the prompts.  Office hours are 8:00 a.m. to 4:30 p.m. Monday - Friday. Please note that voicemails left after 4:00 p.m. may not be returned until the following business day.  We are closed weekends and major holidays. You have access to a nurse at all times for urgent questions. Please call the main number to the clinic Dept: [phone] and follow the prompts.   For any non-urgent questions, you may also contact your provider using MyChart. We now offer e-Visits for anyone 83 and older to request care online for non-urgent symptoms. For details visit [email or web address].   Also download the MyChart app! Go to the app store, search "MyChart", open the app, select [facility], and log in with your MyChart username and password.  Due to Covid, a mask is required upon entering the hospital/clinic. If you do not have a mask, one will be given to you upon arrival. For doctor visits, patients may have 1 support person aged 19 or older with them. For treatment visits, patients cannot have anyone with them due to current Covid guidelines and our immunocompromised population.

## 2020-12-15 ENCOUNTER — Ambulatory Visit: Payer: Self-pay | Admitting: Family

## 2020-12-16 ENCOUNTER — Telehealth: Payer: Self-pay | Admitting: Medical Oncology

## 2020-12-16 NOTE — Telephone Encounter (Signed)
Pt.notified

## 2020-12-16 NOTE — Telephone Encounter (Signed)
-----   Message from Curt Bears, MD sent at 12/07/2020  9:39 PM EST ----- Regarding: RE: 'No restriction " letter for his job He Is currently receiving induction chemo.  I may be able to approve the no restriction letter after he finished the first 4 cycles of his chemotherapy when he is on maintenance therapy.  I will not be able to approve it right now.  Thank you. ----- Message ----- From: Ardeen Garland, RN Sent: 12/07/2020   4:59 PM EST To: Curt Bears, MD Subject: 'No restriction " letter for his job           Will you approve and "No restrictions" letter for his job?

## 2020-12-17 ENCOUNTER — Other Ambulatory Visit: Payer: Self-pay

## 2020-12-17 ENCOUNTER — Ambulatory Visit (HOSPITAL_COMMUNITY)
Admission: RE | Admit: 2020-12-17 | Discharge: 2020-12-17 | Disposition: A | Discharge status: Home / Self Care | Payer: Self-pay | Source: Ambulatory Visit | Attending: Internal Medicine | Admitting: Internal Medicine

## 2020-12-17 DIAGNOSIS — C3490 Malignant neoplasm of unspecified part of unspecified bronchus or lung: Secondary | ICD-10-CM | POA: Insufficient documentation

## 2020-12-17 MED ORDER — SODIUM CHLORIDE (PF) 0.9 % IJ SOLN
INTRAMUSCULAR | Status: AC
  Filled 2020-12-17: qty 50

## 2020-12-17 MED ORDER — IOHEXOL 350 MG/ML SOLN
80.0000 mL | Freq: Once | INTRAVENOUS | Status: AC | PRN
  Administered 2020-12-17: 80 mL via INTRAVENOUS

## 2020-12-20 ENCOUNTER — Other Ambulatory Visit: Payer: Self-pay

## 2020-12-21 ENCOUNTER — Inpatient Hospital Stay: Payer: Self-pay

## 2020-12-21 ENCOUNTER — Encounter: Payer: Self-pay | Admitting: Internal Medicine

## 2020-12-21 ENCOUNTER — Other Ambulatory Visit: Payer: Self-pay

## 2020-12-21 ENCOUNTER — Inpatient Hospital Stay (HOSPITAL_BASED_OUTPATIENT_CLINIC_OR_DEPARTMENT_OTHER): Payer: Self-pay | Admitting: Internal Medicine

## 2020-12-21 VITALS — BP 173/96 | HR 96 | Temp 97.3°F | Resp 20 | Ht 70.0 in | Wt 285.6 lb

## 2020-12-21 DIAGNOSIS — C3492 Malignant neoplasm of unspecified part of left bronchus or lung: Secondary | ICD-10-CM

## 2020-12-21 DIAGNOSIS — C3412 Malignant neoplasm of upper lobe, left bronchus or lung: Secondary | ICD-10-CM

## 2020-12-21 DIAGNOSIS — Z5111 Encounter for antineoplastic chemotherapy: Secondary | ICD-10-CM

## 2020-12-21 LAB — CMP (CANCER CENTER ONLY)
ALT: 19 U/L (ref 0–44)
AST: 21 U/L (ref 15–41)
Albumin: 3.6 g/dL (ref 3.5–5.0)
Alkaline Phosphatase: 89 U/L (ref 38–126)
Anion gap: 9 (ref 5–15)
BUN: 15 mg/dL (ref 6–20)
CO2: 25 mmol/L (ref 22–32)
Calcium: 9 mg/dL (ref 8.9–10.3)
Chloride: 108 mmol/L (ref 98–111)
Creatinine: 0.89 mg/dL (ref 0.61–1.24)
GFR, Estimated: >60 mL/min (ref >60)
Glucose, Bld: 109 mg/dL — ABNORMAL HIGH (ref 70–99)
Potassium: 4.2 mmol/L (ref 3.5–5.1)
Sodium: 142 mmol/L (ref 135–145)
Total Bilirubin: 0.7 mg/dL (ref 0.3–1.2)
Total Protein: 7.3 g/dL (ref 6.5–8.1)

## 2020-12-21 LAB — CBC WITH DIFFERENTIAL (CANCER CENTER ONLY)
Abs Immature Granulocytes: 0.01 10*3/uL (ref 0.00–0.07)
Basophils Absolute: 0 10*3/uL (ref 0.0–0.1)
Basophils Relative: 1 %
Eosinophils Absolute: 0.1 10*3/uL (ref 0.0–0.5)
Eosinophils Relative: 2 %
HCT: 35.8 % — ABNORMAL LOW (ref 39.0–52.0)
Hemoglobin: 12 g/dL — ABNORMAL LOW (ref 13.0–17.0)
Immature Granulocytes: 0 %
Lymphocytes Relative: 35 %
Lymphs Abs: 1.1 10*3/uL (ref 0.7–4.0)
MCH: 30.2 pg (ref 26.0–34.0)
MCHC: 33.5 g/dL (ref 30.0–36.0)
MCV: 89.9 fL (ref 80.0–100.0)
Monocytes Absolute: 0.3 10*3/uL (ref 0.1–1.0)
Monocytes Relative: 9 %
Neutro Abs: 1.6 10*3/uL — ABNORMAL LOW (ref 1.7–7.7)
Neutrophils Relative %: 53 %
Platelet Count: 141 10*3/uL — ABNORMAL LOW (ref 150–400)
RBC: 3.98 MIL/uL — ABNORMAL LOW (ref 4.22–5.81)
RDW: 15 % (ref 11.5–15.5)
WBC Count: 3.1 10*3/uL — ABNORMAL LOW (ref 4.0–10.5)
nRBC: 0 % (ref 0.0–0.2)

## 2020-12-21 LAB — TSH: TSH: 2.023 u[IU]/mL (ref 0.320–4.118)

## 2020-12-21 NOTE — Patient Instructions (Signed)
Steps to Quit Smoking Smoking tobacco is the leading cause of preventable death. It can affect almost every organ in the body. Smoking puts you and people around you at risk for many serious, long-lasting (chronic) diseases. Quitting smoking can be hard, but it is one of the best things that you can do for your health. It is never too late to quit. How do I get ready to quit? When you decide to quit smoking, make a plan to help you succeed. Before you quit: Pick a date to quit. Set a date within the next 2 weeks to give you time to prepare. Write down the reasons why you are quitting. Keep this list in places where you will see it often. Tell your family, friends, and co-workers that you are quitting. Their support is important. Talk with your doctor about the choices that may help you quit. Find out if your health insurance will pay for these treatments. Know the people, places, things, and activities that make you want to smoke (triggers). Avoid them. What first steps can I take to quit smoking? Throw away all cigarettes at home, at work, and in your car. Throw away the things that you use when you smoke, such as ashtrays and lighters. Clean your car. Make sure to empty the ashtray. Clean your home, including curtains and carpets. What can I do to help me quit smoking? Talk with your doctor about taking medicines and seeing a counselor at the same time. You are more likely to succeed when you do both. If you are pregnant or breastfeeding, talk with your doctor about counseling or other ways to quit smoking. Do not take medicine to help you quit smoking unless your doctor tells you to do so. To quit smoking: Quit right away Quit smoking totally, instead of slowly cutting back on how much you smoke over a period of time. Go to counseling. You are more likely to quit if you go to counseling sessions regularly. Take medicine You may take medicines to help you quit. Some medicines need a  prescription, and some you can buy over-the-counter. Some medicines may contain a drug called nicotine to replace the nicotine in cigarettes. Medicines may: Help you to stop having the desire to smoke (cravings). Help to stop the problems that come when you stop smoking (withdrawal symptoms). Your doctor may ask you to use: Nicotine patches, gum, or lozenges. Nicotine inhalers or sprays. Non-nicotine medicine that is taken by mouth. Find resources Find resources and other ways to help you quit smoking and remain smoke-free after you quit. These resources are most helpful when you use them often. They include: Online chats with a counselor. Phone quitlines. Printed self-help materials. Support groups or group counseling. Text messaging programs. Mobile phone apps. Use apps on your mobile phone or tablet that can help you stick to your quit plan. There are many free apps for mobile phones and tablets as well as websites. Examples include Quit Guide from the CDC and smokefree.gov  What things can I do to make it easier to quit?  Talk to your family and friends. Ask them to support and encourage you. Call a phone quitline (1-800-QUIT-NOW), reach out to support groups, or work with a counselor. Ask people who smoke to not smoke around you. Avoid places that make you want to smoke, such as: Bars. Parties. Smoke-break areas at work. Spend time with people who do not smoke. Lower the stress in your life. Stress can make you want to   smoke. Try these things to help your stress: Getting regular exercise. Doing deep-breathing exercises. Doing yoga. Meditating. Doing a body scan. To do this, close your eyes, focus on one area of your body at a time from head to toe. Notice which parts of your body are tense. Try to relax the muscles in those areas. How will I feel when I quit smoking? Day 1 to 3 weeks Within the first 24 hours, you may start to have some problems that come from quitting tobacco.  These problems are very bad 2-3 days after you quit, but they do not often last for more than 2-3 weeks. You may get these symptoms: Mood swings. Feeling restless, nervous, angry, or annoyed. Trouble concentrating. Dizziness. Strong desire for high-sugar foods and nicotine. Weight gain. Trouble pooping (constipation). Feeling like you may vomit (nausea). Coughing or a sore throat. Changes in how the medicines that you take for other issues work in your body. Depression. Trouble sleeping (insomnia). Week 3 and afterward After the first 2-3 weeks of quitting, you may start to notice more positive results, such as: Better sense of smell and taste. Less coughing and sore throat. Slower heart rate. Lower blood pressure. Clearer skin. Better breathing. Fewer sick days. Quitting smoking can be hard. Do not give up if you fail the first time. Some people need to try a few times before they succeed. Do your best to stick to your quit plan, and talk with your doctor if you have any questions or concerns. Summary Smoking tobacco is the leading cause of preventable death. Quitting smoking can be hard, but it is one of the best things that you can do for your health. When you decide to quit smoking, make a plan to help you succeed. Quit smoking right away, not slowly over a period of time. When you start quitting, seek help from your doctor, family, or friends. This information is not intended to replace advice given to you by your health care provider. Make sure you discuss any questions you have with your health care provider. Document Revised: 08/28/2020 Document Reviewed: 03/10/2018 Elsevier Patient Education  2022 [employer]  

## 2020-12-21 NOTE — Progress Notes (Signed)
[facility] [phone]   [phone]  OFFICE PROGRESS NOTE  Pcp, No No address on file  DIAGNOSIS: Stage IV (T2b, N2, M1c) non-small cell lung cancer, adenocarcinoma presented with left upper lobe lung mass in addition to left hilar and mediastinal lymphadenopathy diagnosed in September 2022. He also has metastatic disease to the brain.    Molecular Studies: No actionable mutations on tissue tested by [facility]  PD-L1 expression 10%.   PRIOR THERAPY: 1) SRS to the metastatic brain lesions under the care of Dr. Lisbeth Renshaw Scheduled for 10/21/20   CURRENT THERAPY: 1) Carboplatin for an AUC of 5, Alimta 500 mg per metered squared, Keytruda 200 mg IV every 3 weeks. First dose on 10/12/20. Status post 4 cycles  INTERVAL HISTORY: Raymond [name] 60 y.o. male returns to the clinic today for follow-up visit.  The patient is feeling fine today with no concerning complaints except for fatigue.  He has some good days and bad days  with his treatment.  He denied having any current chest pain, shortness of breath, cough or hemoptysis.  He denied having any fever or chills.  He has no nausea, vomiting, diarrhea or constipation.  He has no headache or visual changes.  He started cycle #4 of his treatment last week.  He had repeat CT scan of the chest, abdomen pelvis performed recently and he is here for evaluation and discussion of his scan results.   MEDICAL HISTORY: Past Medical History:  Diagnosis Date   Arthritis    Baker's cyst    right   Diverticulitis    GERD (gastroesophageal reflux disease)    Heart attack ([facility]) 01/04/2004   History of hiatal hernia    Hypertension    Malignant neoplasm of upper lobe of left lung ([facility]) 09/24/2020   Pneumonia    as a child   Sleep apnea    no longer on Cpap    ALLERGIES:  is allergic to benadryl [diphenhydramine].  MEDICATIONS:  Current Outpatient Medications  Medication Sig Dispense Refill   acetaminophen  (TYLENOL) 650 MG CR tablet Take 650 mg by mouth every 8 (eight) hours as needed for pain.     etodolac (LODINE) 500 MG tablet Take 500 mg by mouth 2 (two) times daily.     Ferrous Sulfate (IRON) 325 (65 Fe) MG TABS Take 1 tablet by mouth daily.     fluconazole (DIFLUCAN) 100 MG tablet Take 1 tablet  by mouth daily. 10 tablet 0   folic acid (FOLVITE) 1 MG tablet Take 1 tablet (1 mg total) by mouth daily. 30 tablet 2   gabapentin (NEURONTIN) 600 MG tablet Take 600 mg by mouth at bedtime.     losartan-hydrochlorothiazide (HYZAAR) 50-12.5 MG tablet Take 1 tablet by mouth daily.     mirtazapine (REMERON) 15 MG tablet Take 1 tablet by mouth at bedtime. 30 tablet 2   Misc Natural Products (OSTEO BI-FLEX ADV JOINT SHIELD) TABS Take 1 tablet by mouth daily.     Multiple Vitamins-Minerals (MENS MULTIVITAMIN PO) Take 1 tablet by mouth daily.     omeprazole (PRILOSEC) 10 MG capsule Take 10 mg by mouth as needed.     Probiotic Product (PROBIOTIC DAILY PO) Take 1 capsule by mouth daily.     prochlorperazine (COMPAZINE) 10 MG tablet Take 1 tablet (10 mg total) by mouth every 6 (six) hours as needed for nausea or vomiting. 30 tablet 0   simethicone (MYLICON) 756 MG chewable  tablet Chew 125 mg by mouth every 6 (six) hours as needed for flatulence.     Wheat Dextrin (BENEFIBER DRINK MIX) PACK Take by mouth.     No current facility-administered medications for this visit.    SURGICAL HISTORY:  Past Surgical History:  Procedure Laterality Date   BRONCHIAL BIOPSY  09/14/2020   Procedure: BRONCHIAL BIOPSIES;  Surgeon: Collene Gobble, MD;  Location: [facility] ENDOSCOPY;  Service: Cardiopulmonary;;   BRONCHIAL BRUSHINGS  09/14/2020   Procedure: BRONCHIAL BRUSHINGS;  Surgeon: Collene Gobble, MD;  Location: [facility];  Service: Cardiopulmonary;;   BRONCHIAL NEEDLE ASPIRATION BIOPSY  09/14/2020   Procedure: BRONCHIAL NEEDLE ASPIRATION BIOPSIES;  Surgeon: Collene Gobble, MD;  Location: [facility];  Service:  Cardiopulmonary;;   CARDIAC CATHETERIZATION     CARPAL TUNNEL RELEASE     right   FIDUCIAL MARKER PLACEMENT  09/14/2020   Procedure: FIDUCIAL MARKER PLACEMENT;  Surgeon: Collene Gobble, MD;  Location: [facility];  Service: Cardiopulmonary;;   HAND SURGERY     left   KNEE SURGERY     left   VIDEO BRONCHOSCOPY N/A 09/14/2020   Procedure: ROBOTIC VIDEO BRONCHOSCOPY WITH FLUORO;  Surgeon: Collene Gobble, MD;  Location: [facility];  Service: Cardiopulmonary;  Laterality: N/A;   VIDEO BRONCHOSCOPY WITH ENDOBRONCHIAL ULTRASOUND N/A 09/14/2020   Procedure: VIDEO BRONCHOSCOPY WITH ENDOBRONCHIAL ULTRASOUND;  Surgeon: Collene Gobble, MD;  Location: [facility];  Service: Cardiopulmonary;  Laterality: N/A;   VIDEO BRONCHOSCOPY WITH RADIAL ENDOBRONCHIAL ULTRASOUND  09/14/2020   Procedure: VIDEO BRONCHOSCOPY WITH RADIAL ENDOBRONCHIAL ULTRASOUND;  Surgeon: Collene Gobble, MD;  Location: [facility] ENDOSCOPY;  Service: Cardiopulmonary;;    REVIEW OF SYSTEMS:  Constitutional: positive for fatigue Eyes: negative Ears, nose, mouth, throat, and face: negative Respiratory: positive for dyspnea on exertion Cardiovascular: negative Gastrointestinal: negative Genitourinary:negative Integument/breast: negative Hematologic/lymphatic: negative Musculoskeletal:negative Neurological: negative Behavioral/Psych: negative Endocrine: negative Allergic/Immunologic: negative   PHYSICAL EXAMINATION: General appearance: alert, cooperative, fatigued, and no distress Head: Normocephalic, without obvious abnormality, atraumatic Neck: no adenopathy, no JVD, supple, symmetrical, trachea midline, and thyroid not enlarged, symmetric, no tenderness/mass/nodules Lymph nodes: Cervical, supraclavicular, and axillary nodes normal. Resp: clear to auscultation bilaterally Back: symmetric, no curvature. ROM normal. No CVA tenderness. Cardio: regular rate and rhythm, S1, S2 normal, no murmur, click, rub or gallop GI: soft,  non-tender; bowel sounds normal; no masses,  no organomegaly Extremities: extremities normal, atraumatic, no cyanosis or edema Neurologic: Alert and oriented X 3, normal strength and tone. Normal symmetric reflexes. Normal coordination and gait  ECOG PERFORMANCE STATUS: 1 - Symptomatic but completely ambulatory  Blood pressure (!) 173/96, pulse 96, temperature (!) 97.3 F (36.3 C), temperature source Tympanic, resp. rate 20, height _0  (1.778 m), weight 285 lb 9.6 oz (129.5 kg), SpO2 98 %.  LABORATORY DATA: Lab Results  Component Value Date   WBC 3.1 (L) 12/21/2020   HGB 12.0 (L) 12/21/2020   HCT 35.8 (L) 12/21/2020   MCV 89.9 12/21/2020   PLT 141 (L) 12/21/2020      Chemistry      Component Value Date/Time   NA 142 12/14/2020 1039   K 4.0 12/14/2020 1039   CL 109 12/14/2020 1039   CO2 23 12/14/2020 1039   BUN 12 12/14/2020 1039   CREATININE 0.84 12/14/2020 1039      Component Value Date/Time   CALCIUM 9.1 12/14/2020 1039   ALKPHOS 83 12/14/2020 1039   AST 20 12/14/2020 1039   ALT 17 12/14/2020 1039  BILITOT 0.3 12/14/2020 1039       RADIOGRAPHIC STUDIES: CT Chest W Contrast  Result Date: 12/17/2020 CLINICAL DATA:  60 year old male with history of non-small cell lung cancer. Follow-up study. EXAM: CT CHEST, ABDOMEN, AND PELVIS WITH CONTRAST TECHNIQUE: Multidetector CT imaging of the chest, abdomen and pelvis was performed following the standard protocol during bolus administration of intravenous contrast. CONTRAST:  66m OMNIPAQUE IOHEXOL 350 MG/ML SOLN COMPARISON:  Chest CTA 09/04/2020. PET-CT 10/02/2020. CT of the abdomen and pelvis 09/02/2020. FINDINGS: CT CHEST FINDINGS Cardiovascular: Heart size is normal. There is no significant pericardial fluid, thickening or pericardial calcification. No atherosclerotic calcifications in the thoracic aorta or coronary arteries. Mediastinum/Nodes: Prominent borderline enlarged prevascular lymph node measuring 1.2 cm in short  axis (axial image 23 of series 504), increased compared to the prior examination. Borderline enlarged left hilar lymph node measuring up to 1.2 cm in short axis (axial image 26 of series 504) also slightly increased compared to the prior examination. Esophagus is unremarkable in appearance. No axillary lymphadenopathy. Lungs/Pleura: Large macrolobulated mass in the left upper lobe again noted, currently measuring 6.0 x 3.7 x 4.6 cm (axial image 55 of series 506 and coronal image 96 of series 507), as compared with 4.1 x 3.4 cm on prior chest CT and 3.7 x 3.7 cm on prior PET-CT. Satellite micro nodularity is noted throughout the left upper lobe adjacent to the lesion, new compared to the prior examination, indicative of progression of disease. Some additional small nodules are also noted elsewhere in the lungs bilaterally, indicative of hematogenous metastases, with the largest lesion in the contralateral lung on axial image 102 of series 506 in the right lower lobe measuring 7 mm. No acute consolidative airspace disease. No pleural effusions. Mild diffuse bronchial wall thickening with mild centrilobular and paraseptal emphysema. Musculoskeletal: There are no aggressive appearing lytic or blastic lesions noted in the visualized portions of the skeleton. CT ABDOMEN PELVIS FINDINGS Hepatobiliary: No suspicious cystic or solid hepatic lesions. No intra or extrahepatic biliary ductal dilatation. There is some heterogeneous attenuation within the gallbladder lumen, likely to represent a combination of biliary sludge and/or partially calcified gallstones. Gallbladder is only moderately distended. No pericholecystic fluid or surrounding inflammatory changes. Pancreas: No pancreatic mass. No pancreatic ductal dilatation. No pancreatic or peripancreatic fluid collections or inflammatory changes. Spleen: Unremarkable. Adrenals/Urinary Tract: 2 mm nonobstructive calculus in the interpolar collecting system of the left kidney.  Right kidney and right adrenal gland are normal in appearance. Mild nodular contour of the left adrenal gland, stable compared to the prior examination and not hypermetabolic on prior PET-CT, presumably benign. No hydroureteronephrosis. Urinary bladder is normal in appearance. Stomach/Bowel: The appearance of the stomach is normal. There is no pathologic dilatation of small bowel or colon. A few scattered colonic diverticulae are noted. In the left side of the colon near the junction of distal descending colon and sigmoid colon there are some surrounding inflammatory changes as well as some regional mural thickening, potentially indicative of acute colonic diverticulitis, although these findings have been present on several prior examinations, and there appear to be some hypermetabolism in this region on prior PET-CT, such that the possibility of a primary colonic neoplasm in this region is not excluded. Normal appendix. Vascular/Lymphatic: Aortic atherosclerosis, without evidence of aneurysm or dissection in the abdominal or pelvic vasculature. No lymphadenopathy noted in the abdomen or pelvis. Reproductive: Prostate gland and seminal vesicles are unremarkable in appearance. Other: No significant volume of ascites.  No pneumoperitoneum. Musculoskeletal:  Subtle area of lucency in the medial aspect of the left ilium (axial image 97 of series 504), corresponding with area of hypermetabolism on prior PET-CT, likely a metastatic lesion. No other definite aggressive appearing lytic or blastic lesions are noted elsewhere in the visualized axial or appendicular skeleton. IMPRESSION: 1. Today's study demonstrates definitive progression of disease in terms of both enlargement of the primary left upper lobe mass, worsening metastatic lymphadenopathy in the left hilar and mediastinal nodal stations, and progressive hematogenous metastatic disease in the lungs, as detailed above. Subtle osseous lesion also noted in the left  ilium, grossly unchanged. 2. Persistent mural thickening and surrounding inflammatory changes in the colon at the junction of distal descending colon and proximal sigmoid colon. This could be related to chronic diverticular disease, however, this has been persistent on prior examinations dating back to at least May 21, 2019, and appeared associated with some degree of hypermetabolism on the prior PET-CT. Correlation with nonemergent colonoscopy is suggested to exclude the possibility of primary colonic neoplasm if clinically appropriate. 3. Cholelithiasis and/or biliary sludge in the gallbladder. No findings to suggest an acute cholecystitis at this time. 4. 2 mm nonobstructive calculus in the interpolar collecting system of the left kidney. 5. Aortic atherosclerosis. Electronically Signed   By: Vinnie Langton M.D.   On: 12/17/2020 09:44   CT Abdomen Pelvis W Contrast  Result Date: 12/17/2020 CLINICAL DATA:  60 year old male with history of non-small cell lung cancer. Follow-up study. EXAM: CT CHEST, ABDOMEN, AND PELVIS WITH CONTRAST TECHNIQUE: Multidetector CT imaging of the chest, abdomen and pelvis was performed following the standard protocol during bolus administration of intravenous contrast. CONTRAST:  26m OMNIPAQUE IOHEXOL 350 MG/ML SOLN COMPARISON:  Chest CTA 09/04/2020. PET-CT 10/02/2020. CT of the abdomen and pelvis 09/02/2020. FINDINGS: CT CHEST FINDINGS Cardiovascular: Heart size is normal. There is no significant pericardial fluid, thickening or pericardial calcification. No atherosclerotic calcifications in the thoracic aorta or coronary arteries. Mediastinum/Nodes: Prominent borderline enlarged prevascular lymph node measuring 1.2 cm in short axis (axial image 23 of series 504), increased compared to the prior examination. Borderline enlarged left hilar lymph node measuring up to 1.2 cm in short axis (axial image 26 of series 504) also slightly increased compared to the prior examination.  Esophagus is unremarkable in appearance. No axillary lymphadenopathy. Lungs/Pleura: Large macrolobulated mass in the left upper lobe again noted, currently measuring 6.0 x 3.7 x 4.6 cm (axial image 55 of series 506 and coronal image 96 of series 507), as compared with 4.1 x 3.4 cm on prior chest CT and 3.7 x 3.7 cm on prior PET-CT. Satellite micro nodularity is noted throughout the left upper lobe adjacent to the lesion, new compared to the prior examination, indicative of progression of disease. Some additional small nodules are also noted elsewhere in the lungs bilaterally, indicative of hematogenous metastases, with the largest lesion in the contralateral lung on axial image 102 of series 506 in the right lower lobe measuring 7 mm. No acute consolidative airspace disease. No pleural effusions. Mild diffuse bronchial wall thickening with mild centrilobular and paraseptal emphysema. Musculoskeletal: There are no aggressive appearing lytic or blastic lesions noted in the visualized portions of the skeleton. CT ABDOMEN PELVIS FINDINGS Hepatobiliary: No suspicious cystic or solid hepatic lesions. No intra or extrahepatic biliary ductal dilatation. There is some heterogeneous attenuation within the gallbladder lumen, likely to represent a combination of biliary sludge and/or partially calcified gallstones. Gallbladder is only moderately distended. No pericholecystic fluid or surrounding inflammatory changes.  Pancreas: No pancreatic mass. No pancreatic ductal dilatation. No pancreatic or peripancreatic fluid collections or inflammatory changes. Spleen: Unremarkable. Adrenals/Urinary Tract: 2 mm nonobstructive calculus in the interpolar collecting system of the left kidney. Right kidney and right adrenal gland are normal in appearance. Mild nodular contour of the left adrenal gland, stable compared to the prior examination and not hypermetabolic on prior PET-CT, presumably benign. No hydroureteronephrosis. Urinary  bladder is normal in appearance. Stomach/Bowel: The appearance of the stomach is normal. There is no pathologic dilatation of small bowel or colon. A few scattered colonic diverticulae are noted. In the left side of the colon near the junction of distal descending colon and sigmoid colon there are some surrounding inflammatory changes as well as some regional mural thickening, potentially indicative of acute colonic diverticulitis, although these findings have been present on several prior examinations, and there appear to be some hypermetabolism in this region on prior PET-CT, such that the possibility of a primary colonic neoplasm in this region is not excluded. Normal appendix. Vascular/Lymphatic: Aortic atherosclerosis, without evidence of aneurysm or dissection in the abdominal or pelvic vasculature. No lymphadenopathy noted in the abdomen or pelvis. Reproductive: Prostate gland and seminal vesicles are unremarkable in appearance. Other: No significant volume of ascites.  No pneumoperitoneum. Musculoskeletal: Subtle area of lucency in the medial aspect of the left ilium (axial image 97 of series 504), corresponding with area of hypermetabolism on prior PET-CT, likely a metastatic lesion. No other definite aggressive appearing lytic or blastic lesions are noted elsewhere in the visualized axial or appendicular skeleton. IMPRESSION: 1. Today's study demonstrates definitive progression of disease in terms of both enlargement of the primary left upper lobe mass, worsening metastatic lymphadenopathy in the left hilar and mediastinal nodal stations, and progressive hematogenous metastatic disease in the lungs, as detailed above. Subtle osseous lesion also noted in the left ilium, grossly unchanged. 2. Persistent mural thickening and surrounding inflammatory changes in the colon at the junction of distal descending colon and proximal sigmoid colon. This could be related to chronic diverticular disease, however, this  has been persistent on prior examinations dating back to at least May 21, 2019, and appeared associated with some degree of hypermetabolism on the prior PET-CT. Correlation with nonemergent colonoscopy is suggested to exclude the possibility of primary colonic neoplasm if clinically appropriate. 3. Cholelithiasis and/or biliary sludge in the gallbladder. No findings to suggest an acute cholecystitis at this time. 4. 2 mm nonobstructive calculus in the interpolar collecting system of the left kidney. 5. Aortic atherosclerosis. Electronically Signed   By: Vinnie Langton M.D.   On: 12/17/2020 09:44    ASSESSMENT AND PLAN: This is a very pleasant 60 year old African-American male with stage IV (T2b, N2, M1 C) non-small cell lung cancer, adenocarcinoma.  He presented with a left upper lobe lung mass in addition to left hilar and mediastinal lymphadenopathy.  He had also had a small left iliac osseous lesion and metastatic disease to the brain.  He was diagnosed in September 2022.  PD-L1 expression 10%.  [facility] does not have enough circulating tumor and his tissue was negative for any actionable mutations.  The patient is currently undergoing palliative systemic chemotherapy with carboplatin for an AUC of 5, Alimta 500 mg per metered squared, Keytruda 200 mg IV every 3 weeks.  Status post 4 cycles.  Starting from cycle #5 he will be treated with maintenance Alimta and Keytruda every 3 weeks. The patient had repeat CT scan of the chest, abdomen pelvis performed  recently.  I personally and independently reviewed the scan images and discussed the with radiology.  The scan showed some evidence for disease progression but not as significant as reported. I showed the images to the patient today and recommended for him to continue his current treatment with the same regimen and he will start cycle #5 of his treatment in 2 weeks. He will come back for follow-up visit at that time. For the hypertension, he will  establish care with a primary care physician for management of his hypertension. The patient was advised to call immediately if he has any other concerning symptoms in the interval.  The patient voices understanding of current disease status and treatment options and is in agreement with the current care plan.  All questions were answered. The patient knows to call the clinic with any problems, questions or concerns. We can certainly see the patient much sooner if necessary. The total time spent in the appointment was 35 minutes.  Disclaimer: This note was dictated with voice recognition software. Similar sounding words can inadvertently be transcribed and may not be corrected upon review.

## 2020-12-23 ENCOUNTER — Other Ambulatory Visit: Payer: Self-pay | Admitting: Radiation Therapy

## 2020-12-23 DIAGNOSIS — C7931 Secondary malignant neoplasm of brain: Secondary | ICD-10-CM

## 2020-12-24 ENCOUNTER — Ambulatory Visit: Payer: Self-pay | Admitting: Family

## 2020-12-29 ENCOUNTER — Other Ambulatory Visit: Payer: Self-pay

## 2020-12-29 ENCOUNTER — Inpatient Hospital Stay: Payer: Self-pay

## 2020-12-29 ENCOUNTER — Ambulatory Visit: Payer: Self-pay | Admitting: Family

## 2020-12-29 ENCOUNTER — Encounter: Payer: Self-pay | Admitting: Internal Medicine

## 2020-12-29 DIAGNOSIS — C3492 Malignant neoplasm of unspecified part of left bronchus or lung: Secondary | ICD-10-CM

## 2020-12-29 LAB — CMP (CANCER CENTER ONLY)
ALT: 17 U/L (ref 0–44)
AST: 19 U/L (ref 15–41)
Albumin: 3.7 g/dL (ref 3.5–5.0)
Alkaline Phosphatase: 82 U/L (ref 38–126)
Anion gap: 8 (ref 5–15)
BUN: 12 mg/dL (ref 6–20)
CO2: 26 mmol/L (ref 22–32)
Calcium: 9.1 mg/dL (ref 8.9–10.3)
Chloride: 107 mmol/L (ref 98–111)
Creatinine: 0.94 mg/dL (ref 0.61–1.24)
GFR, Estimated: >60 mL/min (ref >60)
Glucose, Bld: 141 mg/dL — ABNORMAL HIGH (ref 70–99)
Potassium: 3.4 mmol/L — ABNORMAL LOW (ref 3.5–5.1)
Sodium: 141 mmol/L (ref 135–145)
Total Bilirubin: 0.4 mg/dL (ref 0.3–1.2)
Total Protein: 7 g/dL (ref 6.5–8.1)

## 2020-12-29 LAB — CBC WITH DIFFERENTIAL (CANCER CENTER ONLY)
Abs Immature Granulocytes: 0.01 10*3/uL (ref 0.00–0.07)
Basophils Absolute: 0 10*3/uL (ref 0.0–0.1)
Basophils Relative: 1 %
Eosinophils Absolute: 0 10*3/uL (ref 0.0–0.5)
Eosinophils Relative: 1 %
HCT: 33.5 % — ABNORMAL LOW (ref 39.0–52.0)
Hemoglobin: 11 g/dL — ABNORMAL LOW (ref 13.0–17.0)
Immature Granulocytes: 0 %
Lymphocytes Relative: 44 %
Lymphs Abs: 1.7 10*3/uL (ref 0.7–4.0)
MCH: 30.1 pg (ref 26.0–34.0)
MCHC: 32.8 g/dL (ref 30.0–36.0)
MCV: 91.5 fL (ref 80.0–100.0)
Monocytes Absolute: 0.7 10*3/uL (ref 0.1–1.0)
Monocytes Relative: 20 %
Neutro Abs: 1.3 10*3/uL — ABNORMAL LOW (ref 1.7–7.7)
Neutrophils Relative %: 34 %
Platelet Count: 102 10*3/uL — ABNORMAL LOW (ref 150–400)
RBC: 3.66 MIL/uL — ABNORMAL LOW (ref 4.22–5.81)
RDW: 15.9 % — ABNORMAL HIGH (ref 11.5–15.5)
WBC Count: 3.7 10*3/uL — ABNORMAL LOW (ref 4.0–10.5)
nRBC: 0 % (ref 0.0–0.2)

## 2021-01-05 ENCOUNTER — Other Ambulatory Visit: Payer: Self-pay

## 2021-01-05 DIAGNOSIS — C3492 Malignant neoplasm of unspecified part of left bronchus or lung: Secondary | ICD-10-CM

## 2021-01-06 ENCOUNTER — Inpatient Hospital Stay: Payer: 59

## 2021-01-06 ENCOUNTER — Inpatient Hospital Stay: Payer: 59 | Attending: Internal Medicine

## 2021-01-06 ENCOUNTER — Other Ambulatory Visit: Payer: Self-pay

## 2021-01-06 ENCOUNTER — Inpatient Hospital Stay (HOSPITAL_BASED_OUTPATIENT_CLINIC_OR_DEPARTMENT_OTHER): Payer: 59 | Admitting: Internal Medicine

## 2021-01-06 ENCOUNTER — Encounter: Payer: Self-pay | Admitting: Internal Medicine

## 2021-01-06 VITALS — BP 169/95 | HR 80 | Temp 97.5°F | Resp 20 | Ht 70.0 in | Wt 287.2 lb

## 2021-01-06 DIAGNOSIS — Z79899 Other long term (current) drug therapy: Secondary | ICD-10-CM | POA: Insufficient documentation

## 2021-01-06 DIAGNOSIS — C3412 Malignant neoplasm of upper lobe, left bronchus or lung: Secondary | ICD-10-CM

## 2021-01-06 DIAGNOSIS — Z5111 Encounter for antineoplastic chemotherapy: Secondary | ICD-10-CM

## 2021-01-06 DIAGNOSIS — Z5112 Encounter for antineoplastic immunotherapy: Secondary | ICD-10-CM | POA: Insufficient documentation

## 2021-01-06 DIAGNOSIS — C7931 Secondary malignant neoplasm of brain: Secondary | ICD-10-CM | POA: Diagnosis not present

## 2021-01-06 DIAGNOSIS — C3492 Malignant neoplasm of unspecified part of left bronchus or lung: Secondary | ICD-10-CM

## 2021-01-06 DIAGNOSIS — C3490 Malignant neoplasm of unspecified part of unspecified bronchus or lung: Secondary | ICD-10-CM

## 2021-01-06 LAB — CBC WITH DIFFERENTIAL (CANCER CENTER ONLY)
Abs Immature Granulocytes: 0.03 10*3/uL (ref 0.00–0.07)
Basophils Absolute: 0 10*3/uL (ref 0.0–0.1)
Basophils Relative: 1 %
Eosinophils Absolute: 0.1 10*3/uL (ref 0.0–0.5)
Eosinophils Relative: 1 %
HCT: 35.6 % — ABNORMAL LOW (ref 39.0–52.0)
Hemoglobin: 11.9 g/dL — ABNORMAL LOW (ref 13.0–17.0)
Immature Granulocytes: 1 %
Lymphocytes Relative: 23 %
Lymphs Abs: 1.5 10*3/uL (ref 0.7–4.0)
MCH: 30.7 pg (ref 26.0–34.0)
MCHC: 33.4 g/dL (ref 30.0–36.0)
MCV: 91.8 fL (ref 80.0–100.0)
Monocytes Absolute: 0.9 10*3/uL (ref 0.1–1.0)
Monocytes Relative: 14 %
Neutro Abs: 3.9 10*3/uL (ref 1.7–7.7)
Neutrophils Relative %: 60 %
Platelet Count: 201 10*3/uL (ref 150–400)
RBC: 3.88 MIL/uL — ABNORMAL LOW (ref 4.22–5.81)
RDW: 16.4 % — ABNORMAL HIGH (ref 11.5–15.5)
WBC Count: 6.4 10*3/uL (ref 4.0–10.5)
nRBC: 0 % (ref 0.0–0.2)

## 2021-01-06 LAB — CMP (CANCER CENTER ONLY)
ALT: 19 U/L (ref 0–44)
AST: 19 U/L (ref 15–41)
Albumin: 3.7 g/dL (ref 3.5–5.0)
Alkaline Phosphatase: 88 U/L (ref 38–126)
Anion gap: 8 (ref 5–15)
BUN: 14 mg/dL (ref 6–20)
CO2: 24 mmol/L (ref 22–32)
Calcium: 8.9 mg/dL (ref 8.9–10.3)
Chloride: 108 mmol/L (ref 98–111)
Creatinine: 0.9 mg/dL (ref 0.61–1.24)
GFR, Estimated: >60 mL/min (ref >60)
Glucose, Bld: 103 mg/dL — ABNORMAL HIGH (ref 70–99)
Potassium: 3.8 mmol/L (ref 3.5–5.1)
Sodium: 140 mmol/L (ref 135–145)
Total Bilirubin: 0.4 mg/dL (ref 0.3–1.2)
Total Protein: 7.3 g/dL (ref 6.5–8.1)

## 2021-01-06 LAB — TSH: TSH: 0.893 u[IU]/mL (ref 0.320–4.118)

## 2021-01-06 MED ORDER — SODIUM CHLORIDE 0.9 % IV SOLN
500.0000 mg/m2 | Freq: Once | INTRAVENOUS | Status: AC
  Administered 2021-01-06: 1300 mg via INTRAVENOUS
  Filled 2021-01-06: qty 40

## 2021-01-06 MED ORDER — PROCHLORPERAZINE MALEATE 10 MG PO TABS
10.0000 mg | ORAL_TABLET | Freq: Once | ORAL | Status: AC
  Administered 2021-01-06: 10 mg via ORAL
  Filled 2021-01-06: qty 1

## 2021-01-06 MED ORDER — LORATADINE 10 MG PO TABS
10.0000 mg | ORAL_TABLET | Freq: Once | ORAL | Status: AC
  Administered 2021-01-06: 10 mg via ORAL
  Filled 2021-01-06: qty 1

## 2021-01-06 MED ORDER — SODIUM CHLORIDE 0.9 % IV SOLN
200.0000 mg | Freq: Once | INTRAVENOUS | Status: AC
  Administered 2021-01-06: 200 mg via INTRAVENOUS
  Filled 2021-01-06: qty 8

## 2021-01-06 MED ORDER — SODIUM CHLORIDE 0.9 % IV SOLN: Freq: Once | INTRAVENOUS | Status: AC

## 2021-01-06 NOTE — Progress Notes (Signed)
..  Patient Assist/Replace for the following has been terminated. Medication: Alimta (pemetrexed) Reason for Termination: Patient presented active insurance coverage of [employer] effective 01/03/2021. Last DOS: 12/14/2020.  Marland KitchenJuan Quam, CPhT IV Drug Replacement Specialist [facility] Phone: [phone]

## 2021-01-06 NOTE — Patient Instructions (Signed)
[facility]  Discharge Instructions: Thank you for choosing [facility] to provide your oncology and hematology care.   If you have a lab appointment with the [facility], please go directly to the [facility] and check in at the registration area.   Wear comfortable clothing and clothing appropriate for easy access to any Portacath or PICC line.   We strive to give you quality time with your provider. You may need to reschedule your appointment if you arrive late (15 or more minutes).  Arriving late affects you and other patients whose appointments are after yours.  Also, if you miss three or more appointments without notifying the office, you may be dismissed from the clinic at the providers discretion.      For prescription refill requests, have your pharmacy contact our office and allow 72 hours for refills to be completed.    Today you received the following chemotherapy and/or immunotherapy agents Keytruda, Alimta   To help prevent nausea and vomiting after your treatment, we encourage you to take your nausea medication as directed.  BELOW ARE SYMPTOMS THAT SHOULD BE REPORTED IMMEDIATELY: *FEVER GREATER THAN 100.4 F (38 C) OR HIGHER *CHILLS OR SWEATING *NAUSEA AND VOMITING THAT IS NOT CONTROLLED WITH YOUR NAUSEA MEDICATION *UNUSUAL SHORTNESS OF BREATH *UNUSUAL BRUISING OR BLEEDING *URINARY PROBLEMS (pain or burning when urinating, or frequent urination) *BOWEL PROBLEMS (unusual diarrhea, constipation, pain near the anus) TENDERNESS IN MOUTH AND THROAT WITH OR WITHOUT PRESENCE OF ULCERS (sore throat, sores in mouth, or a toothache) UNUSUAL RASH, SWELLING OR PAIN  UNUSUAL VAGINAL DISCHARGE OR ITCHING   Items with * indicate a potential emergency and should be followed up as soon as possible or go to the Emergency Department if any problems should occur.  Please show the CHEMOTHERAPY ALERT CARD or IMMUNOTHERAPY ALERT CARD at check-in  to the Emergency Department and triage nurse.  Should you have questions after your visit or need to cancel or reschedule your appointment, please contact [facility]  Dept: [phone]  and follow the prompts.  Office hours are 8:00 a.m. to 4:30 p.m. Monday - Friday. Please note that voicemails left after 4:00 p.m. may not be returned until the following business day.  We are closed weekends and major holidays. You have access to a nurse at all times for urgent questions. Please call the main number to the clinic Dept: [phone] and follow the prompts.   For any non-urgent questions, you may also contact your provider using MyChart. We now offer e-Visits for anyone 29 and older to request care online for non-urgent symptoms. For details visit [email or web address].   Also download the MyChart app! Go to the app store, search "MyChart", open the app, select [facility], and log in with your MyChart username and password.  Due to Covid, a mask is required upon entering the hospital/clinic. If you do not have a mask, one will be given to you upon arrival. For doctor visits, patients may have 1 support person aged 39 or older with them. For treatment visits, patients cannot have anyone with them due to current Covid guidelines and our immunocompromised population.

## 2021-01-06 NOTE — Progress Notes (Signed)
Raymond [name]   [phone]  OFFICE PROGRESS NOTE  Pcp, No No address on file  DIAGNOSIS: Stage IV (T2b, N2, M1c) non-small cell lung cancer, adenocarcinoma presented with left upper lobe lung mass in addition to left hilar and mediastinal lymphadenopathy diagnosed in September 2022. He also has metastatic disease to the brain.    Molecular Studies: No actionable mutations on tissue tested by [facility]  PD-L1 expression 10%.   PRIOR THERAPY: 1) SRS to the metastatic brain lesions under the care of Dr. Lisbeth Renshaw Scheduled for 10/21/20   CURRENT THERAPY: 1) Carboplatin for an AUC of 5, Alimta 500 mg/m2, Keytruda 200 mg IV every 3 weeks. First dose on 10/12/20. Status post 4 cycles.  Starting from cycle #5 the patient will be on maintenance treatment with Alimta and Keytruda every 3 weeks.  INTERVAL HISTORY: Raymond [name] 61 y.o. male returns to the clinic today for follow-up visit.  The patient is feeling fine today with no concerning complaints except for mild fatigue.  He also has occasional nausea.  He denied having any chest pain but has shortness of breath with exertion with mild cough and no hemoptysis.  He has no headache or visual changes.  He denied having any recent weight loss or night sweats.  He tolerated the last cycle of his treatment fairly well.  He is here today for evaluation before starting cycle #5 of his treatment.   MEDICAL HISTORY: Past Medical History:  Diagnosis Date   Arthritis    Baker's cyst    right   Diverticulitis    GERD (gastroesophageal reflux disease)    Heart attack ([place]) 01/04/2004   History of hiatal hernia    Hypertension    Malignant neoplasm of upper lobe of left lung ([place]) 09/24/2020   Pneumonia    as a child   Sleep apnea    no longer on Cpap    ALLERGIES:  is allergic to benadryl [diphenhydramine].  MEDICATIONS:  Current Outpatient Medications  Medication Sig Dispense Refill    acetaminophen (TYLENOL) 650 MG CR tablet Take 650 mg by mouth every 8 (eight) hours as needed for pain. (Patient not taking: Reported on 12/21/2020)     etodolac (LODINE) 500 MG tablet Take 500 mg by mouth 2 (two) times daily.     fluconazole (DIFLUCAN) 100 MG tablet Take 1 tablet  by mouth daily. 10 tablet 0   folic acid (FOLVITE) 1 MG tablet Take 1 tablet (1 mg total) by mouth daily. 30 tablet 2   gabapentin (NEURONTIN) 600 MG tablet Take 600 mg by mouth at bedtime. (Patient not taking: Reported on 12/21/2020)     losartan-hydrochlorothiazide (HYZAAR) 50-12.5 MG tablet Take 1 tablet by mouth daily. (Patient not taking: Reported on 12/21/2020)     mirtazapine (REMERON) 15 MG tablet Take 1 tablet by mouth at bedtime. 30 tablet 2   Misc Natural Products (OSTEO BI-FLEX ADV JOINT SHIELD) TABS Take 1 tablet by mouth daily.     Multiple Vitamins-Minerals (MENS MULTIVITAMIN PO) Take 1 tablet by mouth daily.     Probiotic Product (PROBIOTIC DAILY PO) Take 1 capsule by mouth daily.     prochlorperazine (COMPAZINE) 10 MG tablet Take 1 tablet (10 mg total) by mouth every 6 (six) hours as needed for nausea or vomiting. (Patient not taking: Reported on 12/21/2020) 30 tablet 0   simethicone (MYLICON) 094 MG chewable tablet Chew 125 mg by mouth every 6 (six) hours  as needed for flatulence.     Wheat Dextrin (BENEFIBER DRINK MIX) PACK Take by mouth.     No current facility-administered medications for this visit.    SURGICAL HISTORY:  Past Surgical History:  Procedure Laterality Date   BRONCHIAL BIOPSY  09/14/2020   Procedure: BRONCHIAL BIOPSIES;  Surgeon: Collene Gobble, MD;  Location: [facility];  Service: Cardiopulmonary;;   BRONCHIAL BRUSHINGS  09/14/2020   Procedure: BRONCHIAL BRUSHINGS;  Surgeon: Collene Gobble, MD;  Location: [place];  Service: Cardiopulmonary;;   BRONCHIAL NEEDLE ASPIRATION BIOPSY  09/14/2020   Procedure: BRONCHIAL NEEDLE ASPIRATION BIOPSIES;  Surgeon: Collene Gobble, MD;   Location: [place];  Service: Cardiopulmonary;;   CARDIAC CATHETERIZATION     CARPAL TUNNEL RELEASE     right   FIDUCIAL MARKER PLACEMENT  09/14/2020   Procedure: FIDUCIAL MARKER PLACEMENT;  Surgeon: Collene Gobble, MD;  Location: [place];  Service: Cardiopulmonary;;   HAND SURGERY     left   KNEE SURGERY     left   VIDEO BRONCHOSCOPY N/A 09/14/2020   Procedure: ROBOTIC VIDEO BRONCHOSCOPY WITH FLUORO;  Surgeon: Collene Gobble, MD;  Location: [place];  Service: Cardiopulmonary;  Laterality: N/A;   VIDEO BRONCHOSCOPY WITH ENDOBRONCHIAL ULTRASOUND N/A 09/14/2020   Procedure: VIDEO BRONCHOSCOPY WITH ENDOBRONCHIAL ULTRASOUND;  Surgeon: Collene Gobble, MD;  Location: [place];  Service: Cardiopulmonary;  Laterality: N/A;   VIDEO BRONCHOSCOPY WITH RADIAL ENDOBRONCHIAL ULTRASOUND  09/14/2020   Procedure: VIDEO BRONCHOSCOPY WITH RADIAL ENDOBRONCHIAL ULTRASOUND;  Surgeon: Collene Gobble, MD;  Location: [facility];  Service: Cardiopulmonary;;    REVIEW OF SYSTEMS:  A comprehensive review of systems was negative except for: Constitutional: positive for fatigue Respiratory: positive for dyspnea on exertion   PHYSICAL EXAMINATION: General appearance: alert, cooperative, fatigued, and no distress Head: Normocephalic, without obvious abnormality, atraumatic Neck: no adenopathy, no JVD, supple, symmetrical, trachea midline, and thyroid not enlarged, symmetric, no tenderness/mass/nodules Lymph nodes: Cervical, supraclavicular, and axillary nodes normal. Resp: clear to auscultation bilaterally Back: symmetric, no curvature. ROM normal. No CVA tenderness. Cardio: regular rate and rhythm, S1, S2 normal, no murmur, click, rub or gallop GI: soft, non-tender; bowel sounds normal; no masses,  no organomegaly Extremities: extremities normal, atraumatic, no cyanosis or edema  ECOG PERFORMANCE STATUS: 1 - Symptomatic but completely ambulatory  Blood pressure (!) 169/95, pulse 80, temperature  (!) 97.5 F (36.4 C), temperature source Tympanic, resp. rate 20, height _0  (1.778 m), weight 287 lb 3.2 oz (130.3 kg), SpO2 98 %.  LABORATORY DATA: Lab Results  Component Value Date   WBC 6.4 01/06/2021   HGB 11.9 (L) 01/06/2021   HCT 35.6 (L) 01/06/2021   MCV 91.8 01/06/2021   PLT 201 01/06/2021      Chemistry      Component Value Date/Time   NA 141 12/29/2020 1054   K 3.4 (L) 12/29/2020 1054   CL 107 12/29/2020 1054   CO2 26 12/29/2020 1054   BUN 12 12/29/2020 1054   CREATININE 0.94 12/29/2020 1054      Component Value Date/Time   CALCIUM 9.1 12/29/2020 1054   ALKPHOS 82 12/29/2020 1054   AST 19 12/29/2020 1054   ALT 17 12/29/2020 1054   BILITOT 0.4 12/29/2020 1054       RADIOGRAPHIC STUDIES: CT Chest W Contrast  Result Date: 12/17/2020 CLINICAL DATA:  61 year old male with history of non-small cell lung cancer. Follow-up study. EXAM: CT CHEST, ABDOMEN, AND PELVIS WITH CONTRAST TECHNIQUE: Multidetector CT imaging  of the chest, abdomen and pelvis was performed following the standard protocol during bolus administration of intravenous contrast. CONTRAST:  34m OMNIPAQUE IOHEXOL 350 MG/ML SOLN COMPARISON:  Chest CTA 09/04/2020. PET-CT 10/02/2020. CT of the abdomen and pelvis 09/02/2020. FINDINGS: CT CHEST FINDINGS Cardiovascular: Heart size is normal. There is no significant pericardial fluid, thickening or pericardial calcification. No atherosclerotic calcifications in the thoracic aorta or coronary arteries. Mediastinum/Nodes: Prominent borderline enlarged prevascular lymph node measuring 1.2 cm in short axis (axial image 23 of series 504), increased compared to the prior examination. Borderline enlarged left hilar lymph node measuring up to 1.2 cm in short axis (axial image 26 of series 504) also slightly increased compared to the prior examination. Esophagus is unremarkable in appearance. No axillary lymphadenopathy. Lungs/Pleura: Large macrolobulated mass in the left  upper lobe again noted, currently measuring 6.0 x 3.7 x 4.6 cm (axial image 55 of series 506 and coronal image 96 of series 507), as compared with 4.1 x 3.4 cm on prior chest CT and 3.7 x 3.7 cm on prior PET-CT. Satellite micro nodularity is noted throughout the left upper lobe adjacent to the lesion, new compared to the prior examination, indicative of progression of disease. Some additional small nodules are also noted elsewhere in the lungs bilaterally, indicative of hematogenous metastases, with the largest lesion in the contralateral lung on axial image 102 of series 506 in the right lower lobe measuring 7 mm. No acute consolidative airspace disease. No pleural effusions. Mild diffuse bronchial wall thickening with mild centrilobular and paraseptal emphysema. Musculoskeletal: There are no aggressive appearing lytic or blastic lesions noted in the visualized portions of the skeleton. CT ABDOMEN PELVIS FINDINGS Hepatobiliary: No suspicious cystic or solid hepatic lesions. No intra or extrahepatic biliary ductal dilatation. There is some heterogeneous attenuation within the gallbladder lumen, likely to represent a combination of biliary sludge and/or partially calcified gallstones. Gallbladder is only moderately distended. No pericholecystic fluid or surrounding inflammatory changes. Pancreas: No pancreatic mass. No pancreatic ductal dilatation. No pancreatic or peripancreatic fluid collections or inflammatory changes. Spleen: Unremarkable. Adrenals/Urinary Tract: 2 mm nonobstructive calculus in the interpolar collecting system of the left kidney. Right kidney and right adrenal gland are normal in appearance. Mild nodular contour of the left adrenal gland, stable compared to the prior examination and not hypermetabolic on prior PET-CT, presumably benign. No hydroureteronephrosis. Urinary bladder is normal in appearance. Stomach/Bowel: The appearance of the stomach is normal. There is no pathologic dilatation of  small bowel or colon. A few scattered colonic diverticulae are noted. In the left side of the colon near the junction of distal descending colon and sigmoid colon there are some surrounding inflammatory changes as well as some regional mural thickening, potentially indicative of acute colonic diverticulitis, although these findings have been present on several prior examinations, and there appear to be some hypermetabolism in this region on prior PET-CT, such that the possibility of a primary colonic neoplasm in this region is not excluded. Normal appendix. Vascular/Lymphatic: Aortic atherosclerosis, without evidence of aneurysm or dissection in the abdominal or pelvic vasculature. No lymphadenopathy noted in the abdomen or pelvis. Reproductive: Prostate gland and seminal vesicles are unremarkable in appearance. Other: No significant volume of ascites.  No pneumoperitoneum. Musculoskeletal: Subtle area of lucency in the medial aspect of the left ilium (axial image 97 of series 504), corresponding with area of hypermetabolism on prior PET-CT, likely a metastatic lesion. No other definite aggressive appearing lytic or blastic lesions are noted elsewhere in the visualized axial  or appendicular skeleton. IMPRESSION: 1. Today's study demonstrates definitive progression of disease in terms of both enlargement of the primary left upper lobe mass, worsening metastatic lymphadenopathy in the left hilar and mediastinal nodal stations, and progressive hematogenous metastatic disease in the lungs, as detailed above. Subtle osseous lesion also noted in the left ilium, grossly unchanged. 2. Persistent mural thickening and surrounding inflammatory changes in the colon at the junction of distal descending colon and proximal sigmoid colon. This could be related to chronic diverticular disease, however, this has been persistent on prior examinations dating back to at least May 21, 2019, and appeared associated with some degree of  hypermetabolism on the prior PET-CT. Correlation with nonemergent colonoscopy is suggested to exclude the possibility of primary colonic neoplasm if clinically appropriate. 3. Cholelithiasis and/or biliary sludge in the gallbladder. No findings to suggest an acute cholecystitis at this time. 4. 2 mm nonobstructive calculus in the interpolar collecting system of the left kidney. 5. Aortic atherosclerosis. Electronically Signed   By: Vinnie Langton M.D.   On: 12/17/2020 09:44   CT Abdomen Pelvis W Contrast  Result Date: 12/17/2020 CLINICAL DATA:  61 year old male with history of non-small cell lung cancer. Follow-up study. EXAM: CT CHEST, ABDOMEN, AND PELVIS WITH CONTRAST TECHNIQUE: Multidetector CT imaging of the chest, abdomen and pelvis was performed following the standard protocol during bolus administration of intravenous contrast. CONTRAST:  27m OMNIPAQUE IOHEXOL 350 MG/ML SOLN COMPARISON:  Chest CTA 09/04/2020. PET-CT 10/02/2020. CT of the abdomen and pelvis 09/02/2020. FINDINGS: CT CHEST FINDINGS Cardiovascular: Heart size is normal. There is no significant pericardial fluid, thickening or pericardial calcification. No atherosclerotic calcifications in the thoracic aorta or coronary arteries. Mediastinum/Nodes: Prominent borderline enlarged prevascular lymph node measuring 1.2 cm in short axis (axial image 23 of series 504), increased compared to the prior examination. Borderline enlarged left hilar lymph node measuring up to 1.2 cm in short axis (axial image 26 of series 504) also slightly increased compared to the prior examination. Esophagus is unremarkable in appearance. No axillary lymphadenopathy. Lungs/Pleura: Large macrolobulated mass in the left upper lobe again noted, currently measuring 6.0 x 3.7 x 4.6 cm (axial image 55 of series 506 and coronal image 96 of series 507), as compared with 4.1 x 3.4 cm on prior chest CT and 3.7 x 3.7 cm on prior PET-CT. Satellite micro nodularity is noted  throughout the left upper lobe adjacent to the lesion, new compared to the prior examination, indicative of progression of disease. Some additional small nodules are also noted elsewhere in the lungs bilaterally, indicative of hematogenous metastases, with the largest lesion in the contralateral lung on axial image 102 of series 506 in the right lower lobe measuring 7 mm. No acute consolidative airspace disease. No pleural effusions. Mild diffuse bronchial wall thickening with mild centrilobular and paraseptal emphysema. Musculoskeletal: There are no aggressive appearing lytic or blastic lesions noted in the visualized portions of the skeleton. CT ABDOMEN PELVIS FINDINGS Hepatobiliary: No suspicious cystic or solid hepatic lesions. No intra or extrahepatic biliary ductal dilatation. There is some heterogeneous attenuation within the gallbladder lumen, likely to represent a combination of biliary sludge and/or partially calcified gallstones. Gallbladder is only moderately distended. No pericholecystic fluid or surrounding inflammatory changes. Pancreas: No pancreatic mass. No pancreatic ductal dilatation. No pancreatic or peripancreatic fluid collections or inflammatory changes. Spleen: Unremarkable. Adrenals/Urinary Tract: 2 mm nonobstructive calculus in the interpolar collecting system of the left kidney. Right kidney and right adrenal gland are normal in appearance. Mild nodular  contour of the left adrenal gland, stable compared to the prior examination and not hypermetabolic on prior PET-CT, presumably benign. No hydroureteronephrosis. Urinary bladder is normal in appearance. Stomach/Bowel: The appearance of the stomach is normal. There is no pathologic dilatation of small bowel or colon. A few scattered colonic diverticulae are noted. In the left side of the colon near the junction of distal descending colon and sigmoid colon there are some surrounding inflammatory changes as well as some regional mural  thickening, potentially indicative of acute colonic diverticulitis, although these findings have been present on several prior examinations, and there appear to be some hypermetabolism in this region on prior PET-CT, such that the possibility of a primary colonic neoplasm in this region is not excluded. Normal appendix. Vascular/Lymphatic: Aortic atherosclerosis, without evidence of aneurysm or dissection in the abdominal or pelvic vasculature. No lymphadenopathy noted in the abdomen or pelvis. Reproductive: Prostate gland and seminal vesicles are unremarkable in appearance. Other: No significant volume of ascites.  No pneumoperitoneum. Musculoskeletal: Subtle area of lucency in the medial aspect of the left ilium (axial image 97 of series 504), corresponding with area of hypermetabolism on prior PET-CT, likely a metastatic lesion. No other definite aggressive appearing lytic or blastic lesions are noted elsewhere in the visualized axial or appendicular skeleton. IMPRESSION: 1. Today's study demonstrates definitive progression of disease in terms of both enlargement of the primary left upper lobe mass, worsening metastatic lymphadenopathy in the left hilar and mediastinal nodal stations, and progressive hematogenous metastatic disease in the lungs, as detailed above. Subtle osseous lesion also noted in the left ilium, grossly unchanged. 2. Persistent mural thickening and surrounding inflammatory changes in the colon at the junction of distal descending colon and proximal sigmoid colon. This could be related to chronic diverticular disease, however, this has been persistent on prior examinations dating back to at least May 21, 2019, and appeared associated with some degree of hypermetabolism on the prior PET-CT. Correlation with nonemergent colonoscopy is suggested to exclude the possibility of primary colonic neoplasm if clinically appropriate. 3. Cholelithiasis and/or biliary sludge in the gallbladder. No findings  to suggest an acute cholecystitis at this time. 4. 2 mm nonobstructive calculus in the interpolar collecting system of the left kidney. 5. Aortic atherosclerosis. Electronically Signed   By: Vinnie Langton M.D.   On: 12/17/2020 09:44    ASSESSMENT AND PLAN: This is a very pleasant 61 year old African-American male with stage IV (T2b, N2, M1 C) non-small cell lung cancer, adenocarcinoma.  He presented with a left upper lobe lung mass in addition to left hilar and mediastinal lymphadenopathy.  He had also had a small left iliac osseous lesion and metastatic disease to the brain.  He was diagnosed in September 2022.  PD-L1 expression 10%.  [facility] does not have enough circulating tumor and his tissue was negative for any actionable mutations.  The patient is currently undergoing palliative systemic chemotherapy with carboplatin for an AUC of 5, Alimta 500 mg/m2, Keytruda 200 mg IV every 3 weeks.  Status post 4 cycles.  Starting from cycle #5 he will be treated with maintenance Alimta and Keytruda every 3 weeks.  He tolerated the last cycle of his treatment well except for fatigue. I recommended for the patient to proceed with cycle #5 today as planned. I will see him back for follow-up visit in 3 weeks for evaluation with repeat CT scan of the chest, abdomen pelvis for restaging of his disease and monitoring of the suspicious disease progression seen  on the last scan. For the hypertension, he is currently on Hyzaar but he did not take his blood pressure medication today.  I advised the patient to take his blood pressure medication as prescribed and to monitor it closely at home. The patient was advised to call immediately if he has any other concerning symptoms in the interval.  The patient voices understanding of current disease status and treatment options and is in agreement with the current care plan.  All questions were answered. The patient knows to call the clinic with any problems, questions  or concerns. We can certainly see the patient much sooner if necessary. The total time spent in the appointment was 20 minutes.  Disclaimer: This note was dictated with voice recognition software. Similar sounding words can inadvertently be transcribed and may not be corrected upon review.

## 2021-01-06 NOTE — Progress Notes (Signed)
..  Patient Assist/Replace for the following has been terminated. Medication: Keytruda (pembrolizumab) Reason for Termination: Patient presented active insurance coverage of [employer] effective 01/03/2021. Last DOS: 12/14/2020.  Marland KitchenJuan Quam, CPhT IV Drug Replacement Specialist [place] Phone: [phone]

## 2021-01-06 NOTE — Addendum Note (Signed)
Addended by: Ardeen Garland on: 01/06/2021 09:51 AM   Modules accepted: Orders

## 2021-01-06 NOTE — Patient Instructions (Signed)
Steps to Quit Smoking Smoking tobacco is the leading cause of preventable death. It can affect almost every organ in the body. Smoking puts you and people around you at risk for many serious, long-lasting (chronic) diseases. Quitting smoking can be hard, but it is one of the best things that you can do for your health. It is never too late to quit. How do I get ready to quit? When you decide to quit smoking, make a plan to help you succeed. Before you quit: Pick a date to quit. Set a date within the next 2 weeks to give you time to prepare. Write down the reasons why you are quitting. Keep this list in places where you will see it often. Tell your family, friends, and co-workers that you are quitting. Their support is important. Talk with your doctor about the choices that may help you quit. Find out if your health insurance will pay for these treatments. Know the people, places, things, and activities that make you want to smoke (triggers). Avoid them. What first steps can I take to quit smoking? Throw away all cigarettes at home, at work, and in your car. Throw away the things that you use when you smoke, such as ashtrays and lighters. Clean your car. Make sure to empty the ashtray. Clean your home, including curtains and carpets. What can I do to help me quit smoking? Talk with your doctor about taking medicines and seeing a counselor at the same time. You are more likely to succeed when you do both. If you are pregnant or breastfeeding, talk with your doctor about counseling or other ways to quit smoking. Do not take medicine to help you quit smoking unless your doctor tells you to do so. To quit smoking: Quit right away Quit smoking totally, instead of slowly cutting back on how much you smoke over a period of time. Go to counseling. You are more likely to quit if you go to counseling sessions regularly. Take medicine You may take medicines to help you quit. Some medicines need a  prescription, and some you can buy over-the-counter. Some medicines may contain a drug called nicotine to replace the nicotine in cigarettes. Medicines may: Help you to stop having the desire to smoke (cravings). Help to stop the problems that come when you stop smoking (withdrawal symptoms). Your doctor may ask you to use: Nicotine patches, gum, or lozenges. Nicotine inhalers or sprays. Non-nicotine medicine that is taken by mouth. Find resources Find resources and other ways to help you quit smoking and remain smoke-free after you quit. These resources are most helpful when you use them often. They include: Online chats with a counselor. Phone quitlines. Printed self-help materials. Support groups or group counseling. Text messaging programs. Mobile phone apps. Use apps on your mobile phone or tablet that can help you stick to your quit plan. There are many free apps for mobile phones and tablets as well as websites. Examples include Quit Guide from the CDC and smokefree.gov  What things can I do to make it easier to quit?  Talk to your family and friends. Ask them to support and encourage you. Call a phone quitline (1-800-QUIT-NOW), reach out to support groups, or work with a counselor. Ask people who smoke to not smoke around you. Avoid places that make you want to smoke, such as: Bars. Parties. Smoke-break areas at work. Spend time with people who do not smoke. Lower the stress in your life. Stress can make you want to   smoke. Try these things to help your stress: Getting regular exercise. Doing deep-breathing exercises. Doing yoga. Meditating. Doing a body scan. To do this, close your eyes, focus on one area of your body at a time from head to toe. Notice which parts of your body are tense. Try to relax the muscles in those areas. How will I feel when I quit smoking? Day 1 to 3 weeks Within the first 24 hours, you may start to have some problems that come from quitting tobacco.  These problems are very bad 2-3 days after you quit, but they do not often last for more than 2-3 weeks. You may get these symptoms: Mood swings. Feeling restless, nervous, angry, or annoyed. Trouble concentrating. Dizziness. Strong desire for high-sugar foods and nicotine. Weight gain. Trouble pooping (constipation). Feeling like you may vomit (nausea). Coughing or a sore throat. Changes in how the medicines that you take for other issues work in your body. Depression. Trouble sleeping (insomnia). Week 3 and afterward After the first 2-3 weeks of quitting, you may start to notice more positive results, such as: Better sense of smell and taste. Less coughing and sore throat. Slower heart rate. Lower blood pressure. Clearer skin. Better breathing. Fewer sick days. Quitting smoking can be hard. Do not give up if you fail the first time. Some people need to try a few times before they succeed. Do your best to stick to your quit plan, and talk with your doctor if you have any questions or concerns. Summary Smoking tobacco is the leading cause of preventable death. Quitting smoking can be hard, but it is one of the best things that you can do for your health. When you decide to quit smoking, make a plan to help you succeed. Quit smoking right away, not slowly over a period of time. When you start quitting, seek help from your doctor, family, or friends. This information is not intended to replace advice given to you by your health care provider. Make sure you discuss any questions you have with your health care provider. Document Revised: 08/28/2020 Document Reviewed: 03/10/2018 Elsevier Patient Education  2022 [employer]  

## 2021-01-11 ENCOUNTER — Other Ambulatory Visit: Payer: Self-pay | Admitting: Radiation Therapy

## 2021-01-11 DIAGNOSIS — C7931 Secondary malignant neoplasm of brain: Secondary | ICD-10-CM

## 2021-01-18 ENCOUNTER — Encounter: Payer: Self-pay | Admitting: Internal Medicine

## 2021-01-21 ENCOUNTER — Ambulatory Visit (HOSPITAL_COMMUNITY): Payer: MEDICAID

## 2021-01-21 ENCOUNTER — Ambulatory Visit (HOSPITAL_COMMUNITY)
Admission: RE | Admit: 2021-01-21 | Discharge: 2021-01-21 | Disposition: A | Discharge status: Home / Self Care | Payer: 59 | Source: Ambulatory Visit | Attending: Radiation Oncology | Admitting: Radiation Oncology

## 2021-01-21 ENCOUNTER — Other Ambulatory Visit: Payer: Self-pay

## 2021-01-21 DIAGNOSIS — R1011 Right upper quadrant pain: Secondary | ICD-10-CM | POA: Diagnosis not present

## 2021-01-21 DIAGNOSIS — A419 Sepsis, unspecified organism: Secondary | ICD-10-CM | POA: Diagnosis not present

## 2021-01-21 DIAGNOSIS — C7931 Secondary malignant neoplasm of brain: Secondary | ICD-10-CM | POA: Insufficient documentation

## 2021-01-21 MED ORDER — GADOBUTROL 1 MMOL/ML IV SOLN
10.0000 mL | Freq: Once | INTRAVENOUS | Status: AC | PRN
  Administered 2021-01-21: 10 mL via INTRAVENOUS

## 2021-01-22 ENCOUNTER — Encounter: Payer: Self-pay | Admitting: Radiation Oncology

## 2021-01-22 ENCOUNTER — Other Ambulatory Visit: Payer: Self-pay | Admitting: Internal Medicine

## 2021-01-22 ENCOUNTER — Encounter (HOSPITAL_COMMUNITY): Payer: Self-pay

## 2021-01-22 ENCOUNTER — Other Ambulatory Visit: Payer: Self-pay

## 2021-01-22 ENCOUNTER — Ambulatory Visit (HOSPITAL_COMMUNITY)
Admission: RE | Admit: 2021-01-22 | Discharge: 2021-01-22 | Disposition: A | Discharge status: Home / Self Care | Payer: 59 | Source: Ambulatory Visit | Attending: Internal Medicine | Admitting: Internal Medicine

## 2021-01-22 DIAGNOSIS — C3490 Malignant neoplasm of unspecified part of unspecified bronchus or lung: Secondary | ICD-10-CM | POA: Insufficient documentation

## 2021-01-22 DIAGNOSIS — C3492 Malignant neoplasm of unspecified part of left bronchus or lung: Secondary | ICD-10-CM

## 2021-01-22 MED ORDER — SODIUM CHLORIDE (PF) 0.9 % IJ SOLN
INTRAMUSCULAR | Status: AC
  Filled 2021-01-22: qty 50

## 2021-01-22 MED ORDER — IOHEXOL 300 MG/ML  SOLN
100.0000 mL | Freq: Once | INTRAMUSCULAR | Status: AC | PRN
  Administered 2021-01-22: 100 mL via INTRAVENOUS

## 2021-01-22 NOTE — Progress Notes (Signed)
Spoke w/ patient, verified identity, and begin nursing interview. Patient states "Doing well, but battling/managing mild depression." No other symptoms reported at this time.  Meaningful  use complete.  Patient notified of 8:30am-01/26/21 telephone appointment w/ Shona Simpson PA-C. I left my extension [phone] in case patient needs to call.  Patient preferred contact [phone]

## 2021-01-24 ENCOUNTER — Encounter (HOSPITAL_COMMUNITY): Payer: Self-pay

## 2021-01-24 ENCOUNTER — Emergency Department (HOSPITAL_COMMUNITY): Payer: 59

## 2021-01-24 ENCOUNTER — Inpatient Hospital Stay (HOSPITAL_COMMUNITY)
Admission: EM | Admit: 2021-01-24 | Discharge: 2021-01-27 | DRG: 872 | Disposition: A | Discharge status: Home / Self Care | Payer: 59 | Attending: Family Medicine | Admitting: Family Medicine

## 2021-01-24 DIAGNOSIS — K8020 Calculus of gallbladder without cholecystitis without obstruction: Secondary | ICD-10-CM | POA: Diagnosis present

## 2021-01-24 DIAGNOSIS — A09 Infectious gastroenteritis and colitis, unspecified: Secondary | ICD-10-CM | POA: Diagnosis present

## 2021-01-24 DIAGNOSIS — F17210 Nicotine dependence, cigarettes, uncomplicated: Secondary | ICD-10-CM | POA: Diagnosis present

## 2021-01-24 DIAGNOSIS — Z8249 Family history of ischemic heart disease and other diseases of the circulatory system: Secondary | ICD-10-CM

## 2021-01-24 DIAGNOSIS — A419 Sepsis, unspecified organism: Principal | ICD-10-CM | POA: Diagnosis present

## 2021-01-24 DIAGNOSIS — Z6841 Body Mass Index (BMI) 40.0 and over, adult: Secondary | ICD-10-CM | POA: Diagnosis not present

## 2021-01-24 DIAGNOSIS — I252 Old myocardial infarction: Secondary | ICD-10-CM | POA: Diagnosis not present

## 2021-01-24 DIAGNOSIS — R1011 Right upper quadrant pain: Secondary | ICD-10-CM | POA: Diagnosis present

## 2021-01-24 DIAGNOSIS — G8929 Other chronic pain: Secondary | ICD-10-CM | POA: Diagnosis present

## 2021-01-24 DIAGNOSIS — K648 Other hemorrhoids: Secondary | ICD-10-CM | POA: Diagnosis present

## 2021-01-24 DIAGNOSIS — Z20822 Contact with and (suspected) exposure to covid-19: Secondary | ICD-10-CM | POA: Diagnosis present

## 2021-01-24 DIAGNOSIS — C7931 Secondary malignant neoplasm of brain: Secondary | ICD-10-CM | POA: Diagnosis present

## 2021-01-24 DIAGNOSIS — Z85118 Personal history of other malignant neoplasm of bronchus and lung: Secondary | ICD-10-CM

## 2021-01-24 DIAGNOSIS — G4730 Sleep apnea, unspecified: Secondary | ICD-10-CM | POA: Diagnosis present

## 2021-01-24 DIAGNOSIS — Z800 Family history of malignant neoplasm of digestive organs: Secondary | ICD-10-CM | POA: Diagnosis not present

## 2021-01-24 DIAGNOSIS — E876 Hypokalemia: Secondary | ICD-10-CM | POA: Diagnosis present

## 2021-01-24 DIAGNOSIS — Z801 Family history of malignant neoplasm of trachea, bronchus and lung: Secondary | ICD-10-CM

## 2021-01-24 DIAGNOSIS — R630 Anorexia: Secondary | ICD-10-CM | POA: Diagnosis present

## 2021-01-24 DIAGNOSIS — E6609 Other obesity due to excess calories: Secondary | ICD-10-CM | POA: Diagnosis not present

## 2021-01-24 DIAGNOSIS — C3412 Malignant neoplasm of upper lobe, left bronchus or lung: Secondary | ICD-10-CM | POA: Diagnosis present

## 2021-01-24 DIAGNOSIS — M79604 Pain in right leg: Secondary | ICD-10-CM | POA: Diagnosis not present

## 2021-01-24 DIAGNOSIS — G2581 Restless legs syndrome: Secondary | ICD-10-CM | POA: Diagnosis present

## 2021-01-24 DIAGNOSIS — D649 Anemia, unspecified: Secondary | ICD-10-CM | POA: Diagnosis present

## 2021-01-24 DIAGNOSIS — M79605 Pain in left leg: Secondary | ICD-10-CM | POA: Diagnosis not present

## 2021-01-24 DIAGNOSIS — E669 Obesity, unspecified: Secondary | ICD-10-CM | POA: Diagnosis present

## 2021-01-24 DIAGNOSIS — Z515 Encounter for palliative care: Secondary | ICD-10-CM | POA: Diagnosis not present

## 2021-01-24 DIAGNOSIS — R399 Unspecified symptoms and signs involving the genitourinary system: Secondary | ICD-10-CM

## 2021-01-24 DIAGNOSIS — K529 Noninfective gastroenteritis and colitis, unspecified: Secondary | ICD-10-CM | POA: Diagnosis not present

## 2021-01-24 DIAGNOSIS — K219 Gastro-esophageal reflux disease without esophagitis: Secondary | ICD-10-CM | POA: Diagnosis present

## 2021-01-24 DIAGNOSIS — C3492 Malignant neoplasm of unspecified part of left bronchus or lung: Secondary | ICD-10-CM | POA: Diagnosis not present

## 2021-01-24 DIAGNOSIS — K828 Other specified diseases of gallbladder: Secondary | ICD-10-CM | POA: Diagnosis present

## 2021-01-24 DIAGNOSIS — Z79899 Other long term (current) drug therapy: Secondary | ICD-10-CM

## 2021-01-24 DIAGNOSIS — Z7189 Other specified counseling: Secondary | ICD-10-CM | POA: Diagnosis not present

## 2021-01-24 DIAGNOSIS — I10 Essential (primary) hypertension: Secondary | ICD-10-CM | POA: Diagnosis present

## 2021-01-24 DIAGNOSIS — R079 Chest pain, unspecified: Secondary | ICD-10-CM

## 2021-01-24 DIAGNOSIS — Z888 Allergy status to other drugs, medicaments and biological substances status: Secondary | ICD-10-CM

## 2021-01-24 LAB — CBC WITH DIFFERENTIAL/PLATELET
Abs Immature Granulocytes: 0.14 10*3/uL — ABNORMAL HIGH (ref 0.00–0.07)
Basophils Absolute: 0.1 10*3/uL (ref 0.0–0.1)
Basophils Relative: 0 %
Eosinophils Absolute: 0 10*3/uL (ref 0.0–0.5)
Eosinophils Relative: 0 %
HCT: 34 % — ABNORMAL LOW (ref 39.0–52.0)
Hemoglobin: 10.9 g/dL — ABNORMAL LOW (ref 13.0–17.0)
Immature Granulocytes: 1 %
Lymphocytes Relative: 7 %
Lymphs Abs: 1.5 10*3/uL (ref 0.7–4.0)
MCH: 31.1 pg (ref 26.0–34.0)
MCHC: 32.1 g/dL (ref 30.0–36.0)
MCV: 97.1 fL (ref 80.0–100.0)
Monocytes Absolute: 2.5 10*3/uL — ABNORMAL HIGH (ref 0.1–1.0)
Monocytes Relative: 12 %
Neutro Abs: 15.9 10*3/uL — ABNORMAL HIGH (ref 1.7–7.7)
Neutrophils Relative %: 80 %
Platelets: 303 10*3/uL (ref 150–400)
RBC: 3.5 MIL/uL — ABNORMAL LOW (ref 4.22–5.81)
RDW: 15.8 % — ABNORMAL HIGH (ref 11.5–15.5)
WBC: 20 10*3/uL — ABNORMAL HIGH (ref 4.0–10.5)
nRBC: 0 % (ref 0.0–0.2)

## 2021-01-24 LAB — COMPREHENSIVE METABOLIC PANEL
ALT: 16 U/L (ref 0–44)
AST: 19 U/L (ref 15–41)
Albumin: 3.4 g/dL — ABNORMAL LOW (ref 3.5–5.0)
Alkaline Phosphatase: 80 U/L (ref 38–126)
Anion gap: 9 (ref 5–15)
BUN: 13 mg/dL (ref 6–20)
CO2: 24 mmol/L (ref 22–32)
Calcium: 8.8 mg/dL — ABNORMAL LOW (ref 8.9–10.3)
Chloride: 106 mmol/L (ref 98–111)
Creatinine, Ser: 1 mg/dL (ref 0.61–1.24)
GFR, Estimated: >60 mL/min (ref >60)
Glucose, Bld: 131 mg/dL — ABNORMAL HIGH (ref 70–99)
Potassium: 3.3 mmol/L — ABNORMAL LOW (ref 3.5–5.1)
Sodium: 139 mmol/L (ref 135–145)
Total Bilirubin: 0.5 mg/dL (ref 0.3–1.2)
Total Protein: 7.6 g/dL (ref 6.5–8.1)

## 2021-01-24 LAB — LACTIC ACID, PLASMA
Lactic Acid, Venous: 1.1 mmol/L (ref 0.5–1.9)
Lactic Acid, Venous: 2.1 mmol/L (ref 0.5–1.9)
Lactic Acid, Venous: 1.8 mmol/L (ref 0.5–1.9)

## 2021-01-24 LAB — URINALYSIS, ROUTINE W REFLEX MICROSCOPIC
Bilirubin Urine: NEGATIVE
Glucose, UA: NEGATIVE mg/dL
Hgb urine dipstick: NEGATIVE
Ketones, ur: 5 mg/dL — AB
Leukocytes,Ua: NEGATIVE
Nitrite: NEGATIVE
Protein, ur: 30 mg/dL — AB
Specific Gravity, Urine: 1.025 (ref 1.005–1.030)
pH: 5 (ref 5.0–8.0)

## 2021-01-24 LAB — RESP PANEL BY RT-PCR (FLU A&B, COVID) ARPGX2
Influenza A by PCR: NEGATIVE
Influenza B by PCR: NEGATIVE
SARS Coronavirus 2 by RT PCR: NEGATIVE

## 2021-01-24 LAB — TROPONIN I (HIGH SENSITIVITY)
Troponin I (High Sensitivity): 14 ng/L (ref <18)
Troponin I (High Sensitivity): 11 ng/L (ref <18)

## 2021-01-24 LAB — PROTIME-INR
INR: 1.1 (ref 0.8–1.2)
Prothrombin Time: 13.8 seconds (ref 11.4–15.2)

## 2021-01-24 LAB — MAGNESIUM: Magnesium: 2 mg/dL (ref 1.7–2.4)

## 2021-01-24 LAB — APTT: aPTT: 28 seconds (ref 24–36)

## 2021-01-24 MED ORDER — ACETAMINOPHEN 325 MG PO TABS
650.0000 mg | ORAL_TABLET | Freq: Four times a day (QID) | ORAL | Status: DC | PRN
  Administered 2021-01-25: 14:00:00 650 mg via ORAL
  Filled 2021-01-24 (×3): qty 2

## 2021-01-24 MED ORDER — LACTATED RINGERS IV SOLN: INTRAVENOUS | Status: AC

## 2021-01-24 MED ORDER — ENOXAPARIN SODIUM 60 MG/0.6ML IJ SOSY
60.0000 mg | PREFILLED_SYRINGE | INTRAMUSCULAR | Status: DC
  Administered 2021-01-24 – 2021-01-26 (×3): 60 mg via SUBCUTANEOUS
  Filled 2021-01-24 (×3): qty 0.6

## 2021-01-24 MED ORDER — LACTATED RINGERS IV BOLUS
1000.0000 mL | Freq: Once | INTRAVENOUS | Status: AC
  Administered 2021-01-24: 1000 mL via INTRAVENOUS

## 2021-01-24 MED ORDER — ONDANSETRON HCL 4 MG/2ML IJ SOLN
4.0000 mg | Freq: Once | INTRAMUSCULAR | Status: AC
  Administered 2021-01-24: 4 mg via INTRAVENOUS
  Filled 2021-01-24: qty 2

## 2021-01-24 MED ORDER — VANCOMYCIN HCL 500 MG/100ML IV SOLN
500.0000 mg | Freq: Once | INTRAVENOUS | Status: AC
  Administered 2021-01-24: 500 mg via INTRAVENOUS
  Filled 2021-01-24: qty 100

## 2021-01-24 MED ORDER — HYDRALAZINE HCL 20 MG/ML IJ SOLN: 10.0000 mg | Freq: Three times a day (TID) | INTRAMUSCULAR | Status: DC | PRN

## 2021-01-24 MED ORDER — ACETAMINOPHEN 650 MG RE SUPP: 650.0000 mg | Freq: Four times a day (QID) | RECTAL | Status: DC | PRN

## 2021-01-24 MED ORDER — ASPIRIN 81 MG PO CHEW
324.0000 mg | CHEWABLE_TABLET | Freq: Once | ORAL | Status: AC
  Administered 2021-01-24: 324 mg via ORAL
  Filled 2021-01-24: qty 4

## 2021-01-24 MED ORDER — METRONIDAZOLE 500 MG/100ML IV SOLN
500.0000 mg | Freq: Once | INTRAVENOUS | Status: AC
  Administered 2021-01-24: 500 mg via INTRAVENOUS
  Filled 2021-01-24: qty 100

## 2021-01-24 MED ORDER — ACETAMINOPHEN 325 MG PO TABS
650.0000 mg | ORAL_TABLET | Freq: Once | ORAL | Status: AC
  Administered 2021-01-24: 650 mg via ORAL
  Filled 2021-01-24: qty 2

## 2021-01-24 MED ORDER — POTASSIUM CHLORIDE CRYS ER 20 MEQ PO TBCR
40.0000 meq | EXTENDED_RELEASE_TABLET | Freq: Two times a day (BID) | ORAL | Status: AC
  Administered 2021-01-24 – 2021-01-25 (×3): 40 meq via ORAL
  Filled 2021-01-24 (×3): qty 2

## 2021-01-24 MED ORDER — ALBUTEROL SULFATE (2.5 MG/3ML) 0.083% IN NEBU: 2.5000 mg | INHALATION_SOLUTION | RESPIRATORY_TRACT | Status: DC | PRN

## 2021-01-24 MED ORDER — FOLIC ACID 1 MG PO TABS
1.0000 mg | ORAL_TABLET | Freq: Every day | ORAL | Status: DC
  Administered 2021-01-24 – 2021-01-27 (×3): 1 mg via ORAL
  Filled 2021-01-24 (×3): qty 1

## 2021-01-24 MED ORDER — LACTATED RINGERS IV BOLUS (SEPSIS)
1000.0000 mL | Freq: Once | INTRAVENOUS | Status: AC
  Administered 2021-01-24: 1000 mL via INTRAVENOUS

## 2021-01-24 MED ORDER — IBUPROFEN 200 MG PO TABS: 600.0000 mg | ORAL_TABLET | Freq: Four times a day (QID) | ORAL | Status: DC

## 2021-01-24 MED ORDER — HYDROMORPHONE HCL 1 MG/ML IJ SOLN
1.0000 mg | Freq: Once | INTRAMUSCULAR | Status: AC
  Administered 2021-01-24: 1 mg via INTRAVENOUS
  Filled 2021-01-24: qty 1

## 2021-01-24 MED ORDER — GUAIFENESIN ER 600 MG PO TB12
600.0000 mg | ORAL_TABLET | Freq: Two times a day (BID) | ORAL | Status: DC
  Administered 2021-01-24 – 2021-01-27 (×5): 600 mg via ORAL
  Filled 2021-01-24 (×5): qty 1

## 2021-01-24 MED ORDER — VANCOMYCIN HCL IN DEXTROSE 1-5 GM/200ML-% IV SOLN
1000.0000 mg | Freq: Once | INTRAVENOUS | Status: DC
  Filled 2021-01-24: qty 200

## 2021-01-24 MED ORDER — VANCOMYCIN HCL 1250 MG/250ML IV SOLN
1250.0000 mg | Freq: Two times a day (BID) | INTRAVENOUS | Status: DC
  Administered 2021-01-25: 1250 mg via INTRAVENOUS
  Filled 2021-01-24: qty 250

## 2021-01-24 MED ORDER — IOHEXOL 350 MG/ML SOLN
100.0000 mL | Freq: Once | INTRAVENOUS | Status: AC | PRN
  Administered 2021-01-24: 100 mL via INTRAVENOUS

## 2021-01-24 MED ORDER — LACTATED RINGERS IV BOLUS (SEPSIS)
400.0000 mL | Freq: Once | INTRAVENOUS | Status: AC
  Administered 2021-01-24: 400 mL via INTRAVENOUS

## 2021-01-24 MED ORDER — MORPHINE SULFATE (PF) 4 MG/ML IV SOLN
4.0000 mg | INTRAVENOUS | Status: DC | PRN
  Administered 2021-01-24 – 2021-01-27 (×12): 4 mg via INTRAVENOUS
  Filled 2021-01-24 (×12): qty 1

## 2021-01-24 MED ORDER — SODIUM CHLORIDE 0.9 % IV SOLN
2.0000 g | Freq: Three times a day (TID) | INTRAVENOUS | Status: DC
  Administered 2021-01-24 – 2021-01-27 (×9): 2 g via INTRAVENOUS
  Filled 2021-01-24 (×9): qty 2

## 2021-01-24 MED ORDER — KETOROLAC TROMETHAMINE 30 MG/ML IJ SOLN
30.0000 mg | Freq: Four times a day (QID) | INTRAMUSCULAR | Status: AC | PRN
  Administered 2021-01-24 – 2021-01-25 (×2): 30 mg via INTRAVENOUS
  Filled 2021-01-24 (×2): qty 1

## 2021-01-24 MED ORDER — METRONIDAZOLE 500 MG/100ML IV SOLN
500.0000 mg | Freq: Two times a day (BID) | INTRAVENOUS | Status: DC
  Administered 2021-01-24 – 2021-01-27 (×6): 500 mg via INTRAVENOUS
  Filled 2021-01-24 (×6): qty 100

## 2021-01-24 MED ORDER — PROCHLORPERAZINE MALEATE 10 MG PO TABS
10.0000 mg | ORAL_TABLET | Freq: Four times a day (QID) | ORAL | Status: DC | PRN
  Administered 2021-01-25: 10 mg via ORAL
  Filled 2021-01-24: qty 1

## 2021-01-24 MED ORDER — VANCOMYCIN HCL 2000 MG/400ML IV SOLN
2000.0000 mg | Freq: Once | INTRAVENOUS | Status: AC
  Administered 2021-01-24: 2000 mg via INTRAVENOUS
  Filled 2021-01-24: qty 400

## 2021-01-24 MED ORDER — SODIUM CHLORIDE 0.9 % IV SOLN
2.0000 g | Freq: Once | INTRAVENOUS | Status: AC
  Administered 2021-01-24: 2 g via INTRAVENOUS
  Filled 2021-01-24: qty 2

## 2021-01-24 MED ORDER — MORPHINE SULFATE (PF) 4 MG/ML IV SOLN
4.0000 mg | Freq: Once | INTRAVENOUS | Status: AC
  Administered 2021-01-24: 4 mg via INTRAVENOUS
  Filled 2021-01-24: qty 1

## 2021-01-24 NOTE — ED Provider Notes (Signed)
[facility] DEPT Provider Note   CSN: [id number] Arrival date & time: 01/24/21  1102     History  Chief Complaint  Patient presents with   Abdominal Pain    Raymond [name] is a 61 y.o. 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  Presents to the emergency department with a chief complaint of left lower quadrant abdominal pain.  Patient reports that he pain started yesterday and has gotten progressively worse.  States that pain radiates to his left chest.  Patient rates pain 10/10 on the pain scale.  Pain is worse with touch, movement, and p.o. intake.  Pain is stabbing.  Patient endorses associated shortness of breath.  Patient reports that he took Tylenol, laxative, stool softener, and fiber with no improvement in his symptoms.  Patient endorses productive cough.  States that cough is producing clear to light green mucus.  Patient states that he had swelling to left lower extremity last week however that has resolved.  Patient endorses urinary frequency.  Patient denies any fever, chills, congestion, rhinorrhea, sore throat, hemoptysis, palpitations, abdominal distention, constipation, diarrhea, blood in stool, melena, dysuria, hematuria, swelling or tenderness to genitals, genital sores or lesions.     Abdominal Pain Associated symptoms: chest pain, cough and shortness of breath   Associated symptoms: no chills, no constipation, no diarrhea, no dysuria, no fever, no hematuria, no nausea, no sore throat and no vomiting       Home Medications Prior to Admission medications   Medication Sig Start Date End Date Taking? Authorizing Provider  acetaminophen (TYLENOL) 650 MG CR tablet Take 650 mg by mouth every 8 (eight) hours as needed for pain. Patient not taking: Reported on 12/21/2020     [provider]  etodolac (LODINE) 500 MG tablet Take 500 mg by mouth 2 (two) times daily. 07/08/20   [provider]  folic acid (FOLVITE) 1 MG tablet Take 1 tablet (1 mg total) by mouth daily. 11/23/20   Curt Bears, MD  gabapentin (NEURONTIN) 600 MG tablet Take 600 mg by mouth at bedtime. Patient not taking: Reported on 12/21/2020 07/08/20   [provider]  losartan-hydrochlorothiazide (HYZAAR) 50-12.5 MG tablet Take 1 tablet by mouth daily. Patient not taking: Reported on 12/21/2020 07/09/20   [provider]  mirtazapine (REMERON) 15 MG tablet Take 1 tablet by mouth at bedtime. 12/14/20   [name], [name], PA-C  Misc Natural Products (OSTEO BI-FLEX ADV JOINT SHIELD) TABS Take 1 tablet by mouth daily.    [provider]  Multiple Vitamins-Minerals (MENS MULTIVITAMIN PO) Take 1 tablet by mouth daily.    [provider]  prochlorperazine (COMPAZINE) 10 MG tablet Take 1 tablet (10 mg total) by mouth every 6 (six) hours as needed for nausea or vomiting. Patient not taking: Reported on 12/21/2020 09/28/20   Curt Bears, MD  simethicone (MYLICON) 427 MG chewable tablet Chew 125 mg by mouth every 6 (six) hours as needed for flatulence.    [provider]  Wheat Dextrin (BENEFIBER DRINK MIX) PACK Take by mouth.    [provider]      Allergies    Benadryl [diphenhydramine]    Review of Systems   Review of Systems  Constitutional:  Negative for chills and fever.  HENT:  Negative for congestion, rhinorrhea and sore throat.   Eyes:  Negative for visual  disturbance.  Respiratory:  Positive for cough and shortness of breath.   Cardiovascular:  Positive for chest pain and leg swelling.  Gastrointestinal:  Positive for abdominal pain. Negative for abdominal distention, anal bleeding, blood in stool, constipation, diarrhea, nausea, rectal pain and vomiting.  Genitourinary:  Positive for frequency. Negative for  difficulty urinating, dysuria, flank pain, genital sores, hematuria, penile discharge, penile pain, penile swelling, scrotal swelling, testicular pain and urgency.  Musculoskeletal:  Negative for back pain and neck pain.  Skin:  Negative for color change and rash.  Neurological:  Negative for dizziness, syncope, light-headedness and headaches.  Psychiatric/Behavioral:  Negative for confusion.    Physical Exam Updated Vital Signs BP (!) 154/83 (BP Location: Left Arm)    Pulse (!) 114    Temp 99.9 F (37.7 C) (Oral)    Resp 20    Ht _0  (1.778 m)    Wt 130.2 kg    SpO2 94%    BMI 41.18 kg/m  Physical Exam Vitals and nursing note reviewed.  Constitutional:      General: He is not in acute distress.    Appearance: He is not ill-appearing, toxic-appearing or diaphoretic.  HENT:     Head: Normocephalic.  Eyes:     General: No scleral icterus.       Right eye: No discharge.        Left eye: No discharge.  Cardiovascular:     Rate and Rhythm: Tachycardia present.     Pulses:          Radial pulses are 2+ on the right side and 2+ on the left side.  Pulmonary:     Effort: Pulmonary effort is normal. No tachypnea, bradypnea or respiratory distress.     Breath sounds: Normal breath sounds. No stridor.  Abdominal:     General: Abdomen is protuberant. Bowel sounds are normal. There is no distension. There are no signs of injury.     Palpations: Abdomen is soft. There is no mass or pulsatile mass.     Tenderness: There is abdominal tenderness in the left upper quadrant and left lower quadrant. There is no right CVA tenderness, left CVA tenderness, guarding or rebound.     Hernia: There is no hernia in the umbilical area or ventral area.  Musculoskeletal:     Right lower leg: Normal.     Left lower leg: Normal.  Skin:    General: Skin is warm and dry.  Neurological:     General: No focal deficit present.     Mental Status: He is alert.  Psychiatric:        Behavior: Behavior is  cooperative.    ED Results / Procedures / Treatments   Labs (all labs ordered are listed, but only abnormal results are displayed) Labs Reviewed  COMPREHENSIVE METABOLIC PANEL - Abnormal; Notable for the following components:      Result Value   Potassium 3.3 (*)    Glucose, Bld 131 (*)    Calcium 8.8 (*)    Albumin 3.4 (*)    All other components within normal limits  CBC WITH DIFFERENTIAL/PLATELET - Abnormal; Notable for the following components:   WBC 20.0 (*)    RBC 3.50 (*)    Hemoglobin 10.9 (*)    HCT 34.0 (*)    RDW 15.8 (*)    Neutro Abs 15.9 (*)    Monocytes Absolute 2.5 (*)    Abs Immature Granulocytes 0.14 (*)    All other components  within normal limits  CULTURE, BLOOD (ROUTINE X 2)  CULTURE, BLOOD (ROUTINE X 2)  URINE CULTURE  RESP PANEL BY RT-PCR (FLU A&B, COVID) ARPGX2  LACTIC ACID, PLASMA  PROTIME-INR  APTT  URINALYSIS, ROUTINE W REFLEX MICROSCOPIC  LACTIC ACID, PLASMA  TROPONIN I (HIGH SENSITIVITY)  TROPONIN I (HIGH SENSITIVITY)    EKG None  Radiology CT Angio Chest PE W and/or Wo Contrast  Result Date: 01/24/2021 CLINICAL DATA:  61 year old male with stage IV non-small cell lung cancer with diffuse chest and abdominal pain with cough and intermittent shortness of breath. Chemotherapy in progress. EXAM: CT ANGIOGRAPHY CHEST CT ABDOMEN AND PELVIS WITH CONTRAST TECHNIQUE: Multidetector CT imaging of the chest was performed using the standard protocol during bolus administration of intravenous contrast. Multiplanar CT image reconstructions and MIPs were obtained to evaluate the vascular anatomy. Multidetector CT imaging of the abdomen and pelvis was performed using the standard protocol during bolus administration of intravenous contrast. RADIATION DOSE REDUCTION: This exam was performed according to the departmental dose-optimization program which includes automated exposure control, adjustment of the mA and/or kV according to patient size and/or use of  iterative reconstruction technique. CONTRAST:  164m OMNIPAQUE IOHEXOL 350 MG/ML SOLN COMPARISON:  01/22/2021 prior CTs FINDINGS: CTA CHEST FINDINGS Cardiovascular: This is a technically borderline study due to respiratory motion artifact and less than optimal contrast opacification of the pulmonary arteries. No definite pulmonary emboli are identified. UPPER limits normal heart size noted. There is no evidence of thoracic aortic aneurysm. No pericardial effusion. Mediastinum/Nodes: Enlarged mediastinal and LEFT hilar lymph nodes are unchanged with index nodes as follows: A 1.2 cm prevascular node (series 1: Image 25) A 1.2 cm LEFT hilar node (1:32). No new or enlarging lymph nodes are identified since 01/22/2021. The visualized thyroid gland, trachea and esophagus are unremarkable. Lungs/Pleura: Unchanged dominant LEFT UPPER lobe mass again measures 5.8 x 4.4 x 4.5 cm (8:44) and contains a fiducial marker. Numerous adjacent satellite lesions/nodules are again identified. Numerous diffuse bilateral pulmonary nodules are unchanged from the prior study with the an index 8 mm LEFT UPPER lobe nodule (8:60). Mild dependent/basilar atelectasis is noted. No new pulmonary opacities are identified. No pleural effusion or pneumothorax noted. Musculoskeletal: No acute or suspicious bony lesions are identified. Review of the MIP images confirms the above findings. CT ABDOMEN and PELVIS FINDINGS Hepatobiliary: The liver is unremarkable. Cholelithiasis noted with possible mild gallbladder wall thickening, but without definite pericholecystic inflammation. No biliary dilatation. Pancreas: Unremarkable Spleen: Unremarkable Adrenals/Urinary Tract: The kidneys, adrenal glands and bladder are unremarkable except for a punctate nonobstructing LEFT renal calculus. Stomach/Bowel: Circumferential wall thickening of the proximal sigmoid colon with mild adjacent stranding is unchanged. There is no evidence of bowel obstruction or new bowel  wall thickening. There is no evidence of appendicitis. Vascular/Lymphatic: Aortic atherosclerosis. No enlarged abdominal or pelvic lymph nodes. Reproductive: Unremarkable Other: No ascites, focal collection or pneumoperitoneum. Musculoskeletal: No acute findings noted. Review of the MIP images confirms the above findings. IMPRESSION: 1. No evidence of acute abnormality within the chest, abdomen or pelvis. No evidence of pulmonary emboli, but sensitivity is decreased due to technical factors as described above. 2. Cholelithiasis with possible mild gallbladder wall thickening, but without definite pericholecystic inflammation. Consider further evaluation with ultrasound if there is strong clinical suspicion for acute cholecystitis. 3. Unchanged dominant LEFT UPPER lobe malignancy, diffuse bilateral pulmonary metastatic disease/nodules and mediastinal and LEFT hilar lymphadenopathy. 4. Unchanged circumferential wall thickening of the proximal sigmoid colon with mild adjacent stranding, which  may represent malignancy or chronic inflammation/infection. 5. LEFT nephrolithiasis 6. Aortic Atherosclerosis (ICD10-I70.0). Electronically Signed   By: Margarette Canada M.D.   On: 01/24/2021 14:17   CT ABDOMEN PELVIS W CONTRAST  Result Date: 01/24/2021 CLINICAL DATA:  61 year old male with stage IV non-small cell lung cancer with diffuse chest and abdominal pain with cough and intermittent shortness of breath. Chemotherapy in progress. EXAM: CT ANGIOGRAPHY CHEST CT ABDOMEN AND PELVIS WITH CONTRAST TECHNIQUE: Multidetector CT imaging of the chest was performed using the standard protocol during bolus administration of intravenous contrast. Multiplanar CT image reconstructions and MIPs were obtained to evaluate the vascular anatomy. Multidetector CT imaging of the abdomen and pelvis was performed using the standard protocol during bolus administration of intravenous contrast. RADIATION DOSE REDUCTION: This exam was performed  according to the departmental dose-optimization program which includes automated exposure control, adjustment of the mA and/or kV according to patient size and/or use of iterative reconstruction technique. CONTRAST:  13m OMNIPAQUE IOHEXOL 350 MG/ML SOLN COMPARISON:  01/22/2021 prior CTs FINDINGS: CTA CHEST FINDINGS Cardiovascular: This is a technically borderline study due to respiratory motion artifact and less than optimal contrast opacification of the pulmonary arteries. No definite pulmonary emboli are identified. UPPER limits normal heart size noted. There is no evidence of thoracic aortic aneurysm. No pericardial effusion. Mediastinum/Nodes: Enlarged mediastinal and LEFT hilar lymph nodes are unchanged with index nodes as follows: A 1.2 cm prevascular node (series 1: Image 25) A 1.2 cm LEFT hilar node (1:32). No new or enlarging lymph nodes are identified since 01/22/2021. The visualized thyroid gland, trachea and esophagus are unremarkable. Lungs/Pleura: Unchanged dominant LEFT UPPER lobe mass again measures 5.8 x 4.4 x 4.5 cm (8:44) and contains a fiducial marker. Numerous adjacent satellite lesions/nodules are again identified. Numerous diffuse bilateral pulmonary nodules are unchanged from the prior study with the an index 8 mm LEFT UPPER lobe nodule (8:60). Mild dependent/basilar atelectasis is noted. No new pulmonary opacities are identified. No pleural effusion or pneumothorax noted. Musculoskeletal: No acute or suspicious bony lesions are identified. Review of the MIP images confirms the above findings. CT ABDOMEN and PELVIS FINDINGS Hepatobiliary: The liver is unremarkable. Cholelithiasis noted with possible mild gallbladder wall thickening, but without definite pericholecystic inflammation. No biliary dilatation. Pancreas: Unremarkable Spleen: Unremarkable Adrenals/Urinary Tract: The kidneys, adrenal glands and bladder are unremarkable except for a punctate nonobstructing LEFT renal calculus.  Stomach/Bowel: Circumferential wall thickening of the proximal sigmoid colon with mild adjacent stranding is unchanged. There is no evidence of bowel obstruction or new bowel wall thickening. There is no evidence of appendicitis. Vascular/Lymphatic: Aortic atherosclerosis. No enlarged abdominal or pelvic lymph nodes. Reproductive: Unremarkable Other: No ascites, focal collection or pneumoperitoneum. Musculoskeletal: No acute findings noted. Review of the MIP images confirms the above findings. IMPRESSION: 1. No evidence of acute abnormality within the chest, abdomen or pelvis. No evidence of pulmonary emboli, but sensitivity is decreased due to technical factors as described above. 2. Cholelithiasis with possible mild gallbladder wall thickening, but without definite pericholecystic inflammation. Consider further evaluation with ultrasound if there is strong clinical suspicion for acute cholecystitis. 3. Unchanged dominant LEFT UPPER lobe malignancy, diffuse bilateral pulmonary metastatic disease/nodules and mediastinal and LEFT hilar lymphadenopathy. 4. Unchanged circumferential wall thickening of the proximal sigmoid colon with mild adjacent stranding, which may represent malignancy or chronic inflammation/infection. 5. LEFT nephrolithiasis 6. Aortic Atherosclerosis (ICD10-I70.0). Electronically Signed   By: JMargarette CanadaM.D.   On: 01/24/2021 14:17   [facility]  Result  Date: 01/24/2021 CLINICAL DATA:  Sepsis EXAM: PORTABLE CHEST 1 VIEW COMPARISON:  Chest x-ray 09/14/2020, CT chest 01/22/2021 FINDINGS: Heart size and mediastinum are stable and within normal limits. No new consolidation or infiltrates identified in the lungs. 4.4 x 4.2 cm left upper lobe mass again visualized. No pleural effusion or pneumothorax. IMPRESSION: No acute process identified.  Left upper lobe mass. Electronically Signed   By: Ofilia Neas M.D.   On: 01/24/2021 12:41   US Abdomen Limited RUQ (LIVER/GB)  Result Date:  01/24/2021 CLINICAL DATA:  Right upper quadrant pain EXAM: ULTRASOUND ABDOMEN LIMITED RIGHT UPPER QUADRANT COMPARISON:  None. FINDINGS: Gallbladder: Several echogenic shadowing calculi. Evidence of wall adenomyomatosis. No significant wall thickening or surrounding fluid. No sonographic Percell Miller sign reported. Common bile duct: Diameter: 5 mm Liver: No focal lesion identified. Within normal limits in parenchymal echogenicity. Portal vein is patent on color Doppler imaging with normal direction of blood flow towards the liver. Other: None. IMPRESSION: Cholelithiasis and evidence of gallbladder adenomyomatosis. Electronically Signed   By: Ofilia Neas M.D.   On: 01/24/2021 16:09    Procedures .Critical Care Performed by: Loni Beckwith, PA-C Authorized by: Loni Beckwith, PA-C   Critical care provider statement:    Critical care time (minutes):  30   Critical care was necessary to treat or prevent imminent or life-threatening deterioration of the following conditions:  Sepsis   Critical care was time spent personally by me on the following activities:  Development of treatment plan with patient or surrogate, evaluation of patient's response to treatment, examination of patient, ordering and review of laboratory studies, ordering and review of radiographic studies, ordering and performing treatments and interventions, pulse oximetry, re-evaluation of patient's condition, review of old charts and obtaining history from patient or surrogate   Care discussed with: admitting provider      Medications Ordered in ED Medications  lactated ringers infusion (has no administration in time range)  lactated ringers bolus 1,000 mL (has no administration in time range)    And  lactated ringers bolus 1,000 mL (has no administration in time range)    And  lactated ringers bolus 400 mL (has no administration in time range)  ceFEPIme (MAXIPIME) 2 g in sodium chloride 0.9 % 100 mL IVPB (has no  administration in time range)  metroNIDAZOLE (FLAGYL) IVPB 500 mg (has no administration in time range)  vancomycin (VANCOCIN) IVPB 1000 mg/200 mL premix (has no administration in time range)  aspirin chewable tablet 324 mg (324 mg Oral Given 01/24/21 1139)  HYDROmorphone (DILAUDID) injection 1 mg (1 mg Intravenous Given 01/24/21 1205)  ondansetron (ZOFRAN) injection 4 mg (4 mg Intravenous Given 01/24/21 1207)  acetaminophen (TYLENOL) tablet 650 mg (650 mg Oral Given 01/24/21 1205)    ED Course/ Medical Decision Making/ A&P Clinical Course as of 01/24/21 1659  Sun Jan 24, 2021  1441 Spoke to Dr. Marylyn Ishihara who will see the patient for admission [PB]    Clinical Course User Index [PB] Loni Beckwith, PA-C                           Medical Decision Making Amount and/or Complexity of Data Reviewed Labs: ordered. Radiology: ordered. ECG/medicine tests: ordered.  Risk OTC drugs. Prescription drug management. Decision regarding hospitalization.   This patient presents to the ED for concern of abdominal pain and chest pain, this involves an extensive number of treatment options, and is a complaint that carries with  it a high risk of complications and morbidity.  The differential diagnosis includes but is not limited to diverticulitis, gastroenteritis, ACS, PE.   Co morbidities that complicate the patient evaluation  Non-small cell lung cancer, hypertension   Additional history obtained:  Additional history obtained from patient's wife at bedside External records from outside source obtained and reviewed including previous provider notes, lab work, and imaging   Lab Tests:  I Ordered, and personally interpreted labs.  The pertinent results include:   CBC shows leukocytosis and 20.0 CMP shows hypokalemia with potassium at 3.3. UA shows no signs of infection Lactic acid within normal limits    Imaging Studies ordered:  I ordered imaging studies including chest x-ray, CTA  chest, CT abdomen pelvis with contrast I independently visualized and interpreted imaging which showed: Chest x-ray shows left lung mass, no acute abnormality CT abd/pelvis shows cholelithiasis w/ possible mild gallbladder wall thickening without pericholecystic inflammation.  Unchanged circumfrential wall thickening of the proximal sigmoid colon with mild adjacent stranding. CTA chest shows no evidence of PE.  I agree with the radiologist interpretation   Cardiac Monitoring:  The patient was maintained on a cardiac monitor.  I personally viewed and interpreted the cardiac monitored which showed an underlying rhythm of: sinus tachycardia   Medicines ordered and prescription drug management:  I ordered medication including Dilaudid for pain management.  Tylenol for fever.   Reevaluation of the patient after these medicines showed that the patient improved I have reviewed the patients home medicines and have made adjustments as needed   Critical Interventions:  Critical care related to sepsis   Problem List / ED Course:  Abdominal pain Patient has history of diverticulitis.  He has pain to left lower quadrant. Patient has tenderness to left lower quadrant and left upper quadrant on exam. Patient had recent CT abdomen pelvis on 1/19 however pain started after this.  We will repeat imaging at this time due to patient's pain Pain improved after receiving Dilaudid. CT abd pelvis findings as noted above Will order RUQ [place] for further characterization of gallbladder due to thickening Patient met sepsis criteria and was started on broad spectrum abx.  Will consult hospatilist for admission Chest pain Patient endorses shortness of breath associated with his chest pain.  Concern for possible PE as patient has known malignancy at this time.  Will order CTA chest CTA chest findings as noted above Troponin within normal limits   Reevaluation:  After the interventions noted above, I  reevaluated the patient and found that they have :improved   Disposition:  After consideration of the diagnostic results and the patients response to treatment, I feel that the patent would benefit from admission.          Final Clinical Impression(s) / ED Diagnoses Final diagnoses:  RUQ pain    Rx / DC Orders ED Discharge Orders     None         Dyann Ruddle 01/24/21 1710    Milton Ferguson, MD 01/27/21 [phone]

## 2021-01-24 NOTE — Sepsis Progress Note (Signed)
Elink following code sepsis °

## 2021-01-24 NOTE — ED Triage Notes (Signed)
Pt arrived via POV, c/o diffuse abd pain/cramping since yesterday that has moved up to left chest and arm. Denies any nausea or vomiting but does endorse some diarrhea.

## 2021-01-24 NOTE — Progress Notes (Signed)
A consult was received from an ED physician for vancomycin and cefepime per pharmacy dosing.  The patient's profile has been reviewed for ht/wt/allergies/indication/available labs.   A one time order has been placed for vancomycin 2000mg  + vancomycin 500mg  IV for a total dose of 2500mg  IV and cefepime 2g IV x1.  Further antibiotics/pharmacy consults should be ordered by admitting physician if indicated.                       Thank you,  Dimple Nanas, PharmD 01/24/2021 1:05 PM

## 2021-01-24 NOTE — Progress Notes (Signed)
Pharmacy Antibiotic Note  Raymond [name] is a 61 y.o. male with metastatic lung cancer on chemo admitted on 01/24/2021 with sepsis.  Pharmacy has been consulted for vancomycin and cefepime dosing.  Plan: Vancomycin 2500mg  IV x 1 given in ED, continue with 1250mg  IV q12h for estimated AUC 524 using SCr 1.0, VD 0.5 L/kg Check vancomycin levels as needed, goal AUC 400-550 Cefepime 2g IV q8h Flagyl 500mg  IV q12h per MD Follow up renal function & cultures  Height: 5\' 10"  (177.8 cm) Weight: 130.2 kg (287 lb) IBW/kg (Calculated) : 73  Temp (24hrs), Avg:100.1 F (37.8 C), Min:99.7 F (37.6 C), Max:100.7 F (38.2 C)  Recent Labs  Lab 01/24/21 1200  WBC 20.0*  CREATININE 1.00  LATICACIDVEN 1.1    Estimated Creatinine Clearance: 109.2 mL/min (by C-G formula based on SCr of 1 mg/dL).    Allergies  Allergen Reactions   Benadryl [Diphenhydramine] Itching    01/24/21 pt states this was a one time reaction and that he takes now when needed    Antimicrobials this admission:  1/22 Vanc >> 1/22 Cefepime >> 1/22 Flagyl >>  Dose adjustments this admission:   Microbiology results:  1/22 BCx: 1/22 UCx: 1/22 MRSA PCR:   Thank you for allowing pharmacy to be a part of this patients care.  Peggyann Juba, PharmD, BCPS Pharmacy: [phone] 01/24/2021 4:26 PM

## 2021-01-24 NOTE — H&P (Signed)
History and Physical    Raymond [name] VQM:[id number] DOB: 01-Jan-1961 DOA: 01/24/2021  PCP: Pcp, No  Patient coming from: Home  Chief Complaint: Chest pain and LLQ abdominal pain  HPI: Raymond [name] is a 61 y.o. 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 Presenting with chest pain and LLQ abdominal pain. His symptoms started w/ productive cough  and shortness of breath 3 or 4 days ago. He didn't have any fevers at the time. He didn't try any medications. He had a screening CT performed that showed worsening lung disease and inflammation of the proximal sigmoid.  He also reports that he noticed some left lower quadrant abdominal pain during this time that reminded him of his diverticulitis. It was a "grumbly, gassy pain" that moved from his LLQ up to his left flank. His symptoms continued through last night; however at that time, he also noticed a new symptom. He started having stabbing, midsternal chest pain that was constant in nature. It was worse with coughing and deep breathing. The chest pain continued through this morning, so he decided to come to the ED for help.   ED Course: He was noted to be febrile. WBC was 20. CT showed possible sigmoid colitis vs malignancy. He was started on broad spec abx. [facility] was called for admission.   Review of Systems:  Denies palpitations, dyspnea, N/V/D, syncopal episodes, fevers, sick contacts. Review of systems is otherwise negative for all not mentioned in HPI.   PMHx Past Medical History:  Diagnosis Date   Arthritis    Baker's cyst    right   Diverticulitis    GERD (gastroesophageal reflux disease)    Heart attack ([place]) 01/04/2004   History of hiatal hernia    Hypertension    Malignant neoplasm of upper lobe of left lung ([place]) 09/24/2020   Pneumonia    as a child   Sleep apnea    no longer on Cpap    PSHx Past Surgical History:  Procedure Laterality Date   BRONCHIAL BIOPSY  09/14/2020   Procedure:  BRONCHIAL BIOPSIES;  Surgeon: Collene Gobble, MD;  Location: [place];  Service: Cardiopulmonary;;   BRONCHIAL BRUSHINGS  09/14/2020   Procedure: BRONCHIAL BRUSHINGS;  Surgeon: Collene Gobble, MD;  Location: [place];  Service: Cardiopulmonary;;   BRONCHIAL NEEDLE ASPIRATION BIOPSY  09/14/2020   Procedure: BRONCHIAL NEEDLE ASPIRATION BIOPSIES;  Surgeon: Collene Gobble, MD;  Location: [place];  Service: Cardiopulmonary;;   CARDIAC CATHETERIZATION     CARPAL TUNNEL RELEASE     right   FIDUCIAL MARKER PLACEMENT  09/14/2020   Procedure: FIDUCIAL MARKER PLACEMENT;  Surgeon: Collene Gobble, MD;  Location: [place];  Service: Cardiopulmonary;;   HAND SURGERY     left   KNEE SURGERY     left   VIDEO BRONCHOSCOPY N/A 09/14/2020   Procedure: ROBOTIC VIDEO BRONCHOSCOPY WITH FLUORO;  Surgeon: Collene Gobble, MD;  Location: [facility];  Service: Cardiopulmonary;  Laterality: N/A;   VIDEO BRONCHOSCOPY WITH ENDOBRONCHIAL ULTRASOUND N/A 09/14/2020   Procedure: VIDEO BRONCHOSCOPY WITH ENDOBRONCHIAL ULTRASOUND;  Surgeon: Collene Gobble, MD;  Location: [place];  Service: Cardiopulmonary;  Laterality: N/A;   VIDEO BRONCHOSCOPY WITH RADIAL ENDOBRONCHIAL ULTRASOUND  09/14/2020   Procedure: VIDEO BRONCHOSCOPY WITH RADIAL ENDOBRONCHIAL ULTRASOUND;  Surgeon: Collene Gobble, MD;  Location: [facility];  Service: Cardiopulmonary;;    SocHx  reports that he has been smoking cigarettes. He has never used  smokeless tobacco. He reports that he does not drink alcohol and does not use drugs.  Allergies  Allergen Reactions   Benadryl [Diphenhydramine] Itching    01/24/21 pt states this was a one time reaction and that he takes now when needed    FamHx Family History  Problem Relation Age of Onset   Lung cancer Mother    Heart failure Sister     Prior to Admission medications   Medication Sig Start Date End Date Taking? Authorizing Provider  acetaminophen (TYLENOL) 650 MG CR tablet Take  650 mg by mouth every 8 (eight) hours as needed for pain.   Yes [provider]  folic acid (FOLVITE) 1 MG tablet Take 1 tablet (1 mg total) by mouth daily. 11/23/20  Yes Curt Bears, MD  Misc Natural Products (OSTEO BI-FLEX ADV JOINT SHIELD) TABS Take 1 tablet by mouth daily.   Yes [provider]  prochlorperazine (COMPAZINE) 10 MG tablet Take 1 tablet (10 mg total) by mouth every 6 (six) hours as needed for nausea or vomiting. 09/28/20  Yes Curt Bears, MD  simethicone (MYLICON) 219 MG chewable tablet Chew 125 mg by mouth every 6 (six) hours as needed for flatulence.   Yes [provider]  Wheat Dextrin (BENEFIBER DRINK MIX) PACK Take 1 Package by mouth daily at 6 (six) AM.   Yes [provider]  mirtazapine (REMERON) 15 MG tablet Take 1 tablet by mouth at bedtime. Patient not taking: Reported on 01/24/2021 12/14/20   Raymond [name], Raymond [name]    Physical Exam: Vitals:   01/24/21 1110 01/24/21 1215 01/24/21 1300 01/24/21 1410  BP: (!) 154/83 (!) 168/94 (!) 143/81 (!) 179/83  Pulse: (!) 114 93 83 81  Resp: _0 Temp: 99.9 F (37.7 C) (!) 100.7 F (38.2 C)  99.7 F (37.6 C)  TempSrc: Oral Rectal  Rectal  SpO2: 94% 95% 97% 97%  Weight: 130.2 kg     Height: 5' 10" (1.778 m)       General: 61 y.o. male resting in bed in NAD Eyes: PERRL, normal sclera ENMT: Nares patent w/o discharge, orophaynx clear, dentition normal, ears w/o discharge/lesions/ulcers Neck: Supple, trachea midline Cardiovascular: RRR, +S1, S2, no m/g/r, equal pulses throughout; reproducible chest pain Respiratory: slight scattered wheeze, no r/r, normal WOB GI: BS+, ND, LLQ mild TTP, no masses noted, no organomegaly noted MSK: No e/c/c Neuro: A&O x 3, no focal deficits Psyc: Appropriate interaction and affect, calm/cooperative  Labs on Admission: I have personally reviewed following labs and imaging studies  CBC: Recent Labs  Lab 01/24/21 1200  WBC  20.0*  NEUTROABS 15.9*  HGB 10.9*  HCT 34.0*  MCV 97.1  PLT 758   Basic Metabolic Panel: Recent Labs  Lab 01/24/21 1200  NA 139  K 3.3*  CL 106  CO2 24  GLUCOSE 131*  BUN 13  CREATININE 1.00  CALCIUM 8.8*   GFR: Estimated Creatinine Clearance: 109.2 mL/min (by C-G formula based on SCr of 1 mg/dL). Liver Function Tests: Recent Labs  Lab 01/24/21 1200  AST 19  ALT 16  ALKPHOS 80  BILITOT 0.5  PROT 7.6  ALBUMIN 3.4*   No results for input(s): LIPASE, AMYLASE in the last 168 hours. No results for input(s): AMMONIA in the last 168 hours. Coagulation Profile: Recent Labs  Lab 01/24/21 1200  INR 1.1   Cardiac Enzymes: No results for input(s): CKTOTAL, CKMB, CKMBINDEX, TROPONINI in the last 168 hours. BNP (last 3 results) No  results for input(s): PROBNP in the last 8760 hours. HbA1C: No results for input(s): HGBA1C in the last 72 hours. CBG: No results for input(s): GLUCAP in the last 168 hours. Lipid Profile: No results for input(s): CHOL, HDL, LDLCALC, TRIG, CHOLHDL, LDLDIRECT in the last 72 hours. Thyroid Function Tests: No results for input(s): TSH, T4TOTAL, FREET4, T3FREE, THYROIDAB in the last 72 hours. Anemia Panel: No results for input(s): VITAMINB12, FOLATE, FERRITIN, TIBC, IRON, RETICCTPCT in the last 72 hours. Urine analysis:    Component Value Date/Time   COLORURINE AMBER (A) 01/24/2021 1230   APPEARANCEUR CLEAR 01/24/2021 1230   LABSPEC 1.025 01/24/2021 1230   PHURINE 5.0 01/24/2021 1230   GLUCOSEU NEGATIVE 01/24/2021 1230   HGBUR NEGATIVE 01/24/2021 1230   HGBUR moderate 10/17/2007 1442   BILIRUBINUR NEGATIVE 01/24/2021 1230   KETONESUR 5 (A) 01/24/2021 1230   PROTEINUR 30 (A) 01/24/2021 1230   UROBILINOGEN 1.0 07/17/2013 1048   NITRITE NEGATIVE 01/24/2021 1230   LEUKOCYTESUR NEGATIVE 01/24/2021 1230    Radiological Exams on Admission: CT Angio Chest PE W and/or Wo Contrast  Result Date: 01/24/2021 CLINICAL DATA:  61 year old male with  stage IV non-small cell lung cancer with diffuse chest and abdominal pain with cough and intermittent shortness of breath. Chemotherapy in progress. EXAM: CT ANGIOGRAPHY CHEST CT ABDOMEN AND PELVIS WITH CONTRAST TECHNIQUE: Multidetector CT imaging of the chest was performed using the standard protocol during bolus administration of intravenous contrast. Multiplanar CT image reconstructions and MIPs were obtained to evaluate the vascular anatomy. Multidetector CT imaging of the abdomen and pelvis was performed using the standard protocol during bolus administration of intravenous contrast. RADIATION DOSE REDUCTION: This exam was performed according to the departmental dose-optimization program which includes automated exposure control, adjustment of the mA and/or kV according to patient size and/or use of iterative reconstruction technique. CONTRAST:  162m OMNIPAQUE IOHEXOL 350 MG/ML SOLN COMPARISON:  01/22/2021 prior CTs FINDINGS: CTA CHEST FINDINGS Cardiovascular: This is a technically borderline study due to respiratory motion artifact and less than optimal contrast opacification of the pulmonary arteries. No definite pulmonary emboli are identified. UPPER limits normal heart size noted. There is no evidence of thoracic aortic aneurysm. No pericardial effusion. Mediastinum/Nodes: Enlarged mediastinal and LEFT hilar lymph nodes are unchanged with index nodes as follows: A 1.2 cm prevascular node (series 1: Image 25) A 1.2 cm LEFT hilar node (1:32). No new or enlarging lymph nodes are identified since 01/22/2021. The visualized thyroid gland, trachea and esophagus are unremarkable. Lungs/Pleura: Unchanged dominant LEFT UPPER lobe mass again measures 5.8 x 4.4 x 4.5 cm (8:44) and contains a fiducial marker. Numerous adjacent satellite lesions/nodules are again identified. Numerous diffuse bilateral pulmonary nodules are unchanged from the prior study with the an index 8 mm LEFT UPPER lobe nodule (8:60). Mild  dependent/basilar atelectasis is noted. No new pulmonary opacities are identified. No pleural effusion or pneumothorax noted. Musculoskeletal: No acute or suspicious bony lesions are identified. Review of the MIP images confirms the above findings. CT ABDOMEN and PELVIS FINDINGS Hepatobiliary: The liver is unremarkable. Cholelithiasis noted with possible mild gallbladder wall thickening, but without definite pericholecystic inflammation. No biliary dilatation. Pancreas: Unremarkable Spleen: Unremarkable Adrenals/Urinary Tract: The kidneys, adrenal glands and bladder are unremarkable except for a punctate nonobstructing LEFT renal calculus. Stomach/Bowel: Circumferential wall thickening of the proximal sigmoid colon with mild adjacent stranding is unchanged. There is no evidence of bowel obstruction or new bowel wall thickening. There is no evidence of appendicitis. Vascular/Lymphatic: Aortic atherosclerosis. No enlarged abdominal  or pelvic lymph nodes. Reproductive: Unremarkable Other: No ascites, focal collection or pneumoperitoneum. Musculoskeletal: No acute findings noted. Review of the MIP images confirms the above findings. IMPRESSION: 1. No evidence of acute abnormality within the chest, abdomen or pelvis. No evidence of pulmonary emboli, but sensitivity is decreased due to technical factors as described above. 2. Cholelithiasis with possible mild gallbladder wall thickening, but without definite pericholecystic inflammation. Consider further evaluation with ultrasound if there is strong clinical suspicion for acute cholecystitis. 3. Unchanged dominant LEFT UPPER lobe malignancy, diffuse bilateral pulmonary metastatic disease/nodules and mediastinal and LEFT hilar lymphadenopathy. 4. Unchanged circumferential wall thickening of the proximal sigmoid colon with mild adjacent stranding, which may represent malignancy or chronic inflammation/infection. 5. LEFT nephrolithiasis 6. Aortic Atherosclerosis  (ICD10-I70.0). Electronically Signed   By: Margarette Canada M.D.   On: 01/24/2021 14:17   CT ABDOMEN PELVIS W CONTRAST  Result Date: 01/24/2021 CLINICAL DATA:  61 year old male with stage IV non-small cell lung cancer with diffuse chest and abdominal pain with cough and intermittent shortness of breath. Chemotherapy in progress. EXAM: CT ANGIOGRAPHY CHEST CT ABDOMEN AND PELVIS WITH CONTRAST TECHNIQUE: Multidetector CT imaging of the chest was performed using the standard protocol during bolus administration of intravenous contrast. Multiplanar CT image reconstructions and MIPs were obtained to evaluate the vascular anatomy. Multidetector CT imaging of the abdomen and pelvis was performed using the standard protocol during bolus administration of intravenous contrast. RADIATION DOSE REDUCTION: This exam was performed according to the departmental dose-optimization program which includes automated exposure control, adjustment of the mA and/or kV according to patient size and/or use of iterative reconstruction technique. CONTRAST:  160m OMNIPAQUE IOHEXOL 350 MG/ML SOLN COMPARISON:  01/22/2021 prior CTs FINDINGS: CTA CHEST FINDINGS Cardiovascular: This is a technically borderline study due to respiratory motion artifact and less than optimal contrast opacification of the pulmonary arteries. No definite pulmonary emboli are identified. UPPER limits normal heart size noted. There is no evidence of thoracic aortic aneurysm. No pericardial effusion. Mediastinum/Nodes: Enlarged mediastinal and LEFT hilar lymph nodes are unchanged with index nodes as follows: A 1.2 cm prevascular node (series 1: Image 25) A 1.2 cm LEFT hilar node (1:32). No new or enlarging lymph nodes are identified since 01/22/2021. The visualized thyroid gland, trachea and esophagus are unremarkable. Lungs/Pleura: Unchanged dominant LEFT UPPER lobe mass again measures 5.8 x 4.4 x 4.5 cm (8:44) and contains a fiducial marker. Numerous adjacent satellite  lesions/nodules are again identified. Numerous diffuse bilateral pulmonary nodules are unchanged from the prior study with the an index 8 mm LEFT UPPER lobe nodule (8:60). Mild dependent/basilar atelectasis is noted. No new pulmonary opacities are identified. No pleural effusion or pneumothorax noted. Musculoskeletal: No acute or suspicious bony lesions are identified. Review of the MIP images confirms the above findings. CT ABDOMEN and PELVIS FINDINGS Hepatobiliary: The liver is unremarkable. Cholelithiasis noted with possible mild gallbladder wall thickening, but without definite pericholecystic inflammation. No biliary dilatation. Pancreas: Unremarkable Spleen: Unremarkable Adrenals/Urinary Tract: The kidneys, adrenal glands and bladder are unremarkable except for a punctate nonobstructing LEFT renal calculus. Stomach/Bowel: Circumferential wall thickening of the proximal sigmoid colon with mild adjacent stranding is unchanged. There is no evidence of bowel obstruction or new bowel wall thickening. There is no evidence of appendicitis. Vascular/Lymphatic: Aortic atherosclerosis. No enlarged abdominal or pelvic lymph nodes. Reproductive: Unremarkable Other: No ascites, focal collection or pneumoperitoneum. Musculoskeletal: No acute findings noted. Review of the MIP images confirms the above findings. IMPRESSION: 1. No evidence of acute abnormality within the  chest, abdomen or pelvis. No evidence of pulmonary emboli, but sensitivity is decreased due to technical factors as described above. 2. Cholelithiasis with possible mild gallbladder wall thickening, but without definite pericholecystic inflammation. Consider further evaluation with ultrasound if there is strong clinical suspicion for acute cholecystitis. 3. Unchanged dominant LEFT UPPER lobe malignancy, diffuse bilateral pulmonary metastatic disease/nodules and mediastinal and LEFT hilar lymphadenopathy. 4. Unchanged circumferential wall thickening of the  proximal sigmoid colon with mild adjacent stranding, which may represent malignancy or chronic inflammation/infection. 5. LEFT nephrolithiasis 6. Aortic Atherosclerosis (ICD10-I70.0). Electronically Signed   By: Margarette Canada M.D.   On: 01/24/2021 14:17   DG Chest Port 1 View  Result Date: 01/24/2021 CLINICAL DATA:  Sepsis EXAM: PORTABLE CHEST 1 VIEW COMPARISON:  Chest x-ray 09/14/2020, CT chest 01/22/2021 FINDINGS: Heart size and mediastinum are stable and within normal limits. No new consolidation or infiltrates identified in the lungs. 4.4 x 4.2 cm left upper lobe mass again visualized. No pleural effusion or pneumothorax. IMPRESSION: No acute process identified.  Left upper lobe mass. Electronically Signed   By: Ofilia Neas M.D.   On: 01/24/2021 12:41    EKG: Independently reviewed. Sinus tachy, no st elevations  Assessment/Plan Sepsis secondary to unknown organism LLQ abdominal pain     - admit to inpt, progressive     - he reports LLQ abdominal pain, imaging shows chronic inflammation vs colonic malignancy     - will continue broad spec abx for now     - spoke with onco; says it's odd for a primary lung to met to prox sigmoid; they will follow; appreciate assistance     - spoke with [facility]; will treat for infection, they will see him as he may need flex sig; appreciate assistance     - RUQ [place] ordered in ED; follow  Chest pain     - EKG normal and trp are normal     - chest pain is reproducible; he's had several days of significant cough     - pleuritic picture; will add anti-inflammatory meds  NSCLC stage 4 on chemo     - follows w/ Dr. Julien Nordmann; [facility] to follow     - let's get palliative to see given new CT findings  Hypokalemia     - replace K+, check Mg2+  Normocytic anemia     - no evidence of bleed, follow  Morbid obesity     - counsel on diet, lifestyle changes  HTN     - not on home meds     - will add PRNs for now  DVT prophylaxis: lovenox  Code Status: FULL   Family Communication: None at bedside  Consults called: Oncology (Dr. Chryl Heck) and [facility] (Dr. Alessandra Bevels)   Status is: Inpatient  Remains inpatient appropriate because: severity of illness  Time spent coordinating admission: 80 minutes  [facility]  If 7PM-7AM, please contact night-coverage [email or web address]  01/24/2021, 2:58 PM

## 2021-01-25 ENCOUNTER — Telehealth: Payer: Self-pay

## 2021-01-25 ENCOUNTER — Ambulatory Visit: Payer: Self-pay | Admitting: Internal Medicine

## 2021-01-25 ENCOUNTER — Other Ambulatory Visit: Payer: Self-pay

## 2021-01-25 ENCOUNTER — Inpatient Hospital Stay: Payer: 59

## 2021-01-25 ENCOUNTER — Ambulatory Visit: Payer: Self-pay

## 2021-01-25 DIAGNOSIS — E876 Hypokalemia: Secondary | ICD-10-CM | POA: Diagnosis present

## 2021-01-25 DIAGNOSIS — K529 Noninfective gastroenteritis and colitis, unspecified: Secondary | ICD-10-CM | POA: Diagnosis present

## 2021-01-25 LAB — PROTIME-INR
INR: 1.2 (ref 0.8–1.2)
Prothrombin Time: 15.2 seconds (ref 11.4–15.2)

## 2021-01-25 LAB — URINE CULTURE

## 2021-01-25 LAB — CORTISOL-AM, BLOOD: Cortisol - AM: 7.8 ug/dL (ref 6.7–22.6)

## 2021-01-25 LAB — CBC
HCT: 29.3 % — ABNORMAL LOW (ref 39.0–52.0)
Hemoglobin: 9.3 g/dL — ABNORMAL LOW (ref 13.0–17.0)
MCH: 31.4 pg (ref 26.0–34.0)
MCHC: 31.7 g/dL (ref 30.0–36.0)
MCV: 99 fL (ref 80.0–100.0)
Platelets: 256 10*3/uL (ref 150–400)
RBC: 2.96 MIL/uL — ABNORMAL LOW (ref 4.22–5.81)
RDW: 15.8 % — ABNORMAL HIGH (ref 11.5–15.5)
WBC: 20.3 10*3/uL — ABNORMAL HIGH (ref 4.0–10.5)
nRBC: 0 % (ref 0.0–0.2)

## 2021-01-25 LAB — COMPREHENSIVE METABOLIC PANEL
ALT: 12 U/L (ref 0–44)
AST: 16 U/L (ref 15–41)
Albumin: 2.9 g/dL — ABNORMAL LOW (ref 3.5–5.0)
Alkaline Phosphatase: 66 U/L (ref 38–126)
Anion gap: 5 (ref 5–15)
BUN: 10 mg/dL (ref 6–20)
CO2: 25 mmol/L (ref 22–32)
Calcium: 8.4 mg/dL — ABNORMAL LOW (ref 8.9–10.3)
Chloride: 106 mmol/L (ref 98–111)
Creatinine, Ser: 0.83 mg/dL (ref 0.61–1.24)
GFR, Estimated: >60 mL/min (ref >60)
Glucose, Bld: 104 mg/dL — ABNORMAL HIGH (ref 70–99)
Potassium: 3.9 mmol/L (ref 3.5–5.1)
Sodium: 136 mmol/L (ref 135–145)
Total Bilirubin: 0.8 mg/dL (ref 0.3–1.2)
Total Protein: 6.4 g/dL — ABNORMAL LOW (ref 6.5–8.1)

## 2021-01-25 LAB — SEDIMENTATION RATE: Sed Rate: 66 mm/hr — ABNORMAL HIGH (ref 0–16)

## 2021-01-25 LAB — C-REACTIVE PROTEIN: CRP: 17.2 mg/dL — ABNORMAL HIGH (ref <1.0)

## 2021-01-25 LAB — PROCALCITONIN: Procalcitonin: <0.1 ng/mL

## 2021-01-25 MED ORDER — PEG 3350-KCL-NA BICARB-NACL 420 G PO SOLR
4000.0000 mL | Freq: Once | ORAL | Status: AC
Start: 2021-01-25 — End: 2021-01-25
  Administered 2021-01-25: 4000 mL via ORAL

## 2021-01-25 MED ORDER — FLEET ENEMA 7-19 GM/118ML RE ENEM: 1.0000 | ENEMA | Freq: Once | RECTAL | Status: DC

## 2021-01-25 MED ORDER — OXYCODONE HCL 5 MG PO TABS
5.0000 mg | ORAL_TABLET | ORAL | Status: DC | PRN
  Administered 2021-01-25 (×3): 5 mg via ORAL
  Filled 2021-01-25 (×3): qty 1

## 2021-01-25 MED ORDER — SODIUM CHLORIDE 0.9 % IV SOLN: INTRAVENOUS | Status: AC

## 2021-01-25 MED ORDER — FLEET ENEMA 7-19 GM/118ML RE ENEM
1.0000 | ENEMA | Freq: Once | RECTAL | Status: DC
Start: 2021-01-26 — End: 2021-01-25

## 2021-01-25 NOTE — H&P (View-Only) (Signed)
Referring Provider: [facility] Primary Care Physician:  Pcp, No Primary Gastroenterologist:  Althia Forts  Reason for Consultation:  LLQ abdominal pain  HPI: Raymond [name] is a 61 y.o. 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 Reports history of diverticulitis (most recent being one year ago). He states he thought his LLQ pain was a diverticulitis flare up. Reports "grumbling" in the left lower abdomen. States he took stool softeners and benefiber to try to help. Had a loose stool 1/21. He then woke up and was getting ready for church 1/22 when his abdominal pain began to become more persistent and he developed chest pain and shortness of breath. This prompted him to come to the hospital. Denies melena/hematochezia. Denies weight loss. Denies nausea/vomiting. Denies fever/chills. Denies family history of colon cancer. Denies alcohol/tobacco use. States he has been taking 1 BC powder daily for "many many years" for his arthritis.  Reports having had a colonoscopy 15 years ago, states it was done across the street from [facility] but unable to recall name of group. No report seen in Epic. Patient also does not recall results of colonoscopy.  Past Medical History:  Diagnosis Date   Arthritis    Baker's cyst    right   Diverticulitis    GERD (gastroesophageal reflux disease)    Heart attack ([place]) 01/04/2004   History of hiatal hernia    Hypertension    Malignant neoplasm of upper lobe of left lung ([place]) 09/24/2020   Pneumonia    as a child   Sleep apnea    no longer on Cpap    Past Surgical History:  Procedure Laterality Date   BRONCHIAL BIOPSY  09/14/2020   Procedure: BRONCHIAL BIOPSIES;  Surgeon: Collene Gobble, MD;  Location: [place];  Service: Cardiopulmonary;;   BRONCHIAL BRUSHINGS  09/14/2020   Procedure: BRONCHIAL BRUSHINGS;  Surgeon: Collene Gobble, MD;   Location: [place];  Service: Cardiopulmonary;;   BRONCHIAL NEEDLE ASPIRATION BIOPSY  09/14/2020   Procedure: BRONCHIAL NEEDLE ASPIRATION BIOPSIES;  Surgeon: Collene Gobble, MD;  Location: [place];  Service: Cardiopulmonary;;   CARDIAC CATHETERIZATION     CARPAL TUNNEL RELEASE     right   FIDUCIAL MARKER PLACEMENT  09/14/2020   Procedure: FIDUCIAL MARKER PLACEMENT;  Surgeon: Collene Gobble, MD;  Location: [place];  Service: Cardiopulmonary;;   HAND SURGERY     left   KNEE SURGERY     left   VIDEO BRONCHOSCOPY N/A 09/14/2020   Procedure: ROBOTIC VIDEO BRONCHOSCOPY WITH FLUORO;  Surgeon: Collene Gobble, MD;  Location: [place];  Service: Cardiopulmonary;  Laterality: N/A;   VIDEO BRONCHOSCOPY WITH ENDOBRONCHIAL ULTRASOUND N/A 09/14/2020   Procedure: VIDEO BRONCHOSCOPY WITH ENDOBRONCHIAL ULTRASOUND;  Surgeon: Collene Gobble, MD;  Location: [place];  Service: Cardiopulmonary;  Laterality: N/A;   VIDEO BRONCHOSCOPY WITH RADIAL ENDOBRONCHIAL ULTRASOUND  09/14/2020   Procedure: VIDEO BRONCHOSCOPY WITH RADIAL ENDOBRONCHIAL ULTRASOUND;  Surgeon: Collene Gobble, MD;  Location: [facility];  Service: Cardiopulmonary;;    Prior to Admission medications   Medication Sig Start Date End Date Taking? Authorizing Provider  acetaminophen (TYLENOL) 650 MG CR tablet Take 650 mg by mouth every 8 (eight) hours as needed for pain.   Yes [provider]  folic acid (FOLVITE) 1 MG tablet Take 1 tablet (1 mg total) by mouth daily. 11/23/20  Yes  Curt Bears, MD  Misc Natural Products (OSTEO BI-FLEX ADV JOINT SHIELD) TABS Take 1 tablet by mouth daily.   Yes [provider]  prochlorperazine (COMPAZINE) 10 MG tablet Take 1 tablet (10 mg total) by mouth every 6 (six) hours as needed for nausea or vomiting. 09/28/20  Yes Curt Bears, MD  simethicone (MYLICON) 557 MG chewable tablet Chew 125 mg by mouth every 6 (six) hours as needed for flatulence.   Yes [provider]  Wheat Dextrin (BENEFIBER DRINK MIX) PACK Take 1 Package by mouth daily at 6 (six) AM.   Yes [provider]  mirtazapine (REMERON) 15 MG tablet Take 1 tablet by mouth at bedtime. Patient not taking: Reported on 01/24/2021 12/14/20   [name], [name], PA-C    Scheduled Meds:  enoxaparin (LOVENOX) injection  60 mg Subcutaneous D22G   folic acid  1 mg Oral Daily   guaiFENesin  600 mg Oral BID   potassium chloride  40 mEq Oral BID   Continuous Infusions:  ceFEPime (MAXIPIME) IV 2 g (01/25/21 0615)   metronidazole 500 mg (01/25/21 0849)   vancomycin     PRN Meds:.acetaminophen **OR** acetaminophen, albuterol, hydrALAZINE, morphine injection, oxyCODONE, prochlorperazine  Allergies as of 01/24/2021 - Review Complete 01/24/2021  Allergen Reaction Noted   Benadryl [diphenhydramine] Itching 10/12/2020    Family History  Problem Relation Age of Onset   Lung cancer Mother    Heart failure Sister     Social History   Socioeconomic History   Marital status: Married    Spouse name: Not on file   Number of children: Not on file   Years of education: Not on file   Highest education level: Not on file  Occupational History   Not on file  Tobacco Use   Smoking status: Every Day    Types: Cigarettes   Smokeless tobacco: Never  Vaping Use   Vaping Use: Never used  Substance and Sexual Activity   Alcohol use: No   Drug use: No   Sexual activity: Not on file  Other Topics Concern   Not on file  Social History Narrative   Not on file   Social Determinants of Health   Financial Resource Strain: High Risk   Difficulty of Paying Living Expenses: Hard  Food Insecurity: No Food Insecurity   Worried About Running Out of Food in the Last Year: Never true   Ran Out of Food in the Last Year: Never true  Transportation Needs: No Transportation Needs   Lack of Transportation (Medical): No   Lack of Transportation (Non-Medical): No  Physical Activity:  Not on file  Stress: Stress Concern Present   Feeling of Stress : Very much  Social Connections: Socially Integrated   Frequency of Communication with Friends and Family: More than three times a week   Frequency of Social Gatherings with Friends and Family: More than three times a week   Attends Religious Services: More than 4 times per year   Active Member of [employer] or Organizations: Yes   Attends Music therapist: More than 4 times per year   Marital Status: Married  Human resources officer Violence: Not on file    Review of Systems: Review of Systems  Constitutional:  Negative for chills and fever.  HENT:  Negative for hearing loss and tinnitus.   Eyes:  Negative for blurred vision.  Respiratory:  Positive for shortness of breath. Negative for hemoptysis.   Cardiovascular:  Positive for chest pain. Negative for  palpitations.  Gastrointestinal:  Positive for abdominal pain. Negative for blood in stool, constipation, diarrhea, heartburn, melena, nausea and vomiting.  Genitourinary:  Negative for dysuria and urgency.  Musculoskeletal:  Negative for myalgias and neck pain.  Skin:  Negative for itching and rash.  Neurological:  Negative for seizures and loss of consciousness.  Psychiatric/Behavioral:  Negative for substance abuse. The patient is not nervous/anxious.     Physical Exam:Physical Exam Constitutional:      Appearance: He is obese.  HENT:     Head: Normocephalic and atraumatic.     Nose: Nose normal. No congestion.     Mouth/Throat:     Mouth: Mucous membranes are moist.     Pharynx: Oropharynx is clear.  Eyes:     Extraocular Movements: Extraocular movements intact.     Conjunctiva/sclera: Conjunctivae normal.  Cardiovascular:     Rate and Rhythm: Normal rate and regular rhythm.  Pulmonary:     Effort: Pulmonary effort is normal. No respiratory distress.  Abdominal:     General: Bowel sounds are normal. There is no distension.     Palpations: Abdomen is  soft. There is no mass.     Tenderness: There is abdominal tenderness (LLQ). There is no guarding or rebound.     Hernia: No hernia is present.  Musculoskeletal:        General: No swelling. Normal range of motion.     Cervical back: Normal range of motion and neck supple.  Skin:    General: Skin is warm and dry.  Neurological:     General: No focal deficit present.     Mental Status: He is alert and oriented to person, place, and time.  Psychiatric:        Mood and Affect: Mood normal.        Behavior: Behavior normal.        Thought Content: Thought content normal.        Judgment: Judgment normal.    Vital signs: Vitals:   01/25/21 0224 01/25/21 0540  BP: (!) 157/80 (!) 143/74  Pulse: 99 82  Resp: 17 20  Temp: 100.2 F (37.9 C) 98.2 F (36.8 C)  SpO2: 92% 95%   Last BM Date: 01/23/21    GI:  Lab Results: Recent Labs    01/24/21 1200 01/25/21 0344  WBC 20.0* 20.3*  HGB 10.9* 9.3*  HCT 34.0* 29.3*  PLT 303 256   BMET Recent Labs    01/24/21 1200 01/25/21 0344  NA 139 136  K 3.3* 3.9  CL 106 106  CO2 24 25  GLUCOSE 131* 104*  BUN 13 10  CREATININE 1.00 0.83  CALCIUM 8.8* 8.4*   LFT Recent Labs    01/25/21 0344  PROT 6.4*  ALBUMIN 2.9*  AST 16  ALT 12  ALKPHOS 66  BILITOT 0.8   PT/INR Recent Labs    01/24/21 1200 01/25/21 0344  LABPROT 13.8 15.2  INR 1.1 1.2     Studies/Results: CT Angio Chest PE W and/or Wo Contrast  Result Date: 01/24/2021 CLINICAL DATA:  61 year old male with stage IV non-small cell lung cancer with diffuse chest and abdominal pain with cough and intermittent shortness of breath. Chemotherapy in progress. EXAM: CT ANGIOGRAPHY CHEST CT ABDOMEN AND PELVIS WITH CONTRAST TECHNIQUE: Multidetector CT imaging of the chest was performed using the standard protocol during bolus administration of intravenous contrast. Multiplanar CT image reconstructions and MIPs were obtained to evaluate the vascular anatomy. Multidetector CT  imaging of the  abdomen and pelvis was performed using the standard protocol during bolus administration of intravenous contrast. RADIATION DOSE REDUCTION: This exam was performed according to the departmental dose-optimization program which includes automated exposure control, adjustment of the mA and/or kV according to patient size and/or use of iterative reconstruction technique. CONTRAST:  138mL OMNIPAQUE IOHEXOL 350 MG/ML SOLN COMPARISON:  01/22/2021 prior CTs FINDINGS: CTA CHEST FINDINGS Cardiovascular: This is a technically borderline study due to respiratory motion artifact and less than optimal contrast opacification of the pulmonary arteries. No definite pulmonary emboli are identified. UPPER limits normal heart size noted. There is no evidence of thoracic aortic aneurysm. No pericardial effusion. Mediastinum/Nodes: Enlarged mediastinal and LEFT hilar lymph nodes are unchanged with index nodes as follows: A 1.2 cm prevascular node (series 1: Image 25) A 1.2 cm LEFT hilar node (1:32). No new or enlarging lymph nodes are identified since 01/22/2021. The visualized thyroid gland, trachea and esophagus are unremarkable. Lungs/Pleura: Unchanged dominant LEFT UPPER lobe mass again measures 5.8 x 4.4 x 4.5 cm (8:44) and contains a fiducial marker. Numerous adjacent satellite lesions/nodules are again identified. Numerous diffuse bilateral pulmonary nodules are unchanged from the prior study with the an index 8 mm LEFT UPPER lobe nodule (8:60). Mild dependent/basilar atelectasis is noted. No new pulmonary opacities are identified. No pleural effusion or pneumothorax noted. Musculoskeletal: No acute or suspicious bony lesions are identified. Review of the MIP images confirms the above findings. CT ABDOMEN and PELVIS FINDINGS Hepatobiliary: The liver is unremarkable. Cholelithiasis noted with possible mild gallbladder wall thickening, but without definite pericholecystic inflammation. No biliary dilatation. Pancreas:  Unremarkable Spleen: Unremarkable Adrenals/Urinary Tract: The kidneys, adrenal glands and bladder are unremarkable except for a punctate nonobstructing LEFT renal calculus. Stomach/Bowel: Circumferential wall thickening of the proximal sigmoid colon with mild adjacent stranding is unchanged. There is no evidence of bowel obstruction or new bowel wall thickening. There is no evidence of appendicitis. Vascular/Lymphatic: Aortic atherosclerosis. No enlarged abdominal or pelvic lymph nodes. Reproductive: Unremarkable Other: No ascites, focal collection or pneumoperitoneum. Musculoskeletal: No acute findings noted. Review of the MIP images confirms the above findings. IMPRESSION: 1. No evidence of acute abnormality within the chest, abdomen or pelvis. No evidence of pulmonary emboli, but sensitivity is decreased due to technical factors as described above. 2. Cholelithiasis with possible mild gallbladder wall thickening, but without definite pericholecystic inflammation. Consider further evaluation with ultrasound if there is strong clinical suspicion for acute cholecystitis. 3. Unchanged dominant LEFT UPPER lobe malignancy, diffuse bilateral pulmonary metastatic disease/nodules and mediastinal and LEFT hilar lymphadenopathy. 4. Unchanged circumferential wall thickening of the proximal sigmoid colon with mild adjacent stranding, which may represent malignancy or chronic inflammation/infection. 5. LEFT nephrolithiasis 6. Aortic Atherosclerosis (ICD10-I70.0). Electronically Signed   By: Margarette Canada M.D.   On: 01/24/2021 14:17   CT ABDOMEN PELVIS W CONTRAST  Result Date: 01/24/2021 CLINICAL DATA:  61 year old male with stage IV non-small cell lung cancer with diffuse chest and abdominal pain with cough and intermittent shortness of breath. Chemotherapy in progress. EXAM: CT ANGIOGRAPHY CHEST CT ABDOMEN AND PELVIS WITH CONTRAST TECHNIQUE: Multidetector CT imaging of the chest was performed using the standard protocol  during bolus administration of intravenous contrast. Multiplanar CT image reconstructions and MIPs were obtained to evaluate the vascular anatomy. Multidetector CT imaging of the abdomen and pelvis was performed using the standard protocol during bolus administration of intravenous contrast. RADIATION DOSE REDUCTION: This exam was performed according to the departmental dose-optimization program which includes automated exposure control, adjustment of the  mA and/or kV according to patient size and/or use of iterative reconstruction technique. CONTRAST:  160mL OMNIPAQUE IOHEXOL 350 MG/ML SOLN COMPARISON:  01/22/2021 prior CTs FINDINGS: CTA CHEST FINDINGS Cardiovascular: This is a technically borderline study due to respiratory motion artifact and less than optimal contrast opacification of the pulmonary arteries. No definite pulmonary emboli are identified. UPPER limits normal heart size noted. There is no evidence of thoracic aortic aneurysm. No pericardial effusion. Mediastinum/Nodes: Enlarged mediastinal and LEFT hilar lymph nodes are unchanged with index nodes as follows: A 1.2 cm prevascular node (series 1: Image 25) A 1.2 cm LEFT hilar node (1:32). No new or enlarging lymph nodes are identified since 01/22/2021. The visualized thyroid gland, trachea and esophagus are unremarkable. Lungs/Pleura: Unchanged dominant LEFT UPPER lobe mass again measures 5.8 x 4.4 x 4.5 cm (8:44) and contains a fiducial marker. Numerous adjacent satellite lesions/nodules are again identified. Numerous diffuse bilateral pulmonary nodules are unchanged from the prior study with the an index 8 mm LEFT UPPER lobe nodule (8:60). Mild dependent/basilar atelectasis is noted. No new pulmonary opacities are identified. No pleural effusion or pneumothorax noted. Musculoskeletal: No acute or suspicious bony lesions are identified. Review of the MIP images confirms the above findings. CT ABDOMEN and PELVIS FINDINGS Hepatobiliary: The liver is  unremarkable. Cholelithiasis noted with possible mild gallbladder wall thickening, but without definite pericholecystic inflammation. No biliary dilatation. Pancreas: Unremarkable Spleen: Unremarkable Adrenals/Urinary Tract: The kidneys, adrenal glands and bladder are unremarkable except for a punctate nonobstructing LEFT renal calculus. Stomach/Bowel: Circumferential wall thickening of the proximal sigmoid colon with mild adjacent stranding is unchanged. There is no evidence of bowel obstruction or new bowel wall thickening. There is no evidence of appendicitis. Vascular/Lymphatic: Aortic atherosclerosis. No enlarged abdominal or pelvic lymph nodes. Reproductive: Unremarkable Other: No ascites, focal collection or pneumoperitoneum. Musculoskeletal: No acute findings noted. Review of the MIP images confirms the above findings. IMPRESSION: 1. No evidence of acute abnormality within the chest, abdomen or pelvis. No evidence of pulmonary emboli, but sensitivity is decreased due to technical factors as described above. 2. Cholelithiasis with possible mild gallbladder wall thickening, but without definite pericholecystic inflammation. Consider further evaluation with ultrasound if there is strong clinical suspicion for acute cholecystitis. 3. Unchanged dominant LEFT UPPER lobe malignancy, diffuse bilateral pulmonary metastatic disease/nodules and mediastinal and LEFT hilar lymphadenopathy. 4. Unchanged circumferential wall thickening of the proximal sigmoid colon with mild adjacent stranding, which may represent malignancy or chronic inflammation/infection. 5. LEFT nephrolithiasis 6. Aortic Atherosclerosis (ICD10-I70.0). Electronically Signed   By: Margarette Canada M.D.   On: 01/24/2021 14:17   DG Chest Port 1 View  Result Date: 01/24/2021 CLINICAL DATA:  Sepsis EXAM: PORTABLE CHEST 1 VIEW COMPARISON:  Chest x-ray 09/14/2020, CT chest 01/22/2021 FINDINGS: Heart size and mediastinum are stable and within normal limits. No  new consolidation or infiltrates identified in the lungs. 4.4 x 4.2 cm left upper lobe mass again visualized. No pleural effusion or pneumothorax. IMPRESSION: No acute process identified.  Left upper lobe mass. Electronically Signed   By: Ofilia Neas M.D.   On: 01/24/2021 12:41   US Abdomen Limited RUQ (LIVER/GB)  Result Date: 01/24/2021 CLINICAL DATA:  Right upper quadrant pain EXAM: ULTRASOUND ABDOMEN LIMITED RIGHT UPPER QUADRANT COMPARISON:  None. FINDINGS: Gallbladder: Several echogenic shadowing calculi. Evidence of wall adenomyomatosis. No significant wall thickening or surrounding fluid. No sonographic Percell Miller sign reported. Common bile duct: Diameter: 5 mm Liver: No focal lesion identified. Within normal limits in parenchymal echogenicity. Portal vein is patent  on color Doppler imaging with normal direction of blood flow towards the liver. Other: None. IMPRESSION: Cholelithiasis and evidence of gallbladder adenomyomatosis. Electronically Signed   By: Ofilia Neas M.D.   On: 01/24/2021 16:09    Impression: LLQ pain - CT ab/pelvis with contrast 1/22: cholelithiasis with mild wall thickening (suspicious for cholecystitis). Unchanged LU lobe malignancy. Circumferential wall thickening of proximal sigmoid colon with mild adjacent stranding (malignancy vs chronic inflammation). - US abdomen 1/22: cholelithiasis and evidence of gallbladder adenomyomatosis - Normal LFTs - Leykocytosis with WBC 20.3 - Hgb 9.3 (10.9 yesterday).  Plan: Plan for flexible sigmoidoscopy tomorrow. I thoroughly discussed the procedures to include nature, alternatives, benefits, and risks including but not limited to bleeding, perforation, infection, anesthesia/cardiac and pulmonary complications. Patient provides understanding and gave verbal consent to proceed.   Nulytely prep  Continue supportive care management.   [facility] will follow.       LOS: 1 day   Garnette Scheuermann  PA-C 01/25/2021, 9:16  AM  Contact #  [phone]

## 2021-01-25 NOTE — Progress Notes (Signed)
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  Patient also with occasional diarrhea, productive cough of white/green sputum, and shortness of breath.  Onset of symptoms roughly 3-4 days prior.  Abdominal pain is worse in the left lower quadrant and after no significant improvement of his symptoms at home and now with stabbing midsternal chest pain that was constant and worse with coughing and deep breaths, patient presented to the ED for further evaluation.  In the ED, temperature 100.7 F, HR 114, RR 20, BP 154/83, SPO2 94% on room air.  Sodium 139, potassium 3.3, chloride 106, CO2 24, glucose 131, BUN 13, creatinine 1.00.  AST 19, ALT 16, total bilirubin 0.5.  Lactic acid 1.1.  WBC 20.0, hemoglobin 10.9, platelets 303.  COVID-19 PCR negative.  Influenza A/B PCR negative.  Urinalysis unrevealing.  CTA chest/abdomen/pelvis with no pulmonary embolism, cholelithiasis with possible mild gallbladder wall thickening without definite pericholecystic inflammation, unchanged left upper lobe malignancy, circumferential wall thickening proximal sigmoid colon with adjacent stranding concerning for malignancy versus chronic inflammation versus infection.  Right upper quadrant ultrasound with cholelithiasis and evidence of gallbladder adenomyomatosis.   Assessment & Plan:   Principal Problem:   Sepsis ([place])  Sepsis, POA Sigmoid colitis versus malignancy versus inflammatory process Patient met sepsis criteria on admission with tachypnea, tachycardia, fever, leukocytosis source of  infection noted on imaging consistent with sigmoid colitis.  Urinalysis unrevealing.  Lactic acid elevated 2.1. --[facility] following; appreciate assistance --WBC 20.0>20.3 --Blood cultures x2: No growth less than 24 hours --Urine culture: Pending --Check ESR/CRP --Cefepime 2 g IV q8h --Metronidazole 552m IV q12h --GI plans flex sigmoidoscopy 1/23, n.p.o. after midnight  Atypical chest pain Patient complaining of constant chest discomfort left-sided chest.  CT angiogram chest negative for pulmonary embolism.  High sensitive troponins within normal limits.  ED with sinus tachycardia, rate 102, T wave inversions in leads II, 3, aVF, V5-V6 which are similar in appearance to EKG August 2022.  Suspect etiology likely secondary to costochondritis from significant chronic cough versus pain of malignancy with left upper lobe mass. --Continue monitor on telemetry  Hypokalemia Potassium 3.3 on admission, repleted.  Repeat potassium this a.m. 3.9. --Repeat BMP in a.m.  Essential hypertension Not on antihypertensives outpatient. --Hydralazine as needed  Stage IV (T2b, N2, M1c) non-small cell lung cancer/adenocarcinoma with brain metastasis Follows with medical/radiation oncology outpatient, Dr. MEarlie Serverand Dr. PDara Lords  Currently on chemotherapy.  Morbid obesity Body mass index is 41.18 kg/m.  Discussed with patient needs for aggressive lifestyle changes/weight loss as this complicates all facets of care.  Outpatient follow-up with PCP.     DVT prophylaxis: Lovenox   Code Status: Full Code Family Communication: No family present at bedside  Disposition Plan:  Level of care: Progressive Status is: Inpatient  Remains inpatient appropriate because: Sepsis, IV antibiotics, pending flex sigmoidoscopy tomorrow, awaiting further recommendations per GI   Consultants:  [facility]  Procedures:  none  Antimicrobials:  Vancomycin 1/22 - 1/23 Cefepime 1/22>> Metronidazole  1/22>>   Subjective: Patient seen examined at bedside, resting comfortably.  Left lower quadrant pain somewhat improved  since yesterday.  Continues with occasional left-sided chest wall pain worse with deep inspiration.  Seen by GI this morning, plan for flex sigmoidoscopy tomorrow.  No other questions or concerns at this time.  Denies headache, no fever/chills/night sweats currently, no nausea/vomiting/diarrhea, no palpitations, no cough/congestion.  No acute events overnight per nursing staff.  Objective: Vitals:   01/24/21 2223 01/24/21 2300 01/25/21 0224 01/25/21 0540  BP: (!) 162/78  (!) 157/80 (!) 143/74  Pulse: 91  99 82  Resp: _0 Temp: (!) 100.7 F (38.2 C)  100.2 F (37.9 C) 98.2 F (36.8 C)  TempSrc: Oral  Oral Oral  SpO2: 95%  92% 95%  Weight:      Height:        Intake/Output Summary (Last 24 hours) at 01/25/2021 1311 Last data filed at 01/25/2021 0900 Gross per 24 hour  Intake 2840 ml  Output 700 ml  Net 2140 ml   Filed Weights   01/24/21 1110  Weight: 130.2 kg    Examination:  General exam: Appears calm and comfortable, obese Respiratory system: Breath sounds left upper lobe, otherwise clear to auscultation bilaterally without wheezing/crackles, normal [place] effort without accessory muscle use, on room air.  Cardiovascular system: S1 & S2 heard, RRR. No JVD, murmurs, rubs, gallops or clicks. No pedal edema. Gastrointestinal system: Abdomen is nondistended, soft, slight tenderness to palpation left lower quadrant. No organomegaly or masses felt. Normal bowel sounds heard. Central nervous system: Alert and oriented. No focal neurological deficits. Extremities: Symmetric 5 x 5 power. Skin: No rashes, lesions or ulcers Psychiatry: Judgement and insight appear normal. Mood & affect appropriate.     Data Reviewed: I have personally reviewed following labs and imaging studies  CBC: Recent Labs  Lab 01/24/21 1200 01/25/21 0344  WBC 20.0* 20.3*   NEUTROABS 15.9*  --   HGB 10.9* 9.3*  HCT 34.0* 29.3*  MCV 97.1 99.0  PLT 303 161   Basic Metabolic Panel: Recent Labs  Lab 01/24/21 1200 01/24/21 1852 01/25/21 0344  NA 139  --  136  K 3.3*  --  3.9  CL 106  --  106  CO2 24  --  25  GLUCOSE 131*  --  104*  BUN 13  --  10  CREATININE 1.00  --  0.83  CALCIUM 8.8*  --  8.4*  MG  --  2.0  --    GFR: Estimated Creatinine Clearance: 131.6 mL/min (by C-G formula based on SCr of 0.83 mg/dL). Liver Function Tests: Recent Labs  Lab 01/24/21 1200 01/25/21 0344  AST 19 16  ALT 16 12  ALKPHOS 80 66  BILITOT 0.5 0.8  PROT 7.6 6.4*  ALBUMIN 3.4* 2.9*   No results for input(s): LIPASE, AMYLASE in the last 168 hours. No results for input(s): AMMONIA in the last 168 hours. Coagulation Profile: Recent Labs  Lab 01/24/21 1200 01/25/21 0344  INR 1.1 1.2   Cardiac Enzymes: No results for input(s): CKTOTAL, CKMB, CKMBINDEX, TROPONINI in the last 168 hours. BNP (last 3 results) No results for input(s): PROBNP in the last 8760 hours. HbA1C: No results for input(s): HGBA1C in the last 72 hours. CBG: No results for input(s): GLUCAP in the last 168 hours. Lipid Profile: No results for input(s): CHOL, HDL, LDLCALC, TRIG, CHOLHDL, LDLDIRECT in the last 72 hours. Thyroid Function Tests: No results for input(s): TSH, T4TOTAL, FREET4, T3FREE, THYROIDAB in the last 72 hours. Anemia Panel: No results for input(s): VITAMINB12, FOLATE, FERRITIN, TIBC,  IRON, RETICCTPCT in the last 72 hours. Sepsis Labs: Recent Labs  Lab 01/24/21 1200 01/24/21 1852 01/24/21 2144 01/25/21 0344  PROCALCITON  --   --   --  <0.10  LATICACIDVEN 1.1 2.1* 1.8  --     Recent Results (from the past 240 hour(s))  Blood Culture (routine x 2)     Status: None (Preliminary result)   Collection Time: 01/24/21 12:00 PM   Specimen: BLOOD  Result Value Ref Range Status   Specimen Description   Final    BLOOD LEFT ANTECUBITAL Performed at [facility], [place] [address], [place], [place] [address]    Special Requests   Final    BOTTLES DRAWN AEROBIC AND ANAEROBIC Blood Culture results may not be optimal due to an excessive volume of blood received in culture bottles Performed at [place] [address] [place], [place] [address]    Culture   Final    NO GROWTH < 24 HOURS Performed at [place] [address] [place], [place] [address]    Report Status PENDING  Incomplete  Blood Culture (routine x 2)     Status: None (Preliminary result)   Collection Time: 01/24/21 12:00 PM   Specimen: BLOOD  Result Value Ref Range Status   Specimen Description   Final    BLOOD RIGHT ANTECUBITAL Performed at [place] [address] [place], [place] [address]    Special Requests   Final    BOTTLES DRAWN AEROBIC AND ANAEROBIC Blood Culture results may not be optimal due to an excessive volume of blood received in culture bottles Performed at [place] [address], [place],  [address]    Culture   Final    NO GROWTH < 24 HOURS Performed at [place] [address], [place],  [address]    Report Status PENDING  Incomplete  Resp Panel by RT-PCR (Flu A&B, Covid) Nasopharyngeal Swab     Status: None   Collection Time: 01/24/21 12:22 PM   Specimen: Nasopharyngeal Swab; Nasopharyngeal(NP) swabs in vial transport medium  Result Value Ref Range Status   SARS Coronavirus 2 by RT PCR NEGATIVE NEGATIVE Final    Comment: (NOTE) SARS-CoV-2 target nucleic acids are NOT DETECTED.  The SARS-CoV-2 RNA is generally detectable in upper respiratory specimens during the acute phase of infection. The lowest concentration of SARS-CoV-2 viral copies this assay can detect is 138 copies/mL. A negative result does not preclude SARS-Cov-2 infection and should not be used as the sole basis for treatment or other patient management decisions. A negative  result may occur with  improper specimen collection/handling, submission of specimen other than nasopharyngeal swab, presence of viral mutation(s) within the areas targeted by this assay, and inadequate number of viral copies(<138 copies/mL). A negative result must be combined with clinical observations, patient history, and epidemiological information. The expected result is Negative.  Fact Sheet for Patients:  EntrepreneurPulse.com.au  Fact Sheet for Healthcare Providers:  IncredibleEmployment.be  This test is no t yet approved or cleared by the [place] FDA and  has been authorized for detection and/or diagnosis of SARS-CoV-2 by FDA under an Emergency Use Authorization (EUA). This EUA will remain  in effect (meaning this test can be used) for the duration of the COVID-19 declaration under Section 564(b)(1) of the Act, 21 U.S.C.section 360bbb-3(b)(1), unless the authorization is terminated  or revoked sooner.       Influenza A  by PCR NEGATIVE NEGATIVE Final   Influenza B by PCR NEGATIVE NEGATIVE Final    Comment: (NOTE) The Xpert Xpress SARS-CoV-2/FLU/RSV plus assay is intended as an aid in the diagnosis of influenza from Nasopharyngeal swab specimens and should not be used as a sole basis for treatment. Nasal washings and aspirates are unacceptable for Xpert Xpress SARS-CoV-2/FLU/RSV testing.  Fact Sheet for Patients: EntrepreneurPulse.com.au  Fact Sheet for Healthcare Providers: IncredibleEmployment.be  This test is not yet approved or cleared by the [place] FDA and has been authorized for detection and/or diagnosis of SARS-CoV-2 by FDA under an Emergency Use Authorization (EUA). This EUA will remain in effect (meaning this test can be used) for the duration of the COVID-19 declaration under Section 564(b)(1) of the Act, 21 U.S.C. section 360bbb-3(b)(1), unless the authorization is  terminated or revoked.  Performed at [facility], [place] [address], [place], [place] [address]          Radiology Studies: CT Angio Chest PE W and/or Wo Contrast  Result Date: 01/24/2021 CLINICAL DATA:  61 year old male with stage IV non-small cell lung cancer with diffuse chest and abdominal pain with cough and intermittent shortness of breath. Chemotherapy in progress. EXAM: CT ANGIOGRAPHY CHEST CT ABDOMEN AND PELVIS WITH CONTRAST TECHNIQUE: Multidetector CT imaging of the chest was performed using the standard protocol during bolus administration of intravenous contrast. Multiplanar CT image reconstructions and MIPs were obtained to evaluate the vascular anatomy. Multidetector CT imaging of the abdomen and pelvis was performed using the standard protocol during bolus administration of intravenous contrast. RADIATION DOSE REDUCTION: This exam was performed according to the departmental dose-optimization program which includes automated exposure control, adjustment of the mA and/or kV according to patient size and/or use of iterative reconstruction technique. CONTRAST:  129m OMNIPAQUE IOHEXOL 350 MG/ML SOLN COMPARISON:  01/22/2021 prior CTs FINDINGS: CTA CHEST FINDINGS Cardiovascular: This is a technically borderline study due to respiratory motion artifact and less than optimal contrast opacification of the pulmonary arteries. No definite pulmonary emboli are identified. UPPER limits normal heart size noted. There is no evidence of thoracic aortic aneurysm. No pericardial effusion. Mediastinum/Nodes: Enlarged mediastinal and LEFT hilar lymph nodes are unchanged with index nodes as follows: A 1.2 cm prevascular node (series 1: Image 25) A 1.2 cm LEFT hilar node (1:32). No new or enlarging lymph nodes are identified since 01/22/2021. The visualized thyroid gland, trachea and esophagus are unremarkable. Lungs/Pleura: Unchanged dominant LEFT UPPER lobe mass again measures 5.8 x 4.4 x  4.5 cm (8:44) and contains a fiducial marker. Numerous adjacent satellite lesions/nodules are again identified. Numerous diffuse bilateral pulmonary nodules are unchanged from the prior study with the an index 8 mm LEFT UPPER lobe nodule (8:60). Mild dependent/basilar atelectasis is noted. No new pulmonary opacities are identified. No pleural effusion or pneumothorax noted. Musculoskeletal: No acute or suspicious bony lesions are identified. Review of the MIP images confirms the above findings. CT ABDOMEN and PELVIS FINDINGS Hepatobiliary: The liver is unremarkable. Cholelithiasis noted with possible mild gallbladder wall thickening, but without definite pericholecystic inflammation. No biliary dilatation. Pancreas: Unremarkable Spleen: Unremarkable Adrenals/Urinary Tract: The kidneys, adrenal glands and bladder are unremarkable except for a punctate nonobstructing LEFT renal calculus. Stomach/Bowel: Circumferential wall thickening of the proximal sigmoid colon with mild adjacent stranding is unchanged. There is no evidence of bowel obstruction or new bowel wall thickening. There is no evidence of appendicitis. Vascular/Lymphatic: Aortic atherosclerosis. No enlarged abdominal or pelvic lymph nodes. Reproductive: Unremarkable Other: No ascites, focal collection  or pneumoperitoneum. Musculoskeletal: No acute findings noted. Review of the MIP images confirms the above findings. IMPRESSION: 1. No evidence of acute abnormality within the chest, abdomen or pelvis. No evidence of pulmonary emboli, but sensitivity is decreased due to technical factors as described above. 2. Cholelithiasis with possible mild gallbladder wall thickening, but without definite pericholecystic inflammation. Consider further evaluation with ultrasound if there is strong clinical suspicion for acute cholecystitis. 3. Unchanged dominant LEFT UPPER lobe malignancy, diffuse bilateral pulmonary metastatic disease/nodules and mediastinal and LEFT hilar  lymphadenopathy. 4. Unchanged circumferential wall thickening of the proximal sigmoid colon with mild adjacent stranding, which may represent malignancy or chronic inflammation/infection. 5. LEFT nephrolithiasis 6. Aortic Atherosclerosis (ICD10-I70.0). Electronically Signed   By: Margarette Canada M.D.   On: 01/24/2021 14:17   CT ABDOMEN PELVIS W CONTRAST  Result Date: 01/24/2021 CLINICAL DATA:  61 year old male with stage IV non-small cell lung cancer with diffuse chest and abdominal pain with cough and intermittent shortness of breath. Chemotherapy in progress. EXAM: CT ANGIOGRAPHY CHEST CT ABDOMEN AND PELVIS WITH CONTRAST TECHNIQUE: Multidetector CT imaging of the chest was performed using the standard protocol during bolus administration of intravenous contrast. Multiplanar CT image reconstructions and MIPs were obtained to evaluate the vascular anatomy. Multidetector CT imaging of the abdomen and pelvis was performed using the standard protocol during bolus administration of intravenous contrast. RADIATION DOSE REDUCTION: This exam was performed according to the departmental dose-optimization program which includes automated exposure control, adjustment of the mA and/or kV according to patient size and/or use of iterative reconstruction technique. CONTRAST:  170m OMNIPAQUE IOHEXOL 350 MG/ML SOLN COMPARISON:  01/22/2021 prior CTs FINDINGS: CTA CHEST FINDINGS Cardiovascular: This is a technically borderline study due to respiratory motion artifact and less than optimal contrast opacification of the pulmonary arteries. No definite pulmonary emboli are identified. UPPER limits normal heart size noted. There is no evidence of thoracic aortic aneurysm. No pericardial effusion. Mediastinum/Nodes: Enlarged mediastinal and LEFT hilar lymph nodes are unchanged with index nodes as follows: A 1.2 cm prevascular node (series 1: Image 25) A 1.2 cm LEFT hilar node (1:32). No new or enlarging lymph nodes are identified since  01/22/2021. The visualized thyroid gland, trachea and esophagus are unremarkable. Lungs/Pleura: Unchanged dominant LEFT UPPER lobe mass again measures 5.8 x 4.4 x 4.5 cm (8:44) and contains a fiducial marker. Numerous adjacent satellite lesions/nodules are again identified. Numerous diffuse bilateral pulmonary nodules are unchanged from the prior study with the an index 8 mm LEFT UPPER lobe nodule (8:60). Mild dependent/basilar atelectasis is noted. No new pulmonary opacities are identified. No pleural effusion or pneumothorax noted. Musculoskeletal: No acute or suspicious bony lesions are identified. Review of the MIP images confirms the above findings. CT ABDOMEN and PELVIS FINDINGS Hepatobiliary: The liver is unremarkable. Cholelithiasis noted with possible mild gallbladder wall thickening, but without definite pericholecystic inflammation. No biliary dilatation. Pancreas: Unremarkable Spleen: Unremarkable Adrenals/Urinary Tract: The kidneys, adrenal glands and bladder are unremarkable except for a punctate nonobstructing LEFT renal calculus. Stomach/Bowel: Circumferential wall thickening of the proximal sigmoid colon with mild adjacent stranding is unchanged. There is no evidence of bowel obstruction or new bowel wall thickening. There is no evidence of appendicitis. Vascular/Lymphatic: Aortic atherosclerosis. No enlarged abdominal or pelvic lymph nodes. Reproductive: Unremarkable Other: No ascites, focal collection or pneumoperitoneum. Musculoskeletal: No acute findings noted. Review of the MIP images confirms the above findings. IMPRESSION: 1. No evidence of acute abnormality within the chest, abdomen or pelvis. No evidence of pulmonary emboli, but sensitivity  is decreased due to technical factors as described above. 2. Cholelithiasis with possible mild gallbladder wall thickening, but without definite pericholecystic inflammation. Consider further evaluation with ultrasound if there is strong clinical  suspicion for acute cholecystitis. 3. Unchanged dominant LEFT UPPER lobe malignancy, diffuse bilateral pulmonary metastatic disease/nodules and mediastinal and LEFT hilar lymphadenopathy. 4. Unchanged circumferential wall thickening of the proximal sigmoid colon with mild adjacent stranding, which may represent malignancy or chronic inflammation/infection. 5. LEFT nephrolithiasis 6. Aortic Atherosclerosis (ICD10-I70.0). Electronically Signed   By: Margarette Canada M.D.   On: 01/24/2021 14:17   DG Chest Port 1 View  Result Date: 01/24/2021 CLINICAL DATA:  Sepsis EXAM: PORTABLE CHEST 1 VIEW COMPARISON:  Chest x-ray 09/14/2020, CT chest 01/22/2021 FINDINGS: Heart size and mediastinum are stable and within normal limits. No new consolidation or infiltrates identified in the lungs. 4.4 x 4.2 cm left upper lobe mass again visualized. No pleural effusion or pneumothorax. IMPRESSION: No acute process identified.  Left upper lobe mass. Electronically Signed   By: Ofilia Neas M.D.   On: 01/24/2021 12:41   US Abdomen Limited RUQ (LIVER/GB)  Result Date: 01/24/2021 CLINICAL DATA:  Right upper quadrant pain EXAM: ULTRASOUND ABDOMEN LIMITED RIGHT UPPER QUADRANT COMPARISON:  None. FINDINGS: Gallbladder: Several echogenic shadowing calculi. Evidence of wall adenomyomatosis. No significant wall thickening or surrounding fluid. No sonographic Percell Miller sign reported. Common bile duct: Diameter: 5 mm Liver: No focal lesion identified. Within normal limits in parenchymal echogenicity. Portal vein is patent on color Doppler imaging with normal direction of blood flow towards the liver. Other: None. IMPRESSION: Cholelithiasis and evidence of gallbladder adenomyomatosis. Electronically Signed   By: Ofilia Neas M.D.   On: 01/24/2021 16:09        Scheduled Meds:  enoxaparin (LOVENOX) injection  60 mg Subcutaneous C46F   folic acid  1 mg Oral Daily   guaiFENesin  600 mg Oral BID   potassium chloride  40 mEq Oral BID    [START ON 01/26/2021] sodium phosphate  1 enema Rectal Once   [START ON 01/26/2021] sodium phosphate  1 enema Rectal Once   Continuous Infusions:  ceFEPime (MAXIPIME) IV 2 g (01/25/21 0615)   metronidazole 500 mg (01/25/21 0849)     LOS: 1 day    Time spent: 43 minutes spent on chart review, discussion with nursing staff, consultants, updating family and interview/physical exam; more than 50% of that time was spent in counseling and/or coordination of care.    Samon Dishner J [place] DO [facility] Available via Epic secure chat 7am-7pm After these hours, please refer to coverage provider listed on [email or web address] 01/25/2021, 1:11 PM

## 2021-01-25 NOTE — Progress Notes (Signed)
Patient was alert and awake upon arrival. Spouse was bedside. Patient was sitting on the edge of bed and able to engage in meaningful conversation. Shared parts of health challenges with [place]. Patient has placed health in 'God's hands'. Was emotive during visit. Spiritual care was provided through validation of emotions and feelings, participation in life review and prayer. Spiritual care visit was appreciated.   Reverend S. Evelena Asa, M.Div. Healthcare Chaplain

## 2021-01-25 NOTE — TOC Initial Note (Signed)
Transition of Care [facility]) - Initial/Assessment Note    Patient Details  Name: Raymond [name] MRN: [id number] Date of Birth: September 04, 1960  Transition of Care [facility]) CM/SW Contact:    Leeroy Cha, RN Phone Number: 01/25/2021, 10:25 AM  Clinical Narrative:                  Transition of Care [facility]) Screening Note   Patient Details  Name: Raymond [name] Date of Birth: 05-11-1960   Transition of Care [facility]) CM/SW Contact:    Leeroy Cha, RN Phone Number: 01/25/2021, 10:25 AM    Transition of Care Department (TOC) has reviewed patient and no TOC needs have been identified at this time. We will continue to monitor patient advancement through interdisciplinary progression rounds. If new patient transition needs arise, please place a TOC consult.    Expected Discharge Plan: Home/Self Care Barriers to Discharge: Continued Medical Work up   Patient Goals and CMS Choice Patient states their goals for this hospitalization and ongoing recovery are:: to go home CMS Medicare.gov Compare Post Acute Care list provided to:: Patient    Expected Discharge Plan and Services Expected Discharge Plan: Home/Self Care   Discharge Planning Services: CM Consult   Living arrangements for the past 2 months: Apartment                                      Prior Living Arrangements/Services Living arrangements for the past 2 months: Apartment Lives with:: Spouse Patient language and need for interpreter reviewed:: Yes              Criminal Activity/Legal Involvement Pertinent to Current Situation/Hospitalization: No - Comment as needed  Activities of Daily Living      Permission Sought/Granted                  Emotional Assessment Appearance:: Appears stated age     Orientation: : Oriented to Self, Oriented to Place, Oriented to  Time, Oriented to Situation Alcohol / Substance Use: Not Applicable Psych Involvement: No (comment)  Admission diagnosis:  RUQ  pain [R10.11] Sepsis ([place]) [A41.9] Sepsis without acute organ dysfunction, due to unspecified organism [facility]) [A41.9] Patient Active Problem List   Diagnosis Date Noted   Sepsis ([place]) 01/24/2021   Hypertension 11/02/2020   Adenocarcinoma of left lung, stage 4 ([place]) 10/05/2020   Malignant neoplasm of upper lobe of left lung ([place]) 09/24/2020   Encounter for antineoplastic chemotherapy 09/24/2020   Mass of left lung 09/14/2020   Acute diverticulitis 09/01/2020   Hx of diverticulitis of colon 05/21/2019   Paraplegia, incomplete ([place]) 05/20/2019   Baker cyst 02/28/2015   Chest pain, exertional 02/28/2015   Baker's cyst 02/28/2015   Abnormal EKG 02/28/2015   MICROSCOPIC HEMATURIA 10/17/2007   ABDOMINAL PAIN 10/17/2007   HEMATURIA, HX OF 10/17/2007   PSYCHOLOGICAL STRESS 05/31/2007   Obesity, unspecified 04/19/2007   TOBACCO ABUSE 04/19/2007   RHINITIS, ALLERGIC NOS 04/19/2006   HYPERTENSION, BENIGN 03/09/2006   FATIGUE 03/09/2006   WEIGHT GAIN 03/09/2006   HEADACHE 03/09/2006   ANXIETY 03/02/2006   CHEST PAIN 03/02/2006   PCP:  Merryl Hacker, No Pharmacy:   [id number] [address], [place] AT [address] [place] [address] Phone: [phone] Fax: [phone]  [address] [place], [place]  [place] [address] Phone: [phone] Fax: [place] [address] [place] [address] Phone: [phone] Fax: [place] [address] Phone: [phone] Fax: [phone]     Social Determinants of Health (SDOH) Interventions    Readmission Risk Interventions No flowsheet data found.

## 2021-01-25 NOTE — Consult Note (Addendum)
Referring Provider: [facility] Primary Care Physician:  Pcp, No Primary Gastroenterologist:  Althia Forts  Reason for Consultation:  LLQ abdominal pain  HPI: Raymond [name] is a 61 y.o. 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 Reports history of diverticulitis (most recent being one year ago). He states he thought his LLQ pain was a diverticulitis flare up. Reports "grumbling" in the left lower abdomen. States he took stool softeners and benefiber to try to help. Had a loose stool 1/21. He then woke up and was getting ready for church 1/22 when his abdominal pain began to become more persistent and he developed chest pain and shortness of breath. This prompted him to come to the hospital. Denies melena/hematochezia. Denies weight loss. Denies nausea/vomiting. Denies fever/chills. Denies family history of colon cancer. Denies alcohol/tobacco use. States he has been taking 1 BC powder daily for "many many years" for his arthritis.  Reports having had a colonoscopy 15 years ago, states it was done across the street from [facility] but unable to recall name of group. No report seen in Epic. Patient also does not recall results of colonoscopy.  Past Medical History:  Diagnosis Date   Arthritis    Baker's cyst    right   Diverticulitis    GERD (gastroesophageal reflux disease)    Heart attack ([place]) 01/04/2004   History of hiatal hernia    Hypertension    Malignant neoplasm of upper lobe of left lung ([place]) 09/24/2020   Pneumonia    as a child   Sleep apnea    no longer on Cpap    Past Surgical History:  Procedure Laterality Date   BRONCHIAL BIOPSY  09/14/2020   Procedure: BRONCHIAL BIOPSIES;  Surgeon: Collene Gobble, MD;  Location: [place];  Service: Cardiopulmonary;;   BRONCHIAL BRUSHINGS  09/14/2020   Procedure: BRONCHIAL BRUSHINGS;  Surgeon: Collene Gobble, MD;   Location: [place];  Service: Cardiopulmonary;;   BRONCHIAL NEEDLE ASPIRATION BIOPSY  09/14/2020   Procedure: BRONCHIAL NEEDLE ASPIRATION BIOPSIES;  Surgeon: Collene Gobble, MD;  Location: [place];  Service: Cardiopulmonary;;   CARDIAC CATHETERIZATION     CARPAL TUNNEL RELEASE     right   FIDUCIAL MARKER PLACEMENT  09/14/2020   Procedure: FIDUCIAL MARKER PLACEMENT;  Surgeon: Collene Gobble, MD;  Location: [place];  Service: Cardiopulmonary;;   HAND SURGERY     left   KNEE SURGERY     left   VIDEO BRONCHOSCOPY N/A 09/14/2020   Procedure: ROBOTIC VIDEO BRONCHOSCOPY WITH FLUORO;  Surgeon: Collene Gobble, MD;  Location: [place];  Service: Cardiopulmonary;  Laterality: N/A;   VIDEO BRONCHOSCOPY WITH ENDOBRONCHIAL ULTRASOUND N/A 09/14/2020   Procedure: VIDEO BRONCHOSCOPY WITH ENDOBRONCHIAL ULTRASOUND;  Surgeon: Collene Gobble, MD;  Location: [place];  Service: Cardiopulmonary;  Laterality: N/A;   VIDEO BRONCHOSCOPY WITH RADIAL ENDOBRONCHIAL ULTRASOUND  09/14/2020   Procedure: VIDEO BRONCHOSCOPY WITH RADIAL ENDOBRONCHIAL ULTRASOUND;  Surgeon: Collene Gobble, MD;  [facility];  Service: Cardiopulmonary;;    Prior to Admission medications   Medication Sig Start Date End Date Taking? Authorizing Provider  acetaminophen (TYLENOL) 650 MG CR tablet Take 650 mg by mouth every 8 (eight) hours as needed for pain.   Yes [provider]  folic acid (FOLVITE) 1 MG tablet Take 1 tablet (1 mg total) by mouth daily. 11/23/20  Yes  Curt Bears, MD  Misc Natural Products (OSTEO BI-FLEX ADV JOINT SHIELD) TABS Take 1 tablet by mouth daily.   Yes [provider]  prochlorperazine (COMPAZINE) 10 MG tablet Take 1 tablet (10 mg total) by mouth every 6 (six) hours as needed for nausea or vomiting. 09/28/20  Yes Curt Bears, MD  simethicone (MYLICON) 973 MG chewable tablet Chew 125 mg by mouth every 6 (six) hours as needed for flatulence.   Yes [provider]  Wheat Dextrin (BENEFIBER DRINK MIX) PACK Take 1 Package by mouth daily at 6 (six) AM.   Yes [provider]  mirtazapine (REMERON) 15 MG tablet Take 1 tablet by mouth at bedtime. Patient not taking: Reported on 01/24/2021 12/14/20   [name], [name], PA-C    Scheduled Meds:  enoxaparin (LOVENOX) injection  60 mg Subcutaneous Z32D   folic acid  1 mg Oral Daily   guaiFENesin  600 mg Oral BID   potassium chloride  40 mEq Oral BID   Continuous Infusions:  ceFEPime (MAXIPIME) IV 2 g (01/25/21 0615)   metronidazole 500 mg (01/25/21 0849)   vancomycin     PRN Meds:.acetaminophen **OR** acetaminophen, albuterol, hydrALAZINE, morphine injection, oxyCODONE, prochlorperazine  Allergies as of 01/24/2021 - Review Complete 01/24/2021  Allergen Reaction Noted   Benadryl [diphenhydramine] Itching 10/12/2020    Family History  Problem Relation Age of Onset   Lung cancer Mother    Heart failure Sister     Social History   Socioeconomic History   Marital status: Married    Spouse name: Not on file   Number of children: Not on file   Years of education: Not on file   Highest education level: Not on file  Occupational History   Not on file  Tobacco Use   Smoking status: Every Day    Types: Cigarettes   Smokeless tobacco: Never  Vaping Use   Vaping Use: Never used  Substance and Sexual Activity   Alcohol use: No   Drug use: No   Sexual activity: Not on file  Other Topics Concern   Not on file  Social History Narrative   Not on file   Social Determinants of Health   Financial Resource Strain: High Risk   Difficulty of Paying Living Expenses: Hard  Food Insecurity: No Food Insecurity   Worried About Running Out of Food in the Last Year: Never true   Ran Out of Food in the Last Year: Never true  Transportation Needs: No Transportation Needs   Lack of Transportation (Medical): No   Lack of Transportation (Non-Medical): No  Physical Activity:  Not on file  Stress: Stress Concern Present   Feeling of Stress : Very much  Social Connections: Socially Integrated   Frequency of Communication with Friends and Family: More than three times a week   Frequency of Social Gatherings with Friends and Family: More than three times a week   Attends Religious Services: More than 4 times per year   Active Member of [employer] or Organizations: Yes   Attends Music therapist: More than 4 times per year   Marital Status: Married  Human resources officer Violence: Not on file    Review of Systems: Review of Systems  Constitutional:  Negative for chills and fever.  HENT:  Negative for hearing loss and tinnitus.   Eyes:  Negative for blurred vision.  Respiratory:  Positive for shortness of breath. Negative for hemoptysis.   Cardiovascular:  Positive for chest pain. Negative for  palpitations.  Gastrointestinal:  Positive for abdominal pain. Negative for blood in stool, constipation, diarrhea, heartburn, melena, nausea and vomiting.  Genitourinary:  Negative for dysuria and urgency.  Musculoskeletal:  Negative for myalgias and neck pain.  Skin:  Negative for itching and rash.  Neurological:  Negative for seizures and loss of consciousness.  Psychiatric/Behavioral:  Negative for substance abuse. The patient is not nervous/anxious.     Physical Exam:Physical Exam Constitutional:      Appearance: He is obese.  HENT:     Head: Normocephalic and atraumatic.     Nose: Nose normal. No congestion.     Mouth/Throat:     Mouth: Mucous membranes are moist.     Pharynx: Oropharynx is clear.  Eyes:     Extraocular Movements: Extraocular movements intact.     Conjunctiva/sclera: Conjunctivae normal.  Cardiovascular:     Rate and Rhythm: Normal rate and regular rhythm.  Pulmonary:     Effort: Pulmonary effort is normal. No respiratory distress.  Abdominal:     General: Bowel sounds are normal. There is no distension.     Palpations: Abdomen is  soft. There is no mass.     Tenderness: There is abdominal tenderness (LLQ). There is no guarding or rebound.     Hernia: No hernia is present.  Musculoskeletal:        General: No swelling. Normal range of motion.     Cervical back: Normal range of motion and neck supple.  Skin:    General: Skin is warm and dry.  Neurological:     General: No focal deficit present.     Mental Status: He is alert and oriented to person, place, and time.  Psychiatric:        Mood and Affect: Mood normal.        Behavior: Behavior normal.        Thought Content: Thought content normal.        Judgment: Judgment normal.    Vital signs: Vitals:   01/25/21 0224 01/25/21 0540  BP: (!) 157/80 (!) 143/74  Pulse: 99 82  Resp: 17 20  Temp: 100.2 F (37.9 C) 98.2 F (36.8 C)  SpO2: 92% 95%   Last BM Date: 01/23/21    GI:  Lab Results: Recent Labs    01/24/21 1200 01/25/21 0344  WBC 20.0* 20.3*  HGB 10.9* 9.3*  HCT 34.0* 29.3*  PLT 303 256   BMET Recent Labs    01/24/21 1200 01/25/21 0344  NA 139 136  K 3.3* 3.9  CL 106 106  CO2 24 25  GLUCOSE 131* 104*  BUN 13 10  CREATININE 1.00 0.83  CALCIUM 8.8* 8.4*   LFT Recent Labs    01/25/21 0344  PROT 6.4*  ALBUMIN 2.9*  AST 16  ALT 12  ALKPHOS 66  BILITOT 0.8   PT/INR Recent Labs    01/24/21 1200 01/25/21 0344  LABPROT 13.8 15.2  INR 1.1 1.2     Studies/Results: CT Angio Chest PE W and/or Wo Contrast  Result Date: 01/24/2021 CLINICAL DATA:  61 year old male with stage IV non-small cell lung cancer with diffuse chest and abdominal pain with cough and intermittent shortness of breath. Chemotherapy in progress. EXAM: CT ANGIOGRAPHY CHEST CT ABDOMEN AND PELVIS WITH CONTRAST TECHNIQUE: Multidetector CT imaging of the chest was performed using the standard protocol during bolus administration of intravenous contrast. Multiplanar CT image reconstructions and MIPs were obtained to evaluate the vascular anatomy. Multidetector CT  imaging of the  abdomen and pelvis was performed using the standard protocol during bolus administration of intravenous contrast. RADIATION DOSE REDUCTION: This exam was performed according to the departmental dose-optimization program which includes automated exposure control, adjustment of the mA and/or kV according to patient size and/or use of iterative reconstruction technique. CONTRAST:  125mL OMNIPAQUE IOHEXOL 350 MG/ML SOLN COMPARISON:  01/22/2021 prior CTs FINDINGS: CTA CHEST FINDINGS Cardiovascular: This is a technically borderline study due to respiratory motion artifact and less than optimal contrast opacification of the pulmonary arteries. No definite pulmonary emboli are identified. UPPER limits normal heart size noted. There is no evidence of thoracic aortic aneurysm. No pericardial effusion. Mediastinum/Nodes: Enlarged mediastinal and LEFT hilar lymph nodes are unchanged with index nodes as follows: A 1.2 cm prevascular node (series 1: Image 25) A 1.2 cm LEFT hilar node (1:32). No new or enlarging lymph nodes are identified since 01/22/2021. The visualized thyroid gland, trachea and esophagus are unremarkable. Lungs/Pleura: Unchanged dominant LEFT UPPER lobe mass again measures 5.8 x 4.4 x 4.5 cm (8:44) and contains a fiducial marker. Numerous adjacent satellite lesions/nodules are again identified. Numerous diffuse bilateral pulmonary nodules are unchanged from the prior study with the an index 8 mm LEFT UPPER lobe nodule (8:60). Mild dependent/basilar atelectasis is noted. No new pulmonary opacities are identified. No pleural effusion or pneumothorax noted. Musculoskeletal: No acute or suspicious bony lesions are identified. Review of the MIP images confirms the above findings. CT ABDOMEN and PELVIS FINDINGS Hepatobiliary: The liver is unremarkable. Cholelithiasis noted with possible mild gallbladder wall thickening, but without definite pericholecystic inflammation. No biliary dilatation. Pancreas:  Unremarkable Spleen: Unremarkable Adrenals/Urinary Tract: The kidneys, adrenal glands and bladder are unremarkable except for a punctate nonobstructing LEFT renal calculus. Stomach/Bowel: Circumferential wall thickening of the proximal sigmoid colon with mild adjacent stranding is unchanged. There is no evidence of bowel obstruction or new bowel wall thickening. There is no evidence of appendicitis. Vascular/Lymphatic: Aortic atherosclerosis. No enlarged abdominal or pelvic lymph nodes. Reproductive: Unremarkable Other: No ascites, focal collection or pneumoperitoneum. Musculoskeletal: No acute findings noted. Review of the MIP images confirms the above findings. IMPRESSION: 1. No evidence of acute abnormality within the chest, abdomen or pelvis. No evidence of pulmonary emboli, but sensitivity is decreased due to technical factors as described above. 2. Cholelithiasis with possible mild gallbladder wall thickening, but without definite pericholecystic inflammation. Consider further evaluation with ultrasound if there is strong clinical suspicion for acute cholecystitis. 3. Unchanged dominant LEFT UPPER lobe malignancy, diffuse bilateral pulmonary metastatic disease/nodules and mediastinal and LEFT hilar lymphadenopathy. 4. Unchanged circumferential wall thickening of the proximal sigmoid colon with mild adjacent stranding, which may represent malignancy or chronic inflammation/infection. 5. LEFT nephrolithiasis 6. Aortic Atherosclerosis (ICD10-I70.0). Electronically Signed   By: Margarette Canada M.D.   On: 01/24/2021 14:17   CT ABDOMEN PELVIS W CONTRAST  Result Date: 01/24/2021 CLINICAL DATA:  61 year old male with stage IV non-small cell lung cancer with diffuse chest and abdominal pain with cough and intermittent shortness of breath. Chemotherapy in progress. EXAM: CT ANGIOGRAPHY CHEST CT ABDOMEN AND PELVIS WITH CONTRAST TECHNIQUE: Multidetector CT imaging of the chest was performed using the standard protocol  during bolus administration of intravenous contrast. Multiplanar CT image reconstructions and MIPs were obtained to evaluate the vascular anatomy. Multidetector CT imaging of the abdomen and pelvis was performed using the standard protocol during bolus administration of intravenous contrast. RADIATION DOSE REDUCTION: This exam was performed according to the departmental dose-optimization program which includes automated exposure control, adjustment of the  mA and/or kV according to patient size and/or use of iterative reconstruction technique. CONTRAST:  136mL OMNIPAQUE IOHEXOL 350 MG/ML SOLN COMPARISON:  01/22/2021 prior CTs FINDINGS: CTA CHEST FINDINGS Cardiovascular: This is a technically borderline study due to respiratory motion artifact and less than optimal contrast opacification of the pulmonary arteries. No definite pulmonary emboli are identified. UPPER limits normal heart size noted. There is no evidence of thoracic aortic aneurysm. No pericardial effusion. Mediastinum/Nodes: Enlarged mediastinal and LEFT hilar lymph nodes are unchanged with index nodes as follows: A 1.2 cm prevascular node (series 1: Image 25) A 1.2 cm LEFT hilar node (1:32). No new or enlarging lymph nodes are identified since 01/22/2021. The visualized thyroid gland, trachea and esophagus are unremarkable. Lungs/Pleura: Unchanged dominant LEFT UPPER lobe mass again measures 5.8 x 4.4 x 4.5 cm (8:44) and contains a fiducial marker. Numerous adjacent satellite lesions/nodules are again identified. Numerous diffuse bilateral pulmonary nodules are unchanged from the prior study with the an index 8 mm LEFT UPPER lobe nodule (8:60). Mild dependent/basilar atelectasis is noted. No new pulmonary opacities are identified. No pleural effusion or pneumothorax noted. Musculoskeletal: No acute or suspicious bony lesions are identified. Review of the MIP images confirms the above findings. CT ABDOMEN and PELVIS FINDINGS Hepatobiliary: The liver is  unremarkable. Cholelithiasis noted with possible mild gallbladder wall thickening, but without definite pericholecystic inflammation. No biliary dilatation. Pancreas: Unremarkable Spleen: Unremarkable Adrenals/Urinary Tract: The kidneys, adrenal glands and bladder are unremarkable except for a punctate nonobstructing LEFT renal calculus. Stomach/Bowel: Circumferential wall thickening of the proximal sigmoid colon with mild adjacent stranding is unchanged. There is no evidence of bowel obstruction or new bowel wall thickening. There is no evidence of appendicitis. Vascular/Lymphatic: Aortic atherosclerosis. No enlarged abdominal or pelvic lymph nodes. Reproductive: Unremarkable Other: No ascites, focal collection or pneumoperitoneum. Musculoskeletal: No acute findings noted. Review of the MIP images confirms the above findings. IMPRESSION: 1. No evidence of acute abnormality within the chest, abdomen or pelvis. No evidence of pulmonary emboli, but sensitivity is decreased due to technical factors as described above. 2. Cholelithiasis with possible mild gallbladder wall thickening, but without definite pericholecystic inflammation. Consider further evaluation with ultrasound if there is strong clinical suspicion for acute cholecystitis. 3. Unchanged dominant LEFT UPPER lobe malignancy, diffuse bilateral pulmonary metastatic disease/nodules and mediastinal and LEFT hilar lymphadenopathy. 4. Unchanged circumferential wall thickening of the proximal sigmoid colon with mild adjacent stranding, which may represent malignancy or chronic inflammation/infection. 5. LEFT nephrolithiasis 6. Aortic Atherosclerosis (ICD10-I70.0). Electronically Signed   By: Margarette Canada M.D.   On: 01/24/2021 14:17   DG Chest Port 1 View  Result Date: 01/24/2021 CLINICAL DATA:  Sepsis EXAM: PORTABLE CHEST 1 VIEW COMPARISON:  Chest x-ray 09/14/2020, CT chest 01/22/2021 FINDINGS: Heart size and mediastinum are stable and within normal limits. No  new consolidation or infiltrates identified in the lungs. 4.4 x 4.2 cm left upper lobe mass again visualized. No pleural effusion or pneumothorax. IMPRESSION: No acute process identified.  Left upper lobe mass. Electronically Signed   By: Ofilia Neas M.D.   On: 01/24/2021 12:41   US Abdomen Limited RUQ (LIVER/GB)  Result Date: 01/24/2021 CLINICAL DATA:  Right upper quadrant pain EXAM: ULTRASOUND ABDOMEN LIMITED RIGHT UPPER QUADRANT COMPARISON:  None. FINDINGS: Gallbladder: Several echogenic shadowing calculi. Evidence of wall adenomyomatosis. No significant wall thickening or surrounding fluid. No sonographic Percell Miller sign reported. Common bile duct: Diameter: 5 mm Liver: No focal lesion identified. Within normal limits in parenchymal echogenicity. Portal vein is patent  on color Doppler imaging with normal direction of blood flow towards the liver. Other: None. IMPRESSION: Cholelithiasis and evidence of gallbladder adenomyomatosis. Electronically Signed   By: Ofilia Neas M.D.   On: 01/24/2021 16:09    Impression: LLQ pain - CT ab/pelvis with contrast 1/22: cholelithiasis with mild wall thickening (suspicious for cholecystitis). Unchanged LU lobe malignancy. Circumferential wall thickening of proximal sigmoid colon with mild adjacent stranding (malignancy vs chronic inflammation). - US abdomen 1/22: cholelithiasis and evidence of gallbladder adenomyomatosis - Normal LFTs - Leykocytosis with WBC 20.3 - Hgb 9.3 (10.9 yesterday).  Plan: Plan for flexible sigmoidoscopy tomorrow. I thoroughly discussed the procedures to include nature, alternatives, benefits, and risks including but not limited to bleeding, perforation, infection, anesthesia/cardiac and pulmonary complications. Patient provides understanding and gave verbal consent to proceed.   Nulytely prep  Continue supportive care management.   [facility] will follow.       LOS: 1 day   Garnette Scheuermann  PA-C 01/25/2021, 9:16  AM  Contact #  [phone]

## 2021-01-25 NOTE — Progress Notes (Signed)
I stopped by to see the patient today as he had a previously scheduled appointment with me tomorrow.  In summary this is a 61 year old gentleman with a history of Stage IV, cT2bN2M1c, NSCLC, adenocarcinoma of the left lower lobe.  He has been receiving ongoing systemic carboplatin Alimta and Keytruda.  His first dose was administered in August October 2022.  He has received 5 cycles of this.  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  He went for his first post treatment Surveillance MRI in the outpatient setting on 01/21/2021.  Unfortunately a significant increase in the number of enhancing metastatic lesions in the cerebral and cerebellar hemispheres were noted.  Slightly increased edema in the right parietal lobe was noted and a new right mastoid effusion was noted.  He has not been having any symptoms of headache or visual or hearing changes or deficits in movement or function.  He states that he did have a few times where he felt a sensory change when touching his forehead that amplified over the posterior aspect of his scalp.  He is currently hospitalized however for sepsis as a result of what is felt to be diverticular disease.  He has been on broad-spectrum antibiotics, repeat imaging of the lung with CT angiography yesterday showed no evidence of embolism, and unchanged left upper lobe disease and known disease and other nodules throughout the lung and mediastinum and hilum.  He did have thickening of the proximal sigmoid colon with adjacent stranding Coley lithiasis with mild gallbladder wall thickening and left kidney stone.  I discussed the findings with him and his wife at the bedside today about the MRI scan and the recommendations for brain oncology conference which are to proceed with whole brain radiotherapy.  The patient is in agreement with this plan.  We will reach out to Dr. Julien Nordmann given the likely delay in his upcoming infusion of chemo and immunotherapy  which was scheduled for today.  We discussed 10 fractions of radiotherapy outlining the risks benefits, short and long-term effects of therapy and also specifically discussing the concerns about cognitive deficits from treatment.  We reviewed the rationale to use Namenda with the goal of preserving cognitive function.  He would start this the first day of radiotherapy.  He is also in agreement to move forward with simulation while he is an inpatient.  He is going for sigmoidoscopy tomorrow and has to prep for this.  We will likely offer simulation after that procedure or the following day if we are to move forward with radiotherapy prior to discharge.  The patient is in agreement with this plan. Written consent is obtained and placed in the chart, a copy was provided to the patient.      Carola Rhine, PAC

## 2021-01-25 NOTE — Plan of Care (Signed)

## 2021-01-25 NOTE — Telephone Encounter (Signed)
Called patient- per Shona Simpson PA-C, and verified identity. I called to inquire if patient would like to move up his telephone appointment from 07/26/21-8:30am to 07/25/21-2:00pm. Patient states "He was admitted to [facility] for diverticulitis and chest pain. He is extremely fatigued and would like to keep his telephone appointment the same (07/26/21-8:30am) so that he may rest today." I told patient that he should focus on resting/healing and that we would leave his telephone appointment on its original date/time above.

## 2021-01-25 NOTE — Evaluation (Signed)
Physical Therapy Evaluation Patient Details Name: Raymond [name] MRN: [id number] DOB: [birth year] Today's Date: 01/25/2021  History of Present Illness   Patient is 61 y.o. male admitted for chest pain and LLQ pain. PMH significant for NSCLC w/ brain mets on chemo, HTN, GERD. In ED CT showed possible sigmoid colitis vs malignancy. He was started on broad spec abx.    Clinical Impression  Raymond [name] is 61 y.o. male admitted with above HPI and diagnosis. Patient is currently limited by functional impairments below (see PT problem list). Patient lives with his family and is independent at baseline. Currently pt requires min guard/assist for transfers and gait. Pt unsteady and c/o Lt hip pain with gait, balance improved with use of IV pole fore support. Patient will benefit from continued skilled PT interventions to address impairments and progress independence with mobility, anticipate no PT needs when pt is medically ready for discharge. Acute PT will follow and progress as able.        Recommendations for follow up therapy are one component of a multi-disciplinary discharge planning process, led by the attending physician.  Recommendations may be updated based on patient status, additional functional criteria and insurance authorization.  PT Recommendation   Follow Up Recommendations No PT follow up Filed 01/25/2021 1000  Assistance recommended at discharge PRN Filed 01/25/2021 1000  Functional Status Assessment Patient has had a recent decline in their functional status and demonstrates the ability to make significant improvements in function in a reasonable and predictable amount of time. Filed 01/25/2021 1000    Precautions / Restrictions   Fall     Mobility  Bed Mobility Overal bed mobility: Modified Independent             General bed mobility comments: extra time    Transfers Overall transfer level: Needs assistance Equipment used: None Transfers: Sit to/from  Stand Sit to Stand: Min guard           General transfer comment: pt took 3 attempts/rocking forward to stand up with momentum. pt required wide BOS to power up and remain steady in standing.    Ambulation/Gait Ambulation/Gait assistance: Min assist Gait Distance (Feet): 200 Feet Assistive device: IV Pole Gait Pattern/deviations: Step-through pattern, Wide base of support Gait velocity: fair     General Gait Details: pt with wide BOS and drift slightly at start of gait. quality improved with distance but pt reliant on IV pole for stability.  Stairs            Wheelchair Mobility    Modified Rankin (Stroke Patients Only)       Balance Overall balance assessment: Mild deficits observed, not formally tested                                           Pertinent Vitals/Pain Pain Assessment Pain Assessment: 0-10 Pain Score: 8  Pain Location: Lt LQ and chest from coughing Pain Descriptors / Indicators: Aching, Discomfort Pain Intervention(s): Limited activity within patient's tolerance, Monitored during session, Repositioned     01/25/21 1000  Home Living  Family/patient expects to be discharged to: Private residence  Living Arrangements Spouse/significant other  Available Help at Discharge Family  Type of [place] to enter  Entrance Stairs-Number of Steps [place] One level  Bathroom Shower/Tub Tub/shower unit  [employer]  Standard  Bathroom Accessibility Yes  Home Equipment None  Additional Comments pt has assist from his wife, he works full time for [employer]  Prior Function  Prior Level of Function  Independent/Modified Independent  Mobility Comments pt walks about 4,000 steps/day at work and typically walks 1-2 blocks at home for exercise.    Cognition Arousal/Alertness: Awake/alert Behavior During Therapy: WFL for tasks assessed/performed Overall Cognitive Status: Within  Functional Limits for tasks assessed                                                  01/25/21 1000  PT - End of Session  Patient left in chair;with call bell/phone within reach  Nurse Communication Mobility status  PT Assessment  PT Recommendation/Assessment Patient needs continued PT services  PT Visit Diagnosis Unsteadiness on feet (R26.81);Difficulty in walking, not elsewhere classified (R26.2)  PT Problem List Decreased activity tolerance;Decreased balance;Decreased mobility;Decreased knowledge of use of DME;Obesity;Pain  PT Plan  PT Frequency (ACUTE ONLY) Min 3X/week  PT Treatment/Interventions (ACUTE ONLY) DME instruction;Gait training;Stair training;Functional mobility training;Therapeutic activities;Therapeutic exercise;Balance training;Neuromuscular re-education;Patient/family education  AM-PAC PT "6 Clicks" Mobility Outcome Measure (Version 2)  Help needed turning from your back to your side while in a flat bed without using bedrails? 4  Help needed moving from lying on your back to sitting on the side of a flat bed without using bedrails? 3  Help needed moving to and from a bed to a chair (including a wheelchair)? 3  Help needed standing up from a chair using your arms (e.g., wheelchair or bedside chair)? 3  Help needed to walk in hospital room? 3  Help needed climbing 3-5 steps with a railing?  3  6 Click Score 19  Consider Recommendation of Discharge To: Home with [facility]  Progressive Mobility  What is the highest level of mobility based on the progressive mobility assessment? Level 5 (Walks with assist in room/hall) - Balance while stepping forward/back and can walk in room with assist - Complete  Activity Ambulated with assistance in hallway  PT Recommendation  Follow Up Recommendations No PT follow up  Assistance recommended at discharge PRN  Functional Status Assessment Patient has had a recent decline in their functional status and demonstrates the  ability to make significant improvements in function in a reasonable and predictable amount of time.  PT equipment None recommended by PT  Individuals Consulted  Consulted and Agree with Results and Recommendations Patient  Acute Rehab PT Goals  Patient Stated Goal return home and stop hurting  PT Goal Formulation With patient  Time For Goal Achievement 02/08/21  Potential to Achieve Goals Good  PT Time Calculation  PT Start Time (ACUTE ONLY) 1016  PT Stop Time (ACUTE ONLY) 1042  PT Time Calculation (min) (ACUTE ONLY) 26 min  PT General Charges  $$ ACUTE PT VISIT 1 Visit  PT Evaluation  $PT Eval Low Complexity 1 Low  PT Treatments  $Gait Training 8-22 mins    Verner Mould, DPT Acute Rehabilitation Services Office [phone] Pager [phone]    Jacques Navy 01/25/2021, 2:38 PM

## 2021-01-26 ENCOUNTER — Ambulatory Visit
Admission: RE | Admit: 2021-01-26 | Discharge: 2021-01-26 | Disposition: A | Discharge status: Home / Self Care | Payer: 59 | Source: Ambulatory Visit | Attending: Radiation Oncology | Admitting: Radiation Oncology

## 2021-01-26 ENCOUNTER — Inpatient Hospital Stay (HOSPITAL_COMMUNITY): Payer: 59 | Admitting: Certified Registered Nurse Anesthetist

## 2021-01-26 ENCOUNTER — Encounter (HOSPITAL_COMMUNITY): Payer: Self-pay | Admitting: Internal Medicine

## 2021-01-26 ENCOUNTER — Encounter (HOSPITAL_COMMUNITY)
Admission: EM | Disposition: A | Discharge status: Home / Self Care | Payer: Self-pay | Source: Home / Self Care | Attending: Internal Medicine

## 2021-01-26 ENCOUNTER — Other Ambulatory Visit (HOSPITAL_COMMUNITY): Payer: Self-pay

## 2021-01-26 ENCOUNTER — Encounter: Payer: Self-pay | Admitting: Internal Medicine

## 2021-01-26 ENCOUNTER — Other Ambulatory Visit: Payer: Self-pay | Admitting: Radiation Oncology

## 2021-01-26 DIAGNOSIS — Z7189 Other specified counseling: Secondary | ICD-10-CM

## 2021-01-26 DIAGNOSIS — K529 Noninfective gastroenteritis and colitis, unspecified: Secondary | ICD-10-CM

## 2021-01-26 DIAGNOSIS — I10 Essential (primary) hypertension: Secondary | ICD-10-CM

## 2021-01-26 DIAGNOSIS — Z515 Encounter for palliative care: Secondary | ICD-10-CM

## 2021-01-26 DIAGNOSIS — M79605 Pain in left leg: Secondary | ICD-10-CM

## 2021-01-26 DIAGNOSIS — C3412 Malignant neoplasm of upper lobe, left bronchus or lung: Secondary | ICD-10-CM

## 2021-01-26 DIAGNOSIS — M79604 Pain in right leg: Secondary | ICD-10-CM

## 2021-01-26 DIAGNOSIS — E6609 Other obesity due to excess calories: Secondary | ICD-10-CM

## 2021-01-26 DIAGNOSIS — C3492 Malignant neoplasm of unspecified part of left bronchus or lung: Secondary | ICD-10-CM

## 2021-01-26 DIAGNOSIS — E876 Hypokalemia: Secondary | ICD-10-CM

## 2021-01-26 HISTORY — PX: BIOPSY: SHX5522

## 2021-01-26 HISTORY — PX: COLONOSCOPY WITH PROPOFOL: SHX5780

## 2021-01-26 LAB — CBC
HCT: 32.5 % — ABNORMAL LOW (ref 39.0–52.0)
Hemoglobin: 10.4 g/dL — ABNORMAL LOW (ref 13.0–17.0)
MCH: 31.4 pg (ref 26.0–34.0)
MCHC: 32 g/dL (ref 30.0–36.0)
MCV: 98.2 fL (ref 80.0–100.0)
Platelets: 269 10*3/uL (ref 150–400)
RBC: 3.31 MIL/uL — ABNORMAL LOW (ref 4.22–5.81)
RDW: 15.2 % (ref 11.5–15.5)
WBC: 15.5 10*3/uL — ABNORMAL HIGH (ref 4.0–10.5)
nRBC: 0 % (ref 0.0–0.2)

## 2021-01-26 LAB — BASIC METABOLIC PANEL
Anion gap: 8 (ref 5–15)
BUN: 11 mg/dL (ref 6–20)
CO2: 23 mmol/L (ref 22–32)
Calcium: 8.7 mg/dL — ABNORMAL LOW (ref 8.9–10.3)
Chloride: 105 mmol/L (ref 98–111)
Creatinine, Ser: 0.74 mg/dL (ref 0.61–1.24)
GFR, Estimated: >60 mL/min (ref >60)
Glucose, Bld: 100 mg/dL — ABNORMAL HIGH (ref 70–99)
Potassium: 3.8 mmol/L (ref 3.5–5.1)
Sodium: 136 mmol/L (ref 135–145)

## 2021-01-26 LAB — MAGNESIUM: Magnesium: 2 mg/dL (ref 1.7–2.4)

## 2021-01-26 SURGERY — COLONOSCOPY WITH PROPOFOL
Anesthesia: Monitor Anesthesia Care

## 2021-01-26 MED ORDER — PROPOFOL 500 MG/50ML IV EMUL
INTRAVENOUS | Status: DC | PRN
  Administered 2021-01-26: 125 ug/kg/min via INTRAVENOUS

## 2021-01-26 MED ORDER — OXYCODONE HCL 5 MG PO TABS
5.0000 mg | ORAL_TABLET | ORAL | Status: DC
  Administered 2021-01-26 – 2021-01-27 (×6): 5 mg via ORAL
  Filled 2021-01-26 (×6): qty 1

## 2021-01-26 MED ORDER — SODIUM CHLORIDE 0.9 % IV SOLN: INTRAVENOUS | Status: DC

## 2021-01-26 MED ORDER — MEMANTINE HCL 5 MG PO TABS
ORAL_TABLET | ORAL | 0 refills | Status: DC
Start: 2021-01-26 — End: 2021-03-09
  Filled 2021-01-26: qty 70, 30d supply, fill #0

## 2021-01-26 MED ORDER — MEMANTINE HCL 10 MG PO TABS
10.0000 mg | ORAL_TABLET | Freq: Two times a day (BID) | ORAL | 4 refills | Status: DC
  Filled 2021-01-26 – 2021-02-24 (×2): qty 60, 30d supply, fill #0

## 2021-01-26 MED ORDER — PROPOFOL 10 MG/ML IV BOLUS
INTRAVENOUS | Status: DC | PRN
  Administered 2021-01-26: 20 mg via INTRAVENOUS
  Administered 2021-01-26 (×2): 40 mg via INTRAVENOUS

## 2021-01-26 MED ORDER — LACTATED RINGERS IV SOLN: INTRAVENOUS | Status: DC | PRN

## 2021-01-26 SURGICAL SUPPLY — 22 items

## 2021-01-26 NOTE — Progress Notes (Signed)
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  Patient also with occasional diarrhea, productive cough of white/green sputum, and shortness of breath.  Onset of symptoms roughly 3-4 days prior.  Abdominal pain is worse in the left lower quadrant and after no significant improvement of his symptoms at home and now with stabbing midsternal chest pain that was constant and worse with coughing and deep breaths, patient presented to the ED for further evaluation.  In the ED, temperature 100.7 F, HR 114, RR 20, BP 154/83, SPO2 94% on room air.  Sodium 139, potassium 3.3, chloride 106, CO2 24, glucose 131, BUN 13, creatinine 1.00.  AST 19, ALT 16, total bilirubin 0.5.  Lactic acid 1.1.  WBC 20.0, hemoglobin 10.9, platelets 303.  COVID-19 PCR negative.  Influenza A/B PCR negative.  Urinalysis unrevealing.  CTA chest/abdomen/pelvis with no pulmonary embolism, cholelithiasis with possible mild gallbladder wall thickening without definite pericholecystic inflammation, unchanged left upper lobe malignancy, circumferential wall thickening proximal sigmoid colon with adjacent stranding concerning for malignancy versus chronic inflammation versus infection.  Right upper quadrant ultrasound with cholelithiasis and evidence of gallbladder adenomyomatosis.   Assessment & Plan:   Principal Problem:   Sepsis ([place]) Active Problems:   Obesity, unspecified   HYPERTENSION, BENIGN   Malignant neoplasm of upper lobe of left lung (HCC)   Adenocarcinoma of left lung, stage 4 (HCC)   Colitis  presumed infectious   Hypokalemia  Sepsis, POA Sigmoid colitis versus malignancy versus inflammatory process Patient met sepsis criteria on admission with tachypnea, tachycardia, fever, leukocytosis source of infection noted on imaging consistent with sigmoid colitis.  Urinalysis unrevealing.  Lactic acid elevated 2.1. --[facility] following; appreciate assistance --WBC 20.0>20.3>15.5 --Blood cultures x2: No growth x 2 days --Urine culture: Pending --Cefepime 2 g IV q8h --Metronidazole 500mg  IV q12h --GI plans flex sigmoidoscopy today  Atypical chest pain Patient complaining of constant chest discomfort left-sided chest.  CT angiogram chest negative for pulmonary embolism.  High sensitive troponins within normal limits.  ED with sinus tachycardia, rate 102, T wave inversions in leads II, 3, aVF, V5-V6 which are similar in appearance to EKG August 2022.  Suspect etiology likely secondary to costochondritis from significant chronic cough versus pain of malignancy with left upper lobe mass. --Continue monitor on telemetry  Hypokalemia Potassium 3.3 on admission, repleted.  Repeat potassium this a.m. 3.9. --Repeat BMP in a.m.  Essential hypertension Not on antihypertensives outpatient. --Hydralazine as needed  Stage IV (T2b, N2, M1c) non-small cell lung cancer/adenocarcinoma with brain metastasis Follows with medical/radiation oncology outpatient, Dr. Earlie Server and Dr. Dara Lords.  Currently on chemotherapy. --Seen by radiation oncology, plan simulation tomorrow  Morbid obesity Body mass index is 41.18 kg/m.  Discussed with patient needs for aggressive lifestyle changes/weight loss as this complicates all facets of care.  Outpatient follow-up with PCP.     DVT prophylaxis: Lovenox   Code Status: Full Code Family Communication: No family present at bedside  Disposition Plan:  Level of care: Progressive Status is: Inpatient  Remains inpatient appropriate because: IV antibiotics,  pending flex sigmoidoscopy today, awaiting further recommendations per GI  Consultants:  [facility]  Procedures:  none  Antimicrobials:  Vancomycin 1/22 - 1/23 Cefepime 1/22>> Metronidazole 1/22>>   Subjective: Patient seen examined at bedside, resting comfortably.  Abdominal pain continues to be much improved.  Denies any further chest pain.  Awaiting for flex sigmoidoscopy planned later this afternoon.  No other questions or concerns at this time.  Denies headache, no fever/chills/night sweats currently, no nausea/vomiting/diarrhea, no palpitations, no cough/congestion.  No acute events overnight per nursing staff.  Objective: Vitals:   01/25/21 2024 01/25/21 2120 01/25/21 2330 01/26/21 0530  BP:  (!) 153/79  (!) 146/83  Pulse:  84  97  Resp: $Remo'18 20 17 20  'dzypJ$ Temp:  98.6 F (37 C)  99.7 F (37.6 C)  TempSrc:  Oral  Oral  SpO2:  98%  96%  Weight:      Height:        Intake/Output Summary (Last 24 hours) at 01/26/2021 1214 Last data filed at 01/26/2021 1100 Gross per 24 hour  Intake 1377.7 ml  Output 3505 ml  Net -2127.3 ml   Filed Weights   01/24/21 1110  Weight: 130.2 kg    Examination:  General exam: Appears calm and comfortable, obese Respiratory system: Breath sounds left upper lobe, otherwise clear to auscultation bilaterally without wheezing/crackles, normal respiratory effort without accessory muscle use, on room air.  Cardiovascular system: S1 & S2 heard, RRR. No JVD, murmurs, rubs, gallops or clicks. No pedal edema. Gastrointestinal system: Abdomen is nondistended, soft, slight tenderness to palpation left lower quadrant. No organomegaly or masses felt. Normal bowel sounds heard. Central nervous system: Alert and oriented. No focal neurological deficits. Extremities: Symmetric 5 x 5 power. Skin: No rashes, lesions or ulcers Psychiatry: Judgement and insight appear normal. Mood & affect appropriate.     Data Reviewed: I have personally reviewed following  labs and imaging studies  CBC: Recent Labs  Lab 01/24/21 1200 01/25/21 0344 01/26/21 0405  WBC 20.0* 20.3* 15.5*  NEUTROABS 15.9*  --   --   HGB 10.9* 9.3* 10.4*  HCT 34.0* 29.3* 32.5*  MCV 97.1 99.0 98.2  PLT 303 256 166   Basic Metabolic Panel: Recent Labs  Lab 01/24/21 1200 01/24/21 1852 01/25/21 0344 01/26/21 0405  NA 139  --  136 136  K 3.3*  --  3.9 3.8  CL 106  --  106 105  CO2 24  --  25 23  GLUCOSE 131*  --  104* 100*  BUN 13  --  10 11  CREATININE 1.00  --  0.83 0.74  CALCIUM 8.8*  --  8.4* 8.7*  MG  --  2.0  --  2.0   GFR: Estimated Creatinine Clearance: 136.5 mL/min (by C-G formula based on SCr of 0.74 mg/dL). Liver Function Tests: Recent Labs  Lab 01/24/21 1200 01/25/21 0344  AST 19 16  ALT 16 12  ALKPHOS 80 66  BILITOT 0.5 0.8  PROT 7.6 6.4*  ALBUMIN 3.4* 2.9*   No results for input(s): LIPASE, AMYLASE in the last 168 hours. No results for input(s): AMMONIA in the last 168 hours. Coagulation Profile: Recent Labs  Lab 01/24/21 1200 01/25/21 0344  INR 1.1 1.2   Cardiac Enzymes: No results for input(s): CKTOTAL, CKMB, CKMBINDEX, TROPONINI in the last 168 hours. BNP (last 3 results) No results for input(s): PROBNP in the last 8760 hours. HbA1C: No results for input(s): HGBA1C in the last 72 hours. CBG: No results for input(s): GLUCAP in the last 168 hours. Lipid  Profile: No results for input(s): CHOL, HDL, LDLCALC, TRIG, CHOLHDL, LDLDIRECT in the last 72 hours. Thyroid Function Tests: No results for input(s): TSH, T4TOTAL, FREET4, T3FREE, THYROIDAB in the last 72 hours. Anemia Panel: No results for input(s): VITAMINB12, FOLATE, FERRITIN, TIBC, IRON, RETICCTPCT in the last 72 hours. Sepsis Labs: Recent Labs  Lab 01/24/21 1200 01/24/21 1852 01/24/21 2144 01/25/21 0344  PROCALCITON  --   --   --  <0.10  LATICACIDVEN 1.1 2.1* 1.8  --     Recent Results (from the past 240 hour(s))  Blood Culture (routine x 2)     Status: None  (Preliminary result)   Collection Time: 01/24/21 12:00 PM   Specimen: BLOOD  Result Value Ref Range Status   Specimen Description   Final    BLOOD LEFT ANTECUBITAL Performed at [facility], [address], [place], [place] [address]    Special Requests   Final    BOTTLES DRAWN AEROBIC AND ANAEROBIC Blood Culture results may not be optimal due to an excessive volume of blood received in culture bottles Performed at [facility], [address], [place], [place] [address]    Culture   Final    NO GROWTH 2 DAYS Performed at [facility], [address], [place], [place] [address]    Report Status PENDING  Incomplete  Blood Culture (routine x 2)     Status: None (Preliminary result)   Collection Time: 01/24/21 12:00 PM   Specimen: BLOOD  Result Value Ref Range Status   Specimen Description   Final    BLOOD RIGHT ANTECUBITAL Performed at [facility], [address], [place], [place] [address]    Special Requests   Final    BOTTLES DRAWN AEROBIC AND ANAEROBIC Blood Culture results may not be optimal due to an excessive volume of blood received in culture bottles Performed at [facility], [address], [place], [place] [address]    Culture   Final    NO GROWTH 2 DAYS Performed at [facility], [address], [place], [place] [address]    Report Status PENDING  Incomplete  Resp Panel by RT-PCR (Flu A&B, Covid) Nasopharyngeal Swab     Status: None   Collection Time: 01/24/21 12:22 PM   Specimen: Nasopharyngeal Swab; Nasopharyngeal(NP) swabs in vial transport medium  Result Value Ref Range Status   SARS Coronavirus 2 by RT PCR NEGATIVE NEGATIVE Final    Comment: (NOTE) SARS-CoV-2 target nucleic acids are NOT DETECTED.  The SARS-CoV-2 RNA is generally detectable in upper respiratory specimens during the acute phase of infection. The lowest concentration of SARS-CoV-2 viral copies this assay  can detect is 138 copies/mL. A negative result does not preclude SARS-Cov-2 infection and should not be used as the sole basis for treatment or other patient management decisions. A negative result may occur with  improper specimen collection/handling, submission of specimen other than nasopharyngeal swab, presence of viral mutation(s) within the areas targeted by this assay, and inadequate number of viral copies(<138 copies/mL). A negative result must be combined with clinical observations, patient history, and epidemiological information. The expected result is Negative.  Fact Sheet for Patients:  BloggerCourse.com  Fact Sheet for Healthcare Providers:  SeriousBroker.it  This test is no t yet approved or cleared by the [place] FDA and  has been authorized for detection and/or diagnosis of SARS-CoV-2 by FDA under an Emergency Use Authorization (EUA). This EUA will remain  in effect (meaning this test can be used) for the duration of the COVID-19 declaration under Section 564(b)(1) of the Act, 21 U.S.C.section 360bbb-3(b)(1), unless the authorization is terminated  or revoked sooner.       Influenza A by PCR NEGATIVE NEGATIVE Final   Influenza B by PCR NEGATIVE NEGATIVE Final    Comment: (NOTE) The Xpert Xpress SARS-CoV-2/FLU/RSV plus assay is intended as an aid in the diagnosis of influenza from Nasopharyngeal swab specimens and should not be used as a sole basis for treatment. Nasal washings and aspirates are unacceptable for Xpert Xpress SARS-CoV-2/FLU/RSV testing.  Fact Sheet for Patients: EntrepreneurPulse.com.au  Fact Sheet for Healthcare Providers: IncredibleEmployment.be  This test is not yet approved or cleared by the [place] FDA and has been authorized for detection and/or diagnosis of SARS-CoV-2 by FDA under an Emergency Use Authorization (EUA). This EUA will  remain in effect (meaning this test can be used) for the duration of the COVID-19 declaration under Section 564(b)(1) of the Act, 21 U.S.C. section 360bbb-3(b)(1), unless the authorization is terminated or revoked.  Performed at [facility], [place] [address], [place], [place] [address]   Urine Culture     Status: Abnormal   Collection Time: 01/24/21 12:30 PM   Specimen: In/Out Cath Urine  Result Value Ref Range Status   Specimen Description   Final    IN/OUT CATH URINE Performed at [facility] [address], [place], [place] [address]    Special Requests   Final    NONE Performed at [facility], [place] [address], [place],  [address]    Culture MULTIPLE SPECIES PRESENT, SUGGEST RECOLLECTION (A)  Final   Report Status 01/25/2021 FINAL  Final         Radiology Studies: CT Angio Chest PE W and/or Wo Contrast  Result Date: 01/24/2021 CLINICAL DATA:  61 year old male with stage IV non-small cell lung cancer with diffuse chest and abdominal pain with cough and intermittent shortness of breath. Chemotherapy in progress. EXAM: CT ANGIOGRAPHY CHEST CT ABDOMEN AND PELVIS WITH CONTRAST TECHNIQUE: Multidetector CT imaging of the chest was performed using the standard protocol during bolus administration of intravenous contrast. Multiplanar CT image reconstructions and MIPs were obtained to evaluate the vascular anatomy. Multidetector CT imaging of the abdomen and pelvis was performed using the standard protocol during bolus administration of intravenous contrast. RADIATION DOSE REDUCTION: This exam was performed according to the departmental dose-optimization program which includes automated exposure control, adjustment of the mA and/or kV according to patient size and/or use of iterative reconstruction technique. CONTRAST:  151mL OMNIPAQUE IOHEXOL 350 MG/ML SOLN COMPARISON:  01/22/2021 prior CTs FINDINGS: CTA CHEST FINDINGS  Cardiovascular: This is a technically borderline study due to respiratory motion artifact and less than optimal contrast opacification of the pulmonary arteries. No definite pulmonary emboli are identified. UPPER limits normal heart size noted. There is no evidence of thoracic aortic aneurysm. No pericardial effusion. Mediastinum/Nodes: Enlarged mediastinal and LEFT hilar lymph nodes are unchanged with index nodes as follows: A 1.2 cm prevascular node (series 1: Image 25) A 1.2 cm LEFT hilar node (1:32). No new or enlarging lymph nodes are identified since 01/22/2021. The visualized thyroid gland, trachea and esophagus are unremarkable. Lungs/Pleura: Unchanged dominant LEFT UPPER lobe mass again measures 5.8 x 4.4 x 4.5 cm (8:44) and contains a fiducial marker. Numerous adjacent satellite lesions/nodules are again identified. Numerous diffuse bilateral pulmonary nodules are unchanged from the prior study with the an index  8 mm LEFT UPPER lobe nodule (8:60). Mild dependent/basilar atelectasis is noted. No new pulmonary opacities are identified. No pleural effusion or pneumothorax noted. Musculoskeletal: No acute or suspicious bony lesions are identified. Review of the MIP images confirms the above findings. CT ABDOMEN and PELVIS FINDINGS Hepatobiliary: The liver is unremarkable. Cholelithiasis noted with possible mild gallbladder wall thickening, but without definite pericholecystic inflammation. No biliary dilatation. Pancreas: Unremarkable Spleen: Unremarkable Adrenals/Urinary Tract: The kidneys, adrenal glands and bladder are unremarkable except for a punctate nonobstructing LEFT renal calculus. Stomach/Bowel: Circumferential wall thickening of the proximal sigmoid colon with mild adjacent stranding is unchanged. There is no evidence of bowel obstruction or new bowel wall thickening. There is no evidence of appendicitis. Vascular/Lymphatic: Aortic atherosclerosis. No enlarged abdominal or pelvic lymph nodes.  Reproductive: Unremarkable Other: No ascites, focal collection or pneumoperitoneum. Musculoskeletal: No acute findings noted. Review of the MIP images confirms the above findings. IMPRESSION: 1. No evidence of acute abnormality within the chest, abdomen or pelvis. No evidence of pulmonary emboli, but sensitivity is decreased due to technical factors as described above. 2. Cholelithiasis with possible mild gallbladder wall thickening, but without definite pericholecystic inflammation. Consider further evaluation with ultrasound if there is strong clinical suspicion for acute cholecystitis. 3. Unchanged dominant LEFT UPPER lobe malignancy, diffuse bilateral pulmonary metastatic disease/nodules and mediastinal and LEFT hilar lymphadenopathy. 4. Unchanged circumferential wall thickening of the proximal sigmoid colon with mild adjacent stranding, which may represent malignancy or chronic inflammation/infection. 5. LEFT nephrolithiasis 6. Aortic Atherosclerosis (ICD10-I70.0). Electronically Signed   By: Margarette Canada M.D.   On: 01/24/2021 14:17   CT ABDOMEN PELVIS W CONTRAST  Result Date: 01/24/2021 CLINICAL DATA:  61 year old male with stage IV non-small cell lung cancer with diffuse chest and abdominal pain with cough and intermittent shortness of breath. Chemotherapy in progress. EXAM: CT ANGIOGRAPHY CHEST CT ABDOMEN AND PELVIS WITH CONTRAST TECHNIQUE: Multidetector CT imaging of the chest was performed using the standard protocol during bolus administration of intravenous contrast. Multiplanar CT image reconstructions and MIPs were obtained to evaluate the vascular anatomy. Multidetector CT imaging of the abdomen and pelvis was performed using the standard protocol during bolus administration of intravenous contrast. RADIATION DOSE REDUCTION: This exam was performed according to the departmental dose-optimization program which includes automated exposure control, adjustment of the mA and/or kV according to patient  size and/or use of iterative reconstruction technique. CONTRAST:  179mL OMNIPAQUE IOHEXOL 350 MG/ML SOLN COMPARISON:  01/22/2021 prior CTs FINDINGS: CTA CHEST FINDINGS Cardiovascular: This is a technically borderline study due to respiratory motion artifact and less than optimal contrast opacification of the pulmonary arteries. No definite pulmonary emboli are identified. UPPER limits normal heart size noted. There is no evidence of thoracic aortic aneurysm. No pericardial effusion. Mediastinum/Nodes: Enlarged mediastinal and LEFT hilar lymph nodes are unchanged with index nodes as follows: A 1.2 cm prevascular node (series 1: Image 25) A 1.2 cm LEFT hilar node (1:32). No new or enlarging lymph nodes are identified since 01/22/2021. The visualized thyroid gland, trachea and esophagus are unremarkable. Lungs/Pleura: Unchanged dominant LEFT UPPER lobe mass again measures 5.8 x 4.4 x 4.5 cm (8:44) and contains a fiducial marker. Numerous adjacent satellite lesions/nodules are again identified. Numerous diffuse bilateral pulmonary nodules are unchanged from the prior study with the an index 8 mm LEFT UPPER lobe nodule (8:60). Mild dependent/basilar atelectasis is noted. No new pulmonary opacities are identified. No pleural effusion or pneumothorax noted. Musculoskeletal: No acute or suspicious bony lesions are identified. Review of the  MIP images confirms the above findings. CT ABDOMEN and PELVIS FINDINGS Hepatobiliary: The liver is unremarkable. Cholelithiasis noted with possible mild gallbladder wall thickening, but without definite pericholecystic inflammation. No biliary dilatation. Pancreas: Unremarkable Spleen: Unremarkable Adrenals/Urinary Tract: The kidneys, adrenal glands and bladder are unremarkable except for a punctate nonobstructing LEFT renal calculus. Stomach/Bowel: Circumferential wall thickening of the proximal sigmoid colon with mild adjacent stranding is unchanged. There is no evidence of bowel  obstruction or new bowel wall thickening. There is no evidence of appendicitis. Vascular/Lymphatic: Aortic atherosclerosis. No enlarged abdominal or pelvic lymph nodes. Reproductive: Unremarkable Other: No ascites, focal collection or pneumoperitoneum. Musculoskeletal: No acute findings noted. Review of the MIP images confirms the above findings. IMPRESSION: 1. No evidence of acute abnormality within the chest, abdomen or pelvis. No evidence of pulmonary emboli, but sensitivity is decreased due to technical factors as described above. 2. Cholelithiasis with possible mild gallbladder wall thickening, but without definite pericholecystic inflammation. Consider further evaluation with ultrasound if there is strong clinical suspicion for acute cholecystitis. 3. Unchanged dominant LEFT UPPER lobe malignancy, diffuse bilateral pulmonary metastatic disease/nodules and mediastinal and LEFT hilar lymphadenopathy. 4. Unchanged circumferential wall thickening of the proximal sigmoid colon with mild adjacent stranding, which may represent malignancy or chronic inflammation/infection. 5. LEFT nephrolithiasis 6. Aortic Atherosclerosis (ICD10-I70.0). Electronically Signed   By: Margarette Canada M.D.   On: 01/24/2021 14:17   DG Chest Port 1 View  Result Date: 01/24/2021 CLINICAL DATA:  Sepsis EXAM: PORTABLE CHEST 1 VIEW COMPARISON:  Chest x-ray 09/14/2020, CT chest 01/22/2021 FINDINGS: Heart size and mediastinum are stable and within normal limits. No new consolidation or infiltrates identified in the lungs. 4.4 x 4.2 cm left upper lobe mass again visualized. No pleural effusion or pneumothorax. IMPRESSION: No acute process identified.  Left upper lobe mass. Electronically Signed   By: Ofilia Neas M.D.   On: 01/24/2021 12:41   US Abdomen Limited RUQ (LIVER/GB)  Result Date: 01/24/2021 CLINICAL DATA:  Right upper quadrant pain EXAM: ULTRASOUND ABDOMEN LIMITED RIGHT UPPER QUADRANT COMPARISON:  None. FINDINGS: Gallbladder:  Several echogenic shadowing calculi. Evidence of wall adenomyomatosis. No significant wall thickening or surrounding fluid. No sonographic Percell Miller sign reported. Common bile duct: Diameter: 5 mm Liver: No focal lesion identified. Within normal limits in parenchymal echogenicity. Portal vein is patent on color Doppler imaging with normal direction of blood flow towards the liver. Other: None. IMPRESSION: Cholelithiasis and evidence of gallbladder adenomyomatosis. Electronically Signed   By: Ofilia Neas M.D.   On: 01/24/2021 16:09        Scheduled Meds:  enoxaparin (LOVENOX) injection  60 mg Subcutaneous [id number]   folic acid  1 mg Oral Daily   guaiFENesin  600 mg Oral BID   oxyCODONE  5 mg Oral Q4H   Continuous Infusions:  sodium chloride 75 mL/hr at 01/25/21 2243   sodium chloride 20 mL/hr at 01/26/21 0831   ceFEPime (MAXIPIME) IV 2 g (01/26/21 0630)   metronidazole 500 mg (01/26/21 0843)     LOS: 2 days    Time spent: 38 minutes spent on chart review, discussion with nursing staff, consultants, updating family and interview/physical exam; more than 50% of that time was spent in counseling and/or coordination of care.    Naveya Ellerman J [place] DO [facility] Available via Epic secure chat 7am-7pm After these hours, please refer to coverage provider listed on [email or web address] 01/26/2021, 12:14 PM

## 2021-01-26 NOTE — Progress Notes (Signed)
I called and spoke with the patient to let him know we had been discussing his case with medical oncology. 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 He is in agreement with this plan. We will bring him downstairs for simulation tomorrow morning in the 8:00 hour, and anticipate starting radiation on Thursday this week. He will likely be ready to go home in the next few days too, so he understands we would continue radiation in the outpatient setting whenever he is discharged.     Carola Rhine, PAC

## 2021-01-26 NOTE — Interval H&P Note (Signed)
History and Physical Interval Note: 60/male with stage 4 lung cancer with abnormal Ct scan, suspected sigmoid mass for a colonoscopy.  01/26/2021 1:19 PM  Raymond [name]  has presented today for colonoscopy, with the diagnosis of abnormal CT.  The various methods of treatment have been discussed with the patient and family. After consideration of risks, benefits and other options for treatment, the patient has consented to  Procedure(s): COLONOSCOPY WITH PROPOFOL (N/A) as a surgical intervention.  The patient's history has been reviewed, patient examined, no change in status, stable for surgery.  I have reviewed the patient's chart and labs.  Questions were answered to the patient's satisfaction.     Ronnette Juniper

## 2021-01-26 NOTE — Transfer of Care (Signed)
Immediate Anesthesia Transfer of Care Note  Patient: HERSHY FLENNER  Procedure(s) Performed: COLONOSCOPY WITH PROPOFOL BIOPSY  Patient Location: Endoscopy Unit  Anesthesia Type:MAC  Level of Consciousness: awake and patient cooperative  Airway & Oxygen Therapy: Patient Spontanous Breathing and Patient connected to face mask oxygen  Post-op Assessment: Report given to RN and Post -op Vital signs reviewed and stable  Post vital signs: Reviewed and stable  Last Vitals:  Vitals Value Taken Time  BP    Temp    Pulse    Resp 16 01/26/21 1415  SpO2    Vitals shown include unvalidated device data.  Last Pain:  Vitals:   01/26/21 1304  TempSrc: Oral  PainSc: 8       Patients Stated Pain Goal: 3 (61/95/09 3267)  Complications: No notable events documented.

## 2021-01-26 NOTE — Consult Note (Signed)
Consultation Note Date: 01/26/2021   Patient Name: Raymond [name]  DOB: [birth year]  MRN: [id number]  Age / Sex: 61 y.o., male  PCP: Pcp, No Referring Physician: [place] Eric J, DO  Reason for Consultation: Establishing goals of care  HPI/Patient Profile: 61 y.o. 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  CTA chest/abdomen/pelvis with no pulmonary embolism, cholelithiasis with possible mild gallbladder wall thickening without definite pericholecystic inflammation, unchanged left upper lobe malignancy, circumferential wall thickening proximal sigmoid colon with adjacent stranding concerning for malignancy versus chronic inflammation versus infection.  Right upper quadrant ultrasound with cholelithiasis and evidence of gallbladder adenomyomatosis. PMT consulted to discuss [place].  Clinical Assessment and Goals of Care: I have reviewed medical records including EPIC notes, labs and imaging, assessed the patient and then met with patient  to discuss diagnosis prognosis, GOC, EOL wishes, disposition and options.  I introduced Palliative Medicine as specialized medical care for people living with serious illness. It focuses on providing relief from the symptoms and stress of a serious illness. The goal is to improve quality of life for both the patient and the family.  We discussed a brief life review of the patient. He tells me about his job - he is still working even while undergoing cancer treatment. 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 He tells me about his wife Raymond [name]; they have been together 89 years, married 15 years. He tells me about his faith and about how this is his source of strength during  difficult time.   As far as functional and nutritional status, he tells me he has slowed down some but still remains able to care for himself independently. He tells me about poor appetite since starting chemo d/t metallic taste in his mouth.    We discussed patient's current illness and what it means in the larger context of patient's on-going co-morbidities.  Natural disease trajectory and expectations at EOL were discussed. We discuss his metastatic lung cancer. We review treatment plan that has been discussed with him by oncology. He tells me his frustration in feeling like he is doing well but having repeated complications.   I attempted to elicit values and goals of care important to the patient.  He tells he wants to be able to buy his wife a house before he passes away. He is emotional talking about this. Emotional support provided.   The difference between aggressive medical intervention and comfort care was considered in light of the patient's goals of care. [name] expresses that he is interested in all interventions offered to prolong his life including CPR, intubation, and feeding tube.   Discussed with patient the importance of continued conversation with family and the medical providers regarding overall plan of care and treatment options, ensuring decisions are within the context of the patients values and GOCs.    Palliative Care services outpatient were explained and offered. Discussed referral to palliative care at the cancer center - he is agreeable.  Discussed his symptoms - he tells me he is having continued pain in abdomen and chest. He also tells me of chronic pain in his legs - ache, feels  heavy. We tells me the IV morphine is helpful. We discuss starting a PO regimen and maintaining IV morphine for breakthrough pain.  He is agreeable to trying.   Also during conversation [name] confirms that if he were ever unable to make decisions for himself he would want his wife  Raymond [name] to serve as his Media planner.   Questions and concerns were addressed. The family was encouraged to call with questions or concerns.   Primary Decision Maker PATIENT Wife Raymond [name] if patient unable   SUMMARY OF RECOMMENDATIONS   - full code/full scope - [name] expresses interest in all medical care offered to prolong life - initiate scheduled oxycodone q4hr, continue PRN IV morphine  Code Status/Advance Care Planning: Full code     Primary Diagnoses: Present on Admission:  Sepsis ([place])  Obesity, unspecified  HYPERTENSION, BENIGN  Malignant neoplasm of upper lobe of left lung (HCC)  Adenocarcinoma of left lung, stage 4 ([place])  Colitis presumed infectious  Hypokalemia   I have reviewed the medical record, interviewed the patient and family, and examined the patient. The following aspects are pertinent.  Past Medical History:  Diagnosis Date   Arthritis    Baker's cyst    right   Diverticulitis    GERD (gastroesophageal reflux disease)    Heart attack ([place]) 01/04/2004   History of hiatal hernia    Hypertension    Malignant neoplasm of upper lobe of left lung ([place]) 09/24/2020   Pneumonia    as a child   Sleep apnea    no longer on Cpap   Social History   Socioeconomic History   Marital status: Married    Spouse name: Not on file   Number of children: Not on file   Years of education: Not on file   Highest education level: Not on file  Occupational History   Not on file  Tobacco Use   Smoking status: Every Day    Types: Cigarettes   Smokeless tobacco: Never  Vaping Use   Vaping Use: Never used  Substance and Sexual Activity   Alcohol use: No   Drug use: No   Sexual activity: Not on file  Other Topics Concern   Not on file  Social History Narrative   Not on file   Social Determinants of Health   Financial Resource Strain: High Risk   Difficulty of Paying Living Expenses: Hard  Food Insecurity: No Food Insecurity   Worried About Running  Out of Food in the Last Year: Never true   Ran Out of Food in the Last Year: Never true  Transportation Needs: No Transportation Needs   Lack of Transportation (Medical): No   Lack of Transportation (Non-Medical): No  Physical Activity: Not on file  Stress: Stress Concern Present   Feeling of Stress : Very much  Social Connections: Socially Integrated   Frequency of Communication with Friends and Family: More than three times a week   Frequency of Social Gatherings with Friends and Family: More than three times a week   Attends Religious Services: More than 4 times per year   Active Member of [employer] or Organizations: Yes   Attends Music therapist: More than 4 times per year   Marital Status: Married   Family History  Problem Relation Age of Onset   Lung cancer Mother    Heart failure Sister    Scheduled Meds:  enoxaparin (LOVENOX) injection  60 mg Subcutaneous V95G   folic acid  1 mg Oral Daily   guaiFENesin  600 mg Oral BID   oxyCODONE  5 mg Oral Q4H   Continuous Infusions:  sodium chloride 75 mL/hr at 01/25/21 2243   sodium chloride 20 mL/hr at 01/26/21 0831   ceFEPime (MAXIPIME) IV 2 g (01/26/21 0630)   metronidazole 500 mg (01/26/21 0843)   PRN Meds:.acetaminophen **OR** acetaminophen, albuterol, hydrALAZINE, morphine injection, prochlorperazine Allergies  Allergen Reactions   Benadryl [Diphenhydramine] Itching    01/24/21 pt states this was a one time reaction and that he takes now when needed   Review of Systems  Constitutional:  Positive for activity change, appetite change and fatigue.  Cardiovascular:  Positive for chest pain.  Gastrointestinal:  Positive for abdominal pain.  Musculoskeletal:        Bilateral leg ache/heaviness   Physical Exam Constitutional:      General: He is not in acute distress. Pulmonary:     Effort: Pulmonary effort is normal.  Skin:    General: Skin is warm and dry.  Neurological:     Mental Status: He is alert and  oriented to person, place, and time.  Psychiatric:     Comments: tearful    Vital Signs: BP (!) 146/83 (BP Location: Left Arm)    Pulse 97    Temp 99.7 F (37.6 C) (Oral)    Resp 20    Ht $R'5\' 10"'mA$  (1.778 m)    Wt 130.2 kg    SpO2 96%    BMI 41.18 kg/m  Pain Scale: 0-10   Pain Score: Asleep   SpO2: SpO2: 96 % O2 Device:SpO2: 96 % O2 Flow Rate: .   IO: Intake/output summary:  Intake/Output Summary (Last 24 hours) at 01/26/2021 1216 Last data filed at 01/26/2021 1100 Gross per 24 hour  Intake 1377.7 ml  Output 3505 ml  Net -2127.3 ml    LBM: Last BM Date: 01/26/21 Baseline Weight: Weight: 130.2 kg Most recent weight: Weight: 130.2 kg     Palliative Assessment/Data: PPS 80%    Juel Burrow, DNP, [facility] Palliative Medicine Team [phone] Pager: [phone]

## 2021-01-26 NOTE — Op Note (Signed)
[facility] Patient Name: Raymond [name] Procedure Date: 01/26/2021 MRN: [id number] Attending MD: Ronnette Juniper , MD Date of Birth: 09/09/1960 CSN: [id number] Age: 61 Admit Type: Inpatient Procedure:                Colonoscopy Indications:              Last colonoscopy: 2014, Lower abdominal pain,                            Family history of colon cancer in a first-degree                            relative before age 15 years, Abnormal CT of the GI                            tract Providers:                Ronnette Juniper, MD, Dulcy Fanny, Luan Moore,                            Technician, Dellie Catholic Referring MD:             [facility] Medicines:                Monitored Anesthesia Care Complications:            No immediate complications. Estimated blood loss:                            Minimal. Estimated Blood Loss:     Estimated blood loss was minimal. Procedure:                Pre-Anesthesia Assessment:                           - Prior to the procedure, a History and Physical                            was performed, and patient medications and                            allergies were reviewed. The patient's tolerance of                            previous anesthesia was also reviewed. The risks                            and benefits of the procedure and the sedation                            options and risks were discussed with the patient.                            All questions were answered, and informed consent                            was obtained.  Prior Anticoagulants: The patient has                            taken no previous anticoagulant or antiplatelet                            agents. ASA Grade Assessment: III - A patient with                            severe systemic disease. After reviewing the risks                            and benefits, the patient was deemed in                            satisfactory condition to undergo  the procedure.                           After obtaining informed consent, the colonoscope                            was passed under direct vision. Throughout the                            procedure, the patient's blood pressure, pulse, and                            oxygen saturations were monitored continuously. The                            [id number]-HQ190L ([id number]) Olympus colonoscope was                            introduced through the anus and advanced to the the                            cecum, identified by appendiceal orifice and                            ileocecal valve. The colonoscopy was performed                            without difficulty. The patient tolerated the                            procedure well. The quality of the bowel                            preparation was poor. Scope In: 1:36:48 PM Scope Out: 2:07:25 PM Scope Withdrawal Time: 0 hours 25 minutes 1 second  Total Procedure Duration: 0 hours 30 minutes 37 seconds  Findings:      The perianal and digital rectal examinations were normal.      Extensive amounts of liquid semi-liquid stool was found in the entire  colon, interfering with visualization. Lavage of the area was performed,       resulting in incomplete clearance with continued poor visualization.      Multiple small and large-mouthed diverticula were found in the sigmoid       colon and descending colon.      A localized area of mildly abnormal mucosa was found in the proximal       sigmoid colon and in the distal descending colon. There was no obvious       mass noted, but the lumen appeared narrow in this area ?diverticular       disease associated colitis. Biopsies were taken with a cold forceps for       histology.      Non-bleeding internal hemorrhoids were found during retroflexion. Impression:               - Preparation of the colon was poor.                           - Stool in the entire examined colon.                            - Diverticulosis in the sigmoid colon and in the                            descending colon.                           - Abnormal mucosa in the proximal sigmoid colon and                            in the distal descending colon. Biopsied.                           - The distal rectum and anal verge are normal on                            retroflexion view. Moderate Sedation:      Propofol as per anesthesia Recommendation:           - High fiber diet.                           - Continue present medications.                           - Await pathology results.                           - Repeat colonoscopy within 3 months because the                            bowel preparation was suboptimal.                           This will be arranged as an outpatient with his  primary GI Dr.Schooler and he will need a 2 day                            prep. Procedure Code(s):        --- Professional ---                           [phone], Colonoscopy, flexible; with biopsy, single                            or multiple Diagnosis Code(s):        --- Professional ---                           K63.89, Other specified diseases of intestine                           R10.30, Lower abdominal pain, unspecified                           Z80.0, Family history of malignant neoplasm of                            digestive organs                           K57.30, Diverticulosis of large intestine without                            perforation or abscess without bleeding                           R93.3, Abnormal findings on diagnostic imaging of                            other parts of digestive tract CPT copyright 2019 American Medical Association. All rights reserved. The codes documented in this report are preliminary and upon coder review may  be revised to meet current compliance requirements. Ronnette Juniper, MD 01/26/2021 2:18:31 PM This report has been signed  electronically. Number of Addenda: 0

## 2021-01-26 NOTE — Anesthesia Preprocedure Evaluation (Signed)
Anesthesia Evaluation  Patient identified by MRN, date of birth, ID band Patient awake    Reviewed: Allergy & Precautions, NPO status , Patient's Chart, lab work & pertinent test results  Airway Mallampati: II  TM Distance: >3 FB Neck ROM: Full    Dental  (+) Missing   Pulmonary sleep apnea , Current Smoker and Patient abstained from smoking.,    Pulmonary exam normal breath sounds clear to auscultation       Cardiovascular hypertension, + CAD and + Past MI  Normal cardiovascular exam Rhythm:Regular Rate:Normal     Neuro/Psych  Headaches, Anxiety    GI/Hepatic Neg liver ROS, hiatal hernia, GERD  ,  Endo/Other  Morbid obesity  Renal/GU negative Renal ROS     Musculoskeletal  (+) Arthritis ,   Abdominal (+) + obese,   Peds  Hematology  (+) anemia ,   Anesthesia Other Findings abnormal CT  Reproductive/Obstetrics                             Anesthesia Physical Anesthesia Plan  ASA: 3  Anesthesia Plan: MAC   Post-op Pain Management:    Induction: Intravenous  PONV Risk Score and Plan: 0 and Propofol infusion and Treatment may vary due to age or medical condition  Airway Management Planned: Simple Face Mask  Additional Equipment:   Intra-op Plan:   Post-operative Plan:   Informed Consent: I have reviewed the patients History and Physical, chart, labs and discussed the procedure including the risks, benefits and alternatives for the proposed anesthesia with the patient or authorized representative who has indicated his/her understanding and acceptance.     Dental advisory given  Plan Discussed with: CRNA  Anesthesia Plan Comments:         Anesthesia Quick Evaluation

## 2021-01-26 NOTE — Anesthesia Postprocedure Evaluation (Signed)
Anesthesia Post Note  Patient: Raymond [name]  Procedure(s) Performed: COLONOSCOPY WITH PROPOFOL BIOPSY     Patient location during evaluation: Endoscopy Anesthesia Type: MAC Level of consciousness: awake Pain management: pain level controlled Vital Signs Assessment: post-procedure vital signs reviewed and stable Respiratory status: spontaneous breathing, nonlabored ventilation, respiratory function stable and patient connected to nasal cannula oxygen Cardiovascular status: stable and blood pressure returned to baseline Postop Assessment: no apparent nausea or vomiting Anesthetic complications: no   No notable events documented.  Last Vitals:  Vitals:   01/26/21 1438 01/26/21 1517  BP: (!) 176/81 (!) 174/87  Pulse: 81 83  Resp: 14 16  Temp:  37.3 C  SpO2: 90% 96%    Last Pain:  Vitals:   01/26/21 1524  TempSrc:   PainSc: 8                  Achol Azpeitia P Dalyla Chui

## 2021-01-26 NOTE — Anesthesia Procedure Notes (Signed)
Procedure Name: MAC Date/Time: 01/26/2021 1:30 PM Performed by: Claudia Desanctis, CRNA Pre-anesthesia Checklist: Patient identified, Emergency Drugs available, Suction available and Patient being monitored Patient Re-evaluated:Patient Re-evaluated prior to induction Oxygen Delivery Method: Simple face mask

## 2021-01-27 ENCOUNTER — Encounter: Payer: Self-pay | Admitting: Internal Medicine

## 2021-01-27 ENCOUNTER — Ambulatory Visit
Admission: RE | Admit: 2021-01-27 | Discharge: 2021-01-27 | Disposition: A | Discharge status: Home / Self Care | Payer: 59 | Source: Ambulatory Visit | Attending: Radiation Oncology | Admitting: Radiation Oncology

## 2021-01-27 ENCOUNTER — Other Ambulatory Visit (HOSPITAL_COMMUNITY): Payer: Self-pay

## 2021-01-27 ENCOUNTER — Encounter (HOSPITAL_COMMUNITY): Payer: Self-pay | Admitting: Gastroenterology

## 2021-01-27 DIAGNOSIS — Z510 Encounter for antineoplastic radiation therapy: Secondary | ICD-10-CM | POA: Insufficient documentation

## 2021-01-27 DIAGNOSIS — G2581 Restless legs syndrome: Secondary | ICD-10-CM

## 2021-01-27 DIAGNOSIS — C7931 Secondary malignant neoplasm of brain: Secondary | ICD-10-CM | POA: Insufficient documentation

## 2021-01-27 DIAGNOSIS — R399 Unspecified symptoms and signs involving the genitourinary system: Secondary | ICD-10-CM

## 2021-01-27 LAB — SURGICAL PATHOLOGY

## 2021-01-27 LAB — BASIC METABOLIC PANEL
Anion gap: 7 (ref 5–15)
BUN: 9 mg/dL (ref 6–20)
CO2: 26 mmol/L (ref 22–32)
Calcium: 8.6 mg/dL — ABNORMAL LOW (ref 8.9–10.3)
Chloride: 103 mmol/L (ref 98–111)
Creatinine, Ser: 0.66 mg/dL (ref 0.61–1.24)
GFR, Estimated: >60 mL/min (ref >60)
Glucose, Bld: 113 mg/dL — ABNORMAL HIGH (ref 70–99)
Potassium: 3.7 mmol/L (ref 3.5–5.1)
Sodium: 136 mmol/L (ref 135–145)

## 2021-01-27 LAB — CBC
HCT: 30 % — ABNORMAL LOW (ref 39.0–52.0)
Hemoglobin: 9.4 g/dL — ABNORMAL LOW (ref 13.0–17.0)
MCH: 30.9 pg (ref 26.0–34.0)
MCHC: 31.3 g/dL (ref 30.0–36.0)
MCV: 98.7 fL (ref 80.0–100.0)
Platelets: 279 10*3/uL (ref 150–400)
RBC: 3.04 MIL/uL — ABNORMAL LOW (ref 4.22–5.81)
RDW: 14.8 % (ref 11.5–15.5)
WBC: 12.8 10*3/uL — ABNORMAL HIGH (ref 4.0–10.5)
nRBC: 0 % (ref 0.0–0.2)

## 2021-01-27 MED ORDER — OXYCODONE HCL 5 MG PO TABS
5.0000 mg | ORAL_TABLET | Freq: Four times a day (QID) | ORAL | 0 refills | Status: AC | PRN
  Filled 2021-01-27: qty 12, 4d supply, fill #0

## 2021-01-27 MED ORDER — AMLODIPINE BESYLATE 5 MG PO TABS
5.0000 mg | ORAL_TABLET | Freq: Every day | ORAL | 0 refills | Status: DC
  Filled 2021-01-27: qty 30, 30d supply, fill #0

## 2021-01-27 MED ORDER — FOLIC ACID 1 MG PO TABS
1.0000 mg | ORAL_TABLET | Freq: Every day | ORAL | 1 refills | Status: AC
  Filled 2021-01-27: qty 30, 30d supply, fill #0
  Filled 2021-02-24: qty 30, 30d supply, fill #1

## 2021-01-27 MED ORDER — AMOXICILLIN-POT CLAVULANATE 875-125 MG PO TABS
1.0000 | ORAL_TABLET | Freq: Two times a day (BID) | ORAL | 0 refills | Status: AC
  Filled 2021-01-27: qty 14, 7d supply, fill #0

## 2021-01-27 MED ORDER — TAMSULOSIN HCL 0.4 MG PO CAPS
0.4000 mg | ORAL_CAPSULE | Freq: Every day | ORAL | 0 refills | Status: DC
Start: 2021-01-27 — End: 2021-05-03
  Filled 2021-01-27: qty 30, 30d supply, fill #0

## 2021-01-27 MED ORDER — GABAPENTIN 100 MG PO CAPS
100.0000 mg | ORAL_CAPSULE | Freq: Every day | ORAL | 0 refills | Status: DC
  Filled 2021-01-27: qty 30, 30d supply, fill #0

## 2021-01-27 NOTE — TOC Progression Note (Signed)
Transition of Care [facility]) - Progression Note    Patient Details  Name: Raymond [name] MRN: [id number] Date of Birth: Sep 21, 1960  [facility] CM/SW Contact  Purcell Mouton, RN Phone Number: 01/27/2021, 2:29 PM  Clinical Narrative:    Place 3 PCP selections for pt on AVS. Pt and RN was made aware. Also explained to pt that he may call the number on the back of his insurance card for a list of PCP's that are in network.    Expected Discharge Plan: Home/Self Care Barriers to Discharge: Continued Medical Work up  Expected Discharge Plan and Services Expected Discharge Plan: Home/Self Care   Discharge Planning Services: CM Consult   Living arrangements for the past 2 months: Apartment Expected Discharge Date: 01/27/21                                     Social Determinants of Health (SDOH) Interventions    Readmission Risk Interventions No flowsheet data found.

## 2021-01-27 NOTE — Progress Notes (Signed)
Subjective: Patient is requesting for solid food. He has not had a bowel movement since his colonoscopy yesterday. He has left lower quadrant discomfort but not pain. Wants to go home today.  Objective: Vital signs in last 24 hours: Temp:  [98.4 F (36.9 C)-99.3 F (37.4 C)] 99.3 F (37.4 C) (01/25 0541) Pulse Rate:  [78-90] 83 (01/25 0541) Resp:  [13-20] 18 (01/25 0541) BP: (144-182)/(57-96) 146/77 (01/25 0541) SpO2:  [90 %-100 %] 100 % (01/25 0541) Weight:  [129.7 kg] 129.7 kg (01/24 1304) Weight change:  Last BM Date: 01/26/21  PE: Obese, mild pallor GENERAL: Not in distress, able to speak in full sentences, on room air ABDOMEN: Soft, nondistended, mild left lower quadrant tenderness, no rebound tenderness, rigidity or guarding EXTREMITIES: No deformity  Lab Results: Results for orders placed or performed during the hospital encounter of 01/24/21 (from the past 48 hour(s))  Sedimentation rate     Status: Abnormal   Collection Time: 01/25/21  2:18 PM  Result Value Ref Range   Sed Rate 66 (H) 0 - 16 mm/hr    Comment: Performed at [facility], [place] [address], [place], [place] [address]  C-reactive protein     Status: Abnormal   Collection Time: 01/25/21  2:18 PM  Result Value Ref Range   CRP 17.2 (H) <1.0 mg/dL    Comment: Performed at [place] [address], [place], [place] [address]  CBC     Status: Abnormal   Collection Time: 01/26/21  4:05 AM  Result Value Ref Range   WBC 15.5 (H) 4.0 - 10.5 K/uL   RBC 3.31 (L) 4.22 - 5.81 MIL/uL   Hemoglobin 10.4 (L) 13.0 - 17.0 g/dL   HCT 32.5 (L) 39.0 - 52.0 %   MCV 98.2 80.0 - 100.0 fL   MCH 31.4 26.0 - 34.0 pg   MCHC 32.0 30.0 - 36.0 g/dL   RDW 15.2 11.5 - 15.5 %   Platelets 269 150 - 400 K/uL   nRBC 0.0 0.0 - 0.2 %    Comment: Performed at [facility], [place] [address] [place], [place] [address]  Basic metabolic panel     Status: Abnormal   Collection Time: 01/26/21   4:05 AM  Result Value Ref Range   Sodium 136 135 - 145 mmol/L   Potassium 3.8 3.5 - 5.1 mmol/L   Chloride 105 98 - 111 mmol/L   CO2 23 22 - 32 mmol/L   Glucose, Bld 100 (H) 70 - 99 mg/dL    Comment: Glucose reference range applies only to samples taken after fasting for at least 8 hours.   BUN 11 6 - 20 mg/dL   Creatinine, Ser 0.74 0.61 - 1.24 mg/dL   Calcium 8.7 (L) 8.9 - 10.3 mg/dL   GFR, Estimated >60 >60 mL/min    Comment: (NOTE) Calculated using the CKD-EPI Creatinine Equation (2021)    Anion gap 8 5 - 15    Comment: Performed at [facility], [place] [address], [place], [place] [address]  Magnesium     Status: None   Collection Time: 01/26/21  4:05 AM  Result Value Ref Range   Magnesium 2.0 1.7 - 2.4 mg/dL    Comment: Performed at [facility], [place] [address], [place], [place] [address]  CBC     Status: Abnormal   Collection Time: 01/27/21  3:57 AM  Result Value Ref Range   WBC 12.8 (H) 4.0 - 10.5  K/uL   RBC 3.04 (L) 4.22 - 5.81 MIL/uL   Hemoglobin 9.4 (L) 13.0 - 17.0 g/dL   HCT 30.0 (L) 39.0 - 52.0 %   MCV 98.7 80.0 - 100.0 fL   MCH 30.9 26.0 - 34.0 pg   MCHC 31.3 30.0 - 36.0 g/dL   RDW 14.8 11.5 - 15.5 %   Platelets 279 150 - 400 K/uL   nRBC 0.0 0.0 - 0.2 %    Comment: Performed at [facility], [place] [address] [place], [place] [address]  Basic metabolic panel     Status: Abnormal   Collection Time: 01/27/21  3:57 AM  Result Value Ref Range   Sodium 136 135 - 145 mmol/L   Potassium 3.7 3.5 - 5.1 mmol/L   Chloride 103 98 - 111 mmol/L   CO2 26 22 - 32 mmol/L   Glucose, Bld 113 (H) 70 - 99 mg/dL    Comment: Glucose reference range applies only to samples taken after fasting for at least 8 hours.   BUN 9 6 - 20 mg/dL   Creatinine, Ser 0.66 0.61 - 1.24 mg/dL   Calcium 8.6 (L) 8.9 - 10.3 mg/dL   GFR, Estimated >60 >60 mL/min    Comment: (NOTE) Calculated using the CKD-EPI Creatinine Equation (2021)    Anion  gap 7 5 - 15    Comment: Performed at [facility], [place] [address], [place], [place] [address]    Studies/Results: No results found.  Medications: I have reviewed the patient's current medications.  Assessment: Left lower quadrant abdominal pain Stage IV lung cancer with brain mets on chemotherapy, planning to start brain radiation from tomorrow  Ultrasound 01/24/2021 showed: Cholelithiasis, gallbladder adenomyomatosis CT 01/24/2021 showed: Circumferential wall thickening of proximal sigmoid Colonoscopy 01/26/2021: Poor prep, diverticulosis, narrowing of distal descending/proximal sigmoid?  Diverticular disease associated colitis, biopsies taken, pathology pending.  Normocytic anemia  Plan: Pathology can be followed as outpatient Will start on regular diet Currently on IV cefepime and IV metronidazole Recommend DC home on p.o. Cipro and p.o. Flagyl for 7 days Patient will be scheduled for a follow-up colonoscopy with Dr. Michail Sermon as an outpatient with 2-day prep, as he was noted to have poor prep and has a family history of colon cancer in his sister in her 90s. Patient verbalized understanding. Okay to DC home today from GI standpoint.   Ronnette Juniper, MD 01/27/2021, 10:22 AM

## 2021-01-27 NOTE — Assessment & Plan Note (Signed)
Not on home meds Start amlodipine

## 2021-01-27 NOTE — Assessment & Plan Note (Signed)
Describes RLS Start low dose gabapentin

## 2021-01-27 NOTE — Assessment & Plan Note (Signed)
flomax - describes sx that could be 2/2 BPH, start flomax at d/c

## 2021-01-27 NOTE — Assessment & Plan Note (Signed)
Met criteria for sepsis on admission CT findings c/w circumferential wall thickening of sigmoid colon with mild adjacent stranding - malignancy vs inflammation/infection S/p colonoscopy - poor prep, diverticulosis, abnormal mucosa in proximal sigmoid colon and distal descending colon -> biopsied Follow biopsies.  Needs repeat colonoscopy within 3 months.  Plan for outpatient follow up, needs 2 day prep. Discharge with augmentin (GI recommended cipro/flagyl - discussed with them, ok with augmentin)

## 2021-01-27 NOTE — Assessment & Plan Note (Signed)
Follows with radiation oncology and oncology outpatient Rad onc planning to start whole brain radiotherapy given delay in chemo and immunotherapy with this admission

## 2021-01-27 NOTE — Assessment & Plan Note (Signed)
CTA negative for PE troponins negative x2 EKG appeared similar to prior (T wave inversions/flattending in II, III, aVF, V5, V6) Thought related to chronic cough vs costochondritis vs related to malignancy

## 2021-01-27 NOTE — Discharge Summary (Signed)
Physician Discharge Summary  Raymond [name] RPZ:[id number] DOB: [birth year] DOA: 01/24/2021  PCP: Pcp, No  Admit date: 01/24/2021 Discharge date: 01/27/2021  Time spent: 40 minutes  Recommendations for Outpatient Follow-up:  Follow outpatient CBC/CMP Follow with radiation oncology outpatient Follow with oncology outpatient Follow with GI outpatient - needs follow up of bx from colonoscopy as well as follow up colonoscopy given poor prep during this procedure  Follow up what sound like LUTS -> started on flomax Follow RLS - started on gabapentin Follow blood pressure, started amlodipine   Discharge Diagnoses:  Principal Problem:   Sepsis (HCC) Active Problems:   Colitis presumed infectious   Chest pain   Adenocarcinoma of left lung, stage 4 (HCC)   HYPERTENSION, BENIGN   Hypokalemia   Obesity, unspecified   Lower urinary tract symptoms (LUTS)   RLS (restless legs syndrome)   Malignant neoplasm of upper lobe of left lung (HCC)   Discharge Condition: stable  Diet recommendation: heart healthy  Filed Weights   01/24/21 1110 01/26/21 1304  Weight: 130.2 kg 129.7 kg    History of present illness:  Raymond [name] is Raymond [name] 61 year old male with past medical history significant for stage IV (T2b, N2, M1c) non-small cell lung cancer/adenocarcinoma left upper lobe with hilar/mediastinal lymphadenopathy and metastasis to brain, essential hypertension, GERD who presented to [facility] ED on 1/22 with diffuse abdominal pain associated with cramping, left chest and arm pain.  Patient also with occasional diarrhea, productive cough of white/green sputum, and shortness of breath.  Onset of symptoms roughly 3-4 days prior.  Abdominal pain is worse in the left lower quadrant and after no significant improvement of his symptoms at home and now with stabbing midsternal chest pain that was constant and worse with coughing and deep breaths, patient presented to the ED for further evaluation.   In the ED,  temperature 100.7 F, HR 114, RR 20, BP 154/83, SPO2 94% on room air.  Sodium 139, potassium 3.3, chloride 106, CO2 24, glucose 131, BUN 13, creatinine 1.00.  AST 19, ALT 16, total bilirubin 0.5.  Lactic acid 1.1.  WBC 20.0, hemoglobin 10.9, platelets 303.  COVID-19 PCR negative.  Influenza Raymond [name]/B PCR negative.  Urinalysis unrevealing.  CTA chest/abdomen/pelvis with no pulmonary embolism, cholelithiasis with possible mild gallbladder wall thickening without definite pericholecystic inflammation, unchanged left upper lobe malignancy, circumferential wall thickening proximal sigmoid colon with adjacent stranding concerning for malignancy versus chronic inflammation versus infection.  Right upper quadrant ultrasound with cholelithiasis and evidence of gallbladder adenomyomatosis.  He was admitted and treated with antibiotics for colitis.  He's now s/p colonoscopy with pending biopsies.  Plan for discharge home with outpatient follow up.  He was seen by rad onc who is planning for radiation therapy.    See below for additional details  Hospital Course:  Colitis presumed infectious- (present on admission) Met criteria for sepsis on admission CT findings c/w circumferential wall thickening of sigmoid colon with mild adjacent stranding - malignancy vs inflammation/infection S/p colonoscopy - poor prep, diverticulosis, abnormal mucosa in proximal sigmoid colon and distal descending colon -> biopsied Follow biopsies.  Needs repeat colonoscopy within 3 months.  Plan for outpatient follow up, needs 2 day prep. Discharge with augmentin (GI recommended cipro/flagyl - discussed with them, ok with augmentin)  Chest pain CTA negative for PE troponins negative x2 EKG appeared similar to prior (T wave inversions/flattending in II, III, aVF, V5, V6) Thought related to chronic cough vs costochondritis vs related to malignancy  Adenocarcinoma of left lung, stage 4 ([place])- (present on admission) Follows with  radiation oncology and oncology outpatient Rad onc planning to start whole brain radiotherapy given delay in chemo and immunotherapy with this admission   Hypokalemia- (present on admission) improved  HYPERTENSION, BENIGN- (present on admission) Not on home meds Start amlodipine  Obesity, unspecified- (present on admission) noted  RLS (restless legs syndrome) Describes RLS Start low dose gabapentin  Lower urinary tract symptoms (LUTS) flomax - describes sx that could be 2/2 BPH, start flomax at d/c   Procedures: Colonoscopy  Impression: - Preparation of the colon was poor. - Stool in the entire examined colon. - Diverticulosis in the sigmoid colon and in the descending colon. - Abnormal mucosa in the proximal sigmoid colon and in the distal descending colon. Biopsied. - The distal rectum and anal verge are normal on retroflexion view. Recommendation - High fiber diet. - Continue present medications. - Await pathology results. - Repeat colonoscopy within 3 months because the bowel preparation was suboptimal. This will be arranged as an outpatient with his primary GI Raymond [name] and he will need Raymond [name] 2 day prep.  Consultations: GI  Discharge Exam: Vitals:   01/27/21 0541 01/27/21 1335  BP: (!) 146/77 (!) 155/79  Pulse: 83 76  Resp: 18 18  Temp: 99.3 F (37.4 C) 98.5 F (36.9 C)  SpO2: 100% 99%   Eager to go home Notes issues with restless legs, frequent urination at night  General: No acute distress. Cardiovascular: RRR Lungs: unlabored Abdomen: Soft, nontender, nondistended  Neurological: Alert and oriented 3. Moves all extremities 4 . Cranial nerves II through XII grossly intact. Skin: Warm and dry. No rashes or lesions. Extremities: No clubbing or cyanosis. No edema.   Discharge Instructions   Discharge Instructions     Amb Referral to Palliative Care   Complete by: As directed    Call MD for:  difficulty breathing, headache or visual disturbances    Complete by: As directed    Call MD for:  extreme fatigue   Complete by: As directed    Call MD for:  hives   Complete by: As directed    Call MD for:  persistant dizziness or light-headedness   Complete by: As directed    Call MD for:  persistant nausea and vomiting   Complete by: As directed    Call MD for:  redness, tenderness, or signs of infection (pain, swelling, redness, odor or green/yellow discharge around incision site)   Complete by: As directed    Call MD for:  severe uncontrolled pain   Complete by: As directed    Call MD for:  temperature >100.4   Complete by: As directed    Diet - low sodium heart healthy   Complete by: As directed    Discharge instructions   Complete by: As directed    You were seen for colitis (inflammation of the colon).  You've improved with antibiotics.  You had Aahana Elza colonoscopy which was limited by poor prep.  They did see abnormal mucosa.  They took biopsies which should be followed as an outpatient.  You'll need to follow up with gastroenterology  (Dr. Michail Sermon) as an outpatient for Giovannina Mun colonoscopy.  There's concern that these findings could be related to malignancy, so follow up is extremely important.  Follow up with radiation oncology for your radiation therapy as planned.  Follow up with Dr. Earlie Server outpatient for your metastatic non small cell lung cancer.    We'll try gabapentin  for your restless legs.  We'll try flomax for your frequent nighttime urination.  Please follow up with Fleeta Kunde PCP to adjust these medicines.  Your blood pressure is on the high side, we'll start you on amlodipine.  Return for new, recurrent, or worsening symptoms.  Please ask your PCP to request records from this hospitalization so they know what was done and what the next steps will be.   Increase activity slowly   Complete by: As directed       Allergies as of 01/27/2021       Reactions   Benadryl [diphenhydramine] Itching   01/24/21 pt states this was Renita Brocks one  time reaction and that he takes now when needed        Medication List     TAKE these medications    acetaminophen 650 MG CR tablet Commonly known as: TYLENOL Take 650 mg by mouth every 8 (eight) hours as needed for pain.   amLODipine 5 MG tablet Commonly known as: NORVASC Take 1 tablet (5 mg total) by mouth daily.   amoxicillin-clavulanate 875-125 MG tablet Commonly known as: Augmentin Take 1 tablet by mouth 2 (two) times daily for 7 days.   Benefiber Drink Mix Pack Take 1 Package by mouth daily at 6 (six) AM.   folic acid 1 MG tablet Commonly known as: FOLVITE Take 1 tablet (1 mg total) by mouth daily.   gabapentin 100 MG capsule Commonly known as: Neurontin Take 1 capsule (100 mg total) by mouth at bedtime for restless legs.   memantine 5 MG tablet Commonly known as: Namenda Begin this prescription the first day of brain radiation. Week 1: Take 1 tablet by mouth every morning. Week 2: Take 1 tablet every morning and evening. Week 3: Take 2 tablets every morning, and 1 tablet in the evening. Week 4: Take 2 tablets in the morning and evening. Fill subsequent prescription each month.   memantine 10 MG tablet Commonly known as: Namenda Take 1 tablet (10 mg total) by mouth 2 (two) times daily.   mirtazapine 15 MG tablet Commonly known as: Remeron Take 1 tablet by mouth at bedtime.   Osteo Bi-Flex Adv Joint Shield Tabs Take 1 tablet by mouth daily.   oxyCODONE 5 MG immediate release tablet Commonly known as: Oxy IR/ROXICODONE Take 1 tablet (5 mg total) by mouth every 6 (six) hours as needed for up to 3 days for severe pain.   prochlorperazine 10 MG tablet Commonly known as: COMPAZINE Take 1 tablet (10 mg total) by mouth every 6 (six) hours as needed for nausea or vomiting.   simethicone 125 MG chewable tablet Commonly known as: MYLICON Chew 299 mg by mouth every 6 (six) hours as needed for flatulence.   tamsulosin 0.4 MG Caps capsule Commonly known as:  Flomax Take 1 capsule (0.4 mg total) by mouth daily after supper.       Allergies  Allergen Reactions   Benadryl [Diphenhydramine] Itching    01/24/21 pt states this was Jaecob Lowden one time reaction and that he takes now when needed      The results of significant diagnostics from this hospitalization (including imaging, microbiology, ancillary and laboratory) are listed below for reference.    Significant Diagnostic Studies: CT Chest W Contrast  Result Date: 01/23/2021 CLINICAL DATA:  61 year old male with history of stage IV non-small cell lung cancer diagnosed in 2022. Chemotherapy in progress. Cough and shortness of breath intermittently. Follow-up study. EXAM: CT CHEST, ABDOMEN, AND PELVIS WITH CONTRAST TECHNIQUE:  Multidetector CT imaging of the chest, abdomen and pelvis was performed following the standard protocol during bolus administration of intravenous contrast. RADIATION DOSE REDUCTION: This exam was performed according to the departmental dose-optimization program which includes automated exposure control, adjustment of the mA and/or kV according to patient size and/or use of iterative reconstruction technique. CONTRAST:  157mL OMNIPAQUE IOHEXOL 300 MG/ML  SOLN COMPARISON:  Multiple priors, most recently CT the chest, abdomen and pelvis 12/17/2020. FINDINGS: CT CHEST FINDINGS Cardiovascular: Heart size is normal. There is no significant pericardial fluid, thickening or pericardial calcification. No atherosclerotic calcifications are noted in the thoracic aorta or the coronary arteries. Mediastinum/Nodes: Prevascular lymph node measuring 1.2 cm in short axis (previously 1.1 cm in short axis). Fullness of soft tissues in the left hilar region, likely lymph nodes, measuring up to 1.2 cm in short axis (axial image 25 of series 2). Esophagus is unremarkable in appearance. No axillary lymphadenopathy. Lungs/Pleura: Dominant left upper lobe mass currently measures 5.8 x 4.4 x 4.5 cm (axial image 56 of  series 6 and coronal image 95 of series 4) as compared with 6.0 x 3.7 x 4.6 cm on the prior study. Laketra Bowdish fiducial marker is again noted within this lesion. Numerous satellite nodules are again noted adjacent to the lesion and elsewhere throughout the left upper lobe, largest of which measures 8 mm (axial image 70 of series 6), increased from 6 mm on the prior study. In addition, there are multiple other small pulmonary nodules scattered throughout the lungs bilaterally indicative of widespread hematogenous metastases. These appear similar in number, but generally slightly increased in size compared to the prior examination. An example of this is Alexarae Oliva 8 mm nodule in the medial aspect of the right lower lobe (axial image 99 of series 6) which previously measured only 6 mm. No acute consolidative airspace disease. No pleural effusions. Musculoskeletal: There are no aggressive appearing lytic or blastic lesions noted in the visualized portions of the skeleton. CT ABDOMEN PELVIS FINDINGS Hepatobiliary: No suspicious cystic or solid hepatic lesions. No intra or extrahepatic biliary ductal dilatation. Multiple noncalcified gallstones are noted within the lumen of the gallbladder which appears compressed around the indwelling gallstones. No pericholecystic fluid or surrounding inflammatory changes to suggest an acute cholecystitis at this time. Pancreas: No pancreatic mass. No pancreatic ductal dilatation. No pancreatic or peripancreatic fluid collections or inflammatory changes. Spleen: Unremarkable. Adrenals/Urinary Tract: 2 mm nonobstructive calculus in the interpolar region of the left kidney. Right kidney and right adrenal gland are normal in appearance. There is again Paola Aleshire slightly nodular contour of the left adrenal gland, stable compared to prior studies, previously not hypermetabolic on PET-CT, presumably benign. No hydroureteronephrosis. Urinary bladder is nearly decompressed, but otherwise unremarkable in appearance.  Stomach/Bowel: The appearance of the stomach is normal. There is no pathologic dilatation of small bowel or colon. There is again mass-like mural thickening involving the distal descending colon and proximal sigmoid colon, best appreciated on axial images 87-99 of series 2, where there is also extensive soft tissue stranding in the adjacent pericolonic fat which has been chronic over several prior examinations, concerning for probable neoplasm with local infiltration of soft tissues. Normal appendix. Vascular/Lymphatic: Aortic atherosclerosis. No aneurysm or dissection noted in the abdominal or pelvic vasculature. No lymphadenopathy noted in the abdomen or pelvis. Reproductive: Prostate gland and seminal vesicles are unremarkable in appearance. Other: No significant volume of ascites.  No pneumoperitoneum. Musculoskeletal: There are no aggressive appearing lytic or blastic lesions noted in the visualized portions  of the skeleton. IMPRESSION: 1. Today's study again demonstrates progressive disease. Although the previously noted left upper lobe mass is similar in size, there is enlargement of numerous surrounding satellite nodules and numerous hematogenous metastases throughout the lungs, as above. Slight enlargement of left hilar lymph node and prevascular lymph node also noted. 2. No definite signs of metastatic disease in the abdomen or pelvis. However, there is persistent mural thickening, luminal narrowing and surrounding inflammatory changes in the colon involving the distal descending colon and proximal sigmoid colon, which given the chronicity of this finding is highly concerning for primary colonic neoplasm. 3. Cholelithiasis. 4. 2 mm nonobstructive calculus in the interpolar collecting system of the left kidney. 5. Aortic atherosclerosis. 6. Additional incidental findings, as above. Electronically Signed   By: Vinnie Langton M.D.   On: 01/23/2021 07:36   CT Angio Chest PE W and/or Wo Contrast  Result  Date: 01/24/2021 CLINICAL DATA:  61 year old male with stage IV non-small cell lung cancer with diffuse chest and abdominal pain with cough and intermittent shortness of breath. Chemotherapy in progress. EXAM: CT ANGIOGRAPHY CHEST CT ABDOMEN AND PELVIS WITH CONTRAST TECHNIQUE: Multidetector CT imaging of the chest was performed using the standard protocol during bolus administration of intravenous contrast. Multiplanar CT image reconstructions and MIPs were obtained to evaluate the vascular anatomy. Multidetector CT imaging of the abdomen and pelvis was performed using the standard protocol during bolus administration of intravenous contrast. RADIATION DOSE REDUCTION: This exam was performed according to the departmental dose-optimization program which includes automated exposure control, adjustment of the mA and/or kV according to patient size and/or use of iterative reconstruction technique. CONTRAST:  132mL OMNIPAQUE IOHEXOL 350 MG/ML SOLN COMPARISON:  01/22/2021 prior CTs FINDINGS: CTA CHEST FINDINGS Cardiovascular: This is Alaiya Martindelcampo technically borderline study due to respiratory motion artifact and less than optimal contrast opacification of the pulmonary arteries. No definite pulmonary emboli are identified. UPPER limits normal heart size noted. There is no evidence of thoracic aortic aneurysm. No pericardial effusion. Mediastinum/Nodes: Enlarged mediastinal and LEFT hilar lymph nodes are unchanged with index nodes as follows: Terran Hollenkamp 1.2 cm prevascular node (series 1: Image 25) Mersedes Alber 1.2 cm LEFT hilar node (1:32). No new or enlarging lymph nodes are identified since 01/22/2021. The visualized thyroid gland, trachea and esophagus are unremarkable. Lungs/Pleura: Unchanged dominant LEFT UPPER lobe mass again measures 5.8 x 4.4 x 4.5 cm (8:44) and contains Cesar Rogerson fiducial marker. Numerous adjacent satellite lesions/nodules are again identified. Numerous diffuse bilateral pulmonary nodules are unchanged from the prior study with the an  index 8 mm LEFT UPPER lobe nodule (8:60). Mild dependent/basilar atelectasis is noted. No new pulmonary opacities are identified. No pleural effusion or pneumothorax noted. Musculoskeletal: No acute or suspicious bony lesions are identified. Review of the MIP images confirms the above findings. CT ABDOMEN and PELVIS FINDINGS Hepatobiliary: The liver is unremarkable. Cholelithiasis noted with possible mild gallbladder wall thickening, but without definite pericholecystic inflammation. No biliary dilatation. Pancreas: Unremarkable Spleen: Unremarkable Adrenals/Urinary Tract: The kidneys, adrenal glands and bladder are unremarkable except for Mehran Guderian punctate nonobstructing LEFT renal calculus. Stomach/Bowel: Circumferential wall thickening of the proximal sigmoid colon with mild adjacent stranding is unchanged. There is no evidence of bowel obstruction or new bowel wall thickening. There is no evidence of appendicitis. Vascular/Lymphatic: Aortic atherosclerosis. No enlarged abdominal or pelvic lymph nodes. Reproductive: Unremarkable Other: No ascites, focal collection or pneumoperitoneum. Musculoskeletal: No acute findings noted. Review of the MIP images confirms the above findings. IMPRESSION: 1. No evidence of acute abnormality  within the chest, abdomen or pelvis. No evidence of pulmonary emboli, but sensitivity is decreased due to technical factors as described above. 2. Cholelithiasis with possible mild gallbladder wall thickening, but without definite pericholecystic inflammation. Consider further evaluation with ultrasound if there is strong clinical suspicion for acute cholecystitis. 3. Unchanged dominant LEFT UPPER lobe malignancy, diffuse bilateral pulmonary metastatic disease/nodules and mediastinal and LEFT hilar lymphadenopathy. 4. Unchanged circumferential wall thickening of the proximal sigmoid colon with mild adjacent stranding, which may represent malignancy or chronic inflammation/infection. 5. LEFT  nephrolithiasis 6. Aortic Atherosclerosis (ICD10-I70.0). Electronically Signed   By: Margarette Canada M.D.   On: 01/24/2021 14:17   MR Brain W Wo Contrast  Result Date: 01/22/2021 CLINICAL DATA:  Brain metastases EXAM: MRI HEAD WITHOUT AND WITH CONTRAST TECHNIQUE: Multiplanar, multiecho pulse sequences of the brain and surrounding structures were obtained without and with intravenous contrast. CONTRAST:  43mL GADAVIST GADOBUTROL 1 MMOL/ML IV SOLN COMPARISON:  Brain MRI 10/08/2020 FINDINGS: Brain: There are numerous (28 individual lesions identified, 14 on the prior study) enhancing intracranial lesions in the bilateral cerebral and cerebellar hemispheres. Many of the lesions are new since 10/08/2020. Examples of new lesions include: *3 mm lesion in the right frontoparietal region cortex at the midline (1200-253). *3 mm lesion in the left superior frontal gyrus at the midline (0762-263). *Two 3 mm enhancing lesions in the left superior frontal gyrus near the midline (1200-220). *Punctate lesion in the right parietal lobe (3354-562). *2 punctate lesions in the right frontal lobe anterolaterally (1200-213). *4 mm lesion in the right frontal operculum (1200-192). *4 mm lesion in the right temporal lobe (1200-191). *4 mm lesion in the left temporal lobe (1200-191). *3 mm lesion in the left caudate head (1200-183). Some of the lesions demonstrate SWI signal dropout consistent with hemorrhage. There is mild edema in the right parietal lobe, increased since the prior study. There is no other significant perilesional edema Background parenchymal volume is normal. There is no evidence of acute intracranial hemorrhage, extra-axial fluid collection, or infarct. There is no midline shift. Vascular: Normal flow voids. Skull and upper cervical spine: No suspicious osseous lesions are seen. Sinuses/Orbits: There is mild mucosal thickening in the paranasal sinuses. The globes and orbits are unremarkable. Other: There is Calene Paradiso right  mastoid effusion, new since the prior study. IMPRESSION: 1. Significant increase in number of subcentimeter enhancing metastatic lesions in both cerebral and cerebellar hemispheres, with at least 28 identified on the current study, previously 14. 2. Slightly increased edema in the right parietal lobe. No other significant perilesional edema. 3. New right mastoid effusion. Electronically Signed   By: Valetta Mole M.D.   On: 01/22/2021 09:27   CT ABDOMEN PELVIS W CONTRAST  Result Date: 01/24/2021 CLINICAL DATA:  61 year old male with stage IV non-small cell lung cancer with diffuse chest and abdominal pain with cough and intermittent shortness of breath. Chemotherapy in progress. EXAM: CT ANGIOGRAPHY CHEST CT ABDOMEN AND PELVIS WITH CONTRAST TECHNIQUE: Multidetector CT imaging of the chest was performed using the standard protocol during bolus administration of intravenous contrast. Multiplanar CT image reconstructions and MIPs were obtained to evaluate the vascular anatomy. Multidetector CT imaging of the abdomen and pelvis was performed using the standard protocol during bolus administration of intravenous contrast. RADIATION DOSE REDUCTION: This exam was performed according to the departmental dose-optimization program which includes automated exposure control, adjustment of the mA and/or kV according to patient size and/or use of iterative reconstruction technique. CONTRAST:  113mL OMNIPAQUE IOHEXOL 350 MG/ML SOLN  COMPARISON:  01/22/2021 prior CTs FINDINGS: CTA CHEST FINDINGS Cardiovascular: This is Augustin Bun technically borderline study due to respiratory motion artifact and less than optimal contrast opacification of the pulmonary arteries. No definite pulmonary emboli are identified. UPPER limits normal heart size noted. There is no evidence of thoracic aortic aneurysm. No pericardial effusion. Mediastinum/Nodes: Enlarged mediastinal and LEFT hilar lymph nodes are unchanged with index nodes as follows: Jaiyah Beining 1.2 cm  prevascular node (series 1: Image 25) Carrianne Hyun 1.2 cm LEFT hilar node (1:32). No new or enlarging lymph nodes are identified since 01/22/2021. The visualized thyroid gland, trachea and esophagus are unremarkable. Lungs/Pleura: Unchanged dominant LEFT UPPER lobe mass again measures 5.8 x 4.4 x 4.5 cm (8:44) and contains Deante Blough fiducial marker. Numerous adjacent satellite lesions/nodules are again identified. Numerous diffuse bilateral pulmonary nodules are unchanged from the prior study with the an index 8 mm LEFT UPPER lobe nodule (8:60). Mild dependent/basilar atelectasis is noted. No new pulmonary opacities are identified. No pleural effusion or pneumothorax noted. Musculoskeletal: No acute or suspicious bony lesions are identified. Review of the MIP images confirms the above findings. CT ABDOMEN and PELVIS FINDINGS Hepatobiliary: The liver is unremarkable. Cholelithiasis noted with possible mild gallbladder wall thickening, but without definite pericholecystic inflammation. No biliary dilatation. Pancreas: Unremarkable Spleen: Unremarkable Adrenals/Urinary Tract: The kidneys, adrenal glands and bladder are unremarkable except for Brettany Sydney punctate nonobstructing LEFT renal calculus. Stomach/Bowel: Circumferential wall thickening of the proximal sigmoid colon with mild adjacent stranding is unchanged. There is no evidence of bowel obstruction or new bowel wall thickening. There is no evidence of appendicitis. Vascular/Lymphatic: Aortic atherosclerosis. No enlarged abdominal or pelvic lymph nodes. Reproductive: Unremarkable Other: No ascites, focal collection or pneumoperitoneum. Musculoskeletal: No acute findings noted. Review of the MIP images confirms the above findings. IMPRESSION: 1. No evidence of acute abnormality within the chest, abdomen or pelvis. No evidence of pulmonary emboli, but sensitivity is decreased due to technical factors as described above. 2. Cholelithiasis with possible mild gallbladder wall thickening, but  without definite pericholecystic inflammation. Consider further evaluation with ultrasound if there is strong clinical suspicion for acute cholecystitis. 3. Unchanged dominant LEFT UPPER lobe malignancy, diffuse bilateral pulmonary metastatic disease/nodules and mediastinal and LEFT hilar lymphadenopathy. 4. Unchanged circumferential wall thickening of the proximal sigmoid colon with mild adjacent stranding, which may represent malignancy or chronic inflammation/infection. 5. LEFT nephrolithiasis 6. Aortic Atherosclerosis (ICD10-I70.0). Electronically Signed   By: Margarette Canada M.D.   On: 01/24/2021 14:17   CT Abdomen Pelvis W Contrast  Result Date: 01/23/2021 CLINICAL DATA:  61 year old male with history of stage IV non-small cell lung cancer diagnosed in 2022. Chemotherapy in progress. Cough and shortness of breath intermittently. Follow-up study. EXAM: CT CHEST, ABDOMEN, AND PELVIS WITH CONTRAST TECHNIQUE: Multidetector CT imaging of the chest, abdomen and pelvis was performed following the standard protocol during bolus administration of intravenous contrast. RADIATION DOSE REDUCTION: This exam was performed according to the departmental dose-optimization program which includes automated exposure control, adjustment of the mA and/or kV according to patient size and/or use of iterative reconstruction technique. CONTRAST:  139mL OMNIPAQUE IOHEXOL 300 MG/ML  SOLN COMPARISON:  Multiple priors, most recently CT the chest, abdomen and pelvis 12/17/2020. FINDINGS: CT CHEST FINDINGS Cardiovascular: Heart size is normal. There is no significant pericardial fluid, thickening or pericardial calcification. No atherosclerotic calcifications are noted in the thoracic aorta or the coronary arteries. Mediastinum/Nodes: Prevascular lymph node measuring 1.2 cm in short axis (previously 1.1 cm in short axis). Fullness of soft tissues in the  left hilar region, likely lymph nodes, measuring up to 1.2 cm in short axis (axial image  25 of series 2). Esophagus is unremarkable in appearance. No axillary lymphadenopathy. Lungs/Pleura: Dominant left upper lobe mass currently measures 5.8 x 4.4 x 4.5 cm (axial image 56 of series 6 and coronal image 95 of series 4) as compared with 6.0 x 3.7 x 4.6 cm on the prior study. Jodette Wik fiducial marker is again noted within this lesion. Numerous satellite nodules are again noted adjacent to the lesion and elsewhere throughout the left upper lobe, largest of which measures 8 mm (axial image 70 of series 6), increased from 6 mm on the prior study. In addition, there are multiple other small pulmonary nodules scattered throughout the lungs bilaterally indicative of widespread hematogenous metastases. These appear similar in number, but generally slightly increased in size compared to the prior examination. An example of this is Leeasia Secrist 8 mm nodule in the medial aspect of the right lower lobe (axial image 99 of series 6) which previously measured only 6 mm. No acute consolidative airspace disease. No pleural effusions. Musculoskeletal: There are no aggressive appearing lytic or blastic lesions noted in the visualized portions of the skeleton. CT ABDOMEN PELVIS FINDINGS Hepatobiliary: No suspicious cystic or solid hepatic lesions. No intra or extrahepatic biliary ductal dilatation. Multiple noncalcified gallstones are noted within the lumen of the gallbladder which appears compressed around the indwelling gallstones. No pericholecystic fluid or surrounding inflammatory changes to suggest an acute cholecystitis at this time. Pancreas: No pancreatic mass. No pancreatic ductal dilatation. No pancreatic or peripancreatic fluid collections or inflammatory changes. Spleen: Unremarkable. Adrenals/Urinary Tract: 2 mm nonobstructive calculus in the interpolar region of the left kidney. Right kidney and right adrenal gland are normal in appearance. There is again Alexiz Cothran slightly nodular contour of the left adrenal gland, stable compared to  prior studies, previously not hypermetabolic on PET-CT, presumably benign. No hydroureteronephrosis. Urinary bladder is nearly decompressed, but otherwise unremarkable in appearance. Stomach/Bowel: The appearance of the stomach is normal. There is no pathologic dilatation of small bowel or colon. There is again mass-like mural thickening involving the distal descending colon and proximal sigmoid colon, best appreciated on axial images 87-99 of series 2, where there is also extensive soft tissue stranding in the adjacent pericolonic fat which has been chronic over several prior examinations, concerning for probable neoplasm with local infiltration of soft tissues. Normal appendix. Vascular/Lymphatic: Aortic atherosclerosis. No aneurysm or dissection noted in the abdominal or pelvic vasculature. No lymphadenopathy noted in the abdomen or pelvis. Reproductive: Prostate gland and seminal vesicles are unremarkable in appearance. Other: No significant volume of ascites.  No pneumoperitoneum. Musculoskeletal: There are no aggressive appearing lytic or blastic lesions noted in the visualized portions of the skeleton. IMPRESSION: 1. Today's study again demonstrates progressive disease. Although the previously noted left upper lobe mass is similar in size, there is enlargement of numerous surrounding satellite nodules and numerous hematogenous metastases throughout the lungs, as above. Slight enlargement of left hilar lymph node and prevascular lymph node also noted. 2. No definite signs of metastatic disease in the abdomen or pelvis. However, there is persistent mural thickening, luminal narrowing and surrounding inflammatory changes in the colon involving the distal descending colon and proximal sigmoid colon, which given the chronicity of this finding is highly concerning for primary colonic neoplasm. 3. Cholelithiasis. 4. 2 mm nonobstructive calculus in the interpolar collecting system of the left kidney. 5. Aortic  atherosclerosis. 6. Additional incidental findings, as above. Electronically Signed  By: Vinnie Langton M.D.   On: 01/23/2021 07:36   DG Chest Port 1 View  Result Date: 01/24/2021 CLINICAL DATA:  Sepsis EXAM: PORTABLE CHEST 1 VIEW COMPARISON:  Chest x-ray 09/14/2020, CT chest 01/22/2021 FINDINGS: Heart size and mediastinum are stable and within normal limits. No new consolidation or infiltrates identified in the lungs. 4.4 x 4.2 cm left upper lobe mass again visualized. No pleural effusion or pneumothorax. IMPRESSION: No acute process identified.  Left upper lobe mass. Electronically Signed   By: Ofilia Neas M.D.   On: 01/24/2021 12:41   US Abdomen Limited RUQ (LIVER/GB)  Result Date: 01/24/2021 CLINICAL DATA:  Right upper quadrant pain EXAM: ULTRASOUND ABDOMEN LIMITED RIGHT UPPER QUADRANT COMPARISON:  None. FINDINGS: Gallbladder: Several echogenic shadowing calculi. Evidence of wall adenomyomatosis. No significant wall thickening or surrounding fluid. No sonographic Percell Miller sign reported. Common bile duct: Diameter: 5 mm Liver: No focal lesion identified. Within normal limits in parenchymal echogenicity. Portal vein is patent on color Doppler imaging with normal direction of blood flow towards the liver. Other: None. IMPRESSION: Cholelithiasis and evidence of gallbladder adenomyomatosis. Electronically Signed   By: Ofilia Neas M.D.   On: 01/24/2021 16:09    Microbiology: Recent Results (from the past 240 hour(s))  Blood Culture (routine x 2)     Status: None (Preliminary result)   Collection Time: 01/24/21 12:00 PM   Specimen: BLOOD  Result Value Ref Range Status   Specimen Description   Final    BLOOD LEFT ANTECUBITAL Performed at [facility] [address], [place], [place] [address]    Special Requests   Final    BOTTLES DRAWN AEROBIC AND ANAEROBIC Blood Culture results may not be optimal due to an excessive volume of blood received in culture  bottles Performed at [facility] [address] [place], [place] [address]    Culture   Final    NO GROWTH 3 DAYS Performed at [facility], [place] [address], [place], [place] [address]    Report Status PENDING  Incomplete  Blood Culture (routine x 2)     Status: None (Preliminary result)   Collection Time: 01/24/21 12:00 PM   Specimen: BLOOD  Result Value Ref Range Status   Specimen Description   Final    BLOOD RIGHT ANTECUBITAL Performed at [facility] [address], [place], [place] [address]    Special Requests   Final    BOTTLES DRAWN AEROBIC AND ANAEROBIC Blood Culture results may not be optimal due to an excessive volume of blood received in culture bottles Performed at [facility] [address] [place], [place] [address]    Culture   Final    NO GROWTH 3 DAYS Performed at [facility], [place] [address], [place], [place] [address]    Report Status PENDING  Incomplete  Resp Panel by RT-PCR (Flu Rondia Higginbotham&B, Covid) Nasopharyngeal Swab     Status: None   Collection Time: 01/24/21 12:22 PM   Specimen: Nasopharyngeal Swab; Nasopharyngeal(NP) swabs in vial transport medium  Result Value Ref Range Status   SARS Coronavirus 2 by RT PCR NEGATIVE NEGATIVE Final    Comment: (NOTE) SARS-CoV-2 target nucleic acids are NOT DETECTED.  The SARS-CoV-2 RNA is generally detectable in upper respiratory specimens during the acute phase of infection. The lowest concentration of SARS-CoV-2 viral copies this assay can detect is 138 copies/mL. Oliva Montecalvo negative result does not preclude SARS-Cov-2 infection and should not be  used as the sole basis for treatment or other patient management decisions. Yoni Lobos negative result may occur with  improper specimen collection/handling, submission of specimen other than nasopharyngeal swab, presence of viral mutation(s) within the areas targeted by this assay, and inadequate number of  viral copies(<138 copies/mL). Mishti Swanton negative result must be combined with clinical observations, patient history, and epidemiological information. The expected result is Negative.  Fact Sheet for Patients:  EntrepreneurPulse.com.au  Fact Sheet for Healthcare Providers:  IncredibleEmployment.be  This test is no t yet approved or cleared by the [place] FDA and  has been authorized for detection and/or diagnosis of SARS-CoV-2 by FDA under an Emergency Use Authorization (EUA). This EUA will remain  in effect (meaning this test can be used) for the duration of the COVID-19 declaration under Section 564(b)(1) of the Act, 21 U.S.C.section 360bbb-3(b)(1), unless the authorization is terminated  or revoked sooner.       Influenza Kadeja Granada by PCR NEGATIVE NEGATIVE Final   Influenza B by PCR NEGATIVE NEGATIVE Final    Comment: (NOTE) The Xpert Xpress SARS-CoV-2/FLU/RSV plus assay is intended as an aid in the diagnosis of influenza from Nasopharyngeal swab specimens and should not be used as Kjirsten Bloodgood sole basis for treatment. Nasal washings and aspirates are unacceptable for Xpert Xpress SARS-CoV-2/FLU/RSV testing.  Fact Sheet for Patients: EntrepreneurPulse.com.au  Fact Sheet for Healthcare Providers: IncredibleEmployment.be  This test is not yet approved or cleared by the [place] FDA and has been authorized for detection and/or diagnosis of SARS-CoV-2 by FDA under an Emergency Use Authorization (EUA). This EUA will remain in effect (meaning this test can be used) for the duration of the COVID-19 declaration under Section 564(b)(1) of the Act, 21 U.S.C. section 360bbb-3(b)(1), unless the authorization is terminated or revoked.  Performed at [facility], [place] [address], [place], [place] [address]   Urine Culture     Status: Abnormal   Collection Time: 01/24/21 12:30 PM   Specimen: In/Out Cath  Urine  Result Value Ref Range Status   Specimen Description   Final    IN/OUT CATH URINE Performed at [facility] [address], [place], [place] [address]    Special Requests   Final    NONE Performed at [facility], [place] [address] [place], [place] [address]    Culture MULTIPLE SPECIES PRESENT, SUGGEST RECOLLECTION (Nathalie Cavendish)  Final   Report Status 01/25/2021 FINAL  Final     Labs: Basic Metabolic Panel: Recent Labs  Lab 01/24/21 1200 01/24/21 1852 01/25/21 0344 01/26/21 0405 01/27/21 0357  NA 139  --  136 136 136  K 3.3*  --  3.9 3.8 3.7  CL 106  --  106 105 103  CO2 24  --  $R'25 23 26  'LX$ GLUCOSE 131*  --  104* 100* 113*  BUN 13  --  $R'10 11 9  'hf$ CREATININE 1.00  --  0.83 0.74 0.66  CALCIUM 8.8*  --  8.4* 8.7* 8.6*  MG  --  2.0  --  2.0  --    Liver Function Tests: Recent Labs  Lab 01/24/21 1200 01/25/21 0344  AST 19 16  ALT 16 12  ALKPHOS 80 66  BILITOT 0.5 0.8  PROT 7.6 6.4*  ALBUMIN 3.4* 2.9*   No results for input(s): LIPASE, AMYLASE in the last 168 hours. No results for input(s): AMMONIA in the last 168 hours. CBC: Recent Labs  Lab 01/24/21 1200 01/25/21 0344 01/26/21 0405 01/27/21 0357  WBC 20.0* 20.3* 15.5* 12.8*  NEUTROABS 15.9*  --   --   --   HGB 10.9* 9.3* 10.4* 9.4*  HCT 34.0* 29.3* 32.5* 30.0*  MCV 97.1 99.0 98.2 98.7  PLT 303 256 269 279   Cardiac Enzymes: No results for input(s): CKTOTAL, CKMB, CKMBINDEX, TROPONINI in the last 168 hours. BNP: BNP (last 3 results) No results for input(s): BNP in the last 8760 hours.  ProBNP (last 3 results) No results for input(s): PROBNP in the last 8760 hours.  CBG: No results for input(s): GLUCAP in the last 168 hours.     Signed:  Fayrene Helper MD.  [facility] 01/27/2021, 2:23 PM

## 2021-01-27 NOTE — Plan of Care (Signed)
PIV removed. DC paperwork reviewed and questions answered. PCP info included and instructions given to pt.   Problem: Education: Goal: Knowledge of General Education information will improve Description: Including pain rating scale, medication(s)/side effects and non-pharmacologic comfort measures Outcome: Completed/Met   Problem: Health Behavior/Discharge Planning: Goal: Ability to manage health-related needs will improve Outcome: Completed/Met   Problem: Clinical Measurements: Goal: Ability to maintain clinical measurements within normal limits will improve Outcome: Completed/Met Goal: Will remain free from infection Outcome: Completed/Met Goal: Diagnostic test results will improve Outcome: Completed/Met Goal: Respiratory complications will improve Outcome: Completed/Met Goal: Cardiovascular complication will be avoided Outcome: Completed/Met   Problem: Activity: Goal: Risk for activity intolerance will decrease Outcome: Completed/Met   Problem: Nutrition: Goal: Adequate nutrition will be maintained Outcome: Completed/Met   Problem: Coping: Goal: Level of anxiety will decrease Outcome: Completed/Met   Problem: Elimination: Goal: Will not experience complications related to bowel motility Outcome: Completed/Met Goal: Will not experience complications related to urinary retention Outcome: Completed/Met   Problem: Pain Managment: Goal: General experience of comfort will improve Outcome: Completed/Met   Problem: Safety: Goal: Ability to remain free from injury will improve Outcome: Completed/Met   Problem: Skin Integrity: Goal: Risk for impaired skin integrity will decrease Outcome: Completed/Met

## 2021-01-27 NOTE — Assessment & Plan Note (Signed)
noted 

## 2021-01-27 NOTE — Assessment & Plan Note (Signed)
improved

## 2021-01-27 NOTE — Hospital Course (Signed)
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  Patient also with occasional diarrhea, productive cough of white/green sputum, and shortness of breath.  Onset of symptoms roughly 3-4 days prior.  Abdominal pain is worse in the left lower quadrant and after no significant improvement of his symptoms at home and now with stabbing midsternal chest pain that was constant and worse with coughing and deep breaths, patient presented to the ED for further evaluation.   In the ED, temperature 100.7 F, HR 114, RR 20, BP 154/83, SPO2 94% on room air.  Sodium 139, potassium 3.3, chloride 106, CO2 24, glucose 131, BUN 13, creatinine 1.00.  AST 19, ALT 16, total bilirubin 0.5.  Lactic acid 1.1.  WBC 20.0, hemoglobin 10.9, platelets 303.  COVID-19 PCR negative.  Influenza Eagan Shifflett/B PCR negative.  Urinalysis unrevealing.  CTA chest/abdomen/pelvis with no pulmonary embolism, cholelithiasis with possible mild gallbladder wall thickening without definite pericholecystic inflammation, unchanged left upper lobe malignancy, circumferential wall thickening proximal sigmoid colon with adjacent stranding concerning for malignancy versus chronic inflammation versus infection.  Right upper quadrant ultrasound with cholelithiasis and evidence of gallbladder adenomyomatosis.  He was admitted and treated with antibiotics for colitis.  He's now s/p colonoscopy with pending biopsies.  Plan for discharge home with outpatient follow up.  He was seen by rad onc who is planning for radiation therapy.    See below for additional details

## 2021-01-28 ENCOUNTER — Ambulatory Visit
Admission: RE | Admit: 2021-01-28 | Discharge: 2021-01-28 | Disposition: A | Discharge status: Home / Self Care | Payer: 59 | Source: Ambulatory Visit | Attending: Radiation Oncology | Admitting: Radiation Oncology

## 2021-01-28 DIAGNOSIS — C7931 Secondary malignant neoplasm of brain: Secondary | ICD-10-CM | POA: Diagnosis present

## 2021-01-28 DIAGNOSIS — Z510 Encounter for antineoplastic radiation therapy: Secondary | ICD-10-CM | POA: Diagnosis present

## 2021-01-29 ENCOUNTER — Other Ambulatory Visit: Payer: Self-pay

## 2021-01-29 ENCOUNTER — Ambulatory Visit
Admission: RE | Admit: 2021-01-29 | Discharge: 2021-01-29 | Disposition: A | Discharge status: Home / Self Care | Payer: 59 | Source: Ambulatory Visit | Attending: Radiation Oncology | Admitting: Radiation Oncology

## 2021-01-29 DIAGNOSIS — Z510 Encounter for antineoplastic radiation therapy: Secondary | ICD-10-CM | POA: Diagnosis not present

## 2021-01-29 LAB — CULTURE, BLOOD (ROUTINE X 2)
Culture: NO GROWTH
Culture: NO GROWTH

## 2021-02-01 ENCOUNTER — Ambulatory Visit
Admission: RE | Admit: 2021-02-01 | Discharge: 2021-02-01 | Disposition: A | Discharge status: Home / Self Care | Payer: 59 | Source: Ambulatory Visit | Attending: Radiation Oncology | Admitting: Radiation Oncology

## 2021-02-01 ENCOUNTER — Other Ambulatory Visit: Payer: Self-pay

## 2021-02-01 DIAGNOSIS — Z510 Encounter for antineoplastic radiation therapy: Secondary | ICD-10-CM | POA: Diagnosis not present

## 2021-02-02 ENCOUNTER — Ambulatory Visit
Admission: RE | Admit: 2021-02-02 | Discharge: 2021-02-02 | Disposition: A | Discharge status: Home / Self Care | Payer: 59 | Source: Ambulatory Visit | Attending: Radiation Oncology | Admitting: Radiation Oncology

## 2021-02-02 DIAGNOSIS — Z510 Encounter for antineoplastic radiation therapy: Secondary | ICD-10-CM | POA: Diagnosis not present

## 2021-02-03 ENCOUNTER — Encounter: Payer: Self-pay | Admitting: General Practice

## 2021-02-03 ENCOUNTER — Other Ambulatory Visit: Payer: Self-pay

## 2021-02-03 ENCOUNTER — Ambulatory Visit
Admission: RE | Admit: 2021-02-03 | Discharge: 2021-02-03 | Disposition: A | Discharge status: Home / Self Care | Payer: Medicaid Other | Source: Ambulatory Visit | Attending: Radiation Oncology | Admitting: Radiation Oncology

## 2021-02-03 DIAGNOSIS — Z510 Encounter for antineoplastic radiation therapy: Secondary | ICD-10-CM | POA: Insufficient documentation

## 2021-02-03 DIAGNOSIS — C7931 Secondary malignant neoplasm of brain: Secondary | ICD-10-CM | POA: Diagnosis present

## 2021-02-03 DIAGNOSIS — C3412 Malignant neoplasm of upper lobe, left bronchus or lung: Secondary | ICD-10-CM | POA: Diagnosis not present

## 2021-02-03 NOTE — Progress Notes (Signed)
[facility] Spiritual Care Note  Attempted pastoral follow-up call; left voicemail encouraging return call.   [place], [place], [facility] Pager [phone] Voicemail [phone]

## 2021-02-04 ENCOUNTER — Ambulatory Visit
Admission: RE | Admit: 2021-02-04 | Discharge: 2021-02-04 | Disposition: A | Discharge status: Home / Self Care | Payer: Medicaid Other | Source: Ambulatory Visit | Attending: Radiation Oncology | Admitting: Radiation Oncology

## 2021-02-04 ENCOUNTER — Other Ambulatory Visit: Payer: Self-pay

## 2021-02-04 DIAGNOSIS — Z510 Encounter for antineoplastic radiation therapy: Secondary | ICD-10-CM | POA: Diagnosis not present

## 2021-02-05 ENCOUNTER — Ambulatory Visit
Admission: RE | Admit: 2021-02-05 | Discharge: 2021-02-05 | Disposition: A | Discharge status: Home / Self Care | Payer: Medicaid Other | Source: Ambulatory Visit | Attending: Radiation Oncology | Admitting: Radiation Oncology

## 2021-02-05 DIAGNOSIS — Z510 Encounter for antineoplastic radiation therapy: Secondary | ICD-10-CM | POA: Diagnosis not present

## 2021-02-08 ENCOUNTER — Encounter: Payer: Self-pay | Admitting: Internal Medicine

## 2021-02-08 ENCOUNTER — Ambulatory Visit: Payer: 59 | Admitting: Physician Assistant

## 2021-02-08 ENCOUNTER — Other Ambulatory Visit: Payer: Self-pay

## 2021-02-08 ENCOUNTER — Other Ambulatory Visit (HOSPITAL_COMMUNITY): Payer: Self-pay

## 2021-02-08 ENCOUNTER — Inpatient Hospital Stay: Payer: Medicaid Other | Attending: Internal Medicine | Admitting: Nurse Practitioner

## 2021-02-08 ENCOUNTER — Ambulatory Visit
Admission: RE | Admit: 2021-02-08 | Discharge: 2021-02-08 | Disposition: A | Discharge status: Home / Self Care | Payer: Medicaid Other | Source: Ambulatory Visit | Attending: Radiation Oncology | Admitting: Radiation Oncology

## 2021-02-08 ENCOUNTER — Other Ambulatory Visit: Payer: 59

## 2021-02-08 ENCOUNTER — Ambulatory Visit: Payer: 59

## 2021-02-08 ENCOUNTER — Encounter: Payer: Self-pay | Admitting: Nurse Practitioner

## 2021-02-08 VITALS — BP 148/83 | HR 75 | Temp 98.2°F | Resp 19 | Ht 71.0 in | Wt 274.2 lb

## 2021-02-08 DIAGNOSIS — Z7189 Other specified counseling: Secondary | ICD-10-CM

## 2021-02-08 DIAGNOSIS — G8222 Paraplegia, incomplete: Secondary | ICD-10-CM | POA: Diagnosis not present

## 2021-02-08 DIAGNOSIS — G893 Neoplasm related pain (acute) (chronic): Secondary | ICD-10-CM

## 2021-02-08 DIAGNOSIS — R519 Headache, unspecified: Secondary | ICD-10-CM | POA: Insufficient documentation

## 2021-02-08 DIAGNOSIS — Z515 Encounter for palliative care: Secondary | ICD-10-CM | POA: Diagnosis not present

## 2021-02-08 DIAGNOSIS — F32A Depression, unspecified: Secondary | ICD-10-CM

## 2021-02-08 DIAGNOSIS — C7931 Secondary malignant neoplasm of brain: Secondary | ICD-10-CM | POA: Insufficient documentation

## 2021-02-08 DIAGNOSIS — Z5111 Encounter for antineoplastic chemotherapy: Secondary | ICD-10-CM | POA: Insufficient documentation

## 2021-02-08 DIAGNOSIS — K5900 Constipation, unspecified: Secondary | ICD-10-CM

## 2021-02-08 DIAGNOSIS — Z79899 Other long term (current) drug therapy: Secondary | ICD-10-CM | POA: Insufficient documentation

## 2021-02-08 DIAGNOSIS — F17210 Nicotine dependence, cigarettes, uncomplicated: Secondary | ICD-10-CM | POA: Insufficient documentation

## 2021-02-08 DIAGNOSIS — R630 Anorexia: Secondary | ICD-10-CM

## 2021-02-08 DIAGNOSIS — G4700 Insomnia, unspecified: Secondary | ICD-10-CM | POA: Insufficient documentation

## 2021-02-08 DIAGNOSIS — Z510 Encounter for antineoplastic radiation therapy: Secondary | ICD-10-CM | POA: Diagnosis not present

## 2021-02-08 DIAGNOSIS — C3412 Malignant neoplasm of upper lobe, left bronchus or lung: Secondary | ICD-10-CM | POA: Insufficient documentation

## 2021-02-08 DIAGNOSIS — C3492 Malignant neoplasm of unspecified part of left bronchus or lung: Secondary | ICD-10-CM

## 2021-02-08 DIAGNOSIS — R634 Abnormal weight loss: Secondary | ICD-10-CM | POA: Insufficient documentation

## 2021-02-08 DIAGNOSIS — R059 Cough, unspecified: Secondary | ICD-10-CM | POA: Insufficient documentation

## 2021-02-08 DIAGNOSIS — I10 Essential (primary) hypertension: Secondary | ICD-10-CM | POA: Insufficient documentation

## 2021-02-08 MED ORDER — OXYCODONE HCL 5 MG PO TABS
5.0000 mg | ORAL_TABLET | Freq: Four times a day (QID) | ORAL | 0 refills | Status: DC | PRN
  Filled 2021-02-08: qty 60, 15d supply, fill #0

## 2021-02-08 NOTE — Progress Notes (Signed)
[place]  [phone] Fax:(336) [phone]   Name: Raymond [name] Date: 02/08/2021 MRN: [id number]  DOB: [birth year]  Patient Care Team: Pcp, No as PCP - General Valrie Hart, RN as Oncology Nurse Navigator (Oncology)    REASON FOR CONSULTATION: Raymond [name] is a 61 y.o. male with medical history including stage IV non-small cell lung cancer, adenocarcinoma of the left lower lobe, mediastinal lymphadenopathy with brain metastasis, s/p 5 cycles of carboplatin, Keytruda, and Alimta, stereotactic radiosurgery to brain mets, hypertension, and GERD.  Recent MRI on (01/21/21) showed significant increase in enhancing metastatic cerebral and cerebellar hemisphere lesions. Now undergoing a total of 10 whole brain radiation treatments. Palliative ask to see for ongoing symptom management support and goals of care.    SOCIAL HISTORY:     reports that he has been smoking cigarettes. He has never used smokeless tobacco. He reports that he does not drink alcohol and does not use drugs.  ADVANCE DIRECTIVES:  None on file. Education provided. [name] aware his wife, Raymond [name] is his medical decision maker in the setting of no document. He was given an advanced directive packet with plans to complete on 02/15/21.   CODE STATUS: Full code  PAST MEDICAL HISTORY: Past Medical History:  Diagnosis Date   Arthritis    Baker's cyst    right   Diverticulitis    GERD (gastroesophageal reflux disease)    Heart attack ([place]) 01/04/2004   History of hiatal hernia    Hypertension    Malignant neoplasm of upper lobe of left lung ([place]) 09/24/2020   Pneumonia    as a child   Sleep apnea    no longer on Cpap    PAST SURGICAL HISTORY:  Past Surgical History:  Procedure Laterality Date   BIOPSY  01/26/2021   Procedure: BIOPSY;  Surgeon: Ronnette Juniper, MD;  Location: [facility] ENDOSCOPY;  Service: Gastroenterology;;   BRONCHIAL BIOPSY  09/14/2020   Procedure:  BRONCHIAL BIOPSIES;  Surgeon: Collene Gobble, MD;  Location: [facility] ENDOSCOPY;  Service: Cardiopulmonary;;   BRONCHIAL BRUSHINGS  09/14/2020   Procedure: BRONCHIAL BRUSHINGS;  Surgeon: Collene Gobble, MD;  Location: [place];  Service: Cardiopulmonary;;   BRONCHIAL NEEDLE ASPIRATION BIOPSY  09/14/2020   Procedure: BRONCHIAL NEEDLE ASPIRATION BIOPSIES;  Surgeon: Collene Gobble, MD;  Location: [place];  Service: Cardiopulmonary;;   CARDIAC CATHETERIZATION     CARPAL TUNNEL RELEASE     right   COLONOSCOPY WITH PROPOFOL N/A 01/26/2021   Procedure: COLONOSCOPY WITH PROPOFOL;  Surgeon: Ronnette Juniper, MD;  Location: [facility] ENDOSCOPY;  Service: Gastroenterology;  Laterality: N/A;   FIDUCIAL MARKER PLACEMENT  09/14/2020   Procedure: FIDUCIAL MARKER PLACEMENT;  Surgeon: Collene Gobble, MD;  Location: [facility] ENDOSCOPY;  Service: Cardiopulmonary;;   HAND SURGERY     left   KNEE SURGERY     left   VIDEO BRONCHOSCOPY N/A 09/14/2020   Procedure: ROBOTIC VIDEO BRONCHOSCOPY WITH FLUORO;  Surgeon: Collene Gobble, MD;  Location: [place];  Service: Cardiopulmonary;  Laterality: N/A;   VIDEO BRONCHOSCOPY WITH ENDOBRONCHIAL ULTRASOUND N/A 09/14/2020   Procedure: VIDEO BRONCHOSCOPY WITH ENDOBRONCHIAL ULTRASOUND;  Surgeon: Collene Gobble, MD;  Location: [place];  Service: Cardiopulmonary;  Laterality: N/A;   VIDEO BRONCHOSCOPY WITH RADIAL ENDOBRONCHIAL ULTRASOUND  09/14/2020   Procedure: VIDEO BRONCHOSCOPY WITH RADIAL ENDOBRONCHIAL ULTRASOUND;  Surgeon: Collene Gobble, MD;  Location: [facility] ENDOSCOPY;  Service: Cardiopulmonary;;    HEMATOLOGY/ONCOLOGY HISTORY:  Oncology  History  Malignant neoplasm of upper lobe of left lung ([place])  09/24/2020 Initial Diagnosis   Malignant neoplasm of upper lobe of left lung ([place])   09/24/2020 Cancer Staging   Staging form: Lung, AJCC 8th Edition - Clinical: Stage IIIA (cT2b, cN2, cM0) - Signed by Curt Bears, MD on 09/24/2020    10/05/2020 - 10/05/2020 Chemotherapy    Patient is on Treatment Plan : LUNG Carboplatin / Paclitaxel + XRT q7d     10/12/2020 -  Chemotherapy   Patient is on Treatment Plan : LUNG CARBOplatin / Pemetrexed / Pembrolizumab q21d Induction x 4 cycles / Maintenance Pemetrexed + Pembrolizumab     Adenocarcinoma of left lung, stage 4 ([place])  10/05/2020 Initial Diagnosis   Adenocarcinoma of left lung, stage 4 ([place])   10/12/2020 -  Chemotherapy   Patient is on Treatment Plan : LUNG CARBOplatin / Pemetrexed / Pembrolizumab q21d Induction x 4 cycles / Maintenance Pemetrexed + Pembrolizumab       ALLERGIES:  is allergic to benadryl [diphenhydramine].  MEDICATIONS:  Current Outpatient Medications  Medication Sig Dispense Refill   oxyCODONE (OXY IR/ROXICODONE) 5 MG immediate release tablet Take 1 tablet (5 mg total) by mouth every 6 (six) hours as needed for severe pain. 60 tablet 0   acetaminophen (TYLENOL) 650 MG CR tablet Take 650 mg by mouth every 8 (eight) hours as needed for pain.     amLODipine (NORVASC) 5 MG tablet Take 1 tablet (5 mg total) by mouth daily. 30 tablet 0   folic acid (FOLVITE) 1 MG tablet Take 1 tablet (1 mg total) by mouth daily. 30 tablet 1   gabapentin (NEURONTIN) 100 MG capsule Take 1 capsule (100 mg total) by mouth at bedtime for restless legs. 30 capsule 0   memantine (NAMENDA) 10 MG tablet Take 1 tablet (10 mg total) by mouth 2 (two) times daily. 60 tablet 4   memantine (NAMENDA) 5 MG tablet Begin this prescription the first day of brain radiation. Week 1: Take 1 tablet by mouth every morning. Week 2: Take 1 tablet every morning and evening. Week 3: Take 2 tablets every morning, and 1 tablet in the evening. Week 4: Take 2 tablets in the morning and evening. Fill subsequent prescription each month. 70 tablet 0   mirtazapine (REMERON) 15 MG tablet Take 1 tablet by mouth at bedtime. (Patient not taking: Reported on 01/24/2021) 30 tablet 2   Misc Natural Products (OSTEO BI-FLEX ADV JOINT SHIELD) TABS Take 1 tablet by  mouth daily.     prochlorperazine (COMPAZINE) 10 MG tablet Take 1 tablet (10 mg total) by mouth every 6 (six) hours as needed for nausea or vomiting. 30 tablet 0   simethicone (MYLICON) 856 MG chewable tablet Chew 125 mg by mouth every 6 (six) hours as needed for flatulence.     tamsulosin (FLOMAX) 0.4 MG CAPS capsule Take 1 capsule (0.4 mg total) by mouth daily after supper. 30 capsule 0   Wheat Dextrin (BENEFIBER DRINK MIX) PACK Take 1 Package by mouth daily at 6 (six) AM.     No current facility-administered medications for this visit.    VITAL SIGNS: BP (!) 148/83 (BP Location: Left Arm, Patient Position: Sitting) Comment: nurse notified   Pulse 75    Temp 98.2 F (36.8 C) (Axillary)    Resp 19    Ht 5\' 11"  (1.803 m)    Wt 274 lb 3.2 oz (124.4 kg)    SpO2 100%    BMI 38.24 kg/m  Filed Weights   02/08/21 1302  Weight: 274 lb 3.2 oz (124.4 kg)    Estimated body mass index is 38.24 kg/m as calculated from the following:   Height as of this encounter: 5\' 11"  (1.803 m).   Weight as of this encounter: 274 lb 3.2 oz (124.4 kg).  LABS: CBC:    Component Value Date/Time   WBC 12.8 (H) 01/27/2021 0357   HGB 9.4 (L) 01/27/2021 0357   HGB 11.9 (L) 01/06/2021 0849   HCT 30.0 (L) 01/27/2021 0357   PLT 279 01/27/2021 0357   PLT 201 01/06/2021 0849   MCV 98.7 01/27/2021 0357   NEUTROABS 15.9 (H) 01/24/2021 1200   LYMPHSABS 1.5 01/24/2021 1200   MONOABS 2.5 (H) 01/24/2021 1200   EOSABS 0.0 01/24/2021 1200   BASOSABS 0.1 01/24/2021 1200   Comprehensive Metabolic Panel:    Component Value Date/Time   NA 136 01/27/2021 0357   K 3.7 01/27/2021 0357   CL 103 01/27/2021 0357   CO2 26 01/27/2021 0357   BUN 9 01/27/2021 0357   CREATININE 0.66 01/27/2021 0357   CREATININE 0.90 01/06/2021 0849   GLUCOSE 113 (H) 01/27/2021 0357   CALCIUM 8.6 (L) 01/27/2021 0357   AST 16 01/25/2021 0344   AST 19 01/06/2021 0849   ALT 12 01/25/2021 0344   ALT 19 01/06/2021 0849   ALKPHOS 66 01/25/2021  0344   BILITOT 0.8 01/25/2021 0344   BILITOT 0.4 01/06/2021 0849   PROT 6.4 (L) 01/25/2021 0344   ALBUMIN 2.9 (L) 01/25/2021 0344   ALBUMIN 4.0 05/20/2019 1951    RADIOGRAPHIC STUDIES: CT Chest W Contrast  Result Date: 01/23/2021 CLINICAL DATA:  61 year old male with history of stage IV non-small cell lung cancer diagnosed in 2022. Chemotherapy in progress. Cough and shortness of breath intermittently. Follow-up study. EXAM: CT CHEST, ABDOMEN, AND PELVIS WITH CONTRAST TECHNIQUE: Multidetector CT imaging of the chest, abdomen and pelvis was performed following the standard protocol during bolus administration of intravenous contrast. RADIATION DOSE REDUCTION: This exam was performed according to the departmental dose-optimization program which includes automated exposure control, adjustment of the mA and/or kV according to patient size and/or use of iterative reconstruction technique. CONTRAST:  128mL OMNIPAQUE IOHEXOL 300 MG/ML  SOLN COMPARISON:  Multiple priors, most recently CT the chest, abdomen and pelvis 12/17/2020. FINDINGS: CT CHEST FINDINGS Cardiovascular: Heart size is normal. There is no significant pericardial fluid, thickening or pericardial calcification. No atherosclerotic calcifications are noted in the thoracic aorta or the coronary arteries. Mediastinum/Nodes: Prevascular lymph node measuring 1.2 cm in short axis (previously 1.1 cm in short axis). Fullness of soft tissues in the left hilar region, likely lymph nodes, measuring up to 1.2 cm in short axis (axial image 25 of series 2). Esophagus is unremarkable in appearance. No axillary lymphadenopathy. Lungs/Pleura: Dominant left upper lobe mass currently measures 5.8 x 4.4 x 4.5 cm (axial image 56 of series 6 and coronal image 95 of series 4) as compared with 6.0 x 3.7 x 4.6 cm on the prior study. A fiducial marker is again noted within this lesion. Numerous satellite nodules are again noted adjacent to the lesion and elsewhere throughout  the left upper lobe, largest of which measures 8 mm (axial image 70 of series 6), increased from 6 mm on the prior study. In addition, there are multiple other small pulmonary nodules scattered throughout the lungs bilaterally indicative of widespread hematogenous metastases. These appear similar in number, but generally slightly increased in size compared to the prior  examination. An example of this is a 8 mm nodule in the medial aspect of the right lower lobe (axial image 99 of series 6) which previously measured only 6 mm. No acute consolidative airspace disease. No pleural effusions. Musculoskeletal: There are no aggressive appearing lytic or blastic lesions noted in the visualized portions of the skeleton. CT ABDOMEN PELVIS FINDINGS Hepatobiliary: No suspicious cystic or solid hepatic lesions. No intra or extrahepatic biliary ductal dilatation. Multiple noncalcified gallstones are noted within the lumen of the gallbladder which appears compressed around the indwelling gallstones. No pericholecystic fluid or surrounding inflammatory changes to suggest an acute cholecystitis at this time. Pancreas: No pancreatic mass. No pancreatic ductal dilatation. No pancreatic or peripancreatic fluid collections or inflammatory changes. Spleen: Unremarkable. Adrenals/Urinary Tract: 2 mm nonobstructive calculus in the interpolar region of the left kidney. Right kidney and right adrenal gland are normal in appearance. There is again a slightly nodular contour of the left adrenal gland, stable compared to prior studies, previously not hypermetabolic on PET-CT, presumably benign. No hydroureteronephrosis. Urinary bladder is nearly decompressed, but otherwise unremarkable in appearance. Stomach/Bowel: The appearance of the stomach is normal. There is no pathologic dilatation of small bowel or colon. There is again mass-like mural thickening involving the distal descending colon and proximal sigmoid colon, best appreciated on axial  images 87-99 of series 2, where there is also extensive soft tissue stranding in the adjacent pericolonic fat which has been chronic over several prior examinations, concerning for probable neoplasm with local infiltration of soft tissues. Normal appendix. Vascular/Lymphatic: Aortic atherosclerosis. No aneurysm or dissection noted in the abdominal or pelvic vasculature. No lymphadenopathy noted in the abdomen or pelvis. Reproductive: Prostate gland and seminal vesicles are unremarkable in appearance. Other: No significant volume of ascites.  No pneumoperitoneum. Musculoskeletal: There are no aggressive appearing lytic or blastic lesions noted in the visualized portions of the skeleton. IMPRESSION: 1. Today's study again demonstrates progressive disease. Although the previously noted left upper lobe mass is similar in size, there is enlargement of numerous surrounding satellite nodules and numerous hematogenous metastases throughout the lungs, as above. Slight enlargement of left hilar lymph node and prevascular lymph node also noted. 2. No definite signs of metastatic disease in the abdomen or pelvis. However, there is persistent mural thickening, luminal narrowing and surrounding inflammatory changes in the colon involving the distal descending colon and proximal sigmoid colon, which given the chronicity of this finding is highly concerning for primary colonic neoplasm. 3. Cholelithiasis. 4. 2 mm nonobstructive calculus in the interpolar collecting system of the left kidney. 5. Aortic atherosclerosis. 6. Additional incidental findings, as above. Electronically Signed   By: Vinnie Langton M.D.   On: 01/23/2021 07:36   CT Angio Chest PE W and/or Wo Contrast  Result Date: 01/24/2021 CLINICAL DATA:  61 year old male with stage IV non-small cell lung cancer with diffuse chest and abdominal pain with cough and intermittent shortness of breath. Chemotherapy in progress. EXAM: CT ANGIOGRAPHY CHEST CT ABDOMEN AND  PELVIS WITH CONTRAST TECHNIQUE: Multidetector CT imaging of the chest was performed using the standard protocol during bolus administration of intravenous contrast. Multiplanar CT image reconstructions and MIPs were obtained to evaluate the vascular anatomy. Multidetector CT imaging of the abdomen and pelvis was performed using the standard protocol during bolus administration of intravenous contrast. RADIATION DOSE REDUCTION: This exam was performed according to the departmental dose-optimization program which includes automated exposure control, adjustment of the mA and/or kV according to patient size and/or use of iterative reconstruction  technique. CONTRAST:  162mL OMNIPAQUE IOHEXOL 350 MG/ML SOLN COMPARISON:  01/22/2021 prior CTs FINDINGS: CTA CHEST FINDINGS Cardiovascular: This is a technically borderline study due to respiratory motion artifact and less than optimal contrast opacification of the pulmonary arteries. No definite pulmonary emboli are identified. UPPER limits normal heart size noted. There is no evidence of thoracic aortic aneurysm. No pericardial effusion. Mediastinum/Nodes: Enlarged mediastinal and LEFT hilar lymph nodes are unchanged with index nodes as follows: A 1.2 cm prevascular node (series 1: Image 25) A 1.2 cm LEFT hilar node (1:32). No new or enlarging lymph nodes are identified since 01/22/2021. The visualized thyroid gland, trachea and esophagus are unremarkable. Lungs/Pleura: Unchanged dominant LEFT UPPER lobe mass again measures 5.8 x 4.4 x 4.5 cm (8:44) and contains a fiducial marker. Numerous adjacent satellite lesions/nodules are again identified. Numerous diffuse bilateral pulmonary nodules are unchanged from the prior study with the an index 8 mm LEFT UPPER lobe nodule (8:60). Mild dependent/basilar atelectasis is noted. No new pulmonary opacities are identified. No pleural effusion or pneumothorax noted. Musculoskeletal: No acute or suspicious bony lesions are identified.  Review of the MIP images confirms the above findings. CT ABDOMEN and PELVIS FINDINGS Hepatobiliary: The liver is unremarkable. Cholelithiasis noted with possible mild gallbladder wall thickening, but without definite pericholecystic inflammation. No biliary dilatation. Pancreas: Unremarkable Spleen: Unremarkable Adrenals/Urinary Tract: The kidneys, adrenal glands and bladder are unremarkable except for a punctate nonobstructing LEFT renal calculus. Stomach/Bowel: Circumferential wall thickening of the proximal sigmoid colon with mild adjacent stranding is unchanged. There is no evidence of bowel obstruction or new bowel wall thickening. There is no evidence of appendicitis. Vascular/Lymphatic: Aortic atherosclerosis. No enlarged abdominal or pelvic lymph nodes. Reproductive: Unremarkable Other: No ascites, focal collection or pneumoperitoneum. Musculoskeletal: No acute findings noted. Review of the MIP images confirms the above findings. IMPRESSION: 1. No evidence of acute abnormality within the chest, abdomen or pelvis. No evidence of pulmonary emboli, but sensitivity is decreased due to technical factors as described above. 2. Cholelithiasis with possible mild gallbladder wall thickening, but without definite pericholecystic inflammation. Consider further evaluation with ultrasound if there is strong clinical suspicion for acute cholecystitis. 3. Unchanged dominant LEFT UPPER lobe malignancy, diffuse bilateral pulmonary metastatic disease/nodules and mediastinal and LEFT hilar lymphadenopathy. 4. Unchanged circumferential wall thickening of the proximal sigmoid colon with mild adjacent stranding, which may represent malignancy or chronic inflammation/infection. 5. LEFT nephrolithiasis 6. Aortic Atherosclerosis (ICD10-I70.0). Electronically Signed   By: Margarette Canada M.D.   On: 01/24/2021 14:17   MR Brain W Wo Contrast  Result Date: 01/22/2021 CLINICAL DATA:  Brain metastases EXAM: MRI HEAD WITHOUT AND WITH  CONTRAST TECHNIQUE: Multiplanar, multiecho pulse sequences of the brain and surrounding structures were obtained without and with intravenous contrast. CONTRAST:  65mL GADAVIST GADOBUTROL 1 MMOL/ML IV SOLN COMPARISON:  Brain MRI 10/08/2020 FINDINGS: Brain: There are numerous (28 individual lesions identified, 14 on the prior study) enhancing intracranial lesions in the bilateral cerebral and cerebellar hemispheres. Many of the lesions are new since 10/08/2020. Examples of new lesions include: *3 mm lesion in the right frontoparietal region cortex at the midline (1200-253). *3 mm lesion in the left superior frontal gyrus at the midline (1610-960). *Two 3 mm enhancing lesions in the left superior frontal gyrus near the midline (1200-220). *Punctate lesion in the right parietal lobe (4540-981). *2 punctate lesions in the right frontal lobe anterolaterally (1200-213). *4 mm lesion in the right frontal operculum (1200-192). *4 mm lesion in the right temporal lobe (1200-191). *4  mm lesion in the left temporal lobe (1200-191). *3 mm lesion in the left caudate head (1200-183). Some of the lesions demonstrate SWI signal dropout consistent with hemorrhage. There is mild edema in the right parietal lobe, increased since the prior study. There is no other significant perilesional edema Background parenchymal volume is normal. There is no evidence of acute intracranial hemorrhage, extra-axial fluid collection, or infarct. There is no midline shift. Vascular: Normal flow voids. Skull and upper cervical spine: No suspicious osseous lesions are seen. Sinuses/Orbits: There is mild mucosal thickening in the paranasal sinuses. The globes and orbits are unremarkable. Other: There is a right mastoid effusion, new since the prior study. IMPRESSION: 1. Significant increase in number of subcentimeter enhancing metastatic lesions in both cerebral and cerebellar hemispheres, with at least 28 identified on the current study, previously 14. 2.  Slightly increased edema in the right parietal lobe. No other significant perilesional edema. 3. New right mastoid effusion. Electronically Signed   By: Valetta Mole M.D.   On: 01/22/2021 09:27   CT ABDOMEN PELVIS W CONTRAST  Result Date: 01/24/2021 CLINICAL DATA:  61 year old male with stage IV non-small cell lung cancer with diffuse chest and abdominal pain with cough and intermittent shortness of breath. Chemotherapy in progress. EXAM: CT ANGIOGRAPHY CHEST CT ABDOMEN AND PELVIS WITH CONTRAST TECHNIQUE: Multidetector CT imaging of the chest was performed using the standard protocol during bolus administration of intravenous contrast. Multiplanar CT image reconstructions and MIPs were obtained to evaluate the vascular anatomy. Multidetector CT imaging of the abdomen and pelvis was performed using the standard protocol during bolus administration of intravenous contrast. RADIATION DOSE REDUCTION: This exam was performed according to the departmental dose-optimization program which includes automated exposure control, adjustment of the mA and/or kV according to patient size and/or use of iterative reconstruction technique. CONTRAST:  153mL OMNIPAQUE IOHEXOL 350 MG/ML SOLN COMPARISON:  01/22/2021 prior CTs FINDINGS: CTA CHEST FINDINGS Cardiovascular: This is a technically borderline study due to respiratory motion artifact and less than optimal contrast opacification of the pulmonary arteries. No definite pulmonary emboli are identified. UPPER limits normal heart size noted. There is no evidence of thoracic aortic aneurysm. No pericardial effusion. Mediastinum/Nodes: Enlarged mediastinal and LEFT hilar lymph nodes are unchanged with index nodes as follows: A 1.2 cm prevascular node (series 1: Image 25) A 1.2 cm LEFT hilar node (1:32). No new or enlarging lymph nodes are identified since 01/22/2021. The visualized thyroid gland, trachea and esophagus are unremarkable. Lungs/Pleura: Unchanged dominant LEFT UPPER  lobe mass again measures 5.8 x 4.4 x 4.5 cm (8:44) and contains a fiducial marker. Numerous adjacent satellite lesions/nodules are again identified. Numerous diffuse bilateral pulmonary nodules are unchanged from the prior study with the an index 8 mm LEFT UPPER lobe nodule (8:60). Mild dependent/basilar atelectasis is noted. No new pulmonary opacities are identified. No pleural effusion or pneumothorax noted. Musculoskeletal: No acute or suspicious bony lesions are identified. Review of the MIP images confirms the above findings. CT ABDOMEN and PELVIS FINDINGS Hepatobiliary: The liver is unremarkable. Cholelithiasis noted with possible mild gallbladder wall thickening, but without definite pericholecystic inflammation. No biliary dilatation. Pancreas: Unremarkable Spleen: Unremarkable Adrenals/Urinary Tract: The kidneys, adrenal glands and bladder are unremarkable except for a punctate nonobstructing LEFT renal calculus. Stomach/Bowel: Circumferential wall thickening of the proximal sigmoid colon with mild adjacent stranding is unchanged. There is no evidence of bowel obstruction or new bowel wall thickening. There is no evidence of appendicitis. Vascular/Lymphatic: Aortic atherosclerosis. No enlarged abdominal or pelvic lymph  nodes. Reproductive: Unremarkable Other: No ascites, focal collection or pneumoperitoneum. Musculoskeletal: No acute findings noted. Review of the MIP images confirms the above findings. IMPRESSION: 1. No evidence of acute abnormality within the chest, abdomen or pelvis. No evidence of pulmonary emboli, but sensitivity is decreased due to technical factors as described above. 2. Cholelithiasis with possible mild gallbladder wall thickening, but without definite pericholecystic inflammation. Consider further evaluation with ultrasound if there is strong clinical suspicion for acute cholecystitis. 3. Unchanged dominant LEFT UPPER lobe malignancy, diffuse bilateral pulmonary metastatic  disease/nodules and mediastinal and LEFT hilar lymphadenopathy. 4. Unchanged circumferential wall thickening of the proximal sigmoid colon with mild adjacent stranding, which may represent malignancy or chronic inflammation/infection. 5. LEFT nephrolithiasis 6. Aortic Atherosclerosis (ICD10-I70.0). Electronically Signed   By: Margarette Canada M.D.   On: 01/24/2021 14:17   CT Abdomen Pelvis W Contrast  Result Date: 01/23/2021 CLINICAL DATA:  61 year old male with history of stage IV non-small cell lung cancer diagnosed in 2022. Chemotherapy in progress. Cough and shortness of breath intermittently. Follow-up study. EXAM: CT CHEST, ABDOMEN, AND PELVIS WITH CONTRAST TECHNIQUE: Multidetector CT imaging of the chest, abdomen and pelvis was performed following the standard protocol during bolus administration of intravenous contrast. RADIATION DOSE REDUCTION: This exam was performed according to the departmental dose-optimization program which includes automated exposure control, adjustment of the mA and/or kV according to patient size and/or use of iterative reconstruction technique. CONTRAST:  128mL OMNIPAQUE IOHEXOL 300 MG/ML  SOLN COMPARISON:  Multiple priors, most recently CT the chest, abdomen and pelvis 12/17/2020. FINDINGS: CT CHEST FINDINGS Cardiovascular: Heart size is normal. There is no significant pericardial fluid, thickening or pericardial calcification. No atherosclerotic calcifications are noted in the thoracic aorta or the coronary arteries. Mediastinum/Nodes: Prevascular lymph node measuring 1.2 cm in short axis (previously 1.1 cm in short axis). Fullness of soft tissues in the left hilar region, likely lymph nodes, measuring up to 1.2 cm in short axis (axial image 25 of series 2). Esophagus is unremarkable in appearance. No axillary lymphadenopathy. Lungs/Pleura: Dominant left upper lobe mass currently measures 5.8 x 4.4 x 4.5 cm (axial image 56 of series 6 and coronal image 95 of series 4) as compared  with 6.0 x 3.7 x 4.6 cm on the prior study. A fiducial marker is again noted within this lesion. Numerous satellite nodules are again noted adjacent to the lesion and elsewhere throughout the left upper lobe, largest of which measures 8 mm (axial image 70 of series 6), increased from 6 mm on the prior study. In addition, there are multiple other small pulmonary nodules scattered throughout the lungs bilaterally indicative of widespread hematogenous metastases. These appear similar in number, but generally slightly increased in size compared to the prior examination. An example of this is a 8 mm nodule in the medial aspect of the right lower lobe (axial image 99 of series 6) which previously measured only 6 mm. No acute consolidative airspace disease. No pleural effusions. Musculoskeletal: There are no aggressive appearing lytic or blastic lesions noted in the visualized portions of the skeleton. CT ABDOMEN PELVIS FINDINGS Hepatobiliary: No suspicious cystic or solid hepatic lesions. No intra or extrahepatic biliary ductal dilatation. Multiple noncalcified gallstones are noted within the lumen of the gallbladder which appears compressed around the indwelling gallstones. No pericholecystic fluid or surrounding inflammatory changes to suggest an acute cholecystitis at this time. Pancreas: No pancreatic mass. No pancreatic ductal dilatation. No pancreatic or peripancreatic fluid collections or inflammatory changes. Spleen: Unremarkable. Adrenals/Urinary Tract: 2  mm nonobstructive calculus in the interpolar region of the left kidney. Right kidney and right adrenal gland are normal in appearance. There is again a slightly nodular contour of the left adrenal gland, stable compared to prior studies, previously not hypermetabolic on PET-CT, presumably benign. No hydroureteronephrosis. Urinary bladder is nearly decompressed, but otherwise unremarkable in appearance. Stomach/Bowel: The appearance of the stomach is normal.  There is no pathologic dilatation of small bowel or colon. There is again mass-like mural thickening involving the distal descending colon and proximal sigmoid colon, best appreciated on axial images 87-99 of series 2, where there is also extensive soft tissue stranding in the adjacent pericolonic fat which has been chronic over several prior examinations, concerning for probable neoplasm with local infiltration of soft tissues. Normal appendix. Vascular/Lymphatic: Aortic atherosclerosis. No aneurysm or dissection noted in the abdominal or pelvic vasculature. No lymphadenopathy noted in the abdomen or pelvis. Reproductive: Prostate gland and seminal vesicles are unremarkable in appearance. Other: No significant volume of ascites.  No pneumoperitoneum. Musculoskeletal: There are no aggressive appearing lytic or blastic lesions noted in the visualized portions of the skeleton. IMPRESSION: 1. Today's study again demonstrates progressive disease. Although the previously noted left upper lobe mass is similar in size, there is enlargement of numerous surrounding satellite nodules and numerous hematogenous metastases throughout the lungs, as above. Slight enlargement of left hilar lymph node and prevascular lymph node also noted. 2. No definite signs of metastatic disease in the abdomen or pelvis. However, there is persistent mural thickening, luminal narrowing and surrounding inflammatory changes in the colon involving the distal descending colon and proximal sigmoid colon, which given the chronicity of this finding is highly concerning for primary colonic neoplasm. 3. Cholelithiasis. 4. 2 mm nonobstructive calculus in the interpolar collecting system of the left kidney. 5. Aortic atherosclerosis. 6. Additional incidental findings, as above. Electronically Signed   By: Vinnie Langton M.D.   On: 01/23/2021 07:36   DG Chest Port 1 View  Result Date: 01/24/2021 CLINICAL DATA:  Sepsis EXAM: PORTABLE CHEST 1 VIEW  COMPARISON:  Chest x-ray 09/14/2020, CT chest 01/22/2021 FINDINGS: Heart size and mediastinum are stable and within normal limits. No new consolidation or infiltrates identified in the lungs. 4.4 x 4.2 cm left upper lobe mass again visualized. No pleural effusion or pneumothorax. IMPRESSION: No acute process identified.  Left upper lobe mass. Electronically Signed   By: Ofilia Neas M.D.   On: 01/24/2021 12:41   US Abdomen Limited RUQ (LIVER/GB)  Result Date: 01/24/2021 CLINICAL DATA:  Right upper quadrant pain EXAM: ULTRASOUND ABDOMEN LIMITED RIGHT UPPER QUADRANT COMPARISON:  None. FINDINGS: Gallbladder: Several echogenic shadowing calculi. Evidence of wall adenomyomatosis. No significant wall thickening or surrounding fluid. No sonographic Percell Miller sign reported. Common bile duct: Diameter: 5 mm Liver: No focal lesion identified. Within normal limits in parenchymal echogenicity. Portal vein is patent on color Doppler imaging with normal direction of blood flow towards the liver. Other: None. IMPRESSION: Cholelithiasis and evidence of gallbladder adenomyomatosis. Electronically Signed   By: Ofilia Neas M.D.   On: 01/24/2021 16:09    PERFORMANCE STATUS (ECOG) : 2 - Symptomatic, <50% confined to bed  Review of Systems  Constitutional:  Positive for appetite change and unexpected weight change.  Gastrointestinal:  Positive for constipation.  Musculoskeletal:  Positive for arthralgias.  Neurological:  Positive for weakness.  Unless otherwise noted, a complete review of systems is negative.  Physical Exam General: NAD, in a wheelchair  Cardiovascular: regular rate and rhythm Pulmonary: clear  ant fields Abdomen: soft, nontender, + bowel sounds Extremities: no edema, left leg weakness  Skin: no rashes Neurological: Weakness but otherwise nonfocal  IMPRESSION: This is my initial visit with Mr. Mejorado. He was seen by my Palliative colleague during his recent hospitalization. He comes to his  appointment alone today. Reports he was getting off of work.   I introduced myself, Advertising copywriter, and Palliative's role in collaboration with the oncology team. Concept of Palliative Care was re-introduced as specialized medical care for people and their families living with serious illness.  It focuses on providing relief from the symptoms and stress of a serious illness.  The goal is to improve quality of life for both the patient and the family. Values and goals of care important to patient and family were attempted to be elicited.   Mr. Lepera lives in the home with his wife. 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 He currently is still actively working despite treatments.   He shares his job has denied him disability and although he does not feel up to being at work he has no other options and must push his way through to support his family. He sits most of the day with some movement required. Has weakness in his left lower extremity using his pants leg to reposition.   Pain related to neoplasm  Mr. Scow complains of ongoing bilateral leg pain and some generalized aches and pain. During hospitalization he was started on Oxy IR which he feels was most helpful. Discussed continuing as needed with close evaluation of use and effectiveness. He is also taking gabapentin 100 mg at night. We discussed increasing nightly dose.   Constipation  Patient reports he had a bowel movement today which has been the first since last Tuesday. We discussed the importance of bowel regimen to prevent further complications and in addition to the use of opioids. He reports he previously took Stage manager for support. Encouraged daily use of Miralax with understanding if no bowel movement in 24 hours to increase to twice daily. Encouraged water intake.   Weight loss/decreased appetite  Eyal endorses ongoing weight loss. Shares he has loss approximately 25-30lbs over the past 6 months. His appetite is minimal. All  foods and drinks have a metallic taste which impacts his desire for most food. Education provided on protein shakes such as ensure, protein enriched foods, and trying different foods such as cold vs hot. Also encouraged small frequent meals compared to large meals. He states he went [place] with his wife and unfortunately was unable to eat majority of his food but did enjoy bread and applesauce.   4. Goals of Care    We discussed Her current illness and what it means in the larger context of Her on-going co-morbidities. Natural disease trajectory and expectations were discussed.  Mr. Dobler becomes tearful expressing his challenges with being sick. He has a strong [place] faith which he is relying on but also speaks to frustration of not being as active and independent as he once was. He expresses feelings about new brain lesions and alterations in his health. Emotional support provided.  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 His wife relies on him and he is unable to perform or provide the way that he once was. He is relying on God's guidance and support for peace and comfort as  he is extremely overwhelmed emotionally. He also speaks to the challenges on his job. He has completed and returned his disability paperwork and hopeful he will be approved and have some stress removed as he attempts to try to work. Support provide.   I introduced Dr. Michail Sermon and his supportive serviced. Thurman verbalized understanding and expressed interest in ongoing support. Education provided on the referral process.   [place] is remaining hopeful with realistic understanding of his condition and severity.   I approached discussions regarding advance directives and wishes. He plans to think about his wishes and discuss with his wife. Jarrett Soho, RN provided with AD packet and arranging for completion on Monday 2/13 as  requested.   I discussed the importance of continued conversation with family and their medical providers regarding overall plan of care and treatment options, ensuring decisions are within the context of the patients values and GOCs.  PLAN: Advanced Directive education.  Oxy IR 5 mg every 6 hours as needed for pain.  Neurontin 100 mg nightly, will continue to evaluate and increase dose for added support.  Referral made to Dr. Nelida Meuse for ongoing support.  Quad cane ordered for home delivery to assist with mobility and gait.  Ongoing goals of care discussions.  Advance directive packet provided with plans to complete on Monday 2/13.  I will plan to see patient back in 1-2 weeks in collaboration to other oncology appointments.    Patient expressed understanding and was in agreement with this plan. He also understands that He can call the clinic at any time with any questions, concerns, or complaints.   Time Total: 65 min   Visit consisted of counseling and education dealing with the complex and emotionally intense issues of symptom management and palliative care in the setting of serious and potentially life-threatening illness.Greater than 50%  of this time was spent counseling and coordinating care related to the above assessment and plan.  Signed by: [name], AGPCNP-BC Palliative Medicine Team

## 2021-02-09 ENCOUNTER — Encounter: Payer: Self-pay | Admitting: Internal Medicine

## 2021-02-09 ENCOUNTER — Ambulatory Visit
Admission: RE | Admit: 2021-02-09 | Discharge: 2021-02-09 | Disposition: A | Discharge status: Home / Self Care | Payer: Medicaid Other | Source: Ambulatory Visit | Attending: Radiation Oncology | Admitting: Radiation Oncology

## 2021-02-09 DIAGNOSIS — Z510 Encounter for antineoplastic radiation therapy: Secondary | ICD-10-CM | POA: Diagnosis not present

## 2021-02-09 NOTE — Progress Notes (Signed)
Placed order for a [employer] through [employer] to [employer].

## 2021-02-09 NOTE — Progress Notes (Signed)
°                                                                                                                                                          °  Patient Name: Raymond [name] MRN: [id number] DOB: 1960/07/30 Referring Physician: Benito Mccreedy (Profile Not Attached) Date of Service: 02/10/2021 [name], [place]                                                        End Of Treatment Note  Diagnoses: C79.31-Secondary malignant neoplasm of brain  Cancer Staging: Stage IV, [id number], NSCLC, adenocarcinoma of the left lower lobe  Intent: Palliative  Radiation Treatment Dates: 10/21/2020 through 02/10/2021 Site Technique Total Dose (Gy) Dose per Fx (Gy) Completed Fx Beam Energies  Brain: Brain IMRT 20/20 20 1/1 6XFFF  Brain: Brain Complex 30/30 3 10/10 6X   Narrative: The patient tolerated radiation therapy relatively well. He developed fatigue and also noted some mild headaches during therapy.  Plan: The patient will receive a call in about one month from the radiation oncology department and will be followed in the brain and spine oncology conference. He will continue follow up with Dr. Julien Nordmann as well.   ________________________________________________    Carola Rhine, [facility]

## 2021-02-10 ENCOUNTER — Ambulatory Visit
Admission: RE | Admit: 2021-02-10 | Discharge: 2021-02-10 | Disposition: A | Discharge status: Home / Self Care | Payer: Medicaid Other | Source: Ambulatory Visit | Attending: Radiation Oncology | Admitting: Radiation Oncology

## 2021-02-10 ENCOUNTER — Encounter: Payer: Self-pay | Admitting: *Deleted

## 2021-02-10 ENCOUNTER — Encounter: Payer: Self-pay | Admitting: Radiation Oncology

## 2021-02-10 ENCOUNTER — Other Ambulatory Visit: Payer: Self-pay

## 2021-02-10 DIAGNOSIS — Z510 Encounter for antineoplastic radiation therapy: Secondary | ICD-10-CM | POA: Diagnosis not present

## 2021-02-10 NOTE — Progress Notes (Signed)
[facility] Social Work Progress Notes  Social Work Intern spoke to Mr. Vise per request from [employer] from [employer], who has tried to reach him re their mentorship program. Pt reports he is still interested in the program, and intern gave him info to contact [place] directly to register Linton Flemings [phone]).  Intern informed patient of the support team and support services at [facility], and left a folder of info about upcoming programs for him at the front desk. Pt reports he has been referred to [facility] and is waiting for assistance with applying for disability, after being denied through his work.   Intern will try to see patient at his next infusion appointment on 2/13 to check in on progress with disability application, mentorship program, and assess for further needs. Intern provided contact information and encouraged patient to call with any questions or concerns.  Rosary Lively, Social Work Intern Supervised by Gwinda Maine, LCSW

## 2021-02-12 NOTE — Progress Notes (Signed)
[place] OFFICE PROGRESS NOTE  Pcp, No No address on file  DIAGNOSIS: Stage IV (T2b, N2, M1c) non-small cell lung cancer, adenocarcinoma presented with left upper lobe lung mass in addition to left hilar and mediastinal lymphadenopathy diagnosed in September 2022. He also has metastatic disease to the brain.    Molecular Studies: No actionable mutations   PRIOR THERAPY: 1) SRS to the metastatic brain lesions under the care of Dr. Lisbeth Renshaw Scheduled for 10/21/20 2) Whole brain radiation under the care of Dr. Lisbeth Renshaw. Completed on 02/10/21.   CURRENT THERAPY: Carboplatin for an AUC of 5, Alimta 500 mg per metered squared, Keytruda 200 mg IV every 3 weeks. First dose on 10/12/20. Status post 5 cycles.   INTERVAL HISTORY: Raymond [name] 61 y.o. male returns to the clinic today for a follow-up visit.  The patient was last seen in clinic by Dr. Julien Nordmann on 01/06/2021.  Unfortunately in the interval, patient was found to have significant increase in the number of subcentimeter enhancing metastatic lesions in both the cerebral and cerebellar hemispheres with at least 28 newly identified lesions.  He therefore underwent whole brain radiation under the care of Dr. Lisbeth Renshaw which was completed on 02/11/2021. He has noticed increased memory fog since completing brain radiation as well as some balance changes. He recently received a cane. He also noted some increased blurry vision. His symptoms are improving though.   Additionally in the interval since his last appointment, he presented to the emergency room 01/23/2021 with diffuse abdominal pain with cramping, left chest and arm pain, diarrhea, productive cough, and shortness of breath. He had a restaging CT scan a few days prior to this admission but was unable to follow up with [place] due to being in the hospital. The patient had a CT of the abdomen and pelvis in the hospital which showed circumferential wall thickening in the proximal sigmoid colon with  adjacent stranding concerning for malignancy versus chronic inflammation versus infection.  The patient was admitted and treated with antibiotics for colitis.  He is also status post colonoscopy.  Colonoscopy showed poor prep, diverticulosis, abnormal mucosa in the sigmoid colon and distal colon which was biopsied.  The pathology results showed tubular adenoma and hyperplastic polyp.  There is no evidence of high-grade dysplasia or malignancy identified.  They recommended repeat colonoscopy in 3 months.   Since being in the hospital, his abdominal symptoms have improved. He denies any abdominal pain at this time, diarrhea, or blood in the stool. He has lost a lot of weight recently. He weighted 287 on 01/06/21 and weighs 268 lbs today. He does not drink any supplemental drinks but he recently was talking to his wife about getting some supplemental drinks. I previously prescribed him remeron for his depression, insomnia, and poor appetite. He still takes this.   The patient also follows closely with palliative care. They have referred him to Dr. Michail Sermon from psychiatry who he is expected to see this month.   Overall, the patient is feeling fairly well today. He denies any fever, chills, or night sweats.  Denies any chest pain.  Weight loss. Not good appetite. Nopt doing supplemental drinks but wife weill get. He states his breathing is "ok". He reports dyspnea on exertion, especially if walking up steps. He has a mild cough and is wondering if he is allowed to use over the counter products for chest congestion. He states he was given mucinex in the hospital. He denies chest pain or hemoptysis.  He has mild nausea but it is controlled with his anti-emetic. He denies vomiting. He has a mild dry itching rash on his back. The patient is here for evaluation and to review his scan before starting his next cycle of treatment.       MEDICAL HISTORY: Past Medical History:  Diagnosis Date   Arthritis    Baker's  cyst    right   Diverticulitis    GERD (gastroesophageal reflux disease)    Heart attack ([place]) 01/04/2004   History of hiatal hernia    Hypertension    Malignant neoplasm of upper lobe of left lung ([place]) 09/24/2020   Pneumonia    as a child   Sleep apnea    no longer on Cpap    ALLERGIES:  is allergic to benadryl [diphenhydramine].  MEDICATIONS:  Current Outpatient Medications  Medication Sig Dispense Refill   acetaminophen (TYLENOL) 650 MG CR tablet Take 650 mg by mouth every 8 (eight) hours as needed for pain.     amLODipine (NORVASC) 5 MG tablet Take 1 tablet (5 mg total) by mouth daily. 30 tablet 0   dexamethasone (DECADRON) 4 MG tablet Please take TWO tablets TWICE a day the day before, the day of, and the day after treatment. 40 tablet 2   folic acid (FOLVITE) 1 MG tablet Take 1 tablet (1 mg total) by mouth daily. 30 tablet 1   gabapentin (NEURONTIN) 100 MG capsule Take 1 capsule (100 mg total) by mouth at bedtime for restless legs. 30 capsule 0   memantine (NAMENDA) 10 MG tablet Take 1 tablet (10 mg total) by mouth 2 (two) times daily. 60 tablet 4   memantine (NAMENDA) 5 MG tablet Begin this prescription the first day of brain radiation. Week 1: Take 1 tablet by mouth every morning. Week 2: Take 1 tablet every morning and evening. Week 3: Take 2 tablets every morning, and 1 tablet in the evening. Week 4: Take 2 tablets in the morning and evening. Fill subsequent prescription each month. 70 tablet 0   mirtazapine (REMERON) 15 MG tablet Take 1 tablet by mouth at bedtime. 30 tablet 2   Misc Natural Products (OSTEO BI-FLEX ADV JOINT SHIELD) TABS Take 1 tablet by mouth daily.     oxyCODONE (OXY IR/ROXICODONE) 5 MG immediate release tablet Take 1 tablet (5 mg total) by mouth every 6 (six) hours as needed for severe pain. 60 tablet 0   prochlorperazine (COMPAZINE) 10 MG tablet Take 1 tablet (10 mg total) by mouth every 6 (six) hours as needed for nausea or vomiting. 30 tablet 0    simethicone (MYLICON) 662 MG chewable tablet Chew 125 mg by mouth every 6 (six) hours as needed for flatulence.     tamsulosin (FLOMAX) 0.4 MG CAPS capsule Take 1 capsule (0.4 mg total) by mouth daily after supper. 30 capsule 0   Wheat Dextrin (BENEFIBER DRINK MIX) PACK Take 1 Package by mouth daily at 6 (six) AM.     No current facility-administered medications for this visit.    SURGICAL HISTORY:  Past Surgical History:  Procedure Laterality Date   BIOPSY  01/26/2021   Procedure: BIOPSY;  Surgeon: Ronnette Juniper, MD;  Location: [facility];  Service: Gastroenterology;;   BRONCHIAL BIOPSY  09/14/2020   Procedure: BRONCHIAL BIOPSIES;  Surgeon: Collene Gobble, MD;  Location: [facility];  Service: Cardiopulmonary;;   BRONCHIAL BRUSHINGS  09/14/2020   Procedure: BRONCHIAL BRUSHINGS;  Surgeon: Collene Gobble, MD;  Location: [facility];  Service:  Cardiopulmonary;;   BRONCHIAL NEEDLE ASPIRATION BIOPSY  09/14/2020   Procedure: BRONCHIAL NEEDLE ASPIRATION BIOPSIES;  Surgeon: Collene Gobble, MD;  Location: [facility];  Service: Cardiopulmonary;;   CARDIAC CATHETERIZATION     CARPAL TUNNEL RELEASE     right   COLONOSCOPY WITH PROPOFOL N/A 01/26/2021   Procedure: COLONOSCOPY WITH PROPOFOL;  Surgeon: Ronnette Juniper, MD;  Location: [facility];  Service: Gastroenterology;  Laterality: N/A;   FIDUCIAL MARKER PLACEMENT  09/14/2020   Procedure: FIDUCIAL MARKER PLACEMENT;  Surgeon: Collene Gobble, MD;  Location: [facility];  Service: Cardiopulmonary;;   HAND SURGERY     left   KNEE SURGERY     left   VIDEO BRONCHOSCOPY N/A 09/14/2020   Procedure: ROBOTIC VIDEO BRONCHOSCOPY WITH FLUORO;  Surgeon: Collene Gobble, MD;  Location: [facility];  Service: Cardiopulmonary;  Laterality: N/A;   VIDEO BRONCHOSCOPY WITH ENDOBRONCHIAL ULTRASOUND N/A 09/14/2020   Procedure: VIDEO BRONCHOSCOPY WITH ENDOBRONCHIAL ULTRASOUND;  Surgeon: Collene Gobble, MD;  Location: [place];  Service: Cardiopulmonary;   Laterality: N/A;   VIDEO BRONCHOSCOPY WITH RADIAL ENDOBRONCHIAL ULTRASOUND  09/14/2020   Procedure: VIDEO BRONCHOSCOPY WITH RADIAL ENDOBRONCHIAL ULTRASOUND;  Surgeon: Collene Gobble, MD;  Location: [facility];  Service: Cardiopulmonary;;    REVIEW OF SYSTEMS:   Review of Systems  Constitutional: Positive for fatigue, decreased appetite, and weight loss. Negative for chills and fever.  HENT:  Negative for mouth sores, nosebleeds, sore throat and trouble swallowing.   Eyes: Negative for eye problems and icterus.  Respiratory: Positive for mild cough and dyspnea on exertion. Negative for hemoptysis and wheezing.   Cardiovascular: Negative for chest pain and leg swelling.  Gastrointestinal: Positive for mild nausea (controlled). Negative for abdominal pain, constipation, diarrhea, and vomiting.  Genitourinary: Negative for bladder incontinence, difficulty urinating, dysuria, frequency and hematuria.   Musculoskeletal: Negative for back pain, gait problem, neck pain and neck stiffness.  Skin: Positive for mild rash on back.  Neurological: Negative for dizziness, extremity weakness, gait problem, headaches, light-headedness and seizures.  Hematological: Negative for adenopathy. Does not bruise/bleed easily.  Psychiatric/Behavioral: Negative for confusion, depression and sleep disturbance. The patient is not nervous/anxious.     PHYSICAL EXAMINATION:  Blood pressure (!) 146/87, pulse 82, temperature 98.7 F (37.1 C), temperature source Tympanic, resp. rate 17, weight 268 lb 4 oz (121.7 kg), SpO2 98 %.  ECOG PERFORMANCE STATUS: 1-2  Physical Exam  Constitutional: Oriented to person, place, and time and well-developed, well-nourished, and in no distress.   HENT:  Head: Normocephalic and atraumatic.  Mouth/Throat: Oropharynx is clear and moist. No oropharyngeal exudate.  Eyes: Conjunctivae are normal. Right eye exhibits no discharge. Left eye exhibits no discharge. No scleral icterus.  Neck:  Normal range of motion. Neck supple.  Cardiovascular: Normal rate, regular rhythm, normal heart sounds and intact distal pulses.   Pulmonary/Chest: Effort normal and breath sounds normal. No respiratory distress. No wheezes. No rales.  Abdominal: Soft. Bowel sounds are normal. Exhibits no distension and no mass. There is no tenderness.  Musculoskeletal: Normal range of motion. Exhibits no edema.  Lymphadenopathy:    No cervical adenopathy.  Neurological: Alert and oriented to person, place, and time. Exhibits normal muscle tone. Gait normal. Coordination normal.  Skin: Skin is warm and dry. Mild dry rash on back. Not diaphoretic. No erythema. No pallor.  Psychiatric: Mood, memory and judgment normal.  Vitals reviewed.  LABORATORY DATA: Lab Results  Component Value Date   WBC 6.5 02/15/2021   HGB 11.0 (  L) 02/15/2021   HCT 34.4 (L) 02/15/2021   MCV 96.6 02/15/2021   PLT 196 02/15/2021      Chemistry      Component Value Date/Time   NA 142 02/15/2021 0927   K 3.7 02/15/2021 0927   CL 108 02/15/2021 0927   CO2 27 02/15/2021 0927   BUN 11 02/15/2021 0927   CREATININE 0.82 02/15/2021 0927      Component Value Date/Time   CALCIUM 9.2 02/15/2021 0927   ALKPHOS 97 02/15/2021 0927   AST 15 02/15/2021 0927   ALT 10 02/15/2021 0927   BILITOT 0.4 02/15/2021 2800       RADIOGRAPHIC STUDIES:  CT Chest W Contrast  Result Date: 01/23/2021 CLINICAL DATA:  61 year old male with history of stage IV non-small cell lung cancer diagnosed in 2022. Chemotherapy in progress. Cough and shortness of breath intermittently. Follow-up study. EXAM: CT CHEST, ABDOMEN, AND PELVIS WITH CONTRAST TECHNIQUE: Multidetector CT imaging of the chest, abdomen and pelvis was performed following the standard protocol during bolus administration of intravenous contrast. RADIATION DOSE REDUCTION: This exam was performed according to the departmental dose-optimization program which includes automated exposure control,  adjustment of the mA and/or kV according to patient size and/or use of iterative reconstruction technique. CONTRAST:  127m OMNIPAQUE IOHEXOL 300 MG/ML  SOLN COMPARISON:  Multiple priors, most recently CT the chest, abdomen and pelvis 12/17/2020. FINDINGS: CT CHEST FINDINGS Cardiovascular: Heart size is normal. There is no significant pericardial fluid, thickening or pericardial calcification. No atherosclerotic calcifications are noted in the thoracic aorta or the coronary arteries. Mediastinum/Nodes: Prevascular lymph node measuring 1.2 cm in short axis (previously 1.1 cm in short axis). Fullness of soft tissues in the left hilar region, likely lymph nodes, measuring up to 1.2 cm in short axis (axial image 25 of series 2). Esophagus is unremarkable in appearance. No axillary lymphadenopathy. Lungs/Pleura: Dominant left upper lobe mass currently measures 5.8 x 4.4 x 4.5 cm (axial image 56 of series 6 and coronal image 95 of series 4) as compared with 6.0 x 3.7 x 4.6 cm on the prior study. A fiducial marker is again noted within this lesion. Numerous satellite nodules are again noted adjacent to the lesion and elsewhere throughout the left upper lobe, largest of which measures 8 mm (axial image 70 of series 6), increased from 6 mm on the prior study. In addition, there are multiple other small pulmonary nodules scattered throughout the lungs bilaterally indicative of widespread hematogenous metastases. These appear similar in number, but generally slightly increased in size compared to the prior examination. An example of this is a 8 mm nodule in the medial aspect of the right lower lobe (axial image 99 of series 6) which previously measured only 6 mm. No acute consolidative airspace disease. No pleural effusions. Musculoskeletal: There are no aggressive appearing lytic or blastic lesions noted in the visualized portions of the skeleton. CT ABDOMEN PELVIS FINDINGS Hepatobiliary: No suspicious cystic or solid hepatic  lesions. No intra or extrahepatic biliary ductal dilatation. Multiple noncalcified gallstones are noted within the lumen of the gallbladder which appears compressed around the indwelling gallstones. No pericholecystic fluid or surrounding inflammatory changes to suggest an acute cholecystitis at this time. Pancreas: No pancreatic mass. No pancreatic ductal dilatation. No pancreatic or peripancreatic fluid collections or inflammatory changes. Spleen: Unremarkable. Adrenals/Urinary Tract: 2 mm nonobstructive calculus in the interpolar region of the left kidney. Right kidney and right adrenal gland are normal in appearance. There is again a slightly nodular contour  of the left adrenal gland, stable compared to prior studies, previously not hypermetabolic on PET-CT, presumably benign. No hydroureteronephrosis. Urinary bladder is nearly decompressed, but otherwise unremarkable in appearance. Stomach/Bowel: The appearance of the stomach is normal. There is no pathologic dilatation of small bowel or colon. There is again mass-like mural thickening involving the distal descending colon and proximal sigmoid colon, best appreciated on axial images 87-99 of series 2, where there is also extensive soft tissue stranding in the adjacent pericolonic fat which has been chronic over several prior examinations, concerning for probable neoplasm with local infiltration of soft tissues. Normal appendix. Vascular/Lymphatic: Aortic atherosclerosis. No aneurysm or dissection noted in the abdominal or pelvic vasculature. No lymphadenopathy noted in the abdomen or pelvis. Reproductive: Prostate gland and seminal vesicles are unremarkable in appearance. Other: No significant volume of ascites.  No pneumoperitoneum. Musculoskeletal: There are no aggressive appearing lytic or blastic lesions noted in the visualized portions of the skeleton. IMPRESSION: 1. Today's study again demonstrates progressive disease. Although the previously noted left  upper lobe mass is similar in size, there is enlargement of numerous surrounding satellite nodules and numerous hematogenous metastases throughout the lungs, as above. Slight enlargement of left hilar lymph node and prevascular lymph node also noted. 2. No definite signs of metastatic disease in the abdomen or pelvis. However, there is persistent mural thickening, luminal narrowing and surrounding inflammatory changes in the colon involving the distal descending colon and proximal sigmoid colon, which given the chronicity of this finding is highly concerning for primary colonic neoplasm. 3. Cholelithiasis. 4. 2 mm nonobstructive calculus in the interpolar collecting system of the left kidney. 5. Aortic atherosclerosis. 6. Additional incidental findings, as above. Electronically Signed   By: Vinnie Langton M.D.   On: 01/23/2021 07:36   CT Angio Chest PE W and/or Wo Contrast  Result Date: 01/24/2021 CLINICAL DATA:  61 year old male with stage IV non-small cell lung cancer with diffuse chest and abdominal pain with cough and intermittent shortness of breath. Chemotherapy in progress. EXAM: CT ANGIOGRAPHY CHEST CT ABDOMEN AND PELVIS WITH CONTRAST TECHNIQUE: Multidetector CT imaging of the chest was performed using the standard protocol during bolus administration of intravenous contrast. Multiplanar CT image reconstructions and MIPs were obtained to evaluate the vascular anatomy. Multidetector CT imaging of the abdomen and pelvis was performed using the standard protocol during bolus administration of intravenous contrast. RADIATION DOSE REDUCTION: This exam was performed according to the departmental dose-optimization program which includes automated exposure control, adjustment of the mA and/or kV according to patient size and/or use of iterative reconstruction technique. CONTRAST:  119m OMNIPAQUE IOHEXOL 350 MG/ML SOLN COMPARISON:  01/22/2021 prior CTs FINDINGS: CTA CHEST FINDINGS Cardiovascular: This is a  technically borderline study due to respiratory motion artifact and less than optimal contrast opacification of the pulmonary arteries. No definite pulmonary emboli are identified. UPPER limits normal heart size noted. There is no evidence of thoracic aortic aneurysm. No pericardial effusion. Mediastinum/Nodes: Enlarged mediastinal and LEFT hilar lymph nodes are unchanged with index nodes as follows: A 1.2 cm prevascular node (series 1: Image 25) A 1.2 cm LEFT hilar node (1:32). No new or enlarging lymph nodes are identified since 01/22/2021. The visualized thyroid gland, trachea and esophagus are unremarkable. Lungs/Pleura: Unchanged dominant LEFT UPPER lobe mass again measures 5.8 x 4.4 x 4.5 cm (8:44) and contains a fiducial marker. Numerous adjacent satellite lesions/nodules are again identified. Numerous diffuse bilateral pulmonary nodules are unchanged from the prior study with the an index 8 mm LEFT  UPPER lobe nodule (8:60). Mild dependent/basilar atelectasis is noted. No new pulmonary opacities are identified. No pleural effusion or pneumothorax noted. Musculoskeletal: No acute or suspicious bony lesions are identified. Review of the MIP images confirms the above findings. CT ABDOMEN and PELVIS FINDINGS Hepatobiliary: The liver is unremarkable. Cholelithiasis noted with possible mild gallbladder wall thickening, but without definite pericholecystic inflammation. No biliary dilatation. Pancreas: Unremarkable Spleen: Unremarkable Adrenals/Urinary Tract: The kidneys, adrenal glands and bladder are unremarkable except for a punctate nonobstructing LEFT renal calculus. Stomach/Bowel: Circumferential wall thickening of the proximal sigmoid colon with mild adjacent stranding is unchanged. There is no evidence of bowel obstruction or new bowel wall thickening. There is no evidence of appendicitis. Vascular/Lymphatic: Aortic atherosclerosis. No enlarged abdominal or pelvic lymph nodes. Reproductive: Unremarkable  Other: No ascites, focal collection or pneumoperitoneum. Musculoskeletal: No acute findings noted. Review of the MIP images confirms the above findings. IMPRESSION: 1. No evidence of acute abnormality within the chest, abdomen or pelvis. No evidence of pulmonary emboli, but sensitivity is decreased due to technical factors as described above. 2. Cholelithiasis with possible mild gallbladder wall thickening, but without definite pericholecystic inflammation. Consider further evaluation with ultrasound if there is strong clinical suspicion for acute cholecystitis. 3. Unchanged dominant LEFT UPPER lobe malignancy, diffuse bilateral pulmonary metastatic disease/nodules and mediastinal and LEFT hilar lymphadenopathy. 4. Unchanged circumferential wall thickening of the proximal sigmoid colon with mild adjacent stranding, which may represent malignancy or chronic inflammation/infection. 5. LEFT nephrolithiasis 6. Aortic Atherosclerosis (ICD10-I70.0). Electronically Signed   By: Margarette Canada M.D.   On: 01/24/2021 14:17   MR Brain W Wo Contrast  Result Date: 01/22/2021 CLINICAL DATA:  Brain metastases EXAM: MRI HEAD WITHOUT AND WITH CONTRAST TECHNIQUE: Multiplanar, multiecho pulse sequences of the brain and surrounding structures were obtained without and with intravenous contrast. CONTRAST:  82m GADAVIST GADOBUTROL 1 MMOL/ML IV SOLN COMPARISON:  Brain MRI 10/08/2020 FINDINGS: Brain: There are numerous (28 individual lesions identified, 14 on the prior study) enhancing intracranial lesions in the bilateral cerebral and cerebellar hemispheres. Many of the lesions are new since 10/08/2020. Examples of new lesions include: *3 mm lesion in the right frontoparietal region cortex at the midline (1200-253). *3 mm lesion in the left superior frontal gyrus at the midline ((9563-875. *Two 3 mm enhancing lesions in the left superior frontal gyrus near the midline (1200-220). *Punctate lesion in the right parietal lobe ((6433-295.  *2 punctate lesions in the right frontal lobe anterolaterally (1200-213). *4 mm lesion in the right frontal operculum (1200-192). *4 mm lesion in the right temporal lobe (1200-191). *4 mm lesion in the left temporal lobe (1200-191). *3 mm lesion in the left caudate head (1200-183). Some of the lesions demonstrate SWI signal dropout consistent with hemorrhage. There is mild edema in the right parietal lobe, increased since the prior study. There is no other significant perilesional edema Background parenchymal volume is normal. There is no evidence of acute intracranial hemorrhage, extra-axial fluid collection, or infarct. There is no midline shift. Vascular: Normal flow voids. Skull and upper cervical spine: No suspicious osseous lesions are seen. Sinuses/Orbits: There is mild mucosal thickening in the paranasal sinuses. The globes and orbits are unremarkable. Other: There is a right mastoid effusion, new since the prior study. IMPRESSION: 1. Significant increase in number of subcentimeter enhancing metastatic lesions in both cerebral and cerebellar hemispheres, with at least 28 identified on the current study, previously 14. 2. Slightly increased edema in the right parietal lobe. No other significant perilesional edema. 3. New  right mastoid effusion. Electronically Signed   By: Valetta Mole M.D.   On: 01/22/2021 09:27   CT ABDOMEN PELVIS W CONTRAST  Result Date: 01/24/2021 CLINICAL DATA:  61 year old male with stage IV non-small cell lung cancer with diffuse chest and abdominal pain with cough and intermittent shortness of breath. Chemotherapy in progress. EXAM: CT ANGIOGRAPHY CHEST CT ABDOMEN AND PELVIS WITH CONTRAST TECHNIQUE: Multidetector CT imaging of the chest was performed using the standard protocol during bolus administration of intravenous contrast. Multiplanar CT image reconstructions and MIPs were obtained to evaluate the vascular anatomy. Multidetector CT imaging of the abdomen and pelvis was  performed using the standard protocol during bolus administration of intravenous contrast. RADIATION DOSE REDUCTION: This exam was performed according to the departmental dose-optimization program which includes automated exposure control, adjustment of the mA and/or kV according to patient size and/or use of iterative reconstruction technique. CONTRAST:  116m OMNIPAQUE IOHEXOL 350 MG/ML SOLN COMPARISON:  01/22/2021 prior CTs FINDINGS: CTA CHEST FINDINGS Cardiovascular: This is a technically borderline study due to respiratory motion artifact and less than optimal contrast opacification of the pulmonary arteries. No definite pulmonary emboli are identified. UPPER limits normal heart size noted. There is no evidence of thoracic aortic aneurysm. No pericardial effusion. Mediastinum/Nodes: Enlarged mediastinal and LEFT hilar lymph nodes are unchanged with index nodes as follows: A 1.2 cm prevascular node (series 1: Image 25) A 1.2 cm LEFT hilar node (1:32). No new or enlarging lymph nodes are identified since 01/22/2021. The visualized thyroid gland, trachea and esophagus are unremarkable. Lungs/Pleura: Unchanged dominant LEFT UPPER lobe mass again measures 5.8 x 4.4 x 4.5 cm (8:44) and contains a fiducial marker. Numerous adjacent satellite lesions/nodules are again identified. Numerous diffuse bilateral pulmonary nodules are unchanged from the prior study with the an index 8 mm LEFT UPPER lobe nodule (8:60). Mild dependent/basilar atelectasis is noted. No new pulmonary opacities are identified. No pleural effusion or pneumothorax noted. Musculoskeletal: No acute or suspicious bony lesions are identified. Review of the MIP images confirms the above findings. CT ABDOMEN and PELVIS FINDINGS Hepatobiliary: The liver is unremarkable. Cholelithiasis noted with possible mild gallbladder wall thickening, but without definite pericholecystic inflammation. No biliary dilatation. Pancreas: Unremarkable Spleen: Unremarkable  Adrenals/Urinary Tract: The kidneys, adrenal glands and bladder are unremarkable except for a punctate nonobstructing LEFT renal calculus. Stomach/Bowel: Circumferential wall thickening of the proximal sigmoid colon with mild adjacent stranding is unchanged. There is no evidence of bowel obstruction or new bowel wall thickening. There is no evidence of appendicitis. Vascular/Lymphatic: Aortic atherosclerosis. No enlarged abdominal or pelvic lymph nodes. Reproductive: Unremarkable Other: No ascites, focal collection or pneumoperitoneum. Musculoskeletal: No acute findings noted. Review of the MIP images confirms the above findings. IMPRESSION: 1. No evidence of acute abnormality within the chest, abdomen or pelvis. No evidence of pulmonary emboli, but sensitivity is decreased due to technical factors as described above. 2. Cholelithiasis with possible mild gallbladder wall thickening, but without definite pericholecystic inflammation. Consider further evaluation with ultrasound if there is strong clinical suspicion for acute cholecystitis. 3. Unchanged dominant LEFT UPPER lobe malignancy, diffuse bilateral pulmonary metastatic disease/nodules and mediastinal and LEFT hilar lymphadenopathy. 4. Unchanged circumferential wall thickening of the proximal sigmoid colon with mild adjacent stranding, which may represent malignancy or chronic inflammation/infection. 5. LEFT nephrolithiasis 6. Aortic Atherosclerosis (ICD10-I70.0). Electronically Signed   By: JMargarette CanadaM.D.   On: 01/24/2021 14:17   CT Abdomen Pelvis W Contrast  Result Date: 01/23/2021 CLINICAL DATA:  61year old male with history of  stage IV non-small cell lung cancer diagnosed in 2022. Chemotherapy in progress. Cough and shortness of breath intermittently. Follow-up study. EXAM: CT CHEST, ABDOMEN, AND PELVIS WITH CONTRAST TECHNIQUE: Multidetector CT imaging of the chest, abdomen and pelvis was performed following the standard protocol during bolus  administration of intravenous contrast. RADIATION DOSE REDUCTION: This exam was performed according to the departmental dose-optimization program which includes automated exposure control, adjustment of the mA and/or kV according to patient size and/or use of iterative reconstruction technique. CONTRAST:  180m OMNIPAQUE IOHEXOL 300 MG/ML  SOLN COMPARISON:  Multiple priors, most recently CT the chest, abdomen and pelvis 12/17/2020. FINDINGS: CT CHEST FINDINGS Cardiovascular: Heart size is normal. There is no significant pericardial fluid, thickening or pericardial calcification. No atherosclerotic calcifications are noted in the thoracic aorta or the coronary arteries. Mediastinum/Nodes: Prevascular lymph node measuring 1.2 cm in short axis (previously 1.1 cm in short axis). Fullness of soft tissues in the left hilar region, likely lymph nodes, measuring up to 1.2 cm in short axis (axial image 25 of series 2). Esophagus is unremarkable in appearance. No axillary lymphadenopathy. Lungs/Pleura: Dominant left upper lobe mass currently measures 5.8 x 4.4 x 4.5 cm (axial image 56 of series 6 and coronal image 95 of series 4) as compared with 6.0 x 3.7 x 4.6 cm on the prior study. A fiducial marker is again noted within this lesion. Numerous satellite nodules are again noted adjacent to the lesion and elsewhere throughout the left upper lobe, largest of which measures 8 mm (axial image 70 of series 6), increased from 6 mm on the prior study. In addition, there are multiple other small pulmonary nodules scattered throughout the lungs bilaterally indicative of widespread hematogenous metastases. These appear similar in number, but generally slightly increased in size compared to the prior examination. An example of this is a 8 mm nodule in the medial aspect of the right lower lobe (axial image 99 of series 6) which previously measured only 6 mm. No acute consolidative airspace disease. No pleural effusions. Musculoskeletal:  There are no aggressive appearing lytic or blastic lesions noted in the visualized portions of the skeleton. CT ABDOMEN PELVIS FINDINGS Hepatobiliary: No suspicious cystic or solid hepatic lesions. No intra or extrahepatic biliary ductal dilatation. Multiple noncalcified gallstones are noted within the lumen of the gallbladder which appears compressed around the indwelling gallstones. No pericholecystic fluid or surrounding inflammatory changes to suggest an acute cholecystitis at this time. Pancreas: No pancreatic mass. No pancreatic ductal dilatation. No pancreatic or peripancreatic fluid collections or inflammatory changes. Spleen: Unremarkable. Adrenals/Urinary Tract: 2 mm nonobstructive calculus in the interpolar region of the left kidney. Right kidney and right adrenal gland are normal in appearance. There is again a slightly nodular contour of the left adrenal gland, stable compared to prior studies, previously not hypermetabolic on PET-CT, presumably benign. No hydroureteronephrosis. Urinary bladder is nearly decompressed, but otherwise unremarkable in appearance. Stomach/Bowel: The appearance of the stomach is normal. There is no pathologic dilatation of small bowel or colon. There is again mass-like mural thickening involving the distal descending colon and proximal sigmoid colon, best appreciated on axial images 87-99 of series 2, where there is also extensive soft tissue stranding in the adjacent pericolonic fat which has been chronic over several prior examinations, concerning for probable neoplasm with local infiltration of soft tissues. Normal appendix. Vascular/Lymphatic: Aortic atherosclerosis. No aneurysm or dissection noted in the abdominal or pelvic vasculature. No lymphadenopathy noted in the abdomen or pelvis. Reproductive: Prostate gland and seminal  vesicles are unremarkable in appearance. Other: No significant volume of ascites.  No pneumoperitoneum. Musculoskeletal: There are no aggressive  appearing lytic or blastic lesions noted in the visualized portions of the skeleton. IMPRESSION: 1. Today's study again demonstrates progressive disease. Although the previously noted left upper lobe mass is similar in size, there is enlargement of numerous surrounding satellite nodules and numerous hematogenous metastases throughout the lungs, as above. Slight enlargement of left hilar lymph node and prevascular lymph node also noted. 2. No definite signs of metastatic disease in the abdomen or pelvis. However, there is persistent mural thickening, luminal narrowing and surrounding inflammatory changes in the colon involving the distal descending colon and proximal sigmoid colon, which given the chronicity of this finding is highly concerning for primary colonic neoplasm. 3. Cholelithiasis. 4. 2 mm nonobstructive calculus in the interpolar collecting system of the left kidney. 5. Aortic atherosclerosis. 6. Additional incidental findings, as above. Electronically Signed   By: Vinnie Langton M.D.   On: 01/23/2021 07:36   DG Chest Port 1 View  Result Date: 01/24/2021 CLINICAL DATA:  Sepsis EXAM: PORTABLE CHEST 1 VIEW COMPARISON:  Chest x-ray 09/14/2020, CT chest 01/22/2021 FINDINGS: Heart size and mediastinum are stable and within normal limits. No new consolidation or infiltrates identified in the lungs. 4.4 x 4.2 cm left upper lobe mass again visualized. No pleural effusion or pneumothorax. IMPRESSION: No acute process identified.  Left upper lobe mass. Electronically Signed   By: Ofilia Neas M.D.   On: 01/24/2021 12:41   US Abdomen Limited RUQ (LIVER/GB)  Result Date: 01/24/2021 CLINICAL DATA:  Right upper quadrant pain EXAM: ULTRASOUND ABDOMEN LIMITED RIGHT UPPER QUADRANT COMPARISON:  None. FINDINGS: Gallbladder: Several echogenic shadowing calculi. Evidence of wall adenomyomatosis. No significant wall thickening or surrounding fluid. No sonographic Percell Miller sign reported. Common bile duct: Diameter:  5 mm Liver: No focal lesion identified. Within normal limits in parenchymal echogenicity. Portal vein is patent on color Doppler imaging with normal direction of blood flow towards the liver. Other: None. IMPRESSION: Cholelithiasis and evidence of gallbladder adenomyomatosis. Electronically Signed   By: Ofilia Neas M.D.   On: 01/24/2021 16:09     ASSESSMENT/PLAN:  This is a very pleasant 61 year old African-American male with stage IV (T2b, N2, M1 C) non-small cell lung cancer, adenocarcinoma.  He presented with a left upper lobe lung mass in addition to left hilar and mediastinal lymphadenopathy.  He had also had a small left iliac osseous lesion and metastatic disease to the brain.  He was diagnosed in September 2022.  PD-L1 expression 10%.  Guardant 360 does not have enough circulating tumor and his tissue was negative for any actionable mutations.    The patient is currently undergoing palliative systemic chemotherapy with carboplatin for an AUC of 5, Alimta 500 mg per metered squared, Keytruda 200 mg IV every 3 weeks.  The patient status post 5 cycles tolerated it fairly well.  Starting from cycle #5, the patient began maintenance Alimta and Keytruda.   He completed SRS to the metastatic brain lesion under the care of Dr. Lisbeth Renshaw in October 2022.   The patient had disease progression in the brain in January 2023 and underwent whole brain radiation under the care of Dr. Lisbeth Renshaw which was completed on 02/10/2021.   The patient was recently in the hospital had a repeat CT scan the chest, abdomen, pelvis.  The patient's scan showed some disease progression with numerous satellite nodule and numerous metastases in both lungs.  Dr. Julien Nordmann personally and  independently reviewed the scan results and discussed the results with the patient today.  Given the findings of Dr. Julien Nordmann recommends changing treatment. The patient was given the option of docetaxel and cyramza vs referral to hospice/palliative care.  The patient is interested in chemotherapy with docetaxel and Cyramza and is expected to start his first treatment on 02/22/21  The adverse side effects were discussed including but not limited to myelosuppression, alopecia, peripheral neuropathy, nausea, vomiting, diarrhea, constipation, liver, and kidney dysfunction.  We also discussed the adverse side effects of hypertension and bleeding with Cyramza.  The patient was advised to take his blood pressure medicine as prescribed prior to his appointments.  We will arrange for him to start his first cycle of treatment next week.  Encouraged him to continue taking Remeron and follow-up with palliative care.  He is scheduled to see them today.  He is also scheduled to see Dr. Hubbard Hartshorn from psychiatry next week.  I sent a prescription for Decadron to the patient's pharmacy.  He was advised to take 2 tablets twice a day the day before, the day of, and the day after chemotherapy.  We will see him back for follow-up visit in 2 weeks for evaluation and a 1 week follow-up visit to manage any adverse side effects of treatment  I discussed with the patient that the Neulasta shot may cause some bone pain.  The patient was advised to use Claritin and his pain medication as needed for bone pain.  The patient was advised to call immediately if he has any concerning symptoms in the interval. The patient voices understanding of current disease status and treatment options and is in agreement with the current care plan. All questions were answered. The patient knows to call the clinic with any problems, questions or concerns. We can certainly see the patient much sooner if necessary.    Orders Placed This Encounter  Procedures   CBC with Differential ([place] Only)    Standing Status:   Standing    Number of Occurrences:   12    Standing Expiration Date:   02/15/2022   CMP ([place] only)    Standing Status:   Standing    Number of Occurrences:   12     Standing Expiration Date:   02/15/2022   Total Protein, Urine dipstick    Standing Status:   Standing    Number of Occurrences:   20    Standing Expiration Date:   02/15/2022   Ambulatory Referral to [facility] Nutrition    Referral Priority:   Routine    Referral Type:   Consultation    Referral Reason:   Specialty Services Required    Number of Visits Requested:   Show Low, PA-C 02/15/21  ADDENDUM: Hematology/Oncology Attending: I had a face-to-face encounter with the patient today.  I reviewed his record, lab, scan and recommended his care plan.  This is a very pleasant 61 years old African-American male diagnosed with a stage IV (T2b, N2, M1 C) non-small cell lung cancer, adenocarcinoma in September 2022 and presented with left upper lobe lung mass in addition to left hilar and mediastinal lymphadenopathy as well as a small left iliac osseous metastasis and metastatic disease to the brain. The patient has no actionable mutations and PD-L1 expression was 10%. The patient started systemic chemotherapy with carboplatin, Alimta and Keytruda status post 5 cycles.  Unfortunately he was found recently to have significant disease progression  in the brain as well as disease progression in the lung.  He underwent whole brain irradiation under the care of Dr. Lisbeth Renshaw completed on February 10, 2021. I had a lengthy discussion with the patient today about his current condition and treatment options.  I recommended for the patient to discontinue his current treatment with maintenance Alimta and Keytruda at this point. I discussed with him other treatment options including palliative care and hospice referral versus palliative second line systemic chemotherapy with docetaxel 75 Mg/M2 and Cyramza 10 Mg/KG every 3 weeks with Neulasta support. The patient is not interested in palliative care and hospice at this point and he would like to continue with additional treatment. He is expected to  start the first cycle of this treatment next week.  I discussed with the patient the adverse effect of this treatment including but not limited to alopecia, myelosuppression, nausea and vomiting, peripheral neuropathy, liver or renal dysfunction as well as the hemorrhagic adverse effect of Cyramza. He is expected to start the first cycle of this treatment next week. The patient will come back for follow-up visit in 2 weeks for evaluation and management of any adverse effect of his treatment. He was advised to call immediately if he has any other concerning symptoms in the interval. The total time spent in the appointment was 40 minutes. Disclaimer: This note was dictated with voice recognition software. Similar sounding words can inadvertently be transcribed and may be missed upon review. Eilleen Kempf, MD 02/15/21

## 2021-02-15 ENCOUNTER — Inpatient Hospital Stay: Payer: Medicaid Other | Admitting: *Deleted

## 2021-02-15 ENCOUNTER — Ambulatory Visit: Payer: Self-pay

## 2021-02-15 ENCOUNTER — Ambulatory Visit: Payer: Self-pay | Admitting: Physician Assistant

## 2021-02-15 ENCOUNTER — Inpatient Hospital Stay (HOSPITAL_BASED_OUTPATIENT_CLINIC_OR_DEPARTMENT_OTHER): Payer: Medicaid Other | Admitting: Nurse Practitioner

## 2021-02-15 ENCOUNTER — Other Ambulatory Visit: Payer: Self-pay

## 2021-02-15 ENCOUNTER — Other Ambulatory Visit: Payer: Self-pay | Admitting: Internal Medicine

## 2021-02-15 ENCOUNTER — Inpatient Hospital Stay: Payer: Medicaid Other

## 2021-02-15 ENCOUNTER — Encounter: Payer: Self-pay | Admitting: Nurse Practitioner

## 2021-02-15 ENCOUNTER — Other Ambulatory Visit (HOSPITAL_COMMUNITY): Payer: Self-pay

## 2021-02-15 ENCOUNTER — Inpatient Hospital Stay (HOSPITAL_BASED_OUTPATIENT_CLINIC_OR_DEPARTMENT_OTHER): Payer: Medicaid Other | Admitting: Physician Assistant

## 2021-02-15 ENCOUNTER — Encounter: Payer: Self-pay | Admitting: Internal Medicine

## 2021-02-15 VITALS — BP 146/87 | HR 82 | Temp 98.7°F | Resp 17 | Wt 268.2 lb

## 2021-02-15 DIAGNOSIS — R630 Anorexia: Secondary | ICD-10-CM | POA: Diagnosis not present

## 2021-02-15 DIAGNOSIS — Z5111 Encounter for antineoplastic chemotherapy: Secondary | ICD-10-CM | POA: Diagnosis present

## 2021-02-15 DIAGNOSIS — K5900 Constipation, unspecified: Secondary | ICD-10-CM | POA: Insufficient documentation

## 2021-02-15 DIAGNOSIS — C3492 Malignant neoplasm of unspecified part of left bronchus or lung: Secondary | ICD-10-CM | POA: Diagnosis not present

## 2021-02-15 DIAGNOSIS — G4700 Insomnia, unspecified: Secondary | ICD-10-CM | POA: Diagnosis not present

## 2021-02-15 DIAGNOSIS — C3412 Malignant neoplasm of upper lobe, left bronchus or lung: Secondary | ICD-10-CM

## 2021-02-15 DIAGNOSIS — R519 Headache, unspecified: Secondary | ICD-10-CM | POA: Diagnosis not present

## 2021-02-15 DIAGNOSIS — G893 Neoplasm related pain (acute) (chronic): Secondary | ICD-10-CM

## 2021-02-15 DIAGNOSIS — Z7189 Other specified counseling: Secondary | ICD-10-CM

## 2021-02-15 DIAGNOSIS — I10 Essential (primary) hypertension: Secondary | ICD-10-CM | POA: Diagnosis not present

## 2021-02-15 DIAGNOSIS — Z79899 Other long term (current) drug therapy: Secondary | ICD-10-CM | POA: Diagnosis not present

## 2021-02-15 DIAGNOSIS — C7931 Secondary malignant neoplasm of brain: Secondary | ICD-10-CM | POA: Insufficient documentation

## 2021-02-15 DIAGNOSIS — R634 Abnormal weight loss: Secondary | ICD-10-CM

## 2021-02-15 DIAGNOSIS — F17210 Nicotine dependence, cigarettes, uncomplicated: Secondary | ICD-10-CM | POA: Insufficient documentation

## 2021-02-15 DIAGNOSIS — R059 Cough, unspecified: Secondary | ICD-10-CM | POA: Insufficient documentation

## 2021-02-15 LAB — CMP (CANCER CENTER ONLY)
ALT: 10 U/L (ref 0–44)
AST: 15 U/L (ref 15–41)
Albumin: 3.6 g/dL (ref 3.5–5.0)
Alkaline Phosphatase: 97 U/L (ref 38–126)
Anion gap: 7 (ref 5–15)
BUN: 11 mg/dL (ref 6–20)
CO2: 27 mmol/L (ref 22–32)
Calcium: 9.2 mg/dL (ref 8.9–10.3)
Chloride: 108 mmol/L (ref 98–111)
Creatinine: 0.82 mg/dL (ref 0.61–1.24)
GFR, Estimated: >60 mL/min (ref >60)
Glucose, Bld: 109 mg/dL — ABNORMAL HIGH (ref 70–99)
Potassium: 3.7 mmol/L (ref 3.5–5.1)
Sodium: 142 mmol/L (ref 135–145)
Total Bilirubin: 0.4 mg/dL (ref 0.3–1.2)
Total Protein: 7.6 g/dL (ref 6.5–8.1)

## 2021-02-15 LAB — CBC WITH DIFFERENTIAL (CANCER CENTER ONLY)
Abs Immature Granulocytes: 0.02 10*3/uL (ref 0.00–0.07)
Basophils Absolute: 0 10*3/uL (ref 0.0–0.1)
Basophils Relative: 1 %
Eosinophils Absolute: 0.2 10*3/uL (ref 0.0–0.5)
Eosinophils Relative: 3 %
HCT: 34.4 % — ABNORMAL LOW (ref 39.0–52.0)
Hemoglobin: 11 g/dL — ABNORMAL LOW (ref 13.0–17.0)
Immature Granulocytes: 0 %
Lymphocytes Relative: 16 %
Lymphs Abs: 1 10*3/uL (ref 0.7–4.0)
MCH: 30.9 pg (ref 26.0–34.0)
MCHC: 32 g/dL (ref 30.0–36.0)
MCV: 96.6 fL (ref 80.0–100.0)
Monocytes Absolute: 0.7 10*3/uL (ref 0.1–1.0)
Monocytes Relative: 11 %
Neutro Abs: 4.5 10*3/uL (ref 1.7–7.7)
Neutrophils Relative %: 69 %
Platelet Count: 196 10*3/uL (ref 150–400)
RBC: 3.56 MIL/uL — ABNORMAL LOW (ref 4.22–5.81)
RDW: 13.2 % (ref 11.5–15.5)
WBC Count: 6.5 10*3/uL (ref 4.0–10.5)
nRBC: 0 % (ref 0.0–0.2)

## 2021-02-15 LAB — TSH: TSH: 0.979 u[IU]/mL (ref 0.320–4.118)

## 2021-02-15 MED ORDER — DEXAMETHASONE 4 MG PO TABS
ORAL_TABLET | ORAL | 2 refills | Status: DC
  Filled 2021-02-15: qty 40, 10d supply, fill #0
  Filled 2021-02-24: qty 40, 10d supply, fill #1

## 2021-02-15 MED ORDER — MIRTAZAPINE 30 MG PO TABS
30.0000 mg | ORAL_TABLET | Freq: Every day | ORAL | 0 refills | Status: DC
  Filled 2021-02-15: qty 30, 30d supply, fill #0

## 2021-02-15 MED ORDER — GABAPENTIN 300 MG PO CAPS
300.0000 mg | ORAL_CAPSULE | Freq: Every day | ORAL | 0 refills | Status: DC
  Filled 2021-02-15: qty 30, 30d supply, fill #0

## 2021-02-15 NOTE — Progress Notes (Signed)
[place]  [phone] [phone]) [phone]   Name: Raymond [name] Date: 02/15/2021 MRN: [id number]  DOB: 1960-09-28  Patient Care Team: Pcp, No as PCP - General Valrie Hart, RN as Oncology Nurse Navigator (Oncology)    REASON FOR CONSULTATION: Raymond [name] is a 61 y.o. male with medical history including stage IV non-small cell lung cancer, adenocarcinoma of the left lower lobe, mediastinal lymphadenopathy with brain metastasis, s/p 5 cycles of carboplatin, Keytruda, and Alimta, stereotactic radiosurgery to brain mets, hypertension, and GERD.  Recent MRI on (01/21/21) showed significant increase in enhancing metastatic cerebral and cerebellar hemisphere lesions. Now undergoing a total of 10 whole brain radiation treatments. Palliative ask to see for ongoing symptom management support and goals of care.    SOCIAL HISTORY:     reports that he has been smoking cigarettes. He has never used smokeless tobacco. He reports that he does not drink alcohol and does not use drugs.  ADVANCE DIRECTIVES:  None on file. Education provided. Raymond [name] aware his wife, [name] is his medical decision maker in the setting of no document. He was given an advanced directive packet with plans to complete.   CODE STATUS: Full code   ALLERGIES:  is allergic to benadryl [diphenhydramine].  MEDICATIONS:  Current Outpatient Medications  Medication Sig Dispense Refill   acetaminophen (TYLENOL) 650 MG CR tablet Take 650 mg by mouth every 8 (eight) hours as needed for pain.     amLODipine (NORVASC) 5 MG tablet Take 1 tablet (5 mg total) by mouth daily. 30 tablet 0   dexamethasone (DECADRON) 4 MG tablet Please take 2 tablets 2 times a day the day before, the day of, and the day after treatment. 40 tablet 2   folic acid (FOLVITE) 1 MG tablet Take 1 tablet (1 mg total) by mouth daily. 30 tablet 1   gabapentin (NEURONTIN) 300 MG capsule Take 1 capsule (300  mg total) by mouth at bedtime. 30 capsule 0   memantine (NAMENDA) 10 MG tablet Take 1 tablet (10 mg total) by mouth 2 (two) times daily. 60 tablet 4   memantine (NAMENDA) 5 MG tablet Begin this prescription the first day of brain radiation. Week 1: Take 1 tablet by mouth every morning. Week 2: Take 1 tablet every morning and evening. Week 3: Take 2 tablets every morning, and 1 tablet in the evening. Week 4: Take 2 tablets in the morning and evening. Fill subsequent prescription each month. 70 tablet 0   mirtazapine (REMERON) 30 MG tablet Take 1 tablet (30 mg total) by mouth at bedtime. 30 tablet 0   Misc Natural Products (OSTEO BI-FLEX ADV JOINT SHIELD) TABS Take 1 tablet by mouth daily.     oxyCODONE (OXY IR/ROXICODONE) 5 MG immediate release tablet Take 1 tablet (5 mg total) by mouth every 6 (six) hours as needed for severe pain. 60 tablet 0   prochlorperazine (COMPAZINE) 10 MG tablet Take 1 tablet (10 mg total) by mouth every 6 (six) hours as needed for nausea or vomiting. 30 tablet 0   simethicone (MYLICON) 353 MG chewable tablet Chew 125 mg by mouth every 6 (six) hours as needed for flatulence.     tamsulosin (FLOMAX) 0.4 MG CAPS capsule Take 1 capsule (0.4 mg total) by mouth daily after supper. 30 capsule 0   Wheat Dextrin (BENEFIBER DRINK MIX) PACK Take 1 Package by mouth daily at 6 (six) AM.     No current facility-administered  medications for this visit.    VITAL SIGNS: There were no vitals taken for this visit. There were no vitals filed for this visit.   Estimated body mass index is 37.41 kg/m as calculated from the following:   Height as of 02/08/21: 5\' 11"  (1.803 m).   Weight as of an earlier encounter on 02/15/21: 268 lb 4 oz (121.7 kg).   PERFORMANCE STATUS (ECOG) : 2 - Symptomatic, <50% confined to bed  Physical Exam General: NAD, ambulatory  Cardiovascular: regular rate and rhythm Pulmonary: clear ant fields Abdomen: soft, nontender, + bowel sounds Extremities: no edema,  left leg weakness  Skin: no rashes Neurological: Weakness but otherwise nonfocal, tearful  IMPRESSION:  Raymond [name] presents today for follow-up. He is ambulatory without assistance today compared to last visit when he was in a wheelchair. No acute distress noted. Complains of occasional pain but improved.   He is scheduled to complete his advanced directives today however, reports he would like to reschedule. His wife has additional questions and to allow her this time would like to consider future visit she can be involved. He is aware our next advanced directive clinic will the last Friday of February.    Pain related to neoplasm  Raymond [name] complains of ongoing bilateral leg pain and some generalized aches and pain. He is tolerating the Oxy IR. Feels pain is manageable since starting. He reports pain is worst later in the evening and morning. Education provided to increase gabapentin from 100mg  at bedtime to 300mg  at bedtime. We will continue to closely monitor and make needed adjustments. Discussed if pain remains ongoing will consider a more long-acting medication support.   Constipation  Improved with daily Miralax use. We discussed the importance of bowel regimen to prevent further complications and in addition to the use of opioids. He understands if no bowel movement in 24 hours to increase to twice daily. Encouraged water intake.   Weight loss/decreased appetite  Raymond [name] endorses ongoing weight loss. His appetite is somewhat improved with use of mirtazapine but does remain a concern. He is able to eat a little more but pushes himself. Weight on 2/6 was 274 lbs today 268.4 lbs. We will increase mirtazapine to 30mg  and closely monitor. Education provided on high protein intake with recommendations for protein enriched foods and use of protein shakes such as ensure and boost.   4. Goals of Care    Raymond [name] is tearful expresses his anxiety and fear of coming to appointments in regards to  what he will be told (his cancer has progressed or no other available treatments). Emotional support provided. He is trying to do all he can for his family given his challenges of trying to work, undergo treatment, and manage his emotional stress of such a life-changing diagnosis.   He shares he is not ready to "give up" and knows God is in control. He is tearful he is not ready to consider hospice or "not doing anything" to allow him every opportunity to thrive. Therapeutic listening and support offered. He speaks to his anxiety in starting a different regimen on next week.   I have sent in referral with Dr. Michail Sermon and Lamondre has an upcoming appointment.   [place] is remaining hopeful with realistic understanding of his condition and severity.   I discussed the importance of continued conversation with family and their medical providers regarding overall plan of care and treatment options, ensuring decisions are within the context of the patients values and GOCs.  PLAN: Advanced Directive education. He was scheduled to complete today however wishes to postpone expressing his wife has questions regarding the document.  Oxy IR 5 mg every 6 hours as needed for pain. Will continue to closely evaluate. Neurontin 300 mg nightly, will continue to evaluate and increase dose for added support.  Referral made to Dr. Nelida Meuse for ongoing support.  Ongoing goals of care discussions.  I will plan to see patient back in a week in collaboration to other oncology appointments.    Patient expressed understanding and was in agreement with this plan. He also understands that He can call the clinic at any time with any questions, concerns, or complaints.   Time Total: 45 min.   Visit consisted of counseling and education dealing with the complex and emotionally intense issues of symptom management and palliative care in the setting of serious and potentially life-threatening illness.Greater than 50%  of this  time was spent counseling and coordinating care related to the above assessment and plan.  [name], AGPCNP-BC  [place]

## 2021-02-15 NOTE — Patient Instructions (Signed)
-  We covered a lot of important information at your appointment today regarding what the treatment plan is moving forward. Here are the the main points that were discussed at your office visit with [place] today:  -The treatment that you will receive consists of two chemotherapy drugs, called Docetaxel and Cyramza (Ramucirumab) -We are planning on starting your treatment next week on _/_/_  -Your treatment will be given once every 3 weeks. We will check your labs once a week just to make sure that important components of your blood are in an acceptable range -You will receive an injection about 3 days after chemotherapy. The purpose of this injection is to boost your bodies infection fighting cells to protect you from getting an infection.  -We will get a CT scan after 3 treatments to check on the progress of treatment  Medications:  -I have sent a few important medication prescriptions to your pharmacy.  -Compazine was sent to your pharmacy. This medication is for nausea. You may take this every 6 hours as needed if you feel nausous.  -I have sent a prescription for Decadron (Dexamethasone) to your pharmacy. This medication is a steroids. The purpose of this medication is because it is one if the pre-medications that is needed before you get treatment. You will need to take Two Tablets Twice a day (for a total of 4 tablets a day) the day before, the day of, and the day after chemotherapy. This means that you will take 12 tablets total over those 3 consecutive days.   Molecular Studies   Imaging Studies:   Referrals:   Side effects: The side effects of treatment may include but are not limited to alopecia (losing your hair), myelosuppression (dropping your blood counts), nausea and vomiting, peripheral neuropathy (numbness in the hands and feet), liver or renal dysfunction as well as the adverse effect of Cyramza including increased risk for hemorrhage (bleeding) and GI perforation. Please seek medical  attention if you develop concerning or significant bleeding.   Follow up:  -We will see you back for a follow up visit in 4 weeks with cycle #2.

## 2021-02-15 NOTE — Progress Notes (Signed)
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   [id number]:Pembrolizumab 200 mg  IV D1 + Pemetrexed 500 mg/m2 IV D1 + Carboplatin AUC=5 IV D1 q21 Days:   A cycle is every 21 days:     Pembrolizumab      Pemetrexed      Carboplatin   **Always confirm dose/schedule in your pharmacy ordering system**  REASON: Disease Progression PRIOR TREATMENT: Off Pathway: Pembrolizumab 200 mg  IV D1 + Pemetrexed 500 mg/m2 IV D1 + Carboplatin AUC=5 IV D1 q21 Days TREATMENT RESPONSE: Stable Disease (SD)  START ON PATHWAY REGIMEN - Non-Small Cell Lung     A cycle is every 21 days:     Ramucirumab      Docetaxel   **Always confirm dose/schedule in your pharmacy ordering system**  Patient Characteristics: Stage IV Metastatic, Nonsquamous, Molecular Analysis Completed, Molecular Alteration Present and Targeted Therapy Exhausted OR EGFR Exon 20+ or KRAS G12C+ or HER2+ Present and No Prior Chemo/Immunotherapy OR No Alteration Present, Second Line -  Chemotherapy/Immunotherapy, PS = 0, 1, No Prior PD-1/PD-L1  Inhibitor or Prior PD-1/PD-L1 Inhibitor + Chemotherapy, and Not a Candidate for Immunotherapy Therapeutic Status: Stage IV Metastatic Histology: Nonsquamous Cell Broad Molecular Profiling Status: Engineer, manufacturing Analysis Results: No Alteration Present ECOG Performance Status: 1 Chemotherapy/Immunotherapy Line of Therapy: Second Line Chemotherapy/Immunotherapy Immunotherapy Candidate Status: Not a Candidate for Immunotherapy Prior Immunotherapy Status: Prior PD-1/PD-L1 Inhibitor + Chemotherapy Intent of Therapy: Non-Curative / Palliative Intent, Discussed with Patient

## 2021-02-16 ENCOUNTER — Encounter: Payer: Self-pay | Admitting: General Practice

## 2021-02-16 ENCOUNTER — Other Ambulatory Visit (HOSPITAL_COMMUNITY): Payer: Self-pay

## 2021-02-16 NOTE — Progress Notes (Signed)
[facility] Spiritual Care Note  Updated on Palliative visit by Lauren Somers/LCSW. Phoned Mr Jarboe for pastoral check-in. He would like to talk more so he can process "so many changes" in his health and care plan. We plan to connect by phone on Thursday to schedule a Spiritual Care visit around his updated appointments.   [place], [place], [facility] Pager [phone] Voicemail [phone]

## 2021-02-17 ENCOUNTER — Telehealth: Payer: Self-pay | Admitting: Internal Medicine

## 2021-02-17 NOTE — Telephone Encounter (Signed)
Sch per 2/15 inbasket, , pt aware

## 2021-02-18 ENCOUNTER — Telehealth: Payer: Self-pay

## 2021-02-18 ENCOUNTER — Encounter: Payer: Self-pay | Admitting: General Practice

## 2021-02-18 NOTE — Progress Notes (Signed)
[facility] Spiritual Care Note  Reached Raymond [name] by phone to confirm that we plan to visit together in infusion at his treatment on Tuesday 2/21. Referred his medical questions to Manter Herndon/RN for clarification.   [place], [place], [facility] Pager [phone] Voicemail [phone]

## 2021-02-18 NOTE — Telephone Encounter (Signed)
Patient wanted medication refill on Amoxicillin and Amlodipine. I left a message for the patient informing him that Dr. Julien Nordmann cannot refill those for him, he will need to call his PCP. I left a callback number if he has any questions or concerns.

## 2021-02-22 ENCOUNTER — Encounter: Payer: 59 | Admitting: Nutrition

## 2021-02-23 ENCOUNTER — Other Ambulatory Visit: Payer: Self-pay | Admitting: Physician Assistant

## 2021-02-23 ENCOUNTER — Encounter: Payer: Self-pay | Admitting: General Practice

## 2021-02-23 ENCOUNTER — Telehealth: Payer: Self-pay | Admitting: Medical Oncology

## 2021-02-23 ENCOUNTER — Inpatient Hospital Stay: Payer: Medicaid Other

## 2021-02-23 ENCOUNTER — Inpatient Hospital Stay (HOSPITAL_BASED_OUTPATIENT_CLINIC_OR_DEPARTMENT_OTHER): Payer: Medicaid Other | Admitting: Physician Assistant

## 2021-02-23 ENCOUNTER — Other Ambulatory Visit: Payer: Self-pay

## 2021-02-23 VITALS — BP 149/87 | HR 75 | Temp 98.3°F | Resp 17 | Wt 260.5 lb

## 2021-02-23 DIAGNOSIS — C3492 Malignant neoplasm of unspecified part of left bronchus or lung: Secondary | ICD-10-CM

## 2021-02-23 DIAGNOSIS — Z5111 Encounter for antineoplastic chemotherapy: Secondary | ICD-10-CM | POA: Diagnosis not present

## 2021-02-23 DIAGNOSIS — H9193 Unspecified hearing loss, bilateral: Secondary | ICD-10-CM

## 2021-02-23 DIAGNOSIS — C3412 Malignant neoplasm of upper lobe, left bronchus or lung: Secondary | ICD-10-CM

## 2021-02-23 DIAGNOSIS — I159 Secondary hypertension, unspecified: Secondary | ICD-10-CM

## 2021-02-23 LAB — CBC WITH DIFFERENTIAL (CANCER CENTER ONLY)
Abs Immature Granulocytes: 0.01 10*3/uL (ref 0.00–0.07)
Basophils Absolute: 0 10*3/uL (ref 0.0–0.1)
Basophils Relative: 0 %
Eosinophils Absolute: 0 10*3/uL (ref 0.0–0.5)
Eosinophils Relative: 0 %
HCT: 37 % — ABNORMAL LOW (ref 39.0–52.0)
Hemoglobin: 12 g/dL — ABNORMAL LOW (ref 13.0–17.0)
Immature Granulocytes: 0 %
Lymphocytes Relative: 10 %
Lymphs Abs: 0.7 10*3/uL (ref 0.7–4.0)
MCH: 30.4 pg (ref 26.0–34.0)
MCHC: 32.4 g/dL (ref 30.0–36.0)
MCV: 93.7 fL (ref 80.0–100.0)
Monocytes Absolute: 0.3 10*3/uL (ref 0.1–1.0)
Monocytes Relative: 4 %
Neutro Abs: 6.2 10*3/uL (ref 1.7–7.7)
Neutrophils Relative %: 86 %
Platelet Count: 194 10*3/uL (ref 150–400)
RBC: 3.95 MIL/uL — ABNORMAL LOW (ref 4.22–5.81)
RDW: 12.6 % (ref 11.5–15.5)
WBC Count: 7.2 10*3/uL (ref 4.0–10.5)
nRBC: 0 % (ref 0.0–0.2)

## 2021-02-23 LAB — CMP (CANCER CENTER ONLY)
ALT: 12 U/L (ref 0–44)
AST: 18 U/L (ref 15–41)
Albumin: 4 g/dL (ref 3.5–5.0)
Alkaline Phosphatase: 103 U/L (ref 38–126)
Anion gap: 6 (ref 5–15)
BUN: 17 mg/dL (ref 6–20)
CO2: 27 mmol/L (ref 22–32)
Calcium: 9.8 mg/dL (ref 8.9–10.3)
Chloride: 107 mmol/L (ref 98–111)
Creatinine: 0.85 mg/dL (ref 0.61–1.24)
GFR, Estimated: >60 mL/min (ref >60)
Glucose, Bld: 140 mg/dL — ABNORMAL HIGH (ref 70–99)
Potassium: 3.8 mmol/L (ref 3.5–5.1)
Sodium: 140 mmol/L (ref 135–145)
Total Bilirubin: 0.4 mg/dL (ref 0.3–1.2)
Total Protein: 8 g/dL (ref 6.5–8.1)

## 2021-02-23 LAB — TOTAL PROTEIN, URINE DIPSTICK: Protein, ur: <30 mg/dL — AB

## 2021-02-23 MED ORDER — SODIUM CHLORIDE 0.9 % IV SOLN
75.0000 mg/m2 | Freq: Once | INTRAVENOUS | Status: AC
  Administered 2021-02-23: 190 mg via INTRAVENOUS
  Filled 2021-02-23: qty 19

## 2021-02-23 MED ORDER — DIPHENHYDRAMINE HCL 50 MG/ML IJ SOLN
50.0000 mg | Freq: Once | INTRAMUSCULAR | Status: AC
  Administered 2021-02-23: 50 mg via INTRAVENOUS
  Filled 2021-02-23: qty 1

## 2021-02-23 MED ORDER — SODIUM CHLORIDE 0.9 % IV SOLN
10.0000 mg | Freq: Once | INTRAVENOUS | Status: AC
  Administered 2021-02-23: 10 mg via INTRAVENOUS
  Filled 2021-02-23: qty 10

## 2021-02-23 MED ORDER — SODIUM CHLORIDE 0.9 % IV SOLN
10.0000 mg/kg | Freq: Once | INTRAVENOUS | Status: AC
  Administered 2021-02-23: 1200 mg via INTRAVENOUS
  Filled 2021-02-23: qty 100

## 2021-02-23 MED ORDER — ACETAMINOPHEN 325 MG PO TABS
650.0000 mg | ORAL_TABLET | Freq: Once | ORAL | Status: AC
  Administered 2021-02-23: 650 mg via ORAL
  Filled 2021-02-23: qty 2

## 2021-02-23 MED ORDER — SODIUM CHLORIDE 0.9 % IV SOLN: Freq: Once | INTRAVENOUS | Status: AC

## 2021-02-23 NOTE — Progress Notes (Signed)
[facility] Spiritual Care Note  Followed up with Mr Moffa in infusion. He is feeling discouraged at his hearing loss in what feels like a series of difficulties/complications, but he is trying to keep his spirits up with gratitude and positive self-talk. He values Spiritual Care as part of his support team and welcomes follow-up emotional support, empathic listening, and pastoral reflection. We plan to connect by phone to check in next week.   [place], [place], [facility] Pager [phone] Voicemail [phone]

## 2021-02-23 NOTE — Telephone Encounter (Signed)
Registration called me . Raymond [name] told her he cannot hear and this is a new problem. His last chemo was 01/04. Message sent to [facility] re this problem as pt just completed brain irradiation. Worthy Flank, and Cassie aware. Cassie will see pt  in infusion.

## 2021-02-23 NOTE — Progress Notes (Signed)
I was asked to see this patient in the infusion room. He was reporting decreased hearing bilaterally for approximately 1 week. He estimates this started on 02/17/21. He states his hearing is muffled. He states this started gradually. Denies recent head trauma or barotrauma. He did complete Augmentin for diverticulitis which he completed on 02/03/21.  He is reporting brown drainage from his ear last week.  Denies any maxillary sinus pressure.  He occasionally has some mild frontal sinus discomfort.  He gets occasional headaches for which he takes Tylenol.  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  He reports that sometimes he has bilateral visual blurring but it resolved spontaneously.  On exam, no significant cerumen impaction.  No external auditory canal erythema or swelling.  No drainage.  His right tympanic membrane is opaque/cloudy appearing.  Cannot appreciate bony landmarks.  No erythema.  His left tympanic membrane also appears abnormal.  Discussed with radiation oncology.  They discussed that sometimes radiation may cause mastoid effusions, in that case, they recommended decongestants for 2 weeks.  Discussed with the patient that he can try Mucinex and Sudafed, however, discussed that Sudafed may raise his blood pressure so it is important to be compliant with his amlodipine.  Additionally, I will refer the patient to ENT for further evaluation and management.

## 2021-02-23 NOTE — Patient Instructions (Addendum)
[facility]  Discharge Instructions: Thank you for choosing [facility] to provide your oncology and hematology care.   If you have a lab appointment with the [facility], please go directly to the [facility] and check in at the registration area.   Wear comfortable clothing and clothing appropriate for easy access to any Portacath or PICC line.   We strive to give you quality time with your provider. You may need to reschedule your appointment if you arrive late (15 or more minutes).  Arriving late affects you and other patients whose appointments are after yours.  Also, if you miss three or more appointments without notifying the office, you may be dismissed from the clinic at the providers discretion.      For prescription refill requests, have your pharmacy contact our office and allow 72 hours for refills to be completed.    Today you received the following chemotherapy and/or immunotherapy agents: Cyramza and  Taxotere   To help prevent nausea and vomiting after your treatment, we encourage you to take your nausea medication as directed.  BELOW ARE SYMPTOMS THAT SHOULD BE REPORTED IMMEDIATELY: *FEVER GREATER THAN 100.4 F (38 C) OR HIGHER *CHILLS OR SWEATING *NAUSEA AND VOMITING THAT IS NOT CONTROLLED WITH YOUR NAUSEA MEDICATION *UNUSUAL SHORTNESS OF BREATH *UNUSUAL BRUISING OR BLEEDING *URINARY PROBLEMS (pain or burning when urinating, or frequent urination) *BOWEL PROBLEMS (unusual diarrhea, constipation, pain near the anus) TENDERNESS IN MOUTH AND THROAT WITH OR WITHOUT PRESENCE OF ULCERS (sore throat, sores in mouth, or a toothache) UNUSUAL RASH, SWELLING OR PAIN  UNUSUAL VAGINAL DISCHARGE OR ITCHING   Items with * indicate a potential emergency and should be followed up as soon as possible or go to the Emergency Department if any problems should occur.  Please show the CHEMOTHERAPY ALERT CARD or IMMUNOTHERAPY ALERT CARD at  check-in to the Emergency Department and triage nurse.  Should you have questions after your visit or need to cancel or reschedule your appointment, please contact [facility]  Dept: [phone]  and follow the prompts.  Office hours are 8:00 a.m. to 4:30 p.m. Monday - Friday. Please note that voicemails left after 4:00 p.m. may not be returned until the following business day.  We are closed weekends and major holidays. You have access to a nurse at all times for urgent questions. Please call the main number to the clinic Dept: [phone] and follow the prompts.   For any non-urgent questions, you may also contact your provider using MyChart. We now offer e-Visits for anyone 53 and older to request care online for non-urgent symptoms. For details visit [email or web address].   Also download the MyChart app! Go to the app store, search "MyChart", open the app, select [facility], and log in with your MyChart username and password.  Due to Covid, a mask is required upon entering the hospital/clinic. If you do not have a mask, one will be given to you upon arrival. For doctor visits, patients may have 1 support person aged 67 or older with them. For treatment visits, patients cannot have anyone with them due to current Covid guidelines and our immunocompromised population.   Ramucirumab injection What is this medication? RAMUCIRUMAB (ra mue SIR ue mab) is a monoclonal antibody. It is used to treat stomach cancer, colorectal cancer, liver cancer, and lung cancer. This medicine may be used for other purposes; ask your health care provider or pharmacist if you have questions.  COMMON BRAND NAME(S): Cyramza What should I tell my care team before I take this medication? They need to know if you have any of these conditions: bleeding disorders blood clots heart disease, including heart failure, heart attack, or chest pain (angina) high blood pressure infection  (especially a virus infection such as chickenpox, cold sores, or herpes) protein in your urine recent or planning to have surgery stroke an unusual or allergic reaction to ramucirumab, other medicines, foods, dyes, or preservatives pregnant or trying to get pregnant breast-feeding How should I use this medication? This medicine is for infusion into a vein. It is given by a health care professional in a hospital or clinic setting. Talk to your pediatrician regarding the use of this medicine in children. Special care may be needed. Overdosage: If you think you have taken too much of this medicine contact a poison control center or emergency room at once. NOTE: This medicine is only for you. Do not share this medicine with others. What if I miss a dose? It is important not to miss your dose. Call your doctor or health care professional if you are unable to keep an appointment. What may interact with this medication? Interactions have not been studied. This list may not describe all possible interactions. Give your health care provider a list of all the medicines, herbs, non-prescription drugs, or dietary supplements you use. Also tell them if you smoke, drink alcohol, or use illegal drugs. Some items may interact with your medicine. What should I watch for while using this medication? Your condition will be monitored carefully while you are receiving this medicine. You will need to to check your blood pressure and have your blood and urine tested while you are taking this medicine. Your condition will be monitored carefully while you are receiving this medicine. This medicine may increase your risk to bruise or bleed. Call your doctor or health care professional if you notice any unusual bleeding. Before having surgery, talk to your health care provider to make sure it is ok. This drug can increase the risk of poor healing of your surgical site or wound. You will need to stop this drug for 28 days  before surgery. After surgery, wait at least 2 weeks before restarting this drug. Make sure the surgical site or wound is healed enough before restarting this drug. Talk to your health care provider if questions. Do not become pregnant while taking this medicine or for 3 months after stopping it. Women should inform their doctor if they wish to become pregnant or think they might be pregnant. There is a potential for serious side effects to an unborn child. Talk to your health care professional or pharmacist for more information. Do not breast-feed an infant while taking this medicine or for 2 months after stopping it. This medicine may interfere with the ability to have a child. Talk with your doctor or health care professional if you are concerned about your fertility. What side effects may I notice from receiving this medication? Side effects that you should report to your doctor or health care professional as soon as possible: allergic reactions like skin rash, itching or hives, breathing problems, swelling of the face, lips, or tongue signs of infection - fever or chills, cough, sore throat chest pain or chest tightness confusion dizziness feeling faint or lightheaded, falls severe abdominal pain severe nausea, vomiting signs and symptoms of bleeding such as bloody or black, tarry stools; red or dark-brown urine; spitting up blood or brown  material that looks like coffee grounds; red spots on the skin; unusual bruising or bleeding from the eye, gums, or nose signs and symptoms of a blood clot such as breathing problems; changes in vision; chest pain; severe, sudden headache; pain, swelling, warmth in the leg; trouble speaking; sudden numbness or weakness of the face, arm or leg symptoms of a stroke: change in mental awareness, inability to talk or move one side of the body trouble walking, dizziness, loss of balance or coordination Side effects that usually do not require medical attention  (report to your doctor or health care professional if they continue or are bothersome): cold, clammy skin constipation diarrhea headache nausea, vomiting stomach pain unusually slow heartbeat unusually weak or tired This list may not describe all possible side effects. Call your doctor for medical advice about side effects. You may report side effects to FDA at 1-800-FDA-1088. Where should I keep my medication? This drug is given in a hospital or clinic and will not be stored at home. NOTE: This sheet is a summary. It may not cover all possible information. If you have questions about this medicine, talk to your doctor, pharmacist, or health care provider.  2022 Elsevier/Gold Standard (2020-09-08 00:00:00)  Docetaxel injection What is this medication? DOCETAXEL (doe se TAX el) is a chemotherapy drug. It targets fast dividing cells, like cancer cells, and causes these cells to die. This medicine is used to treat many types of cancers like breast cancer, certain stomach cancers, head and neck cancer, lung cancer, and prostate cancer. This medicine may be used for other purposes; ask your health care provider or pharmacist if you have questions. COMMON BRAND NAME(S): Docefrez, Taxotere What should I tell my care team before I take this medication? They need to know if you have any of these conditions: infection (especially a virus infection such as chickenpox, cold sores, or herpes) liver disease low blood counts, like low white cell, platelet, or red cell counts an unusual or allergic reaction to docetaxel, polysorbate 80, other chemotherapy agents, other medicines, foods, dyes, or preservatives pregnant or trying to get pregnant breast-feeding How should I use this medication? This drug is given as an infusion into a vein. It is administered in a hospital or clinic by a specially trained health care professional. Talk to your pediatrician regarding the use of this medicine in children.  Special care may be needed. Overdosage: If you think you have taken too much of this medicine contact a poison control center or emergency room at once. NOTE: This medicine is only for you. Do not share this medicine with others. What if I miss a dose? It is important not to miss your dose. Call your doctor or health care professional if you are unable to keep an appointment. What may interact with this medication? Do not take this medicine with any of the following medications: live virus vaccines This medicine may also interact with the following medications: aprepitant certain antibiotics like erythromycin or clarithromycin certain antivirals for HIV or hepatitis certain medicines for fungal infections like fluconazole, itraconazole, ketoconazole, posaconazole, or voriconazole cimetidine ciprofloxacin conivaptan cyclosporine dronedarone fluvoxamine grapefruit juice imatinib verapamil This list may not describe all possible interactions. Give your health care provider a list of all the medicines, herbs, non-prescription drugs, or dietary supplements you use. Also tell them if you smoke, drink alcohol, or use illegal drugs. Some items may interact with your medicine. What should I watch for while using this medication? Your condition will be monitored  carefully while you are receiving this medicine. You will need important blood work done while you are taking this medicine. Call your doctor or health care professional for advice if you get a fever, chills or sore throat, or other symptoms of a cold or flu. Do not treat yourself. This drug decreases your body's ability to fight infections. Try to avoid being around people who are sick. Some products may contain alcohol. Ask your health care professional if this medicine contains alcohol. Be sure to tell all health care professionals you are taking this medicine. Certain medicines, like metronidazole and disulfiram, can cause an unpleasant  reaction when taken with alcohol. The reaction includes flushing, headache, nausea, vomiting, sweating, and increased thirst. The reaction can last from 30 minutes to several hours. You may get drowsy or dizzy. Do not drive, use machinery, or do anything that needs mental alertness until you know how this medicine affects you. Do not stand or sit up quickly, especially if you are an older patient. This reduces the risk of dizzy or fainting spells. Alcohol may interfere with the effect of this medicine. Talk to your health care professional about your risk of cancer. You may be more at risk for certain types of cancer if you take this medicine. Do not become pregnant while taking this medicine or for 6 months after stopping it. Women should inform their doctor if they wish to become pregnant or think they might be pregnant. There is a potential for serious side effects to an unborn child. Talk to your health care professional or pharmacist for more information. Do not breast-feed an infant while taking this medicine or for 1 week after stopping it. Males who get this medicine must use a condom during sex with females who can get pregnant. If you get a woman pregnant, the baby could have birth defects. The baby could die before they are born. You will need to continue wearing a condom for 3 months after stopping the medicine. Tell your health care provider right away if your partner becomes pregnant while you are taking this medicine. This may interfere with the ability to father a child. You should talk to your doctor or health care professional if you are concerned about your fertility. What side effects may I notice from receiving this medication? Side effects that you should report to your doctor or health care professional as soon as possible: allergic reactions like skin rash, itching or hives, swelling of the face, lips, or tongue blurred vision breathing problems changes in vision low blood counts  - This drug may decrease the number of white blood cells, red blood cells and platelets. You may be at increased risk for infections and bleeding. nausea and vomiting pain, redness or irritation at site where injected pain, tingling, numbness in the hands or feet redness, blistering, peeling, or loosening of the skin, including inside the mouth signs of decreased platelets or bleeding - bruising, pinpoint red spots on the skin, black, tarry stools, nosebleeds signs of decreased red blood cells - unusually weak or tired, fainting spells, lightheadedness signs of infection - fever or chills, cough, sore throat, pain or difficulty passing urine swelling of the ankle, feet, hands Side effects that usually do not require medical attention (report to your doctor or health care professional if they continue or are bothersome): constipation diarrhea fingernail or toenail changes hair loss loss of appetite mouth sores muscle pain This list may not describe all possible side effects. Call your doctor for  medical advice about side effects. You may report side effects to FDA at 1-800-FDA-1088. Where should I keep my medication? This drug is given in a hospital or clinic and will not be stored at home. NOTE: This sheet is a summary. It may not cover all possible information. If you have questions about this medicine, talk to your doctor, pharmacist, or health care provider.  2022 Elsevier/Gold Standard (2020-09-08 00:00:00)

## 2021-02-24 ENCOUNTER — Ambulatory Visit: Payer: 59

## 2021-02-24 ENCOUNTER — Telehealth: Payer: Self-pay | Admitting: *Deleted

## 2021-02-24 ENCOUNTER — Encounter: Payer: Self-pay | Admitting: Internal Medicine

## 2021-02-24 ENCOUNTER — Encounter: Payer: Self-pay | Admitting: *Deleted

## 2021-02-24 ENCOUNTER — Other Ambulatory Visit: Payer: Self-pay

## 2021-02-24 ENCOUNTER — Other Ambulatory Visit (HOSPITAL_COMMUNITY): Payer: Self-pay

## 2021-02-24 ENCOUNTER — Telehealth: Payer: Self-pay

## 2021-02-24 NOTE — Progress Notes (Signed)
[facility] Social Work Progress Notes  Social work Theatre manager spoke with Mr. Obrecht over the phone, as follow up from call on 02/10/21. Intern and pt were unable to meet as planned on 2/13, and plan to meet instead on Friday 02/26/21 at 12:30pm in support service office. Mr. Grey expressed concern re SSDI application status. Intern spoke with Elmo Putt at [facility] and confirmed that application was submitted on 02/16/21, and that even being expedited it will take at least 3-4 weeks to process. Intern communicated this to pt. Mr. Boardley reports no other concerns at this time.  Rosary Lively, Social Work Intern Supervised by Gwinda Maine, LCSW

## 2021-02-24 NOTE — Telephone Encounter (Signed)
Called pt to see how he did with his treatment.  He reports doing well but tired this eve & is leaving work.  He denies any other problems.  Discussed claritin/tylenol use for pegfilgrastim due tomorrow.  He states his ears are the same.  He hasn't started sudafed.  Encourage him to try it.  He states he knows how to reach [place] if needed.

## 2021-02-24 NOTE — Telephone Encounter (Signed)
-----   Message from Charleston Poot, RN sent at 02/23/2021  4:19 PM EST ----- Regarding: First Time/ Cyramza and Taxotere / Dr Julien Nordmann pt Hello,  Pt had first time Cyramza and Taxotere today. Tolerated treatment well. Only used ice intermittently during infusion. Has been complaining of ear pain, MD aware and made ENT referral.   Thank you! Libbie K.

## 2021-02-24 NOTE — Telephone Encounter (Signed)
Mr. Honaker wife called our office to ask about the Advance Directive clinic dates and times to fit them with her schedule. She said she was available after 2pm on Mondays, so I asked her if March 13th at 2:30 would work and she said yes. I called the Advance Directive clinic number and had this scheduled for them. All questions answered.

## 2021-02-25 ENCOUNTER — Ambulatory Visit: Payer: 59

## 2021-02-25 ENCOUNTER — Other Ambulatory Visit: Payer: Self-pay

## 2021-02-25 ENCOUNTER — Inpatient Hospital Stay: Payer: Medicaid Other | Admitting: Dietician

## 2021-02-25 ENCOUNTER — Inpatient Hospital Stay (HOSPITAL_BASED_OUTPATIENT_CLINIC_OR_DEPARTMENT_OTHER): Payer: Medicaid Other | Admitting: Nurse Practitioner

## 2021-02-25 ENCOUNTER — Ambulatory Visit (INDEPENDENT_AMBULATORY_CARE_PROVIDER_SITE_OTHER): Payer: 59 | Admitting: Psychologist

## 2021-02-25 ENCOUNTER — Other Ambulatory Visit (HOSPITAL_COMMUNITY): Payer: Self-pay

## 2021-02-25 ENCOUNTER — Encounter: Payer: Self-pay | Admitting: Nurse Practitioner

## 2021-02-25 ENCOUNTER — Inpatient Hospital Stay: Payer: Medicaid Other

## 2021-02-25 VITALS — BP 129/77 | HR 87 | Temp 98.6°F | Resp 17 | Wt 267.0 lb

## 2021-02-25 VITALS — BP 133/78 | HR 99 | Resp 18

## 2021-02-25 DIAGNOSIS — F330 Major depressive disorder, recurrent, mild: Secondary | ICD-10-CM

## 2021-02-25 DIAGNOSIS — C3412 Malignant neoplasm of upper lobe, left bronchus or lung: Secondary | ICD-10-CM

## 2021-02-25 DIAGNOSIS — R530 Neoplastic (malignant) related fatigue: Secondary | ICD-10-CM

## 2021-02-25 DIAGNOSIS — R630 Anorexia: Secondary | ICD-10-CM

## 2021-02-25 DIAGNOSIS — R110 Nausea: Secondary | ICD-10-CM

## 2021-02-25 DIAGNOSIS — Z5111 Encounter for antineoplastic chemotherapy: Secondary | ICD-10-CM | POA: Diagnosis not present

## 2021-02-25 DIAGNOSIS — Z515 Encounter for palliative care: Secondary | ICD-10-CM | POA: Diagnosis not present

## 2021-02-25 DIAGNOSIS — C3492 Malignant neoplasm of unspecified part of left bronchus or lung: Secondary | ICD-10-CM

## 2021-02-25 DIAGNOSIS — F411 Generalized anxiety disorder: Secondary | ICD-10-CM | POA: Diagnosis not present

## 2021-02-25 DIAGNOSIS — G893 Neoplasm related pain (acute) (chronic): Secondary | ICD-10-CM

## 2021-02-25 DIAGNOSIS — K5900 Constipation, unspecified: Secondary | ICD-10-CM

## 2021-02-25 DIAGNOSIS — Z7189 Other specified counseling: Secondary | ICD-10-CM

## 2021-02-25 MED ORDER — ONDANSETRON HCL 8 MG PO TABS
8.0000 mg | ORAL_TABLET | Freq: Three times a day (TID) | ORAL | 2 refills | Status: DC | PRN
  Filled 2021-02-25: qty 21, 30d supply, fill #0
  Filled 2021-04-08: qty 21, 30d supply, fill #1

## 2021-02-25 MED ORDER — PEGFILGRASTIM-CBQV 6 MG/0.6ML ~~LOC~~ SOSY
6.0000 mg | PREFILLED_SYRINGE | Freq: Once | SUBCUTANEOUS | Status: AC
  Administered 2021-02-25: 6 mg via SUBCUTANEOUS
  Filled 2021-02-25: qty 0.6

## 2021-02-25 MED ORDER — OXYCODONE HCL 5 MG PO TABS
5.0000 mg | ORAL_TABLET | Freq: Four times a day (QID) | ORAL | 0 refills | Status: DC | PRN
Start: 2021-02-25 — End: 2021-03-16
  Filled 2021-02-25: qty 60, 15d supply, fill #0

## 2021-02-25 MED ORDER — PROCHLORPERAZINE MALEATE 10 MG PO TABS
10.0000 mg | ORAL_TABLET | Freq: Four times a day (QID) | ORAL | 2 refills | Status: DC | PRN
  Filled 2021-02-25: qty 90, 23d supply, fill #0

## 2021-02-25 NOTE — Patient Instructions (Signed)

## 2021-02-25 NOTE — Progress Notes (Signed)
[place]  Telephone:([phone]) [phone] [phone]) [phone]   Name: Raymond [name] Date: 02/25/2021 MRN: [id number]  DOB: [birth year]  Patient Care Team: Pcp, No as PCP - General Valrie Hart, RN as Oncology Nurse Navigator (Oncology)    REASON FOR CONSULTATION: Raymond [name] is a 61 y.o. male with medical history including stage IV non-small cell lung cancer, adenocarcinoma of the left lower lobe, mediastinal lymphadenopathy with brain metastasis, s/p 5 cycles of carboplatin, Keytruda, and Alimta, stereotactic radiosurgery to brain mets, hypertension, and GERD.  Recent MRI on (01/21/21) showed significant increase in enhancing metastatic cerebral and cerebellar hemisphere lesions. Now undergoing a total of 10 whole brain radiation treatments. Palliative ask to see for ongoing symptom management support and goals of care.    SOCIAL HISTORY:     reports that Raymond [name] has been smoking cigarettes. Raymond [name] has never used smokeless tobacco. Raymond [name] reports that Raymond [name] does not drink alcohol and does not use drugs.  ADVANCE DIRECTIVES:  None on file. Education provided. Raymond [name] aware his wife, Raymond [name] is his medical decision maker in the setting of no document. Raymond [name] was given an advanced directive packet with plans to complete.   CODE STATUS: Full code   ALLERGIES:  is allergic to benadryl [diphenhydramine].  MEDICATIONS:  Current Outpatient Medications  Medication Sig Dispense Refill   ondansetron (ZOFRAN) 8 MG tablet Take 1 tablet (8 mg total) by mouth every 8 (eight) hours as needed for nausea or vomiting. 60 tablet 2   acetaminophen (TYLENOL) 650 MG CR tablet Take 650 mg by mouth every 8 (eight) hours as needed for pain.     amLODipine (NORVASC) 5 MG tablet Take 1 tablet (5 mg total) by mouth daily. 30 tablet 0   dexamethasone (DECADRON) 4 MG tablet Please take 2 tablets 2 times a day the day before, the day of, and the day after treatment. 40 tablet 2   folic  acid (FOLVITE) 1 MG tablet Take 1 tablet (1 mg total) by mouth daily. 30 tablet 1   gabapentin (NEURONTIN) 300 MG capsule Take 1 capsule (300 mg total) by mouth at bedtime. 30 capsule 0   memantine (NAMENDA) 10 MG tablet Take 1 tablet (10 mg total) by mouth 2 (two) times daily. 60 tablet 4   memantine (NAMENDA) 5 MG tablet Begin this prescription the first day of brain radiation. Week 1: Take 1 tablet by mouth every morning. Week 2: Take 1 tablet every morning and evening. Week 3: Take 2 tablets every morning, and 1 tablet in the evening. Week 4: Take 2 tablets in the morning and evening. Fill subsequent prescription each month. 70 tablet 0   mirtazapine (REMERON) 30 MG tablet Take 1 tablet (30 mg total) by mouth at bedtime. 30 tablet 0   Misc Natural Products (OSTEO BI-FLEX ADV JOINT SHIELD) TABS Take 1 tablet by mouth daily.     oxyCODONE (OXY IR/ROXICODONE) 5 MG immediate release tablet Take 1 tablet (5 mg total) by mouth every 6 (six) hours as needed for severe pain. 60 tablet 0   prochlorperazine (COMPAZINE) 10 MG tablet Take 1 tablet (10 mg total) by mouth every 6 (six) hours as needed for nausea or vomiting. 90 tablet 2   simethicone (MYLICON) 440 MG chewable tablet Chew 125 mg by mouth every 6 (six) hours as needed for flatulence.     tamsulosin (FLOMAX) 0.4 MG CAPS capsule Take 1 capsule (0.4 mg total) by mouth daily after supper.  30 capsule 0   Wheat Dextrin (BENEFIBER DRINK MIX) PACK Take 1 Package by mouth daily at 6 (six) AM.     No current facility-administered medications for this visit.    VITAL SIGNS: BP 129/77 (BP Location: Left Arm, Patient Position: Sitting)    Pulse 87    Temp 98.6 F (37 C) (Oral)    Resp 17    Wt 267 lb (121.1 kg)    SpO2 97%    BMI 37.24 kg/m  Filed Weights   02/25/21 1112  Weight: 267 lb (121.1 kg)     Estimated body mass index is 37.24 kg/m as calculated from the following:   Height as of 02/08/21: 5\' 11"  (1.803 m).   Weight as of this encounter:  267 lb (121.1 kg).   PERFORMANCE STATUS (ECOG) : 1 - Symptomatic but completely ambulatory  Physical Exam General: NAD, ambulatory  Cardiovascular: regular rate and rhythm Pulmonary: normal breathing pattern  Extremities: no edema, left leg weakness  Neurological: AAO x4, mood appropriate  IMPRESSION:  Raymond [name] presents today for follow-up. His wife is also with him for support.  No acute distress noted.  Appears comfortable.  Looks much better than previous visit.  Incision dressed in preparation to return to work after visit today.  Raymond [name] shares Raymond [name] has tolerated new treatment.  Raymond [name] actually went to work the day after and has continued to work every day except Sunday.  Continues to complain of pain which is manageable.  Raymond [name] does endorse ongoing fatigue along with some emotional distress regarding coping with his disease, health changes, and long-term prognosis   Pain related to neoplasm  Raymond [name] complains of ongoing bilateral leg pain and some generalized aches and pain. Raymond [name] is tolerating the Oxy IR and gabapentin. Feels pain is manageable since starting.  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  Raymond [name] tries his best to manage throughout the day and will then take pain medication once Raymond [name] is safely home.  We will continue to closely monitor and make needed adjustments.   Constipation  Improved with daily Miralax use. We discussed the importance of bowel regimen to prevent further complications and in addition to the use of opioids. Raymond [name] understands if no bowel movement in 24 hours to increase to twice daily. Encouraged water intake.   Weight loss/decreased appetite  Raymond [name] and his wife expressed ongoing concerns with decreased appetite.  Raymond [name] states his taste buds have changed causing certain foods do not taste the same.  Wife shares Raymond [name] will generally eat 1 meal a day and may be snack throughout.  Also will drink 1  Ensure daily.  Raymond [name] is scheduled to see the dietitian today.  Education provided on high-protein and high-calorie drinks.  Raymond [name] enjoys cookies and cream ice cream which Raymond [name] does eat daily.  Encouraged him to mix protein drink with his ice cream and make a milkshake allowing this to be more enjoyable while also getting the benefit of the protein.  Patient and wife verbalized understanding.  Raymond [name] does feel the mirtazapine him is helping some.  Acknowledges a lot of his decreased appetite may be related to ongoing nausea.  Weight on today is 267 pounds, 2/21 to 60.8 pounds, 2/13 268.4 lbs, 2/6 to 274 pounds.   4. Goals of Care    We discussed his ongoing disease and trajectory.  Raymond [name] shares Raymond [name] continues to remain hopeful for stability/improvement.  States  Raymond [name] is not willing to accept hospice at this time as Raymond [name] is not a "quitter".  His wife asked appropriate questions regarding future expectations. They are clear in expressed wishes to continue taking things day by day.   Raymond [name] and his wife requested for an appointment in the advanced directive clinic. Extensive education provided on advanced directives and each section. They verbalized appreciation.   Raymond [name] is remaining hopeful with realistic understanding of his condition and severity. Speaks to his strong Christian faith.   I discussed the importance of continued conversation with family and their medical providers regarding overall plan of care and treatment options, ensuring decisions are within the context of the patients values and GOCs.  PLAN: Advanced Directive education. Raymond [name] is scheduled for advanced directive clinic on March 13th.  Oxy IR 5 mg every 6 hours as needed for pain. Will continue to closely evaluate. Raymond [name] is only taking in the afternoon and night hours. Not so much during the day unless Raymond [name] is off of work.  Neurontin 300 mg nightly, will continue to evaluate and increase dose for added support.  Continues to follow with Dr. Nelida Meuse for  ongoing support.  Ongoing goals of care discussions.  I will plan to see patient back in 2-3 weeks in collaboration to other oncology appointments.    Patient expressed understanding and was in agreement with this plan. Raymond [name] also understands that Raymond [name] can call the clinic at any time with any questions, concerns, or complaints.   Time Total: 50 min.   Visit consisted of counseling and education dealing with the complex and emotionally intense issues of symptom management and palliative care in the setting of serious and potentially life-threatening illness.Greater than 50%  of this time was spent counseling and coordinating care related to the above assessment and plan.  [name], AGPCNP-BC

## 2021-02-25 NOTE — Progress Notes (Unsigned)
Nutrition Assessment   Reason for Assessment: MST   ASSESSMENT: 61 year old male progression of non-small cell lung cancer with osseous and brain metastasis. He is s/p whole brain radiation under the care of Dr. Lisbeth Renshaw, completed 02/10/21. Patient is pending start of chemotherapy with docetaxel + ramucirmab (first treatment planned 3/14). Patient is followed by Dr. Julien Nordmann.   Past medical history includes GERD, HTN, arthritis, diverticulitis   Met with patient and wife in clinic. Patient reports poor appetite. Patient is currently not taking appetite stimulant. He reports pill burden and unsure of when to take this. Patient reports this was discussed during visit with palliative care today. Patient reports altered taste. Everything taste salty. Patient reports smell of food makes him nauseous. He is drinking 2 Ensure Plus. He tolerates this well.   Nutrition Focused Physical Exam: deferred    Medications: Decadron, folvite, gabapentin, namenda, remeron, compazine, oxycodone   Labs: ***   Anthropometrics: ***  Height: *** Weight: *** UBW: *** BMI: ***   NUTRITION DIAGNOSIS: Inadequate oral intake related to cancer treatment side effects as evidenced by reported poor appetite, altered taste, nausea,    INTERVENTION:  Educated on small frequent meals and snacks with adequate calories and protein - provided suggestions and handouts provided Discussed strategies for nausea, foods to avoid and foods best tolerated - handout with tips provided Discussed strategies for altered taste - handout provided Suggested pt try baking soda salt water rinses before meal time - recipe provided Continue drinking Ensure Plus, recommend 2-3/day Strive for weight maintenance One complimentary case of Ensure Plus High Protein provided Contact information provided   MONITORING, EVALUATION, GOAL: Patient will tolerate increased calories and protein to minimize weight loss   Next Visit:  ***

## 2021-02-25 NOTE — Progress Notes (Signed)
                Cecilee Rosner, PsyD 

## 2021-02-25 NOTE — Plan of Care (Signed)

## 2021-02-25 NOTE — Progress Notes (Signed)
Media Counselor Initial Adult Exam  Name: Raymond [name] Date: 02/25/2021 MRN: [id number] DOB: 09-30-1960 PCP: Pcp, No  Time spent: 2:05 pm to 2:41 pm; total time: 36 minutes  This session was held via video webex teletherapy due to the coronavirus risk at this time. The patient consented to video teletherapy and was located at his home during this session. He is aware it is the responsibility of the patient to secure confidentiality on his end of the session. The provider was in a private home office for the duration of this session. Limits of confidentiality were discussed with the patient.   Guardian/Payee:  NA    Paperwork requested: No   Reason for Visit /Presenting Problem: Anxiety and depression  Mental Status Exam: Appearance:   Casual     Behavior:  Appropriate  Motor:  Normal  Speech/Language:   Clear and Coherent  Affect:  Appropriate  Mood:  normal  Thought process:  normal  Thought content:    WNL  Sensory/Perceptual disturbances:    WNL  Orientation:  oriented to person, place, and time/date  Attention:  Good  Concentration:  Good  Memory:  WNL  Fund of knowledge:   Good  Insight:    Good  Judgment:   Good  Impulse Control:  Good     Reported Symptoms:  The patient endorsed experiencing the following: feeling restless, feeling on edge, difficulty controlling worries, feeling overwhelmed by multiple worries, and feeling overwhelmed by his health. He denied suicidal and homicidal ideation.  The patient endorsed experiencing the following: feeling sad, down, tearful, social isolation, avoiding pleasurable activities, rumination of negative thoughts, and fatigue. He denied suicidal and homicidal ideation.   Risk Assessment: Danger to Self:  No Self-injurious Behavior: No Danger to Others: No Duty to Warn:no Physical Aggression / Violence:No  Access to Firearms a concern: No  Gang Involvement:No  Patient / guardian was educated about  steps to take if suicide or homicide risk level increases between visits: n/a While future psychiatric events cannot be accurately predicted, the patient does not currently require acute inpatient psychiatric care and does not currently meet [facility] involuntary commitment criteria.  Substance Abuse History: Current substance abuse: No     Past Psychiatric History:   No previous psychological problems have been observed Outpatient Providers:NA History of Psych Hospitalization: No  Psychological Testing:  NA    Abuse History:  Victim of: No.,  NA    Report needed: No. Victim of Neglect:No. Perpetrator of  NA   Witness / Exposure to Domestic Violence: No   Protective Services Involvement: No  Witness to [employer] Violence:  No   Family History:  Family History  Problem Relation Age of Onset   Lung cancer Mother    Heart failure Sister     Living situation: the patient lives with their spouse  Sexual Orientation: Straight  Relationship Status: married  Name of spouse / other:Raymond [name] If a parent, number of children / ages:NA  Support Systems: spouse  Museum/gallery curator Stress:  No   Income/Employment/Disability: Employment  Armed forces logistics/support/administrative officer: No   Educational History: Education: high school diploma/GED  Religion/Sprituality/World View: Patient identified as Engineer, manufacturing.   Any cultural differences that may affect / interfere with treatment:  NA  Recreation/Hobbies: Spending time with his wife.   Stressors: Health problems    Strengths: Supportive Relationships  Barriers:  NA   Legal History: Pending legal issue / charges: The patient has been involved with the police as a result  of drug use. History of legal issue / charges: Drug Charges  Medical History/Surgical History: reviewed Past Medical History:  Diagnosis Date   Arthritis    Baker's cyst    right   Diverticulitis    GERD (gastroesophageal reflux disease)    Heart attack ([place]) 01/04/2004   History  of hiatal hernia    Hypertension    Malignant neoplasm of upper lobe of left lung ([place]) 09/24/2020   Pneumonia    as a child   Sleep apnea    no longer on Cpap    Past Surgical History:  Procedure Laterality Date   BIOPSY  01/26/2021   Procedure: BIOPSY;  Surgeon: Ronnette Juniper, MD;  Location: [facility] ENDOSCOPY;  Service: Gastroenterology;;   BRONCHIAL BIOPSY  09/14/2020   Procedure: BRONCHIAL BIOPSIES;  Surgeon: Collene Gobble, MD;  Location[facility] ENDOSCOPY;  Service: Cardiopulmonary;;   BRONCHIAL BRUSHINGS  09/14/2020   Procedure: BRONCHIAL BRUSHINGS;  Surgeon: Collene Gobble, MD;  Location: [place];  Service: Cardiopulmonary;;   BRONCHIAL NEEDLE ASPIRATION BIOPSY  09/14/2020   Procedure: BRONCHIAL NEEDLE ASPIRATION BIOPSIES;  Surgeon: Collene Gobble, MD;  Location: [place];  Service: Cardiopulmonary;;   CARDIAC CATHETERIZATION     CARPAL TUNNEL RELEASE     right   COLONOSCOPY WITH PROPOFOL N/A 01/26/2021   Procedure: COLONOSCOPY WITH PROPOFOL;  Surgeon: Ronnette Juniper, MD;  Location: [facility] ENDOSCOPY;  Service: Gastroenterology;  Laterality: N/A;   FIDUCIAL MARKER PLACEMENT  09/14/2020   Procedure: FIDUCIAL MARKER PLACEMENT;  Surgeon: Collene Gobble, MD;  Location[facility] ENDOSCOPY;  Service: Cardiopulmonary;;   HAND SURGERY     left   KNEE SURGERY     left   VIDEO BRONCHOSCOPY N/A 09/14/2020   Procedure: ROBOTIC VIDEO BRONCHOSCOPY WITH FLUORO;  Surgeon: Collene Gobble, MD;  Location: [place];  Service: Cardiopulmonary;  Laterality: N/A;   VIDEO BRONCHOSCOPY WITH ENDOBRONCHIAL ULTRASOUND N/A 09/14/2020   Procedure: VIDEO BRONCHOSCOPY WITH ENDOBRONCHIAL ULTRASOUND;  Surgeon: Collene Gobble, MD;  Location: [place];  Service: Cardiopulmonary;  Laterality: N/A;   VIDEO BRONCHOSCOPY WITH RADIAL ENDOBRONCHIAL ULTRASOUND  09/14/2020   Procedure: VIDEO BRONCHOSCOPY WITH RADIAL ENDOBRONCHIAL ULTRASOUND;  Surgeon: Collene Gobble, MD;  Location: [facility] ENDOSCOPY;  Service: Cardiopulmonary;;     Medications: Current Outpatient Medications  Medication Sig Dispense Refill   acetaminophen (TYLENOL) 650 MG CR tablet Take 650 mg by mouth every 8 (eight) hours as needed for pain.     amLODipine (NORVASC) 5 MG tablet Take 1 tablet (5 mg total) by mouth daily. 30 tablet 0   dexamethasone (DECADRON) 4 MG tablet Please take 2 tablets 2 times a day the day before, the day of, and the day after treatment. 40 tablet 2   folic acid (FOLVITE) 1 MG tablet Take 1 tablet (1 mg total) by mouth daily. 30 tablet 1   gabapentin (NEURONTIN) 300 MG capsule Take 1 capsule (300 mg total) by mouth at bedtime. 30 capsule 0   memantine (NAMENDA) 10 MG tablet Take 1 tablet (10 mg total) by mouth 2 (two) times daily. 60 tablet 4   memantine (NAMENDA) 5 MG tablet Begin this prescription the first day of brain radiation. Week 1: Take 1 tablet by mouth every morning. Week 2: Take 1 tablet every morning and evening. Week 3: Take 2 tablets every morning, and 1 tablet in the evening. Week 4: Take 2 tablets in the morning and evening. Fill subsequent prescription each month. 70 tablet 0   mirtazapine (REMERON)  30 MG tablet Take 1 tablet (30 mg total) by mouth at bedtime. 30 tablet 0   Misc Natural Products (OSTEO BI-FLEX ADV JOINT SHIELD) TABS Take 1 tablet by mouth daily.     ondansetron (ZOFRAN) 8 MG tablet Take 1 tablet (8 mg total) by mouth every 8 (eight) hours as needed for nausea or vomiting. 60 tablet 2   oxyCODONE (OXY IR/ROXICODONE) 5 MG immediate release tablet Take 1 tablet (5 mg total) by mouth every 6 (six) hours as needed for severe pain. 60 tablet 0   prochlorperazine (COMPAZINE) 10 MG tablet Take 1 tablet (10 mg total) by mouth every 6 (six) hours as needed for nausea or vomiting. 90 tablet 2   simethicone (MYLICON) 916 MG chewable tablet Chew 125 mg by mouth every 6 (six) hours as needed for flatulence.     tamsulosin (FLOMAX) 0.4 MG CAPS capsule Take 1 capsule (0.4 mg total) by mouth daily after supper.  30 capsule 0   Wheat Dextrin (BENEFIBER DRINK MIX) PACK Take 1 Package by mouth daily at 6 (six) AM.     No current facility-administered medications for this visit.    Allergies  Allergen Reactions   Benadryl [Diphenhydramine] Itching    01/24/21 pt states this was a one time reaction and that he takes now when needed    Diagnoses:  F41.1 generalized anxiety disorder and F33.0 major depressive affective disorder, recurrent, mild  Plan of Care: The patient is a 61 year old 58 male who was referred to counseling due to experiencing anxiety and depressive symptoms secondary to experiencing metastatic cancer. The patient lives at home with his wife [name]). The patient meets criteria for a diagnosis of F41.1 generalized anxiety disorder based off of the following: feeling restless, feeling on edge, difficulty controlling worries, feeling overwhelmed by multiple worries, and feeling overwhelmed by his health. He denied suicidal and homicidal ideation. The patient also meets criteria for a diagnosis of F33.0 major depressive affective disorder, recurrent, mild based off of the following: feeling sad, down, tearful, social isolation, avoiding pleasurable activities, rumination of negative thoughts, and fatigue. He denied suicidal and homicidal ideation.   The patient stated that he wanted to process his emotions, to feel less stuck, to learn how to move forward and live with cancer without it defining him. He stated that he wants to enjoy life.   This psychologist makes the recommendation that the patient participate in bi-weekly therapy to assist him in meeting his goals.    Conception Chancy, PsyD

## 2021-02-26 ENCOUNTER — Encounter: Payer: Self-pay | Admitting: *Deleted

## 2021-02-26 ENCOUNTER — Other Ambulatory Visit: Payer: 59 | Admitting: Licensed Clinical Social Worker

## 2021-02-26 NOTE — Progress Notes (Signed)
[facility] Social Work Progress Notes  Social Work Theatre manager met with Mr. Covelli at [employer]. Intern provided empathy and active listening as she and the patient discussed the difficulty of adjusting to illness and treatment symptoms, need for support and rest, and healthy strategies for coping and self care. Mr. Siegmann expressed the burden he feels re financial concerns that have prevented him from making the decision to decrease hours at work. Pt's SSDI application has been submitted and is under review. Intern is scheduled to meet with community specialist from [place] on 03/01/21 to explore financial assistance options, and will continue to search for additional resources.  Mr. Heymann expressed appreciation for the visit, and SW Intern provided contact information and encouraged pt to reach out with any future needs.  Rosary Lively, Social Work Intern Supervised by Gwinda Maine, LCSW

## 2021-02-27 ENCOUNTER — Other Ambulatory Visit (HOSPITAL_COMMUNITY): Payer: Self-pay

## 2021-02-28 NOTE — Progress Notes (Addendum)
[facility] OFFICE PROGRESS NOTE  Pcp, No No address on file  DIAGNOSIS: Stage IV (T2b, N2, M1c) non-small cell lung cancer, adenocarcinoma presented with left upper lobe lung mass in addition to left hilar and mediastinal lymphadenopathy diagnosed in September 2022. He also has metastatic disease to the brain.    Molecular Studies: No actionable mutations   PRIOR THERAPY: 1) SRS to the metastatic brain lesions under the care of Dr. Lisbeth Renshaw Scheduled for 10/21/20 2) Whole brain radiation under the care of Dr. Lisbeth Renshaw. Completed on 02/10/21 3)  Carboplatin for an AUC of 5, Alimta 500 mg per metered squared, Keytruda 200 mg IV every 3 weeks. First dose on 10/12/20. Status post 5 cycles. Discontinued due to disease progression   CURRENT THERAPY: Docetaxel and Cyramaza IV every 3 weeks with neulasta support.   INTERVAL HISTORY: Raymond [name] 61 y.o. male returns to the clinic today for a follow-up visit.  The patient was last seen in the clinic on 02/15/2021.  Unfortunately, the patient's most recent restaging CT scan at that time showed evidence of disease progression.  Therefore, the patient's systemic treatment was changed to docetaxel and Cyramza.  The patient underwent his first cycle of treatment last week and he tolerated it fair.  He had significant joint pain and myalgias with getting the Neulasta injection.  The injection has not kicked in yet and the patient has some neutropenia on labs today.  The patient denies any fever or chills.  He denies any unusual diarrhea, skin infections, sore throat, sick contacts, or changes with his baseline shortness of breath and cough.   The patient was seen in the infusion room last week due to the chief complaint of bilateral hearing loss and right ear pain.  The patient's bilateral tympanic membranes looked abnormal and he was referred to ENT. He does not have an appointment with them at this time. Additionally, earlier this month, the patient  was found to have at least 28 new identified cerebellar and cerebral brain lesions for which she underwent whole brain radiation under the care of Dr. Lisbeth Renshaw.  This was completed on 02/11/2021. Ever since this time, he has been having headaches.  He reports his headaches are underneath his eyes that radiate to behind his jaw and ears.  He is tried taking Tylenol without any relief.  The patient is following closely with Dr. Michail Sermon from psychiatry due to his depression and anxiety and adjustment concerns from his cancer diagnosis.  The patient is also followed closely by palliative care for pain management.  He has been having some insomnia.  He has not picked up the Remeron that was prescribed on 02/15/2021.  Regarding his first cycle of his new systemic treatment last week, he tolerated it fair until he had the Neulasta injection.  He reports a mild cough due to phlegm being stuck in his throat.  For which he uses over-the-counter medications such as Mucinex.  He also has been using a decongestant as recommended by radiation oncology for his ears.  Denies any hemoptysis.  Denies any nausea or vomiting.  Denies any abnormal bleeding or bruising.  I recently referred him to a PCP to establish care.  He sometimes has tightness in his chest that feels like a band.  He had an EKG performed today which is normal.  Here today for evaluation 1 week follow-up visit to manage any adverse side effects of treatment.  MEDICAL HISTORY: Past Medical History:  Diagnosis Date  Arthritis    Baker's cyst    right   Diverticulitis    GERD (gastroesophageal reflux disease)    Heart attack ([place]) 01/04/2004   History of hiatal hernia    Hypertension    Malignant neoplasm of upper lobe of left lung ([facility]) 09/24/2020   Pneumonia    as a child   Sleep apnea    no longer on Cpap    ALLERGIES:  is allergic to benadryl [diphenhydramine].  MEDICATIONS:  Current Outpatient Medications  Medication Sig Dispense Refill    acetaminophen (TYLENOL) 650 MG CR tablet Take 650 mg by mouth every 8 (eight) hours as needed for pain.     amLODipine (NORVASC) 5 MG tablet Take 1 tablet (5 mg total) by mouth daily. 30 tablet 0   dexamethasone (DECADRON) 4 MG tablet Please take 2 tablets 2 times a day the day before, the day of, and the day after treatment. 40 tablet 2   folic acid (FOLVITE) 1 MG tablet Take 1 tablet (1 mg total) by mouth daily. 30 tablet 1   gabapentin (NEURONTIN) 300 MG capsule Take 1 capsule (300 mg total) by mouth at bedtime. 30 capsule 0   memantine (NAMENDA) 10 MG tablet Take 1 tablet (10 mg total) by mouth 2 (two) times daily. 60 tablet 4   memantine (NAMENDA) 5 MG tablet Begin this prescription the first day of brain radiation. Week 1: Take 1 tablet by mouth every morning. Week 2: Take 1 tablet every morning and evening. Week 3: Take 2 tablets every morning, and 1 tablet in the evening. Week 4: Take 2 tablets in the morning and evening. Fill subsequent prescription each month. 70 tablet 0   mirtazapine (REMERON) 30 MG tablet Take 1 tablet (30 mg total) by mouth at bedtime. 30 tablet 0   Misc Natural Products (OSTEO BI-FLEX ADV JOINT SHIELD) TABS Take 1 tablet by mouth daily.     ondansetron (ZOFRAN) 8 MG tablet Take 1 tablet (8 mg total) by mouth every 8 (eight) hours as needed for nausea or vomiting. 60 tablet 2   oxyCODONE (OXY IR/ROXICODONE) 5 MG immediate release tablet Take 1 tablet (5 mg total) by mouth every 6 (six) hours as needed for severe pain. 60 tablet 0   prochlorperazine (COMPAZINE) 10 MG tablet Take 1 tablet (10 mg total) by mouth every 6 (six) hours as needed for nausea or vomiting. 90 tablet 2   simethicone (MYLICON) 921 MG chewable tablet Chew 125 mg by mouth every 6 (six) hours as needed for flatulence.     tamsulosin (FLOMAX) 0.4 MG CAPS capsule Take 1 capsule (0.4 mg total) by mouth daily after supper. 30 capsule 0   Wheat Dextrin (BENEFIBER DRINK MIX) PACK Take 1 Package by mouth daily  at 6 (six) AM.     No current facility-administered medications for this visit.    SURGICAL HISTORY:  Past Surgical History:  Procedure Laterality Date   BIOPSY  01/26/2021   Procedure: BIOPSY;  Surgeon: Ronnette Juniper, MD;  Location: [facility];  Service: Gastroenterology;;   BRONCHIAL BIOPSY  09/14/2020   Procedure: BRONCHIAL BIOPSIES;  Surgeon: Collene Gobble, MD;  Location: [facility];  Service: Cardiopulmonary;;   BRONCHIAL BRUSHINGS  09/14/2020   Procedure: BRONCHIAL BRUSHINGS;  Surgeon: Collene Gobble, MD;  Location: [facility];  Service: Cardiopulmonary;;   BRONCHIAL NEEDLE ASPIRATION BIOPSY  09/14/2020   Procedure: BRONCHIAL NEEDLE ASPIRATION BIOPSIES;  Surgeon: Collene Gobble, MD;  Location: [facility];  Service: Cardiopulmonary;;  CARDIAC CATHETERIZATION     CARPAL TUNNEL RELEASE     right   COLONOSCOPY WITH PROPOFOL N/A 01/26/2021   Procedure: COLONOSCOPY WITH PROPOFOL;  Surgeon: Ronnette Juniper, MD;  Location: [facility];  Service: Gastroenterology;  Laterality: N/A;   FIDUCIAL MARKER PLACEMENT  09/14/2020   Procedure: FIDUCIAL MARKER PLACEMENT;  Surgeon: Collene Gobble, MD;  Location: [facility];  Service: Cardiopulmonary;;   HAND SURGERY     left   KNEE SURGERY     left   VIDEO BRONCHOSCOPY N/A 09/14/2020   Procedure: ROBOTIC VIDEO BRONCHOSCOPY WITH FLUORO;  Surgeon: Collene Gobble, MD;  Location: [facility];  Service: Cardiopulmonary;  Laterality: N/A;   VIDEO BRONCHOSCOPY WITH ENDOBRONCHIAL ULTRASOUND N/A 09/14/2020   Procedure: VIDEO BRONCHOSCOPY WITH ENDOBRONCHIAL ULTRASOUND;  Surgeon: Collene Gobble, MD;  Location: [facility];  Service: Cardiopulmonary;  Laterality: N/A;   VIDEO BRONCHOSCOPY WITH RADIAL ENDOBRONCHIAL ULTRASOUND  09/14/2020   Procedure: VIDEO BRONCHOSCOPY WITH RADIAL ENDOBRONCHIAL ULTRASOUND;  Surgeon: Collene Gobble, MD;  Location: [facility];  Service: Cardiopulmonary;;    REVIEW OF SYSTEMS:   Review of Systems  Constitutional:  Positive for fatigue and decreased appetite.  Negative for chills, fever and unexpected weight change.  HENT: Positive for bilateral hearing loss.  Negative for mouth sores, nosebleeds, sore throat and trouble swallowing.   Eyes: Negative for eye problems and icterus.  Respiratory: Positive for baseline dyspnea on exertion and mild cough.  Negative for hemoptysis and wheezing.   Cardiovascular: Positive for occasional bandlike sensation around his diaphragm.  Negative for leg swelling.  Gastrointestinal: Positive for occasional constipation.  Negative for abdominal pain, diarrhea, nausea and vomiting.  Genitourinary: Negative for bladder incontinence, difficulty urinating, dysuria, frequency and hematuria.   Musculoskeletal: Negative for back pain, gait problem, neck pain and neck stiffness.  Skin: Negative for itching and rash.  Neurological: Positive for headache and dizziness.  Negative for extremity weakness, gait problem, light-headedness and seizures.  Hematological: Negative for adenopathy. Does not bruise/bleed easily.  Psychiatric/Behavioral: Negative for confusion, depression and sleep disturbance. The patient is not nervous/anxious.     PHYSICAL EXAMINATION:  Blood pressure (!) 143/84, pulse 92, temperature 98.1 F (36.7 C), temperature source Tympanic, resp. rate 18, weight 260 lb 3 oz (118 kg), SpO2 98 %.  ECOG PERFORMANCE STATUS: 2  Physical Exam  Constitutional: Oriented to person, place, and time and well-developed, well-nourished, and in no distress. HENT:  Head: Normocephalic and atraumatic.  Mouth/Throat: Oropharynx is clear and moist. No oropharyngeal exudate.  Eyes: Conjunctivae are normal. Right eye exhibits no discharge. Left eye exhibits no discharge. No scleral icterus.  ENT: Abnormal tympanic membranes bilaterally. Neck: Normal range of motion. Neck supple.  Cardiovascular: Normal rate, regular rhythm, normal heart sounds and intact distal pulses.    Pulmonary/Chest: Effort normal and breath sounds normal. No respiratory distress. No wheezes. No rales.  Abdominal: Soft. Bowel sounds are normal. Exhibits no distension and no mass. There is no tenderness.  Musculoskeletal: Normal range of motion. Exhibits no edema.  Lymphadenopathy:    No cervical adenopathy.  Neurological: Alert and oriented to person, place, and time. Exhibits normal muscle tone. Gait normal. Coordination normal.  Skin: Skin is warm and dry. No rash noted. Not diaphoretic. No erythema. No pallor.  Psychiatric: Mood, memory and judgment normal.  Vitals reviewed.  LABORATORY DATA: Lab Results  Component Value Date   WBC 1.4 (L) 03/01/2021   HGB 11.5 (L) 03/01/2021   HCT 35.6 (L) 03/01/2021   MCV  93.9 03/01/2021   PLT 121 (L) 03/01/2021      Chemistry      Component Value Date/Time   NA 139 03/01/2021 0910   K 4.0 03/01/2021 0910   CL 105 03/01/2021 0910   CO2 30 03/01/2021 0910   BUN 11 03/01/2021 0910   CREATININE 0.80 03/01/2021 0910      Component Value Date/Time   CALCIUM 9.4 03/01/2021 0910   ALKPHOS 96 03/01/2021 0910   AST 22 03/01/2021 0910   ALT 16 03/01/2021 0910   BILITOT 0.8 03/01/2021 0910       RADIOGRAPHIC STUDIES:  No results found.   ASSESSMENT/PLAN:  This is a very pleasant 61 year old African-American male with stage IV (T2b, N2, M1 C) non-small cell lung cancer, adenocarcinoma.  He presented with a left upper lobe lung mass in addition to left hilar and mediastinal lymphadenopathy.  He had also had a small left iliac osseous lesion and metastatic disease to the brain.  He was diagnosed in September 2022.  PD-L1 expression 10%.  Guardant 360 does not have enough circulating tumor and his tissue was negative for any actionable mutations.   He completed SRS to the metastatic brain lesion under the care of Dr. Lisbeth Renshaw in October 2022.    The patient had disease progression in the brain in January 2023 and underwent whole brain  radiation under the care of Dr. Lisbeth Renshaw which was completed on 02/10/2021.    The patient underwent palliative systemic chemotherapy with carboplatin for an AUC of 5, Alimta 500 mg per metered squared, Keytruda 200 mg IV every 3 weeks.  The patient status post 5 cycles tolerated it fairly well.  Starting from cycle #5, the patient began maintenance Alimta and Keytruda.This was discontinued in February 2023 due to disease progression.   Due to the disease progression, the patient is currently on docetaxel 75 mg per metered squared and Cyramza 10 mg/kg IV every 3 weeks with Neulasta support.  The patient status post 1 cycle.  He had a challenging time with the Neulasta injection.  I reviewed the patient's symptoms and labs with Dr. Julien Nordmann today.  His total white blood cell count is low at 1.4 and his ANC 0.2.  The patient did receive the Neulasta injection and we expect this to improve his white blood cell count within the next few days.  In the meantime, the patient understands that he is at risk for infection.  I discussed at length that the patient needs to call us if he develops any signs and symptoms of infection including fevers, chills, sore throat, productive cough, shortness of breath, skin infections, abdominal pain, dysuria, or diarrhea.   We will continue to monitor his labs weekly.  We will see him back for follow-up visit in 2 weeks for evaluation before undergoing cycle #2.  He will continue to follow with palliative care, psychiatry, social work, and radiation oncology.  I have placed a referral to a primary care provider last week  The patient is expected to follow up with ENT.  We will reach out to ENT today to see if we can help facilitate making an appointment.  I advised the patient to pick up Remeron which is at the [facility].  Discussed that this can be used to help his appetite and to help his insomnia.  We arrange for an EKG today due to the patient's chest tightness.   His EKG showed normal sinus rhythm.  He has been having some dizziness,  headaches, and bilateral hearing loss since this time.  I have reached out to radiation oncology to see if they would consider repeat neuroimaging sooner for his headaches or any other medication recommendations (steriods, etc).  They recommended referring the patient to Dr. Mickeal Skinner for further recommendations/management.  I have placed a referral to Dr. Mickeal Skinner.  I also refer the patient to ENT due to his abnormal tympanic membrane's.  I advised the patient to take Tylenol if needed, he has good kidney and he  can take ibuprofen in between Tylenol if needed; however, we would recommend using this in moderation to try to preserve his kidney function.  The patient was advised to call immediately if she has any concerning symptoms in the interval. The patient voices understanding of current disease status and treatment options and is in agreement with the current care plan. All questions were answered. The patient knows to call the clinic with any problems, questions or concerns. We can certainly see the patient much sooner if necessary        Orders Placed This Encounter  Procedures   Amb Referral to Neuro Oncology    Referral Priority:   Routine    Referral Type:   Consultation    Referral Reason:   Specialty Services Required    Number of Visits Requested:   1   EKG 12-Lead    Ordered by Suhailah Kwan    EKG 12-Lead    Ordered by Charisa Twitty      The total time spent in the appointment was 30-39 minutes.   Samone Guhl L Garlene Apperson, PA-C 03/01/21

## 2021-03-01 ENCOUNTER — Encounter: Payer: Self-pay | Admitting: Physician Assistant

## 2021-03-01 ENCOUNTER — Other Ambulatory Visit: Payer: Self-pay

## 2021-03-01 ENCOUNTER — Ambulatory Visit: Payer: 59

## 2021-03-01 ENCOUNTER — Other Ambulatory Visit: Payer: 59

## 2021-03-01 ENCOUNTER — Inpatient Hospital Stay: Payer: Medicaid Other

## 2021-03-01 ENCOUNTER — Inpatient Hospital Stay (HOSPITAL_BASED_OUTPATIENT_CLINIC_OR_DEPARTMENT_OTHER): Payer: Medicaid Other | Admitting: Physician Assistant

## 2021-03-01 ENCOUNTER — Encounter: Payer: Self-pay | Admitting: Internal Medicine

## 2021-03-01 ENCOUNTER — Ambulatory Visit: Payer: 59 | Admitting: Internal Medicine

## 2021-03-01 ENCOUNTER — Telehealth: Payer: Self-pay

## 2021-03-01 ENCOUNTER — Other Ambulatory Visit (HOSPITAL_COMMUNITY): Payer: Self-pay

## 2021-03-01 VITALS — BP 143/84 | HR 92 | Temp 98.1°F | Resp 18 | Wt 260.2 lb

## 2021-03-01 DIAGNOSIS — C3412 Malignant neoplasm of upper lobe, left bronchus or lung: Secondary | ICD-10-CM | POA: Diagnosis not present

## 2021-03-01 DIAGNOSIS — I10 Essential (primary) hypertension: Secondary | ICD-10-CM

## 2021-03-01 DIAGNOSIS — R0789 Other chest pain: Secondary | ICD-10-CM

## 2021-03-01 DIAGNOSIS — C7931 Secondary malignant neoplasm of brain: Secondary | ICD-10-CM

## 2021-03-01 DIAGNOSIS — R519 Headache, unspecified: Secondary | ICD-10-CM

## 2021-03-01 DIAGNOSIS — H9190 Unspecified hearing loss, unspecified ear: Secondary | ICD-10-CM

## 2021-03-01 DIAGNOSIS — R42 Dizziness and giddiness: Secondary | ICD-10-CM | POA: Diagnosis not present

## 2021-03-01 DIAGNOSIS — C3492 Malignant neoplasm of unspecified part of left bronchus or lung: Secondary | ICD-10-CM

## 2021-03-01 DIAGNOSIS — K5720 Diverticulitis of large intestine with perforation and abscess without bleeding: Secondary | ICD-10-CM | POA: Diagnosis not present

## 2021-03-01 DIAGNOSIS — G44309 Post-traumatic headache, unspecified, not intractable: Secondary | ICD-10-CM

## 2021-03-01 DIAGNOSIS — S0990XS Unspecified injury of head, sequela: Secondary | ICD-10-CM | POA: Insufficient documentation

## 2021-03-01 DIAGNOSIS — R188 Other ascites: Secondary | ICD-10-CM | POA: Diagnosis not present

## 2021-03-01 LAB — CBC WITH DIFFERENTIAL (CANCER CENTER ONLY)
Abs Immature Granulocytes: 0.03 10*3/uL (ref 0.00–0.07)
Basophils Absolute: 0 10*3/uL (ref 0.0–0.1)
Basophils Relative: 2 %
Eosinophils Absolute: 0.1 10*3/uL (ref 0.0–0.5)
Eosinophils Relative: 10 %
HCT: 35.6 % — ABNORMAL LOW (ref 39.0–52.0)
Hemoglobin: 11.5 g/dL — ABNORMAL LOW (ref 13.0–17.0)
Immature Granulocytes: 2 %
Lymphocytes Relative: 60 %
Lymphs Abs: 0.8 10*3/uL (ref 0.7–4.0)
MCH: 30.3 pg (ref 26.0–34.0)
MCHC: 32.3 g/dL (ref 30.0–36.0)
MCV: 93.9 fL (ref 80.0–100.0)
Monocytes Absolute: 0.2 10*3/uL (ref 0.1–1.0)
Monocytes Relative: 11 %
Neutro Abs: 0.2 10*3/uL — CL (ref 1.7–7.7)
Neutrophils Relative %: 15 %
Platelet Count: 121 10*3/uL — ABNORMAL LOW (ref 150–400)
RBC: 3.79 MIL/uL — ABNORMAL LOW (ref 4.22–5.81)
RDW: 12.3 % (ref 11.5–15.5)
Smear Review: NORMAL
WBC Count: 1.4 10*3/uL — ABNORMAL LOW (ref 4.0–10.5)
nRBC: 0 % (ref 0.0–0.2)

## 2021-03-01 LAB — CMP (CANCER CENTER ONLY)
ALT: 16 U/L (ref 0–44)
AST: 22 U/L (ref 15–41)
Albumin: 3.7 g/dL (ref 3.5–5.0)
Alkaline Phosphatase: 96 U/L (ref 38–126)
Anion gap: 4 — ABNORMAL LOW (ref 5–15)
BUN: 11 mg/dL (ref 6–20)
CO2: 30 mmol/L (ref 22–32)
Calcium: 9.4 mg/dL (ref 8.9–10.3)
Chloride: 105 mmol/L (ref 98–111)
Creatinine: 0.8 mg/dL (ref 0.61–1.24)
GFR, Estimated: >60 mL/min (ref >60)
Glucose, Bld: 99 mg/dL (ref 70–99)
Potassium: 4 mmol/L (ref 3.5–5.1)
Sodium: 139 mmol/L (ref 135–145)
Total Bilirubin: 0.8 mg/dL (ref 0.3–1.2)
Total Protein: 7.2 g/dL (ref 6.5–8.1)

## 2021-03-01 NOTE — Telephone Encounter (Signed)
CRITICAL VALUE STICKER  CRITICAL VALUE: [place] =0.2  RECEIVER (on-site recipient of call): Yetta Glassman, CMA  DATE & TIME NOTIFIED: 03/01/21 at 10:25am  MESSENGER (representative from lab): Heather  MD NOTIFIED: C. [name]  TIME OF NOTIFICATION: 03/01/21 at 10:27am  RESPONSE: Notification provided to Cassandra for follow-up with pt who is present for evaluation.

## 2021-03-01 NOTE — Telephone Encounter (Signed)
I have called [facility] ([place]) regarding referral sent on 02/23/21 and LM requesting a status update as pt indicated he has not heard anything regarding his referral. I have left my direct call back number.

## 2021-03-02 ENCOUNTER — Telehealth: Payer: Self-pay | Admitting: Internal Medicine

## 2021-03-02 ENCOUNTER — Encounter: Payer: Self-pay | Admitting: General Practice

## 2021-03-02 NOTE — Progress Notes (Signed)
[facility] Spiritual Care Note  Attempted follow-up pastoral phone call. Left voicemail encouraging return call.   [place], [place], [facility] Pager [phone] Voicemail [phone]

## 2021-03-02 NOTE — Telephone Encounter (Signed)
Scheduled appt per 2/27 referral. [name] aware of appt date and time. [name] aware to arrive 15 mins prior to appt time and to bring and updated insurance card. [name] aware of appt location.   °

## 2021-03-03 ENCOUNTER — Emergency Department (HOSPITAL_COMMUNITY): Payer: 59

## 2021-03-03 ENCOUNTER — Inpatient Hospital Stay (HOSPITAL_COMMUNITY)
Admission: EM | Admit: 2021-03-03 | Discharge: 2021-03-09 | DRG: 391 | Disposition: A | Discharge status: Home / Self Care | Payer: 59 | Attending: Student | Admitting: Student

## 2021-03-03 ENCOUNTER — Other Ambulatory Visit: Payer: Self-pay

## 2021-03-03 DIAGNOSIS — F17210 Nicotine dependence, cigarettes, uncomplicated: Secondary | ICD-10-CM | POA: Diagnosis present

## 2021-03-03 DIAGNOSIS — T148XXA Other injury of unspecified body region, initial encounter: Secondary | ICD-10-CM | POA: Diagnosis present

## 2021-03-03 DIAGNOSIS — K651 Peritoneal abscess: Secondary | ICD-10-CM | POA: Diagnosis present

## 2021-03-03 DIAGNOSIS — Z5111 Encounter for antineoplastic chemotherapy: Secondary | ICD-10-CM | POA: Diagnosis present

## 2021-03-03 DIAGNOSIS — Z79899 Other long term (current) drug therapy: Secondary | ICD-10-CM

## 2021-03-03 DIAGNOSIS — D649 Anemia, unspecified: Secondary | ICD-10-CM

## 2021-03-03 DIAGNOSIS — B9620 Unspecified Escherichia coli [E. coli] as the cause of diseases classified elsewhere: Secondary | ICD-10-CM | POA: Diagnosis present

## 2021-03-03 DIAGNOSIS — C7931 Secondary malignant neoplasm of brain: Secondary | ICD-10-CM | POA: Diagnosis present

## 2021-03-03 DIAGNOSIS — G2581 Restless legs syndrome: Secondary | ICD-10-CM | POA: Diagnosis present

## 2021-03-03 DIAGNOSIS — I252 Old myocardial infarction: Secondary | ICD-10-CM

## 2021-03-03 DIAGNOSIS — M1990 Unspecified osteoarthritis, unspecified site: Secondary | ICD-10-CM | POA: Diagnosis present

## 2021-03-03 DIAGNOSIS — Z5189 Encounter for other specified aftercare: Secondary | ICD-10-CM | POA: Diagnosis not present

## 2021-03-03 DIAGNOSIS — G3184 Mild cognitive impairment, so stated: Secondary | ICD-10-CM | POA: Diagnosis present

## 2021-03-03 DIAGNOSIS — E669 Obesity, unspecified: Secondary | ICD-10-CM | POA: Diagnosis present

## 2021-03-03 DIAGNOSIS — F32A Depression, unspecified: Secondary | ICD-10-CM | POA: Diagnosis present

## 2021-03-03 DIAGNOSIS — G4733 Obstructive sleep apnea (adult) (pediatric): Secondary | ICD-10-CM | POA: Diagnosis present

## 2021-03-03 DIAGNOSIS — K219 Gastro-esophageal reflux disease without esophagitis: Secondary | ICD-10-CM | POA: Diagnosis present

## 2021-03-03 DIAGNOSIS — K5780 Diverticulitis of intestine, part unspecified, with perforation and abscess without bleeding: Secondary | ICD-10-CM | POA: Insufficient documentation

## 2021-03-03 DIAGNOSIS — Z6835 Body mass index (BMI) 35.0-35.9, adult: Secondary | ICD-10-CM | POA: Diagnosis not present

## 2021-03-03 DIAGNOSIS — K5792 Diverticulitis of intestine, part unspecified, without perforation or abscess without bleeding: Secondary | ICD-10-CM | POA: Diagnosis present

## 2021-03-03 DIAGNOSIS — Z87442 Personal history of urinary calculi: Secondary | ICD-10-CM | POA: Diagnosis not present

## 2021-03-03 DIAGNOSIS — R040 Epistaxis: Secondary | ICD-10-CM | POA: Diagnosis not present

## 2021-03-03 DIAGNOSIS — R188 Other ascites: Secondary | ICD-10-CM

## 2021-03-03 DIAGNOSIS — I10 Essential (primary) hypertension: Secondary | ICD-10-CM | POA: Diagnosis present

## 2021-03-03 DIAGNOSIS — F419 Anxiety disorder, unspecified: Secondary | ICD-10-CM | POA: Diagnosis present

## 2021-03-03 DIAGNOSIS — Z888 Allergy status to other drugs, medicaments and biological substances status: Secondary | ICD-10-CM | POA: Diagnosis not present

## 2021-03-03 DIAGNOSIS — C3412 Malignant neoplasm of upper lobe, left bronchus or lung: Secondary | ICD-10-CM | POA: Diagnosis present

## 2021-03-03 DIAGNOSIS — Z8249 Family history of ischemic heart disease and other diseases of the circulatory system: Secondary | ICD-10-CM

## 2021-03-03 DIAGNOSIS — D72825 Bandemia: Secondary | ICD-10-CM

## 2021-03-03 DIAGNOSIS — R4189 Other symptoms and signs involving cognitive functions and awareness: Secondary | ICD-10-CM | POA: Diagnosis present

## 2021-03-03 DIAGNOSIS — R634 Abnormal weight loss: Secondary | ICD-10-CM | POA: Diagnosis not present

## 2021-03-03 DIAGNOSIS — E6609 Other obesity due to excess calories: Secondary | ICD-10-CM | POA: Diagnosis not present

## 2021-03-03 DIAGNOSIS — C3492 Malignant neoplasm of unspecified part of left bronchus or lung: Secondary | ICD-10-CM | POA: Diagnosis not present

## 2021-03-03 DIAGNOSIS — Z20822 Contact with and (suspected) exposure to covid-19: Secondary | ICD-10-CM | POA: Diagnosis present

## 2021-03-03 DIAGNOSIS — K5720 Diverticulitis of large intestine with perforation and abscess without bleeding: Principal | ICD-10-CM

## 2021-03-03 DIAGNOSIS — Z9189 Other specified personal risk factors, not elsewhere classified: Secondary | ICD-10-CM | POA: Diagnosis not present

## 2021-03-03 DIAGNOSIS — G893 Neoplasm related pain (acute) (chronic): Secondary | ICD-10-CM | POA: Diagnosis not present

## 2021-03-03 DIAGNOSIS — Z801 Family history of malignant neoplasm of trachea, bronchus and lung: Secondary | ICD-10-CM

## 2021-03-03 DIAGNOSIS — F17200 Nicotine dependence, unspecified, uncomplicated: Secondary | ICD-10-CM | POA: Diagnosis not present

## 2021-03-03 DIAGNOSIS — Z8701 Personal history of pneumonia (recurrent): Secondary | ICD-10-CM

## 2021-03-03 DIAGNOSIS — I159 Secondary hypertension, unspecified: Secondary | ICD-10-CM | POA: Diagnosis not present

## 2021-03-03 DIAGNOSIS — K5900 Constipation, unspecified: Secondary | ICD-10-CM | POA: Diagnosis not present

## 2021-03-03 DIAGNOSIS — R109 Unspecified abdominal pain: Secondary | ICD-10-CM

## 2021-03-03 LAB — CBC WITH DIFFERENTIAL/PLATELET
Abs Immature Granulocytes: 0.15 10*3/uL — ABNORMAL HIGH (ref 0.00–0.07)
Basophils Absolute: 0 10*3/uL (ref 0.0–0.1)
Basophils Relative: 0 %
Eosinophils Absolute: 0 10*3/uL (ref 0.0–0.5)
Eosinophils Relative: 1 %
HCT: 41.1 % (ref 39.0–52.0)
Hemoglobin: 13.5 g/dL (ref 13.0–17.0)
Immature Granulocytes: 2 %
Lymphocytes Relative: 17 %
Lymphs Abs: 1.2 10*3/uL (ref 0.7–4.0)
MCH: 31 pg (ref 26.0–34.0)
MCHC: 32.8 g/dL (ref 30.0–36.0)
MCV: 94.5 fL (ref 80.0–100.0)
Monocytes Absolute: 1.4 10*3/uL — ABNORMAL HIGH (ref 0.1–1.0)
Monocytes Relative: 19 %
Neutro Abs: 4.6 10*3/uL (ref 1.7–7.7)
Neutrophils Relative %: 61 %
Platelets: 160 10*3/uL (ref 150–400)
RBC: 4.35 MIL/uL (ref 4.22–5.81)
RDW: 12.3 % (ref 11.5–15.5)
WBC: 7.4 10*3/uL (ref 4.0–10.5)
nRBC: 1.1 % — ABNORMAL HIGH (ref 0.0–0.2)

## 2021-03-03 LAB — URINALYSIS, ROUTINE W REFLEX MICROSCOPIC
Bilirubin Urine: NEGATIVE
Glucose, UA: NEGATIVE mg/dL
Hgb urine dipstick: NEGATIVE
Ketones, ur: 5 mg/dL — AB
Leukocytes,Ua: NEGATIVE
Nitrite: NEGATIVE
Protein, ur: 100 mg/dL — AB
Specific Gravity, Urine: 1.024 (ref 1.005–1.030)
pH: 5 (ref 5.0–8.0)

## 2021-03-03 LAB — COMPREHENSIVE METABOLIC PANEL
ALT: 19 U/L (ref 0–44)
AST: 28 U/L (ref 15–41)
Albumin: 3.5 g/dL (ref 3.5–5.0)
Alkaline Phosphatase: 94 U/L (ref 38–126)
Anion gap: 8 (ref 5–15)
BUN: 17 mg/dL (ref 6–20)
CO2: 26 mmol/L (ref 22–32)
Calcium: 9.5 mg/dL (ref 8.9–10.3)
Chloride: 103 mmol/L (ref 98–111)
Creatinine, Ser: 1.33 mg/dL — ABNORMAL HIGH (ref 0.61–1.24)
GFR, Estimated: >60 mL/min (ref >60)
Glucose, Bld: 116 mg/dL — ABNORMAL HIGH (ref 70–99)
Potassium: 3.8 mmol/L (ref 3.5–5.1)
Sodium: 137 mmol/L (ref 135–145)
Total Bilirubin: 0.4 mg/dL (ref 0.3–1.2)
Total Protein: 7.5 g/dL (ref 6.5–8.1)

## 2021-03-03 LAB — RESP PANEL BY RT-PCR (FLU A&B, COVID) ARPGX2
Influenza A by PCR: NEGATIVE
Influenza B by PCR: NEGATIVE
SARS Coronavirus 2 by RT PCR: NEGATIVE

## 2021-03-03 LAB — LIPASE, BLOOD: Lipase: 21 U/L (ref 11–51)

## 2021-03-03 MED ORDER — ACETAMINOPHEN 650 MG RE SUPP: 650.0000 mg | Freq: Four times a day (QID) | RECTAL | Status: DC | PRN

## 2021-03-03 MED ORDER — ONDANSETRON HCL 4 MG/2ML IJ SOLN: 4.0000 mg | Freq: Four times a day (QID) | INTRAMUSCULAR | Status: DC | PRN

## 2021-03-03 MED ORDER — DOCUSATE SODIUM 100 MG PO CAPS
100.0000 mg | ORAL_CAPSULE | Freq: Two times a day (BID) | ORAL | Status: DC
  Administered 2021-03-03 – 2021-03-09 (×11): 100 mg via ORAL
  Filled 2021-03-03 (×11): qty 1

## 2021-03-03 MED ORDER — ONDANSETRON HCL 4 MG PO TABS
4.0000 mg | ORAL_TABLET | Freq: Four times a day (QID) | ORAL | Status: DC | PRN
Start: 2021-03-03 — End: 2021-03-09

## 2021-03-03 MED ORDER — HYDROMORPHONE HCL 1 MG/ML IJ SOLN
0.5000 mg | INTRAMUSCULAR | Status: DC | PRN
  Administered 2021-03-03 – 2021-03-04 (×5): 1 mg via INTRAVENOUS
  Filled 2021-03-03 (×5): qty 1

## 2021-03-03 MED ORDER — PIPERACILLIN-TAZOBACTAM 3.375 G IVPB
3.3750 g | Freq: Three times a day (TID) | INTRAVENOUS | Status: DC
  Administered 2021-03-03 – 2021-03-06 (×8): 3.375 g via INTRAVENOUS
  Filled 2021-03-03 (×9): qty 50

## 2021-03-03 MED ORDER — AMLODIPINE BESYLATE 5 MG PO TABS
5.0000 mg | ORAL_TABLET | Freq: Every day | ORAL | Status: DC
  Administered 2021-03-03 – 2021-03-05 (×2): 5 mg via ORAL
  Filled 2021-03-03 (×2): qty 1

## 2021-03-03 MED ORDER — HYDROMORPHONE HCL 1 MG/ML IJ SOLN
1.0000 mg | Freq: Once | INTRAMUSCULAR | Status: AC
  Administered 2021-03-03: 1 mg via INTRAVENOUS
  Filled 2021-03-03: qty 1

## 2021-03-03 MED ORDER — MEMANTINE HCL 10 MG PO TABS
10.0000 mg | ORAL_TABLET | Freq: Two times a day (BID) | ORAL | Status: DC
  Administered 2021-03-03 – 2021-03-09 (×11): 10 mg via ORAL
  Filled 2021-03-03 (×11): qty 1

## 2021-03-03 MED ORDER — MIRTAZAPINE 30 MG PO TABS
30.0000 mg | ORAL_TABLET | Freq: Every day | ORAL | Status: DC
  Administered 2021-03-03 – 2021-03-08 (×6): 30 mg via ORAL
  Filled 2021-03-03 (×6): qty 1

## 2021-03-03 MED ORDER — FOLIC ACID 1 MG PO TABS
1.0000 mg | ORAL_TABLET | Freq: Every day | ORAL | Status: DC
  Administered 2021-03-03 – 2021-03-09 (×6): 1 mg via ORAL
  Filled 2021-03-03 (×6): qty 1

## 2021-03-03 MED ORDER — IOHEXOL 300 MG/ML  SOLN
100.0000 mL | Freq: Once | INTRAMUSCULAR | Status: AC | PRN
  Administered 2021-03-03: 100 mL via INTRAVENOUS

## 2021-03-03 MED ORDER — PROCHLORPERAZINE MALEATE 10 MG PO TABS
10.0000 mg | ORAL_TABLET | Freq: Four times a day (QID) | ORAL | Status: DC | PRN
  Administered 2021-03-05: 10 mg via ORAL
  Filled 2021-03-03: qty 1

## 2021-03-03 MED ORDER — ONDANSETRON HCL 4 MG/2ML IJ SOLN
4.0000 mg | Freq: Once | INTRAMUSCULAR | Status: AC
  Administered 2021-03-03: 4 mg via INTRAVENOUS
  Filled 2021-03-03: qty 2

## 2021-03-03 MED ORDER — ENOXAPARIN SODIUM 60 MG/0.6ML IJ SOSY
60.0000 mg | PREFILLED_SYRINGE | INTRAMUSCULAR | Status: DC
  Administered 2021-03-03: 60 mg via SUBCUTANEOUS
  Filled 2021-03-03 (×2): qty 0.6

## 2021-03-03 MED ORDER — PIPERACILLIN-TAZOBACTAM 3.375 G IVPB 30 MIN
3.3750 g | Freq: Once | INTRAVENOUS | Status: AC
  Administered 2021-03-03: 3.375 g via INTRAVENOUS

## 2021-03-03 MED ORDER — ACETAMINOPHEN 325 MG PO TABS: 650.0000 mg | ORAL_TABLET | Freq: Four times a day (QID) | ORAL | Status: DC | PRN

## 2021-03-03 MED ORDER — TAMSULOSIN HCL 0.4 MG PO CAPS
0.4000 mg | ORAL_CAPSULE | Freq: Every day | ORAL | Status: DC
  Administered 2021-03-03 – 2021-03-08 (×6): 0.4 mg via ORAL
  Filled 2021-03-03 (×6): qty 1

## 2021-03-03 MED ORDER — LACTATED RINGERS IV SOLN
INTRAVENOUS | Status: DC
Start: 2021-03-03 — End: 2021-03-06

## 2021-03-03 MED ORDER — GABAPENTIN 300 MG PO CAPS
300.0000 mg | ORAL_CAPSULE | Freq: Every day | ORAL | Status: DC
  Administered 2021-03-03 – 2021-03-08 (×6): 300 mg via ORAL
  Filled 2021-03-03 (×6): qty 1

## 2021-03-03 NOTE — Assessment & Plan Note (Addendum)
S/p CT-guided drain placement on 3/2.  Abscess culture polymicrobial.  Abdominal pain and leukocytosis improved.  Tolerated soft diet.  Cleared for discharge by general surgery and IR. ?-IV Zosyn>> IV Invanz>> p.o. Augmentin for 2 more days ?-Continue flushing drain with 5 cc NS every 8 hours. ?-Outpatient follow-up with general surgery and IR ?

## 2021-03-03 NOTE — Assessment & Plan Note (Addendum)
-  Continue Zoloft and Remeron. ?

## 2021-03-03 NOTE — Assessment & Plan Note (Addendum)
Increase home amlodipine to 10 mg daily ?-Reassess and adjust antihypertensive meds as appropriate ?-Consider referral to sleep clinic to rule out sleep apnea ?

## 2021-03-03 NOTE — ED Provider Notes (Signed)
?[facility] DEPT ?Provider Note ? ? ?CSN: [id number] ?Arrival date & time: 03/03/21  0934 ? ?  ? ?History ? ?Chief Complaint  ?Patient presents with  ? Shortness of Breath  ? Abdominal Pain  ? ? ?Raymond [name] is a 61 y.o. male. ? 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  States that it felt like abdominal cramping with some associated nausea.  Made him feel short of breath due to the pain.  Notes no diarrhea or bloody stools.  Denies any urinary symptoms.  Had similar episode in the past without diagnosis.  Patient is getting chemotherapy at this time and last dose was 2 weeks ago. ? ? ?  ? ?Home Medications ?Prior to Admission medications   ?Medication Sig Start Date End Date Taking? Authorizing Provider  ?acetaminophen (TYLENOL) 650 MG CR tablet Take 650 mg by mouth every 8 (eight) hours as needed for pain.    [provider]  ?amLODipine (NORVASC) 5 MG tablet Take 1 tablet (5 mg total) by mouth daily. 01/27/21 01/27/22  [name], MD  ?dexamethasone (DECADRON) 4 MG tablet Please take 2 tablets 2 times a day the day before, the day of, and the day after treatment. 02/15/21   [name], [name], PA-C  ?folic acid (FOLVITE) 1 MG tablet Take 1 tablet (1 mg total) by mouth daily. 01/27/21 03/31/21  [name], MD  ?gabapentin (NEURONTIN) 300 MG capsule Take 1 capsule (300 mg total) by mouth at bedtime. 02/15/21 03/17/21  Pickenpack-Cousar, Carlena Sax, NP  ?memantine (NAMENDA) 10 MG tablet Take 1 tablet (10 mg total) by mouth 2 (two) times daily. 01/26/21   [name], PA-C  ?memantine [facility]) 5 MG tablet Begin this prescription the first day of brain radiation. Week 1: Take 1 tablet by mouth every morning. Week 2: Take 1 tablet every morning and evening. Week 3: Take 2 tablets every morning, and 1 tablet in the evening. Week 4: Take 2 tablets in the morning and evening. Fill  subsequent prescription each month. 01/26/21   [name], PA-C  ?mirtazapine (REMERON) 30 MG tablet Take 1 tablet (30 mg total) by mouth at bedtime. 02/15/21   Pickenpack-Cousar, Carlena Sax, NP  ?Misc Natural Products (OSTEO BI-FLEX ADV JOINT SHIELD) TABS Take 1 tablet by mouth daily.    [provider]  ?ondansetron (ZOFRAN) 8 MG tablet Take 1 tablet (8 mg total) by mouth every 8 (eight) hours as needed for nausea or vomiting. 02/25/21   Pickenpack-Cousar, Carlena Sax, NP  ?oxyCODONE (OXY IR/ROXICODONE) 5 MG immediate release tablet Take 1 tablet (5 mg total) by mouth every 6 (six) hours as needed for severe pain. 02/25/21   Pickenpack-Cousar, Carlena Sax, NP  ?prochlorperazine (COMPAZINE) 10 MG tablet Take 1 tablet (10 mg total) by mouth every 6 (six) hours as needed for nausea or vomiting. 02/25/21   Pickenpack-Cousar, Carlena Sax, NP  ?simethicone (MYLICON) 993 MG chewable tablet Chew 125 mg by mouth every 6 (six) hours as needed for flatulence.    [provider]  ?tamsulosin (FLOMAX) 0.4 MG CAPS capsule Take 1 capsule (0.4 mg total) by mouth daily after supper. 01/27/21   [name], MD  ?Wheat Dextrin (BENEFIBER DRINK MIX) PACK Take 1 Package by mouth daily at 6 (six) AM.    [provider]  ?   ? ?Allergies    ?Benadryl [diphenhydramine]   ? ?Review  of Systems   ?Review of Systems  ?All other systems reviewed and are negative. ? ?Physical Exam ?Updated Vital Signs ?BP 121/84   Pulse 87   Temp 97.6 ?F (36.4 ?C) (Oral)   Resp 20   Ht 1.803 m (5\' 11" )   Wt 115.2 kg   SpO2 99%   BMI 35.43 kg/m?  ?Physical Exam ?Vitals and nursing note reviewed.  ?Constitutional:   ?   General: He is not in acute distress. ?   Appearance: Normal appearance. He is well-developed. He is not toxic-appearing.  ?HENT:  ?   Head: Normocephalic and atraumatic.  ?Eyes:  ?   General: Lids are normal.  ?   Conjunctiva/sclera: Conjunctivae normal.  ?   Pupils: Pupils are equal, round, and reactive  to light.  ?Neck:  ?   Thyroid: No thyroid mass.  ?   Trachea: No tracheal deviation.  ?Cardiovascular:  ?   Rate and Rhythm: Normal rate and regular rhythm.  ?   Heart sounds: Normal heart sounds. No murmur heard. ?  No gallop.  ?Pulmonary:  ?   Effort: Pulmonary effort is normal. No respiratory distress.  ?   Breath sounds: Normal breath sounds. No stridor. No decreased breath sounds, wheezing, rhonchi or rales.  ?Abdominal:  ?   General: There is no distension.  ?   Palpations: Abdomen is soft.  ?   Tenderness: There is abdominal tenderness in the right lower quadrant and left lower quadrant. There is no rebound.  ? ? ?Musculoskeletal:     ?   General: No tenderness. Normal range of motion.  ?   Cervical back: Normal range of motion and neck supple.  ?Skin: ?   General: Skin is warm and dry.  ?   Findings: No abrasion or rash.  ?Neurological:  ?   Mental Status: He is alert and oriented to person, place, and time. Mental status is at baseline.  ?   GCS: GCS eye subscore is 4. GCS verbal subscore is 5. GCS motor subscore is 6.  ?   Cranial Nerves: No cranial nerve deficit.  ?   Sensory: No sensory deficit.  ?   Motor: Motor function is intact.  ?Psychiatric:     ?   Attention and Perception: Attention normal.     ?   Speech: Speech normal.     ?   Behavior: Behavior normal.  ? ? ?ED Results / Procedures / Treatments   ?Labs ?(all labs ordered are listed, but only abnormal results are displayed) ?Labs Reviewed - No data to display ? ?EKG ?None ? ?Radiology ?No results found. ? ?Procedures ?Procedures  ? ? ?Medications Ordered in ED ?Medications  ?lactated ringers infusion (has no administration in time range)  ?ondansetron (ZOFRAN) injection 4 mg (has no administration in time range)  ? ? ?ED Course/ Medical Decision Making/ A&P ?  ?                        ?Medical Decision Making ?Amount and/or Complexity of Data Reviewed ?Labs: ordered. ?Radiology: ordered. ? ?Risk ?Prescription drug management. ? ? ?Patient  presented with abdominal pain.  Considered serious intra-abdominal process such as bowel obstruction or abscess and abdominal CT was ordered.  Prior to this he was medicated with IV pain medication.  CBC was without leukocytosis.  Abdominal CT shows intra abdominal abscess.  Discussed with general surgery who will see patient in consultation and recommend medicine admission.  Will order IV Zosyn.  Will consult hospitalist service ? ? ? ? ? ? ? ?Final Clinical Impression(s) / ED Diagnoses ?Final diagnoses:  ?None  ? ? ?Rx / DC Orders ?ED Discharge Orders   ? ? None  ? ?  ? ? ?  ?Lacretia Leigh, MD ?03/03/21 1546 ? ?

## 2021-03-03 NOTE — Consult Note (Addendum)
[facility] Surgery Consult Note  Raymond [name] Nov 11, 1960  [id number].    Requesting MD: Zenia Resides, MD Chief Complaint/Reason for Consult: left lower quadrant abscess, diverticulitis  HPI:  Raymond [name]  is a 61 yo M with metastatic lung cancer, currently undergoing chemotherapy and whole brain radiation, who presented to the ED with a cc abdominal pain.  States that he has generally felt bad for the last 2 weeks after his last dose of chemotherapy. Today while he was at work he developed worsening LLQ abdominal pain, nausea, and fevers. He has not had much of an appetite and is not eating.  Passing flatus but no recent BM. Due to worsening pain he called EMS and was brought to the ED. In the ED he was found to be afebrile, VSS. WBC 7.4. CT abdomen/ pelvis shows a new fluid collection in the left lower quadrant adjacent to the wall the sigmoid colon which measures 4.8 x 3.1 cm, likely due to abscess; persistent wall thickening of the sigmoid colon with surrounding inflammatory changes, finding has been present on multiple prior exams, differential considerations include acute on chronic diverticulitis or malignancy, correlate with recent colonoscopy. Patient is being admitted to the medical service. General surgery asked to see.  PMH significant for GERD, HTN, OSA, MI in 2006 Abdominal surgical history: none Last colonoscopy 01/26/2021 Anticoagulants: none Employment: [employer]   ROS: Review of Systems  Constitutional:  Positive for fever.  Gastrointestinal:  Positive for abdominal pain and nausea.   All systems reviewed and otherwise negative except for as above  Family History  Problem Relation Age of Onset   Lung cancer Mother    Heart failure Sister     Past Medical History:  Diagnosis Date   Arthritis    Baker's cyst    right   Diverticulitis    GERD (gastroesophageal reflux disease)    Heart attack ([place]) 01/04/2004   History of hiatal hernia    Hypertension     Malignant neoplasm of upper lobe of left lung ([place]) 09/24/2020   Pneumonia    as a child   Sleep apnea    no longer on Cpap    Past Surgical History:  Procedure Laterality Date   BIOPSY  01/26/2021   Procedure: BIOPSY;  Surgeon: Ronnette Juniper, MD;  Location: [facility] ENDOSCOPY;  Service: Gastroenterology;;   BRONCHIAL BIOPSY  09/14/2020   Procedure: BRONCHIAL BIOPSIES;  Surgeon: Collene Gobble, MD;  [facility]COPY;  Service: Cardiopulmonary;;   BRONCHIAL BRUSHINGS  09/14/2020   Procedure: BRONCHIAL BRUSHINGS;  Surgeon: Collene Gobble, MD;  Location: [place];  Service: Cardiopulmonary;;   BRONCHIAL NEEDLE ASPIRATION BIOPSY  09/14/2020   Procedure: BRONCHIAL NEEDLE ASPIRATION BIOPSIES;  Surgeon: Collene Gobble, MD;  Location: [place];  Service: Cardiopulmonary;;   CARDIAC CATHETERIZATION     CARPAL TUNNEL RELEASE     right   COLONOSCOPY WITH PROPOFOL N/A 01/26/2021   Procedure: COLONOSCOPY WITH PROPOFOL;  Surgeon: Ronnette Juniper, MD;  Location: [facility] ENDOSCOPY;  Service: Gastroenterology;  Laterality: N/A;   FIDUCIAL MARKER PLACEMENT  09/14/2020   Procedure: FIDUCIAL MARKER PLACEMENT;  Surgeon: Collene Gobble, MD;  Location: [facility] ENDOSCOPY;  Service: Cardiopulmonary;;   HAND SURGERY     left   KNEE SURGERY     left   VIDEO BRONCHOSCOPY N/A 09/14/2020   Procedure: ROBOTIC VIDEO BRONCHOSCOPY WITH FLUORO;  Surgeon: Collene Gobble, MD;  Location: [place];  Service: Cardiopulmonary;  Laterality: N/A;   VIDEO  BRONCHOSCOPY WITH ENDOBRONCHIAL ULTRASOUND N/A 09/14/2020   Procedure: VIDEO BRONCHOSCOPY WITH ENDOBRONCHIAL ULTRASOUND;  Surgeon: Collene Gobble, MD;  Location: [place];  Service: Cardiopulmonary;  Laterality: N/A;   VIDEO BRONCHOSCOPY WITH RADIAL ENDOBRONCHIAL ULTRASOUND  09/14/2020   Procedure: VIDEO BRONCHOSCOPY WITH RADIAL ENDOBRONCHIAL ULTRASOUND;  Surgeon: Collene Gobble, MD;  Location: [facility] ENDOSCOPY;  Service: Cardiopulmonary;;    Social History:  reports that he  has been smoking cigarettes. He has never used smokeless tobacco. He reports that he does not drink alcohol and does not use drugs.  Allergies:  Allergies  Allergen Reactions   Benadryl [Diphenhydramine] Itching    01/24/21 pt states this was a one time reaction and that he takes now when needed    (Not in a hospital admission)   Prior to Admission medications   Medication Sig Start Date End Date Taking? Authorizing Provider  acetaminophen (TYLENOL) 650 MG CR tablet Take 650 mg by mouth every 8 (eight) hours as needed for pain.    [provider]  amLODipine (NORVASC) 5 MG tablet Take 1 tablet (5 mg total) by mouth daily. 01/27/21 01/27/22  [name], MD  dexamethasone (DECADRON) 4 MG tablet Please take 2 tablets 2 times a day the day before, the day of, and the day after treatment. 02/15/21   [name], [name], PA-C  folic acid (FOLVITE) 1 MG tablet Take 1 tablet (1 mg total) by mouth daily. 01/27/21 03/31/21  [name], MD  gabapentin (NEURONTIN) 300 MG capsule Take 1 capsule (300 mg total) by mouth at bedtime. 02/15/21 03/17/21  Pickenpack-Cousar, Carlena Sax, NP  memantine (NAMENDA) 10 MG tablet Take 1 tablet (10 mg total) by mouth 2 (two) times daily. 01/26/21   [name], PA-C  memantine [facility]) 5 MG tablet Begin this prescription the first day of brain radiation. Week 1: Take 1 tablet by mouth every morning. Week 2: Take 1 tablet every morning and evening. Week 3: Take 2 tablets every morning, and 1 tablet in the evening. Week 4: Take 2 tablets in the morning and evening. Fill subsequent prescription each month. 01/26/21   [name], PA-C  mirtazapine (REMERON) 30 MG tablet Take 1 tablet (30 mg total) by mouth at bedtime. 02/15/21   Pickenpack-Cousar, Carlena Sax, NP  Misc Natural Products (OSTEO BI-FLEX ADV JOINT SHIELD) TABS Take 1 tablet by mouth daily.    [provider]  ondansetron (ZOFRAN) 8 MG tablet Take 1 tablet  (8 mg total) by mouth every 8 (eight) hours as needed for nausea or vomiting. 02/25/21   Pickenpack-Cousar, Carlena Sax, NP  oxyCODONE (OXY IR/ROXICODONE) 5 MG immediate release tablet Take 1 tablet (5 mg total) by mouth every 6 (six) hours as needed for severe pain. 02/25/21   Pickenpack-Cousar, Carlena Sax, NP  prochlorperazine (COMPAZINE) 10 MG tablet Take 1 tablet (10 mg total) by mouth every 6 (six) hours as needed for nausea or vomiting. 02/25/21   Pickenpack-Cousar, Carlena Sax, NP  simethicone (MYLICON) 272 MG chewable tablet Chew 125 mg by mouth every 6 (six) hours as needed for flatulence.    [provider]  tamsulosin (FLOMAX) 0.4 MG CAPS capsule Take 1 capsule (0.4 mg total) by mouth daily after supper. 01/27/21   [name], MD  Wheat Dextrin (BENEFIBER DRINK MIX) PACK Take 1 Package by mouth daily at 6 (six) AM.    [provider]    Blood pressure 122/80, pulse 85, temperature 97.6 F (36.4  C), temperature source Oral, resp. rate 11, height 5\' 11"  (1.803 m), weight 115.2 kg, SpO2 93 %. Physical Exam: General: pleasant, WD/WN male who is laying in bed in NAD HEENT: head is normocephalic, atraumatic.  Sclera are noninjected.  Pupils equal and round.  Ears and nose without any masses or lesions.  Mouth is pink and moist. Dentition fair Heart: regular, rate, and rhythm.  Normal s1,s2. No obvious murmurs, gallops, or rubs noted.  Palpable pedal pulses bilaterally  Lungs: CTAB, no wheezes, rhonchi, or rales noted.  Respiratory effort nonlabored Abd: soft, ND, +BS, no masses, hernias, or organomegaly. TTP LLQ without peritonitis MS: no BUE/BLE edema, calves soft and nontender Skin: warm and dry with no masses, lesions, or rashes Psych: A&Ox4 with an appropriate affect Neuro: cranial nerves grossly intact, equal strength in BUE/BLE bilaterally, normal speech, thought process intact  Results for orders placed or performed during the hospital encounter of 03/03/21 (from  the past 48 hour(s))  CBC with Differential/Platelet     Status: Abnormal (Preliminary result)   Collection Time: 03/03/21  9:58 AM  Result Value Ref Range   WBC 7.4 4.0 - 10.5 K/uL   RBC 4.35 4.22 - 5.81 MIL/uL   Hemoglobin 13.5 13.0 - 17.0 g/dL   HCT 41.1 39.0 - 52.0 %   MCV 94.5 80.0 - 100.0 fL   MCH 31.0 26.0 - 34.0 pg   MCHC 32.8 30.0 - 36.0 g/dL   RDW 12.3 11.5 - 15.5 %   Platelets 160 150 - 400 K/uL   nRBC 1.1 (H) 0.0 - 0.2 %    Comment: Performed at [facility], [place] [address] [place], [place] [address]   Neutrophils Relative % PENDING %   Neutro Abs PENDING 1.7 - 7.7 K/uL   Band Neutrophils PENDING %   Lymphocytes Relative PENDING %   Lymphs Abs PENDING 0.7 - 4.0 K/uL   Monocytes Relative PENDING %   Monocytes Absolute PENDING 0.1 - 1.0 K/uL   Eosinophils Relative PENDING %   Eosinophils Absolute PENDING 0.0 - 0.5 K/uL   Basophils Relative PENDING %   Basophils Absolute PENDING 0.0 - 0.1 K/uL   WBC Morphology PENDING    RBC Morphology PENDING    Smear Review PENDING    Other PENDING %   nRBC PENDING 0 /100 WBC   Metamyelocytes Relative PENDING %   Myelocytes PENDING %   Promyelocytes Relative PENDING %   Blasts PENDING %   Immature Granulocytes PENDING %   Abs Immature Granulocytes PENDING 0.00 - 0.07 K/uL  Comprehensive metabolic panel     Status: Abnormal   Collection Time: 03/03/21  9:58 AM  Result Value Ref Range   Sodium 137 135 - 145 mmol/L   Potassium 3.8 3.5 - 5.1 mmol/L   Chloride 103 98 - 111 mmol/L   CO2 26 22 - 32 mmol/L   Glucose, Bld 116 (H) 70 - 99 mg/dL    Comment: Glucose reference range applies only to samples taken after fasting for at least 8 hours.   BUN 17 6 - 20 mg/dL   Creatinine, Ser 1.33 (H) 0.61 - 1.24 mg/dL   Calcium 9.5 8.9 - 10.3 mg/dL   Total Protein 7.5 6.5 - 8.1 g/dL   Albumin 3.5 3.5 - 5.0 g/dL   AST 28 15 - 41 U/L   ALT 19 0 - 44 U/L   Alkaline Phosphatase 94 38 - 126 U/L   Total Bilirubin 0.4 0.3 -  1.2  mg/dL   GFR, Estimated >60 >60 mL/min    Comment: (NOTE) Calculated using the CKD-EPI Creatinine Equation (2021)    Anion gap 8 5 - 15    Comment: Performed at [facility], [place] [address] [place], [place] [address]  Lipase, blood     Status: None   Collection Time: 03/03/21  9:58 AM  Result Value Ref Range   Lipase 21 11 - 51 U/L    Comment: Performed at [facility], [place] [address], [place], [place] [address]  Resp Panel by RT-PCR (Flu A&B, Covid) Nasopharyngeal Swab     Status: None   Collection Time: 03/03/21 10:00 AM   Specimen: Nasopharyngeal Swab; Nasopharyngeal(NP) swabs in vial transport medium  Result Value Ref Range   SARS Coronavirus 2 by RT PCR NEGATIVE NEGATIVE    Comment: (NOTE) SARS-CoV-2 target nucleic acids are NOT DETECTED.  The SARS-CoV-2 RNA is generally detectable in upper respiratory specimens during the acute phase of infection. The lowest concentration of SARS-CoV-2 viral copies this assay can detect is 138 copies/mL. A negative result does not preclude SARS-Cov-2 infection and should not be used as the sole basis for treatment or other patient management decisions. A negative result may occur with  improper specimen collection/handling, submission of specimen other than nasopharyngeal swab, presence of viral mutation(s) within the areas targeted by this assay, and inadequate number of viral copies(<138 copies/mL). A negative result must be combined with clinical observations, patient history, and epidemiological information. The expected result is Negative.  Fact Sheet for Patients:  EntrepreneurPulse.com.au  Fact Sheet for Healthcare Providers:  IncredibleEmployment.be  This test is no t yet approved or cleared by the [place] FDA and  has been authorized for detection and/or diagnosis of SARS-CoV-2 by FDA under an Emergency Use Authorization (EUA). This EUA will  remain  in effect (meaning this test can be used) for the duration of the COVID-19 declaration under Section 564(b)(1) of the Act, 21 U.S.C.section 360bbb-3(b)(1), unless the authorization is terminated  or revoked sooner.       Influenza A by PCR NEGATIVE NEGATIVE   Influenza B by PCR NEGATIVE NEGATIVE    Comment: (NOTE) The Xpert Xpress SARS-CoV-2/FLU/RSV plus assay is intended as an aid in the diagnosis of influenza from Nasopharyngeal swab specimens and should not be used as a sole basis for treatment. Nasal washings and aspirates are unacceptable for Xpert Xpress SARS-CoV-2/FLU/RSV testing.  Fact Sheet for Patients: EntrepreneurPulse.com.au  Fact Sheet for Healthcare Providers: IncredibleEmployment.be  This test is not yet approved or cleared by the [place] FDA and has been authorized for detection and/or diagnosis of SARS-CoV-2 by FDA under an Emergency Use Authorization (EUA). This EUA will remain in effect (meaning this test can be used) for the duration of the COVID-19 declaration under Section 564(b)(1) of the Act, 21 U.S.C. section 360bbb-3(b)(1), unless the authorization is terminated or revoked.  Performed at [facility], [place] [address] [place], [place] [address]   Urinalysis, Routine w reflex microscopic Urine, Clean Catch     Status: Abnormal   Collection Time: 03/03/21 12:49 PM  Result Value Ref Range   Color, Urine AMBER (A) YELLOW    Comment: BIOCHEMICALS MAY BE AFFECTED BY COLOR   APPearance HAZY (A) CLEAR   Specific Gravity, Urine 1.024 1.005 - 1.030   pH 5.0 5.0 - 8.0   Glucose, UA NEGATIVE NEGATIVE mg/dL   Hgb urine dipstick NEGATIVE NEGATIVE   Bilirubin Urine NEGATIVE NEGATIVE  Ketones, ur 5 (A) NEGATIVE mg/dL   Protein, ur 100 (A) NEGATIVE mg/dL   Nitrite NEGATIVE NEGATIVE   Leukocytes,Ua NEGATIVE NEGATIVE   RBC / HPF 0-5 0 - 5 RBC/hpf   WBC, UA 11-20 0 - 5 WBC/hpf   Bacteria, UA  RARE (A) NONE SEEN   Mucus PRESENT    Hyaline Casts, UA PRESENT     Comment: Performed at [facility], [place] [address], [place], [place] [address]   CT Abdomen Pelvis W Contrast  Result Date: 03/03/2021 CLINICAL DATA:  Nonlocalized abdominal pain, history of lung cancer EXAM: CT ABDOMEN AND PELVIS WITH CONTRAST TECHNIQUE: Multidetector CT imaging of the abdomen and pelvis was performed using the standard protocol following bolus administration of intravenous contrast. RADIATION DOSE REDUCTION: This exam was performed according to the departmental dose-optimization program which includes automated exposure control, adjustment of the mA and/or kV according to patient size and/or use of iterative reconstruction technique. CONTRAST:  171mL OMNIPAQUE IOHEXOL 300 MG/ML  SOLN COMPARISON:  CT abdomen and pelvis dated January 24, 2021; abdominal ultrasound dated January 24, 2021. FINDINGS: Lower chest: Numerous small solid pulmonary nodules, unchanged compared to prior exam. Hepatobiliary: New small low-attenuation liver lesions. Lesion of the right hepatic dome measuring 9 mm on series 2, image 13. Lesion of the left hepatic dome measuring 6 mm on image 13. Cholelithiasis. Unchanged mild gallbladder wall thickening, finding previously evaluated with ultrasound and likely due to adenomyomatosis. No biliary ductal dilation. Pancreas: Unremarkable. No pancreatic ductal dilatation or surrounding inflammatory changes. Spleen: Normal in size without focal abnormality. Adrenals/Urinary Tract: Unchanged thickening of the left adrenal gland. Right adrenal gland is unremarkable. No hydronephrosis. Punctate nonobstructing bilateral renal stones. Bladder is unremarkable. Stomach/Bowel: Wall thickening of the sigmoid colon with surrounding inflammatory change. Associated fluid collection with air-fluid level seen adjacent to the wall of the sigmoid colon measuring 4.8 x 3.1 cm. Vascular/Lymphatic: Aortic  atherosclerosis. No enlarged abdominal or pelvic lymph nodes. Reproductive: Mild prostatomegaly. Other: No abdominal wall hernia or abnormality. No abdominopelvic ascites. Musculoskeletal: Moderate degenerative changes of the lumbar spine. No aggressive osseous lesions. IMPRESSION: 1. New fluid collection is seen in the left lower quadrant adjacent to the wall the sigmoid colon which measures 4.8 x 3.1 cm, likely due to abscess. 2. Persistent wall thickening of the sigmoid colon with surrounding inflammatory change, finding has been present on multiple prior exams, differential considerations include acute on chronic diverticulitis or malignancy, correlate with recent colonoscopy. 3. Numerous small solid pulmonary nodules of the partially visualized lung bases, unchanged compared to prior exam. 4. New subcentimeter lesions of the liver dome, concerning for progressive metastatic disease. 5. Aortic Atherosclerosis (ICD10-I70.0). Electronically Signed   By: Yetta Glassman M.D.   On: 03/03/2021 13:58      Assessment/Plan Sigmoid diverticulitis with abscess Patient with sigmoid diverticulitis. He was admitted in 09/2020 for the same and managed conservatively. His last colonoscopy was on 01/26/2021.  Today his CT scan shows a new 4.8x3.1cm fluid collection in the left lower quadrant adjacent to the wall of the sigmoid colon, likely due to abscess; persistent wall thickening of the sigmoid colon with surrounding inflammatory changes.  Patient is a poor surgical candidate given his metastatic lung cancer, and he is currently receiving chemotherapy. Hopefully this will resolve without surgery. Recommend medical admission, IV antibiotics. IR consult for possible drain placement. We will follow.   ID - zosyn VTE - ok for chemical dvt ppx from surgical standpoint FEN - IVF, NPO Foley -  none  Metastatic lung cancer on chemo, whole brain radiation Obesity BMI 35.43 HTN GERD OSA MI in 2006  Moderate  Medical Decision Making  [place], [facility] Surgery 03/03/2021, 3:40 PM Please see Amion for pager number during day hours 7:00am-4:30pm

## 2021-03-03 NOTE — Assessment & Plan Note (Addendum)
Status postchemotherapy and whole brain radiation. ?-Outpatient follow-up with Dr. Julien Nordmann. ?

## 2021-03-03 NOTE — ED Triage Notes (Signed)
Pt arrived by EMS from work complaining of shortness of breath and abdominal pain  ?Hx of lung and brain cancer that he has been treated for and diverticulitis  ? ? ?

## 2021-03-03 NOTE — Assessment & Plan Note (Deleted)
Patient has hx. of sigmoid diverticulitis which was managed conservatively in the past. ?Now he presented with worsening abdominal pain.  CT shows intra-abdominal abscess. ?Continue IV antibiotics, IR consulted for drainage. ?

## 2021-03-03 NOTE — Hospital Course (Addendum)
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  Started on IV Zosyn.  General surgery and IR consulted. ? ?Patient had CT-guided drain placement for diverticular abscess on 3/2.  Antibiotics changed to IV Invanz by general surgery.  Abscess culture with E. coli, Streptococcus agalactiae, few Clostridium innocuum and few Bactroides Distasonis.  Abdominal pain improved.  He tolerated soft diet.  Cleared for discharge by IR and general surgery for outpatient follow-up on p.o. Augmentin for 2 more weeks. ? ?Home health and DME ordered as recommended ?

## 2021-03-03 NOTE — H&P (Addendum)
History and Physical    Patient: Raymond [name] BOF:[id number] DOB: 10-27-1960  DOA: 03/03/2021  DOS: the patient was seen and examined on 03/03/2021  PCP: Pcp, No   Patient coming from: Home  Chief Complaint:  Chief Complaint  Patient presents with   Shortness of Breath   Abdominal Pain    HPI: Raymond [name] is a 61 y.o. male with PMH significant for metastatic lung cancer currently getting chemotherapy and whole brain radiation,  he presented in the ED with c/o: Left lower quadrant abdominal pain for 2 weeks.  Patient reports he developed pain in LLQ 2 weeks ago after he completed his last dose of his chemotherapy, he was able to manage pain with over-the-counter pain medications but it has gotten worse and now he has developed associated nausea and vomiting.  He described pain as severe in the left lower quadrant which is radiating towards the suprapubic area.  He reports subjective fever.  He reports similar pain in the past and found to have diverticulitis in which was managed without surgery.   ED course: He was hemodynamically stable. HR 87, RR 20, temp 97.6, BP 121/84, SPO2 99% on room air Labs include sodium 137, potassium 3.8, chloride 103, bicarb 26, glucose 116, BUN 17, creatinine 1.33, calcium 9.5, anion gap 8, lipase 21, albumin 3.5, AST 28, ALT 19, total bilirubin 0.4, WBC 7.4, hemoglobin 13.5, hematocrit 41.1, platelet 160, COVID-negative, influenza negative, UA unremarkable. CT A/P: New fluid collection is seen in the left lower quadrant adjacent to the wall the sigmoid colon which measures 4.8 x 3.1 cm, likely due to abscess. General surgery consulted, Patient is  poor surgical candidate given his metastatic lung cancer.  Plan: conservative management with bowel rest, IV antibiotics, IR consulted for possible drain placement.   Review of Systems: Review of Systems  Constitutional:  Positive for chills, fever and malaise/fatigue.  HENT: Negative.    Respiratory:  Negative.    Cardiovascular: Negative.   Gastrointestinal:  Positive for abdominal pain, nausea and vomiting.  Genitourinary: Negative.   Musculoskeletal: Negative.   Skin: Negative.   Neurological: Negative.   Psychiatric/Behavioral:  Positive for depression.     Past Medical History:  Diagnosis Date   Arthritis    Baker's cyst    right   Diverticulitis    GERD (gastroesophageal reflux disease)    Heart attack ([place]) 01/04/2004   History of hiatal hernia    Hypertension    Malignant neoplasm of upper lobe of left lung ([place]) 09/24/2020   Pneumonia    as a child   Sleep apnea    no longer on Cpap   Past Surgical History:  Procedure Laterality Date   BIOPSY  01/26/2021   Procedure: BIOPSY;  Surgeon: Ronnette Juniper, MD;  Location: [facility];  Service: Gastroenterology;;   BRONCHIAL BIOPSY  09/14/2020   Procedure: BRONCHIAL BIOPSIES;  Surgeon: Collene Gobble, MD;  Location: [facility] ENDOSCOPY;  Service: Cardiopulmonary;;   BRONCHIAL BRUSHINGS  09/14/2020   Procedure: BRONCHIAL BRUSHINGS;  Surgeon: Collene Gobble, MD;  Location: [place];  Service: Cardiopulmonary;;   BRONCHIAL NEEDLE ASPIRATION BIOPSY  09/14/2020   Procedure: BRONCHIAL NEEDLE ASPIRATION BIOPSIES;  Surgeon: Collene Gobble, MD;  Location: [place];  Service: Cardiopulmonary;;   CARDIAC CATHETERIZATION     CARPAL TUNNEL RELEASE     right   COLONOSCOPY WITH PROPOFOL N/A 01/26/2021   Procedure: COLONOSCOPY WITH PROPOFOL;  Surgeon: Ronnette Juniper, MD;  Location: [facility];  Service: Gastroenterology;  Laterality: N/A;   FIDUCIAL MARKER PLACEMENT  09/14/2020   Procedure: FIDUCIAL MARKER PLACEMENT;  Surgeon: Collene Gobble, MD;  Location: [facility] ENDOSCOPY;  Service: Cardiopulmonary;;   HAND SURGERY     left   KNEE SURGERY     left   VIDEO BRONCHOSCOPY N/A 09/14/2020   Procedure: ROBOTIC VIDEO BRONCHOSCOPY WITH FLUORO;  Surgeon: Collene Gobble, MD;  Location: [place];  Service: Cardiopulmonary;  Laterality: N/A;    VIDEO BRONCHOSCOPY WITH ENDOBRONCHIAL ULTRASOUND N/A 09/14/2020   Procedure: VIDEO BRONCHOSCOPY WITH ENDOBRONCHIAL ULTRASOUND;  Surgeon: Collene Gobble, MD;  Location: [place];  Service: Cardiopulmonary;  Laterality: N/A;   VIDEO BRONCHOSCOPY WITH RADIAL ENDOBRONCHIAL ULTRASOUND  09/14/2020   Procedure: VIDEO BRONCHOSCOPY WITH RADIAL ENDOBRONCHIAL ULTRASOUND;  Surgeon: Collene Gobble, MD;  Location: [facility];  Service: Cardiopulmonary;;   Social History:  reports that he has been smoking cigarettes. He has never used smokeless tobacco. He reports that he does not drink alcohol and does not use drugs.  Allergies  Allergen Reactions   Benadryl [Diphenhydramine] Itching    01/24/21 pt states this was a one time reaction and that he takes now when needed    Family History  Problem Relation Age of Onset   Lung cancer Mother    Heart failure Sister     Prior to Admission medications   Medication Sig Start Date End Date Taking? Authorizing Provider  acetaminophen (TYLENOL) 650 MG CR tablet Take 650 mg by mouth every 8 (eight) hours as needed for pain.   Yes [provider]  amLODipine (NORVASC) 5 MG tablet Take 1 tablet (5 mg total) by mouth daily. 01/27/21 01/27/22 Yes [name], MD  dexamethasone (DECADRON) 4 MG tablet Please take 2 tablets 2 times a day the day before, the day of, and the day after treatment. 02/15/21  Yes [name], [name], PA-C  Ensure Plus (ENSURE PLUS) LIQD Take 237 mLs by mouth 3 (three) times daily between meals.   Yes [provider]  folic acid (FOLVITE) 1 MG tablet Take 1 tablet (1 mg total) by mouth daily. 01/27/21 03/31/21 Yes [name], MD  gabapentin (NEURONTIN) 300 MG capsule Take 1 capsule (300 mg total) by mouth at bedtime. 02/15/21 03/17/21 Yes Pickenpack-Cousar, Carlena Sax, NP  guaiFENesin (MUCINEX) 600 MG 12 hr tablet Take 600 mg by mouth 2 (two) times daily.   Yes [provider]  Homeopathic  Products (ZICAM ALLERGY RELIEF NA) Place 2 sprays into the nose daily.   Yes [provider]  memantine (NAMENDA) 10 MG tablet Take 1 tablet (10 mg total) by mouth 2 (two) times daily. 01/26/21  Yes [name], PA-C  mirtazapine (REMERON) 30 MG tablet Take 1 tablet (30 mg total) by mouth at bedtime. 02/15/21  Yes Pickenpack-Cousar, Carlena Sax, NP  Misc Natural Products (OSTEO BI-FLEX ADV JOINT SHIELD) TABS Take 1 tablet by mouth daily.   Yes [provider]  ondansetron (ZOFRAN) 8 MG tablet Take 1 tablet (8 mg total) by mouth every 8 (eight) hours as needed for nausea or vomiting. 02/25/21  Yes Pickenpack-Cousar, Carlena Sax, NP  oxyCODONE (OXY IR/ROXICODONE) 5 MG immediate release tablet Take 1 tablet (5 mg total) by mouth every 6 (six) hours as needed for severe pain. 02/25/21  Yes Pickenpack-Cousar, Carlena Sax, NP  prochlorperazine (COMPAZINE) 10 MG tablet Take 1 tablet (10 mg total) by mouth every 6 (six) hours as needed for nausea or vomiting. 02/25/21  Yes Pickenpack-Cousar,  Carlena Sax, NP  simethicone (MYLICON) 782 MG chewable tablet Chew 125 mg by mouth every 6 (six) hours as needed for flatulence.   Yes [provider]  tamsulosin (FLOMAX) 0.4 MG CAPS capsule Take 1 capsule (0.4 mg total) by mouth daily after supper. 01/27/21  Yes [name], MD  Wheat Dextrin (BENEFIBER DRINK MIX) PACK Take 1 Package by mouth daily at 6 (six) AM.   Yes [provider]  memantine (NAMENDA) 5 MG tablet Begin this prescription the first day of brain radiation. Week 1: Take 1 tablet by mouth every morning. Week 2: Take 1 tablet every morning and evening. Week 3: Take 2 tablets every morning, and 1 tablet in the evening. Week 4: Take 2 tablets in the morning and evening. Fill subsequent prescription each month. Patient not taking: Reported on 03/03/2021 01/26/21   [name], PA-C    Physical Exam: Vitals:   03/03/21 1405 03/03/21 1530 03/03/21 1600 03/03/21  1709  BP: 135/71 122/80 120/76 138/75  Pulse: 84 85 82 86  Resp: 18 11 13 18   Temp:    98.2 F (36.8 C)  TempSrc:    Oral  SpO2: 92% 93% 91%   Weight:      Height:       General exam: Appears comfortable, not in any acute distress, morbidly obese. Respiratory : CTA bilaterally, no wheezing, no crackles, respiratory effort normal. Cardiovascular : S1-S2 heard, regular rate and rhythm, no murmur. Gastrointestinal : Abdomen is soft, nondistended, LLQ tenderness++, BS+ Central nervous system: Alert and oriented x3, no focal neurological deficits. Extremities: No edema, no cyanosis, no clubbing. Psychiatry: Mood, insight, judgment normal.   Data Reviewed: I have Reviewed nursing notes, Vitals, and Lab results since pt's last encounter. Pertinent lab results CBC, CMP, UA I have ordered test including CBC, CMP, mag, Phos I have independently visualized and interpreted imaging CT abdomen which showed sigmoid diverticulitis with abscess. I have reviewed the last note from general surgery,  I have discussed pt's care plan and test results with patient.   Assessment and Plan: * Abscess of sigmoid colon due to diverticulitis Patient presented with LLQ pain associated with nausea. CT abdomen showed intra-abdominal abscess. General surgery consulted, recommended IV antibiotics, bowel rest,  conservative management. Patient is not a surgical candidate given metastatic lung cancer. IR consulted for possible drainage. Continue IV antibiotics (Zosyn) Continue IV fluids, adequate pain control.  Depression Continue Zoloft and Remeron.  Hypertension- (present on admission) Continue amlodipine.  Adenocarcinoma of left lung, stage 4 ([place])- (present on admission) Patient has metastatic lung cancer, getting chemotherapy. Outpatient follow-up with Dr. Julien Nordmann.  Acute diverticulitis- (present on admission) Patient has hx. of sigmoid diverticulitis which was managed conservatively in the  past. Now he presented with worsening abdominal pain.  CT shows intra-abdominal abscess. Continue IV antibiotics, IR consulted for drainage.  Obesity, unspecified- (present on admission) Diet and exercise discussed in detail.  Needs outpatient follow-up.   Advance Care Planning:   Code Status: Full Code   Consults: General surgery, IR  Family Communication: Wife at bedside  Severity of Illness: The appropriate patient status for this patient is OBSERVATION. Observation status is judged to be reasonable and necessary in order to provide the required intensity of service to ensure the patient's safety. The patient's presenting symptoms, physical exam findings, and initial radiographic and laboratory data in the context of their medical condition is felt to place them at decreased risk for further clinical deterioration. Furthermore, it  is anticipated that the patient will be medically stable for discharge from the hospital within 2 midnights of admission.   Author: Shawna Clamp, MD 03/03/2021 5:27 PM  For on call review [email or web address].

## 2021-03-03 NOTE — Progress Notes (Signed)
Request to IR for LLQ fluid collection aspiration/drain placement. ? ?Patient history and imaging reviewed by Dr. Denna Haggard today who approves procedure for 3/2. ? ?Patient to be NPO at midnight (sips with meds), INR with AM labs, IR APP to see for consult/consent on 3/2. ? ?Candiss Norse, PA-C ?

## 2021-03-03 NOTE — Assessment & Plan Note (Addendum)
Body mass index is 35.43 kg/m?. ?-Encourage lifestyle change to lose weight ?

## 2021-03-03 NOTE — Progress Notes (Signed)
PHARMACY NOTE -  Zosyn ? ?Pharmacy has been assisting with dosing of Zosyn for intra-abdominal infection. ?Dosage remains stable at 3.375 g IV q8 hr and further renal adjustments per institutional Pharmacy antibiotic protocol ? ?Pharmacy will sign off, following peripherally for culture results or dose adjustments. Please reconsult if a change in clinical status warrants re-evaluation of dosage. ? ?Reuel Boom, PharmD, BCPS ?[phone] ?03/03/2021, 4:01 PM ? ? ? ? ?

## 2021-03-04 ENCOUNTER — Encounter (HOSPITAL_COMMUNITY): Payer: Self-pay | Admitting: Family Medicine

## 2021-03-04 ENCOUNTER — Inpatient Hospital Stay (HOSPITAL_COMMUNITY): Payer: 59

## 2021-03-04 DIAGNOSIS — F17200 Nicotine dependence, unspecified, uncomplicated: Secondary | ICD-10-CM

## 2021-03-04 DIAGNOSIS — C3492 Malignant neoplasm of unspecified part of left bronchus or lung: Secondary | ICD-10-CM

## 2021-03-04 DIAGNOSIS — G3184 Mild cognitive impairment, so stated: Secondary | ICD-10-CM | POA: Diagnosis present

## 2021-03-04 DIAGNOSIS — K5792 Diverticulitis of intestine, part unspecified, without perforation or abscess without bleeding: Secondary | ICD-10-CM

## 2021-03-04 DIAGNOSIS — E6609 Other obesity due to excess calories: Secondary | ICD-10-CM

## 2021-03-04 DIAGNOSIS — F419 Anxiety disorder, unspecified: Secondary | ICD-10-CM

## 2021-03-04 DIAGNOSIS — G2581 Restless legs syndrome: Secondary | ICD-10-CM

## 2021-03-04 DIAGNOSIS — R4189 Other symptoms and signs involving cognitive functions and awareness: Secondary | ICD-10-CM

## 2021-03-04 DIAGNOSIS — I159 Secondary hypertension, unspecified: Secondary | ICD-10-CM

## 2021-03-04 DIAGNOSIS — F32A Depression, unspecified: Secondary | ICD-10-CM

## 2021-03-04 LAB — PHOSPHORUS: Phosphorus: 3.7 mg/dL (ref 2.5–4.6)

## 2021-03-04 LAB — MAGNESIUM: Magnesium: 2.1 mg/dL (ref 1.7–2.4)

## 2021-03-04 LAB — COMPREHENSIVE METABOLIC PANEL
ALT: 16 U/L (ref 0–44)
AST: 22 U/L (ref 15–41)
Albumin: 3 g/dL — ABNORMAL LOW (ref 3.5–5.0)
Alkaline Phosphatase: 88 U/L (ref 38–126)
Anion gap: 6 (ref 5–15)
BUN: 16 mg/dL (ref 6–20)
CO2: 30 mmol/L (ref 22–32)
Calcium: 8.8 mg/dL — ABNORMAL LOW (ref 8.9–10.3)
Chloride: 102 mmol/L (ref 98–111)
Creatinine, Ser: 0.98 mg/dL (ref 0.61–1.24)
GFR, Estimated: >60 mL/min (ref >60)
Glucose, Bld: 99 mg/dL (ref 70–99)
Potassium: 4.5 mmol/L (ref 3.5–5.1)
Sodium: 138 mmol/L (ref 135–145)
Total Bilirubin: 0.2 mg/dL — ABNORMAL LOW (ref 0.3–1.2)
Total Protein: 6.6 g/dL (ref 6.5–8.1)

## 2021-03-04 LAB — CBC
HCT: 36.9 % — ABNORMAL LOW (ref 39.0–52.0)
Hemoglobin: 11.5 g/dL — ABNORMAL LOW (ref 13.0–17.0)
MCH: 30.3 pg (ref 26.0–34.0)
MCHC: 31.2 g/dL (ref 30.0–36.0)
MCV: 97.4 fL (ref 80.0–100.0)
Platelets: 152 10*3/uL (ref 150–400)
RBC: 3.79 MIL/uL — ABNORMAL LOW (ref 4.22–5.81)
RDW: 12.4 % (ref 11.5–15.5)
WBC: 12.7 10*3/uL — ABNORMAL HIGH (ref 4.0–10.5)
nRBC: 0.2 % (ref 0.0–0.2)

## 2021-03-04 LAB — PROTIME-INR
INR: 1.1 (ref 0.8–1.2)
Prothrombin Time: 14 seconds (ref 11.4–15.2)

## 2021-03-04 MED ORDER — HYDROMORPHONE HCL 1 MG/ML IJ SOLN
1.0000 mg | INTRAMUSCULAR | Status: DC | PRN
  Administered 2021-03-04 – 2021-03-09 (×24): 1 mg via INTRAVENOUS
  Filled 2021-03-04 (×24): qty 1

## 2021-03-04 MED ORDER — MIDAZOLAM HCL 2 MG/2ML IJ SOLN
INTRAMUSCULAR | Status: AC
  Filled 2021-03-04: qty 4

## 2021-03-04 MED ORDER — ENOXAPARIN SODIUM 60 MG/0.6ML IJ SOSY
60.0000 mg | PREFILLED_SYRINGE | INTRAMUSCULAR | Status: DC
  Administered 2021-03-05: 60 mg via SUBCUTANEOUS
  Filled 2021-03-04: qty 0.6

## 2021-03-04 MED ORDER — LIDOCAINE HCL (PF) 1 % IJ SOLN
INTRAMUSCULAR | Status: AC | PRN
  Administered 2021-03-04: 10 mL

## 2021-03-04 MED ORDER — MIDAZOLAM HCL 2 MG/2ML IJ SOLN
INTRAMUSCULAR | Status: AC | PRN
Start: 2021-03-04 — End: 2021-03-04
  Administered 2021-03-04 (×2): 1 mg via INTRAVENOUS

## 2021-03-04 MED ORDER — FENTANYL CITRATE (PF) 100 MCG/2ML IJ SOLN
INTRAMUSCULAR | Status: AC | PRN
  Administered 2021-03-04 (×2): 50 ug via INTRAVENOUS

## 2021-03-04 MED ORDER — FENTANYL CITRATE (PF) 100 MCG/2ML IJ SOLN
INTRAMUSCULAR | Status: AC
  Filled 2021-03-04: qty 2

## 2021-03-04 MED ORDER — ADULT MULTIVITAMIN W/MINERALS CH
1.0000 | ORAL_TABLET | Freq: Every day | ORAL | Status: DC
  Administered 2021-03-05 – 2021-03-09 (×5): 1 via ORAL
  Filled 2021-03-04 (×5): qty 1

## 2021-03-04 MED ORDER — SODIUM CHLORIDE 0.9% FLUSH
5.0000 mL | Freq: Three times a day (TID) | INTRAVENOUS | Status: DC
  Administered 2021-03-04 – 2021-03-09 (×13): 5 mL

## 2021-03-04 MED ORDER — ENSURE ENLIVE PO LIQD
237.0000 mL | Freq: Two times a day (BID) | ORAL | Status: DC
  Administered 2021-03-06 – 2021-03-09 (×7): 237 mL via ORAL

## 2021-03-04 NOTE — Consult Note (Signed)
Chief Complaint: Patient was seen in consultation today for CT-guided aspiration/drainage of left lower abdominal fluid collection Chief Complaint  Patient presents with   Shortness of Breath   Abdominal Pain    Referring Physician(s): Meuth,B,PA-C(CCS)  Supervising Physician: Sandi Mariscal  Patient Status: [facility] - In-pt  History of Present Illness: Raymond [name] is a 61 y.o. male with past medical history of arthritis, diverticular disease, GERD, prior MI, hiatal hernia, cholelithiasis, nephrolithiasis, hypertension, metastatic left lung cancer undergoing chemotherapy and whole brain radiation who presented to [facility] on 3/1 with persistent left lower abdominal pain with recent nausea and fevers, diminished appetite.  CT of the abdomen pelvis revealed:  1. New fluid collection is seen in the left lower quadrant adjacent to the wall the sigmoid colon which measures 4.8 x 3.1 cm, likely due to abscess. 2. Persistent wall thickening of the sigmoid colon with surrounding inflammatory change, finding has been present on multiple prior exams, differential considerations include acute on chronic diverticulitis or malignancy, correlate with recent colonoscopy. 3. Numerous small solid pulmonary nodules of the partially visualized lung bases, unchanged compared to prior exam. 4. New subcentimeter lesions of the liver dome, concerning for progressive metastatic disease. 5. Aortic Atherosclerosis   He is currently afebrile, on IV Zosyn, WBC 12.7, hemoglobin 11.5, platelets normal, creatinine normal, PT/INR normal.  Request now received from surgery for image guided drainage of left lower quadrant fluid collection.      Past Medical History:  Diagnosis Date   Arthritis    Baker's cyst    right   Diverticulitis    GERD (gastroesophageal reflux disease)    Heart attack ([place]) 01/04/2004   History of hiatal hernia    Hypertension    Malignant neoplasm of upper lobe of  left lung ([place]) 09/24/2020   Pneumonia    as a child   Sleep apnea    no longer on Cpap    Past Surgical History:  Procedure Laterality Date   BIOPSY  01/26/2021   Procedure: BIOPSY;  Surgeon: Ronnette Juniper, MD;  Location: [facility] ENDOSCOPY;  Service: Gastroenterology;;   BRONCHIAL BIOPSY  09/14/2020   Procedure: BRONCHIAL BIOPSIES;  Surgeon: Collene Gobble, MD;  Location: [facility] ENDOSCOPY;  Service: Cardiopulmonary;;   BRONCHIAL BRUSHINGS  09/14/2020   Procedure: BRONCHIAL BRUSHINGS;  Surgeon: Collene Gobble, MD;  Location: [facility];  Service: Cardiopulmonary;;   BRONCHIAL NEEDLE ASPIRATION BIOPSY  09/14/2020   Procedure: BRONCHIAL NEEDLE ASPIRATION BIOPSIES;  Surgeon: Collene Gobble, MD;  Location: [place];  Service: Cardiopulmonary;;   CARDIAC CATHETERIZATION     CARPAL TUNNEL RELEASE     right   COLONOSCOPY WITH PROPOFOL N/A 01/26/2021   Procedure: COLONOSCOPY WITH PROPOFOL;  Surgeon: Ronnette Juniper, MD;  Location: [facility] ENDOSCOPY;  Service: Gastroenterology;  Laterality: N/A;   FIDUCIAL MARKER PLACEMENT  09/14/2020   Procedure: FIDUCIAL MARKER PLACEMENT;  Surgeon: Collene Gobble, MD;  Location[facility] ENDOSCOPY;  Service: Cardiopulmonary;;   HAND SURGERY     left   KNEE SURGERY     left   VIDEO BRONCHOSCOPY N/A 09/14/2020   Procedure: ROBOTIC VIDEO BRONCHOSCOPY WITH FLUORO;  Surgeon: Collene Gobble, MD;  Location: [place];  Service: Cardiopulmonary;  Laterality: N/A;   VIDEO BRONCHOSCOPY WITH ENDOBRONCHIAL ULTRASOUND N/A 09/14/2020   Procedure: VIDEO BRONCHOSCOPY WITH ENDOBRONCHIAL ULTRASOUND;  Surgeon: Collene Gobble, MD;  Location: [place];  Service: Cardiopulmonary;  Laterality: N/A;   VIDEO BRONCHOSCOPY WITH RADIAL ENDOBRONCHIAL ULTRASOUND  09/14/2020  Procedure: VIDEO BRONCHOSCOPY WITH RADIAL ENDOBRONCHIAL ULTRASOUND;  Surgeon: Collene Gobble, MD;  Location: [facility] ENDOSCOPY;  Service: Cardiopulmonary;;    Allergies: Benadryl [diphenhydramine]  Medications: Prior to  Admission medications   Medication Sig Start Date End Date Taking? Authorizing Provider  acetaminophen (TYLENOL) 650 MG CR tablet Take 650 mg by mouth every 8 (eight) hours as needed for pain.   Yes [provider]  amLODipine (NORVASC) 5 MG tablet Take 1 tablet (5 mg total) by mouth daily. 01/27/21 01/27/22 Yes [name], MD  dexamethasone (DECADRON) 4 MG tablet Please take 2 tablets 2 times a day the day before, the day of, and the day after treatment. 02/15/21  Yes [name], [name], PA-C  Ensure Plus (ENSURE PLUS) LIQD Take 237 mLs by mouth 3 (three) times daily between meals.   Yes [provider]  folic acid (FOLVITE) 1 MG tablet Take 1 tablet (1 mg total) by mouth daily. 01/27/21 03/31/21 Yes [name], MD  gabapentin (NEURONTIN) 300 MG capsule Take 1 capsule (300 mg total) by mouth at bedtime. 02/15/21 03/17/21 Yes Pickenpack-Cousar, Carlena Sax, NP  guaiFENesin (MUCINEX) 600 MG 12 hr tablet Take 600 mg by mouth 2 (two) times daily.   Yes [provider]  Homeopathic Products (ZICAM ALLERGY RELIEF NA) Place 2 sprays into the nose daily.   Yes [provider]  memantine (NAMENDA) 10 MG tablet Take 1 tablet (10 mg total) by mouth 2 (two) times daily. 01/26/21  Yes [name], PA-C  mirtazapine (REMERON) 30 MG tablet Take 1 tablet (30 mg total) by mouth at bedtime. 02/15/21  Yes Pickenpack-Cousar, Carlena Sax, NP  Misc Natural Products (OSTEO BI-FLEX ADV JOINT SHIELD) TABS Take 1 tablet by mouth daily.   Yes [provider]  ondansetron (ZOFRAN) 8 MG tablet Take 1 tablet (8 mg total) by mouth every 8 (eight) hours as needed for nausea or vomiting. 02/25/21  Yes Pickenpack-Cousar, Carlena Sax, NP  oxyCODONE (OXY IR/ROXICODONE) 5 MG immediate release tablet Take 1 tablet (5 mg total) by mouth every 6 (six) hours as needed for severe pain. 02/25/21  Yes Pickenpack-Cousar, Carlena Sax, NP  prochlorperazine (COMPAZINE) 10 MG tablet  Take 1 tablet (10 mg total) by mouth every 6 (six) hours as needed for nausea or vomiting. 02/25/21  Yes Pickenpack-Cousar, Carlena Sax, NP  simethicone (MYLICON) 235 MG chewable tablet Chew 125 mg by mouth every 6 (six) hours as needed for flatulence.   Yes [provider]  tamsulosin (FLOMAX) 0.4 MG CAPS capsule Take 1 capsule (0.4 mg total) by mouth daily after supper. 01/27/21  Yes [name], MD  Wheat Dextrin (BENEFIBER DRINK MIX) PACK Take 1 Package by mouth daily at 6 (six) AM.   Yes [provider]  memantine (NAMENDA) 5 MG tablet Begin this prescription the first day of brain radiation. Week 1: Take 1 tablet by mouth every morning. Week 2: Take 1 tablet every morning and evening. Week 3: Take 2 tablets every morning, and 1 tablet in the evening. Week 4: Take 2 tablets in the morning and evening. Fill subsequent prescription each month. Patient not taking: Reported on 03/03/2021 01/26/21   [name], PA-C     Family History  Problem Relation Age of Onset   Lung cancer Mother    Heart failure Sister     Social History   Socioeconomic History   Marital status: Married    Spouse name: Not on file  Number of children: Not on file   Years of education: Not on file   Highest education level: Not on file  Occupational History   Not on file  Tobacco Use   Smoking status: Every Day    Types: Cigarettes   Smokeless tobacco: Never  Vaping Use   Vaping Use: Never used  Substance and Sexual Activity   Alcohol use: No   Drug use: No   Sexual activity: Not on file  Other Topics Concern   Not on file  Social History Narrative   Not on file   Social Determinants of Health   Financial Resource Strain: High Risk   Difficulty of Paying Living Expenses: Hard  Food Insecurity: No Food Insecurity   Worried About Running Out of Food in the Last Year: Never true   Ran Out of Food in the Last Year: Never true  Transportation Needs: No Transportation  Needs   Lack of Transportation (Medical): No   Lack of Transportation (Non-Medical): No  Physical Activity: Not on file  Stress: Stress Concern Present   Feeling of Stress : Very much  Social Connections: Socially Integrated   Frequency of Communication with Friends and Family: More than three times a week   Frequency of Social Gatherings with Friends and Family: More than three times a week   Attends Religious Services: More than 4 times per year   Active Member of [employer] or Organizations: Yes   Attends Music therapist: More than 4 times per year   Marital Status: Married      Review of Systems  see above; currently denies fever, chest pain, worsening dyspnea, cough, back pain, nausea, vomiting or bleeding.  He does have an occasional headache and continued left lower quadrant discomfort.  Also with dry mouth and chapped lips.  Vital Signs: BP (!) 157/84 (BP Location: Left Arm)    Pulse 95    Temp 98.1 F (36.7 C) (Oral)    Resp 19    Ht 5\' 11"  (1.803 m)    Wt 254 lb (115.2 kg)    SpO2 94%    BMI 35.43 kg/m   Physical Exam patient awake, answering questions appropriately.  Chest clear to auscultation bilaterally anteriorly.  Heart with regular rate and rhythm.  Abdomen soft, positive bowel sounds, tender left lower quadrant to palpation.  No lower extremity edema  Imaging: CT Abdomen Pelvis W Contrast  Result Date: 03/03/2021 CLINICAL DATA:  Nonlocalized abdominal pain, history of lung cancer EXAM: CT ABDOMEN AND PELVIS WITH CONTRAST TECHNIQUE: Multidetector CT imaging of the abdomen and pelvis was performed using the standard protocol following bolus administration of intravenous contrast. RADIATION DOSE REDUCTION: This exam was performed according to the departmental dose-optimization program which includes automated exposure control, adjustment of the mA and/or kV according to patient size and/or use of iterative reconstruction technique. CONTRAST:  172mL OMNIPAQUE  IOHEXOL 300 MG/ML  SOLN COMPARISON:  CT abdomen and pelvis dated January 24, 2021; abdominal ultrasound dated January 24, 2021. FINDINGS: Lower chest: Numerous small solid pulmonary nodules, unchanged compared to prior exam. Hepatobiliary: New small low-attenuation liver lesions. Lesion of the right hepatic dome measuring 9 mm on series 2, image 13. Lesion of the left hepatic dome measuring 6 mm on image 13. Cholelithiasis. Unchanged mild gallbladder wall thickening, finding previously evaluated with ultrasound and likely due to adenomyomatosis. No biliary ductal dilation. Pancreas: Unremarkable. No pancreatic ductal dilatation or surrounding inflammatory changes. Spleen: Normal in size without focal abnormality. Adrenals/Urinary Tract:  Unchanged thickening of the left adrenal gland. Right adrenal gland is unremarkable. No hydronephrosis. Punctate nonobstructing bilateral renal stones. Bladder is unremarkable. Stomach/Bowel: Wall thickening of the sigmoid colon with surrounding inflammatory change. Associated fluid collection with air-fluid level seen adjacent to the wall of the sigmoid colon measuring 4.8 x 3.1 cm. Vascular/Lymphatic: Aortic atherosclerosis. No enlarged abdominal or pelvic lymph nodes. Reproductive: Mild prostatomegaly. Other: No abdominal wall hernia or abnormality. No abdominopelvic ascites. Musculoskeletal: Moderate degenerative changes of the lumbar spine. No aggressive osseous lesions. IMPRESSION: 1. New fluid collection is seen in the left lower quadrant adjacent to the wall the sigmoid colon which measures 4.8 x 3.1 cm, likely due to abscess. 2. Persistent wall thickening of the sigmoid colon with surrounding inflammatory change, finding has been present on multiple prior exams, differential considerations include acute on chronic diverticulitis or malignancy, correlate with recent colonoscopy. 3. Numerous small solid pulmonary nodules of the partially visualized lung bases, unchanged  compared to prior exam. 4. New subcentimeter lesions of the liver dome, concerning for progressive metastatic disease. 5. Aortic Atherosclerosis (ICD10-I70.0). Electronically Signed   By: Yetta Glassman M.D.   On: 03/03/2021 13:58    Labs:  CBC: Recent Labs    02/23/21 1020 03/01/21 0910 03/03/21 0958 03/04/21 0523  WBC 7.2 1.4* 7.4 12.7*  HGB 12.0* 11.5* 13.5 11.5*  HCT 37.0* 35.6* 41.1 36.9*  PLT 194 121* 160 152    COAGS: Recent Labs    01/24/21 1200 01/25/21 0344 03/04/21 0523  INR 1.1 1.2 1.1  APTT 28  --   --     BMP: Recent Labs    02/23/21 1020 03/01/21 0910 03/03/21 0958 03/04/21 0523  NA 140 139 137 138  K 3.8 4.0 3.8 4.5  CL 107 105 103 102  CO2 27 30 26 30   GLUCOSE 140* 99 116* 99  BUN 17 11 17 16   CALCIUM 9.8 9.4 9.5 8.8*  CREATININE 0.85 0.80 1.33* 0.98  GFRNONAA >60 >60 >60 >60    LIVER FUNCTION TESTS: Recent Labs    02/23/21 1020 03/01/21 0910 03/03/21 0958 03/04/21 0523  BILITOT 0.4 0.8 0.4 0.2*  AST 18 22 28 22   ALT 12 16 19 16   ALKPHOS 103 96 94 88  PROT 8.0 7.2 7.5 6.6  ALBUMIN 4.0 3.7 3.5 3.0*    TUMOR MARKERS: No results for input(s): AFPTM, CEA, CA199, CHROMGRNA in the last 8760 hours.  Assessment and Plan: 61 y.o. male with past medical history of arthritis, diverticular disease, GERD, prior MI, hiatal hernia, cholelithiasis, nephrolithiasis, hypertension, metastatic left lung cancer undergoing chemotherapy and whole brain radiation who presented to [facility] on 3/1 with persistent left lower abdominal pain with recent nausea and fevers, diminished appetite.  CT of the abdomen pelvis revealed:  1. New fluid collection is seen in the left lower quadrant adjacent to the wall the sigmoid colon which measures 4.8 x 3.1 cm, likely due to abscess. 2. Persistent wall thickening of the sigmoid colon with surrounding inflammatory change, finding has been present on multiple prior exams, differential considerations  include acute on chronic diverticulitis or malignancy, correlate with recent colonoscopy. 3. Numerous small solid pulmonary nodules of the partially visualized lung bases, unchanged compared to prior exam. 4. New subcentimeter lesions of the liver dome, concerning for progressive metastatic disease. 5. Aortic Atherosclerosis   He is currently afebrile, on IV Zosyn, WBC 12.7, hemoglobin 11.5, platelets normal, creatinine normal, PT/INR normal.  Request now received from surgery for image guided  drainage of left lower quadrant fluid collection.  Imaging studies have been reviewed by Dr. Pascal Lux.Risks and benefits discussed with the patient /spouse including bleeding, infection, damage to adjacent structures, bowel perforation/fistula connection, and sepsis.  All of the patient's questions were answered, patient is agreeable to proceed. Consent signed and in chart.  Procedure scheduled for today  Thank you for this interesting consult.  I greatly enjoyed meeting ERLING ARRAZOLA and look forward to participating in their care.  A copy of this report was sent to the requesting provider on this date.  Electronically Signed: D. Rowe Mehar, PA-C 03/04/2021, 9:59 AM   I spent a total of 25 minutes    in face to face in clinical consultation, greater than 50% of which was counseling/coordinating care for CT-guided drainage of left lower abdominal fluid collection

## 2021-03-04 NOTE — Progress Notes (Signed)
MEDICATION-RELATED CONSULT NOTE  ? ?IR Procedure Consult - Anticoagulant/Antiplatelet PTA/Inpatient Med List Review by Pharmacist  ? ?Procedure: CT guided drainage catheter placement  ?   ?Completed: 03/04/2021 @ 16:48 ? ?Post-Procedural bleeding risk per IR MD assessment:  Standard ? ?Antithrombotic medications on inpatient or PTA profile prior to procedure:   Enoxaparin 60mg  (~ 0.5 mg/kg) SQ q24h.  Order was not discontinued.  Last admin 3/1 at 21:38. ? ?Recommended restart time per IR Post-Procedure Guidelines:  Day + 1 (Next AM) ? ? ?Plan:     ?Continue enoxaparin 60mg  (~ 0.5 mg/kg) SQ q24h - next dose moved to 3/3 at 10:00 AM ? ? ?Gretta Arab PharmD, BCPS ?Clinical Pharmacist ?[facility] [phone] ?03/04/2021 4:56 PM ? ?

## 2021-03-04 NOTE — Assessment & Plan Note (Signed)
Encourage tobacco cessation. ?Nicotine patch as needed ?

## 2021-03-04 NOTE — Assessment & Plan Note (Addendum)
Seems like he was recently started on memantine.  Not sure if this was related to his whole brain radiation. ?-Continue home memantine. ?

## 2021-03-04 NOTE — Procedures (Signed)
Pre procedural Dx: Diverticular abscess ?Post procedural Dx: Same ? ?Technically successful CT guided placed of a 12 Fr drainage catheter placement into the left lower abdomen yielding 20 cc of slightly bloody foul smelling fluid.   ? ?A representative aspirated sample was capped and sent to the laboratory for analysis.   ? ?EBL: Trace ?Complications: None immediate ? ?Ronny Bacon, MD ?Pager #: [phone] ? ? ?

## 2021-03-04 NOTE — TOC Initial Note (Signed)
Transition of Care (TOC) - Initial/Assessment Note  ? ? ?Patient Details  ?Name: Raymond [name] ?MRN: [id number] ?Date of Birth: April 08, 1960 ? ?Transition of Care (TOC) CM/SW Contact:    ?Tawanna Cooler, RN ?Phone Number: ?03/04/2021, 11:07 AM ? ?Clinical Narrative:                 ? ? ?Transition of Care (TOC) Screening Note ? ? ?Patient Details  ?Name: Raymond [name] ?Date of Birth: March 22, 1960 ? ? ?Transition of Care (TOC) CM/SW Contact:    ?Tawanna Cooler, RN ?Phone Number: ?03/04/2021, 11:07 AM ? ? ?Transition of Care Department [facility]) has reviewed patient and no TOC needs have been identified at this time. We will continue to monitor patient advancement through interdisciplinary progression rounds. If new patient transition needs arise, please place a TOC consult. ?  ? ?Expected Discharge Plan: Home/Self Care ?Barriers to Discharge: Continued Medical Work up ? ? ?Patient Goals and CMS Choice ?Patient states their goals for this hospitalization and ongoing recovery are:: home ?  ?  ? ?Expected Discharge Plan and Services ?Expected Discharge Plan: Home/Self Care ?  ?Discharge Planning Services: CM Consult ?  ?  ?                ?  ?  ?  ?  ?  ?  ?  ?  ?  ?  ? ?Prior Living Arrangements/Services ?  ?Lives with:: Spouse ?Patient language and need for interpreter reviewed:: Yes ?       ?Need for Family Participation in Patient Care: Yes (Comment) ?Care giver support system in place?: Yes (comment) ?  ?Criminal Activity/Legal Involvement Pertinent to Current Situation/Hospitalization: No - Comment as needed ? ?Activities of Daily Living ?Home Assistive Devices/Equipment: None ?ADL Screening (condition at time of admission) ?Patient's cognitive ability adequate to safely complete daily activities?: Yes ?Is the patient deaf or have difficulty hearing?: Yes ?Does the patient have difficulty seeing, even when wearing glasses/contacts?: No ?Does the patient have difficulty concentrating, remembering, or making decisions?:  No ?Patient able to express need for assistance with ADLs?: Yes ?Does the patient have difficulty dressing or bathing?: No ?Independently performs ADLs?: Yes (appropriate for developmental age) ?Does the patient have difficulty walking or climbing stairs?: No ?Weakness of Legs: None ?Weakness of Arms/Hands: None ? ?Permission Sought/Granted ?  ?  ?   ?   ?   ?   ? ?Emotional Assessment ?  ?  ?  ?Orientation: : Oriented to Self, Oriented to Place, Oriented to  Time, Oriented to Situation ?Alcohol / Substance Use: Tobacco Use ?Psych Involvement: No (comment) ? ?Admission diagnosis:  Abdominal fluid collection [R18.8] ?Diverticulitis of intestine with abscess [K57.80] ?Abdominal pain, unspecified abdominal location [R10.9] ?Patient Active Problem List  ? Diagnosis Date Noted  ? Diverticulitis of intestine with abscess 03/03/2021  ? Abscess of sigmoid colon due to diverticulitis 03/03/2021  ? Depression 03/03/2021  ? 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 Hearing loss 03/01/2021  ? Lower urinary tract symptoms (LUTS) 01/27/2021  ? RLS (restless legs syndrome) 01/27/2021  ? Colitis presumed infectious 01/25/2021  ? Hypokalemia 01/25/2021  ? Sepsis ([place]) 01/24/2021  ? Hypertension 11/02/2020  ? 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 Malignant neoplasm of upper lobe of left lung ([place]) 09/24/2020  ? Goals of care, counseling/discussion 09/24/2020  ? Mass of left lung 09/14/2020  ? Acute diverticulitis 09/01/2020  ? Hx of diverticulitis of colon 05/21/2019  ?  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 Baker cyst 02/28/2015  ? Chest pain, exertional 02/28/2015  ? Baker's cyst 02/28/2015  ? Abnormal EKG 02/28/2015  ? MICROSCOPIC HEMATURIA 10/17/2007  ? ABDOMINAL PAIN 10/17/2007  ? HEMATURIA, HX OF 10/17/2007  ? PSYCHOLOGICAL STRESS 05/31/2007  ? Obesity, unspecified 04/19/2007  ? TOBACCO ABUSE 04/19/2007  ? RHINITIS, ALLERGIC NOS 04/19/2006  ? HYPERTENSION, BENIGN 03/09/2006  ? FATIGUE 03/09/2006  ?  WEIGHT GAIN 03/09/2006  ? HEADACHE 03/09/2006  ? ANXIETY 03/02/2006  ? Chest pain 03/02/2006  ? ?PCP:  Pcp, No ?Pharmacy:   ?[place] [address] ?[place] [address] ?Phone: [phone] Fax: [phone] ? ?[place], [place] - [address] ?[place] ?[place] [address] ?Phone: [phone] Fax: [phone] ? ?[facility] ?[address] ?[place] [address] ?Phone: [phone] Fax: [phone] ? ?[facility] ?[address] ?[place] [address] ?Phone: [phone] Fax: [phone] ? ? ? ? ?Social Determinants of Health (SDOH) Interventions ?  ? ?Readmission Risk Interventions ?Readmission Risk Prevention Plan 03/04/2021  ?Transportation Screening Complete  ?PCP or Specialist Appt within 3-5 Days Complete  ?[place] or Home Care Consult Complete  ?Social Work Consult for [place] Planning/Counseling Complete  ?Palliative Care Screening Not Applicable  ?Medication Review Press photographer) Complete  ?Some recent data might be hidden  ? ? ? ?

## 2021-03-04 NOTE — Assessment & Plan Note (Addendum)
Continue home gabapentin ?

## 2021-03-04 NOTE — Progress Notes (Signed)
PROGRESS NOTE  Raymond [name] KZL:[id number] DOB: 10-15-1960   PCP: Pcp, No  Patient is from: Home.  Lives with his wife.  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  Started on IV Zosyn.  General surgery and IR consulted.  Plan for IR drain placement on 3/2.    Subjective: Seen and examined earlier this morning.  No major events overnight of this morning.  No complaints.  Continues to endorse LLQ pain.  He rates the pain 7/10.  Describes this pain as sharp.  He also reports dysuria but denies fecal material or pus in urine.  Denies melena or hematochezia.  Objective: Vitals:   03/04/21 0518 03/04/21 0900 03/04/21 1619 03/04/21 1635  BP: (!) 152/84 (!) 157/84 140/86 (!) 144/73  Pulse: 85 95 91 84  Resp: 18 19 12 14   Temp: 98.4 F (36.9 C) 98.1 F (36.7 C)    TempSrc: Oral Oral    SpO2: 94% 94% 96% 100%  Weight:      Height:        Examination:  GENERAL: No apparent distress.  Nontoxic. HEENT: MMM.  Vision and hearing grossly intact.  NECK: Supple.  No apparent JVD.  RESP:  No IWOB.  Fair aeration bilaterally. CVS:  RRR. Heart sounds normal.  ABD/GI/GU: BS+. Abd soft, NTND.  LLQ tenderness. MSK/EXT:  Moves extremities. No apparent deformity. No edema.  SKIN: no apparent skin lesion or wound NEURO: Awake, alert and oriented appropriately.  No apparent focal neuro deficit. PSYCH: Calm. Normal affect.   Procedures:  None  Microbiology summarized: [id number] and influenza PCR nonreactive.  Assessment and Plan: * Abscess of sigmoid colon due to  diverticulitis Deemed to be not a surgical candidate by general surgery -IR to place drain -Continue IV Zosyn -Continue IV fluid and pain control -N.p.o. pending drain placement  Cognitive impairment Seems like he was recently started on memantine.  Not sure if this was related to his whole brain radiation. -We will resume home memantine once he started taking p.o.  Anxiety and depression -Continue Zoloft and Remeron.  RLS (restless legs syndrome) Continue home gabapentin once he starts taking p.o.  Hypertension BP slightly elevated. -Increase amlodipine to 10 mg daily -Hydralazine as needed  Adenocarcinoma of left lung, stage 4 (HCC) Status postchemotherapy and whole brain radiation. -Outpatient follow-up with Dr. Julien Nordmann.  TOBACCO ABUSE Encourage tobacco cessation. Nicotine patch as needed  Obesity, unspecified Body mass index is 35.43 kg/m. -Encourage lifestyle change to lose weight  Increased nutrient needs Body mass index is 35.43 kg/m. Nutrition Problem: Increased nutrient needs Etiology: cancer and cancer related treatments Signs/Symptoms: estimated needs Interventions: Ensure Enlive (each supplement provides 350kcal and 20 grams of protein), MVI   DVT prophylaxis:  SCD pending IR procedure  Code Status: Full code Family Communication: Patient and/or RN. Available if any question.  Level of care: Med-Surg Status is: Inpatient Remains inpatient appropriate because: Sigmoid diverticulitis with abscess requiring IV fluid and IR drain placement     Final disposition: TBD   Consultants:  General surgery Interventional radiology   Sch Meds:  Scheduled Meds:  amLODipine  5 mg Oral Daily   docusate sodium  100 mg Oral BID   enoxaparin (LOVENOX) injection  60 mg Subcutaneous Q24H   [START ON 03/05/2021] feeding supplement  237 mL Oral BID BM   fentaNYL       folic acid  1 mg Oral Daily   gabapentin  300 mg Oral QHS   memantine  10 mg Oral BID    midazolam       mirtazapine  30 mg Oral QHS   [START ON 03/05/2021] multivitamin with minerals  1 tablet Oral Daily   tamsulosin  0.4 mg Oral QPC supper   Continuous Infusions:  lactated ringers 125 mL/hr at 03/04/21 1146   piperacillin-tazobactam (ZOSYN)  IV 3.375 g (03/04/21 1508)   PRN Meds:.acetaminophen **OR** acetaminophen, fentaNYL, HYDROmorphone (DILAUDID) injection, lidocaine (PF), midazolam, ondansetron **OR** ondansetron (ZOFRAN) IV, prochlorperazine  Antimicrobials: Anti-infectives (From admission, onward)    Start     Dose/Rate Route Frequency Ordered Stop   03/03/21 2200  piperacillin-tazobactam (ZOSYN) IVPB 3.375 g       See Hyperspace for full Linked Orders Report.   3.375 g 12.5 mL/hr over 240 Minutes Intravenous Every 8 hours 03/03/21 1600     03/03/21 1600  piperacillin-tazobactam (ZOSYN) IVPB 3.375 g       See Hyperspace for full Linked Orders Report.   3.375 g 100 mL/hr over 30 Minutes Intravenous  Once 03/03/21 1600 03/04/21 0700        I have personally reviewed the following labs and images: CBC: Recent Labs  Lab 03/01/21 0910 03/03/21 0958 03/04/21 0523  WBC 1.4* 7.4 12.7*  NEUTROABS 0.2* 4.6  --   HGB 11.5* 13.5 11.5*  HCT 35.6* 41.1 36.9*  MCV 93.9 94.5 97.4  PLT 121* 160 152   BMP &GFR Recent Labs  Lab 03/01/21 0910 03/03/21 0958 03/04/21 0523  NA 139 137 138  K 4.0 3.8 4.5  CL 105 103 102  CO2 30 26 30   GLUCOSE 99 116* 99  BUN 11 17 16   CREATININE 0.80 1.33* 0.98  CALCIUM 9.4 9.5 8.8*  MG  --   --  2.1  PHOS  --   --  3.7   Estimated Creatinine Clearance: 106.1 mL/min (by C-G formula based on SCr of 0.98 mg/dL). Liver & Pancreas: Recent Labs  Lab 03/01/21 0910 03/03/21 0958 03/04/21 0523  AST 22 28 22   ALT 16 19 16   ALKPHOS 96 94 88  BILITOT 0.8 0.4 0.2*  PROT 7.2 7.5 6.6  ALBUMIN 3.7 3.5 3.0*   Recent Labs  Lab 03/03/21 0958  LIPASE 21   No results for input(s): AMMONIA in the last 168 hours. Diabetic: No  results for input(s): HGBA1C in the last 72 hours. No results for input(s): GLUCAP in the last 168 hours. Cardiac Enzymes: No results for input(s): CKTOTAL, CKMB, CKMBINDEX, TROPONINI in the last 168 hours. No results for input(s): PROBNP in the last 8760 hours. Coagulation Profile: Recent Labs  Lab 03/04/21 0523  INR 1.1   Thyroid Function Tests: No results for input(s): TSH, T4TOTAL, FREET4, T3FREE, THYROIDAB in the last 72 hours. Lipid Profile: No results for input(s): CHOL, HDL, LDLCALC, TRIG, CHOLHDL, LDLDIRECT in the last 72 hours. Anemia Panel: No results for input(s): VITAMINB12, FOLATE, FERRITIN, TIBC, IRON, RETICCTPCT in the last 72 hours. Urine analysis:    Component Value Date/Time   COLORURINE AMBER (A) 03/03/2021 1249   APPEARANCEUR HAZY (A) 03/03/2021 1249   LABSPEC 1.024  03/03/2021 1249   PHURINE 5.0 03/03/2021 1249   GLUCOSEU NEGATIVE 03/03/2021 1249   HGBUR NEGATIVE 03/03/2021 1249   HGBUR moderate 10/17/2007 1442   BILIRUBINUR NEGATIVE 03/03/2021 1249   KETONESUR 5 (A) 03/03/2021 1249   PROTEINUR 100 (A) 03/03/2021 1249   UROBILINOGEN 1.0 07/17/2013 1048   NITRITE NEGATIVE 03/03/2021 1249   LEUKOCYTESUR NEGATIVE 03/03/2021 1249   Sepsis Labs: Invalid input(s): PROCALCITONIN, [place]  Microbiology: Recent Results (from the past 240 hour(s))  Resp Panel by RT-PCR (Flu A&B, Covid) Nasopharyngeal Swab     Status: None   Collection Time: 03/03/21 10:00 AM   Specimen: Nasopharyngeal Swab; Nasopharyngeal(NP) swabs in vial transport medium  Result Value Ref Range Status   SARS Coronavirus 2 by RT PCR NEGATIVE NEGATIVE Final    Comment: (NOTE) SARS-CoV-2 target nucleic acids are NOT DETECTED.  The SARS-CoV-2 RNA is generally detectable in upper respiratory specimens during the acute phase of infection. The lowest concentration of SARS-CoV-2 viral copies this assay can detect is 138 copies/mL. A negative result does not preclude SARS-Cov-2 infection and  should not be used as the sole basis for treatment or other patient management decisions. A negative result may occur with  improper specimen collection/handling, submission of specimen other than nasopharyngeal swab, presence of viral mutation(s) within the areas targeted by this assay, and inadequate number of viral copies(<138 copies/mL). A negative result must be combined with clinical observations, patient history, and epidemiological information. The expected result is Negative.  Fact Sheet for Patients:  EntrepreneurPulse.com.au  Fact Sheet for Healthcare Providers:  IncredibleEmployment.be  This test is no t yet approved or cleared by the [place] FDA and  has been authorized for detection and/or diagnosis of SARS-CoV-2 by FDA under an Emergency Use Authorization (EUA). This EUA will remain  in effect (meaning this test can be used) for the duration of the COVID-19 declaration under Section 564(b)(1) of the Act, 21 U.S.C.section 360bbb-3(b)(1), unless the authorization is terminated  or revoked sooner.       Influenza A by PCR NEGATIVE NEGATIVE Final   Influenza B by PCR NEGATIVE NEGATIVE Final    Comment: (NOTE) The Xpert Xpress SARS-CoV-2/FLU/RSV plus assay is intended as an aid in the diagnosis of influenza from Nasopharyngeal swab specimens and should not be used as a sole basis for treatment. Nasal washings and aspirates are unacceptable for Xpert Xpress SARS-CoV-2/FLU/RSV testing.  Fact Sheet for Patients: EntrepreneurPulse.com.au  Fact Sheet for Healthcare Providers: IncredibleEmployment.be  This test is not yet approved or cleared by the [place] FDA and has been authorized for detection and/or diagnosis of SARS-CoV-2 by FDA under an Emergency Use Authorization (EUA). This EUA will remain in effect (meaning this test can be used) for the duration of the COVID-19 declaration  under Section 564(b)(1) of the Act, 21 U.S.C. section 360bbb-3(b)(1), unless the authorization is terminated or revoked.  Performed at [facility], [place] [address] [place], [place] [address]     Radiology Studies: No results found.    Shailee Foots T. Belfonte  If 7PM-7AM, please contact night-coverage [email or web address] 03/04/2021, 4:44 PM

## 2021-03-04 NOTE — Progress Notes (Addendum)
Patient ID: Raymond [name], male   DOB: [birth year], 61 y.o.   MRN: [id number] ?[facility] ?Progress Note ? ?   ?Subjective: ?CC-  ?Feels about the same as yesterday. Continues to have some left sided abdominal pain. ?IR planning drain placement today. ?WBC up 12.7, afebrile ? ?Objective: ?Vital signs in last 24 hours: ?Temp:  [98.1 ?F (36.7 ?C)-98.4 ?F (36.9 ?C)] 98.1 ?F (36.7 ?C) (03/02 0900) ?Pulse Rate:  [82-95] 95 (03/02 0900) ?Resp:  [9-19] 19 (03/02 0900) ?BP: (120-172)/(68-100) 157/84 (03/02 0900) ?SpO2:  [82 %-95 %] 94 % (03/02 0900) ?Last BM Date : 03/02/21 ? ?Intake/Output from previous day: ?03/01 0701 - 03/02 0700 ?In: 1795.3 [P.O.:240; I.V.:1500; IV Piggyback:55.3] ?Out: 225 [Urine:225] ?Intake/Output this shift: ?Total I/O ?In: -  ?Out: 350 [Urine:350] ? ?PE: ?Gen:  Alert, NAD, pleasant ?Abd: soft, ND, +BS, no masses, hernias, or organomegaly. TTP LLQ without peritonitis ? ?Lab Results:  ?Recent Labs  ?  03/03/21 ?8182 03/04/21 ?9937  ?WBC 7.4 12.7*  ?HGB 13.5 11.5*  ?HCT 41.1 36.9*  ?PLT 160 152  ? ?BMET ?Recent Labs  ?  03/03/21 ?0958 03/04/21 ?1696  ?NA 137 138  ?K 3.8 4.5  ?CL 103 102  ?CO2 26 30  ?GLUCOSE 116* 99  ?BUN 17 16  ?CREATININE 1.33* 0.98  ?CALCIUM 9.5 8.8*  ? ?PT/INR ?Recent Labs  ?  03/04/21 ?7893  ?LABPROT 14.0  ?INR 1.1  ? ?CMP  ?   ?Component Value Date/Time  ? NA 138 03/04/2021 0523  ? K 4.5 03/04/2021 0523  ? CL 102 03/04/2021 0523  ? CO2 30 03/04/2021 0523  ? GLUCOSE 99 03/04/2021 0523  ? BUN 16 03/04/2021 0523  ? CREATININE 0.98 03/04/2021 0523  ? CREATININE 0.80 03/01/2021 0910  ? CALCIUM 8.8 (L) 03/04/2021 0523  ? PROT 6.6 03/04/2021 0523  ? ALBUMIN 3.0 (L) 03/04/2021 0523  ? ALBUMIN 4.0 05/20/2019 1951  ? AST 22 03/04/2021 0523  ? AST 22 03/01/2021 0910  ? ALT 16 03/04/2021 0523  ? ALT 16 03/01/2021 0910  ? ALKPHOS 88 03/04/2021 0523  ? BILITOT 0.2 (L) 03/04/2021 0523  ? BILITOT 0.8 03/01/2021 0910  ? GFRNONAA >60 03/04/2021 0523  ? GFRNONAA >60 03/01/2021 0910   ? GFRAA >60 05/25/2019 0427  ? ?Lipase  ?   ?Component Value Date/Time  ? LIPASE 21 03/03/2021 0958  ? ? ? ? ? ?Studies/Results: ?CT Abdomen Pelvis W Contrast ? ?Result Date: 03/03/2021 ?CLINICAL DATA:  Nonlocalized abdominal pain, history of lung cancer EXAM: CT ABDOMEN AND PELVIS WITH CONTRAST TECHNIQUE: Multidetector CT imaging of the abdomen and pelvis was performed using the standard protocol following bolus administration of intravenous contrast. RADIATION DOSE REDUCTION: This exam was performed according to the departmental dose-optimization program which includes automated exposure control, adjustment of the mA and/or kV according to patient size and/or use of iterative reconstruction technique. CONTRAST:  159mL OMNIPAQUE IOHEXOL 300 MG/ML  SOLN COMPARISON:  CT abdomen and pelvis dated January 24, 2021; abdominal ultrasound dated January 24, 2021. FINDINGS: Lower chest: Numerous small solid pulmonary nodules, unchanged compared to prior exam. Hepatobiliary: New small low-attenuation liver lesions. Lesion of the right hepatic dome measuring 9 mm on series 2, image 13. Lesion of the left hepatic dome measuring 6 mm on image 13. Cholelithiasis. Unchanged mild gallbladder wall thickening, finding previously evaluated with ultrasound and likely due to adenomyomatosis. No biliary ductal dilation. Pancreas: Unremarkable. No pancreatic ductal dilatation or surrounding inflammatory changes. Spleen: Normal in size without  focal abnormality. Adrenals/Urinary Tract: Unchanged thickening of the left adrenal gland. Right adrenal gland is unremarkable. No hydronephrosis. Punctate nonobstructing bilateral renal stones. Bladder is unremarkable. Stomach/Bowel: Wall thickening of the sigmoid colon with surrounding inflammatory change. Associated fluid collection with air-fluid level seen adjacent to the wall of the sigmoid colon measuring 4.8 x 3.1 cm. Vascular/Lymphatic: Aortic atherosclerosis. No enlarged abdominal or pelvic  lymph nodes. Reproductive: Mild prostatomegaly. Other: No abdominal wall hernia or abnormality. No abdominopelvic ascites. Musculoskeletal: Moderate degenerative changes of the lumbar spine. No aggressive osseous lesions. IMPRESSION: 1. New fluid collection is seen in the left lower quadrant adjacent to the wall the sigmoid colon which measures 4.8 x 3.1 cm, likely due to abscess. 2. Persistent wall thickening of the sigmoid colon with surrounding inflammatory change, finding has been present on multiple prior exams, differential considerations include acute on chronic diverticulitis or malignancy, correlate with recent colonoscopy. 3. Numerous small solid pulmonary nodules of the partially visualized lung bases, unchanged compared to prior exam. 4. New subcentimeter lesions of the liver dome, concerning for progressive metastatic disease. 5. Aortic Atherosclerosis (ICD10-I70.0). Electronically Signed   By: Yetta Glassman M.D.   On: 03/03/2021 13:58   ? ?Anti-infectives: ?Anti-infectives (From admission, onward)  ? ? Start     Dose/Rate Route Frequency Ordered Stop  ? 03/03/21 2200  piperacillin-tazobactam (ZOSYN) IVPB 3.375 g       ?See Hyperspace for full Linked Orders Report.  ? 3.375 g ?12.5 mL/hr over 240 Minutes Intravenous Every 8 hours 03/03/21 1600    ? 03/03/21 1600  piperacillin-tazobactam (ZOSYN) IVPB 3.375 g       ?See Hyperspace for full Linked Orders Report.  ? 3.375 g ?100 mL/hr over 30 Minutes Intravenous  Once 03/03/21 1600 03/03/21 1917  ? ?  ? ? ? ?Assessment/Plan ?Sigmoid diverticulitis with abscess ?- CT scan 3/1 shows a new 4.8x3.1cm fluid collection in the left lower quadrant adjacent to the wall of the sigmoid colon, likely due to abscess; persistent wall thickening of the sigmoid colon with surrounding inflammatory changes.  ?- IR consult for drain placement ?- continue antibiotics ?- ok for clear liquids from our standpoint after procedure ?  ?ID - zosyn 3/1>> ?VTE - ok for chemical dvt  ppx from surgical standpoint after procedure ?FEN - IVF, NPO ?Foley - none ?  ?Metastatic lung cancer on chemo, whole brain radiation ?Obesity BMI 35.43 ?HTN ?GERD ?OSA ?MI in 2006 ? ?I reviewed ED provider notes, hospitalist notes, last 24 h vitals and pain scores, last 48 h intake and output, last 24 h labs and trends, and last 24 h imaging results. ? ?This care required moderate level of medical decision making.  ? ? ? LOS: 1 day  ? ? ?Wellington Hampshire, PA-C ?[facility] ?03/04/2021, 10:41 AM ?Please see Amion for pager number during day hours 7:00am-4:30pm ? ?

## 2021-03-04 NOTE — Progress Notes (Signed)
Initial Nutrition Assessment ? ?DOCUMENTATION CODES:  ? ?Obesity unspecified ? ?INTERVENTION:  ? ?Once diet advanced: ?-Ensure Plus High Protein po TID, each supplement -Chocolate +chocolate ice cream ? ?NUTRITION DIAGNOSIS:  ? ?Increased nutrient needs related to cancer and cancer related treatments as evidenced by estimated needs. ? ?GOAL:  ? ?Patient will meet greater than or equal to 90% of their needs ? ?MONITOR:  ? ?Diet advancement, Labs, Weight trends, I & O's ? ?REASON FOR ASSESSMENT:  ? ?Malnutrition Screening Tool ?  ? ?ASSESSMENT:  ? ?61 y.o. male with PMH significant for metastatic lung cancer currently getting chemotherapy and whole brain radiation,  he presented in the ED with c/o: Left lower quadrant abdominal pain for 2 weeks.  Patient reports he developed pain in LLQ 2 weeks ago after he completed his last dose of his chemotherapy, he was able to manage pain with over-the-counter pain medications but it has gotten worse and now he has developed associated nausea and vomiting. ? ?Patient reports he has not been eating well d/t taste changes (metallic tastes,dry mouth), smell aversions to food and decreased appetite. States he does eat less than usual. Pt has been having abdominal pain which is now r/t diverticulitis flare. ? ?Pt states he has been subsisting mainly on 2 Ensures daily mixed with ice cream and water. ? ?Pt currently NPO, awaiting IR to place drain. ? ?Pt followed by Elverta RD, last seen during last chemotherapy treatment on 2/23. S/p XRT on 2/8. ? ?Pt became very upset during visit regarding the side effects he is now experiencing. Offered to contact spiritual care for patient but he declines, he is a Company secretary himself.  ? ?Pt reports UBW of 294 lbs. ?Per weight records, pt has lost 12 lbs since 2/23 (4% wt loss x 1 week, significant for time frame).  ? ?Medications: Folic acid, Remeron, Lactated ringers ? ?Labs reviewed. ? ?NUTRITION - FOCUSED PHYSICAL EXAM: ? ?Flowsheet Row  Most Recent Value  ?Orbital Region No depletion  ?Upper Arm Region No depletion  ?Thoracic and Lumbar Region No depletion  ?Buccal Region No depletion  ?Temple Region No depletion  ?Clavicle Bone Region No depletion  ?Clavicle and Acromion Bone Region No depletion  ?Scapular Bone Region No depletion  ?Dorsal Hand No depletion  ?Patellar Region No depletion  ?Anterior Thigh Region No depletion  ?Posterior Calf Region No depletion  ?Edema (RD Assessment) None  ?Hair Reviewed  [no hair]  ?Eyes Reviewed  ?Mouth Reviewed  [dry, pale tongue]  ?Skin Reviewed  ?Nails Reviewed  ? ?  ? ? ?Diet Order:   ?Diet Order   ? ?       ?  Diet NPO time specified  Diet effective midnight       ?  ? ?  ?  ? ?  ? ? ?EDUCATION NEEDS:  ? ?No education needs have been identified at this time ? ?Skin:  Skin Assessment: Reviewed RN Assessment ? ?Last BM:  2/28 ? ?Height:  ? ?Ht Readings from Last 1 Encounters:  ?03/03/21 5\' 11"  (1.803 m)  ? ? ?Weight:  ? ?Wt Readings from Last 1 Encounters:  ?03/03/21 115.2 kg  ? ? ?BMI:  Body mass index is 35.43 kg/m?. ? ?Estimated Nutritional Needs:  ? ?Kcal:  2300-2500 ? ?Protein:  115-125g ? ?Fluid:  2.3L/day ? ?Clayton Bibles, MS, RD, LDN ?Inpatient Clinical Dietitian ?Contact information available via Amion ? ?

## 2021-03-05 DIAGNOSIS — D649 Anemia, unspecified: Secondary | ICD-10-CM

## 2021-03-05 DIAGNOSIS — R040 Epistaxis: Secondary | ICD-10-CM

## 2021-03-05 LAB — CBC
HCT: 32.5 % — ABNORMAL LOW (ref 39.0–52.0)
Hemoglobin: 10.5 g/dL — ABNORMAL LOW (ref 13.0–17.0)
MCH: 31.1 pg (ref 26.0–34.0)
MCHC: 32.3 g/dL (ref 30.0–36.0)
MCV: 96.2 fL (ref 80.0–100.0)
Platelets: 144 10*3/uL — ABNORMAL LOW (ref 150–400)
RBC: 3.38 MIL/uL — ABNORMAL LOW (ref 4.22–5.81)
RDW: 12.5 % (ref 11.5–15.5)
WBC: 15.6 10*3/uL — ABNORMAL HIGH (ref 4.0–10.5)
nRBC: 0.3 % — ABNORMAL HIGH (ref 0.0–0.2)

## 2021-03-05 LAB — RENAL FUNCTION PANEL
Albumin: 2.8 g/dL — ABNORMAL LOW (ref 3.5–5.0)
Anion gap: 5 (ref 5–15)
BUN: 9 mg/dL (ref 6–20)
CO2: 30 mmol/L (ref 22–32)
Calcium: 8.2 mg/dL — ABNORMAL LOW (ref 8.9–10.3)
Chloride: 101 mmol/L (ref 98–111)
Creatinine, Ser: 0.78 mg/dL (ref 0.61–1.24)
GFR, Estimated: >60 mL/min (ref >60)
Glucose, Bld: 101 mg/dL — ABNORMAL HIGH (ref 70–99)
Phosphorus: 3.6 mg/dL (ref 2.5–4.6)
Potassium: 3.9 mmol/L (ref 3.5–5.1)
Sodium: 136 mmol/L (ref 135–145)

## 2021-03-05 LAB — MAGNESIUM: Magnesium: 2.1 mg/dL (ref 1.7–2.4)

## 2021-03-05 MED ORDER — LORATADINE 10 MG PO TABS
10.0000 mg | ORAL_TABLET | Freq: Every day | ORAL | Status: DC
  Administered 2021-03-05 – 2021-03-09 (×5): 10 mg via ORAL
  Filled 2021-03-05 (×6): qty 1

## 2021-03-05 MED ORDER — HYDRALAZINE HCL 25 MG PO TABS
25.0000 mg | ORAL_TABLET | Freq: Four times a day (QID) | ORAL | Status: DC | PRN
Start: 2021-03-05 — End: 2021-03-09

## 2021-03-05 MED ORDER — SALINE SPRAY 0.65 % NA SOLN
2.0000 | NASAL | Status: DC | PRN
  Filled 2021-03-05: qty 44

## 2021-03-05 MED ORDER — AMLODIPINE BESYLATE 10 MG PO TABS
10.0000 mg | ORAL_TABLET | Freq: Every day | ORAL | Status: DC
  Administered 2021-03-06 – 2021-03-09 (×4): 10 mg via ORAL
  Filled 2021-03-05 (×4): qty 1

## 2021-03-05 MED ORDER — OXYMETAZOLINE HCL 0.05 % NA SOLN: 2.0000 | Freq: Two times a day (BID) | NASAL | Status: AC | PRN

## 2021-03-05 MED ORDER — OXYCODONE HCL 5 MG PO TABS
5.0000 mg | ORAL_TABLET | ORAL | Status: DC | PRN
  Administered 2021-03-05 – 2021-03-08 (×6): 10 mg via ORAL
  Administered 2021-03-09: 5 mg via ORAL
  Filled 2021-03-05 (×5): qty 2
  Filled 2021-03-05: qty 1
  Filled 2021-03-05: qty 2

## 2021-03-05 NOTE — Assessment & Plan Note (Addendum)
Recent Labs  ?  01/27/21 ?0357 02/15/21 ?0927 02/23/21 ?1020 03/01/21 ?0910 03/03/21 ?0958 03/04/21 ?2229 03/05/21 ?7989 03/06/21 ?2119 03/07/21 ?4174 03/08/21 ?0507  ?HGB 9.4* 11.0* 12.0* 11.5* 13.5 11.5* 10.5* 10.9* 10.1* 10.3*  ?Hgb dropped about 2 g.  Now stable. ?-Repeat CBC with differential at follow-up. ?

## 2021-03-05 NOTE — Progress Notes (Signed)
Patient ID: Raymond [name], male   DOB: 1960/10/07, 61 y.o.   MRN: [id number] [facility] Surgery Progress Note     Subjective: CC-  Continues to have LLQ pain. Also sore from drain placement during which 20cc of slightly bloody foul smelling fluid was removed. Denies n/v. Passing flatus, no BM. WBC up 15, TMAX 99.3.  Objective: Vital signs in last 24 hours: Temp:  [99.1 F (37.3 C)-99.3 F (37.4 C)] 99.1 F (37.3 C) (03/03 0435) Pulse Rate:  [81-92] 91 (03/03 0435) Resp:  [7-30] 18 (03/03 0435) BP: (140-156)/(68-86) 142/81 (03/03 0435) SpO2:  [92 %-100 %] 98 % (03/03 0435) Last BM Date : 03/02/21  Intake/Output from previous day: 03/02 0701 - 03/03 0700 In: -  Out: 1110 [Urine:1050; Drains:60] Intake/Output this shift: Total I/O In: -  Out: 350 [Urine:350]  PE: Gen:  Alert, NAD, pleasant Abd: soft, ND, moderate TTP LLQ without peritonitis, drain with bloody fluid in bag  Lab Results:  Recent Labs    03/04/21 0523 03/05/21 0515  WBC 12.7* 15.6*  HGB 11.5* 10.5*  HCT 36.9* 32.5*  PLT 152 144*   BMET Recent Labs    03/04/21 0523 03/05/21 0515  NA 138 136  K 4.5 3.9  CL 102 101  CO2 30 30  GLUCOSE 99 101*  BUN 16 9  CREATININE 0.98 0.78  CALCIUM 8.8* 8.2*   PT/INR Recent Labs    03/04/21 0523  LABPROT 14.0  INR 1.1   CMP     Component Value Date/Time   NA 136 03/05/2021 0515   K 3.9 03/05/2021 0515   CL 101 03/05/2021 0515   CO2 30 03/05/2021 0515   GLUCOSE 101 (H) 03/05/2021 0515   BUN 9 03/05/2021 0515   CREATININE 0.78 03/05/2021 0515   CREATININE 0.80 03/01/2021 0910   CALCIUM 8.2 (L) 03/05/2021 0515   PROT 6.6 03/04/2021 0523   ALBUMIN 2.8 (L) 03/05/2021 0515   ALBUMIN 4.0 05/20/2019 1951   AST 22 03/04/2021 0523   AST 22 03/01/2021 0910   ALT 16 03/04/2021 0523   ALT 16 03/01/2021 0910   ALKPHOS 88 03/04/2021 0523   BILITOT 0.2 (L) 03/04/2021 0523   BILITOT 0.8 03/01/2021 0910   GFRNONAA >60 03/05/2021 0515   GFRNONAA >60  03/01/2021 0910   GFRAA >60 05/25/2019 0427   Lipase     Component Value Date/Time   LIPASE 21 03/03/2021 0958       Studies/Results: CT Abdomen Pelvis W Contrast  Result Date: 03/03/2021 CLINICAL DATA:  Nonlocalized abdominal pain, history of lung cancer EXAM: CT ABDOMEN AND PELVIS WITH CONTRAST TECHNIQUE: Multidetector CT imaging of the abdomen and pelvis was performed using the standard protocol following bolus administration of intravenous contrast. RADIATION DOSE REDUCTION: This exam was performed according to the departmental dose-optimization program which includes automated exposure control, adjustment of the mA and/or kV according to patient size and/or use of iterative reconstruction technique. CONTRAST:  140mL OMNIPAQUE IOHEXOL 300 MG/ML  SOLN COMPARISON:  CT abdomen and pelvis dated January 24, 2021; abdominal ultrasound dated January 24, 2021. FINDINGS: Lower chest: Numerous small solid pulmonary nodules, unchanged compared to prior exam. Hepatobiliary: New small low-attenuation liver lesions. Lesion of the right hepatic dome measuring 9 mm on series 2, image 13. Lesion of the left hepatic dome measuring 6 mm on image 13. Cholelithiasis. Unchanged mild gallbladder wall thickening, finding previously evaluated with ultrasound and likely due to adenomyomatosis. No biliary ductal dilation. Pancreas: Unremarkable. No pancreatic ductal dilatation  or surrounding inflammatory changes. Spleen: Normal in size without focal abnormality. Adrenals/Urinary Tract: Unchanged thickening of the left adrenal gland. Right adrenal gland is unremarkable. No hydronephrosis. Punctate nonobstructing bilateral renal stones. Bladder is unremarkable. Stomach/Bowel: Wall thickening of the sigmoid colon with surrounding inflammatory change. Associated fluid collection with air-fluid level seen adjacent to the wall of the sigmoid colon measuring 4.8 x 3.1 cm. Vascular/Lymphatic: Aortic atherosclerosis. No enlarged  abdominal or pelvic lymph nodes. Reproductive: Mild prostatomegaly. Other: No abdominal wall hernia or abnormality. No abdominopelvic ascites. Musculoskeletal: Moderate degenerative changes of the lumbar spine. No aggressive osseous lesions. IMPRESSION: 1. New fluid collection is seen in the left lower quadrant adjacent to the wall the sigmoid colon which measures 4.8 x 3.1 cm, likely due to abscess. 2. Persistent wall thickening of the sigmoid colon with surrounding inflammatory change, finding has been present on multiple prior exams, differential considerations include acute on chronic diverticulitis or malignancy, correlate with recent colonoscopy. 3. Numerous small solid pulmonary nodules of the partially visualized lung bases, unchanged compared to prior exam. 4. New subcentimeter lesions of the liver dome, concerning for progressive metastatic disease. 5. Aortic Atherosclerosis (ICD10-I70.0). Electronically Signed   By: Yetta Glassman M.D.   On: 03/03/2021 13:58   CT IMAGE GUIDED DRAINAGE BY PERCUTANEOUS CATHETER  Result Date: 03/04/2021 INDICATION: Diverticular abscess. Please perform CT-guided percutaneous drainage catheter placement for infection source control purposes. EXAM: CT IMAGE GUIDED DRAINAGE BY PERCUTANEOUS CATHETER COMPARISON:  CT abdomen pelvis-03/03/2021; 01/24/2021 MEDICATIONS: The patient is currently admitted to the hospital and receiving intravenous antibiotics. The antibiotics were administered within an appropriate time frame prior to the initiation of the procedure. ANESTHESIA/SEDATION: Moderate (conscious) sedation was employed during this procedure as administered by the Interventional Radiology RN. A total of Versed 2 mg and Fentanyl 100 mcg was administered intravenously. Moderate Sedation Time: 14 minutes. The patient's level of consciousness and vital signs were monitored continuously by radiology nursing throughout the procedure under my direct supervision. CONTRAST:  None  COMPLICATIONS: None immediate. PROCEDURE: RADIATION DOSE REDUCTION: This exam was performed according to the departmental dose-optimization program which includes automated exposure control, adjustment of the mA and/or kV according to patient size and/or use of iterative reconstruction technique. Informed written consent was obtained from the patient after a discussion of the risks, benefits and alternatives to treatment. The patient was placed supine on the CT gantry and a pre procedural CT was performed re-demonstrating the known abscess/fluid collection within the left lower abdomen/pelvis, adjacent to the posterolateral aspect of the proximal sigmoid colon with dominant collection measuring approximately 4.7 x 4.3 cm (image 20, series 4). The procedure was planned. A timeout was performed prior to the initiation of the procedure. The skin overlying the anterolateral aspect of the lower abdomen/pelvis was prepped and draped in the usual sterile fashion. The overlying soft tissues were anesthetized with 1% lidocaine with epinephrine. Appropriate trajectory was planned with the use of a 22 gauge spinal needle. An 18 gauge trocar needle was advanced into the abscess/fluid collection and a short Amplatz super stiff wire was coiled within the collection. Appropriate positioning was confirmed with a limited CT scan. The tract was serially dilated allowing placement of a 12 [place] all-purpose drainage catheter. Appropriate positioning was confirmed with a limited postprocedural CT scan. Approximally 20 ml of bloody, foul-smelling fluid was aspirated. The tube was connected to a drainage bag and sutured in place. A dressing was placed. The patient tolerated the procedure well without immediate post procedural complication. IMPRESSION: Successful  CT guided placement of a 12 [place] all purpose drain catheter into the left lower abdominal/pelvic diverticular abscess with aspiration of 20 mL of purulent fluid. Samples were  sent to the laboratory as requested by the ordering clinical team. Electronically Signed   By: Sandi Mariscal M.D.   On: 03/04/2021 17:18    Anti-infectives: Anti-infectives (From admission, onward)    Start     Dose/Rate Route Frequency Ordered Stop   03/03/21 2200  piperacillin-tazobactam (ZOSYN) IVPB 3.375 g       See Hyperspace for full Linked Orders Report.   3.375 g 12.5 mL/hr over 240 Minutes Intravenous Every 8 hours 03/03/21 1600     03/03/21 1600  piperacillin-tazobactam (ZOSYN) IVPB 3.375 g       See Hyperspace for full Linked Orders Report.   3.375 g 100 mL/hr over 30 Minutes Intravenous  Once 03/03/21 1600 03/04/21 0700        Assessment/Plan Sigmoid diverticulitis with abscess - CT scan 3/1 shows a new 4.8x3.1cm fluid collection in the left lower quadrant adjacent to the wall of the sigmoid colon, likely due to abscess; persistent wall thickening of the sigmoid colon with surrounding inflammatory changes.  - s/p IR drain 3/2. Culture pending - Continue drain and broad spectrum antibiotics, follow culture. Ok for clear liquids. Mobilize, PT/OT.    ID - zosyn 3/1>> VTE - lovenox FEN - IVF, CLD Foley - none   Metastatic lung cancer on chemo, whole brain radiation Obesity BMI 35.43 HTN GERD OSA MI in 2006  I reviewed hospitalist notes, last 24 h vitals and pain scores, last 48 h intake and output, last 24 h labs and trends, and last 24 h imaging results.  This care required moderate level of medical decision making.     LOS: 2 days    [place] [facility] 03/05/2021, 9:36 AM Please see Amion for pager number during day hours 7:00am-4:30pm

## 2021-03-05 NOTE — Progress Notes (Signed)
PROGRESS NOTE  Raymond [name]:[id number] DOB: 1960-04-30   PCP: Pcp, No  Patient is from: Home.  Lives with his wife.  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  Started on IV Zosyn.  General surgery and IR consulted.  Plan for IR drain placement on 3/2.    Subjective: Seen and examined earlier this morning.  No major events overnight of this morning.  Complains of epistaxis from his nose.  Also complains abdominal pain over the left flank.  Rates his pain 9/10.  Drain output appears bloody.  Objective: Vitals:   03/04/21 2040 03/04/21 2040 03/05/21 0435 03/05/21 1033  BP: (!) 142/68 (!) 142/68 (!) 142/81 (!) 147/72  Pulse: 90 89 91 90  Resp: 18 18 18    Temp: 99.3 F (37.4 C) 99.3 F (37.4 C) 99.1 F (37.3 C)   TempSrc: Oral Oral Oral   SpO2: 92% 94% 98%   Weight:      Height:        Examination:  GENERAL: No apparent distress.  Nontoxic. HEENT: MMM.  Vision and hearing grossly intact. Small fresh red blood in left nare.  No significant extravasation. NECK: Supple.  No apparent JVD.  RESP:  No IWOB.  Fair aeration bilaterally. CVS:  RRR. Heart sounds normal.  ABD/GI/GU: BS+. Abd soft.  LLQ tenderness.  Drain output appears bloody. MSK/EXT:  Moves extremities. No apparent deformity. No edema.  SKIN: no apparent skin lesion or wound NEURO: Awake and alert. Oriented appropriately.  No apparent focal neuro deficit. PSYCH: Calm. Normal affect.   Procedures:  3/2-CT-guided drain placement for diverticular abscess by IR  Microbiology summarized: COVID-19 and  influenza PCR nonreactive. 3/2-abscess culture pending.  Assessment and Plan: * Abscess of sigmoid colon due to diverticulitis S/p CT-guided drain placement on 3/2.  Abscess culture pending. -Continue IV Zosyn -On clear liquid diet -Pain control by general surgery -Drain output looks bloody.  He also have epistaxis.  Discontinue subcu Lovenox.  SCD for VTE prophylaxis  Epistaxis Seems to have some bleeding from his left naris.  Not profuse.  Coag labs within normal.  Slight drop in Hgb probably dilutional from IV fluid. -Wean off oxygen -Saline nasal spray -Afrin nasal spray -Hold subcu Lovenox -SCD for VTE prophylaxis  Normocytic anemia Recent Labs    01/24/21 1200 01/25/21 0344 01/26/21 0405 01/27/21 0357 02/15/21 0927 02/23/21 1020 03/01/21 0910 03/03/21 0958 03/04/21 0523 03/05/21 0515  HGB 10.9* 9.3* 10.4* 9.4* 11.0* 12.0* 11.5* 13.5 11.5* 10.5*  Hgb dropped about 2 g.  Partly dilutional.  Drain output also looks bloody.  He has epistaxis -Discontinue subcu Lovenox -SCD for VT prophylaxis -Continue monitoring  Hypertension BP slightly elevated but improved. -Increased amlodipine to 10 mg daily -Decrease IV fluid -Hydralazine as needed  Cognitive impairment Seems like he was recently started on memantine.  Not sure if this was related to his whole brain radiation. -We will resume home memantine once he started taking p.o.  Anxiety and depression -Continue Zoloft and Remeron.  RLS (restless legs syndrome) Continue home gabapentin once he starts taking p.o.  Adenocarcinoma of left lung, stage 4 (HCC) Status postchemotherapy and whole brain radiation. -Outpatient follow-up with Dr. Julien Nordmann.  TOBACCO ABUSE Encourage tobacco cessation. Nicotine patch as needed  Obesity, unspecified Body mass index is 35.43 kg/m. -Encourage lifestyle change to lose weight  Increased nutrient needs Body mass index is 35.43 kg/m. Nutrition Problem: Increased nutrient  needs Etiology: cancer and cancer related treatments Signs/Symptoms: estimated needs Interventions: Ensure Enlive (each supplement provides 350kcal and 20 grams of protein), MVI   DVT prophylaxis:  Place and maintain sequential compression device Start: 03/05/21 1518  Code Status: Full code Family Communication: Updated patient's wife over the phone. Level of care: Med-Surg Status is: Inpatient Remains inpatient appropriate because: Sigmoid diverticulitis with abscess requiring IV fluid     Final disposition: TBD   Consultants:  General surgery Interventional radiology   Sch Meds:  Scheduled Meds:  [START ON 03/06/2021] amLODipine  10 mg Oral Daily   docusate sodium  100 mg Oral BID   feeding supplement  237 mL Oral BID BM   folic acid  1 mg Oral Daily   gabapentin  300 mg Oral QHS   loratadine  10 mg Oral Daily   memantine  10 mg Oral BID   mirtazapine  30 mg Oral QHS   multivitamin with minerals  1 tablet Oral Daily   sodium chloride flush  5 mL Intracatheter Q8H   tamsulosin  0.4 mg Oral QPC supper   Continuous Infusions:  lactated ringers 125 mL/hr at 03/05/21 1241   piperacillin-tazobactam (ZOSYN)  IV 3.375 g (03/05/21 1406)   PRN Meds:.acetaminophen **OR** acetaminophen, hydrALAZINE, HYDROmorphone (DILAUDID) injection, ondansetron **OR** ondansetron (ZOFRAN) IV, oxyCODONE, oxymetazoline, prochlorperazine, sodium chloride  Antimicrobials: Anti-infectives (From admission, onward)    Start     Dose/Rate Route Frequency Ordered Stop   03/03/21 2200  piperacillin-tazobactam (ZOSYN) IVPB 3.375 g       See Hyperspace for full Linked Orders Report.   3.375 g 12.5 mL/hr over 240 Minutes Intravenous Every 8 hours 03/03/21 1600     03/03/21 1600  piperacillin-tazobactam (ZOSYN) IVPB 3.375 g       See Hyperspace for full Linked Orders Report.   3.375 g 100 mL/hr over 30 Minutes Intravenous  Once 03/03/21 1600 03/04/21 0700        I have personally reviewed the  following labs and images: CBC: Recent Labs  Lab 03/01/21 0910 03/03/21 0958 03/04/21 0523 03/05/21 0515  WBC 1.4* 7.4 12.7* 15.6*  NEUTROABS 0.2* 4.6  --   --   HGB 11.5* 13.5 11.5* 10.5*  HCT 35.6* 41.1 36.9* 32.5*  MCV 93.9 94.5 97.4 96.2  PLT 121* 160 152 144*   BMP &GFR Recent Labs  Lab 03/01/21 0910 03/03/21 0958 03/04/21 0523 03/05/21 0515  NA 139 137 138 136  K 4.0 3.8 4.5 3.9  CL 105 103 102 101  CO2 30 26 30 30   GLUCOSE 99 116* 99 101*  BUN 11 17 16 9   CREATININE 0.80 1.33* 0.98 0.78  CALCIUM 9.4 9.5 8.8* 8.2*  MG  --   --  2.1 2.1  PHOS  --   --  3.7 3.6   Estimated Creatinine Clearance: 130 mL/min (by C-G formula based on SCr of 0.78 mg/dL). Liver & Pancreas: Recent Labs  Lab 03/01/21 0910 03/03/21 0958 03/04/21 0523 03/05/21 0515  AST 22 28 22   --   ALT 16 19 16   --   ALKPHOS 96 94 88  --   BILITOT 0.8 0.4  0.2*  --   PROT 7.2 7.5 6.6  --   ALBUMIN 3.7 3.5 3.0* 2.8*   Recent Labs  Lab 03/03/21 0958  LIPASE 21   No results for input(s): AMMONIA in the last 168 hours. Diabetic: No results for input(s): HGBA1C in the last 72 hours. No results for input(s): GLUCAP in the last 168 hours. Cardiac Enzymes: No results for input(s): CKTOTAL, CKMB, CKMBINDEX, TROPONINI in the last 168 hours. No results for input(s): PROBNP in the last 8760 hours. Coagulation Profile: Recent Labs  Lab 03/04/21 0523  INR 1.1   Thyroid Function Tests: No results for input(s): TSH, T4TOTAL, FREET4, T3FREE, THYROIDAB in the last 72 hours. Lipid Profile: No results for input(s): CHOL, HDL, LDLCALC, TRIG, CHOLHDL, LDLDIRECT in the last 72 hours. Anemia Panel: No results for input(s): VITAMINB12, FOLATE, FERRITIN, TIBC, IRON, RETICCTPCT in the last 72 hours. Urine analysis:    Component Value Date/Time   COLORURINE AMBER (A) 03/03/2021 1249   APPEARANCEUR HAZY (A) 03/03/2021 1249   LABSPEC 1.024 03/03/2021 1249   PHURINE 5.0 03/03/2021 1249   GLUCOSEU NEGATIVE  03/03/2021 1249   HGBUR NEGATIVE 03/03/2021 1249   HGBUR moderate 10/17/2007 1442   BILIRUBINUR NEGATIVE 03/03/2021 1249   KETONESUR 5 (A) 03/03/2021 1249   PROTEINUR 100 (A) 03/03/2021 1249   UROBILINOGEN 1.0 07/17/2013 1048   NITRITE NEGATIVE 03/03/2021 1249   LEUKOCYTESUR NEGATIVE 03/03/2021 1249   Sepsis Labs: Invalid input(s): PROCALCITONIN, [place]  Microbiology: Recent Results (from the past 240 hour(s))  Resp Panel by RT-PCR (Flu A&B, Covid) Nasopharyngeal Swab     Status: None   Collection Time: 03/03/21 10:00 AM   Specimen: Nasopharyngeal Swab; Nasopharyngeal(NP) swabs in vial transport medium  Result Value Ref Range Status   SARS Coronavirus 2 by RT PCR NEGATIVE NEGATIVE Final    Comment: (NOTE) SARS-CoV-2 target nucleic acids are NOT DETECTED.  The SARS-CoV-2 RNA is generally detectable in upper respiratory specimens during the acute phase of infection. The lowest concentration of SARS-CoV-2 viral copies this assay can detect is 138 copies/mL. A negative result does not preclude SARS-Cov-2 infection and should not be used as the sole basis for treatment or other patient management decisions. A negative result may occur with  improper specimen collection/handling, submission of specimen other than nasopharyngeal swab, presence of viral mutation(s) within the areas targeted by this assay, and inadequate number of viral copies(<138 copies/mL). A negative result must be combined with clinical observations, patient history, and epidemiological information. The expected result is Negative.  Fact Sheet for Patients:  EntrepreneurPulse.com.au  Fact Sheet for Healthcare Providers:  IncredibleEmployment.be  This test is no t yet approved or cleared by the [place] FDA and  has been authorized for detection and/or diagnosis of SARS-CoV-2 by FDA under an Emergency Use Authorization (EUA). This EUA will remain  in effect (meaning  this test can be used) for the duration of the COVID-19 declaration under Section 564(b)(1) of the Act, 21 U.S.C.section 360bbb-3(b)(1), unless the authorization is terminated  or revoked sooner.       Influenza A by PCR NEGATIVE NEGATIVE Final   Influenza B by PCR NEGATIVE NEGATIVE Final    Comment: (NOTE) The Xpert Xpress SARS-CoV-2/FLU/RSV plus assay is intended as an aid in the diagnosis of influenza from Nasopharyngeal swab specimens and should not be used as a sole basis for treatment. Nasal washings and aspirates are unacceptable for Xpert Xpress SARS-CoV-2/FLU/RSV testing.  Fact Sheet for Patients: EntrepreneurPulse.com.au  Fact Sheet for Healthcare Providers:  IncredibleEmployment.be  This test is not yet approved or cleared by the [place] and has been authorized for detection and/or diagnosis of SARS-CoV-2 by FDA under an Emergency Use Authorization (EUA). This EUA will remain in effect (meaning this test can be used) for the duration of the COVID-19 declaration under Section 564(b)(1) of the Act, 21 U.S.C. section 360bbb-3(b)(1), unless the authorization is terminated or revoked.  Performed at [facility], [place] [address], [place], [place] [address]   Aerobic/Anaerobic Culture w Gram Stain (surgical/deep wound)     Status: None (Preliminary result)   Collection Time: 03/04/21  4:49 PM   Specimen: Wound; Abscess  Result Value Ref Range Status   Specimen Description   Final    WOUND Performed at [place] [address], [place], [place] [address]    Special Requests   Final    POST CT DEEP SURGICAL Performed at [facility], [place] [address] [place], [place] [address]    Gram Stain   Final    ABUNDANT WBC PRESENT,BOTH PMN AND MONONUCLEAR FEW GRAM POSITIVE COCCI IN PAIRS IN CHAINS    Culture   Final    TOO YOUNG TO READ Performed at [place], [place] [address], [place], [place] [address]    Report Status PENDING  Incomplete    Radiology Studies: CT IMAGE GUIDED DRAINAGE BY PERCUTANEOUS CATHETER  Result Date: 03/04/2021 INDICATION: Diverticular abscess. Please perform CT-guided percutaneous drainage catheter placement for infection source control purposes. EXAM: CT IMAGE GUIDED DRAINAGE BY PERCUTANEOUS CATHETER COMPARISON:  CT abdomen pelvis-03/03/2021; 01/24/2021 MEDICATIONS: The patient is currently admitted to the hospital and receiving intravenous antibiotics. The antibiotics were administered within an appropriate time frame prior to the initiation of the procedure. ANESTHESIA/SEDATION: Moderate (conscious) sedation was employed during this procedure as administered by the Interventional Radiology RN. A total of Versed 2 mg and Fentanyl 100 mcg was administered intravenously. Moderate Sedation Time: 14 minutes. The patient's level of consciousness and vital signs were monitored continuously by radiology nursing throughout the procedure under my direct supervision. CONTRAST:  None COMPLICATIONS: None immediate. PROCEDURE: RADIATION DOSE REDUCTION: This exam was performed according to the departmental dose-optimization program which includes automated exposure control, adjustment of the mA and/or kV according to patient size and/or use of iterative reconstruction technique. Informed written consent was obtained from the patient after a discussion of the risks, benefits and alternatives to treatment. The patient was placed supine on the CT gantry and a pre procedural CT was performed re-demonstrating the known abscess/fluid collection within the left lower abdomen/pelvis, adjacent to the posterolateral aspect of the proximal sigmoid colon with dominant collection measuring approximately 4.7 x 4.3 cm (image 20, series 4). The procedure was planned. A timeout was performed prior to the initiation of the procedure. The skin overlying the anterolateral  aspect of the lower abdomen/pelvis was prepped and draped in the usual sterile fashion. The overlying soft tissues were anesthetized with 1% lidocaine with epinephrine. Appropriate trajectory was planned with the use of a 22 gauge spinal needle. An 18 gauge trocar needle was advanced into the abscess/fluid collection and a short Amplatz super stiff wire was coiled within the collection. Appropriate positioning was confirmed with a limited CT scan. The tract was serially dilated allowing placement of a 12 [place] all-purpose drainage catheter. Appropriate positioning was confirmed with a limited postprocedural CT scan. Approximally 20 ml of bloody, foul-smelling fluid was aspirated. The tube was connected to a  drainage bag and sutured in place. A dressing was placed. The patient tolerated the procedure well without immediate post procedural complication. IMPRESSION: Successful CT guided placement of a 45 [place] all purpose drain catheter into the left lower abdominal/pelvic diverticular abscess with aspiration of 20 mL of purulent fluid. Samples were sent to the laboratory as requested by the ordering clinical team. Electronically Signed   By: Sandi Mariscal M.D.   On: 03/04/2021 17:18      [place] Chisholm T. [place]  If 7PM-7AM, please contact night-coverage [email or web address] 03/05/2021, 3:23 PM

## 2021-03-05 NOTE — Evaluation (Signed)
Physical Therapy Evaluation ?Patient Details ?Name: Raymond [name] ?MRN: [id number] ?DOB: 1960-01-17 ?Today's Date: 03/05/2021 ? ?History of Present Illness ? 61 yo male presents with progressive LLQ pain for about 2 weeks with associated nausea, vomiting and fever, and found to have sigmoid diverticulitis with abscess; s/p IR drain 3/2. 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  ?Clinical Impression ? Pt admitted with above diagnosis. Pt from home with spouse, using SPC occasionally at home, works as Barrister's clerk at [employer]. Pt currently limited by pain, ambulates 80 ft with RW and min guard for safety. Pt with heavy grip on RW, possibly due to abdominal pain with ambulation, no overt LOB. Pt tolerates remaining up in recliner once returned to room, requesting pain medication whenever it is time- notified RN. Pt currently with functional limitations due to the deficits listed below (see PT Problem List). Pt will benefit from skilled PT to increase their independence and safety with mobility to allow discharge to the venue listed below.   ?   ?   ? ?Recommendations for follow up therapy are one component of a multi-disciplinary discharge planning process, led by the attending physician.  Recommendations may be updated based on patient status, additional functional criteria and insurance authorization. ? ?Follow Up Recommendations Home health PT ? ?  ?Assistance Recommended at Discharge Intermittent Supervision/Assistance  ?Patient can return home with the following ? A little help with walking and/or transfers;A little help with bathing/dressing/bathroom;Assistance with cooking/housework;Help with stairs or ramp for entrance ? ?  ?Equipment Recommendations Rolling walker (2 wheels)  ?Recommendations for Other Services ?    ?  ?Functional Status Assessment Patient has had a recent decline in their functional status and demonstrates the  ability to make significant improvements in function in a reasonable and predictable amount of time.  ? ?  ?Precautions / Restrictions Precautions ?Precautions: Fall ?Precaution Comments: L drain ?Restrictions ?Weight Bearing Restrictions: No  ? ?  ? ?Mobility ? Bed Mobility ? General bed mobility comments: in recliner upon arrival ?  ? ?Transfers ?Overall transfer level: Needs assistance ?Equipment used: Rolling walker (2 wheels) ?Transfers: Sit to/from Stand ?Sit to Stand: Min guard ? General transfer comment: rocking momentum with BUE assisting to power up to stand, increased effort ?  ? ?Ambulation/Gait ?Ambulation/Gait assistance: Min guard ?Gait Distance (Feet): 80 Feet ?Assistive device: Rolling walker (2 wheels) ?Gait Pattern/deviations: Step-through pattern, Decreased stride length ?Gait velocity: decreased ? General Gait Details: step through pattern, trunk slightly flexed with heavy grip on RW, reports increased abdominal pain with ambulation limiting distance ? ?Stairs ?  ?  ?  ?  ?  ? ?Wheelchair Mobility ?  ? ?Modified Rankin (Stroke Patients Only) ?  ? ?  ? ?Balance Overall balance assessment: Mild deficits observed, not formally tested ?    ? ? ? ?Pertinent Vitals/Pain Pain Assessment ?Pain Assessment: 0-10 ?Pain Score: 8  ?Pain Location: abdomen ?Pain Descriptors / Indicators: Grimacing, Guarding, Sore, Tender ?Pain Intervention(s): Limited activity within patient's tolerance, Monitored during session, Repositioned, Patient requesting pain meds-RN notified (pt knows it isn't time, but wants PT to notify RN he wants pain medication as soon as able)  ? ? ?Home Living Family/patient expects to be discharged to:: Private residence ?Living Arrangements: Spouse/significant other ?Available Help at Discharge: Family ?Type of Home: Apartment ?Home Access: Stairs to enter ?Entrance Stairs-Rails: Right;Left (but says they aren't fixed) ?Entrance Stairs-Number of Steps: 8 ?  ?  Home Layout: One level ?Home  Equipment: Kasandra Knudsen - single point;Shower seat ?Additional Comments: works full time as a Environmental manager at [employer]  ?  ?Prior Function Prior Level of Function : Independent/Modified Independent ? Mobility Comments: Pt reports using SPC PRN around the home, ambulates at work, gets on/off forklifts as needed ?ADLs Comments: modified independent ?  ? ? ?Hand Dominance  ? Dominant Hand: Left ? ?  ?Extremity/Trunk Assessment  ? Upper Extremity Assessment ?Upper Extremity Assessment: Defer to OT evaluation ?RUE Deficits / Details: WFL ROM and 5/5 strength ?RUE Sensation: WNL ?RUE Coordination: WNL ?LUE Deficits / Details: WFL ROM except left wrist locked in approx 30 degrees flexion and impaired supination - due to metal plate/pinning ?LUE Sensation: WNL ?LUE Coordination: WNL ?  ? ?Lower Extremity Assessment ?Lower Extremity Assessment: Overall WFL for tasks assessed (AROM WNL, strength 4/5 throughout, denies numbness/tingling) ?  ? ?Cervical / Trunk Assessment ?Cervical / Trunk Assessment: Normal  ?Communication  ? Communication: No difficulties  ?Cognition Arousal/Alertness: Awake/alert ?Behavior During Therapy: [facility] for tasks assessed/performed ?Overall Cognitive Status: Within Functional Limits for tasks assessed ?   ?  ? ?  ?General Comments   ? ?  ?Exercises    ? ?Assessment/Plan  ?  ?PT Assessment Patient needs continued PT services  ?PT Problem List Decreased activity tolerance;Decreased balance;Pain ? ?   ?  ?PT Treatment Interventions DME instruction;Gait training;Stair training;Functional mobility training;Therapeutic activities;Therapeutic exercise;Balance training;Patient/family education   ? ?PT Goals (Current goals can be found in the Care Plan section)  ?Acute Rehab PT Goals ?Patient Stated Goal: regain independence ?PT Goal Formulation: With patient ?Time For Goal Achievement: 03/19/21 ?Potential to Achieve Goals: Good ? ?  ?Frequency Min 3X/week ?  ? ? ?Co-evaluation   ?  ?  ?  ?  ? ? ?  ?AM-PAC PT "6  Clicks" Mobility  ?Outcome Measure Help needed turning from your back to your side while in a flat bed without using bedrails?: A Little ?Help needed moving from lying on your back to sitting on the side of a flat bed without using bedrails?: A Little ?Help needed moving to and from a bed to a chair (including a wheelchair)?: A Little ?Help needed standing up from a chair using your arms (e.g., wheelchair or bedside chair)?: A Little ?Help needed to walk in hospital room?: A Little ?Help needed climbing 3-5 steps with a railing? : A Little ?6 Click Score: 18 ? ?  ?End of Session   ?Activity Tolerance: Patient tolerated treatment well;Patient limited by pain ?Patient left: in chair;with call bell/phone within reach ?Nurse Communication: Mobility status;Patient requests pain meds ?PT Visit Diagnosis: Other abnormalities of gait and mobility (R26.89);Pain ?Pain - part of body:  (abdomen) ?  ? ?Time: 3734-2876 ?PT Time Calculation (min) (ACUTE ONLY): 17 min ? ? ?Charges:   PT Evaluation ?$PT Eval Low Complexity: 1 Low ?  ?  ?   ? ? ? ?Talbot Grumbling PT, DPT ?03/05/21, 4:05 PM ? ? ?

## 2021-03-05 NOTE — Evaluation (Signed)
Occupational Therapy Evaluation ?Patient Details ?Name: Raymond [name] ?MRN: [id number] ?DOB: Mar 22, 1960 ?Today's Date: 03/05/2021 ? ? ?History of Present Illness 61 year old M with PMH of stage IV lung cancer with mets to brain s/p chemo and whole brain radiation, diverticulitis, HTN and depression presenting with progressive LLQ pain for about 2 weeks with associated nausea, vomiting and fever, and found to have sigmoid diverticulitis with abscess.  ? ?Clinical Impression ?  ?Mr. Raymond [name] is a 61 year old man who presents with above medical history. 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 On evaluation he is min guard for mobility and  ADLs due to pain in left side. He used a walker for comfort. He pulled his legs up on the bed to perform lower body dressing. Patient limited to just taking steps to recliner due to pain. Patient will benefit from skilled OT services while in hospital to improve deficits and learn compensatory strategies as needed in order to return to PLOF.   ?   ? ?Recommendations for follow up therapy are one component of a multi-disciplinary discharge planning process, led by the attending physician.  Recommendations may be updated based on patient status, additional functional criteria and insurance authorization.  ? ?Follow Up Recommendations ? Home health OT  ?  ?Assistance Recommended at Discharge Intermittent Supervision/Assistance  ?Patient can return home with the following Assistance with cooking/housework;A little help with walking and/or transfers;A little help with bathing/dressing/bathroom ? ?  ?Functional Status Assessment ? Patient has had a recent decline in their functional status and demonstrates the ability to make significant improvements in function in a reasonable and predictable amount of time.  ?Equipment Recommendations ? None recommended by OT  ?  ?Recommendations  for Other Services   ? ? ?  ?Precautions / Restrictions Precautions ?Precaution Comments: left side drain ?Restrictions ?Weight Bearing Restrictions: No  ? ?  ? ?Mobility Bed Mobility ?Overal bed mobility: Needs Assistance ?Bed Mobility: Supine to Sit ?  ?  ?Supine to sit: Supervision ?  ?  ?General bed mobility comments: increased time due to pain ?  ? ?Transfers ?Overall transfer level: Needs assistance ?Equipment used: Rolling walker (2 wheels) ?Transfers: Sit to/from Stand, Bed to chair/wheelchair/BSC ?Sit to Stand: Min guard ?  ?  ?Step pivot transfers: Min guard ?  ?  ?  ?  ? ?  ?Balance Overall balance assessment: Mild deficits observed, not formally tested ?  ?  ?  ?  ?  ?  ?  ?  ?  ?  ?  ?  ?  ?  ?  ?  ?  ?  ?   ? ?ADL either performed or assessed with clinical judgement  ? ?ADL Overall ADL's : Needs assistance/impaired ?Eating/Feeding: Independent ?  ?Grooming: Set up;Sitting ?  ?Upper Body Bathing: Set up;Sitting ?  ?Lower Body Bathing: Sitting/lateral leans;Set up ?Lower Body Bathing Details (indicate cue type and reason): bulling legs up on to bed ?Upper Body Dressing : Set up;Sitting ?  ?Lower Body Dressing: Min guard;Sitting/lateral leans ?Lower Body Dressing Details (indicate cue type and reason): pulled legs up on to bed to don socks ?Toilet Transfer: Min guard;BSC/3in1;Rolling walker (2 wheels) ?  ?Toileting- Water quality scientist and Hygiene: Min guard;Sit to/from stand ?  ?  ?  ?Functional mobility during ADLs: Min guard ?General ADL Comments: Min guard for mobility. Patient limited by pain. Used walker for  comfort - difficulty with gripping on left due to wrist flexion  ? ? ? ?Vision Patient Visual Report: No change from baseline ?   ?   ?Perception   ?  ?Praxis   ?  ? ?Pertinent Vitals/Pain Pain Assessment ?Pain Assessment: Faces ?Pain Score: 8  ?Pain Location: left abdomen ?Pain Descriptors / Indicators: Grimacing, Guarding ?Pain Intervention(s): Premedicated before session  ? ? ? ?Hand  Dominance Left ?  ?Extremity/Trunk Assessment Upper Extremity Assessment ?Upper Extremity Assessment: RUE deficits/detail;LUE deficits/detail ?RUE Deficits / Details: WFL ROM and 5/5 strength ?RUE Sensation: WNL ?RUE Coordination: WNL ?LUE Deficits / Details: WFL ROM except left wrist locked in approx 30 degrees flexion and impaired supination - due to metal plate/pinning ?LUE Sensation: WNL ?LUE Coordination: WNL ?  ?Lower Extremity Assessment ?Lower Extremity Assessment: Defer to PT evaluation ?  ?Cervical / Trunk Assessment ?Cervical / Trunk Assessment: Normal ?  ?Communication Communication ?Communication: No difficulties ?  ?Cognition Arousal/Alertness: Awake/alert ?Behavior During Therapy: [facility] for tasks assessed/performed ?Overall Cognitive Status: Within Functional Limits for tasks assessed ?  ?  ?  ?  ?  ?  ?  ?  ?  ?  ?  ?  ?  ?  ?  ?  ?  ?  ?  ?General Comments    ? ?  ?Exercises   ?  ?Shoulder Instructions    ? ? ?Home Living Family/patient expects to be discharged to:: Private residence ?Living Arrangements: Spouse/significant other ?Available Help at Discharge: Family ?Type of Home: Apartment ?Home Access: Stairs to enter ?Entrance Stairs-Number of Steps: 8 ?Entrance Stairs-Rails: Right;Left (but says they aren't fixed) ?Home Layout: One level ?  ?  ?Bathroom Shower/Tub: Tub/shower unit ?  ?Bathroom Toilet: Standard ?Bathroom Accessibility: Yes ?  ?Home Equipment: Kasandra Knudsen - single point;Shower seat ?  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 ?  ? ?  ?Prior Functioning/Environment Prior Level of Function : Independent/Modified Independent ?  ?  ?  ?  ?  ?  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  ? ?  ?  ?OT Problem List: Decreased activity tolerance;Pain ?  ?   ?OT Treatment/Interventions: Self-care/ADL training;DME and/or AE  instruction;Therapeutic activities;Patient/family education  ?  ?OT Goals(Current goals can be found in the care plan section) Acute Rehab OT Goals ?Patient Stated Goal: to get better ?OT Goal Formulation: With patient ?Time For Goal Achievement: 03/19/21 ?Potential to Achieve Goals: Good  ?OT Frequency: Min 2X/week ?  ? ?Co-evaluation   ?  ?  ?  ?  ? ?  ?AM-PAC OT "6 Clicks" Daily Activity     ?Outcome Measure Help from another person eating meals?: None ?Help from another person taking care of personal grooming?: A Little ?Help from another person toileting, which includes using toliet, bedpan, or urinal?: A Little ?Help from another person bathing (including washing, rinsing, drying)?: A Little ?Help from another person to put on and taking off regular upper body clothing?: A Little ?Help from another person to put on and taking off regular lower body clothing?: A Little ?6 Click Score: 19 ?  ?End of Session Equipment Utilized During Treatment: Rolling walker (2 wheels) ?Nurse Communication: Mobility status (request chaplain) ? ?Activity Tolerance: Patient tolerated treatment well ?Patient left: in chair ? ?OT Visit Diagnosis: Pain  ?              ?  Time: 0156-1537 ?OT Time Calculation (min): 16 min ?Charges:  OT General Charges ?$OT Visit: 1 Visit ?OT Evaluation ?$OT Eval Low Complexity: 1 Low ? ?Naveah Brave, OTR/L ?Acute Care Rehab Services  ?Office [phone] ?Pager: [phone]  ? ?Tod Abrahamsen L Jourdain Guay ?03/05/2021, 2:53 PM ?

## 2021-03-05 NOTE — Assessment & Plan Note (Addendum)
Resolved

## 2021-03-05 NOTE — Progress Notes (Signed)
Referring Physician(s): B. Meuth PA  Supervising Physician: Ruthann Cancer  Patient Status:  [facility] - In-pt  Chief Complaint:  LLQ abdominal pain,. Nausea and fever found to have a diverticular abscess s/p LUQ abscess drain to the abdominal/pelvic diverticular abscess with aspiration of 20 ml of purulent fluid on 3.2.23 by Dr. Pascal Lux  Subjective:  Patient sitting up in recliner. Patient reporting flank pain by the drain exit site that radiates toward her umbilicus. Requesting pain medication. Bedside RN made aware.   Allergies: Benadryl [diphenhydramine]  Medications: Prior to Admission medications   Medication Sig Start Date End Date Taking? Authorizing Provider  acetaminophen (TYLENOL) 650 MG CR tablet Take 650 mg by mouth every 8 (eight) hours as needed for pain.   Yes [provider]  amLODipine (NORVASC) 5 MG tablet Take 1 tablet (5 mg total) by mouth daily. 01/27/21 01/27/22 Yes [name], MD  dexamethasone (DECADRON) 4 MG tablet Please take 2 tablets 2 times a day the day before, the day of, and the day after treatment. 02/15/21  Yes [name], [name], PA-C  Ensure Plus (ENSURE PLUS) LIQD Take 237 mLs by mouth 3 (three) times daily between meals.   Yes [provider]  folic acid (FOLVITE) 1 MG tablet Take 1 tablet (1 mg total) by mouth daily. 01/27/21 03/31/21 Yes [name], MD  gabapentin (NEURONTIN) 300 MG capsule Take 1 capsule (300 mg total) by mouth at bedtime. 02/15/21 03/17/21 Yes Pickenpack-Cousar, Carlena Sax, NP  guaiFENesin (MUCINEX) 600 MG 12 hr tablet Take 600 mg by mouth 2 (two) times daily.   Yes [provider]  Homeopathic Products (ZICAM ALLERGY RELIEF NA) Place 2 sprays into the nose daily.   Yes [provider]  memantine (NAMENDA) 10 MG tablet Take 1 tablet (10 mg total) by mouth 2 (two) times daily. 01/26/21  Yes [name], PA-C  mirtazapine (REMERON) 30 MG tablet Take 1 tablet (30 mg  total) by mouth at bedtime. 02/15/21  Yes Pickenpack-Cousar, Carlena Sax, NP  Misc Natural Products (OSTEO BI-FLEX ADV JOINT SHIELD) TABS Take 1 tablet by mouth daily.   Yes [provider]  ondansetron (ZOFRAN) 8 MG tablet Take 1 tablet (8 mg total) by mouth every 8 (eight) hours as needed for nausea or vomiting. 02/25/21  Yes Pickenpack-Cousar, Carlena Sax, NP  oxyCODONE (OXY IR/ROXICODONE) 5 MG immediate release tablet Take 1 tablet (5 mg total) by mouth every 6 (six) hours as needed for severe pain. 02/25/21  Yes Pickenpack-Cousar, Carlena Sax, NP  prochlorperazine (COMPAZINE) 10 MG tablet Take 1 tablet (10 mg total) by mouth every 6 (six) hours as needed for nausea or vomiting. 02/25/21  Yes Pickenpack-Cousar, Carlena Sax, NP  simethicone (MYLICON) 010 MG chewable tablet Chew 125 mg by mouth every 6 (six) hours as needed for flatulence.   Yes [provider]  tamsulosin (FLOMAX) 0.4 MG CAPS capsule Take 1 capsule (0.4 mg total) by mouth daily after supper. 01/27/21  Yes [name], MD  Wheat Dextrin (BENEFIBER DRINK MIX) PACK Take 1 Package by mouth daily at 6 (six) AM.   Yes [provider]  memantine (NAMENDA) 5 MG tablet Begin this prescription the first day of brain radiation. Week 1: Take 1 tablet by mouth every morning. Week 2: Take 1 tablet every morning and evening. Week 3: Take 2 tablets every morning, and 1 tablet in the evening. Week 4: Take 2 tablets in the morning and evening. Fill  subsequent prescription each month. Patient not taking: Reported on 03/03/2021 01/26/21   [name], PA-C     Vital Signs: BP (!) 147/72 (BP Location: Right Arm, Patient Position: Supine)    Pulse 90    Temp 99.1 F (37.3 C) (Oral)    Resp 18    Ht 5\' 11"  (1.803 m)    Wt 254 lb (115.2 kg)    SpO2 98%    BMI 35.43 kg/m   Physical Exam Vitals and nursing note reviewed.  Constitutional:      Appearance: He is well-developed.  HENT:     Head: Normocephalic.  Pulmonary:      Effort: Pulmonary effort is normal.  Abdominal:     Comments: Positive LUQ  drain to gravity bag. Site is unremarkable with no erythema, edema, tenderness, bleeding or drainage noted at exit site. Suture and stat lock in place. Dressing is clean dry and intact. 5 ml of  serous colored fluid noted in gravity bag. Drain is able to be flushed easily.    Musculoskeletal:        General: Normal range of motion.     Cervical back: Normal range of motion.  Skin:    General: Skin is dry.  Neurological:     Mental Status: He is alert and oriented to person, place, and time.    Imaging: CT Abdomen Pelvis W Contrast  Result Date: 03/03/2021 CLINICAL DATA:  Nonlocalized abdominal pain, history of lung cancer EXAM: CT ABDOMEN AND PELVIS WITH CONTRAST TECHNIQUE: Multidetector CT imaging of the abdomen and pelvis was performed using the standard protocol following bolus administration of intravenous contrast. RADIATION DOSE REDUCTION: This exam was performed according to the departmental dose-optimization program which includes automated exposure control, adjustment of the mA and/or kV according to patient size and/or use of iterative reconstruction technique. CONTRAST:  132mL OMNIPAQUE IOHEXOL 300 MG/ML  SOLN COMPARISON:  CT abdomen and pelvis dated January 24, 2021; abdominal ultrasound dated January 24, 2021. FINDINGS: Lower chest: Numerous small solid pulmonary nodules, unchanged compared to prior exam. Hepatobiliary: New small low-attenuation liver lesions. Lesion of the right hepatic dome measuring 9 mm on series 2, image 13. Lesion of the left hepatic dome measuring 6 mm on image 13. Cholelithiasis. Unchanged mild gallbladder wall thickening, finding previously evaluated with ultrasound and likely due to adenomyomatosis. No biliary ductal dilation. Pancreas: Unremarkable. No pancreatic ductal dilatation or surrounding inflammatory changes. Spleen: Normal in size without focal abnormality.  Adrenals/Urinary Tract: Unchanged thickening of the left adrenal gland. Right adrenal gland is unremarkable. No hydronephrosis. Punctate nonobstructing bilateral renal stones. Bladder is unremarkable. Stomach/Bowel: Wall thickening of the sigmoid colon with surrounding inflammatory change. Associated fluid collection with air-fluid level seen adjacent to the wall of the sigmoid colon measuring 4.8 x 3.1 cm. Vascular/Lymphatic: Aortic atherosclerosis. No enlarged abdominal or pelvic lymph nodes. Reproductive: Mild prostatomegaly. Other: No abdominal wall hernia or abnormality. No abdominopelvic ascites. Musculoskeletal: Moderate degenerative changes of the lumbar spine. No aggressive osseous lesions. IMPRESSION: 1. New fluid collection is seen in the left lower quadrant adjacent to the wall the sigmoid colon which measures 4.8 x 3.1 cm, likely due to abscess. 2. Persistent wall thickening of the sigmoid colon with surrounding inflammatory change, finding has been present on multiple prior exams, differential considerations include acute on chronic diverticulitis or malignancy, correlate with recent colonoscopy. 3. Numerous small solid pulmonary nodules of the partially visualized lung bases, unchanged compared to prior exam. 4. New subcentimeter lesions of the liver  dome, concerning for progressive metastatic disease. 5. Aortic Atherosclerosis (ICD10-I70.0). Electronically Signed   By: Yetta Glassman M.D.   On: 03/03/2021 13:58   CT IMAGE GUIDED DRAINAGE BY PERCUTANEOUS CATHETER  Result Date: 03/04/2021 INDICATION: Diverticular abscess. Please perform CT-guided percutaneous drainage catheter placement for infection source control purposes. EXAM: CT IMAGE GUIDED DRAINAGE BY PERCUTANEOUS CATHETER COMPARISON:  CT abdomen pelvis-03/03/2021; 01/24/2021 MEDICATIONS: The patient is currently admitted to the hospital and receiving intravenous antibiotics. The antibiotics were administered within an appropriate time  frame prior to the initiation of the procedure. ANESTHESIA/SEDATION: Moderate (conscious) sedation was employed during this procedure as administered by the Interventional Radiology RN. A total of Versed 2 mg and Fentanyl 100 mcg was administered intravenously. Moderate Sedation Time: 14 minutes. The patient's level of consciousness and vital signs were monitored continuously by radiology nursing throughout the procedure under my direct supervision. CONTRAST:  None COMPLICATIONS: None immediate. PROCEDURE: RADIATION DOSE REDUCTION: This exam was performed according to the departmental dose-optimization program which includes automated exposure control, adjustment of the mA and/or kV according to patient size and/or use of iterative reconstruction technique. Informed written consent was obtained from the patient after a discussion of the risks, benefits and alternatives to treatment. The patient was placed supine on the CT gantry and a pre procedural CT was performed re-demonstrating the known abscess/fluid collection within the left lower abdomen/pelvis, adjacent to the posterolateral aspect of the proximal sigmoid colon with dominant collection measuring approximately 4.7 x 4.3 cm (image 20, series 4). The procedure was planned. A timeout was performed prior to the initiation of the procedure. The skin overlying the anterolateral aspect of the lower abdomen/pelvis was prepped and draped in the usual sterile fashion. The overlying soft tissues were anesthetized with 1% lidocaine with epinephrine. Appropriate trajectory was planned with the use of a 22 gauge spinal needle. An 18 gauge trocar needle was advanced into the abscess/fluid collection and a short Amplatz super stiff wire was coiled within the collection. Appropriate positioning was confirmed with a limited CT scan. The tract was serially dilated allowing placement of a 12 [place] all-purpose drainage catheter. Appropriate positioning was confirmed with a  limited postprocedural CT scan. Approximally 20 ml of bloody, foul-smelling fluid was aspirated. The tube was connected to a drainage bag and sutured in place. A dressing was placed. The patient tolerated the procedure well without immediate post procedural complication. IMPRESSION: Successful CT guided placement of a 13 French all purpose drain catheter into the left lower abdominal/pelvic diverticular abscess with aspiration of 20 mL of purulent fluid. Samples were sent to the laboratory as requested by the ordering clinical team. Electronically Signed   By: Sandi Mariscal M.D.   On: 03/04/2021 17:18    Labs:  CBC: Recent Labs    03/01/21 0910 03/03/21 0958 03/04/21 0523 03/05/21 0515  WBC 1.4* 7.4 12.7* 15.6*  HGB 11.5* 13.5 11.5* 10.5*  HCT 35.6* 41.1 36.9* 32.5*  PLT 121* 160 152 144*    COAGS: Recent Labs    01/24/21 1200 01/25/21 0344 03/04/21 0523  INR 1.1 1.2 1.1  APTT 28  --   --     BMP: Recent Labs    03/01/21 0910 03/03/21 0958 03/04/21 0523 03/05/21 0515  NA 139 137 138 136  K 4.0 3.8 4.5 3.9  CL 105 103 102 101  CO2 30 26 30 30   GLUCOSE 99 116* 99 101*  BUN 11 17 16 9   CALCIUM 9.4 9.5 8.8* 8.2*  CREATININE 0.80  1.33* 0.98 0.78  GFRNONAA >60 >60 >60 >60    LIVER FUNCTION TESTS: Recent Labs    02/23/21 1020 03/01/21 0910 03/03/21 0958 03/04/21 0523 03/05/21 0515  BILITOT 0.4 0.8 0.4 0.2*  --   AST 18 22 28 22   --   ALT 12 16 19 16   --   ALKPHOS 103 96 94 88  --   PROT 8.0 7.2 7.5 6.6  --   ALBUMIN 4.0 3.7 3.5 3.0* 2.8*    Assessment and Plan:  61 y.o. male inpatient. History of arthritis, GERD, hiatal hernia, cholelithiasis, nephrolithiases, HTN, metastatic left lung cancer. Currently in chenotherapy and whole brain radiation. Patient presented to the ED at [facility] on 3.1.23 with LLQ abdominal pain, nausea and vomiting.  IR placed a Left lower abscess drain to the abdominal/pelvic diverticular abscess with aspiration of 20 ml of purulent fluid.    Gram stain shows abundant WBC both PMN and Mononuclear. Cultures pending. WBC is 15.6, CRP 17.2. Last recorded temperature is 99.3 Drain Location: LUQ Size: Fr size: 12 Fr Date of placement: 3.2.23  Currently to: Drain collection device: gravity 24 hour output:  Output by Drain (mL) 03/03/21 0701 - 03/03/21 1900 03/03/21 1901 - 03/04/21 0700 03/04/21 0701 - 03/04/21 1900 03/04/21 1901 - 03/05/21 0700 03/05/21 0701 - 03/05/21 1454  Closed System Drain 1 LUQ  12 Fr.    60 20  5 ml of serous output noted to be in gravity bag.  Interval imaging/drain manipulation:  None since drain placement  Current examination: Flushes/aspirates easily.  Insertion site unremarkable. Suture and stat lock in place. Dressed appropriately.   Plan: Continue TID flushes with 5 cc NS. Record output Q shift. Dressing changes QD or PRN if soiled.  Call IR APP or on call IR MD if difficulty flushing or sudden change in drain output.  Repeat imaging/possible drain injection once output < 10 mL/QD (excluding flush material.)  Discharge planning: Please contact IR APP or on call IR MD prior to patient d/c to ensure appropriate follow up plans are in place. Typically patient will follow up with IR clinic 10-14 days post d/c for repeat imaging/possible drain injection. IR scheduler will contact patient with date/time of appointment. Patient will need to flush drain QD with 5 cc NS, record output QD, dressing changes every 2-3 days or earlier if soiled.   IR will continue to follow - please call with questions or concerns.    Electronically Signed: Jacqualine Mau, NP 03/05/2021, 2:51 PM   I spent a total of 15 Minutes at the patient's bedside AND on the patient's hospital floor or unit, greater than 50% of which was counseling/coordinating care for LUQ diverticular abscess drain.

## 2021-03-06 DIAGNOSIS — D72825 Bandemia: Secondary | ICD-10-CM

## 2021-03-06 LAB — RENAL FUNCTION PANEL
Albumin: 3 g/dL — ABNORMAL LOW (ref 3.5–5.0)
Anion gap: 8 (ref 5–15)
BUN: 6 mg/dL (ref 6–20)
CO2: 29 mmol/L (ref 22–32)
Calcium: 8.6 mg/dL — ABNORMAL LOW (ref 8.9–10.3)
Chloride: 100 mmol/L (ref 98–111)
Creatinine, Ser: 0.82 mg/dL (ref 0.61–1.24)
GFR, Estimated: >60 mL/min (ref >60)
Glucose, Bld: 101 mg/dL — ABNORMAL HIGH (ref 70–99)
Phosphorus: 2.9 mg/dL (ref 2.5–4.6)
Potassium: 3.7 mmol/L (ref 3.5–5.1)
Sodium: 137 mmol/L (ref 135–145)

## 2021-03-06 LAB — MAGNESIUM: Magnesium: 2.2 mg/dL (ref 1.7–2.4)

## 2021-03-06 LAB — CBC
HCT: 34.9 % — ABNORMAL LOW (ref 39.0–52.0)
Hemoglobin: 10.9 g/dL — ABNORMAL LOW (ref 13.0–17.0)
MCH: 30.1 pg (ref 26.0–34.0)
MCHC: 31.2 g/dL (ref 30.0–36.0)
MCV: 96.4 fL (ref 80.0–100.0)
Platelets: 167 10*3/uL (ref 150–400)
RBC: 3.62 MIL/uL — ABNORMAL LOW (ref 4.22–5.81)
RDW: 12.5 % (ref 11.5–15.5)
WBC: 15.7 10*3/uL — ABNORMAL HIGH (ref 4.0–10.5)
nRBC: 0.2 % (ref 0.0–0.2)

## 2021-03-06 MED ORDER — HYDROCERIN EX CREA
TOPICAL_CREAM | Freq: Two times a day (BID) | CUTANEOUS | Status: DC
  Filled 2021-03-06: qty 113

## 2021-03-06 MED ORDER — SODIUM CHLORIDE 0.9 % IV SOLN
1.0000 g | INTRAVENOUS | Status: DC
  Administered 2021-03-06 – 2021-03-09 (×4): 1000 mg via INTRAVENOUS
  Filled 2021-03-06 (×4): qty 1

## 2021-03-06 NOTE — Progress Notes (Signed)
PROGRESS NOTE  Raymond RIECKE JOI:[id number] DOB: 04-10-1960   PCP: Pcp, No  Patient is from: Home.  Lives with his wife.  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  Started on IV Zosyn.  General surgery and IR consulted.  Patient had CT-guided drain placement for diverticular abscess on 3/2.  Abscess culture pending.  Antibiotics changed to IV Invanz by general surgery.  Subjective: Seen and examined earlier this morning.  No major events overnight of this morning.  He noted some bleeding from his left nare last night.  Also endorses left flank pain but improved.  He rates the pain 6/10 at rest.  He says pain could go as high as 8 with movement.  No other complaints.  Objective: Vitals:   03/05/21 2108 03/06/21 0529 03/06/21 0847 03/06/21 1326  BP: (!) 153/70 (!) 144/73 (!) 145/73 (!) 153/81  Pulse: 87 91 80 88  Resp: 16 18 16 18   Temp: 99.2 F (37.3 C) 98.4 F (36.9 C) 98.4 F (36.9 C) 98.8 F (37.1 C)  TempSrc: Oral Oral Oral Oral  SpO2: 90% 91% 92% 92%  Weight:      Height:        Examination:  GENERAL: No apparent distress.  Nontoxic. HEENT: MMM.  Vision and hearing grossly intact. Some bruise to nasal septum on the left.  No active bleed NECK: Supple.  No apparent JVD.  RESP:  No IWOB.  Fair aeration bilaterally. CVS:  RRR. Heart sounds normal.  ABD/GI/GU: BS+. Abd soft, NTND.  Drain output appears bloody. MSK/EXT:  Moves extremities. No apparent deformity. No edema.  SKIN: no apparent skin lesion or wound NEURO: Awake and alert. Oriented appropriately.  No  apparent focal neuro deficit. PSYCH: Calm. Normal affect.   Procedures:  3/2-CT-guided drain placement for diverticular abscess by IR  Microbiology summarized: COVID-19 and influenza PCR nonreactive. 3/2-abscess culture with GNR, GBS,  Assessment and Plan: * Abscess of sigmoid colon due to diverticulitis S/p CT-guided drain placement on 3/2.  Abscess culture pending. -IV Zosyn>> IV Invanz per general surgery -On clear liquid diet -Pain control by general surgery -SCD for VTE prophylaxis given bloody output from the drain and epistaxis  Epistaxis Likely from left nares.  No active bleed on exam today.  Lovenox discontinued -Wean off oxygen -Continue saline nasal spray, Afrin nasal spray -SCD for VTE prophylaxis  Normocytic anemia Recent Labs    01/25/21 0344 01/26/21 0405 01/27/21 0357 02/15/21 0927 02/23/21 1020 03/01/21 0910 03/03/21 0958 03/04/21 0523 03/05/21 0515 03/06/21 0544  HGB 9.3* 10.4* 9.4* 11.0* 12.0* 11.5* 13.5 11.5* 10.5* 10.9*  Hgb dropped about 2 g.  Now stable. -Continue SCD for VTE prophylaxis given bloody output from drain, and epistaxis -Continue monitoring  Hypertension BP slightly elevated but improved. -Increased amlodipine to 10 mg daily -Stop IV fluid -Hydralazine as needed  Bandemia Likely from infection.  Stable. -Antibiotic as above  Cognitive impairment Seems like he was recently started on memantine.  Not sure if this was related to his whole brain radiation. -Continue home memantine.  Anxiety and depression -Continue Zoloft  and Remeron.  RLS (restless legs syndrome) Continue home gabapentin  Adenocarcinoma of left lung, stage 4 (HCC) Status postchemotherapy and whole brain radiation. -Outpatient follow-up with Dr. Julien Nordmann.  TOBACCO ABUSE Encourage tobacco cessation. Nicotine patch as needed  Obesity, unspecified Body mass index is 35.43 kg/m. -Encourage lifestyle change to lose weight  Increased nutrient  needs Body mass index is 35.43 kg/m. Nutrition Problem: Increased nutrient needs Etiology: cancer and cancer related treatments Signs/Symptoms: estimated needs Interventions: Ensure Enlive (each supplement provides 350kcal and 20 grams of protein), MVI   DVT prophylaxis:  Place and maintain sequential compression device Start: 03/05/21 1518  Code Status: Full code Family Communication: Updated patient's wife over the phone on 3/3. Level of care: Med-Surg Status is: Inpatient Remains inpatient appropriate because: Sigmoid diverticulitis with abscess requiring IV antibiotics     Final disposition: Likely home once cleared   Consultants:  General surgery Interventional radiology   Sch Meds:  Scheduled Meds:  amLODipine  10 mg Oral Daily   docusate sodium  100 mg Oral BID   feeding supplement  237 mL Oral BID BM   folic acid  1 mg Oral Daily   gabapentin  300 mg Oral QHS   loratadine  10 mg Oral Daily   memantine  10 mg Oral BID   mirtazapine  30 mg Oral QHS   multivitamin with minerals  1 tablet Oral Daily   sodium chloride flush  5 mL Intracatheter Q8H   tamsulosin  0.4 mg Oral QPC supper   Continuous Infusions:  ertapenem 1,000 mg (03/06/21 0932)   lactated ringers Stopped (03/06/21 1229)   PRN Meds:.acetaminophen **OR** acetaminophen, hydrALAZINE, HYDROmorphone (DILAUDID) injection, ondansetron **OR** ondansetron (ZOFRAN) IV, oxyCODONE, oxymetazoline, prochlorperazine, sodium chloride  Antimicrobials: Anti-infectives (From admission, onward)    Start     Dose/Rate Route Frequency Ordered Stop   03/06/21 1000  ertapenem (INVANZ) 1,000 mg in sodium chloride 0.9 % 100 mL IVPB        1 g 200 mL/hr over 30 Minutes Intravenous Every 24 hours 03/06/21 0825     03/03/21 2200  piperacillin-tazobactam (ZOSYN) IVPB 3.375 g  Status:  Discontinued       See Hyperspace for full Linked Orders Report.   3.375 g 12.5 mL/hr over 240 Minutes Intravenous Every 8 hours 03/03/21  1600 03/06/21 0825   03/03/21 1600  piperacillin-tazobactam (ZOSYN) IVPB 3.375 g       See Hyperspace for full Linked Orders Report.   3.375 g 100 mL/hr over 30 Minutes Intravenous  Once 03/03/21 1600 03/04/21 0700        I have personally reviewed the following labs and images: CBC: Recent Labs  Lab 03/01/21 0910 03/03/21 0958 03/04/21 0523 03/05/21 0515 03/06/21 0544  WBC 1.4* 7.4 12.7* 15.6* 15.7*  NEUTROABS 0.2* 4.6  --   --   --   HGB 11.5* 13.5 11.5* 10.5* 10.9*  HCT 35.6* 41.1 36.9* 32.5* 34.9*  MCV 93.9 94.5 97.4 96.2 96.4  PLT 121* 160 152 144* 167   BMP &GFR Recent Labs  Lab 03/01/21 0910 03/03/21 0958 03/04/21 0523 03/05/21 0515 03/06/21 0544  NA 139 137 138 136 137  K 4.0 3.8 4.5 3.9 3.7  CL 105 103 102 101 100  CO2 30 26 30 30 29   GLUCOSE 99 116* 99 101* 101*  BUN 11 17 16 9 6   CREATININE 0.80 1.33* 0.98 0.78 0.82  CALCIUM 9.4 9.5 8.8* 8.2* 8.6*  MG  --   --  2.1 2.1 2.2  PHOS  --   --  3.7 3.6 2.9   Estimated Creatinine Clearance: 126.8 mL/min (by C-G formula based on SCr of 0.82 mg/dL). Liver & Pancreas: Recent Labs  Lab 03/01/21 0910 03/03/21 0958 03/04/21 0523 03/05/21 0515 03/06/21 0544  AST 22 28 22   --   --   ALT 16 19 16   --   --   ALKPHOS 96 94 88  --   --   BILITOT 0.8 0.4 0.2*  --   --   PROT 7.2 7.5 6.6  --   --   ALBUMIN 3.7 3.5 3.0* 2.8* 3.0*   Recent Labs  Lab 03/03/21 0958  LIPASE 21   No results for input(s): AMMONIA in the last 168 hours. Diabetic: No results for input(s): HGBA1C in the last 72 hours. No results for input(s): GLUCAP in the last 168 hours. Cardiac Enzymes: No results for input(s): CKTOTAL, CKMB, CKMBINDEX, TROPONINI in the last 168 hours. No results for input(s): PROBNP in the last 8760 hours. Coagulation Profile: Recent Labs  Lab 03/04/21 0523  INR 1.1   Thyroid Function Tests: No results for input(s): TSH, T4TOTAL, FREET4, T3FREE, THYROIDAB in the last 72 hours. Lipid Profile: No results  for input(s): CHOL, HDL, LDLCALC, TRIG, CHOLHDL, LDLDIRECT in the last 72 hours. Anemia Panel: No results for input(s): VITAMINB12, FOLATE, FERRITIN, TIBC, IRON, RETICCTPCT in the last 72 hours. Urine analysis:    Component Value Date/Time   COLORURINE AMBER (A) 03/03/2021 1249   APPEARANCEUR HAZY (A) 03/03/2021 1249   LABSPEC 1.024 03/03/2021 1249   PHURINE 5.0 03/03/2021 1249   GLUCOSEU NEGATIVE 03/03/2021 1249   HGBUR NEGATIVE 03/03/2021 1249   HGBUR moderate 10/17/2007 1442   BILIRUBINUR NEGATIVE 03/03/2021 1249   KETONESUR 5 (A) 03/03/2021 1249   PROTEINUR 100 (A) 03/03/2021 1249   UROBILINOGEN 1.0 07/17/2013 1048   NITRITE NEGATIVE 03/03/2021 1249   LEUKOCYTESUR NEGATIVE 03/03/2021 1249   Sepsis Labs: Invalid input(s): PROCALCITONIN, [place]  Microbiology: Recent Results (from the past 240 hour(s))  Resp Panel by RT-PCR (Flu A&B, Covid) Nasopharyngeal Swab     Status: None   Collection Time: 03/03/21 10:00 AM   Specimen: Nasopharyngeal Swab; Nasopharyngeal(NP) swabs in vial transport medium  Result Value Ref Range Status   SARS Coronavirus 2 by RT PCR NEGATIVE NEGATIVE Final    Comment: (NOTE) SARS-CoV-2 target nucleic acids are NOT DETECTED.  The SARS-CoV-2 RNA is generally detectable in upper respiratory specimens during the acute phase of infection. The lowest concentration of SARS-CoV-2 viral copies this assay can detect is 138 copies/mL. A negative result does not preclude SARS-Cov-2 infection and should not be used as the sole basis for treatment or other patient management decisions. A negative result may occur with  improper specimen collection/handling, submission of specimen other than nasopharyngeal swab, presence of viral mutation(s) within the areas targeted by this assay, and inadequate number of viral copies(<138 copies/mL). A negative result must be combined with clinical observations, patient history, and epidemiological information. The expected  result is Negative.  Fact Sheet for Patients:  EntrepreneurPulse.com.au  Fact Sheet for Healthcare Providers:  IncredibleEmployment.be  This test is no t yet approved or cleared by the [place] FDA and  has been authorized for detection and/or diagnosis of SARS-CoV-2 by FDA under an Emergency Use Authorization (EUA). This EUA will remain  in effect (meaning this test can be used) for the duration of the COVID-19 declaration under Section 564(b)(1) of the Act, 21 U.S.C.section  360bbb-3(b)(1), unless the authorization is terminated  or revoked sooner.       Influenza A by PCR NEGATIVE NEGATIVE Final   Influenza B by PCR NEGATIVE NEGATIVE Final    Comment: (NOTE) The Xpert Xpress SARS-CoV-2/FLU/RSV plus assay is intended as an aid in the diagnosis of influenza from Nasopharyngeal swab specimens and should not be used as a sole basis for treatment. Nasal washings and aspirates are unacceptable for Xpert Xpress SARS-CoV-2/FLU/RSV testing.  Fact Sheet for Patients: EntrepreneurPulse.com.au  Fact Sheet for Healthcare Providers: IncredibleEmployment.be  This test is not yet approved or cleared by the [place] FDA and has been authorized for detection and/or diagnosis of SARS-CoV-2 by FDA under an Emergency Use Authorization (EUA). This EUA will remain in effect (meaning this test can be used) for the duration of the COVID-19 declaration under Section 564(b)(1) of the Act, 21 U.S.C. section 360bbb-3(b)(1), unless the authorization is terminated or revoked.  Performed at [facility], [place] [address] [place], [place] [address]   Aerobic/Anaerobic Culture w Gram Stain (surgical/deep wound)     Status: None (Preliminary result)   Collection Time: 03/04/21  4:49 PM   Specimen: Wound; Abscess  Result Value Ref Range Status   Specimen Description   Final    WOUND Performed at [facility] [address], [place], [place] [address]    Special Requests   Final    POST CT DEEP SURGICAL Performed at [facility], [place] [address], [place], [place] [address]    Gram Stain   Final    ABUNDANT WBC PRESENT,BOTH PMN AND MONONUCLEAR FEW GRAM POSITIVE COCCI IN PAIRS IN CHAINS    Culture   Final    FEW GRAM NEGATIVE RODS ABUNDANT GROUP B STREP(S.AGALACTIAE)ISOLATED TESTING AGAINST S. AGALACTIAE NOT ROUTINELY PERFORMED DUE TO PREDICTABILITY OF AMP/PEN/VAN SUSCEPTIBILITY. IDENTIFICATION AND SUSCEPTIBILITIES TO FOLLOW Performed at [facility], [place] [address], [place], [place] [address]    Report Status PENDING  Incomplete    Radiology Studies: No results found.    Dineen Conradt T. [place]  If 7PM-7AM, please contact night-coverage [email or web address] 03/06/2021, 2:43 PM

## 2021-03-06 NOTE — Assessment & Plan Note (Addendum)
Likely from infection.  Improving. ?-Antibiotic as above ?

## 2021-03-06 NOTE — Progress Notes (Addendum)
? ?Subjective/Chief Complaint: ?Better llq pain, tender at drain site, no bm, has had some flatus ? ? ?Objective: ?Vital signs in last 24 hours: ?Temp:  [98.4 ?F (36.9 ?C)-99.2 ?F (37.3 ?C)] 98.4 ?F (36.9 ?C) (03/04 6948) ?Pulse Rate:  [82-91] 91 (03/04 0529) ?Resp:  [16-18] 18 (03/04 0529) ?BP: (130-153)/(70-80) 144/73 (03/04 0529) ?SpO2:  [90 %-98 %] 91 % (03/04 0529) ?Last BM Date : 03/02/21 ? ?Intake/Output from previous day: ?03/03 0701 - 03/04 0700 ?In: 5984.5 [P.O.:720; I.V.:4964.4; IV Piggyback:295.2] ?Out: 5462 [Urine:1100; Drains:35] ?Intake/Output this shift: ?No intake/output data recorded. ? ?Gen:  Alert, NAD ?Abd: soft, ND, moderate TTP LLQ without peritonitis, drain with bloody fluid in bag ?  ? ?Lab Results:  ?Recent Labs  ?  03/05/21 ?7035 03/06/21 ?0093  ?WBC 15.6* 15.7*  ?HGB 10.5* 10.9*  ?HCT 32.5* 34.9*  ?PLT 144* 167  ? ?BMET ?Recent Labs  ?  03/05/21 ?8182 03/06/21 ?9937  ?NA 136 137  ?K 3.9 3.7  ?CL 101 100  ?CO2 30 29  ?GLUCOSE 101* 101*  ?BUN 9 6  ?CREATININE 0.78 0.82  ?CALCIUM 8.2* 8.6*  ? ?PT/INR ?Recent Labs  ?  03/04/21 ?1696  ?LABPROT 14.0  ?INR 1.1  ? ?ABG ?No results for input(s): PHART, HCO3 in the last 72 hours. ? ?Invalid input(s): PCO2, PO2 ? ?Studies/Results: ?CT IMAGE GUIDED DRAINAGE BY PERCUTANEOUS CATHETER ? ?Result Date: 03/04/2021 ?INDICATION: Diverticular abscess. Please perform CT-guided percutaneous drainage catheter placement for infection source control purposes. EXAM: CT IMAGE GUIDED DRAINAGE BY PERCUTANEOUS CATHETER COMPARISON:  CT abdomen pelvis-03/03/2021; 01/24/2021 MEDICATIONS: The patient is currently admitted to the hospital and receiving intravenous antibiotics. The antibiotics were administered within an appropriate time frame prior to the initiation of the procedure. ANESTHESIA/SEDATION: Moderate (conscious) sedation was employed during this procedure as administered by the Interventional Radiology RN. A total of Versed 2 mg and Fentanyl 100 mcg was  administered intravenously. Moderate Sedation Time: 14 minutes. The patient's level of consciousness and vital signs were monitored continuously by radiology nursing throughout the procedure under my direct supervision. CONTRAST:  None COMPLICATIONS: None immediate. PROCEDURE: RADIATION DOSE REDUCTION: This exam was performed according to the departmental dose-optimization program which includes automated exposure control, adjustment of the mA and/or kV according to patient size and/or use of iterative reconstruction technique. Informed written consent was obtained from the patient after a discussion of the risks, benefits and alternatives to treatment. The patient was placed supine on the CT gantry and a pre procedural CT was performed re-demonstrating the known abscess/fluid collection within the left lower abdomen/pelvis, adjacent to the posterolateral aspect of the proximal sigmoid colon with dominant collection measuring approximately 4.7 x 4.3 cm (image 20, series 4). The procedure was planned. A timeout was performed prior to the initiation of the procedure. The skin overlying the anterolateral aspect of the lower abdomen/pelvis was prepped and draped in the usual sterile fashion. The overlying soft tissues were anesthetized with 1% lidocaine with epinephrine. Appropriate trajectory was planned with the use of a 22 gauge spinal needle. An 18 gauge trocar needle was advanced into the abscess/fluid collection and a short Amplatz super stiff wire was coiled within the collection. Appropriate positioning was confirmed with a limited CT scan. The tract was serially dilated allowing placement of a 12 [place] all-purpose drainage catheter. Appropriate positioning was confirmed with a limited postprocedural CT scan. Approximally 20 ml of bloody, foul-smelling fluid was aspirated. The tube was connected to a drainage bag and sutured in place. A  dressing was placed. The patient tolerated the procedure well without  immediate post procedural complication. IMPRESSION: Successful CT guided placement of a 42 French all purpose drain catheter into the left lower abdominal/pelvic diverticular abscess with aspiration of 20 mL of purulent fluid. Samples were sent to the laboratory as requested by the ordering clinical team. Electronically Signed   By: Sandi Mariscal M.D.   On: 03/04/2021 17:18   ? ?Anti-infectives: ?Anti-infectives (From admission, onward)  ? ? Start     Dose/Rate Route Frequency Ordered Stop  ? 03/06/21 0915  ertapenem [facility]) 1,000 mg in sodium chloride 0.9 % 100 mL IVPB       ? 1 g ?200 mL/hr over 30 Minutes Intravenous Every 24 hours 03/06/21 0825    ? 03/03/21 2200  piperacillin-tazobactam (ZOSYN) IVPB 3.375 g  Status:  Discontinued       ?See Hyperspace for full Linked Orders Report.  ? 3.375 g ?12.5 mL/hr over 240 Minutes Intravenous Every 8 hours 03/03/21 1600 03/06/21 0825  ? 03/03/21 1600  piperacillin-tazobactam (ZOSYN) IVPB 3.375 g       ?See Hyperspace for full Linked Orders Report.  ? 3.375 g ?100 mL/hr over 30 Minutes Intravenous  Once 03/03/21 1600 03/04/21 0700  ? ?  ? ? ?Assessment/Plan: ?Sigmoid diverticulitis with abscess ?- CT scan 3/1 shows a new 4.8x3.1cm fluid collection in the left lower quadrant adjacent to the wall of the sigmoid colon, likely due to abscess; persistent wall thickening of the sigmoid colon with surrounding inflammatory changes.  ?- s/p IR drain 3/2. Culture pending ?- Continue drain and broad spectrum antibiotics, follow culture. Ok for clear liquids. Mobilize, PT/OT.  ?-WBC stable today, no fever, cultures not showing anything yet, I am going to switch to invanz today to see if this helps in order to try to avoid needing any surgery as this is not going to be in his best interest ?-will consider repeat ct scan next 48 hours ?  ?ID - zosyn 3/1>>ertapenem 3/4 ?VTE - lovenox ?FEN - IVF, CLD, supplements ?Foley - none ?  ?Metastatic lung cancer on chemo, whole brain  radiation ?Obesity BMI 35.43 ?HTN ?GERD ?OSA ?MI in 2006 ?  ?I reviewed hospitalist notes, last 24 h vitals and pain scores, last 48 h intake and output, last 24 h labs and trends, and last 24 h imaging results. ?  ?This care required moderate level of medical decision making.  ? ?Rolm Bookbinder ?03/06/2021 ? ?

## 2021-03-06 NOTE — Plan of Care (Signed)

## 2021-03-07 DIAGNOSIS — T148XXA Other injury of unspecified body region, initial encounter: Secondary | ICD-10-CM

## 2021-03-07 DIAGNOSIS — D72825 Bandemia: Secondary | ICD-10-CM

## 2021-03-07 LAB — CBC WITH DIFFERENTIAL/PLATELET
Abs Immature Granulocytes: 0.12 10*3/uL — ABNORMAL HIGH (ref 0.00–0.07)
Basophils Absolute: 0 10*3/uL (ref 0.0–0.1)
Basophils Relative: 0 %
Eosinophils Absolute: 0 10*3/uL (ref 0.0–0.5)
Eosinophils Relative: 0 %
HCT: 32.2 % — ABNORMAL LOW (ref 39.0–52.0)
Hemoglobin: 10.1 g/dL — ABNORMAL LOW (ref 13.0–17.0)
Immature Granulocytes: 1 %
Lymphocytes Relative: 9 %
Lymphs Abs: 1.2 10*3/uL (ref 0.7–4.0)
MCH: 30.1 pg (ref 26.0–34.0)
MCHC: 31.4 g/dL (ref 30.0–36.0)
MCV: 96.1 fL (ref 80.0–100.0)
Monocytes Absolute: 1.1 10*3/uL — ABNORMAL HIGH (ref 0.1–1.0)
Monocytes Relative: 8 %
Neutro Abs: 11.3 10*3/uL — ABNORMAL HIGH (ref 1.7–7.7)
Neutrophils Relative %: 82 %
Platelets: 161 10*3/uL (ref 150–400)
RBC: 3.35 MIL/uL — ABNORMAL LOW (ref 4.22–5.81)
RDW: 12.6 % (ref 11.5–15.5)
WBC: 13.8 10*3/uL — ABNORMAL HIGH (ref 4.0–10.5)
nRBC: 0 % (ref 0.0–0.2)

## 2021-03-07 LAB — RENAL FUNCTION PANEL
Albumin: 2.7 g/dL — ABNORMAL LOW (ref 3.5–5.0)
Anion gap: 7 (ref 5–15)
BUN: 6 mg/dL (ref 6–20)
CO2: 31 mmol/L (ref 22–32)
Calcium: 8.7 mg/dL — ABNORMAL LOW (ref 8.9–10.3)
Chloride: 99 mmol/L (ref 98–111)
Creatinine, Ser: 0.75 mg/dL (ref 0.61–1.24)
GFR, Estimated: >60 mL/min (ref >60)
Glucose, Bld: 138 mg/dL — ABNORMAL HIGH (ref 70–99)
Phosphorus: 3.3 mg/dL (ref 2.5–4.6)
Potassium: 3.5 mmol/L (ref 3.5–5.1)
Sodium: 137 mmol/L (ref 135–145)

## 2021-03-07 LAB — MAGNESIUM: Magnesium: 2 mg/dL (ref 1.7–2.4)

## 2021-03-07 MED ORDER — POTASSIUM CHLORIDE CRYS ER 20 MEQ PO TBCR
40.0000 meq | EXTENDED_RELEASE_TABLET | Freq: Once | ORAL | Status: AC
  Administered 2021-03-07: 40 meq via ORAL
  Filled 2021-03-07: qty 2

## 2021-03-07 NOTE — Progress Notes (Signed)
PROGRESS NOTE  Raymond [name] GGY:[id number] DOB: Aug 14, 1960   PCP: Pcp, No  Patient is from: Home.  Lives with his wife.  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  Started on IV Zosyn.  General surgery and IR consulted.  Patient had CT-guided drain placement for diverticular abscess on 3/2.  Abscess culture pending.  Antibiotics changed to IV Invanz by general surgery.  Subjective: Seen and examined earlier this morning.  No major events overnight of this morning.  Reports pain over left flank but improved.  No further epistaxis.  No other complaints.  Objective: Vitals:   03/06/21 2031 03/06/21 2333 03/07/21 0551 03/07/21 1233  BP: (!) 156/77 (!) 156/71 (!) 157/81 (!) 157/85  Pulse: 88 85 89 93  Resp:   19 20  Temp: 99.1 F (37.3 C) 98.9 F (37.2 C) 98.6 F (37 C) 99.4 F (37.4 C)  TempSrc: Oral Oral Oral Oral  SpO2: 93% 97% 90% 93%  Weight:      Height:        Examination:  GENERAL: No apparent distress.  Nontoxic. HEENT: MMM.  Vision and hearing grossly intact.  NECK: Supple.  No apparent JVD.  RESP:  No IWOB.  Fair aeration bilaterally. CVS:  RRR. Heart sounds normal.  ABD/GI/GU: BS+. Abd soft, NTND.  Drain output appears bloody but small. MSK/EXT:  Moves extremities. No apparent deformity. No edema.  SKIN: Blister over right forearm-looks stable. NEURO: Awake and alert. Oriented appropriately.  No apparent focal neuro deficit. PSYCH: Calm. Normal affect.   Procedures:  3/2-CT-guided drain placement for diverticular abscess by IR  Microbiology  summarized: COVID-19 and influenza PCR nonreactive. 3/2-abscess culture with GNR, GBS,  Assessment and Plan: * Abscess of sigmoid colon due to diverticulitis S/p CT-guided drain placement on 3/2.  Abscess culture pending.  Leukocytosis improving. -IV Zosyn>> IV Invanz per general surgery -Advance to FLD per general surgery. -Pain control by general surgery -SCD for VTE prophylaxis given bloody output from the drain and epistaxis  Epistaxis Likely from left nares.  No active bleed on exam today.  Lovenox discontinued -Wean off oxygen -Continue saline nasal spray, Afrin nasal spray -SCD for VTE prophylaxis  Normocytic anemia Recent Labs    01/26/21 0405 01/27/21 0357 02/15/21 0927 02/23/21 1020 03/01/21 0910 03/03/21 0958 03/04/21 0523 03/05/21 0515 03/06/21 0544 03/07/21 0520  HGB 10.4* 9.4* 11.0* 12.0* 11.5* 13.5 11.5* 10.5* 10.9* 10.1*  Hgb dropped about 2 g.  Now stable. -Continue SCD for VTE prophylaxis given bloody output from drain, and epistaxis -Continue monitoring  Hypertension BP slightly elevated but improved. -Continue amlodipine to 10 mg daily -Hydralazine as needed  Blister of skin Small isolated skin blister over right forearm.  Appears stable.  No pain or irritation.  Unlikely varicella. -Continue monitoring  Bandemia Likely from infection.  Improving. -Antibiotic as above  Cognitive impairment Seems like he was recently started on memantine.  Not sure if this was related to his whole brain radiation. -Continue home memantine.  Anxiety and depression -Continue Zoloft and Remeron.  RLS (restless legs syndrome) Continue home gabapentin  Adenocarcinoma of left lung, stage 4 (HCC) Status postchemotherapy and whole brain radiation. -Outpatient follow-up with Dr. Julien Nordmann.  TOBACCO ABUSE Encourage tobacco cessation. Nicotine patch as needed  Obesity, unspecified Body mass index is 35.43 kg/m. -Encourage lifestyle change to lose  weight  Increased nutrient needs Body mass index is 35.43 kg/m. Nutrition Problem: Increased nutrient needs Etiology: cancer and cancer related treatments Signs/Symptoms: estimated needs Interventions: Ensure Enlive (each supplement provides 350kcal and 20 grams of protein), MVI   DVT prophylaxis:  Place and maintain sequential compression device Start: 03/05/21 1518  Code Status: Full code Family Communication: None at bedside. Level of care: Med-Surg Status is: Inpatient Remains inpatient appropriate because: Sigmoid diverticulitis with abscess requiring IV antibiotics     Final disposition: Likely home once cleared   Consultants:  General surgery Interventional radiology   Sch Meds:  Scheduled Meds:  amLODipine  10 mg Oral Daily   docusate sodium  100 mg Oral BID   feeding supplement  237 mL Oral BID BM   folic acid  1 mg Oral Daily   gabapentin  300 mg Oral QHS   hydrocerin   Topical BID   loratadine  10 mg Oral Daily   memantine  10 mg Oral BID   mirtazapine  30 mg Oral QHS   multivitamin with minerals  1 tablet Oral Daily   sodium chloride flush  5 mL Intracatheter Q8H   tamsulosin  0.4 mg Oral QPC supper   Continuous Infusions:  ertapenem 1,000 mg (03/07/21 0951)   PRN Meds:.acetaminophen **OR** acetaminophen, hydrALAZINE, HYDROmorphone (DILAUDID) injection, ondansetron **OR** ondansetron (ZOFRAN) IV, oxyCODONE, oxymetazoline, prochlorperazine, sodium chloride  Antimicrobials: Anti-infectives (From admission, onward)    Start     Dose/Rate Route Frequency Ordered Stop   03/06/21 1000  ertapenem (INVANZ) 1,000 mg in sodium chloride 0.9 % 100 mL IVPB        1 g 200 mL/hr over 30 Minutes Intravenous Every 24 hours 03/06/21 0825     03/03/21 2200  piperacillin-tazobactam (ZOSYN) IVPB 3.375 g  Status:  Discontinued       See Hyperspace for full Linked Orders Report.   3.375 g 12.5 mL/hr over 240 Minutes Intravenous Every 8 hours 03/03/21 1600 03/06/21  0825   03/03/21 1600  piperacillin-tazobactam (ZOSYN) IVPB 3.375 g       See Hyperspace for full Linked Orders Report.   3.375 g 100 mL/hr over 30 Minutes Intravenous  Once 03/03/21 1600 03/04/21 0700        I have personally reviewed the following labs and images: CBC: Recent Labs  Lab 03/01/21 0910 03/03/21 0958 03/04/21 0523 03/05/21 0515 03/06/21 0544 03/07/21 0520  WBC 1.4* 7.4 12.7* 15.6* 15.7* 13.8*  NEUTROABS 0.2* 4.6  --   --   --  11.3*  HGB 11.5* 13.5 11.5* 10.5* 10.9* 10.1*  HCT 35.6* 41.1 36.9* 32.5* 34.9* 32.2*  MCV 93.9 94.5 97.4 96.2 96.4 96.1  PLT 121* 160 152 144* 167 161   BMP &GFR Recent Labs  Lab 03/03/21 0958 03/04/21 0523 03/05/21 0515 03/06/21 0544 03/07/21 0520  NA 137 138 136 137 137  K 3.8 4.5 3.9 3.7 3.5  CL 103 102 101 100 99  CO2 26 30 30 29 31   GLUCOSE 116* 99 101* 101* 138*  BUN 17 16 9 6 6   CREATININE 1.33* 0.98 0.78 0.82 0.75  CALCIUM 9.5 8.8* 8.2* 8.6* 8.7*  MG  --  2.1 2.1 2.2 2.0  PHOS  --  3.7 3.6  2.9 3.3   Estimated Creatinine Clearance: 130 mL/min (by C-G formula based on SCr of 0.75 mg/dL). Liver & Pancreas: Recent Labs  Lab 03/01/21 0910 03/03/21 0958 03/04/21 0523 03/05/21 0515 03/06/21 0544 03/07/21 0520  AST 22 28 22   --   --   --   ALT 16 19 16   --   --   --   ALKPHOS 96 94 88  --   --   --   BILITOT 0.8 0.4 0.2*  --   --   --   PROT 7.2 7.5 6.6  --   --   --   ALBUMIN 3.7 3.5 3.0* 2.8* 3.0* 2.7*   Recent Labs  Lab 03/03/21 0958  LIPASE 21   No results for input(s): AMMONIA in the last 168 hours. Diabetic: No results for input(s): HGBA1C in the last 72 hours. No results for input(s): GLUCAP in the last 168 hours. Cardiac Enzymes: No results for input(s): CKTOTAL, CKMB, CKMBINDEX, TROPONINI in the last 168 hours. No results for input(s): PROBNP in the last 8760 hours. Coagulation Profile: Recent Labs  Lab 03/04/21 0523  INR 1.1   Thyroid Function Tests: No results for input(s): TSH, T4TOTAL,  FREET4, T3FREE, THYROIDAB in the last 72 hours. Lipid Profile: No results for input(s): CHOL, HDL, LDLCALC, TRIG, CHOLHDL, LDLDIRECT in the last 72 hours. Anemia Panel: No results for input(s): VITAMINB12, FOLATE, FERRITIN, TIBC, IRON, RETICCTPCT in the last 72 hours. Urine analysis:    Component Value Date/Time   COLORURINE AMBER (A) 03/03/2021 1249   APPEARANCEUR HAZY (A) 03/03/2021 1249   LABSPEC 1.024 03/03/2021 1249   PHURINE 5.0 03/03/2021 1249   GLUCOSEU NEGATIVE 03/03/2021 1249   HGBUR NEGATIVE 03/03/2021 1249   HGBUR moderate 10/17/2007 1442   BILIRUBINUR NEGATIVE 03/03/2021 1249   KETONESUR 5 (A) 03/03/2021 1249   PROTEINUR 100 (A) 03/03/2021 1249   UROBILINOGEN 1.0 07/17/2013 1048   NITRITE NEGATIVE 03/03/2021 1249   LEUKOCYTESUR NEGATIVE 03/03/2021 1249   Sepsis Labs: Invalid input(s): PROCALCITONIN, [place]  Microbiology: Recent Results (from the past 240 hour(s))  Resp Panel by RT-PCR (Flu A&B, Covid) Nasopharyngeal Swab     Status: None   Collection Time: 03/03/21 10:00 AM   Specimen: Nasopharyngeal Swab; Nasopharyngeal(NP) swabs in vial transport medium  Result Value Ref Range Status   SARS Coronavirus 2 by RT PCR NEGATIVE NEGATIVE Final    Comment: (NOTE) SARS-CoV-2 target nucleic acids are NOT DETECTED.  The SARS-CoV-2 RNA is generally detectable in upper respiratory specimens during the acute phase of infection. The lowest concentration of SARS-CoV-2 viral copies this assay can detect is 138 copies/mL. A negative result does not preclude SARS-Cov-2 infection and should not be used as the sole basis for treatment or other patient management decisions. A negative result may occur with  improper specimen collection/handling, submission of specimen other than nasopharyngeal swab, presence of viral mutation(s) within the areas targeted by this assay, and inadequate number of viral copies(<138 copies/mL). A negative result must be combined with clinical  observations, patient history, and epidemiological information. The expected result is Negative.  Fact Sheet for Patients:  EntrepreneurPulse.com.au  Fact Sheet for Healthcare Providers:  IncredibleEmployment.be  This test is no t yet approved or cleared by the [place] FDA and  has been authorized for detection and/or diagnosis of SARS-CoV-2 by FDA under an Emergency Use Authorization (EUA). This EUA will remain  in effect (meaning this test can be used) for the duration of the COVID-19 declaration under Section  564(b)(1) of the Act, 21 U.S.C.section 360bbb-3(b)(1), unless the authorization is terminated  or revoked sooner.       Influenza A by PCR NEGATIVE NEGATIVE Final   Influenza B by PCR NEGATIVE NEGATIVE Final    Comment: (NOTE) The Xpert Xpress SARS-CoV-2/FLU/RSV plus assay is intended as an aid in the diagnosis of influenza from Nasopharyngeal swab specimens and should not be used as a sole basis for treatment. Nasal washings and aspirates are unacceptable for Xpert Xpress SARS-CoV-2/FLU/RSV testing.  Fact Sheet for Patients: EntrepreneurPulse.com.au  Fact Sheet for Healthcare Providers: IncredibleEmployment.be  This test is not yet approved or cleared by the [place] FDA and has been authorized for detection and/or diagnosis of SARS-CoV-2 by FDA under an Emergency Use Authorization (EUA). This EUA will remain in effect (meaning this test can be used) for the duration of the COVID-19 declaration under Section 564(b)(1) of the Act, 21 U.S.C. section 360bbb-3(b)(1), unless the authorization is terminated or revoked.  Performed at [facility], [place] [address], [place], [place] [address]   Aerobic/Anaerobic Culture w Gram Stain (surgical/deep wound)     Status: None (Preliminary result)   Collection Time: 03/04/21  4:49 PM   Specimen: Wound; Abscess  Result Value Ref  Range Status   Specimen Description   Final    WOUND Performed at [place] [address], [place], [place] [address]    Special Requests   Final    POST CT DEEP SURGICAL Performed at [facility], [place] [address] [place], [place] [address]    Gram Stain   Final    ABUNDANT WBC PRESENT,BOTH PMN AND MONONUCLEAR FEW GRAM POSITIVE COCCI IN PAIRS IN CHAINS    Culture   Final    FEW GRAM NEGATIVE RODS ABUNDANT GROUP B STREP(S.AGALACTIAE)ISOLATED TESTING AGAINST S. AGALACTIAE NOT ROUTINELY PERFORMED DUE TO PREDICTABILITY OF AMP/PEN/VAN SUSCEPTIBILITY. IDENTIFICATION AND SUSCEPTIBILITIES TO FOLLOW HOLDING FOR POSSIBLE ANAEROBE Performed at [facility], [place] [address] [place], [place] [address]    Report Status PENDING  Incomplete    Radiology Studies: No results found.    Gunhild Bautch T. [place]  If 7PM-7AM, please contact night-coverage [email or web address] 03/07/2021, 2:43 PM

## 2021-03-07 NOTE — Plan of Care (Signed)

## 2021-03-07 NOTE — Assessment & Plan Note (Signed)
Small isolated skin blister over right forearm.  Appears stable.  No pain or irritation.  Unlikely varicella. ?-Continue monitoring ?

## 2021-03-07 NOTE — Progress Notes (Signed)
? ?  Subjective/Chief Complaint: ?Somnolent, difficult to wake up this am, no complaints ? ? ?Objective: ?Vital signs in last 24 hours: ?Temp:  [98.4 ?F (36.9 ?C)-99.1 ?F (37.3 ?C)] 98.6 ?F (37 ?C) (03/05 0551) ?Pulse Rate:  [80-89] 89 (03/05 0551) ?Resp:  [16-19] 19 (03/05 0551) ?BP: (145-157)/(71-81) 157/81 (03/05 0551) ?SpO2:  [90 %-97 %] 90 % (03/05 0551) ?Last BM Date : 03/02/21 ? ?Intake/Output from previous day: ?03/04 0701 - 03/05 0700 ?In: 1223 [P.O.:622; I.V.:501; IV Piggyback:100] ?Out: -  ?Intake/Output this shift: ?No intake/output data recorded. ? ?Abd soft nontender drain with some bloody fluid ? ?Lab Results:  ?Recent Labs  ?  03/06/21 ?5009 03/07/21 ?3818  ?WBC 15.7* 13.8*  ?HGB 10.9* 10.1*  ?HCT 34.9* 32.2*  ?PLT 167 161  ? ?BMET ?Recent Labs  ?  03/06/21 ?2993 03/07/21 ?7169  ?NA 137 137  ?K 3.7 3.5  ?CL 100 99  ?CO2 29 31  ?GLUCOSE 101* 138*  ?BUN 6 6  ?CREATININE 0.82 0.75  ?CALCIUM 8.6* 8.7*  ? ?PT/INR ?No results for input(s): LABPROT, INR in the last 72 hours. ?ABG ?No results for input(s): PHART, HCO3 in the last 72 hours. ? ?Invalid input(s): PCO2, PO2 ? ?Studies/Results: ?No results found. ? ?Anti-infectives: ?Anti-infectives (From admission, onward)  ? ? Start     Dose/Rate Route Frequency Ordered Stop  ? 03/06/21 1000  ertapenem (INVANZ) 1,000 mg in sodium chloride 0.9 % 100 mL IVPB       ? 1 g ?200 mL/hr over 30 Minutes Intravenous Every 24 hours 03/06/21 0825    ? 03/03/21 2200  piperacillin-tazobactam (ZOSYN) IVPB 3.375 g  Status:  Discontinued       ?See Hyperspace for full Linked Orders Report.  ? 3.375 g ?12.5 mL/hr over 240 Minutes Intravenous Every 8 hours 03/03/21 1600 03/06/21 0825  ? 03/03/21 1600  piperacillin-tazobactam (ZOSYN) IVPB 3.375 g       ?See Hyperspace for full Linked Orders Report.  ? 3.375 g ?100 mL/hr over 30 Minutes Intravenous  Once 03/03/21 1600 03/04/21 0700  ? ?  ? ? ?Assessment/Plan: ?Sigmoid diverticulitis with abscess ?- s/p IR drain 3/2. Culture  pending ?- Continue drain and broad spectrum antibiotics, follow culture. Ok for [employer], PT/OT.  ?-will consider repeat ct scan next 48 hours ?-I think that if he were require an operation for this should have palliative and oncology involved.  Likely hartmanns for this given his current state carries high mortality as well as almost certain dc to a snf and likely permanent cessation of cancer therapy. Would have them both see him. ?ID - zosyn 3/1>>ertapenem 3/4 ?VTE - lovenox ?FEN - IVF, fulls, supplements ?Foley - none ?  ?Metastatic lung cancer on chemo, whole brain radiation ?Obesity BMI 35.43 ?HTN ?GERD ?OSA ?MI in 2006 ?  ?I reviewed hospitalist notes, last 24 h vitals and pain scores, last 48 h intake and output, last 24 h labs and trends, and last 24 h imaging results. ?  ?This care required low level of medical decision making.  ?  ? ? ?Rolm Bookbinder ?03/07/2021 ? ?

## 2021-03-08 ENCOUNTER — Other Ambulatory Visit (HOSPITAL_COMMUNITY): Payer: Self-pay

## 2021-03-08 ENCOUNTER — Telehealth: Payer: Self-pay | Admitting: Internal Medicine

## 2021-03-08 ENCOUNTER — Ambulatory Visit: Payer: 59 | Admitting: Internal Medicine

## 2021-03-08 ENCOUNTER — Ambulatory Visit: Payer: Self-pay

## 2021-03-08 ENCOUNTER — Ambulatory Visit: Payer: Self-pay | Admitting: Internal Medicine

## 2021-03-08 ENCOUNTER — Other Ambulatory Visit: Payer: Self-pay

## 2021-03-08 ENCOUNTER — Ambulatory Visit: Payer: 59

## 2021-03-08 ENCOUNTER — Other Ambulatory Visit: Payer: 59

## 2021-03-08 ENCOUNTER — Inpatient Hospital Stay: Payer: Medicaid Other | Attending: Internal Medicine

## 2021-03-08 DIAGNOSIS — R634 Abnormal weight loss: Secondary | ICD-10-CM | POA: Insufficient documentation

## 2021-03-08 DIAGNOSIS — G893 Neoplasm related pain (acute) (chronic): Secondary | ICD-10-CM | POA: Insufficient documentation

## 2021-03-08 DIAGNOSIS — C7931 Secondary malignant neoplasm of brain: Secondary | ICD-10-CM | POA: Insufficient documentation

## 2021-03-08 DIAGNOSIS — Z801 Family history of malignant neoplasm of trachea, bronchus and lung: Secondary | ICD-10-CM | POA: Insufficient documentation

## 2021-03-08 DIAGNOSIS — Z5111 Encounter for antineoplastic chemotherapy: Secondary | ICD-10-CM | POA: Insufficient documentation

## 2021-03-08 DIAGNOSIS — Z5189 Encounter for other specified aftercare: Secondary | ICD-10-CM | POA: Insufficient documentation

## 2021-03-08 DIAGNOSIS — Z9189 Other specified personal risk factors, not elsewhere classified: Secondary | ICD-10-CM

## 2021-03-08 DIAGNOSIS — K5900 Constipation, unspecified: Secondary | ICD-10-CM | POA: Insufficient documentation

## 2021-03-08 DIAGNOSIS — F17210 Nicotine dependence, cigarettes, uncomplicated: Secondary | ICD-10-CM | POA: Insufficient documentation

## 2021-03-08 DIAGNOSIS — I10 Essential (primary) hypertension: Secondary | ICD-10-CM | POA: Insufficient documentation

## 2021-03-08 DIAGNOSIS — C3412 Malignant neoplasm of upper lobe, left bronchus or lung: Secondary | ICD-10-CM | POA: Insufficient documentation

## 2021-03-08 LAB — RENAL FUNCTION PANEL
Albumin: 2.8 g/dL — ABNORMAL LOW (ref 3.5–5.0)
Anion gap: 8 (ref 5–15)
BUN: 8 mg/dL (ref 6–20)
CO2: 28 mmol/L (ref 22–32)
Calcium: 8.9 mg/dL (ref 8.9–10.3)
Chloride: 102 mmol/L (ref 98–111)
Creatinine, Ser: 0.72 mg/dL (ref 0.61–1.24)
GFR, Estimated: >60 mL/min (ref >60)
Glucose, Bld: 107 mg/dL — ABNORMAL HIGH (ref 70–99)
Phosphorus: 3.2 mg/dL (ref 2.5–4.6)
Potassium: 3.9 mmol/L (ref 3.5–5.1)
Sodium: 138 mmol/L (ref 135–145)

## 2021-03-08 LAB — CBC
HCT: 33.1 % — ABNORMAL LOW (ref 39.0–52.0)
Hemoglobin: 10.3 g/dL — ABNORMAL LOW (ref 13.0–17.0)
MCH: 30.1 pg (ref 26.0–34.0)
MCHC: 31.1 g/dL (ref 30.0–36.0)
MCV: 96.8 fL (ref 80.0–100.0)
Platelets: 171 10*3/uL (ref 150–400)
RBC: 3.42 MIL/uL — ABNORMAL LOW (ref 4.22–5.81)
RDW: 12.8 % (ref 11.5–15.5)
WBC: 12.4 10*3/uL — ABNORMAL HIGH (ref 4.0–10.5)
nRBC: 0 % (ref 0.0–0.2)

## 2021-03-08 LAB — AEROBIC/ANAEROBIC CULTURE W GRAM STAIN (SURGICAL/DEEP WOUND)

## 2021-03-08 LAB — MAGNESIUM: Magnesium: 2 mg/dL (ref 1.7–2.4)

## 2021-03-08 MED ORDER — GUAIFENESIN ER 600 MG PO TB12
1200.0000 mg | ORAL_TABLET | Freq: Two times a day (BID) | ORAL | Status: DC | PRN
  Administered 2021-03-08: 1200 mg via ORAL
  Filled 2021-03-08: qty 2

## 2021-03-08 MED ORDER — MENTHOL 3 MG MT LOZG
1.0000 | LOZENGE | OROMUCOSAL | Status: DC | PRN
  Filled 2021-03-08: qty 9

## 2021-03-08 MED ORDER — BACITRACIN 500 UNIT/GM EX OINT
TOPICAL_OINTMENT | Freq: Two times a day (BID) | CUTANEOUS | Status: DC
  Administered 2021-03-08 – 2021-03-09 (×3): 15.7778 via TOPICAL
  Filled 2021-03-08: qty 14.2
  Filled 2021-03-08: qty 28.35
  Filled 2021-03-08: qty 14

## 2021-03-08 NOTE — Progress Notes (Signed)
? ?  Subjective/Chief Complaint: ?C/o congestion.  Otherwise abdomen is improving.  Tolerating FLD with no issues.  Only pain is when he is trying to get up ? ? ?Objective: ?Vital signs in last 24 hours: ?Temp:  [99 ?F (37.2 ?C)-99.4 ?F (37.4 ?C)] 99.1 ?F (37.3 ?C) (03/06 8588) ?Pulse Rate:  [89-93] 93 (03/06 0512) ?Resp:  [18-20] 18 (03/06 0512) ?BP: (147-157)/(82-87) 147/87 (03/06 5027) ?SpO2:  [88 %-95 %] 95 % (03/06 0512) ?Last BM Date : 03/02/21 ? ?Intake/Output from previous day: ?03/05 0701 - 03/06 0700 ?In: 10  ?Out: 2167 [Urine:2150; Drains:17] ?Intake/Output this shift: ?No intake/output data recorded. ? ?PE: ?Abd: soft, NT, drain with bloody cloudy output, +BS, ND ? ?Lab Results:  ?Recent Labs  ?  03/07/21 ?7412 03/08/21 ?0507  ?WBC 13.8* 12.4*  ?HGB 10.1* 10.3*  ?HCT 32.2* 33.1*  ?PLT 161 171  ? ?BMET ?Recent Labs  ?  03/07/21 ?8786 03/08/21 ?0507  ?NA 137 138  ?K 3.5 3.9  ?CL 99 102  ?CO2 31 28  ?GLUCOSE 138* 107*  ?BUN 6 8  ?CREATININE 0.75 0.72  ?CALCIUM 8.7* 8.9  ? ?PT/INR ?No results for input(s): LABPROT, INR in the last 72 hours. ?ABG ?No results for input(s): PHART, HCO3 in the last 72 hours. ? ?Invalid input(s): PCO2, PO2 ? ?Studies/Results: ?No results found. ? ?Anti-infectives: ?Anti-infectives (From admission, onward)  ? ? Start     Dose/Rate Route Frequency Ordered Stop  ? 03/06/21 1000  ertapenem (INVANZ) 1,000 mg in sodium chloride 0.9 % 100 mL IVPB       ? 1 g ?200 mL/hr over 30 Minutes Intravenous Every 24 hours 03/06/21 0825    ? 03/03/21 2200  piperacillin-tazobactam (ZOSYN) IVPB 3.375 g  Status:  Discontinued       ?See Hyperspace for full Linked Orders Report.  ? 3.375 g ?12.5 mL/hr over 240 Minutes Intravenous Every 8 hours 03/03/21 1600 03/06/21 0825  ? 03/03/21 1600  piperacillin-tazobactam (ZOSYN) IVPB 3.375 g       ?See Hyperspace for full Linked Orders Report.  ? 3.375 g ?100 mL/hr over 30 Minutes Intravenous  Once 03/03/21 1600 03/04/21 0700  ? ?   ? ? ?Assessment/Plan: ?Sigmoid diverticulitis with abscess ?- s/p IR drain 3/2. Culture reveals Group B strep and Gram - rods ?- Continue drain and broad spectrum antibiotics, follow culture.  ?-will consider repeat ct scan next 48 hours, drain output 17cc yesterday ?-I think that if he were require an operation for this should have palliative and oncology involved.  Likely hartmanns for this given his current state carries high mortality as well as almost certain dc to a snf and likely permanent cessation of cancer therapy. Would have them both see him, but right now patient is overall improving and think surgery is not likely. ?-WBC down to 12K ? ?ID - zosyn 3/1>>ertapenem 3/4 ?VTE - lovenox ?FEN - IVF, soft, supplements ?Foley - none ?  ?Metastatic lung cancer on chemo, whole brain radiation ?Obesity BMI 35.43 ?HTN ?GERD ?OSA ?MI in 2006 ?  ?I reviewed hospitalist notes, last 24 h vitals and pain scores, last 48 h intake and output, last 24 h labs and trends, and last 24 h imaging results. ?  ?This care required straightforward level of medical decision making.  ?  ? ? ?Raymond [name] ?03/08/2021 ? ?

## 2021-03-08 NOTE — Progress Notes (Signed)
PROGRESS NOTE  Raymond [name] JKK:[id number] DOB: April 14, 1960   PCP: Pcp, No  Patient is from: Home.  Lives with his wife.  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  Started on IV Zosyn.  General surgery and IR consulted.  Patient had CT-guided drain placement for diverticular abscess on 3/2.  Abscess culture pending.  Antibiotics changed to IV Invanz by general surgery.  Subjective: Seen and examined earlier this morning.  No major events overnight of this morning.  No complaints.  Pain improved.  No nausea or vomiting.  Has not had BM yet.  No further epistaxis.  Objective: Vitals:   03/07/21 2209 03/08/21 0512 03/08/21 0953 03/08/21 1334  BP: (!) 151/82 (!) 147/87 (!) 148/79 124/76  Pulse: 89 93  (!) 103  Resp: 18 18  16   Temp: 99 F (37.2 C) 99.1 F (37.3 C)  98.8 F (37.1 C)  TempSrc: Oral Oral  Oral  SpO2: (!) 88% 95%  (!) 89%  Weight:      Height:        Examination:  GENERAL: No apparent distress.  Nontoxic. HEENT: MMM.  Vision and hearing grossly intact.  NECK: Supple.  No apparent JVD.  RESP:  No IWOB.  Fair aeration bilaterally. CVS:  RRR. Heart sounds normal.  ABD/GI/GU: BS+. Abd soft.  Mild tenderness over LLQ.  Drain output appears bloody but small. MSK/EXT:  Moves extremities. No apparent deformity. No edema.  SKIN: Blister over right forearm.  Stable. NEURO: Awake and alert. Oriented appropriately.  No apparent focal neuro deficit. PSYCH: Calm. Normal affect.   Procedures:  3/2-CT-guided drain placement for diverticular abscess by  IR  Microbiology summarized: COVID-19 and influenza PCR nonreactive. 3/2-abscess culture with GNR, GBS,  Assessment and Plan: * Abscess of sigmoid colon due to diverticulitis S/p CT-guided drain placement on 3/2.  Abscess culture with Streptococcus agalactiae.  Leukocytosis improving. -IV Zosyn>> IV Invanz per general surgery -Diet advanced to soft -Pain control by general surgery -SCD for VTE prophylaxis given bloody output from the drain and epistaxis  Epistaxis Likely from left nares.  No active bleed on exam today.  Lovenox discontinued -Wean off oxygen -Continue saline nasal spray, Afrin nasal spray -SCD for VTE prophylaxis  Normocytic anemia Recent Labs    01/27/21 0357 02/15/21 0927 02/23/21 1020 03/01/21 0910 03/03/21 0958 03/04/21 0523 03/05/21 0515 03/06/21 0544 03/07/21 0520 03/08/21 0507  HGB 9.4* 11.0* 12.0* 11.5* 13.5 11.5* 10.5* 10.9* 10.1* 10.3*  Hgb dropped about 2 g.  Now stable. -Continue SCD for VTE prophylaxis given bloody output from drain, and epistaxis -Continue monitoring  Hypertension BP slightly elevated but improved. -Continue amlodipine to 10 mg daily -Hydralazine as needed  At risk for sleep apnea Needs outpatient sleep study  Blister of skin Small isolated skin blister over right forearm.  Appears stable.  No pain or irritation.  Unlikely varicella. -Continue monitoring  Bandemia Likely from infection.  Improving. -Antibiotic as above  Cognitive impairment Seems like he was recently started on memantine.  Not sure if this was related to his whole brain radiation. -Continue  home memantine.  Anxiety and depression -Continue Zoloft and Remeron.  RLS (restless legs syndrome) Continue home gabapentin  Adenocarcinoma of left lung, stage 4 (HCC) Status postchemotherapy and whole brain radiation. -Outpatient follow-up with Dr. Julien Nordmann.  TOBACCO ABUSE Encourage tobacco cessation. Nicotine patch as needed  Obesity,  unspecified Body mass index is 35.43 kg/m. -Encourage lifestyle change to lose weight  Increased nutrient needs Body mass index is 35.43 kg/m. Nutrition Problem: Increased nutrient needs Etiology: cancer and cancer related treatments Signs/Symptoms: estimated needs Interventions: Ensure Enlive (each supplement provides 350kcal and 20 grams of protein), MVI   DVT prophylaxis:  Place and maintain sequential compression device Start: 03/05/21 1518  Code Status: Full code Family Communication: None at bedside. Level of care: Med-Surg Status is: Inpatient Remains inpatient appropriate because: Sigmoid diverticulitis with abscess requiring IV antibiotics     Final disposition: Likely home once cleared   Consultants:  General surgery Interventional radiology   Sch Meds:  Scheduled Meds:  amLODipine  10 mg Oral Daily   bacitracin   Topical BID   docusate sodium  100 mg Oral BID   feeding supplement  237 mL Oral BID BM   folic acid  1 mg Oral Daily   gabapentin  300 mg Oral QHS   hydrocerin   Topical BID   loratadine  10 mg Oral Daily   memantine  10 mg Oral BID   mirtazapine  30 mg Oral QHS   multivitamin with minerals  1 tablet Oral Daily   sodium chloride flush  5 mL Intracatheter Q8H   tamsulosin  0.4 mg Oral QPC supper   Continuous Infusions:  ertapenem 1,000 mg (03/08/21 1008)   PRN Meds:.acetaminophen **OR** acetaminophen, guaiFENesin, hydrALAZINE, HYDROmorphone (DILAUDID) injection, ondansetron **OR** ondansetron (ZOFRAN) IV, oxyCODONE, prochlorperazine, sodium chloride  Antimicrobials: Anti-infectives (From admission, onward)    Start     Dose/Rate Route Frequency Ordered Stop   03/06/21 1000  ertapenem (INVANZ) 1,000 mg in sodium chloride 0.9 % 100 mL IVPB        1 g 200 mL/hr over 30 Minutes Intravenous Every 24 hours 03/06/21 0825     03/03/21 2200  piperacillin-tazobactam (ZOSYN) IVPB 3.375 g  Status:  Discontinued       See Hyperspace for full Linked  Orders Report.   3.375 g 12.5 mL/hr over 240 Minutes Intravenous Every 8 hours 03/03/21 1600 03/06/21 0825   03/03/21 1600  piperacillin-tazobactam (ZOSYN) IVPB 3.375 g       See Hyperspace for full Linked Orders Report.   3.375 g 100 mL/hr over 30 Minutes Intravenous  Once 03/03/21 1600 03/04/21 0700        I have personally reviewed the following labs and images: CBC: Recent Labs  Lab 03/03/21 0958 03/04/21 0523 03/05/21 0515 03/06/21 0544 03/07/21 0520 03/08/21 0507  WBC 7.4 12.7* 15.6* 15.7* 13.8* 12.4*  NEUTROABS 4.6  --   --   --  11.3*  --   HGB 13.5 11.5* 10.5* 10.9* 10.1* 10.3*  HCT 41.1 36.9* 32.5* 34.9* 32.2* 33.1*  MCV 94.5 97.4 96.2 96.4 96.1 96.8  PLT 160 152 144* 167 161 171   BMP &GFR Recent Labs  Lab 03/04/21 0523 03/05/21 0515 03/06/21 0544 03/07/21 0520 03/08/21 0507  NA 138 136 137 137 138  K 4.5 3.9 3.7 3.5 3.9  CL 102 101 100 99 102  CO2 30 30 29 31 28   GLUCOSE 99 101* 101* 138* 107*  BUN 16 9 6 6 8   CREATININE  0.98 0.78 0.82 0.75 0.72  CALCIUM 8.8* 8.2* 8.6* 8.7* 8.9  MG 2.1 2.1 2.2 2.0 2.0  PHOS 3.7 3.6 2.9 3.3 3.2   Estimated Creatinine Clearance: 130 mL/min (by C-G formula based on SCr of 0.72 mg/dL). Liver & Pancreas: Recent Labs  Lab 03/03/21 0958 03/04/21 0523 03/05/21 0515 03/06/21 0544 03/07/21 0520 03/08/21 0507  AST 28 22  --   --   --   --   ALT 19 16  --   --   --   --   ALKPHOS 94 88  --   --   --   --   BILITOT 0.4 0.2*  --   --   --   --   PROT 7.5 6.6  --   --   --   --   ALBUMIN 3.5 3.0* 2.8* 3.0* 2.7* 2.8*   Recent Labs  Lab 03/03/21 0958  LIPASE 21   No results for input(s): AMMONIA in the last 168 hours. Diabetic: No results for input(s): HGBA1C in the last 72 hours. No results for input(s): GLUCAP in the last 168 hours. Cardiac Enzymes: No results for input(s): CKTOTAL, CKMB, CKMBINDEX, TROPONINI in the last 168 hours. No results for input(s): PROBNP in the last 8760 hours. Coagulation  Profile: Recent Labs  Lab 03/04/21 0523  INR 1.1   Thyroid Function Tests: No results for input(s): TSH, T4TOTAL, FREET4, T3FREE, THYROIDAB in the last 72 hours. Lipid Profile: No results for input(s): CHOL, HDL, LDLCALC, TRIG, CHOLHDL, LDLDIRECT in the last 72 hours. Anemia Panel: No results for input(s): VITAMINB12, FOLATE, FERRITIN, TIBC, IRON, RETICCTPCT in the last 72 hours. Urine analysis:    Component Value Date/Time   COLORURINE AMBER (A) 03/03/2021 1249   APPEARANCEUR HAZY (A) 03/03/2021 1249   LABSPEC 1.024 03/03/2021 1249   PHURINE 5.0 03/03/2021 1249   GLUCOSEU NEGATIVE 03/03/2021 1249   HGBUR NEGATIVE 03/03/2021 1249   HGBUR moderate 10/17/2007 1442   BILIRUBINUR NEGATIVE 03/03/2021 1249   KETONESUR 5 (A) 03/03/2021 1249   PROTEINUR 100 (A) 03/03/2021 1249   UROBILINOGEN 1.0 07/17/2013 1048   NITRITE NEGATIVE 03/03/2021 1249   LEUKOCYTESUR NEGATIVE 03/03/2021 1249   Sepsis Labs: Invalid input(s): PROCALCITONIN, [place]  Microbiology: Recent Results (from the past 240 hour(s))  Resp Panel by RT-PCR (Flu A&B, Covid) Nasopharyngeal Swab     Status: None   Collection Time: 03/03/21 10:00 AM   Specimen: Nasopharyngeal Swab; Nasopharyngeal(NP) swabs in vial transport medium  Result Value Ref Range Status   SARS Coronavirus 2 by RT PCR NEGATIVE NEGATIVE Final    Comment: (NOTE) SARS-CoV-2 target nucleic acids are NOT DETECTED.  The SARS-CoV-2 RNA is generally detectable in upper respiratory specimens during the acute phase of infection. The lowest concentration of SARS-CoV-2 viral copies this assay can detect is 138 copies/mL. A negative result does not preclude SARS-Cov-2 infection and should not be used as the sole basis for treatment or other patient management decisions. A negative result may occur with  improper specimen collection/handling, submission of specimen other than nasopharyngeal swab, presence of viral mutation(s) within the areas targeted  by this assay, and inadequate number of viral copies(<138 copies/mL). A negative result must be combined with clinical observations, patient history, and epidemiological information. The expected result is Negative.  Fact Sheet for Patients:  EntrepreneurPulse.com.au  Fact Sheet for Healthcare Providers:  IncredibleEmployment.be  This test is no t yet approved or cleared by the [place] FDA and  has been authorized for detection  and/or diagnosis of SARS-CoV-2 by FDA under an Emergency Use Authorization (EUA). This EUA will remain  in effect (meaning this test can be used) for the duration of the COVID-19 declaration under Section 564(b)(1) of the Act, 21 U.S.C.section 360bbb-3(b)(1), unless the authorization is terminated  or revoked sooner.       Influenza A by PCR NEGATIVE NEGATIVE Final   Influenza B by PCR NEGATIVE NEGATIVE Final    Comment: (NOTE) The Xpert Xpress SARS-CoV-2/FLU/RSV plus assay is intended as an aid in the diagnosis of influenza from Nasopharyngeal swab specimens and should not be used as a sole basis for treatment. Nasal washings and aspirates are unacceptable for Xpert Xpress SARS-CoV-2/FLU/RSV testing.  Fact Sheet for Patients: EntrepreneurPulse.com.au  Fact Sheet for Healthcare Providers: IncredibleEmployment.be  This test is not yet approved or cleared by the [place] FDA and has been authorized for detection and/or diagnosis of SARS-CoV-2 by FDA under an Emergency Use Authorization (EUA). This EUA will remain in effect (meaning this test can be used) for the duration of the COVID-19 declaration under Section 564(b)(1) of the Act, 21 U.S.C. section 360bbb-3(b)(1), unless the authorization is terminated or revoked.  Performed at [facility], [place] [address] [place], [place] [address]   Aerobic/Anaerobic Culture w Gram Stain (surgical/deep wound)      Status: None   Collection Time: 03/04/21  4:49 PM   Specimen: Wound; Abscess  Result Value Ref Range Status   Specimen Description   Final    WOUND Performed at [place] [address], [place], [place] [address]    Special Requests   Final    POST CT DEEP SURGICAL Performed at [facility], [place] [address], [place], [place] [address]    Gram Stain   Final    ABUNDANT WBC PRESENT,BOTH PMN AND MONONUCLEAR FEW GRAM POSITIVE COCCI IN PAIRS IN CHAINS    Culture   Final    FEW ESCHERICHIA COLI ABUNDANT GROUP B STREP(S.AGALACTIAE)ISOLATED TESTING AGAINST S. AGALACTIAE NOT ROUTINELY PERFORMED DUE TO PREDICTABILITY OF AMP/PEN/VAN SUSCEPTIBILITY. FEW CLOSTRIDIUM INNOCUUM FEW BACTEROIDES DISTASONIS BETA LACTAMASE POSITIVE Performed at [facility], [place] [address], [place], [place] [address]    Report Status 03/08/2021 FINAL  Final   Organism ID, Bacteria ESCHERICHIA COLI  Final      Susceptibility   Escherichia coli - MIC*    AMPICILLIN <=2 SENSITIVE Sensitive     CEFAZOLIN <=4 SENSITIVE Sensitive     CEFEPIME <=0.12 SENSITIVE Sensitive     CEFTAZIDIME <=1 SENSITIVE Sensitive     CEFTRIAXONE <=0.25 SENSITIVE Sensitive     CIPROFLOXACIN <=0.25 SENSITIVE Sensitive     GENTAMICIN <=1 SENSITIVE Sensitive     IMIPENEM <=0.25 SENSITIVE Sensitive     TRIMETH/SULFA <=20 SENSITIVE Sensitive     AMPICILLIN/SULBACTAM <=2 SENSITIVE Sensitive     PIP/TAZO <=4 SENSITIVE Sensitive     * FEW ESCHERICHIA COLI    Radiology Studies: No results found.    Keirah Konitzer T. [place]  If 7PM-7AM, please contact night-coverage [email or web address] 03/08/2021, 3:52 PM

## 2021-03-08 NOTE — Assessment & Plan Note (Signed)
Needs outpatient sleep study ?

## 2021-03-08 NOTE — Progress Notes (Signed)
Referring Physician(s): Margie Billet, PA-C  Supervising Physician: Michaelle Birks  Patient Status:  [facility] - In-pt  Chief Complaint:  LLQ abdominal pain, Nausea and fever found to have a diverticular abscess s/p LLQ abscess drain to the abdominal/pelvic diverticular abscess with aspiration of 20 ml of purulent fluid on 03/04/21 by Dr. Pascal Lux  Subjective: Patient in bed resting. He denies any significant pain or discomfort.   Allergies: Benadryl [diphenhydramine]  Medications: Prior to Admission medications   Medication Sig Start Date End Date Taking? Authorizing Provider  acetaminophen (TYLENOL) 650 MG CR tablet Take 650 mg by mouth every 8 (eight) hours as needed for pain.   Yes [provider]  amLODipine (NORVASC) 5 MG tablet Take 1 tablet (5 mg total) by mouth daily. 01/27/21 01/27/22 Yes [name], MD  dexamethasone (DECADRON) 4 MG tablet Please take 2 tablets 2 times a day the day before, the day of, and the day after treatment. 02/15/21  Yes [name], [name], PA-C  Ensure Plus (ENSURE PLUS) LIQD Take 237 mLs by mouth 3 (three) times daily between meals.   Yes [provider]  folic acid (FOLVITE) 1 MG tablet Take 1 tablet (1 mg total) by mouth daily. 01/27/21 03/31/21 Yes [name], MD  gabapentin (NEURONTIN) 300 MG capsule Take 1 capsule (300 mg total) by mouth at bedtime. 02/15/21 03/17/21 Yes Pickenpack-Cousar, Carlena Sax, NP  guaiFENesin (MUCINEX) 600 MG 12 hr tablet Take 600 mg by mouth 2 (two) times daily.   Yes [provider]  Homeopathic Products (ZICAM ALLERGY RELIEF NA) Place 2 sprays into the nose daily.   Yes [provider]  memantine (NAMENDA) 10 MG tablet Take 1 tablet (10 mg total) by mouth 2 (two) times daily. 01/26/21  Yes [name], PA-C  mirtazapine (REMERON) 30 MG tablet Take 1 tablet (30 mg total) by mouth at bedtime. 02/15/21  Yes Pickenpack-Cousar, Carlena Sax, NP  Misc Natural Products  (OSTEO BI-FLEX ADV JOINT SHIELD) TABS Take 1 tablet by mouth daily.   Yes [provider]  ondansetron (ZOFRAN) 8 MG tablet Take 1 tablet (8 mg total) by mouth every 8 (eight) hours as needed for nausea or vomiting. 02/25/21  Yes Pickenpack-Cousar, Carlena Sax, NP  oxyCODONE (OXY IR/ROXICODONE) 5 MG immediate release tablet Take 1 tablet (5 mg total) by mouth every 6 (six) hours as needed for severe pain. 02/25/21  Yes Pickenpack-Cousar, Carlena Sax, NP  prochlorperazine (COMPAZINE) 10 MG tablet Take 1 tablet (10 mg total) by mouth every 6 (six) hours as needed for nausea or vomiting. 02/25/21  Yes Pickenpack-Cousar, Carlena Sax, NP  simethicone (MYLICON) 031 MG chewable tablet Chew 125 mg by mouth every 6 (six) hours as needed for flatulence.   Yes [provider]  tamsulosin (FLOMAX) 0.4 MG CAPS capsule Take 1 capsule (0.4 mg total) by mouth daily after supper. 01/27/21  Yes [name], MD  Wheat Dextrin (BENEFIBER DRINK MIX) PACK Take 1 Package by mouth daily at 6 (six) AM.   Yes [provider]  memantine (NAMENDA) 5 MG tablet Begin this prescription the first day of brain radiation. Week 1: Take 1 tablet by mouth every morning. Week 2: Take 1 tablet every morning and evening. Week 3: Take 2 tablets every morning, and 1 tablet in the evening. Week 4: Take 2 tablets in the morning and evening. Fill subsequent prescription each month. Patient not taking: Reported on 03/03/2021 01/26/21   [name],  PA-C     Vital Signs: BP 124/76 (BP Location: Right Arm)    Pulse (!) 103    Temp 98.8 F (37.1 C) (Oral)    Resp 16    Ht 5\' 11"  (1.803 m)    Wt 254 lb (115.2 kg)    SpO2 (!) 89%    BMI 35.43 kg/m   Physical Exam Constitutional:      General: He is not in acute distress.    Appearance: He is not ill-appearing.  HENT:     Mouth/Throat:     Mouth: Mucous membranes are moist.     Pharynx: Oropharynx is clear.  Pulmonary:     Effort: Pulmonary effort is normal.   Abdominal:     Palpations: Abdomen is soft.     Comments: LLQ drain to gravity. Dressing is clean/dry. Approximately 20 ml of serosanguineous fluid in gravity bag. Drain easily flushed with 10 ml NS.   Skin:    General: Skin is warm and dry.  Neurological:     Mental Status: He is alert and oriented to person, place, and time.    Imaging: CT IMAGE GUIDED DRAINAGE BY PERCUTANEOUS CATHETER  Result Date: 03/04/2021 INDICATION: Diverticular abscess. Please perform CT-guided percutaneous drainage catheter placement for infection source control purposes. EXAM: CT IMAGE GUIDED DRAINAGE BY PERCUTANEOUS CATHETER COMPARISON:  CT abdomen pelvis-03/03/2021; 01/24/2021 MEDICATIONS: The patient is currently admitted to the hospital and receiving intravenous antibiotics. The antibiotics were administered within an appropriate time frame prior to the initiation of the procedure. ANESTHESIA/SEDATION: Moderate (conscious) sedation was employed during this procedure as administered by the Interventional Radiology RN. A total of Versed 2 mg and Fentanyl 100 mcg was administered intravenously. Moderate Sedation Time: 14 minutes. The patient's level of consciousness and vital signs were monitored continuously by radiology nursing throughout the procedure under my direct supervision. CONTRAST:  None COMPLICATIONS: None immediate. PROCEDURE: RADIATION DOSE REDUCTION: This exam was performed according to the departmental dose-optimization program which includes automated exposure control, adjustment of the mA and/or kV according to patient size and/or use of iterative reconstruction technique. Informed written consent was obtained from the patient after a discussion of the risks, benefits and alternatives to treatment. The patient was placed supine on the CT gantry and a pre procedural CT was performed re-demonstrating the known abscess/fluid collection within the left lower abdomen/pelvis, adjacent to the posterolateral aspect  of the proximal sigmoid colon with dominant collection measuring approximately 4.7 x 4.3 cm (image 20, series 4). The procedure was planned. A timeout was performed prior to the initiation of the procedure. The skin overlying the anterolateral aspect of the lower abdomen/pelvis was prepped and draped in the usual sterile fashion. The overlying soft tissues were anesthetized with 1% lidocaine with epinephrine. Appropriate trajectory was planned with the use of a 22 gauge spinal needle. An 18 gauge trocar needle was advanced into the abscess/fluid collection and a short Amplatz super stiff wire was coiled within the collection. Appropriate positioning was confirmed with a limited CT scan. The tract was serially dilated allowing placement of a 12 [place] all-purpose drainage catheter. Appropriate positioning was confirmed with a limited postprocedural CT scan. Approximally 20 ml of bloody, foul-smelling fluid was aspirated. The tube was connected to a drainage bag and sutured in place. A dressing was placed. The patient tolerated the procedure well without immediate post procedural complication. IMPRESSION: Successful CT guided placement of a 27 French all purpose drain catheter into the left lower abdominal/pelvic diverticular abscess with aspiration  of 20 mL of purulent fluid. Samples were sent to the laboratory as requested by the ordering clinical team. Electronically Signed   By: Sandi Mariscal M.D.   On: 03/04/2021 17:18    Labs:  CBC: Recent Labs    03/05/21 0515 03/06/21 0544 03/07/21 0520 03/08/21 0507  WBC 15.6* 15.7* 13.8* 12.4*  HGB 10.5* 10.9* 10.1* 10.3*  HCT 32.5* 34.9* 32.2* 33.1*  PLT 144* 167 161 171    COAGS: Recent Labs    01/24/21 1200 01/25/21 0344 03/04/21 0523  INR 1.1 1.2 1.1  APTT 28  --   --     BMP: Recent Labs    03/05/21 0515 03/06/21 0544 03/07/21 0520 03/08/21 0507  NA 136 137 137 138  K 3.9 3.7 3.5 3.9  CL 101 100 99 102  CO2 30 29 31 28   GLUCOSE 101*  101* 138* 107*  BUN 9 6 6 8   CALCIUM 8.2* 8.6* 8.7* 8.9  CREATININE 0.78 0.82 0.75 0.72  GFRNONAA >60 >60 >60 >60    LIVER FUNCTION TESTS: Recent Labs    02/23/21 1020 03/01/21 0910 03/03/21 0958 03/04/21 0523 03/05/21 0515 03/06/21 0544 03/07/21 0520 03/08/21 0507  BILITOT 0.4 0.8 0.4 0.2*  --   --   --   --   AST 18 22 28 22   --   --   --   --   ALT 12 16 19 16   --   --   --   --   ALKPHOS 103 96 94 88  --   --   --   --   PROT 8.0 7.2 7.5 6.6  --   --   --   --   ALBUMIN 4.0 3.7 3.5 3.0* 2.8* 3.0* 2.7* 2.8*    Assessment and Plan:  LLQ abdominal pain, Nausea and fever found to have a diverticular abscess s/p LLQ abscess drain to the abdominal/pelvic diverticular abscess with aspiration of 20 ml of purulent fluid on 03/04/21 by Dr. Charletta Cousin Location: LLQ Size: Fr size: 12 Fr Date of placement: 03/04/21 Currently to: Drain collection device: gravity 24 hour output:  Output by Drain (mL) 03/06/21 0701 - 03/06/21 1900 03/06/21 1901 - 03/07/21 0700 03/07/21 0701 - 03/07/21 1900 03/07/21 1901 - 03/08/21 0700 03/08/21 0701 - 03/08/21 1625  Closed System Drain 1 LUQ  12 Fr.    17     Interval imaging/drain manipulation:  None  Current examination: Flushes/aspirates easily.  Insertion site unremarkable. Suture and stat lock in place. Dressed appropriately.   Plan: Continue TID flushes with 5 cc NS. Record output Q shift. Dressing changes QD or PRN if soiled.  Call IR APP or on call IR MD if difficulty flushing or sudden change in drain output.  Repeat imaging/possible drain injection once output < 10 mL/QD (excluding flush material.)  Discharge planning: Please contact IR APP or on call IR MD prior to patient d/c to ensure appropriate follow up plans are in place. Typically patient will follow up with IR clinic 10-14 days post d/c for repeat imaging/possible drain injection. IR scheduler will contact patient with date/time of appointment. Patient will need to flush  drain QD with 5 cc NS, record output QD, dressing changes every 2-3 days or earlier if soiled.   IR will continue to follow - please call with questions or concerns.  Electronically Signed: Soyla Dryer, AGACNP-BC [phone] 03/08/2021, 4:25 PM   I spent a total of 15 Minutes at the the patient's  bedside AND on the patient's hospital floor or unit, greater than 50% of which was counseling/coordinating care for diverticular abscess drain.

## 2021-03-08 NOTE — Telephone Encounter (Signed)
Scheduled per 02/27 los, patient has been called and voicemail was left regarding upcoming appointments. Reminded patient about 03/06 appointment as well. ?

## 2021-03-08 NOTE — Progress Notes (Signed)
Occupational Therapy Treatment ?Patient Details ?Name: Raymond [name] ?MRN: [id number] ?DOB: 04-23-1960 ?Today's Date: 03/08/2021 ? ? ?History of present illness 61 yo male presented 03/03/21 with progressive LLQ pain for about 2 weeks with associated nausea, vomiting and fever, and found to have sigmoid diverticulitis with abscess; s/p IR drain 3/2. 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  ?OT comments ? Patient was educated on LB dressing tasks with total hip kit. Patient was able to teach back use of AE for LB dressing of socks and pants. Patient was noted to be tearful during session with reports of eveyone telling him he looks good but still working towards his normal. Patient's thoughts and concerns were heard and validated. Patient was educated on celebrating the small victories each day while working towards the big goal. Patient verbalized understanding and reported that he was proud of himself for his PT session today. Patient would continue to benefit from skilled OT services at this time while admitted and after d/c to address noted deficits in order to improve overall safety and independence in ADLs.  ?  ? ?Recommendations for follow up therapy are one component of a multi-disciplinary discharge planning process, led by the attending physician.  Recommendations may be updated based on patient status, additional functional criteria and insurance authorization. ?   ?Follow Up Recommendations ? Home health OT  ?  ?Assistance Recommended at Discharge Intermittent Supervision/Assistance  ?Patient can return home with the following ? Assistance with cooking/housework;A little help with walking and/or transfers;A little help with bathing/dressing/bathroom ?  ?Equipment Recommendations ? None recommended by OT  ?  ?Recommendations for Other Services   ? ?  ?Precautions / Restrictions Precautions ?Precautions: Fall ?Precaution Comments: L  drain ?Restrictions ?Weight Bearing Restrictions: No  ? ? ?  ? ?Mobility Bed Mobility ?  ?  ?  ?  ?  ?  ?  ?  ?  ? ?Transfers ?  ?  ?  ?  ?  ?  ?  ?  ?  ?  ?  ?  ?Balance   ?  ?  ?  ?  ?  ?  ?  ?  ?  ?  ?  ?  ?  ?  ?  ?  ?  ?  ?   ? ?ADL either performed or assessed with clinical judgement  ? ?ADL Overall ADL's : Needs assistance/impaired ?  ?  ?  ?  ?  ?  ?  ?  ?  ?  ?Lower Body Dressing: Supervision/safety;Sitting/lateral leans ?Lower Body Dressing Details (indicate cue type and reason): patient was educated on LB dressin tasks with total hip kit. patient was educated on where to purchase theses items patient verbalized understanding. patient was able to don pillowcase over LEs to simpulate pants with reacher with SUP. patietn was able to don socks with sock aid and doff with reacher seated with SUP on RLE. patient reported having increased difficulty on LLE. patient was educated on strategies for LLE> ?  ?  ?  ?  ?  ?  ?  ?General ADL Comments: patient was noted to be tearful at time during session reporting that he did not feel successful today because if he feels good he should be able to go home. patient was educated on how it is good he feels better today but MD will decided to send him home when medically  ready, want to be sure it is not too early. patient verbalized understanding. patient was noted to report brother and family giving him similar advice of apprechating small wins. patient was offered for chaplin services patient declined. patient reproted feeling better having discussed feelings and concerns. patients feelings were heard and validated. ?  ? ?Extremity/Trunk Assessment   ?  ?  ?  ?  ?  ? ?Vision   ?  ?  ?Perception   ?  ?Praxis   ?  ? ?Cognition Arousal/Alertness: Awake/alert ?Behavior During Therapy: [facility] for tasks assessed/performed ?Overall Cognitive Status: Within Functional Limits for tasks assessed ?  ?  ?  ?  ?  ?  ?  ?  ?  ?  ?  ?  ?  ?  ?  ?  ?General Comments: was noted to repeat  somethings during session. patient was also reported he had seen this writer before during previous admission but chart review does not support this ?  ?  ?   ?Exercises   ? ?  ?Shoulder Instructions   ? ? ?  ?General Comments VSS  ? ? ?Pertinent Vitals/ Pain       Pain Assessment ?Pain Assessment: Faces ?Faces Pain Scale: Hurts little more ?Pain Location: abdomen ?Pain Descriptors / Indicators: Sore ?Pain Intervention(s): Limited activity within patient's tolerance, Monitored during session, Premedicated before session ? ?Home Living   ?  ?  ?  ?  ?  ?  ?  ?  ?  ?  ?  ?  ?  ?  ?  ?  ?  ?  ? ?  ?Prior Functioning/Environment    ?  ?  ?  ?   ? ?Frequency ? Min 2X/week  ? ? ? ? ?  ?Progress Toward Goals ? ?OT Goals(current goals can now be found in the care plan section) ?   ? ?Acute Rehab OT Goals ?OT Goal Formulation: With patient ?Time For Goal Achievement: 03/19/21 ?Potential to Achieve Goals: Good  ?Plan     ? ?Co-evaluation ? ? ?   ?  ?  ?  ?  ? ?  ?AM-PAC OT "6 Clicks" Daily Activity     ?Outcome Measure ? ? Help from another person eating meals?: None ?Help from another person taking care of personal grooming?: A Little ?Help from another person toileting, which includes using toliet, bedpan, or urinal?: A Little ?Help from another person bathing (including washing, rinsing, drying)?: A Little ?Help from another person to put on and taking off regular upper body clothing?: A Little ?Help from another person to put on and taking off regular lower body clothing?: A Little ?6 Click Score: 19 ? ?  ?End of Session Equipment Utilized During Treatment: Other (comment) (total hip kit) ? ?OT Visit Diagnosis: Pain ?  ?Activity Tolerance Patient tolerated treatment well ?  ?Patient Left in chair ?  ?Nurse Communication Mobility status ?  ? ?   ? ?Time: 0165-5374 ?OT Time Calculation (min): 35 min ? ?Charges: OT General Charges ?$OT Visit: 1 Visit ?OT Treatments ?$Self Care/Home Management : 23-37 mins ? ?Estel Tonelli  OTR/L, MS ?Acute Rehabilitation Department ?Office# [phone] ?Pager# [phone] ? ? ?Feliz Beam Drystan Reader ?03/08/2021, 3:42 PM ?

## 2021-03-08 NOTE — Progress Notes (Signed)
Physical Therapy Treatment ?Patient Details ?Name: Raymond [name] ?MRN: [id number] ?DOB: May 23, 1960 ?Today's Date: 03/08/2021 ? ? ?History of Present Illness 61 yo male presented 03/03/21 with progressive LLQ pain for about 2 weeks with associated nausea, vomiting and fever, and found to have sigmoid diverticulitis with abscess; s/p IR drain 3/2. 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 ?  ?PT Comments  ? ? Pt making good progress.  He is motivated to ambulate and improve mobility.  Pt had some initial dizziness/"feeling off" with standing and walking - BP was stable, suspect due to IV pain meds prior to session, obtained chair follow for safety.  Continue plan of care.  ?   ?Recommendations for follow up therapy are one component of a multi-disciplinary discharge planning process, led by the attending physician.  Recommendations may be updated based on patient status, additional functional criteria and insurance authorization. ? ?Follow Up Recommendations ? Home health PT ?  ?  ?Assistance Recommended at Discharge Intermittent Supervision/Assistance  ?Patient can return home with the following A little help with walking and/or transfers;A little help with bathing/dressing/bathroom;Assistance with cooking/housework;Help with stairs or ramp for entrance ?  ?Equipment Recommendations ? Rolling walker (2 wheels)  ?  ?Recommendations for Other Services   ? ? ?  ?Precautions / Restrictions Precautions ?Precautions: Fall ?Precaution Comments: L drain  ?  ? ?Mobility ? Bed Mobility ?Overal bed mobility: Needs Assistance ?Bed Mobility: Supine to Sit ?  ?  ?Supine to sit: Supervision, HOB elevated ?  ?  ?  ?  ? ?Transfers ?Overall transfer level: Needs assistance ?Equipment used: Rolling walker (2 wheels) ?Transfers: Sit to/from Stand ?Sit to Stand: Min guard ?  ?  ?  ?  ?  ?General transfer comment: Performed x 3 during session with increased use of  momentum but no physical assist ?  ? ?Ambulation/Gait ?Ambulation/Gait assistance: Min guard ?Gait Distance (Feet): 250 Feet (30' then 250') ?Assistive device: Rolling walker (2 wheels) ?Gait Pattern/deviations: Step-through pattern, Decreased stride length, Wide base of support ?Gait velocity: decreased ?  ?  ?General Gait Details: Pt initially ambulating 30' but reports feeling "off, dizzy" (he had received IV pain meds prior to session).  Returned to chair, pt wanting to walk further.  BP was stable.  Had nursing student follow with recliner and checked BP in standing (still stable).  Pt then ambulated 250' with chair follow, mild unsteadiness but improved after ~100' and pt reports feeling better. 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 ? ? ?Stairs ?  ?  ?  ?  ?  ? ? ?Wheelchair Mobility ?  ? ?Modified Rankin (Stroke Patients Only) ?  ? ? ?  ?Balance Overall balance assessment: Needs assistance ?Sitting-balance support: No upper extremity supported ?Sitting balance-Leahy Scale: Normal ?  ?  ?Standing balance support: Bilateral upper extremity supported, No upper extremity supported ?Standing balance-Leahy Scale: Fair ?Standing balance comment: RW to ambulate but could static stand without AD ?  ?  ?  ?  ?  ?  ?  ?  ?  ?  ?  ?  ? ?  ?Cognition Arousal/Alertness: Awake/alert ?Behavior During Therapy: [facility] for tasks assessed/performed ?Overall Cognitive Status: Within Functional Limits for tasks assessed ?  ?  ?  ?  ?  ?  ?  ?  ?  ?  ?  ?  ?  ?  ?  ?  ?  ?  ?  ? ?  ?  Exercises   ? ?  ?General Comments General comments (skin integrity, edema, etc.): VSS ?  ?  ? ?Pertinent Vitals/Pain Pain Assessment ?Pain Assessment: 0-10 ?Pain Score: 5  ?Pain Location: abdomen ?Pain Descriptors / Indicators: Sore ?Pain Intervention(s): Monitored during session, Limited activity within patient's tolerance, Premedicated before session  ? ? ?Home Living   ?  ?  ?  ?  ?  ?  ?  ?  ?  ?   ?  ?Prior Function    ?  ?  ?   ? ?PT  Goals (current goals can now be found in the care plan section) Progress towards PT goals: Progressing toward goals ? ?  ?Frequency ? ? ? Min 3X/week ? ? ? ?  ?PT Plan Current plan remains appropriate  ? ? ?Co-evaluation   ?  ?  ?  ?  ? ?  ?AM-PAC PT "6 Clicks" Mobility   ?Outcome Measure ? Help needed turning from your back to your side while in a flat bed without using bedrails?: A Little ?Help needed moving from lying on your back to sitting on the side of a flat bed without using bedrails?: A Little ?Help needed moving to and from a bed to a chair (including a wheelchair)?: A Little ?Help needed standing up from a chair using your arms (e.g., wheelchair or bedside chair)?: A Little ?Help needed to walk in hospital room?: A Little ?Help needed climbing 3-5 steps with a railing? : A Little ?6 Click Score: 18 ? ?  ?End of Session Equipment Utilized During Treatment: Gait belt ?Activity Tolerance: Patient tolerated treatment well ?Patient left: in chair;with call bell/phone within reach;with chair alarm set ?Nurse Communication: Mobility status ?PT Visit Diagnosis: Other abnormalities of gait and mobility (R26.89);Pain ?  ? ? ?Time: 5638-9373 ?PT Time Calculation (min) (ACUTE ONLY): 31 min ? ?Charges:  $Gait Training: 8-22 mins ?$Therapeutic Activity: 8-22 mins          ?          ? ?Abran Richard, PT ?Acute Rehab Services ?Pager [phone] ?[facility] [phone] ? ? ? ?Raymond [name] Raymond [name] ?03/08/2021, 2:15 PM ? ?

## 2021-03-09 ENCOUNTER — Other Ambulatory Visit (HOSPITAL_COMMUNITY): Payer: Self-pay

## 2021-03-09 ENCOUNTER — Other Ambulatory Visit: Payer: Self-pay

## 2021-03-09 ENCOUNTER — Inpatient Hospital Stay (HOSPITAL_BASED_OUTPATIENT_CLINIC_OR_DEPARTMENT_OTHER): Payer: Medicaid Other | Admitting: Internal Medicine

## 2021-03-09 ENCOUNTER — Encounter: Payer: Self-pay | Admitting: Internal Medicine

## 2021-03-09 VITALS — BP 135/77 | HR 90 | Temp 98.0°F | Resp 19 | Ht 71.0 in | Wt 260.6 lb

## 2021-03-09 DIAGNOSIS — C7931 Secondary malignant neoplasm of brain: Secondary | ICD-10-CM | POA: Insufficient documentation

## 2021-03-09 DIAGNOSIS — I10 Essential (primary) hypertension: Secondary | ICD-10-CM

## 2021-03-09 DIAGNOSIS — Z5111 Encounter for antineoplastic chemotherapy: Secondary | ICD-10-CM | POA: Diagnosis not present

## 2021-03-09 DIAGNOSIS — Z9189 Other specified personal risk factors, not elsewhere classified: Secondary | ICD-10-CM

## 2021-03-09 MED ORDER — SALINE SPRAY 0.65 % NA SOLN: 2.0000 | NASAL | 0 refills | Status: DC | PRN

## 2021-03-09 MED ORDER — AMOXICILLIN-POT CLAVULANATE 875-125 MG PO TABS
1.0000 | ORAL_TABLET | Freq: Two times a day (BID) | ORAL | 0 refills | Status: DC
  Filled 2021-03-09: qty 28, 14d supply, fill #0

## 2021-03-09 MED ORDER — NORMAL SALINE FLUSH 0.9 % IV SOLN
5.0000 mL | Freq: Every day | INTRAVENOUS | 0 refills | Status: AC
Start: 2021-03-09 — End: 2021-03-29
  Filled 2021-03-09: qty 200, 20d supply, fill #0

## 2021-03-09 MED ORDER — NORMAL SALINE FLUSH 0.9 % IV SOLN
5.0000 mL | Freq: Three times a day (TID) | INTRAVENOUS | 0 refills | Status: AC
  Filled 2021-03-09: qty 450, 15d supply, fill #0

## 2021-03-09 MED ORDER — AMLODIPINE BESYLATE 10 MG PO TABS
10.0000 mg | ORAL_TABLET | Freq: Every day | ORAL | 1 refills | Status: DC
  Filled 2021-03-09: qty 30, 30d supply, fill #0

## 2021-03-09 MED ORDER — POLYETHYLENE GLYCOL 3350 17 G PO PACK
17.0000 g | PACK | Freq: Once | ORAL | Status: AC
  Administered 2021-03-09: 17 g via ORAL
  Filled 2021-03-09: qty 1

## 2021-03-09 MED ORDER — BISACODYL 10 MG RE SUPP: 10.0000 mg | Freq: Every day | RECTAL | Status: DC | PRN

## 2021-03-09 MED ORDER — ADULT MULTIVITAMIN W/MINERALS CH: 1.0000 | ORAL_TABLET | Freq: Every day | ORAL | Status: DC

## 2021-03-09 NOTE — Discharge Summary (Signed)
Physician Discharge Summary  Raymond [name] EPP:[id number] DOB: 1960/09/07 DOA: 03/03/2021  PCP: Pcp, No  Admit date: 03/03/2021 Discharge date: 03/09/2021 Admitted From: Home Disposition: Home Recommendations for Outpatient Follow-up:  Follow ups as below. IR to arrange outpatient follow-up. Recommend referral to sleep clinic for sleep study Please obtain CBC/BMP/Mag at follow up Please follow up on the following pending results: None  Home Health: PT/OT/RN Equipment/Devices: Rolling walker  Discharge Condition: Stable CODE STATUS: Full code  Follow-up Information     Autumn Messing III, MD Follow up in 3 week(s).   Specialty: General Surgery Why: arrive at 1:00 for a 1:30pm appointment for paperwork and check in process.  please bring photo ID and insurance card Contact information: [place] [address] [phone]         [facility]. Schedule an appointment as soon as possible for a visit in 1 week(s).   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  Started on IV Zosyn.  General surgery and IR consulted.  Patient had CT-guided drain placement for diverticular abscess on 3/2.  Antibiotics changed to IV Invanz by general surgery.  Abscess culture with E. coli, Streptococcus agalactiae, few Clostridium innocuum and few Bactroides Distasonis.  Abdominal pain improved.  He tolerated soft diet.  Cleared for discharge by IR and general surgery for outpatient follow-up on p.o. Augmentin for 2 more weeks.  Home health and DME ordered as recommended   See individual problem list below for more on hospital course.  Problems  addressed during this hospitalization Problem  Abscess of Sigmoid Colon Due to Diverticulitis  Normocytic Anemia  Epistaxis  Hypertension  At Risk for Sleep Apnea  Blister of Skin  Bandemia  Cognitive Impairment  Anxiety and Depression  Rls (Restless Legs Syndrome)  Adenocarcinoma of Left Lung, Stage 4 (Hcc)  Obesity, Unspecified   Qualifier: Diagnosis of  By: Brigitte Pulse MD, Kimberlee     TOBACCO ABUSE   Qualifier: Diagnosis of  By: Brigitte Pulse MD, Kimberlee       Assessment and Plan: * Abscess of sigmoid colon due to diverticulitis S/p CT-guided drain placement on 3/2.  Abscess culture polymicrobial.  Abdominal pain and leukocytosis improved.  Tolerated soft diet.  Cleared for discharge by general surgery and IR. -IV Zosyn>> IV Invanz>> p.o. Augmentin for 2 more days -Continue flushing drain with 5 cc NS every 8 hours. -Outpatient follow-up with general surgery and IR  Epistaxis Resolved.  Normocytic anemia Recent Labs    01/27/21 0357 02/15/21 0927 02/23/21 1020 03/01/21 0910 03/03/21 0958 03/04/21 0523 03/05/21 0515 03/06/21 0544 03/07/21 0520 03/08/21 0507  HGB 9.4* 11.0* 12.0* 11.5* 13.5 11.5* 10.5* 10.9* 10.1* 10.3*  Hgb dropped about 2 g.  Now stable. -Repeat CBC with differential at follow-up.  Hypertension Increase home amlodipine to 10 mg daily -Reassess and adjust antihypertensive meds as appropriate -Consider referral to sleep clinic to rule out sleep apnea  At risk for sleep apnea Needs outpatient sleep study  Blister of skin Small isolated skin blister over right forearm.  Appears stable.  No pain or  irritation.  Unlikely varicella. -Continue monitoring  Bandemia Likely from infection.  Improving. -Antibiotic as above  Cognitive impairment Seems like he was recently started on memantine.  Not sure if this was related to his whole brain radiation. -Continue home memantine.  Anxiety and depression -Continue Zoloft and Remeron.  RLS (restless  legs syndrome) Continue home gabapentin  Adenocarcinoma of left lung, stage 4 (HCC) Status postchemotherapy and whole brain radiation. -Outpatient follow-up with Dr. Julien Nordmann.  TOBACCO ABUSE Encourage tobacco cessation. Nicotine patch as needed  Obesity, unspecified Body mass index is 35.43 kg/m. -Encourage lifestyle change to lose weight   Increased nutrient needs Nutrition Problem: Increased nutrient needs Etiology: cancer and cancer related treatments Signs/Symptoms: estimated needs Interventions: Ensure Enlive (each supplement provides 350kcal and 20 grams of protein), MVI     Vital signs Vitals:   03/08/21 0953 03/08/21 1334 03/08/21 2019 03/09/21 0553  BP: (!) 148/79 124/76 (!) 148/87 (!) 141/70  Pulse:  (!) 103 91 86  Temp:  98.8 F (37.1 C) 98.5 F (36.9 C) 98.4 F (36.9 C)  Resp:  16 16 18   Height:      Weight:      SpO2:  (!) 89% 91% 96%  TempSrc:  Oral Oral Oral  BMI (Calculated):         Discharge exam  GENERAL: No apparent distress.  Nontoxic. HEENT: MMM.  Vision and hearing grossly intact.  NECK: Supple.  No apparent JVD.  RESP:  No IWOB.  Fair aeration bilaterally. CVS:  RRR. Heart sounds normal.  ABD/GI/GU: BS+. Abd soft, NTND.  Drain to [employer]. MSK/EXT:  Moves extremities. No apparent deformity. No edema.  SKIN: Small blister over right forearm stable. NEURO: Awake and alert. Oriented appropriately.  No apparent focal neuro deficit. PSYCH: Calm. Normal affect.   Discharge Instructions Discharge Instructions     Call MD for:  extreme fatigue   Complete by: As directed    Call MD for:  persistant nausea and vomiting   Complete by: As directed    Call MD for:  redness, tenderness, or signs of infection (pain, swelling, redness, odor or green/yellow discharge around incision site)   Complete by: As directed    Call MD for:  severe uncontrolled pain   Complete by: As directed    Call MD for:  temperature >100.4   Complete by: As directed     Diet - low sodium heart healthy   Complete by: As directed    Discharge instructions   Complete by: As directed    It has been a pleasure taking care of you!  You were hospitalized due to diverticulitis with abscess for which you have been treated with drain placement and antibiotics.  We are discharging you with drain on more antibiotics to complete treatment course.  Continue flushing your drain 3 times a day as instructed.  Follow-up with general surgeon and interventional radiology per their recommendation.  It is very important that you take your antibiotics until you complete the whole course.   Take care,   Increase activity slowly   Complete by: As directed    No wound care   Complete by: As directed       Allergies as of 03/09/2021       Reactions   Benadryl [diphenhydramine] Itching   01/24/21 pt states this was a one time reaction and that he takes now when needed        Medication List     TAKE these medications  acetaminophen 650 MG CR tablet Commonly known as: TYLENOL Take 650 mg by mouth every 8 (eight) hours as needed for pain.   amLODipine 10 MG tablet Commonly known as: NORVASC Take 1 tablet by mouth daily. Start taking on: March 10, 2021 What changed:  medication strength how much to take   amoxicillin-clavulanate 875-125 MG tablet Commonly known as: Augmentin Take 1 tablet by mouth 2 times daily for 14 days.   Benefiber Drink Mix Pack Take 1 Package by mouth daily at 6 (six) AM.   dexamethasone 4 MG tablet Commonly known as: DECADRON Please take 2 tablets 2 times a day the day before, the day of, and the day after treatment.   Ensure Plus Liqd Take 237 mLs by mouth 3 (three) times daily between meals.   folic acid 1 MG tablet Commonly known as: FOLVITE Take 1 tablet (1 mg total) by mouth daily.   gabapentin 300 MG capsule Commonly known as: Neurontin Take 1 capsule (300 mg total) by mouth at bedtime.   guaiFENesin 600 MG 12 hr  tablet Commonly known as: MUCINEX Take 600 mg by mouth 2 (two) times daily.   memantine 10 MG tablet Commonly known as: Namenda Take 1 tablet (10 mg total) by mouth 2 (two) times daily. What changed: Another medication with the same name was removed. Continue taking this medication, and follow the directions you see here.   mirtazapine 30 MG tablet Commonly known as: Remeron Take 1 tablet (30 mg total) by mouth at bedtime.   multivitamin with minerals Tabs tablet Take 1 tablet by mouth daily. Start taking on: March 10, 2021   Normal Saline Flush 0.9 % Soln Use 5 mLs by Intracatheter route every 8 hours.   Normal Saline Flush 0.9 % Soln Inject 5 mLs by Intracatheter route daily for 20 days.   ondansetron 8 MG tablet Commonly known as: ZOFRAN Take 1 tablet (8 mg total) by mouth every 8 (eight) hours as needed for nausea or vomiting.   Osteo Bi-Flex Adv Joint Shield Tabs Take 1 tablet by mouth daily.   oxyCODONE 5 MG immediate release tablet Commonly known as: Oxy IR/ROXICODONE Take 1 tablet (5 mg total) by mouth every 6 (six) hours as needed for severe pain.   prochlorperazine 10 MG tablet Commonly known as: COMPAZINE Take 1 tablet (10 mg total) by mouth every 6 (six) hours as needed for nausea or vomiting.   simethicone 125 MG chewable tablet Commonly known as: MYLICON Chew 425 mg by mouth every 6 (six) hours as needed for flatulence.   sodium chloride 0.65 % Soln nasal spray Commonly known as: OCEAN Place 2 sprays into both nostrils as needed for congestion (Try this before Afrin).   tamsulosin 0.4 MG Caps capsule Commonly known as: Flomax Take 1 capsule (0.4 mg total) by mouth daily after supper.   ZICAM ALLERGY RELIEF NA Place 2 sprays into the nose daily.        Consultations: General surgery Interventional radiology  Procedures/Studies: IR drain placement for diverticular abscess   CT Abdomen Pelvis W Contrast  Result Date: 03/03/2021 CLINICAL  DATA:  Nonlocalized abdominal pain, history of lung cancer EXAM: CT ABDOMEN AND PELVIS WITH CONTRAST TECHNIQUE: Multidetector CT imaging of the abdomen and pelvis was performed using the standard protocol following bolus administration of intravenous contrast. RADIATION DOSE REDUCTION: This exam was performed according to the departmental dose-optimization program which includes automated exposure control, adjustment of the mA and/or kV according to patient size and/or use  of iterative reconstruction technique. CONTRAST:  178mL OMNIPAQUE IOHEXOL 300 MG/ML  SOLN COMPARISON:  CT abdomen and pelvis dated January 24, 2021; abdominal ultrasound dated January 24, 2021. FINDINGS: Lower chest: Numerous small solid pulmonary nodules, unchanged compared to prior exam. Hepatobiliary: New small low-attenuation liver lesions. Lesion of the right hepatic dome measuring 9 mm on series 2, image 13. Lesion of the left hepatic dome measuring 6 mm on image 13. Cholelithiasis. Unchanged mild gallbladder wall thickening, finding previously evaluated with ultrasound and likely due to adenomyomatosis. No biliary ductal dilation. Pancreas: Unremarkable. No pancreatic ductal dilatation or surrounding inflammatory changes. Spleen: Normal in size without focal abnormality. Adrenals/Urinary Tract: Unchanged thickening of the left adrenal gland. Right adrenal gland is unremarkable. No hydronephrosis. Punctate nonobstructing bilateral renal stones. Bladder is unremarkable. Stomach/Bowel: Wall thickening of the sigmoid colon with surrounding inflammatory change. Associated fluid collection with air-fluid level seen adjacent to the wall of the sigmoid colon measuring 4.8 x 3.1 cm. Vascular/Lymphatic: Aortic atherosclerosis. No enlarged abdominal or pelvic lymph nodes. Reproductive: Mild prostatomegaly. Other: No abdominal wall hernia or abnormality. No abdominopelvic ascites. Musculoskeletal: Moderate degenerative changes of the lumbar spine. No  aggressive osseous lesions. IMPRESSION: 1. New fluid collection is seen in the left lower quadrant adjacent to the wall the sigmoid colon which measures 4.8 x 3.1 cm, likely due to abscess. 2. Persistent wall thickening of the sigmoid colon with surrounding inflammatory change, finding has been present on multiple prior exams, differential considerations include acute on chronic diverticulitis or malignancy, correlate with recent colonoscopy. 3. Numerous small solid pulmonary nodules of the partially visualized lung bases, unchanged compared to prior exam. 4. New subcentimeter lesions of the liver dome, concerning for progressive metastatic disease. 5. Aortic Atherosclerosis (ICD10-I70.0). Electronically Signed   By: Yetta Glassman M.D.   On: 03/03/2021 13:58   CT IMAGE GUIDED DRAINAGE BY PERCUTANEOUS CATHETER  Result Date: 03/04/2021 INDICATION: Diverticular abscess. Please perform CT-guided percutaneous drainage catheter placement for infection source control purposes. EXAM: CT IMAGE GUIDED DRAINAGE BY PERCUTANEOUS CATHETER COMPARISON:  CT abdomen pelvis-03/03/2021; 01/24/2021 MEDICATIONS: The patient is currently admitted to the hospital and receiving intravenous antibiotics. The antibiotics were administered within an appropriate time frame prior to the initiation of the procedure. ANESTHESIA/SEDATION: Moderate (conscious) sedation was employed during this procedure as administered by the Interventional Radiology RN. A total of Versed 2 mg and Fentanyl 100 mcg was administered intravenously. Moderate Sedation Time: 14 minutes. The patient's level of consciousness and vital signs were monitored continuously by radiology nursing throughout the procedure under my direct supervision. CONTRAST:  None COMPLICATIONS: None immediate. PROCEDURE: RADIATION DOSE REDUCTION: This exam was performed according to the departmental dose-optimization program which includes automated exposure control, adjustment of the mA  and/or kV according to patient size and/or use of iterative reconstruction technique. Informed written consent was obtained from the patient after a discussion of the risks, benefits and alternatives to treatment. The patient was placed supine on the CT gantry and a pre procedural CT was performed re-demonstrating the known abscess/fluid collection within the left lower abdomen/pelvis, adjacent to the posterolateral aspect of the proximal sigmoid colon with dominant collection measuring approximately 4.7 x 4.3 cm (image 20, series 4). The procedure was planned. A timeout was performed prior to the initiation of the procedure. The skin overlying the anterolateral aspect of the lower abdomen/pelvis was prepped and draped in the usual sterile fashion. The overlying soft tissues were anesthetized with 1% lidocaine with epinephrine. Appropriate trajectory was planned with the use  of a 22 gauge spinal needle. An 18 gauge trocar needle was advanced into the abscess/fluid collection and a short Amplatz super stiff wire was coiled within the collection. Appropriate positioning was confirmed with a limited CT scan. The tract was serially dilated allowing placement of a 12 [place] all-purpose drainage catheter. Appropriate positioning was confirmed with a limited postprocedural CT scan. Approximally 20 ml of bloody, foul-smelling fluid was aspirated. The tube was connected to a drainage bag and sutured in place. A dressing was placed. The patient tolerated the procedure well without immediate post procedural complication. IMPRESSION: Successful CT guided placement of a 75 French all purpose drain catheter into the left lower abdominal/pelvic diverticular abscess with aspiration of 20 mL of purulent fluid. Samples were sent to the laboratory as requested by the ordering clinical team. Electronically Signed   By: Sandi Mariscal M.D.   On: 03/04/2021 17:18       The results of significant diagnostics from this hospitalization  (including imaging, microbiology, ancillary and laboratory) are listed below for reference.     Microbiology: Recent Results (from the past 240 hour(s))  Resp Panel by RT-PCR (Flu A&B, Covid) Nasopharyngeal Swab     Status: None   Collection Time: 03/03/21 10:00 AM   Specimen: Nasopharyngeal Swab; Nasopharyngeal(NP) swabs in vial transport medium  Result Value Ref Range Status   SARS Coronavirus 2 by RT PCR NEGATIVE NEGATIVE Final    Comment: (NOTE) SARS-CoV-2 target nucleic acids are NOT DETECTED.  The SARS-CoV-2 RNA is generally detectable in upper respiratory specimens during the acute phase of infection. The lowest concentration of SARS-CoV-2 viral copies this assay can detect is 138 copies/mL. A negative result does not preclude SARS-Cov-2 infection and should not be used as the sole basis for treatment or other patient management decisions. A negative result may occur with  improper specimen collection/handling, submission of specimen other than nasopharyngeal swab, presence of viral mutation(s) within the areas targeted by this assay, and inadequate number of viral copies(<138 copies/mL). A negative result must be combined with clinical observations, patient history, and epidemiological information. The expected result is Negative.  Fact Sheet for Patients:  EntrepreneurPulse.com.au  Fact Sheet for Healthcare Providers:  IncredibleEmployment.be  This test is no t yet approved or cleared by the [place] FDA and  has been authorized for detection and/or diagnosis of SARS-CoV-2 by FDA under an Emergency Use Authorization (EUA). This EUA will remain  in effect (meaning this test can be used) for the duration of the COVID-19 declaration under Section 564(b)(1) of the Act, 21 U.S.C.section 360bbb-3(b)(1), unless the authorization is terminated  or revoked sooner.       Influenza A by PCR NEGATIVE NEGATIVE Final   Influenza B by PCR  NEGATIVE NEGATIVE Final    Comment: (NOTE) The Xpert Xpress SARS-CoV-2/FLU/RSV plus assay is intended as an aid in the diagnosis of influenza from Nasopharyngeal swab specimens and should not be used as a sole basis for treatment. Nasal washings and aspirates are unacceptable for Xpert Xpress SARS-CoV-2/FLU/RSV testing.  Fact Sheet for Patients: EntrepreneurPulse.com.au  Fact Sheet for Healthcare Providers: IncredibleEmployment.be  This test is not yet approved or cleared by the [place] FDA and has been authorized for detection and/or diagnosis of SARS-CoV-2 by FDA under an Emergency Use Authorization (EUA). This EUA will remain in effect (meaning this test can be used) for the duration of the COVID-19 declaration under Section 564(b)(1) of the Act, 21 U.S.C. section 360bbb-3(b)(1), unless the authorization is terminated or  revoked.  Performed at [facility], [place] [address], [place], [place] [address]   Aerobic/Anaerobic Culture w Gram Stain (surgical/deep wound)     Status: None   Collection Time: 03/04/21  4:49 PM   Specimen: Wound; Abscess  Result Value Ref Range Status   Specimen Description   Final    WOUND Performed at [facility] [address], [place], [place] [address]    Special Requests   Final    POST CT DEEP SURGICAL Performed at [facility], [place] [address], [place], [place] [address]    Gram Stain   Final    ABUNDANT WBC PRESENT,BOTH PMN AND MONONUCLEAR FEW GRAM POSITIVE COCCI IN PAIRS IN CHAINS    Culture   Final    FEW ESCHERICHIA COLI ABUNDANT GROUP B STREP(S.AGALACTIAE)ISOLATED TESTING AGAINST S. AGALACTIAE NOT ROUTINELY PERFORMED DUE TO PREDICTABILITY OF AMP/PEN/VAN SUSCEPTIBILITY. FEW CLOSTRIDIUM INNOCUUM FEW BACTEROIDES DISTASONIS BETA LACTAMASE POSITIVE Performed at [facility], [place] [address], [place], [place] [address]    Report Status  03/08/2021 FINAL  Final   Organism ID, Bacteria ESCHERICHIA COLI  Final      Susceptibility   Escherichia coli - MIC*    AMPICILLIN <=2 SENSITIVE Sensitive     CEFAZOLIN <=4 SENSITIVE Sensitive     CEFEPIME <=0.12 SENSITIVE Sensitive     CEFTAZIDIME <=1 SENSITIVE Sensitive     CEFTRIAXONE <=0.25 SENSITIVE Sensitive     CIPROFLOXACIN <=0.25 SENSITIVE Sensitive     GENTAMICIN <=1 SENSITIVE Sensitive     IMIPENEM <=0.25 SENSITIVE Sensitive     TRIMETH/SULFA <=20 SENSITIVE Sensitive     AMPICILLIN/SULBACTAM <=2 SENSITIVE Sensitive     PIP/TAZO <=4 SENSITIVE Sensitive     * FEW ESCHERICHIA COLI     Labs:  CBC: Recent Labs  Lab 03/03/21 0958 03/04/21 0523 03/05/21 0515 03/06/21 0544 03/07/21 0520 03/08/21 0507  WBC 7.4 12.7* 15.6* 15.7* 13.8* 12.4*  NEUTROABS 4.6  --   --   --  11.3*  --   HGB 13.5 11.5* 10.5* 10.9* 10.1* 10.3*  HCT 41.1 36.9* 32.5* 34.9* 32.2* 33.1*  MCV 94.5 97.4 96.2 96.4 96.1 96.8  PLT 160 152 144* 167 161 171   BMP &GFR Recent Labs  Lab 03/04/21 0523 03/05/21 0515 03/06/21 0544 03/07/21 0520 03/08/21 0507  NA 138 136 137 137 138  K 4.5 3.9 3.7 3.5 3.9  CL 102 101 100 99 102  CO2 30 30 29 31 28   GLUCOSE 99 101* 101* 138* 107*  BUN 16 9 6 6 8   CREATININE 0.98 0.78 0.82 0.75 0.72  CALCIUM 8.8* 8.2* 8.6* 8.7* 8.9  MG 2.1 2.1 2.2 2.0 2.0  PHOS 3.7 3.6 2.9 3.3 3.2   Estimated Creatinine Clearance: 130 mL/min (by C-G formula based on SCr of 0.72 mg/dL). Liver & Pancreas: Recent Labs  Lab 03/03/21 0958 03/04/21 0523 03/05/21 0515 03/06/21 0544 03/07/21 0520 03/08/21 0507  AST 28 22  --   --   --   --   ALT 19 16  --   --   --   --   ALKPHOS 94 88  --   --   --   --   BILITOT 0.4 0.2*  --   --   --   --   PROT 7.5 6.6  --   --   --   --   ALBUMIN 3.5 3.0* 2.8* 3.0* 2.7* 2.8*   Recent Labs  Lab  03/03/21 0958  LIPASE 21   No results for input(s): AMMONIA in the last 168 hours. Diabetic: No results for input(s): HGBA1C in the last 72  hours. No results for input(s): GLUCAP in the last 168 hours. Cardiac Enzymes: No results for input(s): CKTOTAL, CKMB, CKMBINDEX, TROPONINI in the last 168 hours. No results for input(s): PROBNP in the last 8760 hours. Coagulation Profile: Recent Labs  Lab 03/04/21 0523  INR 1.1   Thyroid Function Tests: No results for input(s): TSH, T4TOTAL, FREET4, T3FREE, THYROIDAB in the last 72 hours. Lipid Profile: No results for input(s): CHOL, HDL, LDLCALC, TRIG, CHOLHDL, LDLDIRECT in the last 72 hours. Anemia Panel: No results for input(s): VITAMINB12, FOLATE, FERRITIN, TIBC, IRON, RETICCTPCT in the last 72 hours. Urine analysis:    Component Value Date/Time   COLORURINE AMBER (A) 03/03/2021 1249   APPEARANCEUR HAZY (A) 03/03/2021 1249   LABSPEC 1.024 03/03/2021 1249   PHURINE 5.0 03/03/2021 1249   GLUCOSEU NEGATIVE 03/03/2021 1249   HGBUR NEGATIVE 03/03/2021 1249   HGBUR moderate 10/17/2007 1442   BILIRUBINUR NEGATIVE 03/03/2021 1249   KETONESUR 5 (A) 03/03/2021 1249   PROTEINUR 100 (A) 03/03/2021 1249   UROBILINOGEN 1.0 07/17/2013 1048   NITRITE NEGATIVE 03/03/2021 1249   LEUKOCYTESUR NEGATIVE 03/03/2021 1249   Sepsis Labs: Invalid input(s): PROCALCITONIN, LACTICIDVEN   Time coordinating discharge: 45 minutes  SIGNED:  Mercy Riding, MD  [facility] 03/09/2021, 11:52 PM

## 2021-03-09 NOTE — Discharge Instructions (Signed)
Flush drain with 5cc saline each day.  Record output volume each day.  Change dressing every 2-3 days or sooner if wet or soiled. ?IR scheduler will call with 2 week follow up appointment. ?Call [phone] with questions or concerns. ?

## 2021-03-09 NOTE — TOC Progression Note (Signed)
Transition of Care (TOC) - Progression Note  ? ? ?Patient Details  ?Name: Raymond [name] ?MRN: [id number] ?Date of Birth: Jul 17, 1960 ? ?Transition of Care (TOC) CM/SW Contact  ?Aryonna Gunnerson, Marjie Skiff, RN ?Phone Number: ?03/09/2021, 10:49 AM ? ?Clinical Narrative:    ?Spoke with pt at bedside for dc planning. He politely declines home health services at this time. He states he has learned how to care for his drain himself. Rolator requested by pt. MD order received. [employer] liaison contacted for delivery to pt room. ? ? ?Expected Discharge Plan: Home/Self Care ?Barriers to Discharge: Continued Medical Work up ? ?Expected Discharge Plan and Services ?Expected Discharge Plan: Home/Self Care ?  ?Discharge Planning Services: CM Consult ?  ?  ?Expected Discharge Date: 03/09/21               ?  ?  ?Readmission Risk Interventions ?Readmission Risk Prevention Plan 03/04/2021  ?Transportation Screening Complete  ?PCP or Specialist Appt within 3-5 Days Complete  ?[place] or Home Care Consult Complete  ?Social Work Consult for [place] Planning/Counseling Complete  ?Palliative Care Screening Not Applicable  ?Medication Review Press photographer) Complete  ?Some recent data might be hidden  ? ? ?

## 2021-03-09 NOTE — Progress Notes (Signed)
[facility], [place] [address] [phone]   New Patient Evaluation  Date of Service: 03/09/21 Patient Name: NIKO PENSON Patient MRN: [id number] Patient DOB: 10-Feb-1960 Provider: Ventura Sellers, MD  Identifying Statement:  TOBY BREITHAUPT is a 61 y.o. male with Brain metastases [facility]) - Plan: MR BRAIN W WO CONTRAST who presents for initial consultation and evaluation regarding cancer associated neurologic deficits.    Referring Provider: [name], Tobe Sos, PA-C [address] [place],  [place] [address]  Primary Cancer:  Oncologic History: Oncology History  Malignant neoplasm of upper lobe of left lung ([place])  09/24/2020 Initial Diagnosis   Malignant neoplasm of upper lobe of left lung ([place])   09/24/2020 Cancer Staging   Staging form: Lung, AJCC 8th Edition - Clinical: Stage IIIA (cT2b, cN2, cM0) - Signed by Curt Bears, MD on 09/24/2020    10/05/2020 - 10/05/2020 Chemotherapy   Patient is on Treatment Plan : LUNG Carboplatin / Paclitaxel + XRT q7d     10/12/2020 - 01/06/2021 Chemotherapy   Patient is on Treatment Plan : LUNG CARBOplatin / Pemetrexed / Pembrolizumab q21d Induction x 4 cycles / Maintenance Pemetrexed + Pembrolizumab     02/23/2021 -  Chemotherapy   Patient is on Treatment Plan : LUNG Docetaxel + Ramucirumab q21d      Adenocarcinoma of left lung, stage 4 ([place])  10/05/2020 Initial Diagnosis   Adenocarcinoma of left lung, stage 4 ([place])   10/12/2020 - 01/06/2021 Chemotherapy   Patient is on Treatment Plan : LUNG CARBOplatin / Pemetrexed / Pembrolizumab q21d Induction x 4 cycles / Maintenance Pemetrexed + Pembrolizumab     02/23/2021 -  Chemotherapy   Patient is on Treatment Plan : LUNG Docetaxel + Ramucirumab q21d       CNS Oncologic History 10/31/20: SRS x14 Lisbeth Renshaw) 02/10/21: Completes WBRT for innumerable mets Lisbeth Renshaw)  History of Present Illness: The patient's records from  the referring physician were obtained and reviewed and the patient interviewed to confirm this HPI.  Jacques Earthly presents today for follow up after recent hospitalization.  He had presented with refractory abdominal pain, found to have a colonic abscess, drained and treated with antibiotics.  He was discharged to home with oral antibiotics and external drain.  Previously had been treated by Dr. Julien Nordmann with docetaxel and cyramza for progressive NSCLC.  He describes occasional tension/mild headaches, fatigue, depression symptoms.  His gait is limited by pain and fatigue, no explicit weakness or balance issues.  He does describe hearing impairment, has upcoming evaluation pending with ENT.    Medications: Current Outpatient Medications on File Prior to Visit  Medication Sig Dispense Refill   acetaminophen (TYLENOL) 650 MG CR tablet Take 650 mg by mouth every 8 (eight) hours as needed for pain.     [START ON 03/10/2021] amLODipine (NORVASC) 10 MG tablet Take 1 tablet by mouth daily. 30 tablet 1   amoxicillin-clavulanate (AUGMENTIN) 875-125 MG tablet Take 1 tablet by mouth 2 times daily for 14 days. 28 tablet 0   dexamethasone (DECADRON) 4 MG tablet Please take 2 tablets 2 times a day the day before, the day of, and the day after treatment. 40 tablet 2   Ensure Plus (ENSURE PLUS) LIQD Take 237 mLs by mouth 3 (three) times daily between meals.     folic acid (FOLVITE) 1 MG tablet Take 1 tablet (1 mg total) by mouth daily. 30 tablet 1   gabapentin (NEURONTIN)  300 MG capsule Take 1 capsule (300 mg total) by mouth at bedtime. 30 capsule 0   guaiFENesin (MUCINEX) 600 MG 12 hr tablet Take 600 mg by mouth 2 (two) times daily.     Homeopathic Products (ZICAM ALLERGY RELIEF NA) Place 2 sprays into the nose daily.     memantine (NAMENDA) 10 MG tablet Take 1 tablet (10 mg total) by mouth 2 (two) times daily. 60 tablet 4   mirtazapine (REMERON) 30 MG tablet Take 1 tablet (30 mg total) by mouth at bedtime. 30 tablet  0   Misc Natural Products (OSTEO BI-FLEX ADV JOINT SHIELD) TABS Take 1 tablet by mouth daily.     [START ON 03/10/2021] Multiple Vitamin (MULTIVITAMIN WITH MINERALS) TABS tablet Take 1 tablet by mouth daily.     ondansetron (ZOFRAN) 8 MG tablet Take 1 tablet (8 mg total) by mouth every 8 (eight) hours as needed for nausea or vomiting. 60 tablet 2   oxyCODONE (OXY IR/ROXICODONE) 5 MG immediate release tablet Take 1 tablet (5 mg total) by mouth every 6 (six) hours as needed for severe pain. 60 tablet 0   prochlorperazine (COMPAZINE) 10 MG tablet Take 1 tablet (10 mg total) by mouth every 6 (six) hours as needed for nausea or vomiting. 90 tablet 2   simethicone (MYLICON) 098 MG chewable tablet Chew 125 mg by mouth every 6 (six) hours as needed for flatulence.     sodium chloride (OCEAN) 0.65 % SOLN nasal spray Place 2 sprays into both nostrils as needed for congestion (Try this before Afrin).  0   Sodium Chloride Flush (NORMAL SALINE FLUSH) 0.9 % SOLN Inject 5 mLs by Intracatheter route daily for 20 days. 200 mL 0   Sodium Chloride Flush (NORMAL SALINE FLUSH) 0.9 % SOLN Use 5 mLs by Intracatheter route every 8 hours. 450 mL 0   tamsulosin (FLOMAX) 0.4 MG CAPS capsule Take 1 capsule (0.4 mg total) by mouth daily after supper. 30 capsule 0   Wheat Dextrin (BENEFIBER DRINK MIX) PACK Take 1 Package by mouth daily at 6 (six) AM.     No current facility-administered medications on file prior to visit.    Allergies:  Allergies  Allergen Reactions   Benadryl [Diphenhydramine] Itching    01/24/21 pt states this was a one time reaction and that he takes now when needed   Past Medical History:  Past Medical History:  Diagnosis Date   Arthritis    Baker's cyst    right   Diverticulitis    GERD (gastroesophageal reflux disease)    Heart attack ([place]) 01/04/2004   History of hiatal hernia    Hypertension    Malignant neoplasm of upper lobe of left lung ([place]) 09/24/2020   Pneumonia    as a child   Sleep  apnea    no longer on Cpap   Past Surgical History:  Past Surgical History:  Procedure Laterality Date   BIOPSY  01/26/2021   Procedure: BIOPSY;  Surgeon: Ronnette Juniper, MD;  Location: [facility];  Service: Gastroenterology;;   BRONCHIAL BIOPSY  09/14/2020   Procedure: BRONCHIAL BIOPSIES;  Surgeon: Collene Gobble, MD;  Location: [facility];  Service: Cardiopulmonary;;   BRONCHIAL BRUSHINGS  09/14/2020   Procedure: BRONCHIAL BRUSHINGS;  Surgeon: Collene Gobble, MD;  Location: [facility];  Service: Cardiopulmonary;;   BRONCHIAL NEEDLE ASPIRATION BIOPSY  09/14/2020   Procedure: BRONCHIAL NEEDLE ASPIRATION BIOPSIES;  Surgeon: Collene Gobble, MD;  Location: [facility];  Service: Cardiopulmonary;;  CARDIAC CATHETERIZATION     CARPAL TUNNEL RELEASE     right   COLONOSCOPY WITH PROPOFOL N/A 01/26/2021   Procedure: COLONOSCOPY WITH PROPOFOL;  Surgeon: Ronnette Juniper, MD;  Location: [facility];  Service: Gastroenterology;  Laterality: N/A;   FIDUCIAL MARKER PLACEMENT  09/14/2020   Procedure: FIDUCIAL MARKER PLACEMENT;  Surgeon: Collene Gobble, MD;  [facility];  Service: Cardiopulmonary;;   HAND SURGERY     left   KNEE SURGERY     left   VIDEO BRONCHOSCOPY N/A 09/14/2020   Procedure: ROBOTIC VIDEO BRONCHOSCOPY WITH FLUORO;  Surgeon: Collene Gobble, MD;  Location: [facility];  Service: Cardiopulmonary;  Laterality: N/A;   VIDEO BRONCHOSCOPY WITH ENDOBRONCHIAL ULTRASOUND N/A 09/14/2020   Procedure: VIDEO BRONCHOSCOPY WITH ENDOBRONCHIAL ULTRASOUND;  Surgeon: Collene Gobble, MD;  Location: [facility];  Service: Cardiopulmonary;  Laterality: N/A;   VIDEO BRONCHOSCOPY WITH RADIAL ENDOBRONCHIAL ULTRASOUND  09/14/2020   Procedure: VIDEO BRONCHOSCOPY WITH RADIAL ENDOBRONCHIAL ULTRASOUND;  Surgeon: Collene Gobble, MD;  Location: [facility];  Service: Cardiopulmonary;;   Social History:  Social History   Socioeconomic History   Marital status: Married    Spouse name: Not on file    Number of children: Not on file   Years of education: Not on file   Highest education level: Not on file  Occupational History   Not on file  Tobacco Use   Smoking status: Every Day    Types: Cigarettes   Smokeless tobacco: Never  Vaping Use   Vaping Use: Never used  Substance and Sexual Activity   Alcohol use: No   Drug use: No   Sexual activity: Not on file  Other Topics Concern   Not on file  Social History Narrative   Not on file   Social Determinants of Health   Financial Resource Strain: High Risk   Difficulty of Paying Living Expenses: Hard  Food Insecurity: No Food Insecurity   Worried About Running Out of Food in the Last Year: Never true   Ran Out of Food in the Last Year: Never true  Transportation Needs: No Transportation Needs   Lack of Transportation (Medical): No   Lack of Transportation (Non-Medical): No  Physical Activity: Not on file  Stress: Stress Concern Present   Feeling of Stress : Very much  Social Connections: Socially Integrated   Frequency of Communication with Friends and Family: More than three times a week   Frequency of Social Gatherings with Friends and Family: More than three times a week   Attends Religious Services: More than 4 times per year   Active Member of [employer] or Organizations: Yes   Attends Music therapist: More than 4 times per year   Marital Status: Married  Human resources officer Violence: Not on file   Family History:  Family History  Problem Relation Age of Onset   Lung cancer Mother    Heart failure Sister     Review of Systems: Constitutional: Doesn't report fevers, chills or abnormal weight loss Eyes: Doesn't report blurriness of vision Ears, nose, mouth, throat, and face: Doesn't report sore throat Respiratory: Doesn't report cough, dyspnea or wheezes Cardiovascular: Doesn't report palpitation, chest discomfort  Gastrointestinal:  abdominal pain GU: Doesn't report incontinence Skin: Doesn't report  skin rashes Neurological: Per HPI Musculoskeletal: back pain Behavioral/Psych: +anxiety  Physical Exam: Vitals:   03/09/21 1411  BP: 135/77  Pulse: 90  Resp: 19  Temp: 98 F (36.7 C)  SpO2: 92%   KPS:  90. General: Alert, cooperative, pleasant, in no acute distress Head: Normal EENT: No conjunctival injection or scleral icterus.  Lungs: Resp effort normal Cardiac: Regular rate Abdomen: Non-distended abdomen Skin: No rashes cyanosis or petechiae. Extremities: No clubbing or edema  Neurologic Exam: Mental Status: Awake, alert, attentive to examiner. Oriented to self and environment. Language is fluent with intact comprehension.  Cranial Nerves: Visual acuity is grossly normal. Visual fields are full. Extra-ocular movements intact. No ptosis. Face is symmetric Motor: Tone and bulk are normal. Power is full in both arms and legs. Reflexes are symmetric, no pathologic reflexes present.  Sensory: Intact to light touch Gait: Normal.   Labs: I have reviewed the data as listed    Component Value Date/Time   NA 138 03/08/2021 0507   K 3.9 03/08/2021 0507   CL 102 03/08/2021 0507   CO2 28 03/08/2021 0507   GLUCOSE 107 (H) 03/08/2021 0507   BUN 8 03/08/2021 0507   CREATININE 0.72 03/08/2021 0507   CREATININE 0.80 03/01/2021 0910   CALCIUM 8.9 03/08/2021 0507   PROT 6.6 03/04/2021 0523   ALBUMIN 2.8 (L) 03/08/2021 0507   ALBUMIN 4.0 05/20/2019 1951   AST 22 03/04/2021 0523   AST 22 03/01/2021 0910   ALT 16 03/04/2021 0523   ALT 16 03/01/2021 0910   ALKPHOS 88 03/04/2021 0523   BILITOT 0.2 (L) 03/04/2021 0523   BILITOT 0.8 03/01/2021 0910   GFRNONAA >60 03/08/2021 0507   GFRNONAA >60 03/01/2021 0910   GFRAA >60 05/25/2019 0427   Lab Results  Component Value Date   WBC 12.4 (H) 03/08/2021   NEUTROABS 11.3 (H) 03/07/2021   HGB 10.3 (L) 03/08/2021   HCT 33.1 (L) 03/08/2021   MCV 96.8 03/08/2021   PLT 171 03/08/2021    Imaging:  CT Abdomen Pelvis W  Contrast  Result Date: 03/03/2021 CLINICAL DATA:  Nonlocalized abdominal pain, history of lung cancer EXAM: CT ABDOMEN AND PELVIS WITH CONTRAST TECHNIQUE: Multidetector CT imaging of the abdomen and pelvis was performed using the standard protocol following bolus administration of intravenous contrast. RADIATION DOSE REDUCTION: This exam was performed according to the departmental dose-optimization program which includes automated exposure control, adjustment of the mA and/or kV according to patient size and/or use of iterative reconstruction technique. CONTRAST:  174mL OMNIPAQUE IOHEXOL 300 MG/ML  SOLN COMPARISON:  CT abdomen and pelvis dated January 24, 2021; abdominal ultrasound dated January 24, 2021. FINDINGS: Lower chest: Numerous small solid pulmonary nodules, unchanged compared to prior exam. Hepatobiliary: New small low-attenuation liver lesions. Lesion of the right hepatic dome measuring 9 mm on series 2, image 13. Lesion of the left hepatic dome measuring 6 mm on image 13. Cholelithiasis. Unchanged mild gallbladder wall thickening, finding previously evaluated with ultrasound and likely due to adenomyomatosis. No biliary ductal dilation. Pancreas: Unremarkable. No pancreatic ductal dilatation or surrounding inflammatory changes. Spleen: Normal in size without focal abnormality. Adrenals/Urinary Tract: Unchanged thickening of the left adrenal gland. Right adrenal gland is unremarkable. No hydronephrosis. Punctate nonobstructing bilateral renal stones. Bladder is unremarkable. Stomach/Bowel: Wall thickening of the sigmoid colon with surrounding inflammatory change. Associated fluid collection with air-fluid level seen adjacent to the wall of the sigmoid colon measuring 4.8 x 3.1 cm. Vascular/Lymphatic: Aortic atherosclerosis. No enlarged abdominal or pelvic lymph nodes. Reproductive: Mild prostatomegaly. Other: No abdominal wall hernia or abnormality. No abdominopelvic ascites. Musculoskeletal: Moderate  degenerative changes of the lumbar spine. No aggressive osseous lesions. IMPRESSION: 1. New fluid collection is seen in the left lower quadrant  adjacent to the wall the sigmoid colon which measures 4.8 x 3.1 cm, likely due to abscess. 2. Persistent wall thickening of the sigmoid colon with surrounding inflammatory change, finding has been present on multiple prior exams, differential considerations include acute on chronic diverticulitis or malignancy, correlate with recent colonoscopy. 3. Numerous small solid pulmonary nodules of the partially visualized lung bases, unchanged compared to prior exam. 4. New subcentimeter lesions of the liver dome, concerning for progressive metastatic disease. 5. Aortic Atherosclerosis (ICD10-I70.0). Electronically Signed   By: Yetta Glassman M.D.   On: 03/03/2021 13:58   CT IMAGE GUIDED DRAINAGE BY PERCUTANEOUS CATHETER  Result Date: 03/04/2021 INDICATION: Diverticular abscess. Please perform CT-guided percutaneous drainage catheter placement for infection source control purposes. EXAM: CT IMAGE GUIDED DRAINAGE BY PERCUTANEOUS CATHETER COMPARISON:  CT abdomen pelvis-03/03/2021; 01/24/2021 MEDICATIONS: The patient is currently admitted to the hospital and receiving intravenous antibiotics. The antibiotics were administered within an appropriate time frame prior to the initiation of the procedure. ANESTHESIA/SEDATION: Moderate (conscious) sedation was employed during this procedure as administered by the Interventional Radiology RN. A total of Versed 2 mg and Fentanyl 100 mcg was administered intravenously. Moderate Sedation Time: 14 minutes. The patient's level of consciousness and vital signs were monitored continuously by radiology nursing throughout the procedure under my direct supervision. CONTRAST:  None COMPLICATIONS: None immediate. PROCEDURE: RADIATION DOSE REDUCTION: This exam was performed according to the departmental dose-optimization program which includes  automated exposure control, adjustment of the mA and/or kV according to patient size and/or use of iterative reconstruction technique. Informed written consent was obtained from the patient after a discussion of the risks, benefits and alternatives to treatment. The patient was placed supine on the CT gantry and a pre procedural CT was performed re-demonstrating the known abscess/fluid collection within the left lower abdomen/pelvis, adjacent to the posterolateral aspect of the proximal sigmoid colon with dominant collection measuring approximately 4.7 x 4.3 cm (image 20, series 4). The procedure was planned. A timeout was performed prior to the initiation of the procedure. The skin overlying the anterolateral aspect of the lower abdomen/pelvis was prepped and draped in the usual sterile fashion. The overlying soft tissues were anesthetized with 1% lidocaine with epinephrine. Appropriate trajectory was planned with the use of a 22 gauge spinal needle. An 18 gauge trocar needle was advanced into the abscess/fluid collection and a short Amplatz super stiff wire was coiled within the collection. Appropriate positioning was confirmed with a limited CT scan. The tract was serially dilated allowing placement of a 12 [place] all-purpose drainage catheter. Appropriate positioning was confirmed with a limited postprocedural CT scan. Approximally 20 ml of bloody, foul-smelling fluid was aspirated. The tube was connected to a drainage bag and sutured in place. A dressing was placed. The patient tolerated the procedure well without immediate post procedural complication. IMPRESSION: Successful CT guided placement of a 21 French all purpose drain catheter into the left lower abdominal/pelvic diverticular abscess with aspiration of 20 mL of purulent fluid. Samples were sent to the laboratory as requested by the ordering clinical team. Electronically Signed   By: Sandi Mariscal M.D.   On: 03/04/2021 17:18     Assessment/Plan Brain  metastases [facility]) - Plan: MR BRAIN W WO CONTRAST  ANTONIA CULBERTSON is clinically stable from neurologic standpoint.  He is making a nice recovery from recent infection, still has hardware in place.    We discussed goals of care from CNS standpoint today.  He understands the importance of controlling systemic  disease to prevent further CNS seeding, and that is limited currently by cytopenias and systemic complications discussed previously.    We recommended continued MRI surveillance at this time.  Headaches are not severe at this time, have improved with increased sleep quality in recent days.  We spent twenty additional minutes teaching regarding the natural history, biology, and historical experience in the treatment of neurologic complications of cancer.   We appreciate the opportunity to participate in the care of LOUI MASSENBURG.   We ask that MOROCCO GIPE return to clinic in 2 months following next brain MRI, or sooner as needed.  All questions were answered. The patient knows to call the clinic with any problems, questions or concerns. No barriers to learning were detected.  The total time spent in the encounter was 40 minutes and more than 50% was on counseling and review of test results   Ventura Sellers, MD Medical Director of Neuro-Oncology [facility] [place] 03/09/21 3:56 PM

## 2021-03-09 NOTE — Progress Notes (Addendum)
Patient ID: Raymond [name], male   DOB: May 16, 1960, 61 y.o.   MRN: [id number] ?[facility] ?Progress Note ? ?   ?Subjective: ?CC-  ?Continues to have some LLQ pain but overall improved. Denies n/v. Tolerating diet without worsening pain. No BM since last Wednesday.  ? ?Objective: ?Vital signs in last 24 hours: ?Temp:  [98.4 ?F (36.9 ?C)-98.8 ?F (37.1 ?C)] 98.4 ?F (36.9 ?C) (03/07 5329) ?Pulse Rate:  [86-103] 86 (03/07 0553) ?Resp:  [16-18] 18 (03/07 0553) ?BP: (124-148)/(70-87) 141/70 (03/07 0553) ?SpO2:  [89 %-96 %] 96 % (03/07 0553) ?Last BM Date : 03/02/21 ? ?Intake/Output from previous day: ?03/06 0701 - 03/07 0700 ?In: 455 [P.O.:240; IV Piggyback:200] ?Out: 9242 [Urine:1200; Drains:45] ?Intake/Output this shift: ?Total I/O ?In: 5 [Other:5] ?Out: 15 [Drains:15] ? ?PE: ?Gen:  Alert, NAD, pleasant ?Abd: soft, mild LLQ TTP around drain, drain with bloody cloudy output (45cc), +BS, ND ? ?Lab Results:  ?Recent Labs  ?  03/07/21 ?6834 03/08/21 ?0507  ?WBC 13.8* 12.4*  ?HGB 10.1* 10.3*  ?HCT 32.2* 33.1*  ?PLT 161 171  ? ?BMET ?Recent Labs  ?  03/07/21 ?1960 03/08/21 ?0507  ?NA 137 138  ?K 3.5 3.9  ?CL 99 102  ?CO2 31 28  ?GLUCOSE 138* 107*  ?BUN 6 8  ?CREATININE 0.75 0.72  ?CALCIUM 8.7* 8.9  ? ?PT/INR ?No results for input(s): LABPROT, INR in the last 72 hours. ?CMP  ?   ?Component Value Date/Time  ? NA 138 03/08/2021 0507  ? K 3.9 03/08/2021 0507  ? CL 102 03/08/2021 0507  ? CO2 28 03/08/2021 0507  ? GLUCOSE 107 (H) 03/08/2021 0507  ? BUN 8 03/08/2021 0507  ? CREATININE 0.72 03/08/2021 0507  ? CREATININE 0.80 03/01/2021 0910  ? CALCIUM 8.9 03/08/2021 0507  ? PROT 6.6 03/04/2021 0523  ? ALBUMIN 2.8 (L) 03/08/2021 0507  ? ALBUMIN 4.0 05/20/2019 1951  ? AST 22 03/04/2021 0523  ? AST 22 03/01/2021 0910  ? ALT 16 03/04/2021 0523  ? ALT 16 03/01/2021 0910  ? ALKPHOS 88 03/04/2021 0523  ? BILITOT 0.2 (L) 03/04/2021 0523  ? BILITOT 0.8 03/01/2021 0910  ? GFRNONAA >60 03/08/2021 0507  ? GFRNONAA >60 03/01/2021 0910   ? GFRAA >60 05/25/2019 0427  ? ?Lipase  ?   ?Component Value Date/Time  ? LIPASE 21 03/03/2021 0958  ? ? ? ? ? ?Studies/Results: ?No results found. ? ?Anti-infectives: ?Anti-infectives (From admission, onward)  ? ? Start     Dose/Rate Route Frequency Ordered Stop  ? 03/06/21 1000  ertapenem (INVANZ) 1,000 mg in sodium chloride 0.9 % 100 mL IVPB       ? 1 g ?200 mL/hr over 30 Minutes Intravenous Every 24 hours 03/06/21 0825    ? 03/03/21 2200  piperacillin-tazobactam (ZOSYN) IVPB 3.375 g  Status:  Discontinued       ?See Hyperspace for full Linked Orders Report.  ? 3.375 g ?12.5 mL/hr over 240 Minutes Intravenous Every 8 hours 03/03/21 1600 03/06/21 0825  ? 03/03/21 1600  piperacillin-tazobactam (ZOSYN) IVPB 3.375 g       ?See Hyperspace for full Linked Orders Report.  ? 3.375 g ?100 mL/hr over 30 Minutes Intravenous  Once 03/03/21 1600 03/04/21 0700  ? ?  ? ? ? ?Assessment/Plan ?Sigmoid diverticulitis with abscess ?- s/p IR drain 3/2. Culture reveals E coli pansensitive  ?- Continue drain and antibiotics. Recommend 14 days total of antibiotics, augmentin at discharge ?- miralax +/- dulcolax suppository today ?- I  spoke with his nurse who will teach him how to flush/  take care of the drain ?- Patient ok for discharge with drain and antibiotics from our standpoint. I will arrange follow up in our office. He also needs to follow up with IR drain clinic. ?  ?ID - zosyn 3/1>>3/4, ertapenem 3/4>> ?VTE - lovenox ?FEN - IVF, soft, supplements ?Foley - none ?  ?Metastatic lung cancer on chemo, whole brain radiation ?Obesity BMI 35.43 ?HTN ?GERD ?OSA ?MI in 2006 ? ?I reviewed hospitalist notes, last 24 h vitals and pain scores, last 48 h intake and output, and last 24 h labs and trends. ? ?This care required moderate level of medical decision making.  ? ? ? LOS: 6 days  ? ? ?Wellington Hampshire, PA-C ?[facility] ?03/09/2021, 8:55 AM ?Please see Amion for pager number during day hours 7:00am-4:30pm ? ?

## 2021-03-09 NOTE — Progress Notes (Signed)
? ? ?Supervising Physician: Juliet Rude ? ?Patient Status:  [facility] - In-pt ? ?Chief Complaint: ? ?Diverticular abscess s/p drain placement ? ?Subjective: ? ?Mild LLQ pain, looking forward to d/c, no n/v ? ?Allergies: ?Benadryl [diphenhydramine] ? ?Medications: ?Prior to Admission medications   ?Medication Sig Start Date End Date Taking? Authorizing Provider  ?acetaminophen (TYLENOL) 650 MG CR tablet Take 650 mg by mouth every 8 (eight) hours as needed for pain.   Yes [provider]  ?amLODipine (NORVASC) 5 MG tablet Take 1 tablet (5 mg total) by mouth daily. 01/27/21 01/27/22 Yes [name], MD  ?dexamethasone (DECADRON) 4 MG tablet Please take 2 tablets 2 times a day the day before, the day of, and the day after treatment. 02/15/21  Yes [name], [name], PA-C  ?Ensure Plus (ENSURE PLUS) LIQD Take 237 mLs by mouth 3 (three) times daily between meals.   Yes [provider]  ?folic acid (FOLVITE) 1 MG tablet Take 1 tablet (1 mg total) by mouth daily. 01/27/21 03/31/21 Yes [name], MD  ?gabapentin (NEURONTIN) 300 MG capsule Take 1 capsule (300 mg total) by mouth at bedtime. 02/15/21 03/17/21 Yes Pickenpack-Cousar, Carlena Sax, NP  ?guaiFENesin (MUCINEX) 600 MG 12 hr tablet Take 600 mg by mouth 2 (two) times daily.   Yes [provider]  ?Homeopathic Products (ZICAM ALLERGY RELIEF NA) Place 2 sprays into the nose daily.   Yes [provider]  ?memantine (NAMENDA) 10 MG tablet Take 1 tablet (10 mg total) by mouth 2 (two) times daily. 01/26/21  Yes [name], PA-C  ?mirtazapine (REMERON) 30 MG tablet Take 1 tablet (30 mg total) by mouth at bedtime. 02/15/21  Yes Pickenpack-Cousar, Carlena Sax, NP  ?Misc Natural Products (OSTEO BI-FLEX ADV JOINT SHIELD) TABS Take 1 tablet by mouth daily.   Yes [provider]  ?ondansetron (ZOFRAN) 8 MG tablet Take 1 tablet (8 mg total) by mouth every 8 (eight) hours as needed for nausea or vomiting.  02/25/21  Yes Pickenpack-Cousar, Carlena Sax, NP  ?oxyCODONE (OXY IR/ROXICODONE) 5 MG immediate release tablet Take 1 tablet (5 mg total) by mouth every 6 (six) hours as needed for severe pain. 02/25/21  Yes Pickenpack-Cousar, Carlena Sax, NP  ?prochlorperazine (COMPAZINE) 10 MG tablet Take 1 tablet (10 mg total) by mouth every 6 (six) hours as needed for nausea or vomiting. 02/25/21  Yes Pickenpack-Cousar, Carlena Sax, NP  ?simethicone (MYLICON) 694 MG chewable tablet Chew 125 mg by mouth every 6 (six) hours as needed for flatulence.   Yes [provider]  ?tamsulosin (FLOMAX) 0.4 MG CAPS capsule Take 1 capsule (0.4 mg total) by mouth daily after supper. 01/27/21  Yes [name], MD  ?Wheat Dextrin (BENEFIBER DRINK MIX) PACK Take 1 Package by mouth daily at 6 (six) AM.   Yes [provider]  ?memantine (NAMENDA) 5 MG tablet Begin this prescription the first day of brain radiation. Week 1: Take 1 tablet by mouth every morning. Week 2: Take 1 tablet every morning and evening. Week 3: Take 2 tablets every morning, and 1 tablet in the evening. Week 4: Take 2 tablets in the morning and evening. Fill subsequent prescription each month. ?Patient not taking: Reported on 03/03/2021 01/26/21   [name], PA-C  ? ? ? ?Vital Signs: ?BP (!) 141/70 (BP Location: Right Arm)   Pulse 86   Temp 98.4 ?F (36.9 ?C) (Oral)   Resp 18   Ht 5\' 11"  (1.803  m)   Wt 254 lb (115.2 kg)   SpO2 96%   BMI 35.43 kg/m?  ? ?Physical Exam ?Vitals reviewed.  ?Constitutional:   ?   General: He is not in acute distress. ?   Appearance: He is well-developed. He is not ill-appearing.  ?HENT:  ?   Head: Normocephalic.  ?Eyes:  ?   Extraocular Movements: Extraocular movements intact.  ?Cardiovascular:  ?   Rate and Rhythm: Normal rate.  ?Pulmonary:  ?   Effort: Pulmonary effort is normal.  ?Abdominal:  ?   Palpations: Abdomen is soft.  ?Skin: ?   General: Skin is warm and dry.  ?Neurological:  ?   General: No focal deficit  present.  ?   Mental Status: He is alert and oriented to person, place, and time.  ?Psychiatric:     ?   Mood and Affect: Mood normal.     ?   Behavior: Behavior normal.  ?Drain: ?Drain Location: LLQ ?Size: Fr size: 12 Fr ?Date of placement: 03/04/21  ?Currently to: Drain collection device: gravity ?24 hour output:  ?Output by Drain (mL) 03/07/21 0701 - 03/07/21 1900 03/07/21 1901 - 03/08/21 0700 03/08/21 0701 - 03/08/21 1900 03/08/21 1901 - 03/09/21 0700 03/09/21 0701 - 03/09/21 1021  ?Closed System Drain 1 LUQ  12 Fr.  17 30 15 15   ? ?Current examination: ?Flushes/aspirates easily.  ?Insertion site unremarkable. ?Suture and stat lock in place. ?Dressed appropriately.  ? ?Imaging: ?No results found. ? ?Labs: ? ?CBC: ?Recent Labs  ?  03/05/21 ?2703 03/06/21 ?5009 03/07/21 ?3818 03/08/21 ?0507  ?WBC 15.6* 15.7* 13.8* 12.4*  ?HGB 10.5* 10.9* 10.1* 10.3*  ?HCT 32.5* 34.9* 32.2* 33.1*  ?PLT 144* 167 161 171  ? ? ?COAGS: ?Recent Labs  ?  01/24/21 ?1200 01/25/21 ?2993 03/04/21 ?7169  ?INR 1.1 1.2 1.1  ?APTT 28  --   --   ? ? ?BMP: ?Recent Labs  ?  03/05/21 ?6789 03/06/21 ?3810 03/07/21 ?1751 03/08/21 ?0507  ?NA 136 137 137 138  ?K 3.9 3.7 3.5 3.9  ?CL 101 100 99 102  ?CO2 30 29 31 28   ?GLUCOSE 101* 101* 138* 107*  ?BUN 9 6 6 8   ?CALCIUM 8.2* 8.6* 8.7* 8.9  ?CREATININE 0.78 0.82 0.75 0.72  ?GFRNONAA >60 >60 >60 >60  ? ? ?LIVER FUNCTION TESTS: ?Recent Labs  ?  02/23/21 ?1020 03/01/21 ?0910 03/03/21 ?0958 03/04/21 ?0258 03/05/21 ?5277 03/06/21 ?8242 03/07/21 ?3536 03/08/21 ?0507  ?BILITOT 0.4 0.8 0.4 0.2*  --   --   --   --   ?AST 18 22 28 22   --   --   --   --   ?ALT 12 16 19 16   --   --   --   --   ?ALKPHOS 103 96 94 88  --   --   --   --   ?PROT 8.0 7.2 7.5 6.6  --   --   --   --   ?ALBUMIN 4.0 3.7 3.5 3.0* 2.8* 3.0* 2.7* 2.8*  ? ? ?Assessment and Plan: ? ?Diverticular abscess - ?5 days s/p drain placement ?Clinically improving ?OK for d/c from IR perspective ?Will place order for clinic appointment to be made with IR  scheduler in 2 weeks for CT and injection ?Discussed with patient need to flush drain QD with 5 cc NS, record output QD, dressing changes every 2-3 days or earlier if soiled.  ? ? ? ?Electronically Signed: ?Pasty Spillers, PA ?03/09/2021, 10:12  AM ? ? ?I spent a total of 15 Minutes at the the patient's bedside AND on the patient's hospital floor or unit, greater than 50% of which was counseling/coordinating care for diverticular abscess drain. ? ? ? ? ? ?

## 2021-03-12 ENCOUNTER — Telehealth: Payer: Self-pay | Admitting: Medical Oncology

## 2021-03-12 NOTE — Telephone Encounter (Signed)
Pt has questions about his drainage bag /catheter placed by Radiology last week. ?

## 2021-03-12 NOTE — Progress Notes (Addendum)
Pt called and left message that he needs help with abdominal abscess drain. Called pt and he states that there has been no OP overnight or today. Pt was advised to flush with NS and see if that clears tube. He will monitor for OP today and overnight. He will call IR if there is no OP by tomorrow. Pt in agreement with plan. ? ? ?Narda Rutherford, AGNP-BC ?03/12/2021, 1:46 PM ? ? ?

## 2021-03-15 ENCOUNTER — Telehealth: Payer: Self-pay | Admitting: Medical Oncology

## 2021-03-15 ENCOUNTER — Ambulatory Visit
Admission: RE | Admit: 2021-03-15 | Discharge: 2021-03-15 | Disposition: A | Discharge status: Home / Self Care | Payer: 59 | Source: Ambulatory Visit | Attending: Internal Medicine | Admitting: Internal Medicine

## 2021-03-15 ENCOUNTER — Other Ambulatory Visit: Payer: Self-pay

## 2021-03-15 ENCOUNTER — Inpatient Hospital Stay: Payer: Medicaid Other | Admitting: *Deleted

## 2021-03-15 DIAGNOSIS — C7931 Secondary malignant neoplasm of brain: Secondary | ICD-10-CM | POA: Insufficient documentation

## 2021-03-15 DIAGNOSIS — C3492 Malignant neoplasm of unspecified part of left bronchus or lung: Secondary | ICD-10-CM

## 2021-03-15 MED FILL — Dexamethasone Sodium Phosphate Inj 100 MG/10ML: INTRAMUSCULAR | Qty: 1 | Status: AC

## 2021-03-15 NOTE — Progress Notes (Signed)
[place] Advance Directives ?Clinical Social Work ? ?Patient presented to [facility]  to review and complete healthcare advance directives.  Chaplain met with patient.  The patient designated [employer] as their primary healthcare agent . Patient also completed healthcare living will.   ? ?Documents were notarized and copies made for patient/family. Clinical Social Worker will send documents to medical records to be scanned into patient's chart. ?Chaplain encouraged patient/family to contact with any additional questions or concerns. ? ?Discussion was facilitated by chaplain, Lorrin Jackson. ? ?Gwinda Maine, LCSW ?Clinical Social Worker ?[place]      ?  ?

## 2021-03-15 NOTE — Telephone Encounter (Signed)
Raymond [name] concerned about his  percutaneous catheter /bag. He is having mild abdominal pain and the catheter is not draining much . He flushed it this am.  ?He asked me to check it.  ? ?The catheter /drainage bag/dressing  is intact and there is less than 20 ml yellowish liquid in the bag. The tube was kinked so I showed him and wife  how to position the catheter. He has been flushing it tid per IR. I told him to continue that and keep appts for tomorrow. ?"I just wanted to make sure it was okay. Ill flush it when I get home". ?

## 2021-03-15 NOTE — Progress Notes (Deleted)
[place] Spiritual Care Note ? ?Raymond [name] completed his Advance Directives in [facility] today, choosing his wife Raymond [name] as his health care agent and specifying that, in the event of a conflict between the living will and the health care agent, his desire is for the team to follow the health care agent. ? ? ?Chaplain Lorrin Jackson, MDiv, [facility] ?Pager [phone] ?Voicemail [phone]  ?

## 2021-03-16 ENCOUNTER — Encounter: Payer: Self-pay | Admitting: Internal Medicine

## 2021-03-16 ENCOUNTER — Other Ambulatory Visit: Payer: Self-pay | Admitting: Student

## 2021-03-16 ENCOUNTER — Encounter: Payer: Self-pay | Admitting: Nurse Practitioner

## 2021-03-16 ENCOUNTER — Telehealth: Payer: Self-pay | Admitting: Student

## 2021-03-16 ENCOUNTER — Inpatient Hospital Stay (HOSPITAL_BASED_OUTPATIENT_CLINIC_OR_DEPARTMENT_OTHER): Payer: Medicaid Other | Admitting: Internal Medicine

## 2021-03-16 ENCOUNTER — Inpatient Hospital Stay: Payer: Medicaid Other | Admitting: Dietician

## 2021-03-16 ENCOUNTER — Inpatient Hospital Stay (HOSPITAL_BASED_OUTPATIENT_CLINIC_OR_DEPARTMENT_OTHER): Payer: Medicaid Other | Admitting: Nurse Practitioner

## 2021-03-16 ENCOUNTER — Encounter: Payer: Self-pay | Admitting: *Deleted

## 2021-03-16 ENCOUNTER — Inpatient Hospital Stay: Payer: Medicaid Other

## 2021-03-16 ENCOUNTER — Other Ambulatory Visit (HOSPITAL_COMMUNITY): Payer: Self-pay

## 2021-03-16 VITALS — BP 143/90 | HR 118 | Temp 97.5°F | Resp 19 | Ht 71.0 in | Wt 249.2 lb

## 2021-03-16 DIAGNOSIS — Z5189 Encounter for other specified aftercare: Secondary | ICD-10-CM | POA: Diagnosis not present

## 2021-03-16 DIAGNOSIS — I10 Essential (primary) hypertension: Secondary | ICD-10-CM | POA: Diagnosis not present

## 2021-03-16 DIAGNOSIS — Z515 Encounter for palliative care: Secondary | ICD-10-CM | POA: Diagnosis not present

## 2021-03-16 DIAGNOSIS — C3412 Malignant neoplasm of upper lobe, left bronchus or lung: Secondary | ICD-10-CM

## 2021-03-16 DIAGNOSIS — R634 Abnormal weight loss: Secondary | ICD-10-CM

## 2021-03-16 DIAGNOSIS — R630 Anorexia: Secondary | ICD-10-CM

## 2021-03-16 DIAGNOSIS — F17210 Nicotine dependence, cigarettes, uncomplicated: Secondary | ICD-10-CM | POA: Diagnosis not present

## 2021-03-16 DIAGNOSIS — Z5111 Encounter for antineoplastic chemotherapy: Secondary | ICD-10-CM | POA: Diagnosis not present

## 2021-03-16 DIAGNOSIS — R188 Other ascites: Secondary | ICD-10-CM

## 2021-03-16 DIAGNOSIS — Z801 Family history of malignant neoplasm of trachea, bronchus and lung: Secondary | ICD-10-CM | POA: Diagnosis not present

## 2021-03-16 DIAGNOSIS — T85590A Other mechanical complication of bile duct prosthesis, initial encounter: Secondary | ICD-10-CM

## 2021-03-16 DIAGNOSIS — C3492 Malignant neoplasm of unspecified part of left bronchus or lung: Secondary | ICD-10-CM

## 2021-03-16 DIAGNOSIS — C7931 Secondary malignant neoplasm of brain: Secondary | ICD-10-CM | POA: Diagnosis not present

## 2021-03-16 DIAGNOSIS — G893 Neoplasm related pain (acute) (chronic): Secondary | ICD-10-CM

## 2021-03-16 DIAGNOSIS — K5900 Constipation, unspecified: Secondary | ICD-10-CM | POA: Diagnosis not present

## 2021-03-16 DIAGNOSIS — R531 Weakness: Secondary | ICD-10-CM

## 2021-03-16 LAB — CMP (CANCER CENTER ONLY)
ALT: 20 U/L (ref 0–44)
AST: 20 U/L (ref 15–41)
Albumin: 3.7 g/dL (ref 3.5–5.0)
Alkaline Phosphatase: 114 U/L (ref 38–126)
Anion gap: 10 (ref 5–15)
BUN: 15 mg/dL (ref 6–20)
CO2: 23 mmol/L (ref 22–32)
Calcium: 9.6 mg/dL (ref 8.9–10.3)
Chloride: 106 mmol/L (ref 98–111)
Creatinine: 0.8 mg/dL (ref 0.61–1.24)
GFR, Estimated: >60 mL/min (ref >60)
Glucose, Bld: 177 mg/dL — ABNORMAL HIGH (ref 70–99)
Potassium: 4.3 mmol/L (ref 3.5–5.1)
Sodium: 139 mmol/L (ref 135–145)
Total Bilirubin: 0.3 mg/dL (ref 0.3–1.2)
Total Protein: 7.3 g/dL (ref 6.5–8.1)

## 2021-03-16 LAB — CBC WITH DIFFERENTIAL (CANCER CENTER ONLY)
Abs Immature Granulocytes: 0.1 10*3/uL — ABNORMAL HIGH (ref 0.00–0.07)
Basophils Absolute: 0 10*3/uL (ref 0.0–0.1)
Basophils Relative: 0 %
Eosinophils Absolute: 0 10*3/uL (ref 0.0–0.5)
Eosinophils Relative: 0 %
HCT: 37.8 % — ABNORMAL LOW (ref 39.0–52.0)
Hemoglobin: 12.4 g/dL — ABNORMAL LOW (ref 13.0–17.0)
Immature Granulocytes: 1 %
Lymphocytes Relative: 5 %
Lymphs Abs: 0.8 10*3/uL (ref 0.7–4.0)
MCH: 30.3 pg (ref 26.0–34.0)
MCHC: 32.8 g/dL (ref 30.0–36.0)
MCV: 92.4 fL (ref 80.0–100.0)
Monocytes Absolute: 0.2 10*3/uL (ref 0.1–1.0)
Monocytes Relative: 1 %
Neutro Abs: 14.7 10*3/uL — ABNORMAL HIGH (ref 1.7–7.7)
Neutrophils Relative %: 93 %
Platelet Count: 392 10*3/uL (ref 150–400)
RBC: 4.09 MIL/uL — ABNORMAL LOW (ref 4.22–5.81)
RDW: 13.1 % (ref 11.5–15.5)
WBC Count: 15.8 10*3/uL — ABNORMAL HIGH (ref 4.0–10.5)
nRBC: 0 % (ref 0.0–0.2)

## 2021-03-16 LAB — TOTAL PROTEIN, URINE DIPSTICK: Protein, ur: <30 mg/dL — AB

## 2021-03-16 MED ORDER — MIRTAZAPINE 30 MG PO TABS
30.0000 mg | ORAL_TABLET | Freq: Every day | ORAL | 0 refills | Status: DC
  Filled 2021-03-16: qty 30, 30d supply, fill #0

## 2021-03-16 MED ORDER — FLUTICASONE PROPIONATE 50 MCG/ACT NA SUSP
1.0000 | Freq: Every day | NASAL | 2 refills | Status: DC
  Filled 2021-03-16: qty 16, 30d supply, fill #0

## 2021-03-16 MED ORDER — OXYCODONE HCL 5 MG PO TABS
5.0000 mg | ORAL_TABLET | Freq: Four times a day (QID) | ORAL | 0 refills | Status: DC | PRN
  Filled 2021-03-16: qty 60, 15d supply, fill #0

## 2021-03-16 NOTE — Progress Notes (Signed)
?    [place] ?[phone]   [phone] ? ?OFFICE PROGRESS NOTE ? ?Pcp, No ?No address on file ? ?DIAGNOSIS: Stage IV (T2b, N2, M1c) non-small cell lung cancer, adenocarcinoma presented with left upper lobe lung mass in addition to left hilar and mediastinal lymphadenopathy diagnosed in September 2022. He also has metastatic disease to the brain.  ?  ?Molecular Studies: No actionable mutations on tissue tested by [facility] ? ?PD-L1 expression 10%. ? ?PRIOR THERAPY: ?1) SRS to the metastatic brain lesions under the care of Dr. Lisbeth Renshaw Scheduled for 10/21/20 ?2) Whole brain radiation under the care of Dr. Lisbeth Renshaw. Completed on 02/10/21 ?3)  Carboplatin for an AUC of 5, Alimta 500 mg per metered squared, Keytruda 200 mg IV every 3 weeks. First dose on 10/12/20. Status post 5 cycles. Discontinued due to disease progression  ?  ?CURRENT THERAPY: Docetaxel and Cyramaza IV every 3 weeks with neulasta support.  Status post 1 cycle. ? ? ?INTERVAL HISTORY: ?Raymond [name] 61 y.o. male returns to the clinic today for follow-up visit accompanied by his wife.  The patient is feeling fine today with no concerning complaints except for the baseline fatigue.  He was admitted to the hospital with left lower quadrant abdominal pain and he was found to haveAnd he was found on imaging studies to have sigmoid diverticulitis with abscess formation.  He underwent drainage by interventional radiology.  The patient did not have follow-up appointment with interventional radiology for management of his drain.  He has no current nausea, vomiting, diarrhea or constipation.  He has no chest pain but has shortness of breath with exertion with no cough or hemoptysis.  He is here today for evaluation before starting cycle #2 of his chemotherapy. ? ?MEDICAL HISTORY: ?Past Medical History:  ?Diagnosis Date  ? Arthritis   ? Baker's cyst   ? right  ? Diverticulitis   ? GERD (gastroesophageal reflux disease)   ?  Heart attack ([place]) 01/04/2004  ? History of hiatal hernia   ? Hypertension   ? Malignant neoplasm of upper lobe of left lung ([place]) 09/24/2020  ? Pneumonia   ? as a child  ? Sleep apnea   ? no longer on Cpap  ? ? ?ALLERGIES:  is allergic to benadryl [diphenhydramine]. ? ?MEDICATIONS:  ?Current Outpatient Medications  ?Medication Sig Dispense Refill  ? acetaminophen (TYLENOL) 650 MG CR tablet Take 650 mg by mouth every 8 (eight) hours as needed for pain.    ? amLODipine (NORVASC) 10 MG tablet Take 1 tablet by mouth daily. 30 tablet 1  ? amoxicillin-clavulanate (AUGMENTIN) 875-125 MG tablet Take 1 tablet by mouth 2 times daily for 14 days. 28 tablet 0  ? dexamethasone (DECADRON) 4 MG tablet Please take 2 tablets 2 times a day the day before, the day of, and the day after treatment. 40 tablet 2  ? Ensure Plus (ENSURE PLUS) LIQD Take 237 mLs by mouth 3 (three) times daily between meals.    ? folic acid (FOLVITE) 1 MG tablet Take 1 tablet (1 mg total) by mouth daily. 30 tablet 1  ? gabapentin (NEURONTIN) 300 MG capsule Take 1 capsule (300 mg total) by mouth at bedtime. 30 capsule 0  ? guaiFENesin (MUCINEX) 600 MG 12 hr tablet Take 600 mg by mouth 2 (two) times daily.    ? Homeopathic Products (ZICAM ALLERGY RELIEF NA) Place 2 sprays into the nose daily.    ? memantine (NAMENDA) 10 MG tablet Take  1 tablet (10 mg total) by mouth 2 (two) times daily. 60 tablet 4  ? mirtazapine (REMERON) 30 MG tablet Take 1 tablet (30 mg total) by mouth at bedtime. 30 tablet 0  ? Misc Natural Products (OSTEO BI-FLEX ADV JOINT SHIELD) TABS Take 1 tablet by mouth daily.    ? Multiple Vitamin (MULTIVITAMIN WITH MINERALS) TABS tablet Take 1 tablet by mouth daily.    ? ondansetron (ZOFRAN) 8 MG tablet Take 1 tablet (8 mg total) by mouth every 8 (eight) hours as needed for nausea or vomiting. 60 tablet 2  ? oxyCODONE (OXY IR/ROXICODONE) 5 MG immediate release tablet Take 1 tablet (5 mg total) by mouth every 6 (six) hours as needed for severe pain.  60 tablet 0  ? prochlorperazine (COMPAZINE) 10 MG tablet Take 1 tablet (10 mg total) by mouth every 6 (six) hours as needed for nausea or vomiting. 90 tablet 2  ? simethicone (MYLICON) 163 MG chewable tablet Chew 125 mg by mouth every 6 (six) hours as needed for flatulence.    ? sodium chloride (OCEAN) 0.65 % SOLN nasal spray Place 2 sprays into both nostrils as needed for congestion (Try this before Afrin).  0  ? Sodium Chloride Flush (NORMAL SALINE FLUSH) 0.9 % SOLN Inject 5 mLs by Intracatheter route daily for 20 days. 200 mL 0  ? Sodium Chloride Flush (NORMAL SALINE FLUSH) 0.9 % SOLN Use 5 mLs by Intracatheter route every 8 hours. 450 mL 0  ? tamsulosin (FLOMAX) 0.4 MG CAPS capsule Take 1 capsule (0.4 mg total) by mouth daily after supper. 30 capsule 0  ? Wheat Dextrin (BENEFIBER DRINK MIX) PACK Take 1 Package by mouth daily at 6 (six) AM.    ? ?No current facility-administered medications for this visit.  ? ? ?SURGICAL HISTORY:  ?Past Surgical History:  ?Procedure Laterality Date  ? BIOPSY  01/26/2021  ? Procedure: BIOPSY;  Surgeon: Ronnette Juniper, MD;  Location: [facility] ENDOSCOPY;  Service: Gastroenterology;;  ? BRONCHIAL BIOPSY  09/14/2020  ? Procedure: BRONCHIAL BIOPSIES;  Surgeon: Collene Gobble, MD;  Location[facility] ENDOSCOPY;  Service: Cardiopulmonary;;  ? BRONCHIAL BRUSHINGS  09/14/2020  ? Procedure: BRONCHIAL BRUSHINGS;  Surgeon: Collene Gobble, MD;  Location[facility] ENDOSCOPY;  Service: Cardiopulmonary;;  ? BRONCHIAL NEEDLE ASPIRATION BIOPSY  09/14/2020  ? Procedure: BRONCHIAL NEEDLE ASPIRATION BIOPSIES;  Surgeon: Collene Gobble, MD;  Location: [facility] ENDOSCOPY;  Service: Cardiopulmonary;;  ? CARDIAC CATHETERIZATION    ? CARPAL TUNNEL RELEASE    ? right  ? COLONOSCOPY WITH PROPOFOL N/A 01/26/2021  ? Procedure: COLONOSCOPY WITH PROPOFOL;  Surgeon: Ronnette Juniper, MD;  Location: [facility] ENDOSCOPY;  Service: Gastroenterology;  Laterality: N/A;  ? FIDUCIAL MARKER PLACEMENT  09/14/2020  ? Procedure: FIDUCIAL MARKER PLACEMENT;  Surgeon:  Collene Gobble, MD;  Location: [facility] ENDOSCOPY;  Service: Cardiopulmonary;;  ? HAND SURGERY    ? left  ? KNEE SURGERY    ? left  ? VIDEO BRONCHOSCOPY N/A 09/14/2020  ? Procedure: ROBOTIC VIDEO BRONCHOSCOPY WITH FLUORO;  Surgeon: Collene Gobble, MD;  Location: [facility] ENDOSCOPY;  Service: Cardiopulmonary;  Laterality: N/A;  ? VIDEO BRONCHOSCOPY WITH ENDOBRONCHIAL ULTRASOUND N/A 09/14/2020  ? Procedure: VIDEO BRONCHOSCOPY WITH ENDOBRONCHIAL ULTRASOUND;  Surgeon: Collene Gobble, MD;  Location: [facility] ENDOSCOPY;  Service: Cardiopulmonary;  Laterality: N/A;  ? VIDEO BRONCHOSCOPY WITH RADIAL ENDOBRONCHIAL ULTRASOUND  09/14/2020  ? Procedure: VIDEO BRONCHOSCOPY WITH RADIAL ENDOBRONCHIAL ULTRASOUND;  Surgeon: Collene Gobble, MD;  Location: [facility] ENDOSCOPY;  Service: Cardiopulmonary;;  ? ? ?  REVIEW OF SYSTEMS:  Constitutional: positive for fatigue ?Eyes: negative ?Ears, nose, mouth, throat, and face: negative ?Respiratory: positive for dyspnea on exertion ?Cardiovascular: negative ?Gastrointestinal: negative ?Genitourinary:negative ?Integument/breast: negative ?Hematologic/lymphatic: negative ?Musculoskeletal:negative ?Neurological: negative ?Behavioral/Psych: negative ?Endocrine: negative ?Allergic/Immunologic: negative  ? ?PHYSICAL EXAMINATION: General appearance: alert, cooperative, fatigued, and no distress ?Head: Normocephalic, without obvious abnormality, atraumatic ?Neck: no adenopathy, no JVD, supple, symmetrical, trachea midline, and thyroid not enlarged, symmetric, no tenderness/mass/nodules ?Lymph nodes: Cervical, supraclavicular, and axillary nodes normal. ?Resp: clear to auscultation bilaterally ?Back: symmetric, no curvature. ROM normal. No CVA tenderness. ?Cardio: regular rate and rhythm, S1, S2 normal, no murmur, click, rub or gallop ?GI: soft, non-tender; bowel sounds normal; no masses,  no organomegaly ?Extremities: extremities normal, atraumatic, no cyanosis or edema ?Neurologic: Alert and oriented X 3, normal strength  and tone. Normal symmetric reflexes. Normal coordination and gait ? ?ECOG PERFORMANCE STATUS: 1 - Symptomatic but completely ambulatory ? ?Blood pressure (!) 143/90, pulse (!) 118, temperature (!) 97.5 ?F (

## 2021-03-16 NOTE — Progress Notes (Signed)
? ?  ?Palliative Medicine ?[place]  ?[phone]) [phone] [phone] ? ? ?Name: Raymond [name] ?Date: 03/16/2021 ?MRN: [id number]  ?DOB: January 28, 1960 ? ?Patient Care Team: ?Pcp, No as PCP - General ?Raymond Hart, RN as Oncology Nurse Navigator (Oncology)  ? ? ?REASON FOR CONSULTATION: ?Raymond [name] is a 61 y.o. male with medical history including stage IV non-small cell lung cancer, adenocarcinoma of the left lower lobe, mediastinal lymphadenopathy with brain metastasis, s/p 5 cycles of carboplatin, Keytruda, and Alimta, stereotactic radiosurgery to brain mets, hypertension, and GERD.  Recent MRI on (01/21/21) showed significant increase in enhancing metastatic cerebral and cerebellar hemisphere lesions. Now undergoing a total of 10 whole brain radiation treatments. Palliative ask to see for ongoing symptom management support and goals of care.  ? ? ?SOCIAL HISTORY:    ? reports that he has been smoking cigarettes. He has never used smokeless tobacco. He reports that he does not drink alcohol and does not use drugs. ? ?ADVANCE DIRECTIVES:  ?None on file. Education provided. Raymond [name] aware his wife, Raymond [name] is his medical decision maker in the setting of no document. He was given an advanced directive packet with plans to complete.  ? ?CODE STATUS: Full code ? ? ?ALLERGIES:  is allergic to benadryl [diphenhydramine]. ? ?MEDICATIONS:  ?Current Outpatient Medications  ?Medication Sig Dispense Refill  ? fluticasone (FLONASE) 50 MCG/ACT nasal spray Place 1 spray into both nostrils daily. 16 g 2  ? acetaminophen (TYLENOL) 650 MG CR tablet Take 650 mg by mouth every 8 (eight) hours as needed for pain.    ? amLODipine (NORVASC) 10 MG tablet Take 1 tablet by mouth daily. 30 tablet 1  ? amoxicillin-clavulanate (AUGMENTIN) 875-125 MG tablet Take 1 tablet by mouth 2 times daily for 14 days. 28 tablet 0  ? dexamethasone (DECADRON) 4 MG tablet Please take 2 tablets 2 times a day the day before,  the day of, and the day after treatment. 40 tablet 2  ? Ensure Plus (ENSURE PLUS) LIQD Take 237 mLs by mouth 3 (three) times daily between meals.    ? folic acid (FOLVITE) 1 MG tablet Take 1 tablet (1 mg total) by mouth daily. 30 tablet 1  ? gabapentin (NEURONTIN) 300 MG capsule Take 1 capsule (300 mg total) by mouth at bedtime. 30 capsule 0  ? guaiFENesin (MUCINEX) 600 MG 12 hr tablet Take 600 mg by mouth 2 (two) times daily.    ? Homeopathic Products (ZICAM ALLERGY RELIEF NA) Place 2 sprays into the nose daily. (Patient not taking: Reported on 03/16/2021)    ? memantine (NAMENDA) 10 MG tablet Take 1 tablet (10 mg total) by mouth 2 (two) times daily. 60 tablet 4  ? mirtazapine (REMERON) 30 MG tablet Take 1 tablet (30 mg total) by mouth at bedtime. 30 tablet 0  ? Misc Natural Products (OSTEO BI-FLEX ADV JOINT SHIELD) TABS Take 1 tablet by mouth daily. (Patient not taking: Reported on 03/16/2021)    ? Multiple Vitamin (MULTIVITAMIN WITH MINERALS) TABS tablet Take 1 tablet by mouth daily.    ? ondansetron (ZOFRAN) 8 MG tablet Take 1 tablet (8 mg total) by mouth every 8 (eight) hours as needed for nausea or vomiting. 60 tablet 2  ? oxyCODONE (OXY IR/ROXICODONE) 5 MG immediate release tablet Take 1 tablet (5 mg total) by mouth every 6 (six) hours as needed for severe pain. 60 tablet 0  ? prochlorperazine (COMPAZINE) 10 MG tablet Take 1 tablet (10 mg total)  by mouth every 6 (six) hours as needed for nausea or vomiting. 90 tablet 2  ? simethicone (MYLICON) 885 MG chewable tablet Chew 125 mg by mouth every 6 (six) hours as needed for flatulence. (Patient not taking: Reported on 03/16/2021)    ? sodium chloride (OCEAN) 0.65 % SOLN nasal spray Place 2 sprays into both nostrils as needed for congestion (Try this before Afrin). (Patient not taking: Reported on 03/16/2021)  0  ? Sodium Chloride Flush (NORMAL SALINE FLUSH) 0.9 % SOLN Use 5 mLs by Intracatheter route every 8 hours. 450 mL 0  ? Sodium Chloride Flush (NORMAL SALINE  FLUSH) 0.9 % SOLN Inject 5 mLs by Intracatheter route daily for 20 days. 200 mL 0  ? tamsulosin (FLOMAX) 0.4 MG CAPS capsule Take 1 capsule (0.4 mg total) by mouth daily after supper. 30 capsule 0  ? Wheat Dextrin (BENEFIBER DRINK MIX) PACK Take 1 Package by mouth daily at 6 (six) AM.    ? ?No current facility-administered medications for this visit.  ? ? ?VITAL SIGNS: ?There were no vitals taken for this visit. ?There were no vitals filed for this visit. ? ?  ?Estimated body mass index is 34.76 kg/m? as calculated from the following: ?  Height as of an earlier encounter on 03/16/21: 5\' 11"  (1.803 m). ?  Weight as of an earlier encounter on 03/16/21: 249 lb 3.2 oz (113 kg). ? ? ?PERFORMANCE STATUS (ECOG) : 1 - Symptomatic but completely ambulatory ? ?Physical Exam ?General: NAD, ambulatory, weak appearing ?Cardiovascular: RRR ?Pulmonary: normal breathing pattern  ?Extremities: no edema, moves all extremity  ?Neurological: AAO x4, mood appropriate, tearful  ? ?IMPRESSION: ? ?Raymond [name] presents today for follow-up. His wife is also with him for support.  Weak appearing. He was recently hospitalized with LLQ pain, nausea, vomiting, and fever. After work-up found to have diverticulitis with abscess. He received IV antibiotics and abscess drain placed (3/2).  ? ?Pain related to neoplasm  ?[name] reports pain is manageable. Is tolerating Oxy IR. We will send in a refill. He is trying to tolerate and take as little as possible throughout the day as he is goal is to remain as functional as possible. He has used several times in the day since discharging from hospital.  ? ?We will continue to closely monitor and make needed adjustments.  ? ?Constipation  ?Improved with daily Miralax use. We discussed the importance of bowel regimen to prevent further complications and in addition to the use of opioids. He understands if no bowel movement in 24 hours to increase to twice daily. Encouraged water intake.  ? ?Abscess drain in  place. Drain flushed and dressing changed. Patient also given some supplies to change dressing reporting he did not have any at home. Site normal no inflammation or pain.  ? ?Weight loss/decreased appetite  ?Mahari and his wife expressed ongoing concerns with decreased appetite. She is pushing him to eat more as she knows this will be important in his overall outcome. Elisha expresses he is trying and he feels his appetite is improved with mirtazapine but does endorse a decrease again since being hospitalized. He was able to eat a good meal over the weekend. Discussed need for continued Ensure daily. Will continue to closely monitor.  ? ?Weight on today is 249lbs down from 267 pounds 2/23, 2/21, 2/13 268.4 lbs, 2/6 to 274 pounds.  ? ?4. Goals of Care   ? ?He has not worked in several weeks which he is emotional  about. Is concerned about not having approved FMLA or disability. This is understandable from a financial aspect. Emotional support provided and we discussed process for FMLA paperwork and legal parameters.  ? ?We discussed his ongoing disease and trajectory.  Gorden continues to remain hopeful for some stability. He expresses he wants to return to work. Per. Dr. Julien Nordmann his chemo is being delayed by a week to allow for some improvement. He is tearful in verbalizing his feelings about his health and the recent hospitalization.  ? ?Keona is remaining hopeful with realistic understanding of his condition and severity. Speaks to his strong Christian faith.  ? ?I discussed the importance of continued conversation with family and their medical providers regarding overall plan of care and treatment options, ensuring decisions are within the context of the patients values and GOCs. ? ?PLAN: ?Oxy IR 5 mg every 6 hours as needed for pain. Will continue to closely evaluate. Is taking throughout the day as needed.  ?Neurontin 300 mg nightly, will continue to evaluate and increase dose for added support.  ?Continues to  follow with Dr. Nelida Meuse for ongoing support.  ?Ongoing goals of care discussions.  ?I will plan to see patient back in 2-3 weeks in collaboration to other oncology appointments.  ? ? ?Patient expressed un

## 2021-03-16 NOTE — Telephone Encounter (Signed)
Call received from the [facility].  ? ?Patient is s/p LLQ intraabdominal drain placement by IR on 03/04/21, patient was discharged home on 3/7 with the drain in place.  ? ?Patient was seen by Dr. Julien Nordmann today, and he stated that the drain is not putting out much. He denied fever, chills, N/V and abdominal pain.  ?Labs showed worsening of WBC, patient was tachycardic.  ? ?IR was contacted to set up drain follow up appointment.  ?Patient is scheduled for CT AP w followed by possible drain injection and possible exchange on this Thursday 03/18/21.  ? ?Patient was called and informed about the appointment by [facility] IR scheduler.  ?Please call IR for further questions and concerns.  ? ? ?Tera Mater PA-C ?03/16/2021 3:51 PM ? ? ? ?

## 2021-03-16 NOTE — Progress Notes (Signed)
Raymond [name] wife requested drain dressing to be changed. Cleaned area with NS. Incision is clean, dry, and intact. Placed fenestrated gauze around site. Taped dressing.  ?

## 2021-03-16 NOTE — Progress Notes (Signed)
Oncology Nurse Navigator Documentation ? ?Oncology Nurse Navigator Flowsheets 03/16/2021 11/23/2020  ?Abnormal Finding Date - 09/01/2020  ?Confirmed Diagnosis Date - 09/14/2020  ?Diagnosis Status - Confirmed Diagnosis Complete  ?Planned Course of Treatment - Chemotherapy  ?Phase of Treatment - Radiation  ?Chemotherapy Actual Start Date: - 10/05/2020  ?Radiation Actual Start Date: - 10/13/2020  ?Radiation Actual End Date: - 10/21/2020  ?Navigator Follow Up Date: 03/19/2021 11/25/2020  ?Navigator Follow Up Reason: Appointment Review Other:  ?Navigator Location [place]  ?Navigator Encounter Type Clinic/MDC Clinic/MDC  ?Treatment Initiated Date - 10/05/2020  ?Patient Visit Type Follow-up Follow-up  ?Treatment Phase Treatment Treatment  ?Barriers/Navigation Needs Coordination of Care/spoke to Mr. Carew today.  Very nice patient.  His tx is delayed today until next week. Patient needs to be seen with IR to check on his abdominal drain. I called radiology and arranged. They will call patient with an appt either Thursday or Friday this week. Order completed and Dr. Julien Nordmann updated.  Education;Healthcare System Knowledge Deficit;No Insurance  ?Education - Other;Concerns with Insurance Coverage  ?Interventions Coordination of Care;Psycho-Social Support Education;Psycho-Social Support;Referrals  ?Acuity Level 3-Moderate Needs (3-4 Barriers Identified) Level 2-Minimal Needs (1-2 Barriers Identified)  ?Referrals Other Other  ?Coordination of Care Other -  ?Education Method - Verbal;Written  ?Time Spent with Patient 45 30  ?  ?

## 2021-03-17 ENCOUNTER — Ambulatory Visit: Payer: 59

## 2021-03-17 ENCOUNTER — Telehealth: Payer: Self-pay | Admitting: Physician Assistant

## 2021-03-17 ENCOUNTER — Other Ambulatory Visit: Payer: Self-pay | Admitting: Radiation Therapy

## 2021-03-17 NOTE — Telephone Encounter (Signed)
Scheduled per 03/14 los, patient has been called and notified. Spoke with wife. ?

## 2021-03-18 ENCOUNTER — Other Ambulatory Visit: Payer: Self-pay | Admitting: Internal Medicine

## 2021-03-18 ENCOUNTER — Ambulatory Visit: Payer: 59

## 2021-03-18 ENCOUNTER — Encounter (HOSPITAL_COMMUNITY): Payer: Self-pay

## 2021-03-18 ENCOUNTER — Ambulatory Visit (HOSPITAL_COMMUNITY)
Admission: RE | Admit: 2021-03-18 | Discharge: 2021-03-18 | Disposition: A | Discharge status: Home / Self Care | Payer: Medicaid Other | Source: Ambulatory Visit | Attending: Internal Medicine | Admitting: Internal Medicine

## 2021-03-18 ENCOUNTER — Other Ambulatory Visit: Payer: Self-pay

## 2021-03-18 ENCOUNTER — Ambulatory Visit (HOSPITAL_COMMUNITY)
Admission: RE | Admit: 2021-03-18 | Discharge: 2021-03-18 | Disposition: A | Discharge status: Home / Self Care | Payer: Medicaid Other | Source: Ambulatory Visit | Attending: Student | Admitting: Student

## 2021-03-18 DIAGNOSIS — K769 Liver disease, unspecified: Secondary | ICD-10-CM | POA: Insufficient documentation

## 2021-03-18 DIAGNOSIS — R911 Solitary pulmonary nodule: Secondary | ICD-10-CM | POA: Diagnosis not present

## 2021-03-18 DIAGNOSIS — I700 Atherosclerosis of aorta: Secondary | ICD-10-CM | POA: Insufficient documentation

## 2021-03-18 DIAGNOSIS — K5720 Diverticulitis of large intestine with perforation and abscess without bleeding: Secondary | ICD-10-CM | POA: Diagnosis not present

## 2021-03-18 DIAGNOSIS — Y848 Other medical procedures as the cause of abnormal reaction of the patient, or of later complication, without mention of misadventure at the time of the procedure: Secondary | ICD-10-CM | POA: Insufficient documentation

## 2021-03-18 DIAGNOSIS — T85590A Other mechanical complication of bile duct prosthesis, initial encounter: Secondary | ICD-10-CM | POA: Diagnosis present

## 2021-03-18 HISTORY — PX: IR FLUORO RM 30-60 MIN: IMG2384

## 2021-03-18 MED ORDER — SODIUM CHLORIDE (PF) 0.9 % IJ SOLN
INTRAMUSCULAR | Status: AC
  Filled 2021-03-18: qty 50

## 2021-03-18 MED ORDER — IOHEXOL 300 MG/ML  SOLN
100.0000 mL | Freq: Once | INTRAMUSCULAR | Status: AC | PRN
  Administered 2021-03-18: 100 mL via INTRAVENOUS

## 2021-03-19 ENCOUNTER — Ambulatory Visit: Payer: 59 | Admitting: Psychologist

## 2021-03-19 ENCOUNTER — Other Ambulatory Visit (HOSPITAL_COMMUNITY): Payer: Self-pay | Admitting: Interventional Radiology

## 2021-03-19 ENCOUNTER — Encounter: Payer: Self-pay | Admitting: Licensed Clinical Social Worker

## 2021-03-19 ENCOUNTER — Other Ambulatory Visit: Payer: Self-pay | Admitting: Internal Medicine

## 2021-03-19 ENCOUNTER — Ambulatory Visit (HOSPITAL_COMMUNITY)
Admission: RE | Admit: 2021-03-19 | Discharge: 2021-03-19 | Disposition: A | Discharge status: Home / Self Care | Payer: 59 | Source: Ambulatory Visit | Attending: Interventional Radiology | Admitting: Interventional Radiology

## 2021-03-19 DIAGNOSIS — Z8719 Personal history of other diseases of the digestive system: Secondary | ICD-10-CM

## 2021-03-19 DIAGNOSIS — K5720 Diverticulitis of large intestine with perforation and abscess without bleeding: Secondary | ICD-10-CM

## 2021-03-19 DIAGNOSIS — K5730 Diverticulosis of large intestine without perforation or abscess without bleeding: Secondary | ICD-10-CM | POA: Insufficient documentation

## 2021-03-19 DIAGNOSIS — Z4803 Encounter for change or removal of drains: Secondary | ICD-10-CM | POA: Insufficient documentation

## 2021-03-19 HISTORY — PX: IR CATHETER TUBE CHANGE: IMG717

## 2021-03-19 MED ORDER — LIDOCAINE HCL 1 % IJ SOLN
INTRAMUSCULAR | Status: AC
  Filled 2021-03-19: qty 20

## 2021-03-19 NOTE — Progress Notes (Addendum)
Patient ID: Raymond [name], male   DOB: 07-21-1960, 61 y.o.   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  Patient called today reporting leakage from drain/broken catheter hub.  He was instructed to return to IR for drain exchange today.  Drain exam today does reveal evidence of a broken/detached hub.  Details/risks of procedure discussed with patient with his apparent understanding and consent.  As outpatient, he was instructed to flush drain daily with 5 cc sterile saline, record output and change dressing as needed.  He will be scheduled for follow-up drain injection in the IR clinic in 2-3.  He was told to contact our service with any additional questions or concerns. ?

## 2021-03-19 NOTE — Procedures (Signed)
Pre procedural Dx: Fractured percutaneous drain. ?Post procedural Dx: Same ? ?Technically successful fluoro guided exchange of 12 Fr drainage catheter. ?Drainage catheter connected to gravity bag.   ? ?EBL: Trace ?Complications: None immediate ? ?Ronny Bacon, MD ?Pager #: [phone] ? ? ?

## 2021-03-19 NOTE — Progress Notes (Signed)
Raymond [name] CSW Progress Note ? ?Clinical Social Worker contacted patient by phone to answer questions regarding disability paperwork received.  Pt informed Probation officer he was assigned a case manager Jarold Motto [phone] [email or web address]) whom he was able to speak to.  Pt's application delayed as there were questions regarding records showing pt owned a number of cars he no longer owns.  Pt states he is submitting the requested letter.  CSW inquired if pt informed CM of the change in his physical condition as he had been working when the application was filed.  Pt informed Probation officer he had not and requested Diplomatic Services operational officer on his behalf as he needed to return to the hospital for a complication with his tubing from yesterday's procedure.  CSW attempted to contact case manager by phone.  Phone not set up w/ voicemail.  CSW sent an e-mail to case manager informing of the above, awaiting response. ? ? ? ?Raymond [name] , LCSW ?

## 2021-03-22 ENCOUNTER — Other Ambulatory Visit: Payer: 59

## 2021-03-22 NOTE — Progress Notes (Signed)
?  Radiation Oncology         [phone] ?________________________________ ? ?Name: Raymond [name] MRN: [id number]  ?Date of Service: 03/15/2021  DOB: 10/27/1960 ? ?Post Treatment Telephone Note ? ?Diagnosis:   Stage IV, cT2bN2M1c, NSCLC, adenocarcinoma of the left lower lobe ? ?Intent: Palliative ? ?Radiation Treatment Dates: 10/21/2020 through 02/10/2021 ?Site Technique Total Dose (Gy) Dose per Fx (Gy) Completed Fx Beam Energies  ?Brain: Brain IMRT 20/20 20 1/1 6XFFF  ?Brain: Brain Complex 30/30 3 10/10 6X  ? ?Narrative: The patient tolerated radiation therapy relatively well. He developed fatigue and also noted some mild headaches during therapy. He has been dealing with diverticulitis and abscess and has another drain placed by IR a few weeks ago. He is not sure if he is supposed to come tomorrow for his treatment with Dr. Julien Nordmann. ? ? ?Impression/Plan: ?1. Stage IV, cT2bN2M1c, NSCLC, adenocarcinoma of the left lower lobe. The patient has been doing well since completion of radiotherapy. We will be followed in the brain and spine oncology conference and he will see Dr. Mickeal Skinner  in early May 2023. He will continue follow up with Dr. Julien Nordmann as well.  ? ? ? ?Carola Rhine, PAC  ? ? ? ? ?

## 2021-03-23 ENCOUNTER — Other Ambulatory Visit (HOSPITAL_COMMUNITY): Payer: Self-pay

## 2021-03-23 ENCOUNTER — Telehealth: Payer: Self-pay | Admitting: Internal Medicine

## 2021-03-23 ENCOUNTER — Inpatient Hospital Stay: Payer: Medicaid Other

## 2021-03-23 ENCOUNTER — Encounter: Payer: Self-pay | Admitting: Nurse Practitioner

## 2021-03-23 ENCOUNTER — Inpatient Hospital Stay (HOSPITAL_BASED_OUTPATIENT_CLINIC_OR_DEPARTMENT_OTHER): Payer: Medicaid Other | Admitting: Nurse Practitioner

## 2021-03-23 ENCOUNTER — Inpatient Hospital Stay (HOSPITAL_BASED_OUTPATIENT_CLINIC_OR_DEPARTMENT_OTHER): Payer: Medicaid Other | Admitting: Internal Medicine

## 2021-03-23 ENCOUNTER — Encounter: Payer: Self-pay | Admitting: Internal Medicine

## 2021-03-23 ENCOUNTER — Other Ambulatory Visit: Payer: Self-pay

## 2021-03-23 ENCOUNTER — Encounter: Payer: Self-pay | Admitting: Licensed Clinical Social Worker

## 2021-03-23 VITALS — BP 129/81 | HR 93 | Temp 97.1°F | Resp 20 | Ht 71.0 in | Wt 250.3 lb

## 2021-03-23 DIAGNOSIS — Z515 Encounter for palliative care: Secondary | ICD-10-CM

## 2021-03-23 DIAGNOSIS — C3492 Malignant neoplasm of unspecified part of left bronchus or lung: Secondary | ICD-10-CM

## 2021-03-23 DIAGNOSIS — G893 Neoplasm related pain (acute) (chronic): Secondary | ICD-10-CM

## 2021-03-23 DIAGNOSIS — C3412 Malignant neoplasm of upper lobe, left bronchus or lung: Secondary | ICD-10-CM

## 2021-03-23 DIAGNOSIS — R530 Neoplastic (malignant) related fatigue: Secondary | ICD-10-CM

## 2021-03-23 DIAGNOSIS — Z5111 Encounter for antineoplastic chemotherapy: Secondary | ICD-10-CM | POA: Diagnosis not present

## 2021-03-23 DIAGNOSIS — Z7189 Other specified counseling: Secondary | ICD-10-CM

## 2021-03-23 DIAGNOSIS — R630 Anorexia: Secondary | ICD-10-CM

## 2021-03-23 DIAGNOSIS — C7931 Secondary malignant neoplasm of brain: Secondary | ICD-10-CM | POA: Diagnosis not present

## 2021-03-23 LAB — CBC WITH DIFFERENTIAL (CANCER CENTER ONLY)
Abs Immature Granulocytes: 0.05 10*3/uL (ref 0.00–0.07)
Basophils Absolute: 0.1 10*3/uL (ref 0.0–0.1)
Basophils Relative: 1 %
Eosinophils Absolute: 0.1 10*3/uL (ref 0.0–0.5)
Eosinophils Relative: 2 %
HCT: 39.2 % (ref 39.0–52.0)
Hemoglobin: 12.9 g/dL — ABNORMAL LOW (ref 13.0–17.0)
Immature Granulocytes: 1 %
Lymphocytes Relative: 17 %
Lymphs Abs: 1.5 10*3/uL (ref 0.7–4.0)
MCH: 30.3 pg (ref 26.0–34.0)
MCHC: 32.9 g/dL (ref 30.0–36.0)
MCV: 92 fL (ref 80.0–100.0)
Monocytes Absolute: 0.8 10*3/uL (ref 0.1–1.0)
Monocytes Relative: 9 %
Neutro Abs: 6.3 10*3/uL (ref 1.7–7.7)
Neutrophils Relative %: 70 %
Platelet Count: 254 10*3/uL (ref 150–400)
RBC: 4.26 MIL/uL (ref 4.22–5.81)
RDW: 14.1 % (ref 11.5–15.5)
WBC Count: 8.9 10*3/uL (ref 4.0–10.5)
nRBC: 0 % (ref 0.0–0.2)

## 2021-03-23 LAB — CMP (CANCER CENTER ONLY)
ALT: 14 U/L (ref 0–44)
AST: 16 U/L (ref 15–41)
Albumin: 3.6 g/dL (ref 3.5–5.0)
Alkaline Phosphatase: 112 U/L (ref 38–126)
Anion gap: 7 (ref 5–15)
BUN: 17 mg/dL (ref 6–20)
CO2: 26 mmol/L (ref 22–32)
Calcium: 9.4 mg/dL (ref 8.9–10.3)
Chloride: 107 mmol/L (ref 98–111)
Creatinine: 0.72 mg/dL (ref 0.61–1.24)
GFR, Estimated: >60 mL/min (ref >60)
Glucose, Bld: 106 mg/dL — ABNORMAL HIGH (ref 70–99)
Potassium: 3.8 mmol/L (ref 3.5–5.1)
Sodium: 140 mmol/L (ref 135–145)
Total Bilirubin: 0.4 mg/dL (ref 0.3–1.2)
Total Protein: 7 g/dL (ref 6.5–8.1)

## 2021-03-23 MED ORDER — GABAPENTIN 300 MG PO CAPS
300.0000 mg | ORAL_CAPSULE | Freq: Every day | ORAL | 0 refills | Status: DC
  Filled 2021-03-23: qty 30, 30d supply, fill #0

## 2021-03-23 MED ORDER — SODIUM CHLORIDE 0.9 % IV SOLN
75.0000 mg/m2 | Freq: Once | INTRAVENOUS | Status: AC
  Administered 2021-03-23: 190 mg via INTRAVENOUS
  Filled 2021-03-23: qty 19

## 2021-03-23 MED ORDER — SODIUM CHLORIDE 0.9 % IV SOLN: Freq: Once | INTRAVENOUS | Status: AC

## 2021-03-23 MED ORDER — DIPHENHYDRAMINE HCL 50 MG/ML IJ SOLN
50.0000 mg | Freq: Once | INTRAMUSCULAR | Status: AC
  Administered 2021-03-23: 50 mg via INTRAVENOUS
  Filled 2021-03-23: qty 1

## 2021-03-23 MED ORDER — ACETAMINOPHEN 325 MG PO TABS
650.0000 mg | ORAL_TABLET | Freq: Once | ORAL | Status: AC
  Administered 2021-03-23: 650 mg via ORAL
  Filled 2021-03-23: qty 2

## 2021-03-23 MED ORDER — SODIUM CHLORIDE 0.9 % IV SOLN
10.0000 mg | Freq: Once | INTRAVENOUS | Status: AC
  Administered 2021-03-23: 10 mg via INTRAVENOUS
  Filled 2021-03-23: qty 1
  Filled 2021-03-23: qty 10

## 2021-03-23 NOTE — Progress Notes (Signed)
[place] CSW Progress Note ? ?Clinical Social Worker met with patient to discuss disability and Medicaid applications.  CSW called case worker from the [place] to answer necessary questions to complete pt's Medicaid application.  Per case worker, pt should anticipate an answer in approximately 2 weeks.  Pt provided Probation officer with the telephone number to call for social security disability to check on the application which was submitted.  CSW provided pt w/ 2 [employer] fund gift cards and completed a Lung Cancer Initiative Application with patient as they have additional funding available.  CSW also provided pt w/ contact information for the financial counselor to assess if he may be eligible for a grant.  CSW to meet w/ pt later this week to follow up. ? ? ? ?Henriette Combs , LCSW ?

## 2021-03-23 NOTE — Telephone Encounter (Signed)
.  Called patient to schedule appointment per 3/21 inbasket, patient is aware of date and time.   ?

## 2021-03-23 NOTE — Patient Instructions (Signed)
[facility] MEDICAL ONCOLOGY   ?Discharge Instructions: ?Thank you for choosing [facility] to provide your oncology and hematology care.  ? ?If you have a lab appointment with the Cancer Center, please go directly to the Cancer Center and check in at the registration area. ?  ?Wear comfortable clothing and clothing appropriate for easy access to any Portacath or PICC line.  ? ?We strive to give you quality time with your provider. You may need to reschedule your appointment if you arrive late (15 or more minutes).  Arriving late affects you and other patients whose appointments are after yours.  Also, if you miss three or more appointments without notifying the office, you may be dismissed from the clinic at the provider?s discretion.    ?  ?For prescription refill requests, have your pharmacy contact our office and allow 72 hours for refills to be completed.   ? ?Today you received the following chemotherapy and/or immunotherapy agents: docetaxel    ?  ?To help prevent nausea and vomiting after your treatment, we encourage you to take your nausea medication as directed. ? ?BELOW ARE SYMPTOMS THAT SHOULD BE REPORTED IMMEDIATELY: ?*FEVER GREATER THAN 100.4 F (38 ?C) OR HIGHER ?*CHILLS OR SWEATING ?*NAUSEA AND VOMITING THAT IS NOT CONTROLLED WITH YOUR NAUSEA MEDICATION ?*UNUSUAL SHORTNESS OF BREATH ?*UNUSUAL BRUISING OR BLEEDING ?*URINARY PROBLEMS (pain or burning when urinating, or frequent urination) ?*BOWEL PROBLEMS (unusual diarrhea, constipation, pain near the anus) ?TENDERNESS IN MOUTH AND THROAT WITH OR WITHOUT PRESENCE OF ULCERS (sore throat, sores in mouth, or a toothache) ?UNUSUAL RASH, SWELLING OR PAIN  ?UNUSUAL VAGINAL DISCHARGE OR ITCHING  ? ?Items with * indicate a potential emergency and should be followed up as soon as possible or go to the Emergency Department if any problems should occur. ? ?Please show the CHEMOTHERAPY ALERT CARD or IMMUNOTHERAPY ALERT CARD at check-in  to the Emergency Department and triage nurse. ? ?Should you have questions after your visit or need to cancel or reschedule your appointment, please contact [facility] MEDICAL ONCOLOG[facility]  Dept: [phone]  and follow the prompts.  Office hours are 8:00 a.m. to 4:30 p.m. Monday - Friday. Please note that voicemails left after 4:00 p.m. may not be returned until the following business day.  We are closed weekends and major holidays. You have access to a nurse at all times for urgent questions. Please call the main number to the clinic Dept: [phone] and follow the prompts. ? ? ?For any non-urgent questions, you may also contact your provider using MyChart. We now offer e-Visits for anyone 18 and older to request care online for non-urgent symptoms. For details visit [email or web address]. ?  ?Also download the MyChart app! Go to the app store, search "MyChart", open the app, select [facility], and log in with your MyChart username and password. ? ?Due to Covid, a mask is required upon entering the hospital/clinic. If you do not have a mask, one will be given to you upon arrival. For doctor visits, patients may have 1 support person aged 18 or older with them. For treatment visits, patients cannot have anyone with them due to current Covid guidelines and our immunocompromised population.  ? ?

## 2021-03-23 NOTE — Progress Notes (Signed)
? ?  ?Palliative Medicine ?[facility]  ?Telephone:([phone] [phone] ? ? ?Name: Raymond [name] ?Date: 03/23/2021 ?MRN: [id number]  ?DOB: [birth year] ? ?Patient Care Team: ?Pcp, No as PCP - General ?Raymond Hart, RN as Oncology Nurse Navigator (Oncology)  ? ? ?REASON FOR CONSULTATION: ?Raymond [name] is a 61 y.o. male with medical history including stage IV non-small cell lung cancer, adenocarcinoma of the left lower lobe, mediastinal lymphadenopathy with brain metastasis, s/p 5 cycles of carboplatin, Keytruda, and Alimta, stereotactic radiosurgery to brain mets, hypertension, and GERD.  Recent MRI on (01/21/21) showed significant increase in enhancing metastatic cerebral and cerebellar hemisphere lesions. Now undergoing a total of 10 whole brain radiation treatments. Palliative ask to see for ongoing symptom management support and goals of care.  ? ? ?SOCIAL HISTORY:    ? reports that he has been smoking cigarettes. He has never used smokeless tobacco. He reports that he does not drink alcohol and does not use drugs. ? ?ADVANCE DIRECTIVES:  ?None on file. Education provided. Raymond [name] aware his wife, Raymond [name] is his medical decision maker in the setting of no document. He was given an advanced directive packet with plans to complete.  ? ?CODE STATUS: Full code ? ? ?ALLERGIES:  is allergic to benadryl [diphenhydramine]. ? ?MEDICATIONS:  ?Current Outpatient Medications  ?Medication Sig Dispense Refill  ? acetaminophen (TYLENOL) 650 MG CR tablet Take 650 mg by mouth every 8 (eight) hours as needed for pain.    ? amLODipine (NORVASC) 10 MG tablet Take 1 tablet by mouth daily. 30 tablet 1  ? dexamethasone (DECADRON) 4 MG tablet Please take 2 tablets 2 times a day the day before, the day of, and the day after treatment. 40 tablet 2  ? Ensure Plus (ENSURE PLUS) LIQD Take 237 mLs by mouth 3 (three) times daily between meals.    ? fluticasone (FLONASE) 50 MCG/ACT nasal spray Place 1 spray into  both nostrils daily. 16 g 2  ? folic acid (FOLVITE) 1 MG tablet Take 1 tablet (1 mg total) by mouth daily. 30 tablet 1  ? gabapentin (NEURONTIN) 300 MG capsule Take 1 capsule (300 mg total) by mouth at bedtime. 30 capsule 0  ? guaiFENesin (MUCINEX) 600 MG 12 hr tablet Take 600 mg by mouth 2 (two) times daily.    ? Homeopathic Products (ZICAM ALLERGY RELIEF NA) Place 2 sprays into the nose daily. (Patient not taking: Reported on 03/16/2021)    ? memantine (NAMENDA) 10 MG tablet Take 1 tablet (10 mg total) by mouth 2 (two) times daily. 60 tablet 4  ? mirtazapine (REMERON) 30 MG tablet Take 1 tablet (30 mg total) by mouth at bedtime. 30 tablet 0  ? Misc Natural Products (OSTEO BI-FLEX ADV JOINT SHIELD) TABS Take 1 tablet by mouth daily. (Patient not taking: Reported on 03/16/2021)    ? Multiple Vitamin (MULTIVITAMIN WITH MINERALS) TABS tablet Take 1 tablet by mouth daily.    ? ondansetron (ZOFRAN) 8 MG tablet Take 1 tablet (8 mg total) by mouth every 8 (eight) hours as needed for nausea or vomiting. 60 tablet 2  ? oxyCODONE (OXY IR/ROXICODONE) 5 MG immediate release tablet Take 1 tablet (5 mg total) by mouth every 6 (six) hours as needed for severe pain. 60 tablet 0  ? prochlorperazine (COMPAZINE) 10 MG tablet Take 1 tablet (10 mg total) by mouth every 6 (six) hours as needed for nausea or vomiting. 90 tablet 2  ? simethicone (MYLICON) 962 MG  chewable tablet Chew 125 mg by mouth every 6 (six) hours as needed for flatulence. (Patient not taking: Reported on 03/16/2021)    ? sodium chloride (OCEAN) 0.65 % SOLN nasal spray Place 2 sprays into both nostrils as needed for congestion (Try this before Afrin). (Patient not taking: Reported on 03/16/2021)  0  ? Sodium Chloride Flush (NORMAL SALINE FLUSH) 0.9 % SOLN Use 5 mLs by Intracatheter route every 8 hours. 450 mL 0  ? Sodium Chloride Flush (NORMAL SALINE FLUSH) 0.9 % SOLN Inject 5 mLs by Intracatheter route daily for 20 days. 200 mL 0  ? tamsulosin (FLOMAX) 0.4 MG CAPS  capsule Take 1 capsule (0.4 mg total) by mouth daily after supper. 30 capsule 0  ? Wheat Dextrin (BENEFIBER DRINK MIX) PACK Take 1 Package by mouth daily at 6 (six) AM.    ? ?No current facility-administered medications for this visit.  ? ?Facility-Administered Medications Ordered in Other Visits  ?Medication Dose Route Frequency Provider Last Rate Last Admin  ? DOCEtaxel (TAXOTERE) 190 mg in sodium chloride 0.9 % 500 mL chemo infusion  75 mg/m2 (Treatment Plan Recorded) Intravenous Once Curt Bears, MD      ? ? ?VITAL SIGNS: ?There were no vitals taken for this visit. ?There were no vitals filed for this visit. ? ?  ?Estimated body mass index is 34.91 kg/m? as calculated from the following: ?  Height as of an earlier encounter on 03/23/21: 5\' 11"  (1.803 m). ?  Weight as of an earlier encounter on 03/23/21: 250 lb 4.8 oz (113.5 kg). ? ? ?PERFORMANCE STATUS (ECOG) : 1 - Symptomatic but completely ambulatory ? ?Physical Exam ?General: NAD, ambulatory, much improved ?Cardiovascular: RRR ?Pulmonary: normal breathing pattern  ?Extremities: no edema, moves all extremity  ?Neurological: AAO x4, mood appropriate, tearful  ? ?IMPRESSION: ? ?Raymond [name] presents today for follow-up. He is alone. No acute distress noted. Appears much better and stronger than previous visit. Expresses his appreciation that he is feeling better and that he had a good visit with Raymond [name] today. His goal was to be able to proceed with his treatment and return back to work and all of this has been approved. Shares he continues to have strong faith in what God is doing in his life. He is scheduled to speak at church tomorrow night if he feels up to given he is having treatment today.  ? ?Pain related to neoplasm  ?Raymond [name] reports pain is manageable. Is tolerating Oxy IR.  He is trying to tolerate and take as little as possible throughout the day as he is goal is to remain as functional as possible. No new concerns.  ? ?We will continue to  closely monitor and make needed adjustments.  ? ?Constipation  ?Improved with daily Miralax use. We discussed the importance of bowel regimen to prevent further complications and in addition to the use of opioids. He understands if no bowel movement in 24 hours to increase to twice daily. Encouraged water intake.  ? ?Abscess drain in place. Site normal no inflammation or pain.  ? ?Weight loss/decreased appetite  ?Piper shares that his appetite seems to have increased over the past week. States his wife is much appreciative of this. Is tolerating mirtazapine. Drinking Ensure daily.  ? ?Weight on today is 250lbs up from 249lbs on 3/14. Previous weights 267 pounds 2/23, 2/21, 2/13 268.4 lbs, 2/6 to 274 pounds.  ? ?4. Goals of Care   ? ?Mr. Arnesen is tearful expressing his happiness  of doing better this week and returning to work. We discussed taking things one day at a time.   ? ?I discussed the importance of continued conversation with family and their medical providers regarding overall plan of care and treatment options, ensuring decisions are within the context of the patients values and GOCs. ? ?PLAN: ?Oxy IR 5 mg every 6 hours as needed for pain. Will continue to closely evaluate. ?Neurontin 300 mg nightly, will continue to evaluate and increase dose for added support.  ?Miralax daily  ?Ensure daily. Encouraged increased protein and nutritional intake.  ?Continues to follow with Dr. Nelida Meuse for ongoing support.  ?Ongoing goals of care discussions and support.  ?I will plan to see patient back in 2-3 weeks in collaboration to other oncology appointments.  ? ? ?Patient expressed understanding and was in agreement with this plan. He also understands that He can call the clinic at any time with any questions, concerns, or complaints.  ? ?Time Total: 20 min.  ? ?Visit consisted of counseling and education dealing with the complex and emotionally intense issues of symptom management and palliative care in the setting  of serious and potentially life-threatening illness.Greater than 50%  of this time was spent counseling and coordinating care related to the above assessment and plan. ? ?[name], AGPCNP-BC  ?P

## 2021-03-23 NOTE — Progress Notes (Signed)
?    [facility] ?[phone]   [phone] ? ?OFFICE PROGRESS NOTE ? ?Pcp, No ?No address on file ? ?DIAGNOSIS: Stage IV (T2b, N2, M1c) non-small cell lung cancer, adenocarcinoma presented with left upper lobe lung mass in addition to left hilar and mediastinal lymphadenopathy diagnosed in September 2022. He also has metastatic disease to the brain.  ?  ?Molecular Studies: No actionable mutations on tissue tested by [facility] ? ?PD-L1 expression 10%. ? ?PRIOR THERAPY: ?1) SRS to the metastatic brain lesions under the care of Dr. Lisbeth Renshaw Scheduled for 10/21/20 ?2) Whole brain radiation under the care of Dr. Lisbeth Renshaw. Completed on 02/10/21 ?3)  Carboplatin for an AUC of 5, Alimta 500 mg per metered squared, Keytruda 200 mg IV every 3 weeks. First dose on 10/12/20. Status post 5 cycles. Discontinued due to disease progression  ?  ?CURRENT THERAPY: Docetaxel and Cyramaza IV every 3 weeks with neulasta support.  Status post 1 cycle.  Starting from cycle #2 we will discontinue Cyramza because of the recent diverticular abscess and perforation. ? ? ?INTERVAL HISTORY: ?Raymond [name] 61 y.o. male returns to the clinic today for follow-up visit.  The patient is feeling much better today with no concerning complaints.  He would like to go back to work to avoid any financial issues.  He works as a Librarian, academic and mentions that he does not do any heavy work.  He denied having any current chest pain, shortness of breath, cough or hemoptysis.  He has no nausea, vomiting, diarrhea or constipation.  He has no headache or visual changes.  He is currently undergoing drainage of the diverticular abscess and followed by interventional radiology.  His white blood count has significantly improved with the drainage.  He is here today for evaluation before resuming his treatment with chemotherapy. ? ? ?MEDICAL HISTORY: ?Past Medical History:  ?Diagnosis Date  ? Arthritis   ? Baker's cyst   ? right  ?  Diverticulitis   ? GERD (gastroesophageal reflux disease)   ? Heart attack ([place]) 01/04/2004  ? History of hiatal hernia   ? Hypertension   ? Malignant neoplasm of upper lobe of left lung ([place]) 09/24/2020  ? Pneumonia   ? as a child  ? Sleep apnea   ? no longer on Cpap  ? ? ?ALLERGIES:  is allergic to benadryl [diphenhydramine]. ? ?MEDICATIONS:  ?Current Outpatient Medications  ?Medication Sig Dispense Refill  ? acetaminophen (TYLENOL) 650 MG CR tablet Take 650 mg by mouth every 8 (eight) hours as needed for pain.    ? amLODipine (NORVASC) 10 MG tablet Take 1 tablet by mouth daily. 30 tablet 1  ? amoxicillin-clavulanate (AUGMENTIN) 875-125 MG tablet Take 1 tablet by mouth 2 times daily for 14 days. 28 tablet 0  ? dexamethasone (DECADRON) 4 MG tablet Please take 2 tablets 2 times a day the day before, the day of, and the day after treatment. 40 tablet 2  ? Ensure Plus (ENSURE PLUS) LIQD Take 237 mLs by mouth 3 (three) times daily between meals.    ? fluticasone (FLONASE) 50 MCG/ACT nasal spray Place 1 spray into both nostrils daily. 16 g 2  ? folic acid (FOLVITE) 1 MG tablet Take 1 tablet (1 mg total) by mouth daily. 30 tablet 1  ? gabapentin (NEURONTIN) 300 MG capsule Take 1 capsule (300 mg total) by mouth at bedtime. 30 capsule 0  ? guaiFENesin (MUCINEX) 600 MG 12 hr tablet Take 600 mg by mouth  2 (two) times daily.    ? Homeopathic Products (ZICAM ALLERGY RELIEF NA) Place 2 sprays into the nose daily. (Patient not taking: Reported on 03/16/2021)    ? memantine (NAMENDA) 10 MG tablet Take 1 tablet (10 mg total) by mouth 2 (two) times daily. 60 tablet 4  ? mirtazapine (REMERON) 30 MG tablet Take 1 tablet (30 mg total) by mouth at bedtime. 30 tablet 0  ? Misc Natural Products (OSTEO BI-FLEX ADV JOINT SHIELD) TABS Take 1 tablet by mouth daily. (Patient not taking: Reported on 03/16/2021)    ? Multiple Vitamin (MULTIVITAMIN WITH MINERALS) TABS tablet Take 1 tablet by mouth daily.    ? ondansetron (ZOFRAN) 8 MG tablet Take 1  tablet (8 mg total) by mouth every 8 (eight) hours as needed for nausea or vomiting. 60 tablet 2  ? oxyCODONE (OXY IR/ROXICODONE) 5 MG immediate release tablet Take 1 tablet (5 mg total) by mouth every 6 (six) hours as needed for severe pain. 60 tablet 0  ? prochlorperazine (COMPAZINE) 10 MG tablet Take 1 tablet (10 mg total) by mouth every 6 (six) hours as needed for nausea or vomiting. 90 tablet 2  ? simethicone (MYLICON) 003 MG chewable tablet Chew 125 mg by mouth every 6 (six) hours as needed for flatulence. (Patient not taking: Reported on 03/16/2021)    ? sodium chloride (OCEAN) 0.65 % SOLN nasal spray Place 2 sprays into both nostrils as needed for congestion (Try this before Afrin). (Patient not taking: Reported on 03/16/2021)  0  ? Sodium Chloride Flush (NORMAL SALINE FLUSH) 0.9 % SOLN Use 5 mLs by Intracatheter route every 8 hours. 450 mL 0  ? Sodium Chloride Flush (NORMAL SALINE FLUSH) 0.9 % SOLN Inject 5 mLs by Intracatheter route daily for 20 days. 200 mL 0  ? tamsulosin (FLOMAX) 0.4 MG CAPS capsule Take 1 capsule (0.4 mg total) by mouth daily after supper. 30 capsule 0  ? Wheat Dextrin (BENEFIBER DRINK MIX) PACK Take 1 Package by mouth daily at 6 (six) AM.    ? ?No current facility-administered medications for this visit.  ? ? ?SURGICAL HISTORY:  ?Past Surgical History:  ?Procedure Laterality Date  ? BIOPSY  01/26/2021  ? Procedure: BIOPSY;  Surgeon: Ronnette Juniper, MD;  Location: [facility] ENDOSCOPY;  Service: Gastroenterology;;  ? BRONCHIAL BIOPSY  09/14/2020  ? Procedure: BRONCHIAL BIOPSIES;  Surgeon: Collene Gobble, MD;  Location: [facility] ENDOSCOPY;  Service: Cardiopulmonary;;  ? BRONCHIAL BRUSHINGS  09/14/2020  ? Procedure: BRONCHIAL BRUSHINGS;  Surgeon: Collene Gobble, MD;  Location: [facility] ENDOSCOPY;  Service: Cardiopulmonary;;  ? BRONCHIAL NEEDLE ASPIRATION BIOPSY  09/14/2020  ? Procedure: BRONCHIAL NEEDLE ASPIRATION BIOPSIES;  Surgeon: Collene Gobble, MD;  Location: [facility] ENDOSCOPY;  Service: Cardiopulmonary;;  ?  CARDIAC CATHETERIZATION    ? CARPAL TUNNEL RELEASE    ? right  ? COLONOSCOPY WITH PROPOFOL N/A 01/26/2021  ? Procedure: COLONOSCOPY WITH PROPOFOL;  Surgeon: Ronnette Juniper, MD;  Location: [facility] ENDOSCOPY;  Service: Gastroenterology;  Laterality: N/A;  ? FIDUCIAL MARKER PLACEMENT  09/14/2020  ? Procedure: FIDUCIAL MARKER PLACEMENT;  Surgeon: Collene Gobble, MD;  Location: [facility] ENDOSCOPY;  Service: Cardiopulmonary;;  ? HAND SURGERY    ? left  ? IR CATHETER TUBE CHANGE  03/19/2021  ? IR CM INJ ANY COLONIC TUBE W/FLUORO  03/18/2021  ? KNEE SURGERY    ? left  ? VIDEO BRONCHOSCOPY N/A 09/14/2020  ? Procedure: ROBOTIC VIDEO BRONCHOSCOPY WITH FLUORO;  Surgeon: Collene Gobble, MD;  Location: [facility]  ENDOSCOPY;  Service: Cardiopulmonary;  Laterality: N/A;  ? VIDEO BRONCHOSCOPY WITH ENDOBRONCHIAL ULTRASOUND N/A 09/14/2020  ? Procedure: VIDEO BRONCHOSCOPY WITH ENDOBRONCHIAL ULTRASOUND;  Surgeon: Collene Gobble, MD;  Location: [facility] ENDOSCOPY;  Service: Cardiopulmonary;  Laterality: N/A;  ? VIDEO BRONCHOSCOPY WITH RADIAL ENDOBRONCHIAL ULTRASOUND  09/14/2020  ? Procedure: VIDEO BRONCHOSCOPY WITH RADIAL ENDOBRONCHIAL ULTRASOUND;  Surgeon: Collene Gobble, MD;  Location: [facility] ENDOSCOPY;  Service: Cardiopulmonary;;  ? ? ?REVIEW OF SYSTEMS:  Constitutional: positive for fatigue ?Eyes: negative ?Ears, nose, mouth, throat, and face: negative ?Respiratory: positive for dyspnea on exertion ?Cardiovascular: negative ?Gastrointestinal: negative ?Genitourinary:negative ?Integument/breast: negative ?Hematologic/lymphatic: negative ?Musculoskeletal:negative ?Neurological: negative ?Behavioral/Psych: negative ?Endocrine: negative ?Allergic/Immunologic: negative  ? ?PHYSICAL EXAMINATION: General appearance: alert, cooperative, fatigued, and no distress ?Head: Normocephalic, without obvious abnormality, atraumatic ?Neck: no adenopathy, no JVD, supple, symmetrical, trachea midline, and thyroid not enlarged, symmetric, no tenderness/mass/nodules ?Lymph nodes: Cervical,  supraclavicular, and axillary nodes normal. ?Resp: clear to auscultation bilaterally ?Back: symmetric, no curvature. ROM normal. No CVA tenderness. ?Cardio: regular rate and rhythm, S1, S2 normal, no murmur, c

## 2021-03-24 ENCOUNTER — Encounter: Payer: Self-pay | Admitting: Internal Medicine

## 2021-03-24 NOTE — Progress Notes (Signed)
Called patient to advise the grant the social worker mentioned to him, he already has and what he has available for medications. He verbalized understanding and has my card for any additional financial questions or concerns. ?

## 2021-03-24 NOTE — Progress Notes (Signed)
Received staff message from social worker regarding patient applying for [employer] ? ? ?Lenise and I  replied and advised patient had already used all grant funds for gift cards and medications except small amount remaining and there are no additional resources available on my end. Patient was provided my contact information and I will remind him when he calls. ?

## 2021-03-25 ENCOUNTER — Inpatient Hospital Stay: Payer: Medicaid Other

## 2021-03-25 ENCOUNTER — Encounter: Payer: Self-pay | Admitting: Medical Oncology

## 2021-03-25 ENCOUNTER — Inpatient Hospital Stay: Payer: Medicaid Other | Admitting: Licensed Clinical Social Worker

## 2021-03-25 ENCOUNTER — Encounter: Payer: Self-pay | Admitting: Licensed Clinical Social Worker

## 2021-03-25 ENCOUNTER — Other Ambulatory Visit: Payer: Self-pay

## 2021-03-25 VITALS — BP 136/81 | HR 108 | Temp 98.9°F | Resp 18

## 2021-03-25 DIAGNOSIS — C3412 Malignant neoplasm of upper lobe, left bronchus or lung: Secondary | ICD-10-CM

## 2021-03-25 DIAGNOSIS — C3492 Malignant neoplasm of unspecified part of left bronchus or lung: Secondary | ICD-10-CM

## 2021-03-25 DIAGNOSIS — Z5111 Encounter for antineoplastic chemotherapy: Secondary | ICD-10-CM | POA: Diagnosis not present

## 2021-03-25 MED ORDER — PEGFILGRASTIM-CBQV 6 MG/0.6ML ~~LOC~~ SOSY
6.0000 mg | PREFILLED_SYRINGE | Freq: Once | SUBCUTANEOUS | Status: AC
  Administered 2021-03-25: 6 mg via SUBCUTANEOUS
  Filled 2021-03-25: qty 0.6

## 2021-03-25 NOTE — Progress Notes (Signed)
[facility] CSW Progress Note ? ?Clinical Social Worker met with patient to complete the lung cancer initiative story to be shared on their website.  CSW to continue to follow as appropriate.. ? ? ? ?Henriette Combs , LCSW ?

## 2021-03-25 NOTE — Patient Instructions (Signed)

## 2021-03-26 ENCOUNTER — Telehealth: Payer: Self-pay

## 2021-03-26 NOTE — Telephone Encounter (Signed)
Raymond [name] called our office and left a VM asking about refills for his medications but did not specify which medications he needed. I attempted to call him back but he did not answer and his voicemail was full.  ?

## 2021-03-29 ENCOUNTER — Other Ambulatory Visit: Payer: Self-pay

## 2021-03-29 ENCOUNTER — Inpatient Hospital Stay: Payer: Medicaid Other

## 2021-03-29 ENCOUNTER — Telehealth: Payer: Self-pay | Admitting: Medical Oncology

## 2021-03-29 DIAGNOSIS — Z5111 Encounter for antineoplastic chemotherapy: Secondary | ICD-10-CM | POA: Diagnosis not present

## 2021-03-29 DIAGNOSIS — C3492 Malignant neoplasm of unspecified part of left bronchus or lung: Secondary | ICD-10-CM

## 2021-03-29 LAB — CMP (CANCER CENTER ONLY)
ALT: 14 U/L (ref 0–44)
AST: 19 U/L (ref 15–41)
Albumin: 3.5 g/dL (ref 3.5–5.0)
Alkaline Phosphatase: 115 U/L (ref 38–126)
Anion gap: 5 (ref 5–15)
BUN: 13 mg/dL (ref 6–20)
CO2: 29 mmol/L (ref 22–32)
Calcium: 9.5 mg/dL (ref 8.9–10.3)
Chloride: 107 mmol/L (ref 98–111)
Creatinine: 0.76 mg/dL (ref 0.61–1.24)
GFR, Estimated: >60 mL/min (ref >60)
Glucose, Bld: 93 mg/dL (ref 70–99)
Potassium: 3.5 mmol/L (ref 3.5–5.1)
Sodium: 141 mmol/L (ref 135–145)
Total Bilirubin: 0.5 mg/dL (ref 0.3–1.2)
Total Protein: 6.7 g/dL (ref 6.5–8.1)

## 2021-03-29 LAB — CBC WITH DIFFERENTIAL (CANCER CENTER ONLY)
Abs Immature Granulocytes: 0.2 10*3/uL — ABNORMAL HIGH (ref 0.00–0.07)
Basophils Absolute: 0.1 10*3/uL (ref 0.0–0.1)
Basophils Relative: 3 %
Eosinophils Absolute: 0.1 10*3/uL (ref 0.0–0.5)
Eosinophils Relative: 5 %
HCT: 35.8 % — ABNORMAL LOW (ref 39.0–52.0)
Hemoglobin: 11.6 g/dL — ABNORMAL LOW (ref 13.0–17.0)
Immature Granulocytes: 7 %
Lymphocytes Relative: 41 %
Lymphs Abs: 1.1 10*3/uL (ref 0.7–4.0)
MCH: 29.8 pg (ref 26.0–34.0)
MCHC: 32.4 g/dL (ref 30.0–36.0)
MCV: 92 fL (ref 80.0–100.0)
Monocytes Absolute: 0.2 10*3/uL (ref 0.1–1.0)
Monocytes Relative: 9 %
Neutro Abs: 0.9 10*3/uL — ABNORMAL LOW (ref 1.7–7.7)
Neutrophils Relative %: 35 %
Platelet Count: 126 10*3/uL — ABNORMAL LOW (ref 150–400)
RBC: 3.89 MIL/uL — ABNORMAL LOW (ref 4.22–5.81)
RDW: 13.9 % (ref 11.5–15.5)
WBC Count: 2.7 10*3/uL — ABNORMAL LOW (ref 4.0–10.5)
nRBC: 0 % (ref 0.0–0.2)

## 2021-03-29 NOTE — Telephone Encounter (Addendum)
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 ? 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  ? ?I called wife [name] and told her that Julien Nordmann said he can return to work but he cannot drive a forklift or do heavy lifting. ? ?I left message for  ?Mickel Baas @ [employer] with the restrictions above. ? ? ? ? ?

## 2021-03-30 ENCOUNTER — Encounter: Payer: Self-pay | Admitting: Internal Medicine

## 2021-03-30 NOTE — Progress Notes (Signed)
Clinical Social Worker already documented note in another encounter. ? ? ? ?[place] Work, LCSW ?

## 2021-03-31 ENCOUNTER — Other Ambulatory Visit (HOSPITAL_COMMUNITY): Payer: Self-pay

## 2021-04-01 ENCOUNTER — Encounter: Payer: Self-pay | Admitting: Internal Medicine

## 2021-04-05 ENCOUNTER — Other Ambulatory Visit: Payer: Self-pay | Admitting: Internal Medicine

## 2021-04-05 ENCOUNTER — Encounter (HOSPITAL_COMMUNITY): Payer: Self-pay | Admitting: Radiology

## 2021-04-05 ENCOUNTER — Ambulatory Visit: Payer: 59

## 2021-04-05 ENCOUNTER — Ambulatory Visit: Payer: 59 | Admitting: Physician Assistant

## 2021-04-05 ENCOUNTER — Encounter: Payer: Self-pay | Admitting: Licensed Clinical Social Worker

## 2021-04-05 ENCOUNTER — Other Ambulatory Visit: Payer: 59

## 2021-04-05 DIAGNOSIS — T85590A Other mechanical complication of bile duct prosthesis, initial encounter: Secondary | ICD-10-CM

## 2021-04-05 NOTE — Addendum Note (Signed)
Encounter addended by: Wolfgang Phoenix on: 04/05/2021 4:59 PM ? Actions taken: Imaging Exam ended

## 2021-04-05 NOTE — Progress Notes (Signed)
Raymond [name] Progress Note ? ?Clinical Social Worker  received a call from patient  to discuss concerns related to his car payment.  Pt contacted his car dealer who informed pt if a letter from his provider confirming pt's medical condition has prevented him from working could be sent in, pt's car payment could be deferred this month.  [name] emailed requested letter to pt's car dealership.  [name] also informed by [facility] that pt's application has been received and funds are expected to be sent to patient around the 17th of April.  Pt informed.  [name] to remain available to provide supportive services as appropriate.   ? ? ? ?Raymond [name] , LCSW ?

## 2021-04-06 ENCOUNTER — Inpatient Hospital Stay: Payer: Medicaid Other | Attending: Internal Medicine

## 2021-04-06 ENCOUNTER — Other Ambulatory Visit: Payer: Self-pay

## 2021-04-06 ENCOUNTER — Inpatient Hospital Stay: Payer: Medicaid Other | Admitting: Licensed Clinical Social Worker

## 2021-04-06 DIAGNOSIS — C3412 Malignant neoplasm of upper lobe, left bronchus or lung: Secondary | ICD-10-CM | POA: Insufficient documentation

## 2021-04-06 DIAGNOSIS — Z5111 Encounter for antineoplastic chemotherapy: Secondary | ICD-10-CM | POA: Diagnosis present

## 2021-04-06 DIAGNOSIS — C7931 Secondary malignant neoplasm of brain: Secondary | ICD-10-CM | POA: Diagnosis not present

## 2021-04-06 DIAGNOSIS — Z5189 Encounter for other specified aftercare: Secondary | ICD-10-CM | POA: Insufficient documentation

## 2021-04-06 DIAGNOSIS — C3492 Malignant neoplasm of unspecified part of left bronchus or lung: Secondary | ICD-10-CM

## 2021-04-06 LAB — CMP (CANCER CENTER ONLY)
ALT: 12 U/L (ref 0–44)
AST: 17 U/L (ref 15–41)
Albumin: 3.8 g/dL (ref 3.5–5.0)
Alkaline Phosphatase: 104 U/L (ref 38–126)
Anion gap: 9 (ref 5–15)
BUN: 18 mg/dL (ref 6–20)
CO2: 25 mmol/L (ref 22–32)
Calcium: 9.8 mg/dL (ref 8.9–10.3)
Chloride: 107 mmol/L (ref 98–111)
Creatinine: 0.85 mg/dL (ref 0.61–1.24)
GFR, Estimated: >60 mL/min (ref >60)
Glucose, Bld: 110 mg/dL — ABNORMAL HIGH (ref 70–99)
Potassium: 3.9 mmol/L (ref 3.5–5.1)
Sodium: 141 mmol/L (ref 135–145)
Total Bilirubin: 0.3 mg/dL (ref 0.3–1.2)
Total Protein: 7.1 g/dL (ref 6.5–8.1)

## 2021-04-06 LAB — CBC WITH DIFFERENTIAL (CANCER CENTER ONLY)
Abs Immature Granulocytes: 0.04 10*3/uL (ref 0.00–0.07)
Basophils Absolute: 0 10*3/uL (ref 0.0–0.1)
Basophils Relative: 0 %
Eosinophils Absolute: 0 10*3/uL (ref 0.0–0.5)
Eosinophils Relative: 0 %
HCT: 37.3 % — ABNORMAL LOW (ref 39.0–52.0)
Hemoglobin: 11.9 g/dL — ABNORMAL LOW (ref 13.0–17.0)
Immature Granulocytes: 0 %
Lymphocytes Relative: 13 %
Lymphs Abs: 1.6 10*3/uL (ref 0.7–4.0)
MCH: 29 pg (ref 26.0–34.0)
MCHC: 31.9 g/dL (ref 30.0–36.0)
MCV: 90.8 fL (ref 80.0–100.0)
Monocytes Absolute: 0.7 10*3/uL (ref 0.1–1.0)
Monocytes Relative: 5 %
Neutro Abs: 10.1 10*3/uL — ABNORMAL HIGH (ref 1.7–7.7)
Neutrophils Relative %: 82 %
Platelet Count: 209 10*3/uL (ref 150–400)
RBC: 4.11 MIL/uL — ABNORMAL LOW (ref 4.22–5.81)
RDW: 13.6 % (ref 11.5–15.5)
WBC Count: 12.4 10*3/uL — ABNORMAL HIGH (ref 4.0–10.5)
nRBC: 0 % (ref 0.0–0.2)

## 2021-04-06 LAB — TOTAL PROTEIN, URINE DIPSTICK: Protein, ur: <30 mg/dL — AB

## 2021-04-07 ENCOUNTER — Encounter: Payer: Self-pay | Admitting: Internal Medicine

## 2021-04-07 ENCOUNTER — Ambulatory Visit
Admission: RE | Admit: 2021-04-07 | Discharge: 2021-04-07 | Disposition: A | Discharge status: Home / Self Care | Payer: Self-pay | Source: Ambulatory Visit | Attending: Internal Medicine | Admitting: Internal Medicine

## 2021-04-07 ENCOUNTER — Ambulatory Visit: Payer: 59

## 2021-04-07 ENCOUNTER — Ambulatory Visit
Admission: RE | Admit: 2021-04-07 | Discharge: 2021-04-07 | Disposition: A | Discharge status: Home / Self Care | Payer: Self-pay | Source: Ambulatory Visit | Attending: Radiology | Admitting: Radiology

## 2021-04-07 DIAGNOSIS — K5720 Diverticulitis of large intestine with perforation and abscess without bleeding: Secondary | ICD-10-CM

## 2021-04-07 HISTORY — PX: IR RADIOLOGIST EVAL & MGMT: IMG5224

## 2021-04-07 NOTE — Progress Notes (Signed)
[place] CSW Progress Note ? ?Clinical Social Worker met with patient to discuss financial concerns.  Pt informed Probation officer he has received a 10 day eviction notice from his apartment complex as he and his wife are two months behind on the rent.  Pt has been unable to work following recent hospitalization.  CSW contacted the apartment complex [phone]) and was informed Arvil Chaco would be the appropriate person to speak to regarding pt's situation; however, Selinda Eon is on vacation until 4/11.  CSW informed no legal action will be taken until Michele's return.  CSW provided pt with contact details for the [facility], [facility], as well as the [employer] to set up appointments for assessment to determine if pt may be eligible for a grant.  CSW also sent emails to the [employer], [employer] and legal aid on behalf of pt.  [employer] responded to CSW and were given pt's contact details to set up an appointment.  CSW spoke w/ Ethelle Lyon at [facility] to provide details regarding pt's situation.  [facility] registered pt in their system and will meet w/ colleagues to determine if any funds are currently available to assist patient.  Email sent to [place] and voice message left for the [employer] on pt's behalf.  CSW to continue to follow to address needs as identified. ? ? ? ?[place] Work, LCSW ?

## 2021-04-07 NOTE — Progress Notes (Signed)
? ?Referring Physician(s): ?Allred,Darrell K ? ?Chief Complaint: ?The patient is seen in follow up today s/p intra-abdominal fluid collection aspiration and drainage 3/2 by Dr. Pascal Lux ? 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  After initial diagnosis in September 2022 he underwent CT Chest Abdomen Pelvis 12/17/20 for staging purposes which showed a hypermetabolic region in the colon concerning for diverticulitis vs. Pathology.  A subsequent colonoscopy showed diverticulosis, no concerning pathology.  In March of this year he presented to the ED with new onset abdominal pain.  CT imaging showed sigmoid diverticulitis with adjacent fluid collection.  He underwent aspiration and drainage 03/03/21 by Dr. Pascal Lux.   ?He was recently seen in Radiology 3/16 for drain injection at which time a fistulous connection to adjacent bowel was identified.  This was redemonstrated at injection the following day 3/17 when he underwent assessment for fractured drain exchange.   ? ?Raymond [name] returns today for follow-up.  He reports his drain continues with 30-40 mL daily output.  Output is murky, purulent vs. Feculent-appearing.  He has been eating and drinking with tolerance.  Denies new abdominal pain, nausea, vomiting.  He has been flushing once daily as instructed.  ? ?Past Medical History:  ?Diagnosis Date  ? Arthritis   ? Baker's cyst   ? right  ? Diverticulitis   ? GERD (gastroesophageal reflux disease)   ? Heart attack ([place]) 01/04/2004  ? History of hiatal hernia   ? Hypertension   ? Malignant neoplasm of upper lobe of left lung ([place]) 09/24/2020  ? Pneumonia   ? as a child  ? Sleep apnea   ? no longer on Cpap  ? ? ?Past Surgical History:  ?Procedure Laterality Date  ? BIOPSY  01/26/2021  ? Procedure: BIOPSY;  Surgeon: Ronnette Juniper, MD;  Location: [facility] ENDOSCOPY;  Service: Gastroenterology;;  ? BRONCHIAL BIOPSY  09/14/2020  ?  Procedure: BRONCHIAL BIOPSIES;  Surgeon: Collene Gobble, MD;  Location: [facility] ENDOSCOPY;  Service: Cardiopulmonary;;  ? BRONCHIAL BRUSHINGS  09/14/2020  ? Procedure: BRONCHIAL BRUSHINGS;  Surgeon: Collene Gobble, MD;  Location: [facility] ENDOSCOPY;  Service: Cardiopulmonary;;  ? BRONCHIAL NEEDLE ASPIRATION BIOPSY  09/14/2020  ? Procedure: BRONCHIAL NEEDLE ASPIRATION BIOPSIES;  Surgeon: Collene Gobble, MD;  Location[facility] ENDOSCOPY;  Service: Cardiopulmonary;;  ? CARDIAC CATHETERIZATION    ? CARPAL TUNNEL RELEASE    ? right  ? COLONOSCOPY WITH PROPOFOL N/A 01/26/2021  ? Procedure: COLONOSCOPY WITH PROPOFOL;  Surgeon: Ronnette Juniper, MD;  Location: [facility] ENDOSCOPY;  Service: Gastroenterology;  Laterality: N/A;  ? FIDUCIAL MARKER PLACEMENT  09/14/2020  ? Procedure: FIDUCIAL MARKER PLACEMENT;  Surgeon: Collene Gobble, MD;  Location[facility] ENDOSCOPY;  Service: Cardiopulmonary;;  ? HAND SURGERY    ? left  ? IR CATHETER TUBE CHANGE  03/19/2021  ? IR FLUORO RM 30-60 MIN  03/18/2021  ? IR RADIOLOGIST EVAL & MGMT  04/07/2021  ? KNEE SURGERY    ? left  ? VIDEO BRONCHOSCOPY N/A 09/14/2020  ? Procedure: ROBOTIC VIDEO BRONCHOSCOPY WITH FLUORO;  Surgeon: Collene Gobble, MD;  Location: [facility] ENDOSCOPY;  Service: Cardiopulmonary;  Laterality: N/A;  ? VIDEO BRONCHOSCOPY WITH ENDOBRONCHIAL ULTRASOUND N/A 09/14/2020  ? Procedure: VIDEO BRONCHOSCOPY WITH ENDOBRONCHIAL ULTRASOUND;  Surgeon: Collene Gobble, MD;  Location: [facility] ENDOSCOPY;  Service: Cardiopulmonary;  Laterality: N/A;  ? VIDEO BRONCHOSCOPY WITH RADIAL ENDOBRONCHIAL ULTRASOUND  09/14/2020  ?  Procedure: VIDEO BRONCHOSCOPY WITH RADIAL ENDOBRONCHIAL ULTRASOUND;  Surgeon: Collene Gobble, MD;  Location: [facility] ENDOSCOPY;  Service: Cardiopulmonary;;  ? ? ?Allergies: ?Benadryl [diphenhydramine] ? ?Medications: ?Prior to Admission medications   ?Medication Sig Start Date End Date Taking? Authorizing Provider  ?acetaminophen (TYLENOL) 650 MG CR tablet Take 650 mg by mouth every 8 (eight) hours as needed for pain.     [provider]  ?amLODipine (NORVASC) 10 MG tablet Take 1 tablet by mouth daily. 03/10/21   Mercy Riding, MD  ?dexamethasone (DECADRON) 4 MG tablet Please take 2 tablets 2 times a day the day before, the day of, and the day after treatment. 02/15/21   [name], [name], PA-C  ?Ensure Plus (ENSURE PLUS) LIQD Take 237 mLs by mouth 3 (three) times daily between meals.    [provider]  ?fluticasone (FLONASE) 50 MCG/ACT nasal spray Place 1 spray into both nostrils daily. 03/16/21   Pickenpack-Cousar, Carlena Sax, NP  ?gabapentin (NEURONTIN) 300 MG capsule Take 1 capsule by mouth at bedtime. 03/23/21 04/22/21  Pickenpack-Cousar, Carlena Sax, NP  ?guaiFENesin (MUCINEX) 600 MG 12 hr tablet Take 600 mg by mouth 2 (two) times daily.    [provider]  ?Homeopathic Products (ZICAM ALLERGY RELIEF NA) Place 2 sprays into the nose daily. ?Patient not taking: Reported on 03/16/2021    [provider]  ?memantine (NAMENDA) 10 MG tablet Take 1 tablet (10 mg total) by mouth 2 (two) times daily. 01/26/21   [name], PA-C  ?mirtazapine (REMERON) 30 MG tablet Take 1 tablet (30 mg total) by mouth at bedtime. 03/16/21   Pickenpack-Cousar, Carlena Sax, NP  ?Misc Natural Products (OSTEO BI-FLEX ADV JOINT SHIELD) TABS Take 1 tablet by mouth daily. ?Patient not taking: Reported on 03/16/2021    [provider]  ?Multiple Vitamin (MULTIVITAMIN WITH MINERALS) TABS tablet Take 1 tablet by mouth daily. 03/10/21   Mercy Riding, MD  ?ondansetron (ZOFRAN) 8 MG tablet Take 1 tablet (8 mg total) by mouth every 8 (eight) hours as needed for nausea or vomiting. 02/25/21   Pickenpack-Cousar, Carlena Sax, NP  ?oxyCODONE (OXY IR/ROXICODONE) 5 MG immediate release tablet Take 1 tablet (5 mg total) by mouth every 6 (six) hours as needed for severe pain. 03/16/21   Pickenpack-Cousar, Carlena Sax, NP  ?prochlorperazine (COMPAZINE) 10 MG tablet Take 1 tablet (10 mg total) by mouth every 6 (six) hours as needed for  nausea or vomiting. 02/25/21   Pickenpack-Cousar, Carlena Sax, NP  ?simethicone (MYLICON) 458 MG chewable tablet Chew 125 mg by mouth every 6 (six) hours as needed for flatulence. ?Patient not taking: Reported on 03/16/2021    [provider]  ?sodium chloride (OCEAN) 0.65 % SOLN nasal spray Place 2 sprays into both nostrils as needed for congestion (Try this before Afrin). ?Patient not taking: Reported on 03/16/2021 03/09/21   Mercy Riding, MD  ?Sodium Chloride Flush (NORMAL SALINE FLUSH) 0.9 % SOLN Use 5 mLs by Intracatheter route every 8 hours. 03/09/21 04/08/21  Mercy Riding, MD  ?tamsulosin (FLOMAX) 0.4 MG CAPS capsule Take 1 capsule (0.4 mg total) by mouth daily after supper. 01/27/21   [name], MD  ?Wheat Dextrin (BENEFIBER DRINK MIX) PACK Take 1 Package by mouth daily at 6 (six) AM.    [provider]  ?  ? ?Family History  ?Problem Relation Age of Onset  ? Lung cancer Mother   ? Heart failure Sister   ? ? ?Social History  ? ?  Socioeconomic History  ? Marital status: Married  ?  Spouse name: Not on file  ? Number of children: Not on file  ? Years of education: Not on file  ? Highest education level: Not on file  ?Occupational History  ? Not on file  ?Tobacco Use  ? Smoking status: Every Day  ?  Types: Cigarettes  ? Smokeless tobacco: Never  ?Vaping Use  ? Vaping Use: Never used  ?Substance and Sexual Activity  ? Alcohol use: No  ? Drug use: No  ? Sexual activity: Not on file  ?Other Topics Concern  ? Not on file  ?Social History Narrative  ? Not on file  ? ?Social Determinants of Health  ? ?Financial Resource Strain: High Risk  ? Difficulty of Paying Living Expenses: Hard  ?Food Insecurity: No Food Insecurity  ? Worried About Charity fundraiser in the Last Year: Never true  ? Ran Out of Food in the Last Year: Never true  ?Transportation Needs: No Transportation Needs  ? Lack of Transportation (Medical): No  ? Lack of Transportation (Non-Medical): No  ?Physical Activity: Not on file   ?Stress: Stress Concern Present  ? Feeling of Stress : Very much  ?Social Connections: Socially Integrated  ? Frequency of Communication with Friends and Family: More than three times a week  ? Frequency

## 2021-04-08 ENCOUNTER — Other Ambulatory Visit: Payer: Self-pay

## 2021-04-08 ENCOUNTER — Other Ambulatory Visit (HOSPITAL_COMMUNITY): Payer: Self-pay

## 2021-04-09 ENCOUNTER — Other Ambulatory Visit: Payer: Self-pay | Admitting: Nurse Practitioner

## 2021-04-09 ENCOUNTER — Encounter: Payer: Self-pay | Admitting: Licensed Clinical Social Worker

## 2021-04-09 ENCOUNTER — Other Ambulatory Visit (HOSPITAL_COMMUNITY): Payer: Self-pay

## 2021-04-09 ENCOUNTER — Ambulatory Visit: Payer: 59 | Admitting: Psychologist

## 2021-04-09 ENCOUNTER — Encounter: Payer: Self-pay | Admitting: *Deleted

## 2021-04-09 DIAGNOSIS — R630 Anorexia: Secondary | ICD-10-CM

## 2021-04-09 DIAGNOSIS — C3492 Malignant neoplasm of unspecified part of left bronchus or lung: Secondary | ICD-10-CM

## 2021-04-09 MED ORDER — MIRTAZAPINE 30 MG PO TABS: 30.0000 mg | ORAL_TABLET | Freq: Every day | ORAL | 0 refills | Status: DC

## 2021-04-09 MED ORDER — OXYCODONE HCL 5 MG PO TABS
5.0000 mg | ORAL_TABLET | Freq: Four times a day (QID) | ORAL | 0 refills | Status: DC | PRN
Start: 2021-04-09 — End: 2021-04-13

## 2021-04-09 NOTE — Progress Notes (Signed)
Raymond [name] ? ?Clinical Social Worker met with patient to complete intake application for the [employer].  Signatures obtained as well as requested documentation.  Application scanned and emailed to [name] at the [employer].  CSW informed by [name] at the [facility] that pt's SSD application was submitted on February 14.  [name] to reach out to her supervisor to investigate the status of pt's application as it should have been reviewed and a determination made.  [name] to reach out directly to the patient to inform of status of application.  CSW to continue to follow patient as appropriate.   ? ? ? ?Henriette Combs , LCSW ?

## 2021-04-09 NOTE — Progress Notes (Signed)
Oncology Nurse Navigator Documentation ? ? ?  04/09/2021  ? 12:00 PM 03/16/2021  ? 11:00 AM 11/23/2020  ? 11:00 AM  ?Oncology Nurse Navigator Flowsheets  ?Abnormal Finding Date   09/01/2020  ?Confirmed Diagnosis Date   09/14/2020  ?Diagnosis Status   Confirmed Diagnosis Complete  ?Planned Course of Treatment   Chemotherapy  ?Phase of Treatment   Radiation  ?Chemotherapy Actual Start Date:   10/05/2020  ?Radiation Actual Start Date:   10/13/2020  ?Radiation Actual End Date:   10/21/2020  ?Navigator Follow Up Date:  03/19/2021 11/25/2020  ?Navigator Follow Up Reason:  Appointment Review Other:  ?Navigator Location [place]  ?Navigator Encounter Type Other: Clinic/MDC Clinic/MDC  ?Treatment Initiated Date   10/05/2020  ?Patient Visit Type  Follow-up Follow-up  ?Treatment Phase  Treatment Treatment  ?Barriers/Navigation Needs Coordination of Care Coordination of Care Education;Healthcare System Knowledge Deficit;No Insurance  ?Education   Other;Concerns with Insurance Coverage  ?Interventions Coordination of Care/I received a message from provider to make sure patient was not lost to follow up.  Patient is scheduled to be seen with med onc next week.  No navigation needs identified.  Coordination of Care;Psycho-Social Support Education;Psycho-Social Support;Referrals  ?Acuity Level 2-Minimal Needs (1-2 Barriers Identified) Level 3-Moderate Needs (3-4 Barriers Identified) Level 2-Minimal Needs (1-2 Barriers Identified)  ?Referrals  Other Other  ?Coordination of Care Other Other   ?Education Method   Verbal;Written  ?Time Spent with Patient 30 45 30  ?  ?

## 2021-04-12 ENCOUNTER — Other Ambulatory Visit: Payer: 59

## 2021-04-12 NOTE — Progress Notes (Signed)
[place] ?OFFICE PROGRESS NOTE ? ?Pcp, No ?No address on file ? ?DIAGNOSIS: Stage IV (T2b, N2, M1c) non-small cell lung cancer, adenocarcinoma presented with left upper lobe lung mass in addition to left hilar and mediastinal lymphadenopathy diagnosed in September 2022. He also has metastatic disease to the brain.  ?  ?Molecular Studies: No actionable mutations on tissue tested by [facility] ? ?PD-L1 expression 10%. ? ?PRIOR THERAPY:  ?1) SRS to the metastatic brain lesions under the care of Dr. Lisbeth Renshaw Scheduled for 10/21/20 ?2) Whole brain radiation under the care of Dr. Lisbeth Renshaw. Completed on 02/10/21 ?3)  Carboplatin for an AUC of 5, Alimta 500 mg per metered squared, Keytruda 200 mg IV every 3 weeks. First dose on 10/12/20. Status post 5 cycles. Discontinued due to disease progression  ? ?CURRENT THERAPY: Docetaxel and Cyramaza IV every 3 weeks with neulasta support.  Status post 2 cycles.  Starting from cycle #2 we will discontinue Cyramza because of the recent diverticular abscess and perforation.  ? ?INTERVAL HISTORY: ?Raymond [name] 61 y.o. male returns to the clinic today for a follow-up visit.  The patient has been having a lot of challenges since being diagnosed with metastatic lung cancer in September 2022.  The most recent challenge has been related to a hospitalization earlier last month who presented to the emergency room with progressively worsening low lower left quadrant pain with nausea, vomiting, and fever.  The patient has a history of diverticulitis numerous times this year.  A CT scan showed sigmoid diverticulitis with an abscess.  He completed IV antibiotics.  He is following closely with IR for a drainage placement.  His most recent appointment with interventional radiology was on 04/07/2021.  The interventional radiologist noted that looking at the drain injection and from reviewing previous CTs he suspects this may be a malignant perforation over a diverticular abscess.  He  noted that the patient previous Had had a colonoscopy by Dr. Therisa Doyne on 1/23 and was supposed have a 68-month follow-up visit although it has not been scheduled.  Dr. Julien Nordmann has discontinued the patient's Cyramza due to the possible perforation. ? ?Socially, the patient was trying to go back to work to avoid further financial issues.  The patient is followed closely by social work.  The patient is also followed closely by palliative care.  He reports weight loss and decreased appetite for which he is on mirtazapine. The dose of remeron was increased recently. He is requiring a refill. He also drinks Ensure daily.  He is on oxycodone 5 mg every 6 hours as needed for pain, Neurontin, and MiraLAX. He reports he has been taking his pain medication more than prescribed. He sometimes will take 2 tablets.  He localizes his pain to the location of his drain.  He reports his pain is similar to prior.  He sees Dr. Michail Sermon for his depression and anxiety due to his medical issues. ? ?When I saw him last time, he was endorsing bilateral hearing deficit.  He was supposed to see ENT but this appointment had been canceled due to his hospitalization.  He has not had this rescheduled at this time.  He reports that his hearing is "a little bit better". ? ?Overall the patient is feeling "better" today.  He denies any fever, chills, or night sweats.  He still has a decreased appetite.  He also states that food taste "nasty".  On exam, he does have some evidence of thrush.  He is scheduled  to see a member the nutritionist team today.  He denies any changes with his breathing and states his breathing is "okay, it is good".  He still has some dyspnea on exertion.  He denies any significant cough, chest pain, or hemoptysis.  He reports some constipation for which she uses Benefiber and Dulcolax.  Denies any nausea or vomiting.  He is here today for evaluation and repeat blood work before starting the next cycle of treatment with single agent  docetaxel. ? ?MEDICAL HISTORY: ?Past Medical History:  ?Diagnosis Date  ? Arthritis   ? Baker's cyst   ? right  ? Diverticulitis   ? GERD (gastroesophageal reflux disease)   ? Heart attack ([place]) 01/04/2004  ? History of hiatal hernia   ? Hypertension   ? Malignant neoplasm of upper lobe of left lung ([place]) 09/24/2020  ? Pneumonia   ? as a child  ? Sleep apnea   ? no longer on Cpap  ? ? ?ALLERGIES:  is allergic to benadryl [diphenhydramine]. ? ?MEDICATIONS:  ?Current Outpatient Medications  ?Medication Sig Dispense Refill  ? fluconazole (DIFLUCAN) 100 MG tablet Take 1 tablet (100 mg total) by mouth daily. 10 tablet 0  ? acetaminophen (TYLENOL) 650 MG CR tablet Take 650 mg by mouth every 8 (eight) hours as needed for pain.    ? amLODipine (NORVASC) 10 MG tablet Take 1 tablet by mouth daily. 30 tablet 1  ? dexamethasone (DECADRON) 4 MG tablet Please take 2 tablets 2 times a day the day before, the day of, and the day after treatment. 40 tablet 2  ? Ensure Plus (ENSURE PLUS) LIQD Take 237 mLs by mouth 3 (three) times daily between meals.    ? fluticasone (FLONASE) 50 MCG/ACT nasal spray Place 1 spray into both nostrils daily. 16 g 2  ? gabapentin (NEURONTIN) 300 MG capsule Take 2 capsules (600 mg total) by mouth at bedtime. 60 capsule 0  ? guaiFENesin (MUCINEX) 600 MG 12 hr tablet Take 600 mg by mouth 2 (two) times daily.    ? memantine (NAMENDA) 10 MG tablet Take 1 tablet (10 mg total) by mouth 2 (two) times daily. 60 tablet 4  ? mirtazapine (REMERON) 30 MG tablet Take 1 tablet (30 mg total) by mouth at bedtime. 30 tablet 0  ? Misc Natural Products (OSTEO BI-FLEX ADV JOINT SHIELD) TABS Take 1 tablet by mouth daily. (Patient not taking: Reported on 03/16/2021)    ? Multiple Vitamin (MULTIVITAMIN WITH MINERALS) TABS tablet Take 1 tablet by mouth daily.    ? ondansetron (ZOFRAN) 8 MG tablet Take 1 tablet (8 mg total) by mouth every 8 (eight) hours as needed for nausea or vomiting. 60 tablet 2  ? oxyCODONE (OXY IR/ROXICODONE)  5 MG immediate release tablet Take 1-2 tablets (5-10 mg total) by mouth every 6 (six) hours as needed for severe pain. 90 tablet 0  ? prochlorperazine (COMPAZINE) 10 MG tablet Take 1 tablet (10 mg total) by mouth every 6 (six) hours as needed for nausea or vomiting. 90 tablet 2  ? senna-docusate (SENNA S) 8.6-50 MG tablet Take 2 tablets by mouth daily. 60 tablet 2  ? tamsulosin (FLOMAX) 0.4 MG CAPS capsule Take 1 capsule (0.4 mg total) by mouth daily after supper. 30 capsule 0  ? Wheat Dextrin (BENEFIBER DRINK MIX) PACK Take 1 Package by mouth daily at 6 (six) AM.    ? ?No current facility-administered medications for this visit.  ? ?Facility-Administered Medications Ordered in Other Visits  ?Medication  Dose Route Frequency Provider Last Rate Last Admin  ? DOCEtaxel (TAXOTERE) 190 mg in sodium chloride 0.9 % 500 mL chemo infusion  75 mg/m2 (Treatment Plan Recorded) Intravenous Once Curt Bears, MD      ? ? ?SURGICAL HISTORY:  ?Past Surgical History:  ?Procedure Laterality Date  ? BIOPSY  01/26/2021  ? Procedure: BIOPSY;  Surgeon: Ronnette Juniper, MD;  Location: [facility] ENDOSCOPY;  Service: Gastroenterology;;  ? BRONCHIAL BIOPSY  09/14/2020  ? Procedure: BRONCHIAL BIOPSIES;  Surgeon: Collene Gobble, MD;  Location: [facility] ENDOSCOPY;  Service: Cardiopulmonary;;  ? BRONCHIAL BRUSHINGS  09/14/2020  ? Procedure: BRONCHIAL BRUSHINGS;  Surgeon: Collene Gobble, MD;  Location: [facility] ENDOSCOPY;  Service: Cardiopulmonary;;  ? BRONCHIAL NEEDLE ASPIRATION BIOPSY  09/14/2020  ? Procedure: BRONCHIAL NEEDLE ASPIRATION BIOPSIES;  Surgeon: Collene Gobble, MD;  Location: [facility] ENDOSCOPY;  Service: Cardiopulmonary;;  ? CARDIAC CATHETERIZATION    ? CARPAL TUNNEL RELEASE    ? right  ? COLONOSCOPY WITH PROPOFOL N/A 01/26/2021  ? Procedure: COLONOSCOPY WITH PROPOFOL;  Surgeon: Ronnette Juniper, MD;  Location: [facility] ENDOSCOPY;  Service: Gastroenterology;  Laterality: N/A;  ? FIDUCIAL MARKER PLACEMENT  09/14/2020  ? Procedure: FIDUCIAL MARKER PLACEMENT;  Surgeon:  Collene Gobble, MD;  Location: [facility] ENDOSCOPY;  Service: Cardiopulmonary;;  ? HAND SURGERY    ? left  ? IR CATHETER TUBE CHANGE  03/19/2021  ? IR FLUORO RM 30-60 MIN  03/18/2021  ? IR RADIOLOGIST EVAL & MGMT

## 2021-04-13 ENCOUNTER — Encounter: Payer: Self-pay | Admitting: Nurse Practitioner

## 2021-04-13 ENCOUNTER — Other Ambulatory Visit: Payer: Self-pay

## 2021-04-13 ENCOUNTER — Inpatient Hospital Stay: Payer: Medicaid Other | Admitting: Dietician

## 2021-04-13 ENCOUNTER — Inpatient Hospital Stay (HOSPITAL_BASED_OUTPATIENT_CLINIC_OR_DEPARTMENT_OTHER): Payer: Medicaid Other | Admitting: Physician Assistant

## 2021-04-13 ENCOUNTER — Other Ambulatory Visit (HOSPITAL_COMMUNITY): Payer: Self-pay

## 2021-04-13 ENCOUNTER — Inpatient Hospital Stay: Payer: Medicaid Other

## 2021-04-13 ENCOUNTER — Encounter: Payer: Self-pay | Admitting: Internal Medicine

## 2021-04-13 ENCOUNTER — Inpatient Hospital Stay (HOSPITAL_BASED_OUTPATIENT_CLINIC_OR_DEPARTMENT_OTHER): Payer: Medicaid Other | Admitting: Nurse Practitioner

## 2021-04-13 VITALS — BP 131/85 | HR 92 | Temp 98.5°F | Resp 18 | Ht 71.0 in | Wt 248.5 lb

## 2021-04-13 DIAGNOSIS — K5903 Drug induced constipation: Secondary | ICD-10-CM

## 2021-04-13 DIAGNOSIS — B370 Candidal stomatitis: Secondary | ICD-10-CM | POA: Insufficient documentation

## 2021-04-13 DIAGNOSIS — Z5111 Encounter for antineoplastic chemotherapy: Secondary | ICD-10-CM | POA: Diagnosis not present

## 2021-04-13 DIAGNOSIS — C3492 Malignant neoplasm of unspecified part of left bronchus or lung: Secondary | ICD-10-CM

## 2021-04-13 DIAGNOSIS — C7931 Secondary malignant neoplasm of brain: Secondary | ICD-10-CM | POA: Diagnosis not present

## 2021-04-13 DIAGNOSIS — Z515 Encounter for palliative care: Secondary | ICD-10-CM

## 2021-04-13 DIAGNOSIS — C3412 Malignant neoplasm of upper lobe, left bronchus or lung: Secondary | ICD-10-CM

## 2021-04-13 DIAGNOSIS — R530 Neoplastic (malignant) related fatigue: Secondary | ICD-10-CM

## 2021-04-13 DIAGNOSIS — R630 Anorexia: Secondary | ICD-10-CM

## 2021-04-13 DIAGNOSIS — G893 Neoplasm related pain (acute) (chronic): Secondary | ICD-10-CM

## 2021-04-13 DIAGNOSIS — R634 Abnormal weight loss: Secondary | ICD-10-CM

## 2021-04-13 LAB — CMP (CANCER CENTER ONLY)
ALT: 13 U/L (ref 0–44)
AST: 15 U/L (ref 15–41)
Albumin: 3.7 g/dL (ref 3.5–5.0)
Alkaline Phosphatase: 112 U/L (ref 38–126)
Anion gap: 9 (ref 5–15)
BUN: 12 mg/dL (ref 6–20)
CO2: 24 mmol/L (ref 22–32)
Calcium: 9.5 mg/dL (ref 8.9–10.3)
Chloride: 107 mmol/L (ref 98–111)
Creatinine: 0.78 mg/dL (ref 0.61–1.24)
GFR, Estimated: >60 mL/min (ref >60)
Glucose, Bld: 136 mg/dL — ABNORMAL HIGH (ref 70–99)
Potassium: 4 mmol/L (ref 3.5–5.1)
Sodium: 140 mmol/L (ref 135–145)
Total Bilirubin: 0.4 mg/dL (ref 0.3–1.2)
Total Protein: 7 g/dL (ref 6.5–8.1)

## 2021-04-13 LAB — CBC WITH DIFFERENTIAL (CANCER CENTER ONLY)
Abs Immature Granulocytes: 0.04 10*3/uL (ref 0.00–0.07)
Basophils Absolute: 0 10*3/uL (ref 0.0–0.1)
Basophils Relative: 0 %
Eosinophils Absolute: 0 10*3/uL (ref 0.0–0.5)
Eosinophils Relative: 0 %
HCT: 36.4 % — ABNORMAL LOW (ref 39.0–52.0)
Hemoglobin: 11.9 g/dL — ABNORMAL LOW (ref 13.0–17.0)
Immature Granulocytes: 0 %
Lymphocytes Relative: 7 %
Lymphs Abs: 0.8 10*3/uL (ref 0.7–4.0)
MCH: 29.4 pg (ref 26.0–34.0)
MCHC: 32.7 g/dL (ref 30.0–36.0)
MCV: 89.9 fL (ref 80.0–100.0)
Monocytes Absolute: 0.2 10*3/uL (ref 0.1–1.0)
Monocytes Relative: 2 %
Neutro Abs: 10 10*3/uL — ABNORMAL HIGH (ref 1.7–7.7)
Neutrophils Relative %: 91 %
Platelet Count: 272 10*3/uL (ref 150–400)
RBC: 4.05 MIL/uL — ABNORMAL LOW (ref 4.22–5.81)
RDW: 14.1 % (ref 11.5–15.5)
WBC Count: 11 10*3/uL — ABNORMAL HIGH (ref 4.0–10.5)
nRBC: 0 % (ref 0.0–0.2)

## 2021-04-13 MED ORDER — FLUCONAZOLE 100 MG PO TABS
100.0000 mg | ORAL_TABLET | Freq: Every day | ORAL | 0 refills | Status: DC
  Filled 2021-04-13: qty 10, 10d supply, fill #0

## 2021-04-13 MED ORDER — GABAPENTIN 300 MG PO CAPS
600.0000 mg | ORAL_CAPSULE | Freq: Every day | ORAL | 0 refills | Status: DC
  Filled 2021-04-13: qty 60, 30d supply, fill #0

## 2021-04-13 MED ORDER — ONDANSETRON HCL 8 MG PO TABS: 8.0000 mg | ORAL_TABLET | Freq: Three times a day (TID) | ORAL | 2 refills | Status: DC | PRN

## 2021-04-13 MED ORDER — SENNOSIDES-DOCUSATE SODIUM 8.6-50 MG PO TABS
2.0000 | ORAL_TABLET | Freq: Every day | ORAL | 2 refills | Status: DC
  Filled 2021-04-13: qty 60, 30d supply, fill #0

## 2021-04-13 MED ORDER — OXYCODONE HCL 5 MG PO TABS
5.0000 mg | ORAL_TABLET | Freq: Four times a day (QID) | ORAL | 0 refills | Status: DC | PRN
Start: 2021-04-13 — End: 2021-05-05
  Filled 2021-04-13: qty 90, 12d supply, fill #0

## 2021-04-13 MED ORDER — SODIUM CHLORIDE 0.9 % IV SOLN
75.0000 mg/m2 | Freq: Once | INTRAVENOUS | Status: AC
  Administered 2021-04-13: 190 mg via INTRAVENOUS
  Filled 2021-04-13: qty 19

## 2021-04-13 MED ORDER — MIRTAZAPINE 30 MG PO TABS
30.0000 mg | ORAL_TABLET | Freq: Every day | ORAL | 0 refills | Status: DC
  Filled 2021-04-13: qty 30, 30d supply, fill #0

## 2021-04-13 MED ORDER — SODIUM CHLORIDE 0.9 % IV SOLN
10.0000 mg | Freq: Once | INTRAVENOUS | Status: AC
  Administered 2021-04-13: 10 mg via INTRAVENOUS
  Filled 2021-04-13: qty 10

## 2021-04-13 NOTE — Patient Instructions (Signed)
[facility] MEDICAL ONCOLOGY   ?Discharge Instructions: ?Thank you for choosing [facility] to provide your oncology and hematology care.  ? ?If you have a lab appointment with the Cancer Center, please go directly to the Cancer Center and check in at the registration area. ?  ?Wear comfortable clothing and clothing appropriate for easy access to any Portacath or PICC line.  ? ?We strive to give you quality time with your provider. You may need to reschedule your appointment if you arrive late (15 or more minutes).  Arriving late affects you and other patients whose appointments are after yours.  Also, if you miss three or more appointments without notifying the office, you may be dismissed from the clinic at the provider?s discretion.    ?  ?For prescription refill requests, have your pharmacy contact our office and allow 72 hours for refills to be completed.   ? ?Today you received the following chemotherapy and/or immunotherapy agents: docetaxel    ?  ?To help prevent nausea and vomiting after your treatment, we encourage you to take your nausea medication as directed. ? ?BELOW ARE SYMPTOMS THAT SHOULD BE REPORTED IMMEDIATELY: ?*FEVER GREATER THAN 100.4 F (38 ?C) OR HIGHER ?*CHILLS OR SWEATING ?*NAUSEA AND VOMITING THAT IS NOT CONTROLLED WITH YOUR NAUSEA MEDICATION ?*UNUSUAL SHORTNESS OF BREATH ?*UNUSUAL BRUISING OR BLEEDING ?*URINARY PROBLEMS (pain or burning when urinating, or frequent urination) ?*BOWEL PROBLEMS (unusual diarrhea, constipation, pain near the anus) ?TENDERNESS IN MOUTH AND THROAT WITH OR WITHOUT PRESENCE OF ULCERS (sore throat, sores in mouth, or a toothache) ?UNUSUAL RASH, SWELLING OR PAIN  ?UNUSUAL VAGINAL DISCHARGE OR ITCHING  ? ?Items with * indicate a potential emergency and should be followed up as soon as possible or go to the Emergency Department if any problems should occur. ? ?Please show the CHEMOTHERAPY ALERT CARD or IMMUNOTHERAPY ALERT CARD at check-in  to the Emergency Department and triage nurse. ? ?Should you have questions after your visit or need to cancel or reschedule your appointment, please contact [facility] MEDICAL [facility]COL[facility]  Dept: [phone]  and follow the prompts.  Office hours are 8:00 a.m. to 4:30 p.m. Monday - Friday. Please note that voicemails left after 4:00 p.m. may not be returned until the following business day.  We are closed weekends and major holidays. You have access to a nurse at all times for urgent questions. Please call the main number to the clinic Dept: [phone] and follow the prompts. ? ? ?For any non-urgent questions, you may also contact your provider using MyChart. We now offer e-Visits for anyone 18 and older to request care online for non-urgent symptoms. For details visit [email or web address]. ?  ?Also download the MyChart app! Go to the app store, search "MyChart", open the app, select [facility], and log in with your MyChart username and password. ? ?Due to Covid, a mask is required upon entering the hospital/clinic. If you do not have a mask, one will be given to you upon arrival. For doctor visits, patients may have 1 support person aged 18 or older with them. For treatment visits, patients cannot have anyone with them due to current Covid guidelines and our immunocompromised population.  ? ?

## 2021-04-13 NOTE — Progress Notes (Signed)
Nutrition Follow-up: ? ?Patient with non-small cell lung cancer with osseous and brain metastasis. S/p whole brain radiation (02/10/21) under the care of Dr. Lisbeth Renshaw. He is currently receiving docetaxel  ? ?3/1-3/7 - hospitalized for diverticulitis with abscess s/p CT guided drain placement on 3/2 ? ? ?Met with patient during infusion. He is eating [place] sandwich and chips at visit. Patient reports altered taste and smell had improved, but now food taste "nasty" again. Evidence of thrush noted per PA-C exam today. Patient reports feeling hungry, but tired of his stomach hurting all the time. Patient reports he feels his best when he hasn't eaten. Patient has been eating a lot of peanut butter and jelly sandwiches. He drinks 3-4 Ensure Plus, they go down easy. Patient recalls one prostat daily (100 kcal, 15 g protein). This taste horrible. Patient drinking 4-6 bottles of water. He is constipated. Patient is on daily bowel regimen.  ? ? ?Medications: gabapentin, senna-docusate, oxycodone, zofran, diflucan, remeron ? ?Labs: glucose 136 ? ?Anthropometrics: Weight 248 lb 8 oz today  ? ?3/21 - 250 lb 4.8 oz ?3/14 - 249 lb 3.2 oz  ?3/01 - 254 lb  ? ? ?NUTRITION DIAGNOSIS: Inadequate oral intake ongoing  ? ? ? ?INTERVENTION:  ?Continued education on small frequent meals with adequate calories and protein ?Continue drinking 3-4 Ensure Plus/equivalent daily ?One complimentary case of Ensure Plus High Protein provided  ?Continue prostat daily, suggested adding to juice for improved taste ?Discussed strategies for constipation - handout provided ?Continue daily bowel regimen  ?Reviewed strategies for altered taste, suggested baking soda salt water rinses before meals. Educated pt to sanitize/purchase new toothbrush after thrush has cleared ? ? ?MONITORING, EVALUATION, GOAL: weight trends, intake ? ? ?NEXT VISIT: Tuesday May 2 during infusion ? ? ? ?

## 2021-04-13 NOTE — Progress Notes (Signed)
? ?  ?Palliative Medicine ?[facility]  ?Telephone:([phone]) [phone] [phone] ? ? ?Name: Raymond [name] ?Date: 04/13/2021 ?MRN: [id number]  ?DOB: [birth year] ? ?Patient Care Team: ?Pcp, No as PCP - General ?Valrie Hart, RN as Oncology Nurse Navigator (Oncology) ?Conception Chancy, PsyD (Psychology)  ? ? ?REASON FOR CONSULTATION: ?Raymond [name] is a 61 y.o. male with medical history including stage IV non-small cell lung cancer, adenocarcinoma of the left lower lobe, mediastinal lymphadenopathy with brain metastasis, s/p 5 cycles of carboplatin, Keytruda, and Alimta, stereotactic radiosurgery to brain mets, hypertension, and GERD.  Recent MRI on (01/21/21) showed significant increase in enhancing metastatic cerebral and cerebellar hemisphere lesions. Now undergoing a total of 10 whole brain radiation treatments. Palliative ask to see for ongoing symptom management support and goals of care.  ? ? ?SOCIAL HISTORY:    ? reports that he has been smoking cigarettes. He has never used smokeless tobacco. He reports that he does not drink alcohol and does not use drugs. ? ?ADVANCE DIRECTIVES:  ?None on file. Education provided. Raymond [name] aware his wife, Raymond [name] is his medical decision maker in the setting of no document. He was given an advanced directive packet with plans to complete.  ? ?CODE STATUS: Full code ? ? ?ALLERGIES:  is allergic to benadryl [diphenhydramine]. ? ?MEDICATIONS:  ?Current Outpatient Medications  ?Medication Sig Dispense Refill  ? acetaminophen (TYLENOL) 650 MG CR tablet Take 650 mg by mouth every 8 (eight) hours as needed for pain.    ? amLODipine (NORVASC) 10 MG tablet Take 1 tablet by mouth daily. 30 tablet 1  ? dexamethasone (DECADRON) 4 MG tablet Please take 2 tablets 2 times a day the day before, the day of, and the day after treatment. 40 tablet 2  ? Ensure Plus (ENSURE PLUS) LIQD Take 237 mLs by mouth 3 (three) times daily between meals.    ? fluconazole (DIFLUCAN) 100  MG tablet Take 1 tablet (100 mg total) by mouth daily. 10 tablet 0  ? fluticasone (FLONASE) 50 MCG/ACT nasal spray Place 1 spray into both nostrils daily. 16 g 2  ? gabapentin (NEURONTIN) 300 MG capsule Take 1 capsule by mouth at bedtime. 30 capsule 0  ? guaiFENesin (MUCINEX) 600 MG 12 hr tablet Take 600 mg by mouth 2 (two) times daily.    ? Homeopathic Products (ZICAM ALLERGY RELIEF NA) Place 2 sprays into the nose daily. (Patient not taking: Reported on 03/16/2021)    ? memantine (NAMENDA) 10 MG tablet Take 1 tablet (10 mg total) by mouth 2 (two) times daily. 60 tablet 4  ? mirtazapine (REMERON) 30 MG tablet Take 1 tablet (30 mg total) by mouth at bedtime. 30 tablet 0  ? Misc Natural Products (OSTEO BI-FLEX ADV JOINT SHIELD) TABS Take 1 tablet by mouth daily. (Patient not taking: Reported on 03/16/2021)    ? Multiple Vitamin (MULTIVITAMIN WITH MINERALS) TABS tablet Take 1 tablet by mouth daily.    ? ondansetron (ZOFRAN) 8 MG tablet Take 1 tablet (8 mg total) by mouth every 8 (eight) hours as needed for nausea or vomiting. 60 tablet 2  ? oxyCODONE (OXY IR/ROXICODONE) 5 MG immediate release tablet Take 1 tablet (5 mg total) by mouth every 6 (six) hours as needed for severe pain. 60 tablet 0  ? prochlorperazine (COMPAZINE) 10 MG tablet Take 1 tablet (10 mg total) by mouth every 6 (six) hours as needed for nausea or vomiting. 90 tablet 2  ? simethicone (MYLICON) 937 MG  chewable tablet Chew 125 mg by mouth every 6 (six) hours as needed for flatulence. (Patient not taking: Reported on 03/16/2021)    ? sodium chloride (OCEAN) 0.65 % SOLN nasal spray Place 2 sprays into both nostrils as needed for congestion (Try this before Afrin). (Patient not taking: Reported on 03/16/2021)  0  ? tamsulosin (FLOMAX) 0.4 MG CAPS capsule Take 1 capsule (0.4 mg total) by mouth daily after supper. 30 capsule 0  ? Wheat Dextrin (BENEFIBER DRINK MIX) PACK Take 1 Package by mouth daily at 6 (six) AM.    ? ?No current facility-administered  medications for this visit.  ? ? ?VITAL SIGNS: ?There were no vitals taken for this visit. ?There were no vitals filed for this visit. ? ?  ?Estimated body mass index is 34.66 kg/m? as calculated from the following: ?  Height as of an earlier encounter on 04/13/21: 5\' 11"  (1.803 m). ?  Weight as of an earlier encounter on 04/13/21: 248 lb 8 oz (112.7 kg). ? ? ?PERFORMANCE STATUS (ECOG) : 1 - Symptomatic but completely ambulatory ? ?Physical Exam ?General: NAD, in wheelchair ?Cardiovascular: RRR ?Pulmonary: normal breathing pattern  ?Extremities: no edema, moves all extremity  ?Skin: warm, dry, flaky, alopecia  ?Neurological: AAO x4, mood appropriate, tearful  ? ?IMPRESSION: ? ?Raymond [name] presents today for follow-up. No family present. No acute distress noted. In a wheelchair. States he is feeling much better than previous visits. 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  ? ?Raymond [name] shares his letters that he received from [place] him that he has been approved for Medicaid. He expresses his gratitude and appreciation of this. Emotionally shares recent financial challenges due to his inability to work. States their landlord is trying to evict them, however  Raymond [name] (LCSW) is working to assist with available resources to assist him.  ? ?Pain related to neoplasm  ?Raymond [name] reports his pain has somewhat increased over the past 2 weeks. He is tolerating Oxy IR. Due to escalation in pain based on activity level discussed increasing dose as needed when pain is more severe. His goal is to be as functional as possible while also managing his pain.  ? ?We will continue to closely monitor and make needed adjustments.  ? ?Constipation  ?Improved with twice daily Miralax use however does go up to 3 days without bowel movement at times. We discussed the importance of bowel regimen to prevent further complications and in addition to the use of opioids. Encouraged water intake.  ? ?Abscess  drain in place. Site normal no inflammation or pain.  ? ?Weight loss/decreased appetite  ?Raymond [name] shares that his appetite seems to have increased over the past week. States his wife is much appreciative of this. Is tolerating mirtazapine. Drinking Ensure daily.  ? ?Weight on today is 248.8lb down from 250lbs 3/21, 249lbs on 3/14. Previous weights 267 pounds 2/23, 2/21, 2/13 268.4 lbs, 2/6 to 274 pounds.  ? ?I discussed the importance of continued conversation with family and their medical providers regarding overall plan of care and treatment options, ensuring decisions are within the context of the patients values and GOCs. ? ?PLAN: ?Oxy IR 5-10 mg every 4-6 hours as needed for pain. Will continue to closely evaluate. ?Neurontin 600 mg at bedtime, will continue to evaluate and increase dose for added support.  ?Miralax daily  ?Senna-S daily  ?Ensure daily. Encouraged increased protein and nutritional intake.  ?Continues to follow with Dr. Nelida Meuse  for ongoing support.  ?Ongoing goals of care discussions and support.  ?I will plan to see patient back in 3-4 weeks in collaboration to other oncology appointments.  ? ? ?Patient expressed understanding and was in agreement with this plan. He also understands that He can call the clinic at any time with any questions, concerns, or complaints.  ? ?Time Total: 20 min ? ?Visit consisted of counseling and education dealing with the complex and emotionally intense issues of symptom management and palliative care in the setting of serious and potentially life-threatening illness.Greater than 50%  of this time was spent counseling and coordinating care related to the above assessment and plan. ? ?[name], AGPCNP-BC  ?[place] ? ? ? ? ? ? ? ?  ?

## 2021-04-14 ENCOUNTER — Other Ambulatory Visit (HOSPITAL_COMMUNITY): Payer: Self-pay

## 2021-04-15 ENCOUNTER — Inpatient Hospital Stay: Payer: Medicaid Other

## 2021-04-15 ENCOUNTER — Encounter: Payer: Self-pay | Admitting: Licensed Clinical Social Worker

## 2021-04-15 ENCOUNTER — Other Ambulatory Visit: Payer: Self-pay

## 2021-04-15 ENCOUNTER — Encounter: Payer: Self-pay | Admitting: Internal Medicine

## 2021-04-15 VITALS — BP 137/74 | HR 83 | Temp 98.9°F | Resp 20

## 2021-04-15 DIAGNOSIS — C3412 Malignant neoplasm of upper lobe, left bronchus or lung: Secondary | ICD-10-CM

## 2021-04-15 DIAGNOSIS — Z5111 Encounter for antineoplastic chemotherapy: Secondary | ICD-10-CM | POA: Diagnosis not present

## 2021-04-15 DIAGNOSIS — C3492 Malignant neoplasm of unspecified part of left bronchus or lung: Secondary | ICD-10-CM

## 2021-04-15 MED ORDER — PEGFILGRASTIM-CBQV 6 MG/0.6ML ~~LOC~~ SOSY
6.0000 mg | PREFILLED_SYRINGE | Freq: Once | SUBCUTANEOUS | Status: AC
  Administered 2021-04-15: 6 mg via SUBCUTANEOUS
  Filled 2021-04-15: qty 0.6

## 2021-04-15 NOTE — Progress Notes (Signed)
[place] CSW Progress Note ? ?Clinical Social Worker received email confirmation from [employer] at the [employer] that a check has been dropped of at [employer] office to cover the amount pt was in arrears and bring the account positive.  Pt's next rental payment will not be due until May 5th.  Pt informed of the above. CSW to remain available as appropriate.  ? ? ? ?Raymond [name] , LCSW ?

## 2021-04-15 NOTE — Patient Instructions (Signed)

## 2021-04-17 ENCOUNTER — Encounter (HOSPITAL_COMMUNITY): Payer: Self-pay | Admitting: *Deleted

## 2021-04-17 ENCOUNTER — Other Ambulatory Visit: Payer: Self-pay

## 2021-04-17 ENCOUNTER — Emergency Department (HOSPITAL_COMMUNITY): Payer: Medicaid Other

## 2021-04-17 ENCOUNTER — Emergency Department (HOSPITAL_COMMUNITY)
Admission: EM | Admit: 2021-04-17 | Discharge: 2021-04-17 | Disposition: A | Discharge status: Home / Self Care | Payer: Medicaid Other | Attending: Emergency Medicine | Admitting: Emergency Medicine

## 2021-04-17 DIAGNOSIS — I10 Essential (primary) hypertension: Secondary | ICD-10-CM | POA: Diagnosis not present

## 2021-04-17 DIAGNOSIS — C3490 Malignant neoplasm of unspecified part of unspecified bronchus or lung: Secondary | ICD-10-CM

## 2021-04-17 DIAGNOSIS — R1032 Left lower quadrant pain: Secondary | ICD-10-CM

## 2021-04-17 DIAGNOSIS — K5732 Diverticulitis of large intestine without perforation or abscess without bleeding: Secondary | ICD-10-CM | POA: Insufficient documentation

## 2021-04-17 DIAGNOSIS — D649 Anemia, unspecified: Secondary | ICD-10-CM | POA: Insufficient documentation

## 2021-04-17 DIAGNOSIS — Z7951 Long term (current) use of inhaled steroids: Secondary | ICD-10-CM | POA: Diagnosis not present

## 2021-04-17 DIAGNOSIS — C7931 Secondary malignant neoplasm of brain: Secondary | ICD-10-CM | POA: Diagnosis not present

## 2021-04-17 DIAGNOSIS — C3432 Malignant neoplasm of lower lobe, left bronchus or lung: Secondary | ICD-10-CM | POA: Diagnosis not present

## 2021-04-17 DIAGNOSIS — Z79899 Other long term (current) drug therapy: Secondary | ICD-10-CM | POA: Diagnosis not present

## 2021-04-17 DIAGNOSIS — D72829 Elevated white blood cell count, unspecified: Secondary | ICD-10-CM | POA: Insufficient documentation

## 2021-04-17 DIAGNOSIS — K5792 Diverticulitis of intestine, part unspecified, without perforation or abscess without bleeding: Secondary | ICD-10-CM

## 2021-04-17 LAB — LACTIC ACID, PLASMA: Lactic Acid, Venous: 1.5 mmol/L (ref 0.5–1.9)

## 2021-04-17 LAB — COMPREHENSIVE METABOLIC PANEL
ALT: 18 U/L (ref 0–44)
AST: 26 U/L (ref 15–41)
Albumin: 3.2 g/dL — ABNORMAL LOW (ref 3.5–5.0)
Alkaline Phosphatase: 106 U/L (ref 38–126)
Anion gap: 7 (ref 5–15)
BUN: 11 mg/dL (ref 6–20)
CO2: 24 mmol/L (ref 22–32)
Calcium: 8.7 mg/dL — ABNORMAL LOW (ref 8.9–10.3)
Chloride: 108 mmol/L (ref 98–111)
Creatinine, Ser: 0.69 mg/dL (ref 0.61–1.24)
GFR, Estimated: >60 mL/min (ref >60)
Glucose, Bld: 102 mg/dL — ABNORMAL HIGH (ref 70–99)
Potassium: 3.7 mmol/L (ref 3.5–5.1)
Sodium: 139 mmol/L (ref 135–145)
Total Bilirubin: 0.7 mg/dL (ref 0.3–1.2)
Total Protein: 6.3 g/dL — ABNORMAL LOW (ref 6.5–8.1)

## 2021-04-17 LAB — CBC
HCT: 35.4 % — ABNORMAL LOW (ref 39.0–52.0)
Hemoglobin: 11.3 g/dL — ABNORMAL LOW (ref 13.0–17.0)
MCH: 29.8 pg (ref 26.0–34.0)
MCHC: 31.9 g/dL (ref 30.0–36.0)
MCV: 93.4 fL (ref 80.0–100.0)
Platelets: 168 10*3/uL (ref 150–400)
RBC: 3.79 MIL/uL — ABNORMAL LOW (ref 4.22–5.81)
RDW: 14.2 % (ref 11.5–15.5)
WBC: 12.6 10*3/uL — ABNORMAL HIGH (ref 4.0–10.5)
nRBC: 0 % (ref 0.0–0.2)

## 2021-04-17 LAB — URINALYSIS, ROUTINE W REFLEX MICROSCOPIC
Bilirubin Urine: NEGATIVE
Glucose, UA: NEGATIVE mg/dL
Hgb urine dipstick: NEGATIVE
Ketones, ur: NEGATIVE mg/dL
Leukocytes,Ua: NEGATIVE
Nitrite: NEGATIVE
Protein, ur: NEGATIVE mg/dL
Specific Gravity, Urine: 1.021 (ref 1.005–1.030)
pH: 5 (ref 5.0–8.0)

## 2021-04-17 MED ORDER — IOHEXOL 300 MG/ML  SOLN
100.0000 mL | Freq: Once | INTRAMUSCULAR | Status: AC | PRN
  Administered 2021-04-17: 100 mL via INTRAVENOUS

## 2021-04-17 MED ORDER — FENTANYL CITRATE PF 50 MCG/ML IJ SOSY
50.0000 ug | PREFILLED_SYRINGE | Freq: Once | INTRAMUSCULAR | Status: AC
  Administered 2021-04-17: 50 ug via INTRAVENOUS
  Filled 2021-04-17: qty 1

## 2021-04-17 MED ORDER — SODIUM CHLORIDE (PF) 0.9 % IJ SOLN
INTRAMUSCULAR | Status: AC
  Filled 2021-04-17: qty 50

## 2021-04-17 MED ORDER — AMOXICILLIN-POT CLAVULANATE 875-125 MG PO TABS
1.0000 | ORAL_TABLET | Freq: Two times a day (BID) | ORAL | 0 refills | Status: DC
Start: 2021-04-17 — End: 2021-05-03
  Filled 2021-04-17: qty 14, 7d supply, fill #0

## 2021-04-17 MED ORDER — OXYCODONE-ACETAMINOPHEN 5-325 MG PO TABS
1.0000 | ORAL_TABLET | Freq: Three times a day (TID) | ORAL | 0 refills | Status: DC | PRN
  Filled 2021-04-17: qty 15, 5d supply, fill #0

## 2021-04-17 NOTE — ED Provider Triage Note (Signed)
Emergency Medicine Provider Triage Evaluation Note ? ?Raymond [name] , a 61 y.o. male  was evaluated in triage.  Pt complains of pain from drainage site.  He has a left lower quadrant drain after a fluid collection, and this is placed by interventional radiology.  He states he has had more pain around the site and feels "burning on the inside", and woke up this morning with the drainage bag full of air.  States this is never happened before. ? ?Hx of stage 4 lung cancer with brain mets ? ?Review of Systems  ?Positive: Abdominal pain at drain site ?Negative: Fever, vomiting ? ?Physical Exam  ?BP 128/87 (BP Location: Left Arm)   Pulse 93   Temp 98.3 ?F (36.8 ?C) (Oral)   Resp 18   Ht 5\' 11"  (1.803 m)   Wt 112.5 kg   SpO2 98%   BMI 34.59 kg/m?  ?Gen:   Awake, no distress   ?Resp:  Normal effort  ?MSK:   Moves extremities without difficulty  ?Other:   ? ?Medical Decision Making  ?Medically screening exam initiated at 2:19 PM.  Appropriate orders placed.  Raymond [name] was informed that the remainder of the evaluation will be completed by another provider, this initial triage assessment does not replace that evaluation, and the importance of remaining in the ED until their evaluation is complete. ? ? ?  ?Kateri Plummer, PA-C ?04/17/21 1419 ? ?

## 2021-04-17 NOTE — Discharge Instructions (Addendum)
It is ok to take an extra opiate for abdominal pain in addition to your current regimen. A prescription for Augmentin has been sent as well. Your CT scan revealed the following today: ?IMPRESSION:  ?1. Percutaneous pigtail drainage catheter tip in the left lower  ?quadrant adjacent to the colon. No residual defined fluid collection  ?visualized.  ?2. Colonic diverticulosis. Improving mild wall thickening and  ?adjacent fat stranding at the proximal sigmoid colon and distal  ?descending colon.  ?3. Innumerable pulmonary nodules again seen.  ?4. Stable thickening of the left adrenal gland, indeterminate.  ?5. Cholelithiasis mild wall thickening, stable.  ?6. Left renal calculus.  ? ?

## 2021-04-17 NOTE — ED Provider Notes (Signed)
?[facility] DEPT ?Provider Note ? ? ?CSN: [id number] ?Arrival date & time: 04/17/21  1351 ? ?  ? ?History ? ?Chief Complaint  ?Patient presents with  ? Abdominal Pain  ? ? ?TANNON PEERSON is a 61 y.o. male. ? ? ?Abdominal Pain ? ? 61 y.o. male with medical history including stage IV non-small cell lung cancer, adenocarcinoma of the left lower lobe, mediastinal lymphadenopathy with brain metastasis, s/p 5 cycles of carboplatin, Keytruda, and Alimta, stereotactic radiosurgery to brain mets, hypertension, and GERD who presents to the ED with worsening abdominal pain. The patient denies fevers, chills, nausea, vomiting. He endorses worsening LLQ abdominal pain despite his home opiates. He recently finished out a course of Augmentin after having a drain placed in his LLQ for persistent fluid collection associated with a diverticular abscess.  ? 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  Patient called today reporting leakage from drain/broken catheter hub.  He was instructed to return to IR for drain exchange today.  Drain exam today does reveal evidence of a broken/detached hub.  Details/risks of procedure discussed with patient with his apparent understanding and consent.  As outpatient, he was instructed to flush drain daily with 5 cc sterile saline, record output and change dressing as needed.  He will be scheduled for follow-up drain injection in the IR clinic in 2-3.  He was told to contact our service with any additional questions or concerns." ? ?Home Medications ?Prior to Admission medications   ?Medication Sig Start Date End Date Taking? Authorizing Provider  ?amoxicillin-clavulanate (AUGMENTIN) 875-125 MG tablet Take 1 tablet by mouth every 12 (twelve) hours. 04/17/21  Yes Regan Lemming, MD  ?oxyCODONE-acetaminophen (PERCOCET/ROXICET)  5-325 MG tablet Take 1 tablet by mouth every 8 (eight) hours as needed for severe pain. 04/17/21  Yes Regan Lemming, MD  ?acetaminophen (TYLENOL) 650 MG CR tablet Take 650 mg by mouth every 8 (eight) hours as needed for pain.    [provider]  ?amLODipine (NORVASC) 10 MG tablet Take 1 tablet by mouth daily. 03/10/21   Mercy Riding, MD  ?dexamethasone (DECADRON) 4 MG tablet Please take 2 tablets 2 times a day the day before, the day of, and the day after treatment. 02/15/21   [name], [name], PA-C  ?Ensure Plus (ENSURE PLUS) LIQD Take 237 mLs by mouth 3 (three) times daily between meals.    [provider]  ?fluconazole (DIFLUCAN) 100 MG tablet Take 1 tablet (100 mg total) by mouth daily. 04/13/21   [name], [name], PA-C  ?fluticasone (FLONASE) 50 MCG/ACT nasal spray Place 1 spray into both nostrils daily. 03/16/21   Pickenpack-Cousar, Carlena Sax, NP  ?gabapentin (NEURONTIN) 300 MG capsule Take 2 capsules (600 mg total) by mouth at bedtime. 04/13/21 05/13/21  Pickenpack-Cousar, Carlena Sax, NP  ?guaiFENesin (MUCINEX) 600 MG 12 hr tablet Take 600 mg by mouth 2 (two) times daily.    [provider]  ?memantine (NAMENDA) 10 MG tablet Take 1 tablet (10 mg total) by mouth 2 (two) times daily. 01/26/21   [name], PA-C  ?mirtazapine (REMERON) 30 MG tablet Take 1 tablet (30 mg total) by mouth at bedtime. 04/13/21   [name], [name], PA-C  ?Misc Natural Products (OSTEO BI-FLEX ADV JOINT SHIELD) TABS Take 1 tablet by mouth daily. ?Patient not taking: Reported on 03/16/2021    [provider]  ?  Multiple Vitamin (MULTIVITAMIN WITH MINERALS) TABS tablet Take 1 tablet by mouth daily. 03/10/21   Mercy Riding, MD  ?ondansetron (ZOFRAN) 8 MG tablet Take 1 tablet (8 mg total) by mouth every 8 (eight) hours as needed for nausea or vomiting. 04/13/21   Pickenpack-Cousar, Carlena Sax, NP  ?oxyCODONE (OXY IR/ROXICODONE) 5 MG immediate release tablet Take 1-2 tablets (5-10  mg total) by mouth every 6 (six) hours as needed for severe pain. 04/13/21   Pickenpack-Cousar, Carlena Sax, NP  ?prochlorperazine (COMPAZINE) 10 MG tablet Take 1 tablet (10 mg total) by mouth every 6 (six) hours as needed for nausea or vomiting. 02/25/21   Pickenpack-Cousar, Carlena Sax, NP  ?senna-docusate (SENNA S) 8.6-50 MG tablet Take 2 tablets by mouth daily. 04/13/21   Pickenpack-Cousar, Carlena Sax, NP  ?tamsulosin (FLOMAX) 0.4 MG CAPS capsule Take 1 capsule (0.4 mg total) by mouth daily after supper. 01/27/21   [name], MD  ?Wheat Dextrin (BENEFIBER DRINK MIX) PACK Take 1 Package by mouth daily at 6 (six) AM.    [provider]  ?   ? ?Allergies    ?Benadryl [diphenhydramine]   ? ?Review of Systems   ?Review of Systems  ?Gastrointestinal:  Positive for abdominal pain.  ?All other systems reviewed and are negative. ? ?Physical Exam ?Updated Vital Signs ?BP 128/72   Pulse 88   Temp 98.3 ?F (36.8 ?C) (Oral)   Resp 16   Ht 5\' 11"  (1.803 m)   Wt 112.5 kg   SpO2 97%   BMI 34.59 kg/m?  ?Physical Exam ?Vitals and nursing note reviewed.  ?Constitutional:   ?   General: He is not in acute distress. ?HENT:  ?   Head: Normocephalic and atraumatic.  ?Eyes:  ?   Conjunctiva/sclera: Conjunctivae normal.  ?   Pupils: Pupils are equal, round, and reactive to light.  ?Cardiovascular:  ?   Rate and Rhythm: Normal rate and regular rhythm.  ?Pulmonary:  ?   Effort: Pulmonary effort is normal. No respiratory distress.  ?Abdominal:  ?   General: There is no distension.  ?   Tenderness: There is abdominal tenderness in the left lower quadrant. There is no guarding or rebound.  ?   Comments: LLQ percutaneous drain in place with no discharge, surrounding erythema, mild surrounding TTP  ?Musculoskeletal:     ?   General: No deformity or signs of injury.  ?   Cervical back: Neck supple.  ?Skin: ?   Findings: No lesion or rash.  ?Neurological:  ?   General: No focal deficit present.  ?   Mental Status: He is alert.  Mental status is at baseline.  ? ? ?ED Results / Procedures / Treatments   ?Labs ?(all labs ordered are listed, but only abnormal results are displayed) ?Labs Reviewed  ?COMPREHENSIVE METABOLIC PANEL - Abnormal; Notable for the following components:  ?    Result Value  ? Glucose, Bld 102 (*)   ? Calcium 8.7 (*)   ? Total Protein 6.3 (*)   ? Albumin 3.2 (*)   ? All other components within normal limits  ?CBC - Abnormal; Notable for the following components:  ? WBC 12.6 (*)   ? RBC 3.79 (*)   ? Hemoglobin 11.3 (*)   ? HCT 35.4 (*)   ? All other components within normal limits  ?URINALYSIS, ROUTINE W REFLEX MICROSCOPIC  ?LACTIC ACID, PLASMA  ? ? ?EKG ?None ? ?Radiology ?CT ABDOMEN PELVIS W CONTRAST ? ?Result Date:  04/17/2021 ?CLINICAL DATA:  Left lower quadrant pain EXAM: CT ABDOMEN AND PELVIS WITH CONTRAST TECHNIQUE: Multidetector CT imaging of the abdomen and pelvis was performed using the standard protocol following bolus administration of intravenous contrast. RADIATION DOSE REDUCTION: This exam was performed according to the departmental dose-optimization program which includes automated exposure control, adjustment of the mA and/or kV according to patient size and/or use of iterative reconstruction technique. CONTRAST:  139mL OMNIPAQUE IOHEXOL 300 MG/ML  SOLN COMPARISON:  CT abdomen and pelvis 03/18/2021 FINDINGS: Lower chest: Innumerable pulmonary nodules are again seen in the lower lungs which measure up to 9 mm in size trauma grossly similar to previous study. Hepatobiliary: Liver is normal in size and contour with no focal mass visualized on the current study. Cholelithiasis with mild wall thickening similar to previous study. No pericholecystic edema. No biliary ductal dilatation identified. Pancreas: Unremarkable. No pancreatic ductal dilatation or surrounding inflammatory changes. Spleen: Normal in size without focal abnormality. Adrenals/Urinary Tract: Stable thickening of the left adrenal gland measuring  up to 11 mm thickness. Right adrenal gland appears normal. 2 mm calculus in the mid left kidney. Kidneys appear otherwise normal. Urinary bladder is incompletely distended and not well evaluated. Stomach/Bowel: A l

## 2021-04-17 NOTE — ED Triage Notes (Signed)
Pt has tube in lower left abd that is draining, states it is burning and has had a lot of air in the bag. Unclear as to type of tube.  ?

## 2021-04-19 ENCOUNTER — Other Ambulatory Visit (HOSPITAL_COMMUNITY): Payer: Self-pay

## 2021-04-19 ENCOUNTER — Other Ambulatory Visit: Payer: Self-pay

## 2021-04-19 ENCOUNTER — Encounter: Payer: Self-pay | Admitting: Internal Medicine

## 2021-04-19 ENCOUNTER — Inpatient Hospital Stay: Payer: Medicaid Other

## 2021-04-19 DIAGNOSIS — Z5111 Encounter for antineoplastic chemotherapy: Secondary | ICD-10-CM | POA: Diagnosis not present

## 2021-04-19 DIAGNOSIS — C3492 Malignant neoplasm of unspecified part of left bronchus or lung: Secondary | ICD-10-CM

## 2021-04-19 LAB — CBC WITH DIFFERENTIAL (CANCER CENTER ONLY)
Abs Immature Granulocytes: 0.03 10*3/uL (ref 0.00–0.07)
Basophils Absolute: 0.1 10*3/uL (ref 0.0–0.1)
Basophils Relative: 2 %
Eosinophils Absolute: 0 10*3/uL (ref 0.0–0.5)
Eosinophils Relative: 1 %
HCT: 32.4 % — ABNORMAL LOW (ref 39.0–52.0)
Hemoglobin: 10.4 g/dL — ABNORMAL LOW (ref 13.0–17.0)
Immature Granulocytes: 1 %
Lymphocytes Relative: 44 %
Lymphs Abs: 1 10*3/uL (ref 0.7–4.0)
MCH: 29.3 pg (ref 26.0–34.0)
MCHC: 32.1 g/dL (ref 30.0–36.0)
MCV: 91.3 fL (ref 80.0–100.0)
Monocytes Absolute: 0.3 10*3/uL (ref 0.1–1.0)
Monocytes Relative: 13 %
Neutro Abs: 0.9 10*3/uL — ABNORMAL LOW (ref 1.7–7.7)
Neutrophils Relative %: 39 %
Platelet Count: 182 10*3/uL (ref 150–400)
RBC: 3.55 MIL/uL — ABNORMAL LOW (ref 4.22–5.81)
RDW: 13.9 % (ref 11.5–15.5)
WBC Count: 2.2 10*3/uL — ABNORMAL LOW (ref 4.0–10.5)
nRBC: 0 % (ref 0.0–0.2)

## 2021-04-19 LAB — CMP (CANCER CENTER ONLY)
ALT: 13 U/L (ref 0–44)
AST: 19 U/L (ref 15–41)
Albumin: 3.4 g/dL — ABNORMAL LOW (ref 3.5–5.0)
Alkaline Phosphatase: 101 U/L (ref 38–126)
Anion gap: 6 (ref 5–15)
BUN: 8 mg/dL (ref 6–20)
CO2: 29 mmol/L (ref 22–32)
Calcium: 9 mg/dL (ref 8.9–10.3)
Chloride: 105 mmol/L (ref 98–111)
Creatinine: 0.71 mg/dL (ref 0.61–1.24)
GFR, Estimated: >60 mL/min (ref >60)
Glucose, Bld: 103 mg/dL — ABNORMAL HIGH (ref 70–99)
Potassium: 3.7 mmol/L (ref 3.5–5.1)
Sodium: 140 mmol/L (ref 135–145)
Total Bilirubin: 0.7 mg/dL (ref 0.3–1.2)
Total Protein: 6.3 g/dL — ABNORMAL LOW (ref 6.5–8.1)

## 2021-04-22 ENCOUNTER — Other Ambulatory Visit (HOSPITAL_COMMUNITY): Payer: Self-pay

## 2021-04-26 ENCOUNTER — Ambulatory Visit: Payer: 59 | Admitting: Internal Medicine

## 2021-04-26 ENCOUNTER — Ambulatory Visit: Payer: 59

## 2021-04-26 ENCOUNTER — Other Ambulatory Visit: Payer: 59

## 2021-04-27 ENCOUNTER — Inpatient Hospital Stay: Payer: Medicaid Other

## 2021-04-27 ENCOUNTER — Other Ambulatory Visit: Payer: Self-pay

## 2021-04-27 ENCOUNTER — Encounter: Payer: Self-pay | Admitting: *Deleted

## 2021-04-27 ENCOUNTER — Inpatient Hospital Stay (HOSPITAL_COMMUNITY)
Admission: EM | Admit: 2021-04-27 | Discharge: 2021-05-03 | DRG: 603 | Disposition: A | Discharge status: Home Health | Payer: Medicaid Other | Attending: Internal Medicine | Admitting: Internal Medicine

## 2021-04-27 ENCOUNTER — Encounter (HOSPITAL_COMMUNITY): Payer: Self-pay

## 2021-04-27 ENCOUNTER — Emergency Department (HOSPITAL_COMMUNITY): Payer: Medicaid Other

## 2021-04-27 DIAGNOSIS — I10 Essential (primary) hypertension: Secondary | ICD-10-CM | POA: Diagnosis present

## 2021-04-27 DIAGNOSIS — Z801 Family history of malignant neoplasm of trachea, bronchus and lung: Secondary | ICD-10-CM

## 2021-04-27 DIAGNOSIS — R1032 Left lower quadrant pain: Principal | ICD-10-CM

## 2021-04-27 DIAGNOSIS — K5720 Diverticulitis of large intestine with perforation and abscess without bleeding: Secondary | ICD-10-CM | POA: Diagnosis present

## 2021-04-27 DIAGNOSIS — Z6835 Body mass index (BMI) 35.0-35.9, adult: Secondary | ICD-10-CM

## 2021-04-27 DIAGNOSIS — F32A Depression, unspecified: Secondary | ICD-10-CM | POA: Diagnosis present

## 2021-04-27 DIAGNOSIS — L03311 Cellulitis of abdominal wall: Principal | ICD-10-CM | POA: Diagnosis present

## 2021-04-27 DIAGNOSIS — Z79899 Other long term (current) drug therapy: Secondary | ICD-10-CM

## 2021-04-27 DIAGNOSIS — C3492 Malignant neoplasm of unspecified part of left bronchus or lung: Secondary | ICD-10-CM

## 2021-04-27 DIAGNOSIS — N400 Enlarged prostate without lower urinary tract symptoms: Secondary | ICD-10-CM | POA: Diagnosis present

## 2021-04-27 DIAGNOSIS — I252 Old myocardial infarction: Secondary | ICD-10-CM

## 2021-04-27 DIAGNOSIS — C7951 Secondary malignant neoplasm of bone: Secondary | ICD-10-CM | POA: Diagnosis present

## 2021-04-27 DIAGNOSIS — C7931 Secondary malignant neoplasm of brain: Secondary | ICD-10-CM | POA: Diagnosis present

## 2021-04-27 DIAGNOSIS — R109 Unspecified abdominal pain: Secondary | ICD-10-CM | POA: Diagnosis present

## 2021-04-27 DIAGNOSIS — K8020 Calculus of gallbladder without cholecystitis without obstruction: Secondary | ICD-10-CM | POA: Diagnosis present

## 2021-04-27 DIAGNOSIS — K219 Gastro-esophageal reflux disease without esophagitis: Secondary | ICD-10-CM | POA: Diagnosis present

## 2021-04-27 DIAGNOSIS — G4733 Obstructive sleep apnea (adult) (pediatric): Secondary | ICD-10-CM | POA: Diagnosis present

## 2021-04-27 DIAGNOSIS — I700 Atherosclerosis of aorta: Secondary | ICD-10-CM | POA: Diagnosis present

## 2021-04-27 DIAGNOSIS — Z8249 Family history of ischemic heart disease and other diseases of the circulatory system: Secondary | ICD-10-CM

## 2021-04-27 DIAGNOSIS — F17210 Nicotine dependence, cigarettes, uncomplicated: Secondary | ICD-10-CM | POA: Diagnosis present

## 2021-04-27 DIAGNOSIS — G893 Neoplasm related pain (acute) (chronic): Secondary | ICD-10-CM | POA: Diagnosis present

## 2021-04-27 DIAGNOSIS — K5903 Drug induced constipation: Secondary | ICD-10-CM | POA: Diagnosis present

## 2021-04-27 DIAGNOSIS — E669 Obesity, unspecified: Secondary | ICD-10-CM | POA: Diagnosis present

## 2021-04-27 DIAGNOSIS — Z9221 Personal history of antineoplastic chemotherapy: Secondary | ICD-10-CM

## 2021-04-27 DIAGNOSIS — Z923 Personal history of irradiation: Secondary | ICD-10-CM

## 2021-04-27 DIAGNOSIS — D849 Immunodeficiency, unspecified: Secondary | ICD-10-CM | POA: Diagnosis present

## 2021-04-27 DIAGNOSIS — T402X5A Adverse effect of other opioids, initial encounter: Secondary | ICD-10-CM | POA: Diagnosis present

## 2021-04-27 DIAGNOSIS — K632 Fistula of intestine: Secondary | ICD-10-CM | POA: Diagnosis present

## 2021-04-27 LAB — CMP (CANCER CENTER ONLY)
ALT: 9 U/L (ref 0–44)
AST: 17 U/L (ref 15–41)
Albumin: 3.6 g/dL (ref 3.5–5.0)
Alkaline Phosphatase: 113 U/L (ref 38–126)
Anion gap: 10 (ref 5–15)
BUN: 11 mg/dL (ref 6–20)
CO2: 27 mmol/L (ref 22–32)
Calcium: 9.1 mg/dL (ref 8.9–10.3)
Chloride: 104 mmol/L (ref 98–111)
Creatinine: 0.84 mg/dL (ref 0.61–1.24)
GFR, Estimated: >60 mL/min (ref >60)
Glucose, Bld: 105 mg/dL — ABNORMAL HIGH (ref 70–99)
Potassium: 4 mmol/L (ref 3.5–5.1)
Sodium: 141 mmol/L (ref 135–145)
Total Bilirubin: 0.4 mg/dL (ref 0.3–1.2)
Total Protein: 6.6 g/dL (ref 6.5–8.1)

## 2021-04-27 LAB — CBC WITH DIFFERENTIAL/PLATELET
Abs Immature Granulocytes: 0.06 10*3/uL (ref 0.00–0.07)
Basophils Absolute: 0.1 10*3/uL (ref 0.0–0.1)
Basophils Relative: 0 %
Eosinophils Absolute: 0 10*3/uL (ref 0.0–0.5)
Eosinophils Relative: 0 %
HCT: 37.3 % — ABNORMAL LOW (ref 39.0–52.0)
Hemoglobin: 11.7 g/dL — ABNORMAL LOW (ref 13.0–17.0)
Immature Granulocytes: 1 %
Lymphocytes Relative: 16 %
Lymphs Abs: 1.9 10*3/uL (ref 0.7–4.0)
MCH: 29 pg (ref 26.0–34.0)
MCHC: 31.4 g/dL (ref 30.0–36.0)
MCV: 92.6 fL (ref 80.0–100.0)
Monocytes Absolute: 0.7 10*3/uL (ref 0.1–1.0)
Monocytes Relative: 6 %
Neutro Abs: 9 10*3/uL — ABNORMAL HIGH (ref 1.7–7.7)
Neutrophils Relative %: 77 %
Platelets: 270 10*3/uL (ref 150–400)
RBC: 4.03 MIL/uL — ABNORMAL LOW (ref 4.22–5.81)
RDW: 14.6 % (ref 11.5–15.5)
WBC: 11.8 10*3/uL — ABNORMAL HIGH (ref 4.0–10.5)
nRBC: 0 % (ref 0.0–0.2)

## 2021-04-27 LAB — COMPREHENSIVE METABOLIC PANEL
ALT: 14 U/L (ref 0–44)
AST: 21 U/L (ref 15–41)
Albumin: 3.5 g/dL (ref 3.5–5.0)
Alkaline Phosphatase: 113 U/L (ref 38–126)
Anion gap: 8 (ref 5–15)
BUN: 12 mg/dL (ref 6–20)
CO2: 26 mmol/L (ref 22–32)
Calcium: 9.4 mg/dL (ref 8.9–10.3)
Chloride: 105 mmol/L (ref 98–111)
Creatinine, Ser: 0.96 mg/dL (ref 0.61–1.24)
GFR, Estimated: >60 mL/min (ref >60)
Glucose, Bld: 100 mg/dL — ABNORMAL HIGH (ref 70–99)
Potassium: 3.8 mmol/L (ref 3.5–5.1)
Sodium: 139 mmol/L (ref 135–145)
Total Bilirubin: 0.8 mg/dL (ref 0.3–1.2)
Total Protein: 7.2 g/dL (ref 6.5–8.1)

## 2021-04-27 LAB — CBC WITH DIFFERENTIAL (CANCER CENTER ONLY)
Abs Immature Granulocytes: 0.04 10*3/uL (ref 0.00–0.07)
Basophils Absolute: 0 10*3/uL (ref 0.0–0.1)
Basophils Relative: 0 %
Eosinophils Absolute: 0 10*3/uL (ref 0.0–0.5)
Eosinophils Relative: 0 %
HCT: 34.2 % — ABNORMAL LOW (ref 39.0–52.0)
Hemoglobin: 10.8 g/dL — ABNORMAL LOW (ref 13.0–17.0)
Immature Granulocytes: 0 %
Lymphocytes Relative: 16 %
Lymphs Abs: 1.4 10*3/uL (ref 0.7–4.0)
MCH: 28.6 pg (ref 26.0–34.0)
MCHC: 31.6 g/dL (ref 30.0–36.0)
MCV: 90.5 fL (ref 80.0–100.0)
Monocytes Absolute: 0.6 10*3/uL (ref 0.1–1.0)
Monocytes Relative: 6 %
Neutro Abs: 6.9 10*3/uL (ref 1.7–7.7)
Neutrophils Relative %: 78 %
Platelet Count: 244 10*3/uL (ref 150–400)
RBC: 3.78 MIL/uL — ABNORMAL LOW (ref 4.22–5.81)
RDW: 14.6 % (ref 11.5–15.5)
WBC Count: 9.1 10*3/uL (ref 4.0–10.5)
nRBC: 0 % (ref 0.0–0.2)

## 2021-04-27 LAB — LACTIC ACID, PLASMA: Lactic Acid, Venous: 1.3 mmol/L (ref 0.5–1.9)

## 2021-04-27 MED ORDER — ONDANSETRON HCL 4 MG/2ML IJ SOLN
4.0000 mg | Freq: Once | INTRAMUSCULAR | Status: AC
  Administered 2021-04-27: 4 mg via INTRAVENOUS
  Filled 2021-04-27: qty 2

## 2021-04-27 MED ORDER — IOHEXOL 300 MG/ML  SOLN
100.0000 mL | Freq: Once | INTRAMUSCULAR | Status: AC | PRN
  Administered 2021-04-27: 100 mL via INTRAVENOUS

## 2021-04-27 MED ORDER — HYDROMORPHONE HCL 1 MG/ML IJ SOLN
1.0000 mg | Freq: Once | INTRAMUSCULAR | Status: AC
  Administered 2021-04-27: 1 mg via INTRAVENOUS
  Filled 2021-04-27: qty 1

## 2021-04-27 MED ORDER — SODIUM CHLORIDE 0.9 % IV BOLUS
1000.0000 mL | Freq: Once | INTRAVENOUS | Status: AC
Start: 2021-04-27 — End: 2021-04-28
  Administered 2021-04-27: 1000 mL via INTRAVENOUS

## 2021-04-27 NOTE — ED Notes (Signed)
Patient transported to CT 

## 2021-04-27 NOTE — ED Triage Notes (Signed)
Pt has a bag to his LLQ that is draining an abscess. Pt reports increased drainage, odor, and pain over the past few days.  ?

## 2021-04-27 NOTE — ED Provider Notes (Signed)
?[facility] DEPT ?Provider Note ? ? ?CSN: [id number] ?Arrival date & time: 04/27/21  2031 ? ?  ? ?History ? ?Chief Complaint  ?Patient presents with  ? Abscess  ? ? ?Raymond [name] is a 61 y.o. male. ? ?61 yo M with a chief complaints of left lower quadrant abdominal pain.  The patient had a drain placed for what he describes as perforated diverticulitis.  Had been doing okay but over the past week he has developed some subjective chills some worsening abdominal pain and recurrent drainage.  Has had some trouble eating and drinking but thought it could be due to chemotherapy. ? ? ?Abscess ? ?  ? ?Home Medications ?Prior to Admission medications   ?Medication Sig Start Date End Date Taking? Authorizing Provider  ?acetaminophen (TYLENOL) 650 MG CR tablet Take 650 mg by mouth every 8 (eight) hours as needed for pain.    [provider]  ?amLODipine (NORVASC) 10 MG tablet Take 1 tablet by mouth daily. 03/10/21   Mercy Riding, MD  ?amoxicillin-clavulanate (AUGMENTIN) 875-125 MG tablet Take 1 tablet by mouth every 12 (twelve) hours. 04/17/21   Regan Lemming, MD  ?dexamethasone (DECADRON) 4 MG tablet Please take 2 tablets 2 times a day the day before, the day of, and the day after treatment. 02/15/21   [name], [name], PA-C  ?Ensure Plus (ENSURE PLUS) LIQD Take 237 mLs by mouth 3 (three) times daily between meals.    [provider]  ?fluconazole (DIFLUCAN) 100 MG tablet Take 1 tablet (100 mg total) by mouth daily. 04/13/21   [name], [name], PA-C  ?fluticasone (FLONASE) 50 MCG/ACT nasal spray Place 1 spray into both nostrils daily. 03/16/21   Pickenpack-Cousar, Carlena Sax, NP  ?gabapentin (NEURONTIN) 300 MG capsule Take 2 capsules (600 mg total) by mouth at bedtime. 04/13/21 05/13/21  Pickenpack-Cousar, Carlena Sax, NP  ?guaiFENesin (MUCINEX) 600 MG 12 hr tablet Take 600 mg by mouth 2 (two) times daily.    [provider]  ?memantine (NAMENDA) 10 MG  tablet Take 1 tablet (10 mg total) by mouth 2 (two) times daily. 01/26/21   [name], PA-C  ?mirtazapine (REMERON) 30 MG tablet Take 1 tablet (30 mg total) by mouth at bedtime. 04/13/21   [name], [name], PA-C  ?Misc Natural Products (OSTEO BI-FLEX ADV JOINT SHIELD) TABS Take 1 tablet by mouth daily. ?Patient not taking: Reported on 03/16/2021    [provider]  ?Multiple Vitamin (MULTIVITAMIN WITH MINERALS) TABS tablet Take 1 tablet by mouth daily. 03/10/21   Mercy Riding, MD  ?ondansetron (ZOFRAN) 8 MG tablet Take 1 tablet (8 mg total) by mouth every 8 (eight) hours as needed for nausea or vomiting. 04/13/21   Pickenpack-Cousar, Carlena Sax, NP  ?oxyCODONE (OXY IR/ROXICODONE) 5 MG immediate release tablet Take 1-2 tablets (5-10 mg total) by mouth every 6 (six) hours as needed for severe pain. 04/13/21   Pickenpack-Cousar, Carlena Sax, NP  ?oxyCODONE-acetaminophen (PERCOCET/ROXICET) 5-325 MG tablet Take 1 tablet by mouth every 8 (eight) hours as needed for severe pain. 04/17/21   Regan Lemming, MD  ?prochlorperazine (COMPAZINE) 10 MG tablet Take 1 tablet (10 mg total) by mouth every 6 (six) hours as needed for nausea or vomiting. 02/25/21   Pickenpack-Cousar, Carlena Sax, NP  ?senna-docusate (SENNA S) 8.6-50 MG tablet Take 2 tablets by mouth daily. 04/13/21   Pickenpack-Cousar, Carlena Sax, NP  ?tamsulosin (FLOMAX) 0.4 MG CAPS capsule Take 1 capsule (0.4 mg total) by mouth daily  after supper. 01/27/21   [name], MD  ?Wheat Dextrin (BENEFIBER DRINK MIX) PACK Take 1 Package by mouth daily at 6 (six) AM.    [provider]  ?   ? ?Allergies    ?Benadryl [diphenhydramine]   ? ?Review of Systems   ?Review of Systems ? ?Physical Exam ?Updated Vital Signs ?BP 116/67   Pulse 83   Temp 98.4 ?F (36.9 ?C) (Oral)   Resp 13   Ht 5\' 11"  (1.803 m)   Wt 108 kg   SpO2 94%   BMI 33.19 kg/m?  ?Physical Exam ?Vitals and nursing note reviewed.  ?Constitutional:   ?   Appearance: He is  well-developed.  ?HENT:  ?   Head: Normocephalic and atraumatic.  ?Eyes:  ?   Pupils: Pupils are equal, round, and reactive to light.  ?Neck:  ?   Vascular: No JVD.  ?Cardiovascular:  ?   Rate and Rhythm: Normal rate and regular rhythm.  ?   Heart sounds: No murmur heard. ?  No friction rub. No gallop.  ?Pulmonary:  ?   Effort: No respiratory distress.  ?   Breath sounds: No wheezing.  ?Abdominal:  ?   General: There is no distension.  ?   Tenderness: There is abdominal tenderness. There is no guarding or rebound.  ?   Comments: Bloody appearing drainage from the left intra-abdominal drain.  Tenderness worse to the left lower quadrant.  ?Musculoskeletal:     ?   General: Normal range of motion.  ?   Cervical back: Normal range of motion and neck supple.  ?Skin: ?   Coloration: Skin is not pale.  ?   Findings: No rash.  ?Neurological:  ?   Mental Status: He is alert and oriented to person, place, and time.  ?Psychiatric:     ?   Behavior: Behavior normal.  ? ? ?ED Results / Procedures / Treatments   ?Labs ?(all labs ordered are listed, but only abnormal results are displayed) ?Labs Reviewed  ?COMPREHENSIVE METABOLIC PANEL - Abnormal; Notable for the following components:  ?    Result Value  ? Glucose, Bld 100 (*)   ? All other components within normal limits  ?CBC WITH DIFFERENTIAL/PLATELET - Abnormal; Notable for the following components:  ? WBC 11.8 (*)   ? RBC 4.03 (*)   ? Hemoglobin 11.7 (*)   ? HCT 37.3 (*)   ? Neutro Abs 9.0 (*)   ? All other components within normal limits  ?URINALYSIS, ROUTINE W REFLEX MICROSCOPIC - Abnormal; Notable for the following components:  ? Color, Urine AMBER (*)   ? Specific Gravity, Urine >1.046 (*)   ? All other components within normal limits  ?LACTIC ACID, PLASMA  ? ? ?EKG ?None ? ?Radiology ?CT ABDOMEN PELVIS W CONTRAST ? ?Result Date: 04/27/2021 ?CLINICAL DATA:  Left lower quadrant abscess with drainage catheter in place. Increasing drainage. EXAM: CT ABDOMEN AND PELVIS WITH  CONTRAST TECHNIQUE: Multidetector CT imaging of the abdomen and pelvis was performed using the standard protocol following bolus administration of intravenous contrast. RADIATION DOSE REDUCTION: This exam was performed according to the departmental dose-optimization program which includes automated exposure control, adjustment of the mA and/or kV according to patient size and/or use of iterative reconstruction technique. CONTRAST:  171mL OMNIPAQUE IOHEXOL 300 MG/ML  SOLN COMPARISON:  04/17/2021 FINDINGS: Lower chest: Numerous pulmonary nodules again seen in the lower lobes, unchanged. No effusions. Heart is normal size. Hepatobiliary: Multiple gallstones within the  gallbladder. No biliary ductal dilatation. No focal hepatic abnormality. Pancreas: No focal abnormality or ductal dilatation. Spleen: No focal abnormality.  Normal size. Adrenals/Urinary Tract: Punctate 1 mm stones in the upper and midpole of the left kidney. No stones on the right. No ureteral stones or hydronephrosis. Urinary bladder and adrenal glands unremarkable. Stomach/Bowel: Stomach, large and small bowel grossly unremarkable. Vascular/Lymphatic: Aortic and iliac calcifications. No evidence of aneurysm or adenopathy. Reproductive: No visible focal abnormality. Other: No free fluid or free air. Left lower quadrant pigtail drainage catheter remains in place adjacent to the proximal sigmoid colon. No residual fluid collection around the drainage catheter. Musculoskeletal: No acute bony abnormality. IMPRESSION: Left lower quadrant drainage catheter remains in place adjacent to the proximal sigmoid colon. No residual fluid collection surrounding the catheter. Punctate left nephrolithiasis. Cholelithiasis. No acute findings. Aortic atherosclerosis. Electronically Signed   By: Rolm Baptise M.D.   On: 04/27/2021 23:32   ? ?Procedures ?Procedures  ? ? ?Medications Ordered in ED ?Medications  ?iohexol (OMNIPAQUE) 300 MG/ML solution 100 mL (100 mLs  Intravenous Contrast Given 04/27/21 2315)  ?HYDROmorphone (DILAUDID) injection 1 mg (1 mg Intravenous Given 04/27/21 2339)  ?ondansetron [facility]) injection 4 mg (4 mg Intravenous Given 04/27/21 2339)  ?sodium chloride 0.9 % bolus 1,

## 2021-04-28 ENCOUNTER — Encounter (HOSPITAL_COMMUNITY): Payer: Self-pay | Admitting: Internal Medicine

## 2021-04-28 ENCOUNTER — Ambulatory Visit: Payer: 59

## 2021-04-28 ENCOUNTER — Other Ambulatory Visit: Payer: Self-pay

## 2021-04-28 DIAGNOSIS — R1032 Left lower quadrant pain: Secondary | ICD-10-CM | POA: Diagnosis not present

## 2021-04-28 DIAGNOSIS — C3492 Malignant neoplasm of unspecified part of left bronchus or lung: Secondary | ICD-10-CM | POA: Diagnosis present

## 2021-04-28 DIAGNOSIS — I252 Old myocardial infarction: Secondary | ICD-10-CM | POA: Diagnosis not present

## 2021-04-28 DIAGNOSIS — T402X5A Adverse effect of other opioids, initial encounter: Secondary | ICD-10-CM | POA: Diagnosis present

## 2021-04-28 DIAGNOSIS — K632 Fistula of intestine: Secondary | ICD-10-CM | POA: Diagnosis present

## 2021-04-28 DIAGNOSIS — I700 Atherosclerosis of aorta: Secondary | ICD-10-CM | POA: Diagnosis present

## 2021-04-28 DIAGNOSIS — G893 Neoplasm related pain (acute) (chronic): Secondary | ICD-10-CM | POA: Diagnosis present

## 2021-04-28 DIAGNOSIS — Z79899 Other long term (current) drug therapy: Secondary | ICD-10-CM | POA: Diagnosis not present

## 2021-04-28 DIAGNOSIS — L03311 Cellulitis of abdominal wall: Principal | ICD-10-CM

## 2021-04-28 DIAGNOSIS — I10 Essential (primary) hypertension: Secondary | ICD-10-CM | POA: Diagnosis present

## 2021-04-28 DIAGNOSIS — N400 Enlarged prostate without lower urinary tract symptoms: Secondary | ICD-10-CM | POA: Diagnosis present

## 2021-04-28 DIAGNOSIS — R109 Unspecified abdominal pain: Secondary | ICD-10-CM | POA: Diagnosis present

## 2021-04-28 DIAGNOSIS — C7931 Secondary malignant neoplasm of brain: Secondary | ICD-10-CM | POA: Diagnosis present

## 2021-04-28 DIAGNOSIS — C7951 Secondary malignant neoplasm of bone: Secondary | ICD-10-CM | POA: Diagnosis present

## 2021-04-28 DIAGNOSIS — K5903 Drug induced constipation: Secondary | ICD-10-CM | POA: Diagnosis present

## 2021-04-28 DIAGNOSIS — Z8249 Family history of ischemic heart disease and other diseases of the circulatory system: Secondary | ICD-10-CM | POA: Diagnosis not present

## 2021-04-28 DIAGNOSIS — Z801 Family history of malignant neoplasm of trachea, bronchus and lung: Secondary | ICD-10-CM | POA: Diagnosis not present

## 2021-04-28 DIAGNOSIS — Z6835 Body mass index (BMI) 35.0-35.9, adult: Secondary | ICD-10-CM | POA: Diagnosis not present

## 2021-04-28 DIAGNOSIS — F17210 Nicotine dependence, cigarettes, uncomplicated: Secondary | ICD-10-CM | POA: Diagnosis present

## 2021-04-28 DIAGNOSIS — Z9221 Personal history of antineoplastic chemotherapy: Secondary | ICD-10-CM | POA: Diagnosis not present

## 2021-04-28 DIAGNOSIS — F32A Depression, unspecified: Secondary | ICD-10-CM | POA: Diagnosis present

## 2021-04-28 DIAGNOSIS — Z7189 Other specified counseling: Secondary | ICD-10-CM | POA: Diagnosis not present

## 2021-04-28 DIAGNOSIS — Z923 Personal history of irradiation: Secondary | ICD-10-CM | POA: Diagnosis not present

## 2021-04-28 DIAGNOSIS — E669 Obesity, unspecified: Secondary | ICD-10-CM | POA: Diagnosis present

## 2021-04-28 DIAGNOSIS — K5720 Diverticulitis of large intestine with perforation and abscess without bleeding: Secondary | ICD-10-CM | POA: Diagnosis present

## 2021-04-28 DIAGNOSIS — K219 Gastro-esophageal reflux disease without esophagitis: Secondary | ICD-10-CM | POA: Diagnosis present

## 2021-04-28 DIAGNOSIS — D849 Immunodeficiency, unspecified: Secondary | ICD-10-CM | POA: Diagnosis present

## 2021-04-28 LAB — CBC WITH DIFFERENTIAL/PLATELET
Abs Immature Granulocytes: 0.04 10*3/uL (ref 0.00–0.07)
Basophils Absolute: 0 10*3/uL (ref 0.0–0.1)
Basophils Relative: 0 %
Eosinophils Absolute: 0 10*3/uL (ref 0.0–0.5)
Eosinophils Relative: 0 %
HCT: 33.9 % — ABNORMAL LOW (ref 39.0–52.0)
Hemoglobin: 10.5 g/dL — ABNORMAL LOW (ref 13.0–17.0)
Immature Granulocytes: 0 %
Lymphocytes Relative: 16 %
Lymphs Abs: 1.7 10*3/uL (ref 0.7–4.0)
MCH: 29.2 pg (ref 26.0–34.0)
MCHC: 31 g/dL (ref 30.0–36.0)
MCV: 94.4 fL (ref 80.0–100.0)
Monocytes Absolute: 0.7 10*3/uL (ref 0.1–1.0)
Monocytes Relative: 6 %
Neutro Abs: 8.2 10*3/uL — ABNORMAL HIGH (ref 1.7–7.7)
Neutrophils Relative %: 78 %
Platelets: 246 10*3/uL (ref 150–400)
RBC: 3.59 MIL/uL — ABNORMAL LOW (ref 4.22–5.81)
RDW: 14.8 % (ref 11.5–15.5)
WBC: 10.6 10*3/uL — ABNORMAL HIGH (ref 4.0–10.5)
nRBC: 0 % (ref 0.0–0.2)

## 2021-04-28 LAB — GLUCOSE, CAPILLARY
Glucose-Capillary: 81 mg/dL (ref 70–99)
Glucose-Capillary: 113 mg/dL — ABNORMAL HIGH (ref 70–99)

## 2021-04-28 LAB — COMPREHENSIVE METABOLIC PANEL
ALT: 11 U/L (ref 0–44)
AST: 18 U/L (ref 15–41)
Albumin: 3.2 g/dL — ABNORMAL LOW (ref 3.5–5.0)
Alkaline Phosphatase: 101 U/L (ref 38–126)
Anion gap: 6 (ref 5–15)
BUN: 11 mg/dL (ref 6–20)
CO2: 25 mmol/L (ref 22–32)
Calcium: 8.9 mg/dL (ref 8.9–10.3)
Chloride: 108 mmol/L (ref 98–111)
Creatinine, Ser: 0.8 mg/dL (ref 0.61–1.24)
GFR, Estimated: >60 mL/min (ref >60)
Glucose, Bld: 92 mg/dL (ref 70–99)
Potassium: 4.1 mmol/L (ref 3.5–5.1)
Sodium: 139 mmol/L (ref 135–145)
Total Bilirubin: 0.8 mg/dL (ref 0.3–1.2)
Total Protein: 6.1 g/dL — ABNORMAL LOW (ref 6.5–8.1)

## 2021-04-28 LAB — C-REACTIVE PROTEIN: CRP: 1.1 mg/dL — ABNORMAL HIGH (ref <1.0)

## 2021-04-28 LAB — URINALYSIS, ROUTINE W REFLEX MICROSCOPIC
Bilirubin Urine: NEGATIVE
Glucose, UA: NEGATIVE mg/dL
Hgb urine dipstick: NEGATIVE
Ketones, ur: NEGATIVE mg/dL
Leukocytes,Ua: NEGATIVE
Nitrite: NEGATIVE
Protein, ur: NEGATIVE mg/dL
Specific Gravity, Urine: >1.046 — ABNORMAL HIGH (ref 1.005–1.030)
pH: 5 (ref 5.0–8.0)

## 2021-04-28 LAB — MAGNESIUM: Magnesium: 1.9 mg/dL (ref 1.7–2.4)

## 2021-04-28 LAB — PROCALCITONIN: Procalcitonin: <0.1 ng/mL

## 2021-04-28 MED ORDER — ENOXAPARIN SODIUM 40 MG/0.4ML IJ SOSY
40.0000 mg | PREFILLED_SYRINGE | INTRAMUSCULAR | Status: DC
  Administered 2021-04-28 – 2021-04-29 (×2): 40 mg via SUBCUTANEOUS
  Filled 2021-04-28 (×2): qty 0.4

## 2021-04-28 MED ORDER — HYDROMORPHONE HCL 1 MG/ML IJ SOLN
0.5000 mg | Freq: Once | INTRAMUSCULAR | Status: AC
  Administered 2021-04-29: 0.5 mg via INTRAVENOUS
  Filled 2021-04-28: qty 0.5

## 2021-04-28 MED ORDER — ONDANSETRON HCL 4 MG/2ML IJ SOLN: 4.0000 mg | Freq: Four times a day (QID) | INTRAMUSCULAR | Status: DC | PRN

## 2021-04-28 MED ORDER — AMLODIPINE BESYLATE 10 MG PO TABS
10.0000 mg | ORAL_TABLET | Freq: Every day | ORAL | Status: DC
  Administered 2021-04-28: 10 mg via ORAL
  Filled 2021-04-28: qty 2

## 2021-04-28 MED ORDER — HYDROMORPHONE HCL 1 MG/ML IJ SOLN
0.5000 mg | INTRAMUSCULAR | Status: DC | PRN
  Administered 2021-04-28 – 2021-04-29 (×3): 0.5 mg via INTRAVENOUS
  Filled 2021-04-28 (×3): qty 0.5

## 2021-04-28 MED ORDER — HYDRALAZINE HCL 20 MG/ML IJ SOLN: 10.0000 mg | Freq: Four times a day (QID) | INTRAMUSCULAR | Status: DC | PRN

## 2021-04-28 MED ORDER — MIRTAZAPINE 30 MG PO TABS
30.0000 mg | ORAL_TABLET | Freq: Every day | ORAL | Status: DC
  Administered 2021-04-28 – 2021-05-02 (×5): 30 mg via ORAL
  Filled 2021-04-28 (×5): qty 1

## 2021-04-28 MED ORDER — POLYETHYLENE GLYCOL 3350 17 G PO PACK
17.0000 g | PACK | Freq: Every day | ORAL | Status: DC | PRN
  Administered 2021-04-28 – 2021-04-29 (×2): 17 g via ORAL
  Filled 2021-04-28 (×2): qty 1

## 2021-04-28 MED ORDER — SODIUM CHLORIDE 0.9 % IV SOLN
1.0000 g | Freq: Once | INTRAVENOUS | Status: AC
  Administered 2021-04-28: 1000 mg via INTRAVENOUS
  Filled 2021-04-28: qty 1

## 2021-04-28 MED ORDER — TAMSULOSIN HCL 0.4 MG PO CAPS: 0.4000 mg | ORAL_CAPSULE | Freq: Every day | ORAL | Status: DC

## 2021-04-28 MED ORDER — ACETAMINOPHEN 325 MG PO TABS: 650.0000 mg | ORAL_TABLET | Freq: Four times a day (QID) | ORAL | Status: DC | PRN

## 2021-04-28 MED ORDER — ONDANSETRON HCL 4 MG PO TABS
4.0000 mg | ORAL_TABLET | Freq: Four times a day (QID) | ORAL | Status: DC | PRN
Start: 2021-04-28 — End: 2021-05-03

## 2021-04-28 MED ORDER — HYDROMORPHONE HCL 1 MG/ML IJ SOLN
1.0000 mg | Freq: Once | INTRAMUSCULAR | Status: AC
  Administered 2021-04-28: 1 mg via INTRAVENOUS
  Filled 2021-04-28: qty 1

## 2021-04-28 MED ORDER — LACTATED RINGERS IV SOLN
INTRAVENOUS | Status: AC
Start: 2021-04-28 — End: 2021-04-28

## 2021-04-28 MED ORDER — ACETAMINOPHEN 650 MG RE SUPP: 650.0000 mg | Freq: Four times a day (QID) | RECTAL | Status: DC | PRN

## 2021-04-28 MED ORDER — HYDROMORPHONE HCL 1 MG/ML IJ SOLN
1.0000 mg | INTRAMUSCULAR | Status: DC | PRN
  Administered 2021-04-28 – 2021-05-03 (×30): 1 mg via INTRAVENOUS
  Filled 2021-04-28 (×30): qty 1

## 2021-04-28 MED ORDER — SODIUM CHLORIDE 0.9 % IV SOLN
1.0000 g | Freq: Three times a day (TID) | INTRAVENOUS | Status: DC
  Filled 2021-04-28: qty 20

## 2021-04-28 MED ORDER — GABAPENTIN 300 MG PO CAPS
600.0000 mg | ORAL_CAPSULE | Freq: Every day | ORAL | Status: DC
  Administered 2021-04-28 – 2021-05-02 (×5): 600 mg via ORAL
  Filled 2021-04-28 (×5): qty 2

## 2021-04-28 MED ORDER — PIPERACILLIN-TAZOBACTAM 3.375 G IVPB
3.3750 g | Freq: Three times a day (TID) | INTRAVENOUS | Status: DC
Start: 2021-04-28 — End: 2021-05-02
  Administered 2021-04-28 – 2021-05-02 (×10): 3.375 g via INTRAVENOUS
  Filled 2021-04-28 (×12): qty 50

## 2021-04-28 MED ORDER — FLUTICASONE PROPIONATE 50 MCG/ACT NA SUSP
1.0000 | Freq: Every day | NASAL | Status: DC
  Administered 2021-05-01 – 2021-05-02 (×2): 1 via NASAL
  Filled 2021-04-28: qty 16

## 2021-04-28 MED ORDER — MEMANTINE HCL 10 MG PO TABS
10.0000 mg | ORAL_TABLET | Freq: Two times a day (BID) | ORAL | Status: DC
  Administered 2021-04-28 – 2021-05-03 (×11): 10 mg via ORAL
  Filled 2021-04-28 (×5): qty 1
  Filled 2021-04-28: qty 2
  Filled 2021-04-28 (×5): qty 1

## 2021-04-28 MED ORDER — FLUCONAZOLE 100 MG PO TABS: 100.0000 mg | ORAL_TABLET | Freq: Every day | ORAL | Status: DC

## 2021-04-28 NOTE — H&P (Signed)
?History and Physical  ? ? ?Patient: Raymond [name] MRN: [id number] DOA: 04/27/2021 ? ?Date of Service: the patient was seen and examined on 04/28/2021 ? ?Patient coming from: Home ? ?Chief Complaint:  ?Chief Complaint  ?Patient presents with  ? Abscess  ? ? ?HPI:  ? ?61 year old male with past medical history of stage IV adenocarcinoma of the lung (Dx 09/2020, metastatic to brain, follows with Dr. Julien Nordmann) hypertension, depression, benign prostatic hyperplasia and complicated history of diverticulitis with a recent diagnosis of diverticular abscess status post percutaneous drain on 3/2 with abscess cultures growing out E. coli, Streptococcus agalactiae, Bacteroides Distasonis and Clostridium innocuum.  Patient was hospitalized at [facility] from 3/1 until 3/7, treated with intravenous Invanz and discharged on an additional 2-week course of oral Augmentin. ? ?Patient explains that for the past several days he has noticed increasing abdominal pain, located in the left lower quadrant.  Pain is sharp in quality, nonradiating, moderate to severe in intensity and worse with movement.  Patient symptoms continued to persist over the next several days and became associated with frequent chills and a noticeable increase in drainage into his percutaneous drainage bag.  In particular, patient is noticed a foul smell coming from the drainage.  Upon further questioning patient denies fevers, nausea, vomiting, recent ingestion of undercooked food, diarrhea or sick contacts. ? ?As of late the patient has been developing associated extremely poor oral intake.  Due to patient's worsening symptoms the patient eventually presented to [facility] emergency department for evaluation. ? ?Upon evaluation in the emergency department patient was found to be in significant pain requiring several doses of intravenous opiate-based analgesics.  Patient was also found to have a mild leukocytosis of 11.8.  CT imaging of the  abdomen and pelvis did not reveal any definitive new infection however.  ER provider discussed case with Dr. Barry Dienes with general surgery who recommended medical management with intravenous antibiotics and hospitalization for now with close clinical monitoring.  The hospitalist group was then called to assess the patient for admission to the hospital. ? ?Review of Systems: Review of Systems  ?Constitutional:  Positive for chills.  ?Gastrointestinal:  Positive for abdominal pain.  ?Neurological:  Positive for weakness.  ?All other systems reviewed and are negative. ? ? ?Past Medical History:  ?Diagnosis Date  ? Arthritis   ? Baker's cyst   ? right  ? Diverticulitis   ? GERD (gastroesophageal reflux disease)   ? Heart attack ([place]) 01/04/2004  ? History of hiatal hernia   ? Hypertension   ? Malignant neoplasm of upper lobe of left lung ([place]) 09/24/2020  ? Pneumonia   ? as a child  ? Sleep apnea   ? no longer on Cpap  ? ? ?Past Surgical History:  ?Procedure Laterality Date  ? BIOPSY  01/26/2021  ? Procedure: BIOPSY;  Surgeon: Ronnette Juniper, MD;  Location: [facility] ENDOSCOPY;  Service: Gastroenterology;;  ? BRONCHIAL BIOPSY  09/14/2020  ? Procedure: BRONCHIAL BIOPSIES;  Surgeon: Collene Gobble, MD;  Location: [facility] ENDOSCOPY;  Service: Cardiopulmonary;;  ? BRONCHIAL BRUSHINGS  09/14/2020  ? Procedure: BRONCHIAL BRUSHINGS;  Surgeon: Collene Gobble, MD;  Location: [facility] ENDOSCOPY;  Service: Cardiopulmonary;;  ? BRONCHIAL NEEDLE ASPIRATION BIOPSY  09/14/2020  ? Procedure: BRONCHIAL NEEDLE ASPIRATION BIOPSIES;  Surgeon: Collene Gobble, MD;  Location: [facility] ENDOSCOPY;  Service: Cardiopulmonary;;  ? CARDIAC CATHETERIZATION    ? CARPAL TUNNEL RELEASE    ? right  ? COLONOSCOPY WITH PROPOFOL N/A 01/26/2021  ?  Procedure: COLONOSCOPY WITH PROPOFOL;  Surgeon: Ronnette Juniper, MD;  Location: [facility] ENDOSCOPY;  Service: Gastroenterology;  Laterality: N/A;  ? FIDUCIAL MARKER PLACEMENT  09/14/2020  ? Procedure: FIDUCIAL MARKER PLACEMENT;  Surgeon: Collene Gobble,  MD;  Location: [facility] ENDOSCOPY;  Service: Cardiopulmonary;;  ? HAND SURGERY    ? left  ? IR CATHETER TUBE CHANGE  03/19/2021  ? IR FLUORO RM 30-60 MIN  03/18/2021  ? IR RADIOLOGIST EVAL & MGMT  04/07/2021  ? KNEE SURGERY    ? left  ? VIDEO BRONCHOSCOPY N/A 09/14/2020  ? Procedure: ROBOTIC VIDEO BRONCHOSCOPY WITH FLUORO;  Surgeon: Collene Gobble, MD;  Location: [facility] ENDOSCOPY;  Service: Cardiopulmonary;  Laterality: N/A;  ? VIDEO BRONCHOSCOPY WITH ENDOBRONCHIAL ULTRASOUND N/A 09/14/2020  ? Procedure: VIDEO BRONCHOSCOPY WITH ENDOBRONCHIAL ULTRASOUND;  Surgeon: Collene Gobble, MD;  Location[facility] ENDOSCOPY;  Service: Cardiopulmonary;  Laterality: N/A;  ? VIDEO BRONCHOSCOPY WITH RADIAL ENDOBRONCHIAL ULTRASOUND  09/14/2020  ? Procedure: VIDEO BRONCHOSCOPY WITH RADIAL ENDOBRONCHIAL ULTRASOUND;  Surgeon: Collene Gobble, MD;  Location: [facility] ENDOSCOPY;  Service: Cardiopulmonary;;  ? ? ?Social History:  reports that he has been smoking cigarettes. He has never used smokeless tobacco. He reports that he does not drink alcohol and does not use drugs. ? ?Allergies  ?Allergen Reactions  ? Benadryl [Diphenhydramine] Itching  ?  01/24/21 pt states this was a one time reaction and that he takes now when needed  ? ? ?Family History  ?Problem Relation Age of Onset  ? Lung cancer Mother   ? Heart failure Sister   ? ? ?Prior to Admission medications   ?Medication Sig Start Date End Date Taking? Authorizing Provider  ?acetaminophen (TYLENOL) 650 MG CR tablet Take 650 mg by mouth every 8 (eight) hours as needed for pain.    [provider]  ?amLODipine (NORVASC) 10 MG tablet Take 1 tablet by mouth daily. 03/10/21   Mercy Riding, MD  ?amoxicillin-clavulanate (AUGMENTIN) 875-125 MG tablet Take 1 tablet by mouth every 12 (twelve) hours. 04/17/21   Regan Lemming, MD  ?dexamethasone (DECADRON) 4 MG tablet Please take 2 tablets 2 times a day the day before, the day of, and the day after treatment. 02/15/21   [name], [name], PA-C  ?Ensure  Plus (ENSURE PLUS) LIQD Take 237 mLs by mouth 3 (three) times daily between meals.    [provider]  ?fluconazole (DIFLUCAN) 100 MG tablet Take 1 tablet (100 mg total) by mouth daily. 04/13/21   [name], [name], PA-C  ?fluticasone (FLONASE) 50 MCG/ACT nasal spray Place 1 spray into both nostrils daily. 03/16/21   Pickenpack-Cousar, Carlena Sax, NP  ?gabapentin (NEURONTIN) 300 MG capsule Take 2 capsules (600 mg total) by mouth at bedtime. 04/13/21 05/13/21  Pickenpack-Cousar, Carlena Sax, NP  ?guaiFENesin (MUCINEX) 600 MG 12 hr tablet Take 600 mg by mouth 2 (two) times daily.    [provider]  ?memantine (NAMENDA) 10 MG tablet Take 1 tablet (10 mg total) by mouth 2 (two) times daily. 01/26/21   [name], PA-C  ?mirtazapine (REMERON) 30 MG tablet Take 1 tablet (30 mg total) by mouth at bedtime. 04/13/21   [name], [name], PA-C  ?Misc Natural Products (OSTEO BI-FLEX ADV JOINT SHIELD) TABS Take 1 tablet by mouth daily. ?Patient not taking: Reported on 03/16/2021    [provider]  ?Multiple Vitamin (MULTIVITAMIN WITH MINERALS) TABS tablet Take 1 tablet by mouth daily. 03/10/21   Mercy Riding, MD  ?ondansetron (ZOFRAN) 8 MG  tablet Take 1 tablet (8 mg total) by mouth every 8 (eight) hours as needed for nausea or vomiting. 04/13/21   Pickenpack-Cousar, Carlena Sax, NP  ?oxyCODONE (OXY IR/ROXICODONE) 5 MG immediate release tablet Take 1-2 tablets (5-10 mg total) by mouth every 6 (six) hours as needed for severe pain. 04/13/21   Pickenpack-Cousar, Carlena Sax, NP  ?oxyCODONE-acetaminophen (PERCOCET/ROXICET) 5-325 MG tablet Take 1 tablet by mouth every 8 (eight) hours as needed for severe pain. 04/17/21   Regan Lemming, MD  ?prochlorperazine (COMPAZINE) 10 MG tablet Take 1 tablet (10 mg total) by mouth every 6 (six) hours as needed for nausea or vomiting. 02/25/21   Pickenpack-Cousar, Carlena Sax, NP  ?senna-docusate (SENNA S) 8.6-50 MG tablet Take 2 tablets by mouth daily. 04/13/21    Pickenpack-Cousar, Carlena Sax, NP  ?tamsulosin (FLOMAX) 0.4 MG CAPS capsule Take 1 capsule (0.4 mg total) by mouth daily after supper. 01/27/21   [name], MD  ?Wheat Dextrin (Emanuel MIX)

## 2021-04-28 NOTE — Consult Note (Signed)
Referring Provider: TRH ?Primary Care Physician:  Pcp, No ?Primary Gastroenterologist:  Dr. Pamalee Leyden GI ? ?Reason for Consultation:  Diverticular abscess, abdominal wall cellulitis ? ?HPI: Raymond [name] is a 61 y.o. male with past medical history of stage IV adenocarcinoma of the lung (Dx 09/2020, metastatic to brain, follows with Dr. Julien Nordmann) hypertension, depression, benign prostatic hyperplasia and complicated history of diverticulitis with a recent diagnosis of diverticular abscess status post percutaneous drain on 3/2 with abscess cultures growing out E. coli, Streptococcus agalactiae, Bacteroides Distasonis and Clostridium innocuum.   ? ?He reports 3 days of left lower quadrant pain.  The pain is now radiating to the left upper quadrant.  Notes the pain is sharp and poking.  He notes he was instructed to start flushing drain once daily.  He notes continued drain output.  Notes output is purulent with worsening smell. ? ?He also complains of new constipation.  Last bowel movement was Thursday.  Denies blood in stool but notes his drain output contains blood.  ? ?Patient was previously hospitalized from 3/1 until 3/7, treated with intravenous Invanz and discharged on an additional 2-week course of oral Augmentin. ? ?Colonoscopy 01/26/2021 ?- Preparation of the colon was poor. ?- Stool in the entire examined colon. ?- Diverticulosis in the sigmoid colon and in the descending colon. ?- Abnormal mucosa in the proximal sigmoid colon and in the distal descending colon. ?Biopsied. ?- The distal rectum and anal verge are normal on retroflexion view. ?-Recommended 59-month out-patient repeat colonoscopy due to poor prep. ? ?Past Medical History:  ?Diagnosis Date  ? Arthritis   ? Baker's cyst   ? right  ? Diverticulitis   ? GERD (gastroesophageal reflux disease)   ? Heart attack ([place]) 01/04/2004  ? History of hiatal hernia   ? Hypertension   ? Malignant neoplasm of upper lobe of left lung ([place]) 09/24/2020  ?  Pneumonia   ? as a child  ? Sleep apnea   ? no longer on Cpap  ? ? ?Past Surgical History:  ?Procedure Laterality Date  ? BIOPSY  01/26/2021  ? Procedure: BIOPSY;  Surgeon: Ronnette Juniper, MD;  Location: [facility] ENDOSCOPY;  Service: Gastroenterology;;  ? BRONCHIAL BIOPSY  09/14/2020  ? Procedure: BRONCHIAL BIOPSIES;  Surgeon: Collene Gobble, MD;  Location: [facility] ENDOSCOPY;  Service: Cardiopulmonary;;  ? BRONCHIAL BRUSHINGS  09/14/2020  ? Procedure: BRONCHIAL BRUSHINGS;  Surgeon: Collene Gobble, MD;  Location: [facility] ENDOSCOPY;  Service: Cardiopulmonary;;  ? BRONCHIAL NEEDLE ASPIRATION BIOPSY  09/14/2020  ? Procedure: BRONCHIAL NEEDLE ASPIRATION BIOPSIES;  Surgeon: Collene Gobble, MD;  Location: [facility] ENDOSCOPY;  Service: Cardiopulmonary;;  ? CARDIAC CATHETERIZATION    ? CARPAL TUNNEL RELEASE    ? right  ? COLONOSCOPY WITH PROPOFOL N/A 01/26/2021  ? Procedure: COLONOSCOPY WITH PROPOFOL;  Surgeon: Ronnette Juniper, MD;  Location: [facility] ENDOSCOPY;  Service: Gastroenterology;  Laterality: N/A;  ? FIDUCIAL MARKER PLACEMENT  09/14/2020  ? Procedure: FIDUCIAL MARKER PLACEMENT;  Surgeon: Collene Gobble, MD;  Location: [facility] ENDOSCOPY;  Service: Cardiopulmonary;;  ? HAND SURGERY    ? left  ? IR CATHETER TUBE CHANGE  03/19/2021  ? IR FLUORO RM 30-60 MIN  03/18/2021  ? IR RADIOLOGIST EVAL & MGMT  04/07/2021  ? KNEE SURGERY    ? left  ? VIDEO BRONCHOSCOPY N/A 09/14/2020  ? Procedure: ROBOTIC VIDEO BRONCHOSCOPY WITH FLUORO;  Surgeon: Collene Gobble, MD;  Location: [facility] ENDOSCOPY;  Service: Cardiopulmonary;  Laterality: N/A;  ? VIDEO BRONCHOSCOPY WITH ENDOBRONCHIAL  ULTRASOUND N/A 09/14/2020  ? Procedure: VIDEO BRONCHOSCOPY WITH ENDOBRONCHIAL ULTRASOUND;  Surgeon: Collene Gobble, MD;  Location: [facility] ENDOSCOPY;  Service: Cardiopulmonary;  Laterality: N/A;  ? VIDEO BRONCHOSCOPY WITH RADIAL ENDOBRONCHIAL ULTRASOUND  09/14/2020  ? Procedure: VIDEO BRONCHOSCOPY WITH RADIAL ENDOBRONCHIAL ULTRASOUND;  Surgeon: Collene Gobble, MD;  Location: [facility] ENDOSCOPY;  Service:  Cardiopulmonary;;  ? ? ?Prior to Admission medications   ?Medication Sig Start Date End Date Taking? Authorizing Provider  ?acetaminophen (TYLENOL) 650 MG CR tablet Take 650 mg by mouth every 8 (eight) hours as needed for pain.    [provider]  ?amLODipine (NORVASC) 10 MG tablet Take 1 tablet by mouth daily. 03/10/21   Mercy Riding, MD  ?amoxicillin-clavulanate (AUGMENTIN) 875-125 MG tablet Take 1 tablet by mouth every 12 (twelve) hours. 04/17/21   Regan Lemming, MD  ?dexamethasone (DECADRON) 4 MG tablet Please take 2 tablets 2 times a day the day before, the day of, and the day after treatment. 02/15/21   [name], [name], PA-C  ?Ensure Plus (ENSURE PLUS) LIQD Take 237 mLs by mouth 3 (three) times daily between meals.    [provider]  ?fluconazole (DIFLUCAN) 100 MG tablet Take 1 tablet (100 mg total) by mouth daily. 04/13/21   [name], [name], PA-C  ?fluticasone (FLONASE) 50 MCG/ACT nasal spray Place 1 spray into both nostrils daily. 03/16/21   Pickenpack-Cousar, Carlena Sax, NP  ?gabapentin (NEURONTIN) 300 MG capsule Take 2 capsules (600 mg total) by mouth at bedtime. 04/13/21 05/13/21  Pickenpack-Cousar, Carlena Sax, NP  ?guaiFENesin (MUCINEX) 600 MG 12 hr tablet Take 600 mg by mouth 2 (two) times daily.    [provider]  ?memantine (NAMENDA) 10 MG tablet Take 1 tablet (10 mg total) by mouth 2 (two) times daily. 01/26/21   [name], PA-C  ?mirtazapine (REMERON) 30 MG tablet Take 1 tablet (30 mg total) by mouth at bedtime. 04/13/21   [name], [name], PA-C  ?Misc Natural Products (OSTEO BI-FLEX ADV JOINT SHIELD) TABS Take 1 tablet by mouth daily. ?Patient not taking: Reported on 03/16/2021    [provider]  ?Multiple Vitamin (MULTIVITAMIN WITH MINERALS) TABS tablet Take 1 tablet by mouth daily. 03/10/21   Mercy Riding, MD  ?ondansetron (ZOFRAN) 8 MG tablet Take 1 tablet (8 mg total) by mouth every 8 (eight) hours as needed for nausea or  vomiting. 04/13/21   Pickenpack-Cousar, Carlena Sax, NP  ?oxyCODONE (OXY IR/ROXICODONE) 5 MG immediate release tablet Take 1-2 tablets (5-10 mg total) by mouth every 6 (six) hours as needed for severe pain. 04/13/21   Pickenpack-Cousar, Carlena Sax, NP  ?oxyCODONE-acetaminophen (PERCOCET/ROXICET) 5-325 MG tablet Take 1 tablet by mouth every 8 (eight) hours as needed for severe pain. 04/17/21   Regan Lemming, MD  ?prochlorperazine (COMPAZINE) 10 MG tablet Take 1 tablet (10 mg total) by mouth every 6 (six) hours as needed for nausea or vomiting. 02/25/21   Pickenpack-Cousar, Carlena Sax, NP  ?senna-docusate (SENNA S) 8.6-50 MG tablet Take 2 tablets by mouth daily. 04/13/21   Pickenpack-Cousar, Carlena Sax, NP  ?tamsulosin (FLOMAX) 0.4 MG CAPS capsule Take 1 capsule (0.4 mg total) by mouth daily after supper. 01/27/21   [name], MD  ?Wheat Dextrin (BENEFIBER DRINK MIX) PACK Take 1 Package by mouth daily at 6 (six) AM.    [provider]  ? ? ?Scheduled Meds: ? amLODipine  10 mg Oral Daily  ? enoxaparin (LOVENOX) injection  40 mg Subcutaneous Q24H  ?  fluconazole  100 mg Oral Daily  ? fluticasone  1 spray Each Nare Daily  ? gabapentin  600 mg Oral QHS  ? memantine  10 mg Oral BID  ? mirtazapine  30 mg Oral QHS  ? tamsulosin  0.4 mg Oral QPC supper  ? ?Continuous Infusions: ? lactated ringers 125 mL/hr at 04/28/21 0553  ? meropenem (MERREM) IV    ? ?PRN Meds:.acetaminophen **OR** acetaminophen, hydrALAZINE, HYDROmorphone (DILAUDID) injection **OR** HYDROmorphone (DILAUDID) injection, ondansetron **OR** ondansetron (ZOFRAN) IV, polyethylene glycol ? ?Allergies as of 04/27/2021 - Review Complete 04/27/2021  ?Allergen Reaction Noted  ? Benadryl [diphenhydramine] Itching 10/12/2020  ? ? ?Family History  ?Problem Relation Age of Onset  ? Lung cancer Mother   ? Heart failure Sister   ? ? ?Social History  ? ?Socioeconomic History  ? Marital status: Married  ?  Spouse name: Not on file  ? Number of children: Not on file  ?  Years of education: Not on file  ? Highest education level: Not on file  ?Occupational History  ? Not on file  ?Tobacco Use  ? Smoking status: Every Day  ?  Types: Cigarettes  ? Smokeless tobacco: Never  ?Vaping Use  ? V

## 2021-04-28 NOTE — Progress Notes (Signed)
Pharmacy Antibiotic Note ? ?Raymond [name] is a 61 y.o. male admitted on 04/27/2021 with sigmoid diverticulitis with abscess; GI and CCS following.  Pharmacy has been consulted for Zosyn dosing. ? ?Plan: ?Zosyn 3.375gm IV q8h (4hr extended infusions) ?No dose adjustments needed, pharmacy will sign off note writing but continue to follow renal function and adjust dose if needed ? ?Height: 5\' 11"  (180.3 cm) ?Weight: 108 kg (238 lb) ?IBW/kg (Calculated) : 75.3 ? ?Temp (24hrs), Avg:98 ?F (36.7 ?C), Min:97.6 ?F (36.4 ?C), Max:98.4 ?F (36.9 ?C) ? ?Recent Labs  ?Lab 04/27/21 ?1313 04/27/21 ?2215 04/28/21 ?0500  ?WBC 9.1 11.8* 10.6*  ?CREATININE 0.84 0.96 0.80  ?LATICACIDVEN  --  1.3  --   ? ?  ?Estimated Creatinine Clearance: 125.8 mL/min (by C-G formula based on SCr of 0.8 mg/dL).   ? ?Allergies  ?Allergen Reactions  ? Benadryl [Diphenhydramine] Itching  ?  01/24/21 pt states this was a one time reaction and that he takes now when needed  ? ? ?Antimicrobials this admission: ?4/26 Colbert Ewing x1  ?4/26 Zosyn >>  ? ?Dose adjustments this admission: ? ?Microbiology results: ?4/26 BCx:  ? ?Thank you for allowing pharmacy to be a part of this patient?s care. ? ?Peggyann Juba, PharmD, BCPS ?Pharmacy: [phone] ?04/28/2021 1:00 PM ? ?

## 2021-04-28 NOTE — Assessment & Plan Note (Signed)
?   Ongoing outpatient follow-up with Dr. Julien Nordmann.  We will add him to the treatment team, appreciate him following along while patient is hospitalized and providing input. ?? Patient is status post SRS to the metastatic brain lesions as well as whole brain radiation ?? Patient is now continuing to receive chemotherapeutic agents including Docetaxel and Cyramaza ? ?

## 2021-04-28 NOTE — ED Notes (Signed)
Materials unable to located statlock dressing. Statlock ordered from [place]. Rn to locate and send to tube station 23.  ?

## 2021-04-28 NOTE — ED Notes (Signed)
Statlock dressing to left drainage tube had significant drainage and partially falling off. Remaining dressing was removed per request from admitting [place]. Ordered new statlock dressing 12-14 fr from materials. Pending delivery. Drainage tube dressed with abd pad and perforating tape. Pt informed temporary securement of tube while awaiting statlock.  ?

## 2021-04-28 NOTE — Assessment & Plan Note (Addendum)
?   Patient presenting with progressively worsening left lower quadrant pain, generalized weakness and poor appetite with subjective fevers and chills ?? Patient exhibiting mild leukocytosis while here thought to be attributable to infection. ?? CT imaging of the abdomen pelvis reveals no evidence of recurrent abscess formation. ?? That being said, on physical examination there is obvious purulent drainage coming from the percutaneous drain insertion site with exquisite tenderness on palpation around the area concerning for an abdominal wall cellulitis. ?? ER provider discussed case with Dr. Barry Dienes with general surgery who recommended reinitiation of intravenous antibiotics and medical management ?? Placing patient on intravenous meropenem, cultures from 3/2 grew out E. coli, Streptococcus agalactiae, Bacteroides Distasonis and Clostridium innocuum ?? Hydrating patient with intravenous isotonic fluids ?? Blood cultures ordered ?? As needed opiate based analgesics for substantial associated pain ?

## 2021-04-28 NOTE — ED Notes (Signed)
Pt provided with ice chips

## 2021-04-28 NOTE — ED Notes (Signed)
General Surg Barry Dienes at bedside  ?

## 2021-04-28 NOTE — Assessment & Plan Note (Signed)
.   Resume patients home regimen of oral antihypertensives . Titrate antihypertensive regimen as necessary to achieve adequate BP control . PRN intravenous antihypertensives for excessively elevated blood pressure   

## 2021-04-28 NOTE — Progress Notes (Signed)
Pharmacy Antibiotic Note ? ?Raymond [name] is a 61 y.o. male admitted on 04/27/2021 with cellulitis.  Pharmacy has been consulted for Meropenem dosing. ? ?Plan: ?Meropenem 1gm IV q8h ?Monitor renal function and cx data  ? ? ?Height: 5\' 11"  (180.3 cm) ?Weight: 108 kg (238 lb) ?IBW/kg (Calculated) : 75.3 ? ?Temp (24hrs), Avg:98.4 ?F (36.9 ?C), Min:98.4 ?F (36.9 ?C), Max:98.4 ?F (36.9 ?C) ? ?Recent Labs  ?Lab 04/27/21 ?1313 04/27/21 ?2215 04/28/21 ?0500  ?WBC 9.1 11.8* 10.6*  ?CREATININE 0.84 0.96  --   ?LATICACIDVEN  --  1.3  --   ?  ?Estimated Creatinine Clearance: 104.9 mL/min (by C-G formula based on SCr of 0.96 mg/dL).   ? ?Allergies  ?Allergen Reactions  ? Benadryl [Diphenhydramine] Itching  ?  01/24/21 pt states this was a one time reaction and that he takes now when needed  ? ? ?Antimicrobials this admission: ?4/26 Invanz x1  ?4/26 Meropenem >>  ? ?Dose adjustments this admission: ? ?Microbiology results: ?4/26 BCx:  ? ?Thank you for allowing pharmacy to be a part of this patient?s care. ? ?Netta Cedars PharmD ?04/28/2021 5:44 AM ? ?

## 2021-04-28 NOTE — Assessment & Plan Note (Signed)
   Continue home regimen of Flomax 

## 2021-04-28 NOTE — Assessment & Plan Note (Addendum)
?   Patient has a somewhat complicated history of recent diagnosis of possible abscess of the sigmoid colon and hospitalization in early March status post percutaneous drain placement. ?? According to outpatient notes by Dr. Julien Nordmann, interventional radiology now is reconsidering whether patient had actually developed a localized perforation due to his malignancy rather than the original diagnosis of diverticulitis with abscess. ?? Outpatient plan was for patient to follow-up with Dr. Deno Etienne for consideration of endoscopic evaluation.  Since patient is now presenting for hospitalization, we will send a secure chat message to Dr. Therisa Doyne and request consultation to see if she would like to perform the endoscopic evaluation while hospitalized. ?? We will keep patient n.p.o. until Dr. Therisa Doyne can provide input. ?

## 2021-04-28 NOTE — Progress Notes (Signed)
? ?Subjective/Chief Complaint: ? ?Patient is a very nice 61 year old male who is being treated with palliative chemotherapy for widespread lung cancer metastases.  He has brain mets.  He required a drain placement for diverticulitis of the sigmoid colon with abscess at the beginning of March.  He had this adjusted in the middle of March.  He most recently has been on a course of 1 week of oral Augmentin.  He finished this approximately a week ago.  Around 3 to 4 days prior to presentation to the emergency department he started developing increasing left lower quadrant pain, chills, and increased constipation.  He does have chronic constipation due to his OxyContin that he takes for his cancer related pain. ? ? ?Objective: ?Vital signs in last 24 hours: ?Temp:  [98.4 ?F (36.9 ?C)] 98.4 ?F (36.9 ?C) (04/25 2158) ?Pulse Rate:  [75-100] 77 (04/26 0600) ?Resp:  [12-22] 16 (04/26 0600) ?BP: (109-159)/(66-133) 125/66 (04/26 0600) ?SpO2:  [91 %-97 %] 95 % (04/26 0600) ?Weight:  [448 kg] 108 kg (04/25 2158) ?  ? ?Intake/Output from previous day: ?No intake/output data recorded. ?Intake/Output this shift: ?No intake/output data recorded. ? ?General appearance: alert, cooperative, and no distress ?Resp: clear to auscultation bilaterally ?Cardio: regular rate and rhythm ?GI: soft, non distended, mildly tender LLQ, drain with murky brown output. ?Extremities: extremities normal, atraumatic, no cyanosis or edema ? ?Lab Results:  ?Recent Labs  ?  04/27/21 ?2215 04/28/21 ?0500  ?WBC 11.8* 10.6*  ?HGB 11.7* 10.5*  ?HCT 37.3* 33.9*  ?PLT 270 246  ? ?BMET ?Recent Labs  ?  04/27/21 ?2215 04/28/21 ?0500  ?NA 139 139  ?K 3.8 4.1  ?CL 105 108  ?CO2 26 25  ?GLUCOSE 100* 92  ?BUN 12 11  ?CREATININE 0.96 0.80  ?CALCIUM 9.4 8.9  ? ?PT/INR ?No results for input(s): LABPROT, INR in the last 72 hours. ?ABG ?No results for input(s): PHART, HCO3 in the last 72 hours. ? ?Invalid input(s): PCO2, PO2 ? ?Studies/Results: ?CT ABDOMEN PELVIS W  CONTRAST ? ?Result Date: 04/27/2021 ?CLINICAL DATA:  Left lower quadrant abscess with drainage catheter in place. Increasing drainage. EXAM: CT ABDOMEN AND PELVIS WITH CONTRAST TECHNIQUE: Multidetector CT imaging of the abdomen and pelvis was performed using the standard protocol following bolus administration of intravenous contrast. RADIATION DOSE REDUCTION: This exam was performed according to the departmental dose-optimization program which includes automated exposure control, adjustment of the mA and/or kV according to patient size and/or use of iterative reconstruction technique. CONTRAST:  128mL OMNIPAQUE IOHEXOL 300 MG/ML  SOLN COMPARISON:  04/17/2021 FINDINGS: Lower chest: Numerous pulmonary nodules again seen in the lower lobes, unchanged. No effusions. Heart is normal size. Hepatobiliary: Multiple gallstones within the gallbladder. No biliary ductal dilatation. No focal hepatic abnormality. Pancreas: No focal abnormality or ductal dilatation. Spleen: No focal abnormality.  Normal size. Adrenals/Urinary Tract: Punctate 1 mm stones in the upper and midpole of the left kidney. No stones on the right. No ureteral stones or hydronephrosis. Urinary bladder and adrenal glands unremarkable. Stomach/Bowel: Stomach, large and small bowel grossly unremarkable. Vascular/Lymphatic: Aortic and iliac calcifications. No evidence of aneurysm or adenopathy. Reproductive: No visible focal abnormality. Other: No free fluid or free air. Left lower quadrant pigtail drainage catheter remains in place adjacent to the proximal sigmoid colon. No residual fluid collection around the drainage catheter. Musculoskeletal: No acute bony abnormality. IMPRESSION: Left lower quadrant drainage catheter remains in place adjacent to the proximal sigmoid colon. No residual fluid collection surrounding the catheter. Punctate left  nephrolithiasis. Cholelithiasis. No acute findings. Aortic atherosclerosis. Electronically Signed   By: Rolm Baptise  M.D.   On: 04/27/2021 23:32   ? ?Anti-infectives: ?Anti-infectives (From admission, onward)  ? ? Start     Dose/Rate Route Frequency Ordered Stop  ? 04/28/21 2200  meropenem (MERREM) 1 g in sodium chloride 0.9 % 100 mL IVPB       ? 1 g ?200 mL/hr over 30 Minutes Intravenous Every 8 hours 04/28/21 0551    ? 04/28/21 1000  fluconazole (DIFLUCAN) tablet 100 mg       ? 100 mg Oral Daily 04/28/21 0524    ? 04/28/21 0045  ertapenem (INVANZ) 1,000 mg in sodium chloride 0.9 % 100 mL IVPB       ? 1 g ?200 mL/hr over 30 Minutes Intravenous  Once 04/28/21 0035 04/28/21 0155  ? ?  ? ? ?Assessment/Plan: ? ?  ?Assessment/Plan ?Sigmoid diverticulitis with abscess ?- s/p IR drain 3/2, revision 3/17. ? ?Pt with worsening symptoms.   ?Readmit with IV antibiotics. ? ?This is a very difficult situation given that he may potentially have a lung cancer metastasis causing this perforation.  The perforation is now a controlled fistula via the drain.  This is unlikely to heal given his overall health issues. ? ?We may end up with the status quo type situation given his widespread metastatic lung cancer.  Surgery to follow. ?  ?ID - meropenem started by ED. No new cultures.  Can perhaps downgrade to zosyn.  Would likely do more prolonged therapy as he started having more symptoms around 3-4 days after stopping augmentin.  ? ?VTE - lovenox ?FEN - IVF, soft, supplements ?Foley - none ?  ?Metastatic lung cancer on chemo, whole brain radiation ?Obesity BMI 35.43 ?HTN ?GERD ?OSA ?MI in 2006 ? ? LOS: 0 days  ? ? ?Milus Height, MD FACS ?Surgical Oncology, General Surgery, Trauma and Critical Care ?[facility], [place] ?[phone] for weekday/non holidays ?Check [email or web address] for coverage night/weekend/holidays ? ?Do not use SecureChat as it is not reliable for timely patient care.   ? ?

## 2021-04-28 NOTE — Progress Notes (Signed)
?PROGRESS NOTE ? ?Raymond RHODA  [name]:[id number] DOB: January 30, 1960 DOA: 04/27/2021 ?PCP: Pcp, No  ? 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  Patient was recently hospitalized here in March, treated for diverticulitis of sigmoid colon with abscess ,s/p  drain placement and was treated with antibiotics.  He finished antibiotics about a week ago.  General surgery, GI following.  Currently on broad-spectrum antibiotics.  Concern for abdominal wall cellulitis. ? ?Assessment & Plan: ? ?Principal Problem: ?  Abdominal wall cellulitis ?Active Problems: ?  Abscess of sigmoid colon due to diverticulitis ?  Adenocarcinoma of left lung, stage 4 ([place]) ?  Essential hypertension ?  BPH (benign prostatic hyperplasia) ?  Abdominal pain ? ? ?Diverticular abscess, status post drain placement/abdominal cellulitis: Recently treated for this with drain placement and was on  antibiotics.  Wound cultures with multiple organisms including E. coli, Streptococcus agalactiae , Bacteroides, Clostridium.  Finished antibiotics course but came again with chills, drainage, foul smell around the drain.  There was also concern for abdominal cellulitis.  Suspicion for invasion from metastatic lung cancer. ?General surgery, GI following.  No plan for immediate intervention.  Low chance of healing.  General surgery recommending prolonged therapy.  Currently on broad-spectrum antibiotics, follow-up blood cultures. ? ?Metastatic adenocarcinoma of left lung: Follows with Dr. Julien Nordmann.  Status post SRS to the metastatic brain lesions as well as whole brain  radiation.  Currently on chemotherapeutic agents ? ?Hypertension: Monitor blood pressure.  Currently blood pressure soft.  We will continue to hold home medications ? ?BPH: On Flomax ? ?Obesity: BMI 33 ? ?Goals of care: Extremely poor quality of life due to metastatic lung cancer.  Oncology requested to follow-up.  We will also consult palliative care ? ? ? ? ?  ?  ? ?DVT prophylaxis:enoxaparin (LOVENOX) injection 40 mg Start: 04/28/21 1000 ? ? ?  Code Status: Full Code ? ?Family Communication: None at the bedside ? ?Patient status:Inpatient ? ?Patient is from :Home ? ?Anticipated discharge DJ:[facility] ? ?Estimated DC date:not sure at this point ? ? ?Consultants: General surgery, GI ? ?Procedures: None ? ?Antimicrobials:  ?Anti-infectives (From admission, onward)  ? ? Start     Dose/Rate Route Frequency Ordered Stop  ? 04/28/21 2200  meropenem (MERREM) 1 g in sodium chloride 0.9 % 100 mL IVPB       ? 1 g ?200 mL/hr over 30 Minutes Intravenous Every 8 hours 04/28/21 0551    ? 04/28/21 1000  fluconazole (DIFLUCAN) tablet 100 mg  Status:  Discontinued       ? 100 mg Oral Daily 04/28/21 0524 04/28/21 0914  ? 04/28/21 0045  ertapenem (INVANZ) 1,000 mg in sodium chloride 0.9 % 100 mL IVPB       ? 1 g ?200 mL/hr over 30 Minutes Intravenous  Once 04/28/21 0035 04/28/21 0155  ? ?  ? ? ?Subjective: ?Patient seen and examined at the bedside this afternoon.  Hemodynamically stable during my evaluation.  Looks overall comfortable.  No report of worsening abdominal pain, nausea or vomiting.  Lying in bed. ? ?Objective: ?Vitals:  ? 04/28/21 0800 04/28/21 0900 04/28/21 0950 04/28/21 1157  ?BP: 109/72 (!) 93/49 115/71 124/76  ?  Pulse: 77 73 78 72  ?Resp: (!) 5 11 14 18   ?Temp:    97.6 ?F (36.4 ?C)  ?TempSrc:    Oral  ?SpO2: (!) 88% 90% 93% 98%  ?Weight:      ?Height:      ? ? ?Intake/Output Summary (Last 24 hours) at 04/28/2021 1240 ?Last data filed at 04/28/2021 4580 ?Gross per 24 hour  ?Intake --  ?Output 250 ml  ?Net -250 ml  ? ?Filed  Weights  ? 04/27/21 2158  ?Weight: 108 kg  ? ? ?Examination: ? ?General exam: Overall comfortable, not in distress, obese ?HEENT: PERRL ?Respiratory system:  no wheezes or crackles  ?Cardiovascular system: S1 & S2 heard, RRR.  ?Gastrointestinal system: Abdomen is nondistended, soft , left lower quadrant drain, tenderness on deep palpation on the left lower quadrant ?Central nervous system: Alert and oriented ?Extremities: No edema, no clubbing ,no cyanosis ?Skin: No rashes, no ulcers,no icterus   ? ? ?Data Reviewed: I have personally reviewed following labs and imaging studies ? ?CBC: ?Recent Labs  ?Lab 04/27/21 ?1313 04/27/21 ?2215 04/28/21 ?0500  ?WBC 9.1 11.8* 10.6*  ?NEUTROABS 6.9 9.0* 8.2*  ?HGB 10.8* 11.7* 10.5*  ?HCT 34.2* 37.3* 33.9*  ?MCV 90.5 92.6 94.4  ?PLT 244 270 246  ? ?Basic Metabolic Panel: ?Recent Labs  ?Lab 04/27/21 ?1313 04/27/21 ?2215 04/28/21 ?0500  ?NA 141 139 139  ?K 4.0 3.8 4.1  ?CL 104 105 108  ?CO2 27 26 25   ?GLUCOSE 105* 100* 92  ?BUN 11 12 11   ?CREATININE 0.84 0.96 0.80  ?CALCIUM 9.1 9.4 8.9  ?MG  --   --  1.9  ? ? ? ?No results found for this or any previous visit (from the past 240 hour(s)).  ? ?Radiology Studies: ?CT ABDOMEN PELVIS W CONTRAST ? ?Result Date: 04/27/2021 ?CLINICAL DATA:  Left lower quadrant abscess with drainage catheter in place. Increasing drainage. EXAM: CT ABDOMEN AND PELVIS WITH CONTRAST TECHNIQUE: Multidetector CT imaging of the abdomen and pelvis was performed using the standard protocol following bolus administration of intravenous contrast. RADIATION DOSE REDUCTION: This exam was performed according to the departmental dose-optimization program which includes automated exposure control, adjustment of the mA and/or kV according to patient size and/or use of iterative reconstruction technique. CONTRAST:  120mL OMNIPAQUE IOHEXOL 300 MG/ML  SOLN COMPARISON:  04/17/2021 FINDINGS: Lower chest: Numerous pulmonary nodules again seen in the lower lobes, unchanged. No  effusions. Heart is normal size. Hepatobiliary: Multiple gallstones within the gallbladder. No biliary ductal dilatation. No focal hepatic abnormality. Pancreas: No focal abnormality or ductal dilatation. Spleen: No focal abnormality.  Normal size. Adrenals/Urinary Tract: Punctate 1 mm stones in the upper and midpole of the left kidney. No stones on the right. No ureteral stones or hydronephrosis. Urinary bladder and adrenal glands unremarkable. Stomach/Bowel: Stomach, large and small bowel grossly unremarkable. Vascular/Lymphatic: Aortic and iliac calcifications. No evidence of aneurysm or adenopathy. Reproductive: No visible focal abnormality. Other: No free fluid or free air. Left lower quadrant pigtail drainage catheter remains in place adjacent to the proximal sigmoid colon. No residual fluid collection around the drainage catheter. Musculoskeletal: No acute bony abnormality. IMPRESSION: Left lower quadrant drainage catheter remains in place adjacent to the proximal sigmoid colon. No residual fluid collection surrounding the catheter. Punctate left nephrolithiasis. Cholelithiasis. No acute findings. Aortic atherosclerosis. Electronically Signed   By: Rolm Baptise M.D.   On: 04/27/2021 23:32   ? ?Scheduled Meds: ? amLODipine  10 mg Oral Daily  ? enoxaparin (LOVENOX)  injection  40 mg Subcutaneous Q24H  ? fluticasone  1 spray Each Nare Daily  ? gabapentin  600 mg Oral QHS  ? memantine  10 mg Oral BID  ? mirtazapine  30 mg Oral QHS  ? ?Continuous Infusions: ? lactated ringers 125 mL/hr at 04/28/21 0553  ? meropenem (MERREM) IV    ? ? ? LOS: 0 days  ? ?Shelly Coss, MD ?[facility] ?P4/26/2023, 12:40 PM   ?

## 2021-04-29 ENCOUNTER — Inpatient Hospital Stay (HOSPITAL_COMMUNITY): Payer: Medicaid Other

## 2021-04-29 ENCOUNTER — Encounter (HOSPITAL_COMMUNITY): Payer: Self-pay | Admitting: Internal Medicine

## 2021-04-29 ENCOUNTER — Other Ambulatory Visit: Payer: Self-pay | Admitting: Internal Medicine

## 2021-04-29 DIAGNOSIS — L03311 Cellulitis of abdominal wall: Secondary | ICD-10-CM | POA: Diagnosis not present

## 2021-04-29 MED ORDER — SODIUM CHLORIDE 0.9% FLUSH
3.0000 mL | Freq: Every day | INTRAVENOUS | Status: DC
  Administered 2021-04-29 – 2021-05-03 (×5): 3 mL

## 2021-04-29 MED ORDER — SODIUM CHLORIDE (PF) 0.9 % IJ SOLN
INTRAMUSCULAR | Status: AC
Start: 2021-04-29 — End: 2021-04-29
  Filled 2021-04-29: qty 50

## 2021-04-29 MED ORDER — IOHEXOL 300 MG/ML  SOLN
75.0000 mL | Freq: Once | INTRAMUSCULAR | Status: AC | PRN
  Administered 2021-04-29: 75 mL via INTRAVENOUS

## 2021-04-29 MED ORDER — ALUM & MAG HYDROXIDE-SIMETH 200-200-20 MG/5ML PO SUSP
30.0000 mL | ORAL | Status: DC | PRN
Start: 2021-04-29 — End: 2021-05-03
  Administered 2021-04-29: 30 mL via ORAL
  Filled 2021-04-29: qty 30

## 2021-04-29 NOTE — Progress Notes (Addendum)
? ? ?   ?Subjective: ?CC: ?Stable sharp pain in llq, near drain. Passing flatus. No bm since 4/22. NPO. Denies n/v.  ? ?Objective: ?Vital signs in last 24 hours: ?Temp:  [97.6 ?F (36.4 ?C)-98.6 ?F (37 ?C)] 98.2 ?F (36.8 ?C) (04/27 1517) ?Pulse Rate:  [72-79] 73 (04/27 0511) ?Resp:  [5-18] 14 (04/27 0511) ?BP: (93-133)/(49-77) 133/77 (04/27 0511) ?SpO2:  [88 %-100 %] 95 % (04/27 0511) ?Last BM Date : 04/24/21 ? ?Intake/Output from previous day: ?04/26 0701 - 04/27 0700 ?In: 1783.8 [P.O.:240; I.V.:1493.8; IV Piggyback:50] ?Out: 1150 [Urine:1150] ?Intake/Output this shift: ?No intake/output data recorded. ? ?PE: ?Gen:  Alert, NAD, pleasant ?Pulm:  Rate and effort normal ?Abd: Soft, ND, LLQ tenderness greatest around drain and without peritonitis, +BS. IR Drain w/ murky brown output in gravity bag.  ? ?Lab Results:  ?Recent Labs  ?  04/27/21 ?2215 04/28/21 ?0500  ?WBC 11.8* 10.6*  ?HGB 11.7* 10.5*  ?HCT 37.3* 33.9*  ?PLT 270 246  ? ?BMET ?Recent Labs  ?  04/27/21 ?2215 04/28/21 ?0500  ?NA 139 139  ?K 3.8 4.1  ?CL 105 108  ?CO2 26 25  ?GLUCOSE 100* 92  ?BUN 12 11  ?CREATININE 0.96 0.80  ?CALCIUM 9.4 8.9  ? ?PT/INR ?No results for input(s): LABPROT, INR in the last 72 hours. ?CMP  ?   ?Component Value Date/Time  ? NA 139 04/28/2021 0500  ? K 4.1 04/28/2021 0500  ? CL 108 04/28/2021 0500  ? CO2 25 04/28/2021 0500  ? GLUCOSE 92 04/28/2021 0500  ? BUN 11 04/28/2021 0500  ? CREATININE 0.80 04/28/2021 0500  ? CREATININE 0.84 04/27/2021 1313  ? CALCIUM 8.9 04/28/2021 0500  ? PROT 6.1 (L) 04/28/2021 0500  ? ALBUMIN 3.2 (L) 04/28/2021 0500  ? ALBUMIN 4.0 05/20/2019 1951  ? AST 18 04/28/2021 0500  ? AST 17 04/27/2021 1313  ? ALT 11 04/28/2021 0500  ? ALT 9 04/27/2021 1313  ? ALKPHOS 101 04/28/2021 0500  ? BILITOT 0.8 04/28/2021 0500  ? BILITOT 0.4 04/27/2021 1313  ? GFRNONAA >60 04/28/2021 0500  ? GFRNONAA >60 04/27/2021 1313  ? GFRAA >60 05/25/2019 0427  ? ?Lipase  ?   ?Component Value Date/Time  ? LIPASE 21 03/03/2021 0958   ? ? ?Studies/Results: ?CT ABDOMEN PELVIS W CONTRAST ? ?Result Date: 04/27/2021 ?CLINICAL DATA:  Left lower quadrant abscess with drainage catheter in place. Increasing drainage. EXAM: CT ABDOMEN AND PELVIS WITH CONTRAST TECHNIQUE: Multidetector CT imaging of the abdomen and pelvis was performed using the standard protocol following bolus administration of intravenous contrast. RADIATION DOSE REDUCTION: This exam was performed according to the departmental dose-optimization program which includes automated exposure control, adjustment of the mA and/or kV according to patient size and/or use of iterative reconstruction technique. CONTRAST:  11mL OMNIPAQUE IOHEXOL 300 MG/ML  SOLN COMPARISON:  04/17/2021 FINDINGS: Lower chest: Numerous pulmonary nodules again seen in the lower lobes, unchanged. No effusions. Heart is normal size. Hepatobiliary: Multiple gallstones within the gallbladder. No biliary ductal dilatation. No focal hepatic abnormality. Pancreas: No focal abnormality or ductal dilatation. Spleen: No focal abnormality.  Normal size. Adrenals/Urinary Tract: Punctate 1 mm stones in the upper and midpole of the left kidney. No stones on the right. No ureteral stones or hydronephrosis. Urinary bladder and adrenal glands unremarkable. Stomach/Bowel: Stomach, large and small bowel grossly unremarkable. Vascular/Lymphatic: Aortic and iliac calcifications. No evidence of aneurysm or adenopathy. Reproductive: No visible focal abnormality. Other: No free fluid or free air. Left lower quadrant pigtail  drainage catheter remains in place adjacent to the proximal sigmoid colon. No residual fluid collection around the drainage catheter. Musculoskeletal: No acute bony abnormality. IMPRESSION: Left lower quadrant drainage catheter remains in place adjacent to the proximal sigmoid colon. No residual fluid collection surrounding the catheter. Punctate left nephrolithiasis. Cholelithiasis. No acute findings. Aortic  atherosclerosis. Electronically Signed   By: Rolm Baptise M.D.   On: 04/27/2021 23:32   ? ?Anti-infectives: ?Anti-infectives (From admission, onward)  ? ? Start     Dose/Rate Route Frequency Ordered Stop  ? 04/28/21 2200  meropenem (MERREM) 1 g in sodium chloride 0.9 % 100 mL IVPB  Status:  Discontinued       ? 1 g ?200 mL/hr over 30 Minutes Intravenous Every 8 hours 04/28/21 0551 04/28/21 1252  ? 04/28/21 2200  piperacillin-tazobactam (ZOSYN) IVPB 3.375 g       ? 3.375 g ?12.5 mL/hr over 240 Minutes Intravenous Every 8 hours 04/28/21 1301    ? 04/28/21 1000  fluconazole (DIFLUCAN) tablet 100 mg  Status:  Discontinued       ? 100 mg Oral Daily 04/28/21 0524 04/28/21 0914  ? 04/28/21 0045  ertapenem (INVANZ) 1,000 mg in sodium chloride 0.9 % 100 mL IVPB       ? 1 g ?200 mL/hr over 30 Minutes Intravenous  Once 04/28/21 0035 04/28/21 0155  ? ?  ? ? ? ?Assessment/Plan ?Complicated Sigmoid Diverticulitis  ?- S/p IR drain 3/2. Exchanged 3/17. 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  ?- CT 4/25 w/ IR drain in the LLQ adjacent to proximal sigmoid colon without residual fluid collection. No obvious inflammation of the colon on CT but worsening symptoms and readdmitted on IV abx.  ?- As stated by Dr. Barry Dienes - This is a very difficult situation given that he may potentially have a lung cancer metastasis causing this perforation.  The perforation is now a controlled fistula via the drain.  This is unlikely to heal given his overall health issues.  ?- Cont conservative management with IV abx and bowel rest for now. Will review CT and plan with Dr. Harlow Asa  ?  ?ID - Invanz/Fluconazole 4/26. Zosyn 4/26 >>  ?VTE - SCDs, Lovenox ?FEN - IVF, NPO ?Foley - none ?  ?Metastatic lung cancer on chemo, whole brain radiation ?Obesity BMI 35.43 ?HTN ?GERD ?OSA ?MI in 2006 ? ? LOS: 1 day  ? ? ?Jillyn Ledger , PA-C ?[facility] ?04/29/2021, 7:32 AM ?Please see Amion for pager number  during day hours 7:00am-4:30pm ? ?

## 2021-04-29 NOTE — Progress Notes (Signed)
DIAGNOSIS: Stage IV (T2b, N2, M1c) non-small cell lung cancer, adenocarcinoma presented with left upper lobe lung mass in addition to left hilar and mediastinal lymphadenopathy diagnosed in September 2022. He also has metastatic disease to the brain.  ?  ?Molecular Studies: No actionable mutations on tissue tested by [facility] ?  ?PD-L1 expression 10%. ?  ?PRIOR THERAPY: ?1) SRS to the metastatic brain lesions under the care of Dr. Lisbeth Renshaw Scheduled for 10/21/20 ?2) Whole brain radiation under the care of Dr. Lisbeth Renshaw. Completed on 02/10/21 ?3)  Carboplatin for an AUC of 5, Alimta 500 mg per metered squared, Keytruda 200 mg IV every 3 weeks. First dose on 10/12/20. Status post 5 cycles. Discontinued due to disease progression  ?  ?CURRENT THERAPY: Docetaxel and Cyramaza IV every 3 weeks with neulasta support.  Status post 3 cycles.  Starting from cycle #2 we will discontinue Cyramza because of the recent diverticular abscess and perforation. ? ?Subjective: ?The patient is seen and examined today.  He is a very pleasant 61 years old African-American male with a stage IV non-small cell lung cancer, adenocarcinoma status post SRS to brain metastasis.  The patient also received systemic chemotherapy with carboplatin, Alimta and Keytruda discontinued after cycle #5 secondary to disease progression.  He is currently on treatment with docetaxel and Cyramza but Cyramza was discontinued after cycle #1 because of the diverticular abscess and perforation.  He has a abscess drain by interventional radiology.  The patient noticed increasing abdominal pain located in the left lower quadrant over the last few days associated with frequent chills and increasing the drainage of the percutaneous diverticular drain.  He presented to the hospital for evaluation.  He is currently on IV medication with Zosyn.  He is feeling a little bit better.  He denied having any current chest pain or shortness of breath. ? ? ?Objective: ?Vital signs in  last 24 hours: ?Temp:  [97.6 ?F (36.4 ?C)-98.6 ?F (37 ?C)] 98.2 ?F (36.8 ?C) (04/27 5102) ?Pulse Rate:  [72-79] 73 (04/27 0511) ?Resp:  [11-18] 14 (04/27 0511) ?BP: (93-133)/(49-77) 133/77 (04/27 0511) ?SpO2:  [90 %-100 %] 95 % (04/27 0511) ? ?Intake/Output from previous day: ?04/26 0701 - 04/27 0700 ?In: 1783.8 [P.O.:240; I.V.:1493.8; IV Piggyback:50] ?Out: 1150 [Urine:1150] ?Intake/Output this shift: ?No intake/output data recorded. ? ?General appearance: alert, cooperative, fatigued, and no distress ?GI: Percutaneous drainage in the left lower quadrant ?Extremities: extremities normal, atraumatic, no cyanosis or edema ? ?Lab Results:  ?Recent Labs  ?  04/27/21 ?2215 04/28/21 ?0500  ?WBC 11.8* 10.6*  ?HGB 11.7* 10.5*  ?HCT 37.3* 33.9*  ?PLT 270 246  ? ?BMET ?Recent Labs  ?  04/27/21 ?2215 04/28/21 ?0500  ?NA 139 139  ?K 3.8 4.1  ?CL 105 108  ?CO2 26 25  ?GLUCOSE 100* 92  ?BUN 12 11  ?CREATININE 0.96 0.80  ?CALCIUM 9.4 8.9  ? ? ?Studies/Results: ?CT ABDOMEN PELVIS W CONTRAST ? ?Result Date: 04/27/2021 ?CLINICAL DATA:  Left lower quadrant abscess with drainage catheter in place. Increasing drainage. EXAM: CT ABDOMEN AND PELVIS WITH CONTRAST TECHNIQUE: Multidetector CT imaging of the abdomen and pelvis was performed using the standard protocol following bolus administration of intravenous contrast. RADIATION DOSE REDUCTION: This exam was performed according to the departmental dose-optimization program which includes automated exposure control, adjustment of the mA and/or kV according to patient size and/or use of iterative reconstruction technique. CONTRAST:  122m OMNIPAQUE IOHEXOL 300 MG/ML  SOLN COMPARISON:  04/17/2021 FINDINGS: Lower chest: Numerous pulmonary nodules  again seen in the lower lobes, unchanged. No effusions. Heart is normal size. Hepatobiliary: Multiple gallstones within the gallbladder. No biliary ductal dilatation. No focal hepatic abnormality. Pancreas: No focal abnormality or ductal dilatation.  Spleen: No focal abnormality.  Normal size. Adrenals/Urinary Tract: Punctate 1 mm stones in the upper and midpole of the left kidney. No stones on the right. No ureteral stones or hydronephrosis. Urinary bladder and adrenal glands unremarkable. Stomach/Bowel: Stomach, large and small bowel grossly unremarkable. Vascular/Lymphatic: Aortic and iliac calcifications. No evidence of aneurysm or adenopathy. Reproductive: No visible focal abnormality. Other: No free fluid or free air. Left lower quadrant pigtail drainage catheter remains in place adjacent to the proximal sigmoid colon. No residual fluid collection around the drainage catheter. Musculoskeletal: No acute bony abnormality. IMPRESSION: Left lower quadrant drainage catheter remains in place adjacent to the proximal sigmoid colon. No residual fluid collection surrounding the catheter. Punctate left nephrolithiasis. Cholelithiasis. No acute findings. Aortic atherosclerosis. Electronically Signed   By: Rolm Baptise M.D.   On: 04/27/2021 23:32   ? ?Medications: I have reviewed the patient's current medications. ? ? ?Assessment/Plan: ?This is a very pleasant 61 years old African-American male with metastatic non-small cell lung cancer, adenocarcinoma status post SRS to multiple brain metastasis in addition to systemic chemotherapy initially with carboplatin, Alimta and Keytruda for 5 cycles discontinued secondary to disease progression. ?He is currently undergoing second line systemic chemotherapy now with single agent docetaxel status post 3 cycles.  He has been tolerating this treatment well with no concerning adverse effects. ?Recent CT scan of the chest showed no evidence for disease progression in the abdomen and pelvis but the patient had the left lower quadrant drainage and catheter in place adjacent to the proximal sigmoid colon. ?He was supposed to have repeat CT scan of the chest, abdomen pelvis tomorrow for restaging of his disease.  Since the patient has  the scan of the abdomen and pelvis performed, I would recommend for him to have CT scan of the chest performed either today or tomorrow for assessment of his disease. ?I will see the patient back for follow-up visit after discharge for reevaluation and discussion of future treatment options. ?Thank you for taking good care of Mr. Adrian.  I will continue to follow-up the patient with you and assist in his management on as-needed basis.  ? LOS: 1 day  ? ? ?Eilleen Kempf ?04/29/2021 ? ?  ?

## 2021-04-29 NOTE — Progress Notes (Signed)
?PROGRESS NOTE ? ?ARIUS HARNOIS  TKZ:[id number] DOB: 1960-01-29 DOA: 04/27/2021 ?PCP: Pcp, No  ? 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  Patient was recently hospitalized here in March, treated for diverticulitis of sigmoid colon with abscess ,s/p  drain placement and was treated with antibiotics.  He finished antibiotics about a week ago.  General surgery, GI were consulted.  Currently on zosyn.  Clinically improving. ? ?Assessment & Plan: ? ?Principal Problem: ?  Abdominal wall cellulitis ?Active Problems: ?  Abscess of sigmoid colon due to diverticulitis ?  Adenocarcinoma of left lung, stage 4 ([place]) ?  Essential hypertension ?  BPH (benign prostatic hyperplasia) ?  Abdominal pain ? ? ?Diverticular abscess, status post drain placement/abdominal cellulitis: Recently treated for this with drain placement and was on  antibiotics.  Wound cultures with multiple organisms including E. coli, Streptococcus agalactiae , Bacteroides, Clostridium.  Finished antibiotics course but came again with chills, drainage, foul smell around the drain.  There was also concern for abdominal cellulitis on admission.  Suspicion for invasion from metastatic lung cancer. ?General surgery, GI were consulted.  No plan for immediate intervention.  Currently on Zosyn. ?There is no evidence of abdominal wall cellulitis.  Patient is clinically much better.  He is aseptic.  Still complains of left lower quadrant pain around the drainage site but there is no erythema or purulent drainage. ?We will check with general surgery about transitioning  antibiotics to oral and discharge planning. ? ?Metastatic adenocarcinoma of left lung: Follows with Dr. Julien Nordmann.  Status post SRS to the metastatic brain lesions as well as whole brain radiation.  Currently on chemotherapeutic agents.  Oncology recommended CT chest with contrast for restaging ? ?Hypertension: Monitor blood pressure.  Currently blood pressure soft but stable,  We will continue to hold home medications ? ?BPH: On Flomax ? ?Obesity: BMI 33 ? ?Goals of care: Extremely poor quality of life due to metastatic lung cancer.  Oncology requested to follow-up.  We have also consulted palliative care ? ? ? ? ?  ?  ? ?DVT prophylaxis:enoxaparin (LOVENOX) injection 40 mg Start: 04/28/21 1000 ? ? ?  Code Status: Full Code ? ?Family Communication: None at the bedside ? ?Patient status:Inpatient ? ?Patient is from :Home ? ?Anticipated discharge TD:DUKG ? ?Estimated DC date:in next 1-2 days ? ? ?Consultants: General surgery, GI ? ?Procedures: None ? ?Antimicrobials:  ?Anti-infectives (From admission, onward)  ? ? Start     Dose/Rate Route Frequency Ordered Stop  ? 04/28/21 2200  meropenem (MERREM) 1 g in sodium chloride 0.9 % 100 mL IVPB  Status:  Discontinued       ? 1 g ?200 mL/hr over 30 Minutes Intravenous Every 8 hours 04/28/21 0551 04/28/21 1252  ? 04/28/21 2200  piperacillin-tazobactam (ZOSYN) IVPB 3.375 g       ? 3.375 g ?12.5 mL/hr over 240 Minutes Intravenous Every 8 hours 04/28/21 1301    ? 04/28/21 1000  fluconazole (DIFLUCAN) tablet 100 mg  Status:  Discontinued       ? 100 mg Oral Daily 04/28/21 0524 04/28/21 0914  ? 04/28/21 0045  ertapenem (INVANZ) 1,000 mg in sodium chloride 0.9 % 100  mL IVPB       ? 1 g ?200 mL/hr over 30 Minutes Intravenous  Once 04/28/21 0035 04/28/21 0155  ? ?  ? ? ?Subjective: ?Patient seen and examined at the bedside this morning.  Hemodynamically stable.  Looks comfortable.  Complains of discomfort on the left lower quadrant around the drain site.  Local area looks good without  any erythema or purulent drainage.  Drain is functioning. ? ?Objective: ?Vitals:  ? 04/28/21 2024 04/28/21 2358 04/29/21 9833 04/29/21 0816  ?BP: 111/71 111/76 133/77 119/76  ?Pulse: 79 78 73 72  ?Resp: 14 14 14    ?Temp: 98.4 ?F (36.9 ?C) 98.6 ?F (37 ?C) 98.2 ?F (36.8 ?C) 98 ?F (36.7 ?C)  ?TempSrc: Oral Oral Oral Oral  ?SpO2: 93% 95% 95% 94%  ?Weight:      ?Height:      ? ? ?Intake/Output Summary (Last 24 hours) at 04/29/2021 1048 ?Last data filed at 04/29/2021 0800 ?Gross per 24 hour  ?Intake 1783.78 ml  ?Output 1400 ml  ?Net 383.78 ml  ? ?Filed Weights  ? 04/27/21 2158  ?Weight: 108 kg  ? ? ?Examination: ? ? ?General exam: Overall comfortable, not in distress, obese ?HEENT: PERRL ?Respiratory system:  no wheezes or crackles  ?Cardiovascular system: S1 & S2 heard, RRR.  ?Gastrointestinal system: Abdomen is nondistended, soft and nontender.  Left lower quadrant drain ?Central nervous system: Alert and oriented ?Extremities: No edema, no clubbing ,no cyanosis ?Skin: No rashes, no ulcers,no icterus   ? ? ?Data Reviewed: I have personally reviewed following labs and imaging studies ? ?CBC: ?Recent Labs  ?Lab 04/27/21 ?1313 04/27/21 ?2215 04/28/21 ?0500  ?WBC 9.1 11.8* 10.6*  ?NEUTROABS 6.9 9.0* 8.2*  ?HGB 10.8* 11.7* 10.5*  ?HCT 34.2* 37.3* 33.9*  ?MCV 90.5 92.6 94.4  ?PLT 244 270 246  ? ?Basic Metabolic Panel: ?Recent Labs  ?Lab 04/27/21 ?1313 04/27/21 ?2215 04/28/21 ?0500  ?NA 141 139 139  ?K 4.0 3.8 4.1  ?CL 104 105 108  ?CO2 27 26 25   ?GLUCOSE 105* 100* 92  ?BUN 11 12 11   ?CREATININE 0.84 0.96 0.80  ?CALCIUM 9.1 9.4 8.9  ?MG  --   --  1.9  ? ? ? ?Recent Results (from the past 240 hour(s))  ?Culture, blood (Routine X 2) w Reflex to ID Panel     Status: None (Preliminary result)  ? Collection Time: 04/28/21  5:16 AM  ? Specimen: BLOOD  ?Result Value Ref Range Status  ? Specimen Description   Final  ?  BLOOD RIGHT ANTECUBITAL ?Performed at [facility], [place] [address], [place], [place] [address] ?  ?  Special Requests   Final  ?  BOTTLES DRAWN AEROBIC AND ANAEROBIC Blood Culture adequate volume ?Performed at [facility], [place] [address] [place], [place] [address] ?  ? Culture   Final  ?  NO GROWTH 1 DAY ?Performed at [facility], [place] [address] [place], [place] [address] ?  ? Report Status PENDING  Incomplete  ?Culture, blood (Routine X 2) w Reflex to ID Panel     Status: None (Preliminary result)  ? Collection Time: 04/28/21 12:26 PM  ? Specimen: BLOOD  ?Result Value Ref Range Status  ? Specimen Description   Final  ?  BLOOD LEFT ANTECUBITAL ?Performed at [facility], [place] [address] [place], [place] [address] ?  ? Special Requests   Final  ?  BOTTLES DRAWN AEROBIC AND ANAEROBIC Blood Culture  adequate volume ?Performed at [facility], [place] [address] [place], [place] [address] ?  ? Culture   Final  ?  NO GROWTH < 24 HOURS ?Performed at [facility], [place] [address], [place], [place] [address] ?  ? Report Status PENDING  Incomplete  ?  ? ?Radiology Studies: ?CT ABDOMEN PELVIS W CONTRAST ? ?Result Date: 04/27/2021 ?CLINICAL DATA:  Left lower quadrant abscess with drainage catheter in place. Increasing drainage. EXAM: CT ABDOMEN AND PELVIS WITH CONTRAST TECHNIQUE: Multidetector CT imaging of the abdomen and pelvis was performed using the standard protocol following bolus administration of intravenous contrast. RADIATION DOSE REDUCTION: This exam was performed according to the departmental dose-optimization program which includes automated exposure control, adjustment of the mA and/or kV according to patient size and/or use of iterative reconstruction technique. CONTRAST:  160mL OMNIPAQUE IOHEXOL 300 MG/ML  SOLN COMPARISON:  04/17/2021 FINDINGS: Lower chest: Numerous pulmonary nodules again seen in the lower lobes, unchanged. No effusions. Heart is normal size. Hepatobiliary: Multiple gallstones within the gallbladder. No biliary ductal  dilatation. No focal hepatic abnormality. Pancreas: No focal abnormality or ductal dilatation. Spleen: No focal abnormality.  Normal size. Adrenals/Urinary Tract: Punctate 1 mm stones in the upper and midpol

## 2021-04-30 ENCOUNTER — Encounter: Payer: Self-pay | Admitting: Internal Medicine

## 2021-04-30 ENCOUNTER — Ambulatory Visit (HOSPITAL_COMMUNITY): Payer: Medicaid Other

## 2021-04-30 ENCOUNTER — Encounter: Payer: Self-pay | Admitting: General Practice

## 2021-04-30 ENCOUNTER — Inpatient Hospital Stay (HOSPITAL_COMMUNITY): Payer: Medicaid Other

## 2021-04-30 DIAGNOSIS — Z7189 Other specified counseling: Secondary | ICD-10-CM

## 2021-04-30 DIAGNOSIS — C3492 Malignant neoplasm of unspecified part of left bronchus or lung: Secondary | ICD-10-CM

## 2021-04-30 DIAGNOSIS — Z515 Encounter for palliative care: Secondary | ICD-10-CM

## 2021-04-30 DIAGNOSIS — K5720 Diverticulitis of large intestine with perforation and abscess without bleeding: Secondary | ICD-10-CM | POA: Diagnosis not present

## 2021-04-30 DIAGNOSIS — L03311 Cellulitis of abdominal wall: Secondary | ICD-10-CM | POA: Diagnosis not present

## 2021-04-30 HISTORY — PX: IR CATHETER TUBE CHANGE: IMG717

## 2021-04-30 LAB — CBC
HCT: 31.7 % — ABNORMAL LOW (ref 39.0–52.0)
Hemoglobin: 10.1 g/dL — ABNORMAL LOW (ref 13.0–17.0)
MCH: 29.6 pg (ref 26.0–34.0)
MCHC: 31.9 g/dL (ref 30.0–36.0)
MCV: 93 fL (ref 80.0–100.0)
Platelets: 261 10*3/uL (ref 150–400)
RBC: 3.41 MIL/uL — ABNORMAL LOW (ref 4.22–5.81)
RDW: 14.7 % (ref 11.5–15.5)
WBC: 10.1 10*3/uL (ref 4.0–10.5)
nRBC: 0 % (ref 0.0–0.2)

## 2021-04-30 LAB — BASIC METABOLIC PANEL
Anion gap: 4 — ABNORMAL LOW (ref 5–15)
BUN: 8 mg/dL (ref 6–20)
CO2: 29 mmol/L (ref 22–32)
Calcium: 8.8 mg/dL — ABNORMAL LOW (ref 8.9–10.3)
Chloride: 107 mmol/L (ref 98–111)
Creatinine, Ser: 0.83 mg/dL (ref 0.61–1.24)
GFR, Estimated: >60 mL/min (ref >60)
Glucose, Bld: 99 mg/dL (ref 70–99)
Potassium: 3.9 mmol/L (ref 3.5–5.1)
Sodium: 140 mmol/L (ref 135–145)

## 2021-04-30 MED ORDER — ENOXAPARIN SODIUM 40 MG/0.4ML IJ SOSY
40.0000 mg | PREFILLED_SYRINGE | INTRAMUSCULAR | Status: DC
Start: 2021-05-01 — End: 2021-05-03
  Administered 2021-05-01 – 2021-05-03 (×3): 40 mg via SUBCUTANEOUS
  Filled 2021-04-30 (×3): qty 0.4

## 2021-04-30 MED ORDER — BISACODYL 10 MG RE SUPP
10.0000 mg | Freq: Once | RECTAL | Status: AC
  Administered 2021-04-30: 10 mg via RECTAL
  Filled 2021-04-30: qty 1

## 2021-04-30 MED ORDER — IOHEXOL 300 MG/ML  SOLN
25.0000 mL | Freq: Once | INTRAMUSCULAR | Status: AC | PRN
  Administered 2021-04-30: 5 mL

## 2021-04-30 MED ORDER — POLYETHYLENE GLYCOL 3350 17 G PO PACK
17.0000 g | PACK | Freq: Every day | ORAL | Status: DC
  Administered 2021-04-30 – 2021-05-02 (×3): 17 g via ORAL
  Filled 2021-04-30 (×3): qty 1

## 2021-04-30 NOTE — Progress Notes (Signed)
? ? ?Referring Physician(s): ?Gerkin,T ? ?Supervising Physician: Mir, Sharen Heck ? ?Patient Status:  [facility] - In-pt ? ?Chief Complaint: ? 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  malignant perforation.  He was admitted to [facility] on 4/25 with increasing abdominal pain, chills and drainage from catheter insertion site.  Past cultures grew E. coli, Clostridium, few Bacteroides.  He is afebrile, with normal WBC, currently on IV Zosyn. Creat nl.  Latest blood cultures negative to date.  CT abdomen pelvis on 4/25 revealed left lower quadrant drain in place adjacent to proximal sigmoid colon with no residual fluid collection surrounding the catheter, left nephrolithiasis, cholelithiasis, no acute findings.  CT chest yesterday revealed:  ? ?1. Increasing size and likely number of multiple pulmonary nodules ?markedly worse when compared to December of 2022. ?2. Signs of skeletal metastatic disease that are new since January ?20 second of 2022. ?3. Decrease in bulk of the dominant mass, is size decrease as well ?as measured in single axial plane and decreased size of mediastinal ?lymph nodes. ?4. LEFT adrenal lesion unchanged. ?5. Cholelithiasis. ? ?Patient currently denies fever, worsening chest pain, dyspnea, cough, nausea, vomiting or bleeding.  He still has a small amount of feculent drainage in gravity bag.  Request now received from surgery for drain downsizing or "backing out" in hopes of allowing fistula to close. ? ? ?Past Medical History:  ?Diagnosis Date  ? Arthritis   ? Baker's cyst   ? right  ?  Diverticulitis   ? GERD (gastroesophageal reflux disease)   ? Heart attack ([place]) 01/04/2004  ? History of hiatal hernia   ? Hypertension   ? Malignant neoplasm of upper lobe of left lung ([place]) 09/24/2020  ? Pneumonia   ? as a child  ? Sleep apnea   ? no longer on Cpap  ? ?Past Surgical History:  ?Procedure Laterality Date  ? BIOPSY  01/26/2021  ? Procedure: BIOPSY;  Surgeon: Ronnette Juniper, MD;  Location: [facility] ENDOSCOPY;  Service: Gastroenterology;;  ? BRONCHIAL BIOPSY  09/14/2020  ? Procedure: BRONCHIAL BIOPSIES;  Surgeon: Collene Gobble, MD;  Location: [facility] ENDOSCOPY;  Service: Cardiopulmonary;;  ? BRONCHIAL BRUSHINGS  09/14/2020  ? Procedure: BRONCHIAL BRUSHINGS;  Surgeon: Collene Gobble, MD;  Location: [facility] ENDOSCOPY;  Service: Cardiopulmonary;;  ? BRONCHIAL NEEDLE ASPIRATION BIOPSY  09/14/2020  ? Procedure: BRONCHIAL NEEDLE ASPIRATION BIOPSIES;  Surgeon: Collene Gobble, MD;  Location[facility] END[facility]COPY;  Service: Cardiopulmonary;;  ? CARDIAC CATHETERIZATION    ? CARPAL TUNNEL RELEASE    ? right  ? COLONOSCOPY WITH PROPOFOL N/A 01/26/2021  ? Procedure: COLONOSCOPY WITH PROPOFOL;  Surgeon: Ronnette Juniper, MD;  Location: [facility] ENDOSCOPY;  Service: Gastroenterology;  Laterality: N/A;  ? FIDUCIAL MARKER PLACEMENT  09/14/2020  ? Procedure: FIDUCIAL MARKER PLACEMENT;  Surgeon: Collene Gobble, MD;  Location: [facility] ENDOSCOPY;  Service: Cardiopulmonary;;  ? HAND SURGERY    ? left  ? IR CATHETER TUBE CHANGE  03/19/2021  ? IR FLUORO RM 30-60 MIN  03/18/2021  ? IR RADIOLOGIST EVAL & MGMT  04/07/2021  ? KNEE SURGERY    ? left  ? VIDEO BRONCHOSCOPY N/A 09/14/2020  ? Procedure: ROBOTIC VIDEO  BRONCHOSCOPY WITH FLUORO;  Surgeon: Collene Gobble, MD;  Location: [facility] ENDOSCOPY;  Service: Cardiopulmonary;  Laterality: N/A;  ? VIDEO BRONCHOSCOPY WITH ENDOBRONCHIAL ULTRASOUND N/A 09/14/2020  ? Procedure: VIDEO BRONCHOSCOPY WITH ENDOBRONCHIAL ULTRASOUND;  Surgeon: Collene Gobble, MD;  Location: [facility] ENDOSCOPY;  Service: Cardiopulmonary;  Laterality: N/A;  ? VIDEO  BRONCHOSCOPY WITH RADIAL ENDOBRONCHIAL ULTRASOUND  09/14/2020  ? Procedure: VIDEO BRONCHOSCOPY WITH RADIAL ENDOBRONCHIAL ULTRASOUND;  Surgeon: Collene Gobble, MD;  Location: [facility] ENDOSCOPY;  Service: Cardiopulmonary;;  ? ? ? ? ? ?Allergies: ?Benadryl [diphenhydramine] ? ?Medications: ?Prior to Admission medications   ?Medication Sig Start Date End Date Taking? Authorizing Provider  ?acetaminophen (TYLENOL) 650 MG CR tablet Take 650 mg by mouth every 8 (eight) hours as needed for pain.   Yes [provider]  ?amLODipine (NORVASC) 10 MG tablet Take 1 tablet by mouth daily. 03/10/21  Yes Mercy Riding, MD  ?carbamide peroxide (DEBROX) 6.5 % OTIC solution Place 5 drops into both ears 3 (three) times a week.   Yes [provider]  ?dexamethasone (DECADRON) 4 MG tablet Please take 2 tablets 2 times a day the day before, the day of, and the day after treatment. 02/15/21  Yes [name], [name], PA-C  ?Ensure Plus (ENSURE PLUS) LIQD Take 237 mLs by mouth 3 (three) times daily between meals. Chocolate   Yes [provider]  ?fluticasone (FLONASE) 50 MCG/ACT nasal spray Place 1 spray into both nostrils daily. 03/16/21  Yes Pickenpack-Cousar, Carlena Sax, NP  ?gabapentin (NEURONTIN) 300 MG capsule Take 2 capsules (600 mg total) by mouth at bedtime. 04/13/21 05/13/21 Yes Pickenpack-Cousar, Carlena Sax, NP  ?guaiFENesin (MUCINEX) 600 MG 12 hr tablet Take 600 mg by mouth 2 (two) times daily.   Yes [provider]  ?memantine (NAMENDA) 10 MG tablet Take 1 tablet (10 mg total) by mouth 2 (two) times daily. 01/26/21  Yes [name], PA-C  ?mirtazapine (REMERON) 30 MG tablet Take 1 tablet (30 mg total) by mouth at bedtime. 04/13/21  Yes [name], [name], PA-C  ?Multiple Vitamin (MULTIVITAMIN WITH MINERALS) TABS tablet Take 1 tablet by mouth daily. 03/10/21  Yes Mercy Riding, MD  ?ondansetron (ZOFRAN) 8 MG tablet Take 1 tablet (8 mg total) by mouth every 8 (eight) hours as needed for  nausea or vomiting. 04/13/21  Yes Pickenpack-Cousar, Carlena Sax, NP  ?oxyCODONE (OXY IR/ROXICODONE) 5 MG immediate release tablet Take 1-2 tablets (5-10 mg total) by mouth every 6 (six) hours as needed for severe pain. 04/13/21  Yes Pickenpack-Cousar, Carlena Sax, NP  ?prochlorperazine (COMPAZINE) 10 MG tablet Take 1 tablet (10 mg total) by mouth every 6 (six) hours as needed for nausea or vomiting. 02/25/21  Yes Pickenpack-Cousar, Athena N, NP  ?senna-docusate (SENNA S) 8.6-50 MG tablet Take 2 tablets by mouth daily. 04/13/21  Yes Pickenpack-Cousar, Carlena Sax, NP  ?vitamin B-12 (CYANOCOBALAMIN) 1000 MCG tablet Take 1,000 mcg by mouth daily.   Yes [provider]  ?Wheat Dextrin (BENEFIBER DRINK MIX) PACK Take 1 Package by mouth daily at 6 (six) AM.   Yes [provider]  ?amoxicillin-clavulanate (AUGMENTIN) 875-125 MG tablet Take 1 tablet by mouth every 12 (twelve) hours. 04/17/21   Regan Lemming, MD  ?fluconazole (DIFLUCAN) 100 MG tablet Take 1 tablet (100 mg total) by mouth daily. ?Patient not taking: Reported on 04/28/2021 04/13/21   [name], [name], PA-C  ?folic acid (FOLVITE) 1 MG tablet Take 1 mg by mouth daily.    [provider]  ?  oxyCODONE-acetaminophen (PERCOCET/ROXICET) 5-325 MG tablet Take 1 tablet by mouth every 8 (eight) hours as needed for severe pain. ?Patient not taking: Reported on 04/28/2021 04/17/21   Regan Lemming, MD  ?tamsulosin (FLOMAX) 0.4 MG CAPS capsule Take 1 capsule (0.4 mg total) by mouth daily after supper. ?Patient not taking: Reported on 04/28/2021 01/27/21   [name], MD  ? ? ? ?Vital Signs: ?BP 128/74 (BP Location: Left Arm)   Pulse 84   Temp 98.3 ?F (36.8 ?C) (Oral)   Resp 18   Ht 5\' 11"  (1.803 m)   Wt 238 lb (108 kg)   SpO2 95%   BMI 33.19 kg/m?  ? ?Physical Exam awake, alert.  Chest clear to auscultation bilaterally anteriorly.  Heart with reg rate and rhythm.  Abdomen soft, positive bowel sounds, left lower quadrant drain intact,  insertion site okay, mild to moderately tender, feculent output in drain bag, statlock device had become detached and it was subsequently removed. ? ?Imaging: ?CT CHEST W CONTRAST ? ?Result Date: 04/29/2021 ?CLINICAL DATA:  60-y

## 2021-04-30 NOTE — Progress Notes (Signed)
[facility] Social Work Progress Notes ? ?Social work Theatre manager met with Mr. Jared in [employer] office, and took a tour through the healing gardens per patient request. Intern offered empathic listening and emotional support as patient expressed challenges as well as gratitude related to symptoms, changing sense of self, and managing uncertainty. Intern and patient also discussed how calming and grounding the gardens and natural world can be, noting how even looking out of a window can help to manage anxiety.  ? ?Intern shared with patient that internship concludes this week and that CSW Trilby Leaver will continue to follow. Pt expressed understanding of the plan. ? ?Rosary Lively, Social Work Intern ?Supervised by Gwinda Maine, LCSW ? ?

## 2021-04-30 NOTE — Progress Notes (Addendum)
? ? ?   ?Subjective: ?CC: ?Stable to slightly improved LLQ pain. Tolerated cld without increased pain, n/v. Passing flatus. No BM.  ? ?Objective: ?Vital signs in last 24 hours: ?Temp:  [98.3 ?F (36.8 ?C)-99.1 ?F (37.3 ?C)] 98.3 ?F (36.8 ?C) (04/28 0407) ?Pulse Rate:  [72-84] 84 (04/28 0407) ?Resp:  [16-18] 18 (04/28 0407) ?BP: (114-128)/(72-81) 128/74 (04/28 0407) ?SpO2:  [94 %-98 %] 95 % (04/28 0407) ?Last BM Date : 04/24/21 ? ?Intake/Output from previous day: ?04/27 0701 - 04/28 0700 ?In: 6 [I.V.:3] ?Out: 0623 [Urine:1140; Drains:50] ?Intake/Output this shift: ?Total I/O ?In: -  ?Out: 200 [Urine:200] ? ?PE: ?Gen:  Alert, NAD, pleasant ?Pulm:  Rate and effort normal ?Abd: Soft, ND, some LLQ tenderness greatest around drain and without peritonitis, +BS. IR Drain w/ thin brown output in gravity bag.  ? ?Lab Results:  ?Recent Labs  ?  04/28/21 ?0500 04/30/21 ?0733  ?WBC 10.6* 10.1  ?HGB 10.5* 10.1*  ?HCT 33.9* 31.7*  ?PLT 246 261  ? ?BMET ?Recent Labs  ?  04/28/21 ?0500 04/30/21 ?0733  ?NA 139 140  ?K 4.1 3.9  ?CL 108 107  ?CO2 25 29  ?GLUCOSE 92 99  ?BUN 11 8  ?CREATININE 0.80 0.83  ?CALCIUM 8.9 8.8*  ? ?PT/INR ?No results for input(s): LABPROT, INR in the last 72 hours. ?CMP  ?   ?Component Value Date/Time  ? NA 140 04/30/2021 0733  ? K 3.9 04/30/2021 0733  ? CL 107 04/30/2021 0733  ? CO2 29 04/30/2021 0733  ? GLUCOSE 99 04/30/2021 0733  ? BUN 8 04/30/2021 0733  ? CREATININE 0.83 04/30/2021 0733  ? CREATININE 0.84 04/27/2021 1313  ? CALCIUM 8.8 (L) 04/30/2021 0733  ? PROT 6.1 (L) 04/28/2021 0500  ? ALBUMIN 3.2 (L) 04/28/2021 0500  ? ALBUMIN 4.0 05/20/2019 1951  ? AST 18 04/28/2021 0500  ? AST 17 04/27/2021 1313  ? ALT 11 04/28/2021 0500  ? ALT 9 04/27/2021 1313  ? ALKPHOS 101 04/28/2021 0500  ? BILITOT 0.8 04/28/2021 0500  ? BILITOT 0.4 04/27/2021 1313  ? GFRNONAA >60 04/30/2021 0733  ? GFRNONAA >60 04/27/2021 1313  ? GFRAA >60 05/25/2019 0427  ? ?Lipase  ?   ?Component Value Date/Time  ? LIPASE 21 03/03/2021 0958   ? ? ?Studies/Results: ?CT CHEST W CONTRAST ? ?Result Date: 04/29/2021 ?CLINICAL DATA:  61 year old male for staging of non-small cell lung cancer. * Tracking Code: BO * EXAM: CT CHEST WITH CONTRAST TECHNIQUE: Multidetector CT imaging of the chest was performed during intravenous contrast administration. RADIATION DOSE REDUCTION: This exam was performed according to the departmental dose-optimization program which includes automated exposure control, adjustment of the mA and/or kV according to patient size and/or use of iterative reconstruction technique. CONTRAST:  19mL OMNIPAQUE IOHEXOL 300 MG/ML  SOLN COMPARISON:  January 24, 2021. FINDINGS: Cardiovascular: Aortic caliber is normal. Heart size is normal. No pericardial effusion. Central pulmonary vasculature unremarkable on venous phase. Mediastinum/Nodes: Slight decrease in size of mediastinal nodal tissue compared to the previous imaging study. Pre-vascular lymph node 11 mm previously 13 mm (image 52/2) Subcarinal nodal tissue 9-10 mm short axis, previously 13-14 mm. Similar behavior of other top-normal size lymph nodes throughout the chest. No thoracic inlet adenopathy. No axillary adenopathy. Esophagus grossly normal. Lungs/Pleura: LEFT pulmonary mass with fiducial marker in the central aspect measuring 5.9 x 4.5 cm remains contiguous with the LEFT hilum. (Image 57/5) decreased bulk of tumor which could be measured in a single plane up to 6.6  x 4.5 cm on the prior study. Innumerable pulmonary nodules. LEFT upper lobe nodule (image 32/5) 10 mm previously 7 mm. LEFT upper lobe nodule (image 39/5) 13 mm previously 11 mm. Satellite nodules are similar. RIGHT lower lobe pulmonary nodule (image 100/5) 8 mm on the prior study, 11 mm on the current exam Respiratory motion limiting assessment of the previous imaging study. Increasing conspicuity of all visible nodules following above nodules. Possibility of new subcentimeter nodules is highly likely though a gang  direct comparison is difficult likely new nodules ranging from 2-3 mm throughout the chest. When compared to the December evaluation there is obvious worsening of disease in the lungs. No signs of pleural effusion or sign of consolidative changes. Upper Abdomen: Cholelithiasis. No acute upper abdominal process to the extent evaluated. LEFT adrenal thickening up to 10 mm is stable. No upper abdominal lymphadenopathy. Musculoskeletal: Cortical disruption of LEFT posterior third rib (image 45/2) lucent area measuring up to 11 mm. Sternal lesion with lytic features (image 69/2) not present in January. Subtle sclerotic and irregular appearing area along the lateral margin of the RIGHT seventh rib with scattered small sclerotic foci also in the RIGHT posterior and lateral sixth rib on image 89/2. Multifocal lytic changes in the thoracic spine, largest area at the T11 level measuring 9 mm (image 134/2. Involvement of T3, T4 and T5 with scattered lytic foci and cortical destruction along the posterior margin of T3 (image 90/7) lytic changes also present in the sternal manubrium. IMPRESSION: 1. Increasing size and likely number of multiple pulmonary nodules markedly worse when compared to December of 2022. 2. Signs of skeletal metastatic disease that are new since January 20 second of 2022. 3. Decrease in bulk of the dominant mass, is size decrease as well as measured in single axial plane and decreased size of mediastinal lymph nodes. 4. LEFT adrenal lesion unchanged. 5. Cholelithiasis. Electronically Signed   By: Zetta Bills M.D.   On: 04/29/2021 11:08   ? ?Anti-infectives: ?Anti-infectives (From admission, onward)  ? ? Start     Dose/Rate Route Frequency Ordered Stop  ? 04/28/21 2200  meropenem (MERREM) 1 g in sodium chloride 0.9 % 100 mL IVPB  Status:  Discontinued       ? 1 g ?200 mL/hr over 30 Minutes Intravenous Every 8 hours 04/28/21 0551 04/28/21 1252  ? 04/28/21 2200  piperacillin-tazobactam (ZOSYN) IVPB 3.375 g        ? 3.375 g ?12.5 mL/hr over 240 Minutes Intravenous Every 8 hours 04/28/21 1301    ? 04/28/21 1000  fluconazole (DIFLUCAN) tablet 100 mg  Status:  Discontinued       ? 100 mg Oral Daily 04/28/21 0524 04/28/21 0914  ? 04/28/21 0045  ertapenem (INVANZ) 1,000 mg in sodium chloride 0.9 % 100 mL IVPB       ? 1 g ?200 mL/hr over 30 Minutes Intravenous  Once 04/28/21 0035 04/28/21 0155  ? ?  ? ? ? ?Assessment/Plan ?Complicated Sigmoid Diverticulitis  ?- S/p IR drain 3/2. Exchanged 3/17. 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  ?- CT 4/25 w/ IR drain in the LLQ adjacent to proximal sigmoid colon without residual fluid collection. No obvious inflammation of the colon on CT but worsening symptoms and readdmitted on IV abx.  ?- As stated by Dr. Barry Dienes - This is a very difficult situation given that he may potentially have a lung cancer metastasis causing this perforation.  The perforation is now  a controlled fistula via the drain.  This is unlikely to heal given his overall health issues.  ?- Discussed case w/ Dr. Harlow Asa - will ask IR to evaluate.  Possibility of downsizing drain or "backing out" drain in hopes of allowing fistula to close ?  ?ID - Invanz/Fluconazole 4/26. Zosyn 4/26 >> Afebrile. WBC normalized.  ?VTE - SCDs, Lovenox ?FEN - IVF, NPO for IR procedure. Okay for FLD, ADAT after ?Foley - none ?  ?Metastatic lung cancer on chemo, whole brain radiation ?Obesity BMI 35.43 ?HTN ?GERD ?OSA ?MI in 2006 ? ? LOS: 2 days  ? ? ?Jillyn Ledger , PA-C ?[facility] ?04/30/2021, 10:02 AM ?Please see Amion for pager number during day hours 7:00am-4:30pm ? ?

## 2021-04-30 NOTE — Consult Note (Signed)
? ?Palliative Care Consult Note  ?                                ?Date: 04/30/2021  ? ?Patient Name: Raymond [name]  ?DOB: 1960-01-17  MRN: [id number]  Age / Sex: 61 y.o., male  ?PCP: Pcp, No ?Referring Physician: Shelly Coss, MD ? ?Reason for Consultation: Establishing goals of care ? ?HPI/Patient Profile: Palliative Care consult requested for goals of care discussion in this 61 y.o. male  with past medical history of   stage IV non-small cell lung cancer, adenocarcinoma of the left lower lobe, mediastinal lymphadenopathy with brain metastasis, s/p carboplatin, Keytruda, and Alimta, stereotactic radiosurgery to brain mets, hypertension, and GERD. Recent drain percutaneous drain placed (3/23) due to diverticular abscess. He was admitted on 04/27/2021 from home with increasing abdominal pain and increased foul smelling drainage from perc drain. He is being followed by surgery with plans on drain revision.  ? ?Past Medical History:  ?Diagnosis Date  ? Arthritis   ? Baker's cyst   ? right  ? Diverticulitis   ? GERD (gastroesophageal reflux disease)   ? Heart attack ([place]) 01/04/2004  ? History of hiatal hernia   ? Hypertension   ? Malignant neoplasm of upper lobe of left lung ([place]) 09/24/2020  ? Pneumonia   ? as a child  ? Sleep apnea   ? no longer on Cpap  ? ? ? ?Subjective:  ? ?This NP Osborne Oman reviewed medical records, received report from team, assessed the patient and then met at the patient's bedside with Raymond [name], and Tomi, RN to discuss diagnosis, prognosis, GOC, EOL wishes disposition and options. ?  ?[name] is well known to myself and the Palliative team. I have been actively following him at [facility] for symptom management and goals of care.  ? ?We discussed His current illness and what it means in the larger context of His on-going co-morbidities. Natural disease trajectory and expectations were discussed. ? ?[name] verbalized  understanding of current illness and co-morbidities. He expresses multiple doctors have provided updates to him since his hospitalization. He knows his long-term prognosis is poor however he wishes to continue to treat the treatable and take things one day at a time.  ? ?His pain is ongoing however controlled on current regimen.  ? ?Unfortunately our conversation was cut short due to him having to go down for drain revision. He is requesting ongoing discussions. I also expressed possibility of his wife being present for further discussions.  ? ?I discussed the importance of continued conversation with family and their medical providers regarding overall plan of care and treatment options, ensuring decisions are within the context of the patients values and GOCs. ? ?Questions and concerns were addressed.  Hard Choices booklet left for review. The family was encouraged to call with questions or concerns.  PMT will continue to support holistically as needed. ? ? ?Objective:  ? ?Primary Diagnoses: ?Present on Admission: ? Abdominal wall cellulitis ? Abscess of sigmoid colon due to diverticulitis ? Essential hypertension ? BPH (benign prostatic hyperplasia) ? Adenocarcinoma of left lung, stage 4 ([place]) ? Abdominal pain ? ? ?Scheduled Meds: ? bisacodyl  10 mg Rectal Once  ? [START ON 05/01/2021] enoxaparin (LOVENOX) injection  40 mg Subcutaneous Q24H  ? fluticasone  1 spray Each Nare Daily  ? gabapentin  600 mg Oral QHS  ? memantine  10 mg  Oral BID  ? mirtazapine  30 mg Oral QHS  ? polyethylene glycol  17 g Oral Daily  ? sodium chloride flush  3 mL Intracatheter Daily  ? ? ?Continuous Infusions: ? piperacillin-tazobactam 3.375 g (04/30/21 0732)  ? ? ?PRN Meds: ?acetaminophen **OR** acetaminophen, alum & mag hydroxide-simeth, hydrALAZINE, HYDROmorphone (DILAUDID) injection **OR** HYDROmorphone (DILAUDID) injection, ondansetron **OR** ondansetron (ZOFRAN) IV ? ?Allergies  ?Allergen Reactions  ? Benadryl [Diphenhydramine]  Itching  ?  01/24/21 pt states this was a one time reaction and that he takes now when needed  ? ? ?Review of Systems  ?HENT:    ?     Chemotherapy induced alopecia   ?Gastrointestinal:  Positive for abdominal pain.  ?     Left percutaneous drain in place   ?Unless otherwise noted, a complete review of systems is negative. ? ?Physical Exam ?General: NAD, chronically-ill appearing ?Cardiovascular: regular rate and rhythm ?Pulmonary:normal breathing pattern  ?Abdomen: soft, tender, + bowel sounds, left perc drain ?Extremities: no edema, no joint deformities ?Skin: no rashes, warm and dry, alopecia  ?Neurological: AAO x3, mood appropriate  ? ?Vital Signs:  ?BP 128/74 (BP Location: Left Arm)   Pulse 84   Temp 98.3 ?F (36.8 ?C) (Oral)   Resp 18   Ht _0  (1.803 m)   Wt 108 kg   SpO2 95%   BMI 33.19 kg/m?  ?Pain Scale: 0-10 ?  ?Pain Score: 7  ? ?SpO2: SpO2: 95 % ?O2 Device:SpO2: 95 % ?O2 Flow Rate: .  ? ?IO: Intake/output summary:  ?Intake/Output Summary (Last 24 hours) at 04/30/2021 1445 ?Last data filed at 04/30/2021 7342 ?Gross per 24 hour  ?Intake 3 ml  ?Output 890 ml  ?Net -887 ml  ? ? ?LBM: Last BM Date : 04/24/21 ?Baseline Weight: Weight: 108 kg ?Most recent weight: Weight: 108 kg     ? ?Palliative Assessment/Data:  ? ? ?Advanced Care Planning:  ? ?Primary Decision Maker: ?PATIENT ? ?Code Status/Advance Care Planning: ?Full code ? ?Assessment & Plan:  ? ?SUMMARY OF RECOMMENDATIONS   ?Continue with current plan of care. [name] expressed understanding of his current illness. He is remaining hopeful for stability. Speaks to his strong Christian faith.  ?We began an open and honest discussion with concerns regarding poor prognosis. Unfortunately patient had to go down for Percutaneous drain revision and our conversations were cut short. He is asking for follow-up with hopes of his wife to be included. I am off service tomorrow and will plan to follow-up on Sunday if he is still hospitalized. If he is  discharged I will follow-up with him at [facility].   ?PMT will continue to support and follow as needed. Please call team line with urgent needs. ? ?Palliative Prophylaxis:  ?Frequent Pain Assessment ? ?Additional Recommendations (Limitations, Scope, Preferences): ?Full Scope Treatment ? ?Psycho-social/Spiritual:  ?Desire for further Chaplaincy support: no ?Additional Recommendations:  ongoing goals of care discussions  ? ?Prognosis:  ?Guarded-Poor  ? ?Discharge Planning:  ?Home with Home Health with outpatient follow-up at [facility].  ? ?Discussed with: RN ? ?Patient  expressed understanding and was in agreement with this plan.  ? ?Time Total: 45 min  ? ?Visit consisted of counseling and education dealing with the complex and emotionally intense issues of symptom management and palliative care in the setting of serious and potentially life-threatening illness.Greater than 50%  of this time was spent counseling and coordinating care related to the above assessment and plan. ? ?Signed by: ? ?  [name], AGPCNP-BC ?Palliative Medicine Team  ?Phone: [phone] ?Pager: [phone] ?Amion: N. Cousar  ? ?Thank you for allowing the Palliative Medicine Team to assist in the care of this patient. Please utilize secure chat with additional questions, if there is no response within 30 minutes please call the above phone number. Palliative Medicine Team providers are available by phone from 7am to 5pm daily and can be reached through the team cell phone.  ?Should this patient require assistance outside of these hours, please call the patient's attending physician. ? ? ? ? ? ? ? ? ? ? ? ? ?  ?

## 2021-04-30 NOTE — Progress Notes (Signed)
?PROGRESS NOTE ? ?Raymond [name]  ONG:[id number] DOB: May 26, 1960 DOA: 04/27/2021 ?PCP: Pcp, No  ? 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  Patient was recently hospitalized here in March, treated for diverticulitis of sigmoid colon with abscess ,s/p  drain placement and was treated with antibiotics.  He finished antibiotics about a week ago.  General surgery, GI were consulted.  Currently on zosyn.  Clinically improving, much better now.  IR was consulted by general surgery for consideration of downsizing of the drain. ? ?Assessment & Plan: ? ?Principal Problem: ?  Abdominal wall cellulitis ?Active Problems: ?  Abscess of sigmoid colon due to diverticulitis ?  Adenocarcinoma of left lung, stage 4 ([place]) ?  Essential hypertension ?  BPH (benign prostatic hyperplasia) ?  Abdominal pain ? ? ?Diverticular abscess, status post drain placement/abdominal cellulitis: Recently treated for this with drain placement and was on  antibiotics.  Wound cultures with multiple organisms including E. coli, Streptococcus agalactiae , Bacteroides, Clostridium.  Finished antibiotics course but came again with chills, drainage, foul smell around the drain.  There was also concern for abdominal cellulitis on admission and fistula formation.  There was also suspicion for invasion from metastatic lung cancer. ?General surgery, GI were consulted.  No plan for immediate intervention.  Currently on Zosyn. ?There is no evidence of abdominal wall cellulitis.  Patient is clinically much better.  He is aseptic.  Still complains of left lower  quadrant pain around the drainage site but there is no erythema or purulent drainage. IR was consulted by general surgery for consideration of downsizing of the drain. ?We will check with general surgery about transitioning antibiotics to oral and discharge planning when appropriate. ? ?Metastatic adenocarcinoma of left lung: Follows with Dr. Julien Nordmann.  Status post SRS to the metastatic brain lesions as well as whole brain radiation.  Currently on chemotherapeutic agents.  Oncology recommended CT chest with contrast for restaging which showed increasing size and likely number of multiple pulmonary nodules ?markedly worse when compared to December of 2022.  Also showed signs of skeletal metastatic disease  ? ?Hypertension: Monitor blood pressure.  Currently blood pressure soft but stable,  We will continue to hold home medications ? ?BPH: On Flomax ? ?Obesity: BMI 33 ? ?Goals of care: Extremely poor quality of life due to metastatic lung cancer.  Oncology requested to follow-up.  We have also consulted palliative care ? ? ? ? ?  ?  ? ?DVT prophylaxis:enoxaparin (LOVENOX) injection 40 mg Start: 05/01/21 1000 ? ? ?  Code Status: Full Code ? ?Family Communication: None at the bedside ? ?Patient status:Inpatient ? ?Patient is from :Home ? ?Anticipated discharge GM:[facility] ? ?Estimated DC date:in next 1-2 days, needs general surgery clearance before discharge ? ? ?Consultants: General surgery, GI ? ?Procedures: None ? ?Antimicrobials:  ?Anti-infectives (From admission, onward)  ? ? Start     Dose/Rate Route Frequency Ordered Stop  ? 04/28/21 2200  meropenem (MERREM) 1 g in sodium chloride 0.9 % 100 mL IVPB  Status:  Discontinued       ? 1 g ?200 mL/hr over 30 Minutes Intravenous Every 8 hours 04/28/21 0551 04/28/21 1252  ? 04/28/21 2200  piperacillin-tazobactam (ZOSYN) IVPB 3.375 g       ? 3.375 g ?12.5 mL/hr over 240 Minutes Intravenous Every 8 hours 04/28/21 1301    ? 04/28/21 1000  fluconazole (DIFLUCAN) tablet 100 mg   Status:  Discontinued       ? 100 mg Oral Daily 04/28/21 0524 04/28/21 0914  ? 04/28/21 0045  ertapenem (INVANZ) 1,000 mg in sodium chloride 0.9 % 100 mL IVPB       ? 1 g ?200 mL/hr over 30 Minutes Intravenous  Once 04/28/21 0035 04/28/21 0155  ? ?  ? ? ?Subjective: ?Patient seen and examined at the bedside this morning.  He appears comfortable.  Continues to feel better.  Denies any worsening abdominal pain. ? ?Objective: ?Vitals:  ? 04/29/21 0816 04/29/21 1433 04/29/21 2121 04/30/21 0407  ?BP: 119/76 125/72 114/81 128/74  ?Pulse: 72 72 82 84  ?Resp:  16 18 18   ?Temp: 98 ?F (36.7 ?C) 98.5 ?F (36.9 ?C) 99.1 ?F (37.3 ?C) 98.3 ?F (36.8 ?C)  ?TempSrc: Oral  Oral Oral  ?SpO2: 94% 94% 98% 95%  ?Weight:      ?Height:      ? ? ?Intake/Output Summary (Last 24 hours) at 04/30/2021 1110 ?Last data filed at 04/30/2021 0962 ?Gross per 24 hour  ?Intake 6 ml  ?Output 890 ml  ?Net -884 ml  ? ?Filed Weights  ? 04/27/21 2158  ?Weight: 108 kg  ? ? ?Examination: ? ? ?General exam: Overall comfortable, not in distress,obese ?HEENT: PERRL ?Respiratory system:  no wheezes or crackles  ?Cardiovascular system: S1 & S2 heard, RRR.  ?Gastrointestinal system: Abdomen is nondistended, soft and nontender.  Left lower quadrant drain ?Central nervous system: Alert and oriented ?Extremities: No edema, no clubbing ,no cyanosis ?Skin: No rashes, no ulcers,no icterus   ? ? ?Data Reviewed: I have personally reviewed following labs and imaging studies ? ?CBC: ?Recent Labs  ?Lab 04/27/21 ?1313 04/27/21 ?2215 04/28/21 ?0500 04/30/21 ?0733  ?WBC 9.1 11.8* 10.6* 10.1  ?NEUTROABS 6.9 9.0* 8.2*  --   ?HGB 10.8* 11.7* 10.5* 10.1*  ?HCT 34.2* 37.3* 33.9* 31.7*  ?MCV 90.5 92.6 94.4 93.0  ?PLT 244 270 246 261  ? ?Basic Metabolic Panel: ?Recent Labs  ?Lab 04/27/21 ?1313 04/27/21 ?2215 04/28/21 ?0500 04/30/21 ?0733  ?NA 141 139 139 140  ?K 4.0 3.8 4.1 3.9  ?CL 104 105 108 107  ?CO2 27 26 25 29   ?GLUCOSE 105* 100* 92 99  ?BUN 11 12 11 8   ?CREATININE 0.84 0.96 0.80  0.83  ?CALCIUM 9.1 9.4 8.9 8.8*  ?MG  --   --  1.9  --   ? ? ? ?Recent Results (from the past 240 hour(s))  ?Culture, blood (Routine X 2) w Reflex to ID Panel     Status: None (Preliminary result)  ? Collection Time: 04/28/21  5:16 AM  ? Specimen: BLOOD  ?Result Value Ref Range Status  ? Specimen Description   Final  ?  BLOOD RIGHT ANTECUBITAL ?Performed at [facility], [place] [address], [place], [place] [address] ?  ? Special Requests   Final  ?  BOTTLES DRAWN AEROBIC AND ANAEROBIC Blood Culture adequate volume ?Performed at [facility], [place] [address], [place], [place] [address] ?  ? Culture   Final  ?  NO GROWTH 2 DAYS ?Performed at [facility], [place] [address] [place], [place] [address] ?  ? Report Status PENDING  Incomplete  ?Culture, blood (Routine X 2)  w Reflex to ID Panel     Status: None (Preliminary result)  ? Collection Time: 04/28/21 12:26 PM  ? Specimen: BLOOD  ?Result Value Ref Range Status  ? Specimen Description   Final  ?  BLOOD LEFT ANTECUBITAL ?Performed at [facility], [place] [address] [place], [place] [address] ?  ? Special Requests   Final  ?  BOTTLES DRAWN AEROBIC AND ANAEROBIC Blood Culture adequate volume ?Performed at [facility], [place] [address] [place], [place] [address] ?  ? Culture   Final  ?  NO GROWTH 2 DAYS ?Performed at [facility], [place] [address], [place], [place] [address] ?  ? Report Status PENDING  Incomplete  ?  ? ?Radiology Studies: ?CT CHEST W CONTRAST ? ?Result Date: 04/29/2021 ?CLINICAL DATA:  61 year old male for staging of non-small cell lung cancer. * Tracking Code: BO * EXAM: CT CHEST WITH CONTRAST TECHNIQUE: Multidetector CT imaging of the chest was performed during intravenous contrast administration. RADIATION DOSE REDUCTION: This exam was performed according to the departmental dose-optimization program which includes automated exposure control, adjustment of the mA and/or kV  according to patient size and/or use of iterative reconstruction technique. CONTRAST:  36mL OMNIPAQUE IOHEXOL 300 MG/ML  SOLN COMPARISON:  January 24, 2021. FINDINGS: Cardiovascular: Aortic caliber is normal. He

## 2021-04-30 NOTE — Progress Notes (Signed)
[place] Spiritual Care Note ? ?Visited Raymond [name] while he is inpatient at [facility]. His affect was almost as buoyant as ever, as he shared how grateful he is to be feeling better. He remains upbeat and optimistic about his future, planning on participating in healing garden programs and the [employer].  ? ?Brought a healing shawl and blessing pillow from the [facility] team. Provided empathic listening and emotional support, serving as a witness to his story and experience. Will continue to follow for spiritual and emotional support. ? ? ?Chaplain Lorrin Jackson, MDiv, [facility] ?Pager [phone] ?Voicemail [phone]  ?

## 2021-05-01 DIAGNOSIS — L03311 Cellulitis of abdominal wall: Secondary | ICD-10-CM | POA: Diagnosis not present

## 2021-05-01 NOTE — Progress Notes (Signed)
? ?PROGRESS NOTE ? ?Raymond [name]:[id number] DOB: 03/31/1960 DOA: 04/27/2021 ?PCP: Pcp, No ? 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  Recently hospitalized here in March, treated for diverticulitis of sigmoid colon with abscess, s/p  drain placement and was treated with antibiotics.  He finished his course of antibiotics about a week ago.  Seen by general surgery, GI and medical oncology.  Currently on Zosyn empirically.  IR was consulted by general surgery for consideration of down sizing of the drain.  He had successful drain exchange on 04/30/2021. ? ?05/01/2021: Patient was seen at his bedside this morning.  He had minimal left lower quadrant abdominal pain. ?  ? ?Assessment/Plan: ?Principal Problem: ?  Abdominal wall cellulitis ?Active Problems: ?  Abscess of sigmoid colon due to diverticulitis ?  Adenocarcinoma of left lung, stage 4 ([place]) ?  Essential hypertension ?  BPH (benign prostatic hyperplasia) ?  Abdominal pain ? ?Complicated sigmoid diverticulitis with diverticular abscess, status post drain placement/abdominal cellulitis:  ?Drain exchanged by IR on 04/30/2021, downsized drain.   ?Recently treated for this with drain placement and was on  antibiotics.   ?Wound cultures with multiple organisms including E. coli, Streptococcus agalactiae, Bacteroides, Clostridium.   ?Finished antibiotics course but came again with chills, drainage, foul smell around the drain.   ?There was also concern for abdominal cellulitis on admission and fistula formation.   ?There was also suspicion for invasion from metastatic lung  cancer. ?Continue Zosyn and pain management. ?Appreciate specialist assistance. ?  ?Metastatic adenocarcinoma of left lung:  ?Follows with Dr. Julien Nordmann.   ?Status post SRS to the metastatic brain lesions as well as whole brain radiation. Currently on chemotherapeutic agents.  Oncology recommended CT chest with contrast for restaging which showed increasing size and likely number of multiple pulmonary nodules ?markedly worse when compared to December of 2022.  Also showed signs of skeletal metastatic disease  ?Management per medical oncology. ?  ?Hypertension:  ?BP is at goal 127/71.   ?Continue to hold off home oral antihypertensives. ?Continue to monitor vital signs. ?  ?BPH:  ?Continue home Flomax ?Continue to monitor urine output.  ?  ?Obesity: BMI 33 ?  ?Goals of care: Extremely poor quality of life due to metastatic lung cancer. ?Palliative care team following. ?Patient is currently full code.   ?  ?  ?  ?  ?  ?DVT prophylaxis:enoxaparin (LOVENOX) injection 40 mg Start: 05/01/21 1000 ?  ?  ?  Code Status: Full Code ?  ?Family Communication: None at the bedside ?  ?Patient status:Inpatient ?  ?Patient is from :Home ?  ?Anticipated discharge BT:[facility] ?  ?Estimated DC date: Plan to discharge after general surgery signed off and if the patient remains hemodynamically stable. ?  ?Consultants: General surgery, GI, medical oncology, palliative care team. ?  ?Procedures: Drain exchange. ?  ?Antimicrobials:  ?Anti-infectives (From admission, onward)  ?  ?  ?  Start     Dose/Rate Route Frequency Ordered Stop  ?  04/28/21 2200   meropenem (MERREM) 1 g in sodium chloride 0.9 % 100 mL IVPB  Status:  Discontinued       ? 1 g ?200 mL/hr over 30  Minutes Intravenous Every 8 hours 04/28/21 0551 04/28/21 1252  ?  04/28/21 2200   piperacillin-tazobactam (ZOSYN) IVPB 3.375 g       ? 3.375 g ?12.5 mL/hr over 240 Minutes Intravenous Every 8 hours 04/28/21 1301    ?  04/28/21 1000   fluconazole (DIFLUCAN) tablet 100 mg  Status:   Discontinued       ? 100 mg Oral Daily 04/28/21 0524 04/28/21 0914  ?  04/28/21 0045   ertapenem (INVANZ) 1,000 mg in sodium chloride 0.9 % 100 mL IVPB       ? 1 g ?200 mL/hr over 30 Minutes Intravenous  Once 04/28/21 0035 04/28/21 0155  ?  ?   ?  ?  ? ? ?Objective: ?Vitals:  ? 04/30/21 1824 04/30/21 2045 05/01/21 0458 05/01/21 0459  ?BP: 128/65 123/76 127/71   ?Pulse: 76 87 75 77  ?Resp: 16 18 18    ?Temp: 98.1 ?F (36.7 ?C) 98.5 ?F (36.9 ?C) 98 ?F (36.7 ?C)   ?TempSrc: Oral Oral Oral   ?SpO2: 93% 93% (!) 87% 92%  ?Weight:      ?Height:      ? ? ?Intake/Output Summary (Last 24 hours) at 05/01/2021 0939 ?Last data filed at 05/01/2021 2595 ?Gross per 24 hour  ?Intake 480 ml  ?Output 200 ml  ?Net 280 ml  ? ?Filed Weights  ? 04/27/21 2158  ?Weight: 108 kg  ? ? ?Exam: ? ?General: 61 y.o. year-old male well developed well nourished in no acute distress.  Alert and oriented x3. ?Cardiovascular: Regular rate and rhythm with no rubs or gallops.  No thyromegaly or JVD noted.   ?Respiratory: Clear to auscultation with no wheezes or rales. Good inspiratory effort. ?Abdomen: Soft mildly tender left lower quadrant.  Bowel sounds present.  Left-sided drain in place.   ?Musculoskeletal: No lower extremity edema. 2/4 pulses in all 4 extremities. ?Skin: No ulcerative lesions noted or rashes ?Psychiatry: Mood is appropriate for condition and setting ? ? ?Data Reviewed: ?CBC: ?Recent Labs  ?Lab 04/27/21 ?1313 04/27/21 ?2215 04/28/21 ?0500 04/30/21 ?0733  ?WBC 9.1 11.8* 10.6* 10.1  ?NEUTROABS 6.9 9.0* 8.2*  --   ?HGB 10.8* 11.7* 10.5* 10.1*  ?HCT 34.2* 37.3* 33.9* 31.7*  ?MCV 90.5 92.6 94.4 93.0  ?PLT 244 270 246 261  ? ?Basic Metabolic Panel: ?Recent Labs  ?Lab 04/27/21 ?1313 04/27/21 ?2215 04/28/21 ?0500 04/30/21 ?0733  ?NA 141 139 139 140  ?K 4.0 3.8 4.1 3.9  ?CL 104 105 108 107  ?CO2 27 26 25 29   ?GLUCOSE 105* 100* 92 99  ?BUN 11 12 11 8   ?CREATININE 0.84 0.96 0.80 0.83  ?CALCIUM 9.1 9.4 8.9 8.8*  ?MG  --   --  1.9  --    ? ?GFR: ?Estimated Creatinine Clearance: 121.3 mL/min (by C-G formula based on SCr of 0.83 mg/dL). ?Liver Function Tests: ?Recent Labs  ?Lab 04/27/21 ?1313 04/27/21 ?2215 04/28/21 ?0500  ?AST 17 21 18   ?ALT 9 14 11   ?ALKPHOS 113 113 101  ?BILITOT 0.4 0.8 0.8  ?PROT 6.6 7.2 6.1*  ?ALBUMIN 3.6 3.5 3.2*  ? ?No results for input(s): LIPASE, AMYLASE in the last 168 hours. ?No results for input(s): AMMONIA in the last 168 hours. ?Coagulation Profile: ?No results for input(s): INR, PROTIME in the last 168 hours. ?Cardiac Enzymes: ?No results for input(s): CKTOTAL, CKMB, CKMBINDEX, TROPONINI in the last 168 hours. ?BNP (last 3 results) ?No results for input(s): PROBNP in the last 8760 hours. ?HbA1C: ?No results  for input(s): HGBA1C in the last 72 hours. ?CBG: ?Recent Labs  ?Lab 04/28/21 ?1223 04/28/21 ?1752  ?GLUCAP 81 113*  ? ?Lipid Profile: ?No results for input(s): CHOL, HDL, LDLCALC, TRIG, CHOLHDL, LDLDIRECT in the last 72 hours. ?Thyroid Function Tests: ?No results for input(s): TSH, T4TOTAL, FREET4, T3FREE, THYROIDAB in the last 72 hours. ?Anemia Panel: ?No results for input(s): VITAMINB12, FOLATE, FERRITIN, TIBC, IRON, RETICCTPCT in the last 72 hours. ?Urine analysis: ?   ?Component Value Date/Time  ? COLORURINE AMBER (A) 04/28/2021 0000  ? APPEARANCEUR CLEAR 04/28/2021 0000  ? LABSPEC >1.046 (H) 04/28/2021 0000  ? PHURINE 5.0 04/28/2021 0000  ? GLUCOSEU NEGATIVE 04/28/2021 0000  ? HGBUR NEGATIVE 04/28/2021 0000  ? HGBUR moderate 10/17/2007 1442  ? [place] NEGATIVE 04/28/2021 0000  ? [place] NEGATIVE 04/28/2021 0000  ? PROTEINUR NEGATIVE 04/28/2021 0000  ? UROBILINOGEN 1.0 07/17/2013 1048  ? NITRITE NEGATIVE 04/28/2021 0000  ? LEUKOCYTESUR NEGATIVE 04/28/2021 0000  ? ?Sepsis Labs: ?@LABRCNTIP (procalcitonin:4,lacticidven:4) ? ?) ?Recent Results (from the past 240 hour(s))  ?Culture, blood (Routine X 2) w Reflex to ID Panel     Status: None (Preliminary result)  ? Collection Time: 04/28/21  5:16 AM  ? Specimen:  BLOOD  ?Result Value Ref Range Status  ? Specimen Description   Final  ?  BLOOD RIGHT ANTECUBITAL ?Performed at [facility], [place] [address] [place], [place] [address] ?  ? Special Requests   Final  ?  BOTTLES DRAW

## 2021-05-01 NOTE — TOC Initial Note (Signed)
Transition of Care (TOC) - Initial/Assessment Note  ? ? ?Patient Details  ?Name: Raymond [name] ?MRN: [id number] ?Date of Birth: Mar 08, 1960 ? ?Transition of Care (TOC) CM/SW Contact:    ?Tawanna Cooler, RN ?Phone Number: ?05/01/2021, 5:24 PM ? ?Clinical Narrative:                 ? ?From home with spouse.  Lung cancer with mets.  ?TOC following for discharge needs.  ? ? ?Expected Discharge Plan: Home/Self Care ?Barriers to Discharge: Continued Medical Work up ? ? ?Expected Discharge Plan and Services ?Expected Discharge Plan: Home/Self Care ?  ?  ?  ?Living arrangements for the past 2 months: Apartment ?                ?   ? ?Prior Living Arrangements/Services ?Living arrangements for the past 2 months: Apartment ?Lives with:: Spouse ?  ?       ?Activities of Daily Living ?Home Assistive Devices/Equipment: Cane (specify quad or straight), Walker (specify type) ?ADL Screening (condition at time of admission) ?Patient's cognitive ability adequate to safely complete daily activities?: Yes ?Is the patient deaf or have difficulty hearing?: No ?Does the patient have difficulty seeing, even when wearing glasses/contacts?: No ?Does the patient have difficulty concentrating, remembering, or making decisions?: No ?Patient able to express need for assistance with ADLs?: Yes ?Does the patient have difficulty dressing or bathing?: No ?Independently performs ADLs?: Yes (appropriate for developmental age) ?Does the patient have difficulty walking or climbing stairs?: Yes ?Weakness of Legs: None ?Weakness of Arms/Hands: None ? ?   ? ?Emotional Assessment ?   ?Psych Involvement: No (comment) ? ?Admission diagnosis:  Abdominal pain [R10.9] ?Left lower quadrant abdominal pain [R10.32] ?Patient Active Problem List  ? Diagnosis Date Noted  ? Abdominal wall cellulitis 04/28/2021  ? BPH (benign prostatic hyperplasia) 04/28/2021  ? Abdominal pain 04/28/2021  ? Oral thrush 04/13/2021  ? Decreased appetite 04/13/2021  ? Malignant neoplasm  metastatic to brain [facility]) 03/09/2021  ? At risk for sleep apnea 03/08/2021  ? Blister of skin 03/07/2021  ? Bandemia 03/06/2021  ? Normocytic anemia 03/05/2021  ? Cognitive impairment 03/04/2021  ? Diverticulitis of intestine with abscess 03/03/2021  ? Abscess of sigmoid colon due to diverticulitis 03/03/2021  ? Anxiety and depression 03/03/2021  ? 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 Hearing loss 03/01/2021  ? Lower urinary tract symptoms (LUTS) 01/27/2021  ? RLS (restless legs syndrome) 01/27/2021  ? Colitis presumed infectious 01/25/2021  ? Hypokalemia 01/25/2021  ? Sepsis ([place]) 01/24/2021  ? Essential hypertension 11/02/2020  ? Adenocarcinoma of left lung, stage 4 ([place]) 10/05/2020  ? Malignant neoplasm of upper lobe of left lung ([place]) 09/24/2020  ? Goals of care, counseling/discussion 09/24/2020  ? Mass of left lung 09/14/2020  ? Hx of diverticulitis of colon 05/21/2019  ? 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 Baker cyst 02/28/2015  ? Chest pain, exertional 02/28/2015  ? Baker's cyst 02/28/2015  ? Abnormal EKG 02/28/2015  ? MICROSCOPIC HEMATURIA 10/17/2007  ? ABDOMINAL PAIN 10/17/2007  ? HEMATURIA, HX OF 10/17/2007  ? PSYCHOLOGICAL STRESS 05/31/2007  ? Obesity, unspecified 04/19/2007  ? TOBACCO ABUSE 04/19/2007  ? RHINITIS, ALLERGIC NOS 04/19/2006  ? HYPERTENSION, BENIGN 03/09/2006  ? FATIGUE 03/09/2006  ? WEIGHT GAIN 03/09/2006  ? HEADACHE 03/09/2006  ? ANXIETY 03/02/2006  ? Chest pain 03/02/2006  ? ?PCP:  Pcp, No ?Pharmacy:   ?[facility] ?[address] ?[place] [address] ?Phone:  [phone] Fax: [phone] ? ? ?Readmission Risk Interventions ? ?  03/04/2021  ? 11:02 AM  ?Readmission Risk Prevention Plan  ?Transportation Screening Complete  ?PCP or Specialist Appt within 3-5 Days Complete  ?[place] or Home Care Consult Complete  ?Social Work Consult for [place] Planning/Counseling Complete  ?Palliative Care Screening Not Applicable  ?Medication Review Human resources officer) Complete  ? ? ? ?

## 2021-05-01 NOTE — Progress Notes (Signed)
? ?Subjective/Chief Complaint: ?Successful drain exchange yesterday - downsized drain ?Still with some LLQ discomfort but improved ?BM this morning ? ? ?Objective: ?Vital signs in last 24 hours: ?Temp:  [98 ?F (36.7 ?C)-98.5 ?F (36.9 ?C)] 98 ?F (36.7 ?C) (04/29 0458) ?Pulse Rate:  [75-87] 77 (04/29 0459) ?Resp:  [16-18] 18 (04/29 0458) ?BP: (123-128)/(65-76) 127/71 (04/29 0458) ?SpO2:  [87 %-93 %] 92 % (04/29 0459) ?Last BM Date : 04/27/21 ? ?Intake/Output from previous day: ?04/28 0701 - 04/29 0700 ?In: 480 [P.O.:480] ?Out: 400 [Urine:400] ?Intake/Output this shift: ?No intake/output data recorded. ? ?Gen:  Alert, NAD, pleasant ?Pulm:  Rate and effort normal ?Abd: Soft, ND, some LLQ tenderness greatest around drain and without peritonitis, +BS. IR Drain w/ thin brown output in gravity bag.  ?Less tender than yesterday, per patient. ? ?Lab Results:  ?Recent Labs  ?  04/30/21 ?0733  ?WBC 10.1  ?HGB 10.1*  ?HCT 31.7*  ?PLT 261  ? ?BMET ?Recent Labs  ?  04/30/21 ?0733  ?NA 140  ?K 3.9  ?CL 107  ?CO2 29  ?GLUCOSE 99  ?BUN 8  ?CREATININE 0.83  ?CALCIUM 8.8*  ? ?PT/INR ?No results for input(s): LABPROT, INR in the last 72 hours. ?ABG ?No results for input(s): PHART, HCO3 in the last 72 hours. ? ?Invalid input(s): PCO2, PO2 ? ?Studies/Results: ?CT CHEST W CONTRAST ? ?Result Date: 04/29/2021 ?CLINICAL DATA:  61 year old male for staging of non-small cell lung cancer. * Tracking Code: BO * EXAM: CT CHEST WITH CONTRAST TECHNIQUE: Multidetector CT imaging of the chest was performed during intravenous contrast administration. RADIATION DOSE REDUCTION: This exam was performed according to the departmental dose-optimization program which includes automated exposure control, adjustment of the mA and/or kV according to patient size and/or use of iterative reconstruction technique. CONTRAST:  57mL OMNIPAQUE IOHEXOL 300 MG/ML  SOLN COMPARISON:  January 24, 2021. FINDINGS: Cardiovascular: Aortic caliber is normal. Heart size is  normal. No pericardial effusion. Central pulmonary vasculature unremarkable on venous phase. Mediastinum/Nodes: Slight decrease in size of mediastinal nodal tissue compared to the previous imaging study. Pre-vascular lymph node 11 mm previously 13 mm (image 52/2) Subcarinal nodal tissue 9-10 mm short axis, previously 13-14 mm. Similar behavior of other top-normal size lymph nodes throughout the chest. No thoracic inlet adenopathy. No axillary adenopathy. Esophagus grossly normal. Lungs/Pleura: LEFT pulmonary mass with fiducial marker in the central aspect measuring 5.9 x 4.5 cm remains contiguous with the LEFT hilum. (Image 57/5) decreased bulk of tumor which could be measured in a single plane up to 6.6 x 4.5 cm on the prior study. Innumerable pulmonary nodules. LEFT upper lobe nodule (image 32/5) 10 mm previously 7 mm. LEFT upper lobe nodule (image 39/5) 13 mm previously 11 mm. Satellite nodules are similar. RIGHT lower lobe pulmonary nodule (image 100/5) 8 mm on the prior study, 11 mm on the current exam Respiratory motion limiting assessment of the previous imaging study. Increasing conspicuity of all visible nodules following above nodules. Possibility of new subcentimeter nodules is highly likely though a gang direct comparison is difficult likely new nodules ranging from 2-3 mm throughout the chest. When compared to the December evaluation there is obvious worsening of disease in the lungs. No signs of pleural effusion or sign of consolidative changes. Upper Abdomen: Cholelithiasis. No acute upper abdominal process to the extent evaluated. LEFT adrenal thickening up to 10 mm is stable. No upper abdominal lymphadenopathy. Musculoskeletal: Cortical disruption of LEFT posterior third rib (image 45/2) lucent area measuring up to  11 mm. Sternal lesion with lytic features (image 69/2) not present in January. Subtle sclerotic and irregular appearing area along the lateral margin of the RIGHT seventh rib with  scattered small sclerotic foci also in the RIGHT posterior and lateral sixth rib on image 89/2. Multifocal lytic changes in the thoracic spine, largest area at the T11 level measuring 9 mm (image 134/2. Involvement of T3, T4 and T5 with scattered lytic foci and cortical destruction along the posterior margin of T3 (image 90/7) lytic changes also present in the sternal manubrium. IMPRESSION: 1. Increasing size and likely number of multiple pulmonary nodules markedly worse when compared to December of 2022. 2. Signs of skeletal metastatic disease that are new since January 20 second of 2022. 3. Decrease in bulk of the dominant mass, is size decrease as well as measured in single axial plane and decreased size of mediastinal lymph nodes. 4. LEFT adrenal lesion unchanged. 5. Cholelithiasis. Electronically Signed   By: Zetta Bills M.D.   On: 04/29/2021 11:08  ? ?IR Catheter Tube Change ? ?Result Date: 04/30/2021 ?INDICATION: 61 year old gentleman with left lower quadrant diverticular abscess drain and chronic fistula presents to IR for downsizing of drain. Drain initially placed on 03/04/2021. EXAM: Fluoroscopic drain exchange MEDICATIONS: None ANESTHESIA/SEDATION: None COMPLICATIONS: None immediate. PROCEDURE: Informed written consent was obtained from the patient after a thorough discussion of the procedural risks, benefits and alternatives. All questions were addressed. Maximal Sterile Barrier Technique was utilized including caps, mask, sterile gowns, sterile gloves, sterile drape, hand hygiene and skin antiseptic. A timeout was performed prior to the initiation of the procedure. Patient positioned supine on the procedure table. The external segment of the left lower quadrant drain and surrounding skin prepped and draped usual fashion. Contrast administered through the drain under fluoroscopy again showed fistulous communication with the colon. The existing 12.0 French drain was cut and removed over 0.035 inch  guidewire. New 10.2 French multipurpose pigtail drain was inserted over a guidewire and positioned within the left lower quadrant. Final position confirmed with fluoroscopy. Drain flushed and attached to bag. Drain secured to skin with StatLock and covered with sterile dressing. IMPRESSION: Successful downsizing of left lower quadrant abscess drain from 12.0 [place] to 10.2 [place] given chronic fistulous communication. PLAN: Please contact IR when patient is ready for discharge so that follow-up appointment can be established for clinic. Electronically Signed   By: Miachel Roux M.D.   On: 04/30/2021 16:43   ? ?Anti-infectives: ?Anti-infectives (From admission, onward)  ? ? Start     Dose/Rate Route Frequency Ordered Stop  ? 04/28/21 2200  meropenem (MERREM) 1 g in sodium chloride 0.9 % 100 mL IVPB  Status:  Discontinued       ? 1 g ?200 mL/hr over 30 Minutes Intravenous Every 8 hours 04/28/21 0551 04/28/21 1252  ? 04/28/21 2200  piperacillin-tazobactam (ZOSYN) IVPB 3.375 g       ? 3.375 g ?12.5 mL/hr over 240 Minutes Intravenous Every 8 hours 04/28/21 1301    ? 04/28/21 1000  fluconazole (DIFLUCAN) tablet 100 mg  Status:  Discontinued       ? 100 mg Oral Daily 04/28/21 0524 04/28/21 0914  ? 04/28/21 0045  ertapenem (INVANZ) 1,000 mg in sodium chloride 0.9 % 100 mL IVPB       ? 1 g ?200 mL/hr over 30 Minutes Intravenous  Once 04/28/21 0035 04/28/21 0155  ? ?  ? ? ?Assessment/Plan: ?Complicated Sigmoid Diverticulitis  ?- S/p IR drain 3/2. Exchanged 3/17.  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 Exchanged 04/30/21. ?- CT 4/25 w/ IR drain in the LLQ adjacent to proximal sigmoid colon without residual fluid collection. No obvious inflammation of the colon on CT but worsening symptoms and readdmitted on IV abx.  ?- As stated by Dr. Barry Dienes - This is a very difficult situation given that he may potentially have a lung cancer metastasis causing this perforation.  The perforation is  now a controlled fistula via the drain.  This is unlikely to heal given his overall health issues.  ? ?  ?ID - Invanz/Fluconazole 4/26. Zosyn 4/26 >> Afebrile. WBC normalized.  ?VTE - SCDs, Lovenox ?FEN - IVF, FLD,

## 2021-05-02 DIAGNOSIS — R1032 Left lower quadrant pain: Secondary | ICD-10-CM | POA: Diagnosis not present

## 2021-05-02 DIAGNOSIS — Z7189 Other specified counseling: Secondary | ICD-10-CM | POA: Diagnosis not present

## 2021-05-02 DIAGNOSIS — C3492 Malignant neoplasm of unspecified part of left bronchus or lung: Secondary | ICD-10-CM | POA: Diagnosis not present

## 2021-05-02 DIAGNOSIS — L03311 Cellulitis of abdominal wall: Secondary | ICD-10-CM | POA: Diagnosis not present

## 2021-05-02 LAB — COMPREHENSIVE METABOLIC PANEL
ALT: 10 U/L (ref 0–44)
AST: 17 U/L (ref 15–41)
Albumin: 2.8 g/dL — ABNORMAL LOW (ref 3.5–5.0)
Alkaline Phosphatase: 95 U/L (ref 38–126)
Anion gap: 5 (ref 5–15)
BUN: 9 mg/dL (ref 6–20)
CO2: 29 mmol/L (ref 22–32)
Calcium: 8.7 mg/dL — ABNORMAL LOW (ref 8.9–10.3)
Chloride: 109 mmol/L (ref 98–111)
Creatinine, Ser: 0.98 mg/dL (ref 0.61–1.24)
GFR, Estimated: >60 mL/min (ref >60)
Glucose, Bld: 107 mg/dL — ABNORMAL HIGH (ref 70–99)
Potassium: 3.7 mmol/L (ref 3.5–5.1)
Sodium: 143 mmol/L (ref 135–145)
Total Bilirubin: 0.5 mg/dL (ref 0.3–1.2)
Total Protein: 6 g/dL — ABNORMAL LOW (ref 6.5–8.1)

## 2021-05-02 LAB — CBC WITH DIFFERENTIAL/PLATELET
Abs Immature Granulocytes: 0.03 10*3/uL (ref 0.00–0.07)
Basophils Absolute: 0.1 10*3/uL (ref 0.0–0.1)
Basophils Relative: 1 %
Eosinophils Absolute: 0 10*3/uL (ref 0.0–0.5)
Eosinophils Relative: 0 %
HCT: 31.8 % — ABNORMAL LOW (ref 39.0–52.0)
Hemoglobin: 9.8 g/dL — ABNORMAL LOW (ref 13.0–17.0)
Immature Granulocytes: 0 %
Lymphocytes Relative: 13 %
Lymphs Abs: 1.2 10*3/uL (ref 0.7–4.0)
MCH: 28.9 pg (ref 26.0–34.0)
MCHC: 30.8 g/dL (ref 30.0–36.0)
MCV: 93.8 fL (ref 80.0–100.0)
Monocytes Absolute: 1 10*3/uL (ref 0.1–1.0)
Monocytes Relative: 11 %
Neutro Abs: 6.7 10*3/uL (ref 1.7–7.7)
Neutrophils Relative %: 75 %
Platelets: 292 10*3/uL (ref 150–400)
RBC: 3.39 MIL/uL — ABNORMAL LOW (ref 4.22–5.81)
RDW: 15 % (ref 11.5–15.5)
WBC: 9 10*3/uL (ref 4.0–10.5)
nRBC: 0 % (ref 0.0–0.2)

## 2021-05-02 LAB — MAGNESIUM: Magnesium: 2.2 mg/dL (ref 1.7–2.4)

## 2021-05-02 LAB — PHOSPHORUS: Phosphorus: 3.6 mg/dL (ref 2.5–4.6)

## 2021-05-02 MED ORDER — AMOXICILLIN-POT CLAVULANATE 875-125 MG PO TABS
1.0000 | ORAL_TABLET | Freq: Two times a day (BID) | ORAL | Status: DC
  Administered 2021-05-02 – 2021-05-03 (×3): 1 via ORAL
  Filled 2021-05-02 (×3): qty 1

## 2021-05-02 NOTE — Evaluation (Signed)
Physical Therapy Evaluation ?Patient Details ?Name: Raymond [name] ?MRN: [id number] ?DOB: 1960-08-13 ?Today's Date: 05/02/2021 ? ?History of Present Illness ? Patient is 61 y.o. male admitted for drain exchange/downsizing of drain on 04/30/21. 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 ? ?  ?Clinical Impression ? Raymond [name] is 61 y.o. male admitted with above HPI and diagnosis. Patient is currently limited by functional impairments below (see PT problem list). Patient lives with his spouse and is independent for household mobility at baseline with some assist for ADL's from wife. He was able to ambulate in room and hallway with min guard/assist; pt reaching for external support in room and balance during gait improved with use of RW for bil UE support. Patient scored a 36 on the BBS indicating he should use a RW at all times for mobility as he is a high risk for falls. Patient will benefit from continued skilled PT interventions to address impairments and progress independence with mobility, recommending OPPT for balance, strength, and endurance training. Acute PT will follow and progress as able.    ?   ? ?Recommendations for follow up therapy are one component of a multi-disciplinary discharge planning process, led by the attending physician.  Recommendations may be updated based on patient status, additional functional criteria and insurance authorization. ? ?Follow Up Recommendations Outpatient PT ? ?  ?Assistance Recommended at Discharge Intermittent Supervision/Assistance  ?Patient can return home with the following ?   ? ?  ?Equipment Recommendations None recommended by PT  ?Recommendations for Other  Services ?    ?  ?Functional Status Assessment Patient has had a recent decline in their functional status and demonstrates the ability to make significant improvements in function in a reasonable and predictable amount of time.  ? ?  ?Precautions / Restrictions Precautions ?Precautions: Fall ?Precaution Comments: 1 fall ~ 3 wks ago ?Restrictions ?Weight Bearing Restrictions: No  ? ?  ? ?Mobility ? Bed Mobility ?Overal bed mobility: Needs Assistance ?Bed Mobility: Supine to Sit ?  ?  ?Supine to sit: Modified independent (Device/Increase time), HOB elevated ?  ?  ?General bed mobility comments: extra time HOB elevated ?  ? ?Transfers ?Overall transfer level: Needs assistance ?Equipment used: None, Rolling walker (2 wheels) ?Transfers: Sit to/from Stand, Bed to chair/wheelchair/BSC ?Sit to Stand: Supervision ?  ?Step pivot transfers: Supervision, Min guard ?  ?  ?  ?General transfer comment: supervision for rise form EOB or recliner. supervision/guarding for safe transfer bed<>recliner with pt using hands to steady with turn. ?  ? ?Ambulation/Gait ?Ambulation/Gait assistance: Min guard, Min assist ?Gait Distance (Feet): 300 Feet ?Assistive device: Rolling walker (2 wheels), None ?Gait Pattern/deviations: Decreased stride length, Wide base of support, Shuffle, Step-through pattern ?Gait velocity: fair ?  ?  ?General Gait Details: guarding/min assist for safety with gait in room as pt reaching for external support of wall/furniture to steady self while ambulating to bathroom. min guard with cues for safe managment of RW and step to pattern ? ?Stairs ?  ?  ?  ?  ?  ? ?Wheelchair Mobility ?  ? ?Modified Rankin (Stroke Patients Only) ?  ? ?  ? ?Balance Overall balance assessment: Needs assistance, Mild deficits observed, not formally tested (1 fall 3wks ago) ?Sitting-balance  support: Feet supported ?Sitting balance-Leahy Scale: Good ?  ?  ?Standing balance support: No upper extremity supported, Bilateral upper extremity  supported, During functional activity ?Standing balance-Leahy Scale: Fair ?Standing balance comment: reaches for external support, much improved with bil UE support on RW ?  ?  ?  ?  ?  ?  ?  ?  ?Standardized Balance Assessment ?Standardized Balance Assessment : Berg Balance Test ?Merrilee Jansky Balance Test ?Sit to Stand: Able to stand without using hands and stabilize independently ?Standing Unsupported: Able to stand safely 2 minutes ?Sitting with Back Unsupported but Feet Supported on Floor or Stool: Able to sit safely and securely 2 minutes ?Stand to Sit: Sits safely with minimal use of hands ?Transfers: Able to transfer safely, minor use of hands ?Standing Unsupported with Eyes Closed: Able to stand 10 seconds with supervision ?Standing Ubsupported with Feet Together: Needs help to attain position but able to stand for 30 seconds with feet together ?From Standing, Reach Forward with Outstretched Arm: Can reach forward >5 cm safely (2") ?From Standing Position, Pick up Object from Floor: Able to pick up shoe, needs supervision ?From Standing Position, Turn to Look Behind Over each Shoulder: Looks behind one side only/other side shows less weight shift ?Turn 360 Degrees: Able to turn 360 degrees safely but slowly ?Standing Unsupported, Alternately Place Feet on Step/Stool: Able to complete >2 steps/needs minimal assist ?Standing Unsupported, One Foot in Front: Loses balance while stepping or standing ?Standing on One Leg: Tries to lift leg/unable to hold 3 seconds but remains standing independently ?Total Score: 36 ?  ?   ? ? ? ?Pertinent Vitals/Pain Pain Assessment ?Pain Assessment: Faces ?Faces Pain Scale: Hurts little more ?Pain Location: Lt hip ?Pain Descriptors / Indicators: Discomfort ?Pain Intervention(s): Limited activity within patient's tolerance, Monitored during session, Repositioned, Patient requesting pain meds-RN notified  ? ? ?Home Living Family/patient expects to be discharged to:: Private  residence ?Living Arrangements: Spouse/significant other ?Available Help at Discharge: Family ?Type of Home: Apartment ?Home Access: Stairs to enter ?Entrance Stairs-Rails: Right;Left ?Entrance Stairs-Number of Steps: 8 ?  ?Home Layout: One level ?Home Equipment: Kasandra Knudsen - single point;Shower seat ?Additional Comments: pt's wife is assisting him  ?  ?Prior Function Prior Level of Function : Independent/Modified Independent ?  ?  ?  ?  ?  ?  ?Mobility Comments: Pt reports using SPC PRN around the home or reaches for furniture/walls for support. reports 1 fall ~3 wks ago. ?ADLs Comments: pt reports wife is helping more with bathing/dressing ?  ? ? ?Hand Dominance  ? Dominant Hand: Left ? ?  ?Extremity/Trunk Assessment  ? Upper Extremity Assessment ?Upper Extremity Assessment: Overall WFL for tasks assessed ?  ? ?Lower Extremity Assessment ?Lower Extremity Assessment: Overall WFL for tasks assessed (5x sit<>stand test: 17.33) ?  ? ?Cervical / Trunk Assessment ?Cervical / Trunk Assessment: Normal  ?Communication  ? Communication: No difficulties  ?Cognition Arousal/Alertness: Awake/alert ?Behavior During Therapy: [facility] for tasks assessed/performed ?Overall Cognitive Status: Within Functional Limits for tasks assessed ?  ?  ?  ?  ?  ?  ?  ?  ?  ?  ?  ?  ?  ?  ?  ?  ?General Comments: pt pleasant follows commands well ?  ?  ? ?  ?General Comments   ? ?  ?Exercises    ? ?Assessment/Plan  ?  ?PT Assessment Patient needs continued PT services  ?PT Problem List Decreased strength;Decreased activity tolerance;Decreased balance;Decreased mobility;Decreased knowledge of use of  DME;Decreased safety awareness;Decreased knowledge of precautions ? ?   ?  ?PT Treatment Interventions DME instruction;Stair training;Gait training;Functional mobility training;Therapeutic activities;Therapeutic exercise;Balance training;Neuromuscular re-education;Patient/family education   ? ?PT Goals (Current goals can be found in the Care Plan section)   ?Acute Rehab PT Goals ?Patient Stated Goal: improve balance and strength ?PT Goal Formulation: With patient ?Time For Goal Achievement: 05/16/21 ?Potential to Achieve Goals: Good ? ?  ?Frequency Min 3X/week ?  ? ? ?Co-evaluation

## 2021-05-02 NOTE — Progress Notes (Signed)
? ?PROGRESS NOTE ? ?Raymond [name] KJZ:[id number] DOB: [birth year] DOA: 04/27/2021 ?PCP: Pcp, No ? 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  Recently hospitalized here in March, treated for diverticulitis of sigmoid colon with abscess, s/p  drain placement and was treated with antibiotics.  He finished his course of antibiotics about a week ago.  Seen by general surgery, GI and medical oncology.  Currently on Zosyn empirically.  IR was consulted by general surgery for consideration of down sizing of the drain.  He had successful drain exchange on 04/30/2021. ? ?05/02/2021: Patient was seen and examined at his bedside.  Reports some tenderness at the site of drain insertion.  Seen by general surgery, switching IV antibiotics to oral antibiotic, Augmentin.  Anticipated discharge date of 123. ? ?Assessment/Plan: ?Principal Problem: ?  Abdominal wall cellulitis ?Active Problems: ?  Abscess of sigmoid colon due to diverticulitis ?  Adenocarcinoma of left lung, stage 4 ([facility]) ?  Essential hypertension ?  BPH (benign prostatic hyperplasia) ?  Abdominal pain ? ?Complicated sigmoid diverticulitis with diverticular abscess, status post drain placement/abdominal cellulitis:  ?Drain exchanged by IR on 04/30/2021, downsized drain.   ?Recently treated for this with drain placement and was on  antibiotics.   ?Wound cultures with multiple organisms including E. coli, Streptococcus agalactiae, Bacteroides, Clostridium.   ?Finished antibiotics course but came again with chills, drainage, foul smell around the drain.   ?There was also concern for abdominal  cellulitis on admission and fistula formation.   ?There was also suspicion for invasion from metastatic lung cancer. ?Completed course of Zosyn, switch to Augmentin on 05/02/2021 by general surgery for DC planning.  ?Continue pain management. ?Appreciate specialist assistance. ?  ?Metastatic adenocarcinoma of left lung:  ?Follows with Dr. Julien Nordmann.   ?Status post SRS to the metastatic brain lesions as well as whole brain radiation. Currently on chemotherapeutic agents.  Oncology recommended CT chest with contrast for restaging which showed increasing size and likely number of multiple pulmonary nodules ?markedly worse when compared to December of 2022.  Also showed signs of skeletal metastatic disease  ?Management per medical oncology. ?Seen by palliative care team, ongoing goals of care discussions. ?  ?Hypertension:  ?BP is at goal 127/71.   ?Continue to hold off home oral antihypertensives. ?Continue to monitor vital signs. ?  ?BPH:  ?Continue home Flomax ?Continue to monitor urine output.  ?  ?Obesity: BMI 33 ?  ?Goals of care: Extremely poor quality of life due to metastatic lung cancer. ?Palliative care team following. ?Patient is currently full code.   ?  ?  ?  ?  ?  ?DVT prophylaxis:enoxaparin (LOVENOX) injection 40 mg Start: 05/01/21 1000 ?  ?  ?  Code Status: Full Code ?  ?Family Communication: None at the bedside ?  ?Patient status:Inpatient ?  ?Patient is from :Home ?  ?Anticipated discharge XY:IAXK likely on 05/03/2021. ?  ?Estimated DC date: Plan to discharge after general surgery signed off and if the patient remains hemodynamically stable. ?  ?Consultants: General surgery, GI, medical oncology, palliative care team. ?  ?Procedures: Drain exchange. ?  ?Antimicrobials:  ?Anti-infectives (From admission, onward)  ?  ?  ?  Start  Dose/Rate Route Frequency Ordered Stop  ?  04/28/21 2200   meropenem (MERREM) 1 g in sodium chloride 0.9 % 100 mL IVPB  Status:  Discontinued       ? 1 g ?200 mL/hr over 30  Minutes Intravenous Every 8 hours 04/28/21 0551 04/28/21 1252  ?  04/28/21 2200   piperacillin-tazobactam (ZOSYN) IVPB 3.375 g       ? 3.375 g ?12.5 mL/hr over 240 Minutes Intravenous Every 8 hours 04/28/21 1301    ?  04/28/21 1000   fluconazole (DIFLUCAN) tablet 100 mg  Status:  Discontinued       ? 100 mg Oral Daily 04/28/21 0524 04/28/21 0914  ?  04/28/21 0045   ertapenem (INVANZ) 1,000 mg in sodium chloride 0.9 % 100 mL IVPB       ? 1 g ?200 mL/hr over 30 Minutes Intravenous  Once 04/28/21 0035 04/28/21 0155  ?  ?   ?  ?  ? ? ?Objective: ?Vitals:  ? 05/01/21 0459 05/01/21 1347 05/01/21 2048 05/02/21 2409  ?BP:  136/71 131/69 138/75  ?Pulse: 77 76 81 88  ?Resp:   17 16  ?Temp:  98.4 ?F (36.9 ?C) 98.4 ?F (36.9 ?C) 98.3 ?F (36.8 ?C)  ?TempSrc:  Oral Oral Oral  ?SpO2: 92% 97% 97% 95%  ?Weight:      ?Height:      ? ? ?Intake/Output Summary (Last 24 hours) at 05/02/2021 1443 ?Last data filed at 05/02/2021 1000 ?Gross per 24 hour  ?Intake 456.58 ml  ?Output 1100 ml  ?Net -643.42 ml  ? ?Filed Weights  ? 04/27/21 2158  ?Weight: 108 kg  ? ? ?Exam: ? ?General: 61 y.o. year-old male well-developed well-nourished in no acute distress.  He is alert and oriented x3.   ?Cardiovascular: Regular rate and rhythm no rubs or gallops. ?Respiratory: Clear to auscultation no wheezes or rales.   ?Abdomen: Mildly tender at the site of insertion of the drain.  Bowel sounds present. ?Musculoskeletal: No lower extremity edema bilaterally.   ?Skin: No ulcerative lesions. ?Psychiatry: Mood is appropriate for condition and setting. ? ? ?Data Reviewed: ?CBC: ?Recent Labs  ?Lab 04/27/21 ?1313 04/27/21 ?2215 04/28/21 ?0500 04/30/21 ?7353 05/02/21 ?2992  ?WBC 9.1 11.8* 10.6* 10.1 9.0  ?NEUTROABS 6.9 9.0* 8.2*  --  6.7  ?HGB 10.8* 11.7* 10.5* 10.1* 9.8*  ?HCT 34.2* 37.3* 33.9* 31.7* 31.8*  ?MCV 90.5 92.6 94.4 93.0 93.8  ?PLT 244 270 246 261 292  ? ?Basic Metabolic Panel: ?Recent Labs  ?Lab 04/27/21 ?1313 04/27/21 ?2215 04/28/21 ?0500 04/30/21 ?4268  05/02/21 ?3419  ?NA 141 139 139 140 143  ?K 4.0 3.8 4.1 3.9 3.7  ?CL 104 105 108 107 109  ?CO2 27 26 25 29 29   ?GLUCOSE 105* 100* 92 99 107*  ?BUN 11 12 11 8 9   ?CREATININE 0.84 0.96 0.80 0.83 0.98  ?CALCIUM 9.1 9.4 8.9 8.8* 8.7*  ?MG  --   --  1.9  --  2.2  ?PHOS  --   --   --   --  3.6  ? ?GFR: ?Estimated Creatinine Clearance: 102.7 mL/min (by C-G formula based on SCr of 0.98 mg/dL). ?Liver Function Tests: ?Recent Labs  ?Lab 04/27/21 ?1313 04/27/21 ?2215 04/28/21 ?0500 05/02/21 ?0644  ?AST 17 21 18 17   ?ALT 9 14 11 10   ?ALKPHOS 113 113 101 95  ?BILITOT 0.4 0.8 0.8 0.5  ?PROT 6.6 7.2 6.1* 6.0*  ?ALBUMIN 3.6 3.5 3.2* 2.8*  ? ?No results  for input(s): LIPASE, AMYLASE in the last 168 hours. ?No results for input(s): AMMONIA in the last 168 hours. ?Coagulation Profile: ?No results for input(s): INR, PROTIME in the last 168 hours. ?Cardiac Enzymes: ?No results for input(s): CKTOTAL, CKMB, CKMBINDEX, TROPONINI in the last 168 hours. ?BNP (last 3 results) ?No results for input(s): PROBNP in the last 8760 hours. ?HbA1C: ?No results for input(s): HGBA1C in the last 72 hours. ?CBG: ?Recent Labs  ?Lab 04/28/21 ?1223 04/28/21 ?1752  ?GLUCAP 81 113*  ? ?Lipid Profile: ?No results for input(s): CHOL, HDL, LDLCALC, TRIG, CHOLHDL, LDLDIRECT in the last 72 hours. ?Thyroid Function Tests: ?No results for input(s): TSH, T4TOTAL, FREET4, T3FREE, THYROIDAB in the last 72 hours. ?Anemia Panel: ?No results for input(s): VITAMINB12, FOLATE, FERRITIN, TIBC, IRON, RETICCTPCT in the last 72 hours. ?Urine analysis: ?   ?Component Value Date/Time  ? COLORURINE AMBER (A) 04/28/2021 0000  ? APPEARANCEUR CLEAR 04/28/2021 0000  ? LABSPEC >1.046 (H) 04/28/2021 0000  ? PHURINE 5.0 04/28/2021 0000  ? GLUCOSEU NEGATIVE 04/28/2021 0000  ? HGBUR NEGATIVE 04/28/2021 0000  ? HGBUR moderate 10/17/2007 1442  ? [place] NEGATIVE 04/28/2021 0000  ? [place] NEGATIVE 04/28/2021 0000  ? PROTEINUR NEGATIVE 04/28/2021 0000  ? UROBILINOGEN 1.0 07/17/2013 1048   ? NITRITE NEGATIVE 04/28/2021 0000  ? LEUKOCYTESUR NEGATIVE 04/28/2021 0000  ? ?Sepsis Labs: ?@[email or web address] (procalcitonin:4,lacticidven:4) ? ?) ?Recent Results (from the past 240 hour(s))  ?Culture, blood ([place]

## 2021-05-02 NOTE — Progress Notes (Addendum)
? ?Palliative Medicine Inpatient Follow Up Note ? ? ? ? ?Chart Reviewed. Patient assessed at the bedside.  ? ?Raymond [name] is sitting on the side of the bed. Ambulatory around his room with no assistive devices. Reports his pain has improved since revision of drainage tube. Is hopeful to go home tomorrow. Denies nausea or vomiting. Appetite has improved.  ? ?We discussed goals of care. Raymond [name] is clear in his expressed wishes to continue to treat. He understands his long-term prognosis is poor however speaks on his strong faith and reliance on God.  ? ?When discussing his overall health and challenges he becomes emotional expressing "taking one step forward and 2 steps backwards!" Emotional support provided. Given extensiveness of his cancer he understands the concern that he will continue to have health challenges and his his health will continue to decline to point of facing end-of-life. He again confirms his understanding.  ? ?He does have a documented advanced directive naming his wife as medical decision maker, no artificial feedings, and no life prolonging measures in the setting of advanced dementia or conditions resulting in loss of his ability to think. Wishes are to remain a full code at this time allowing him every opportunity to continue to thrive. I strongly encouraged Raymond [name] to continue goals of care discussions with family and consider wishes to be prepared for further health decline requiring complicated medical decisions.  ? ?He is appreciative of our visit. Understands I will continue to support him outpatient at [facility] for goals of care and symptom management support.  ? ? ?Discussed the importance of continued conversation with family and their  medical providers regarding overall plan of care and treatment options, ensuring decisions are within the context of the patients values and GOCs. ? ? ?Questions addressed and support provided.   ? ?Objective Assessment: ?Vital  Signs ?Vitals:  ? 05/01/21 2048 05/02/21 0605  ?BP: 131/69 138/75  ?Pulse: 81 88  ?Resp: 17 16  ?Temp: 98.4 ?F (36.9 ?C) 98.3 ?F (36.8 ?C)  ?SpO2: 97% 95%  ? ? ?Intake/Output Summary (Last 24 hours) at 05/02/2021 1047 ?Last data filed at 05/02/2021 1000 ?Gross per 24 hour  ?Intake 456.58 ml  ?Output 1100 ml  ?Net -643.42 ml  ? ?Last Weight  Most recent update: 04/27/2021  9:59 PM  ? ? Weight  ?108 kg (238 lb)  ?      ? ?  ? ?Gen:  NAD ?HEENT: moist mucous membranes ?CV: Regular rate and rhythm, no murmurs rubs or gallops ?PULM: clear to auscultation bilaterally. No wheezes/rales/rhonchi ?ABD: soft/tender around drain site/nondistended/normal bowel sounds, perc drain in place, dressing intact, minimal drainage in bag  ?Neuro: Alert and oriented x3, mood appropriate  ? ?SUMMARY OF RECOMMENDATIONS   ?Full Code as confirmed by patient  ?Continue with current plan of care per medical team  ?Raymond [name] is clear in expressed wishes to continue to treat. Relying on [place]. Realistic in his understanding of poor long-term prognosis but again is remaining hopeful.  ?Strongly recommend ongoing goals of care discussions. I will continue to follow at [facility] for outpatient palliative needs.  ?PMT will continue to support and follow as needed basis. Please secure chat for urgent needs.  ? ? ?Time Total: 45 min.  ? ?Visit consisted of counseling and education dealing with the complex and emotionally intense issues of symptom management and palliative care in the setting of serious and potentially life-threatening illness.Greater than 50%  of this  time was spent counseling and coordinating care related to the above assessment and plan. ? ?Raymond [name], AGPCNP-BC  ?Palliative Medicine Team ?[phone] ? ?Palliative Medicine Team providers are available by phone from 7am to 7pm daily and can be reached through the team cell phone. Should this patient require assistance outside of these hours, please  call the patient's attending physician.  ?

## 2021-05-02 NOTE — Progress Notes (Signed)
? ?Subjective/Chief Complaint: ?Patient feels much better since drain repositioned/ downsized. ?Pain is improved, less generalized, mostly around the catheter insertion site ?The drain output is less purulent - more serosanguinous ?Much flatus, no BM yesterday ? ? ?Objective: ?Vital signs in last 24 hours: ?Temp:  [98.3 ?F (36.8 ?C)-98.4 ?F (36.9 ?C)] 98.3 ?F (36.8 ?C) (04/30 1751) ?Pulse Rate:  [76-88] 88 (04/30 0605) ?Resp:  [16-17] 16 (04/30 0258) ?BP: (131-138)/(69-75) 138/75 (04/30 5277) ?SpO2:  [95 %-97 %] 95 % (04/30 0605) ?Last BM Date : 04/30/21 ? ?Intake/Output from previous day: ?04/29 0701 - 04/30 0700 ?In: 240 [P.O.:240] ?Out: 800 [Urine:800] ?Intake/Output this shift: ?No intake/output data recorded. ? ?WDWN in NAD ?Abd - soft, minimal LLQ tenderness around the drain ?No peritoneal signs ?Drain - serosanguinous output in bag ? ?Lab Results:  ? ?Today's labs are pending. ?Recent Labs  ?  04/30/21 ?0733  ?WBC 10.1  ?HGB 10.1*  ?HCT 31.7*  ?PLT 261  ? ?BMET ?Recent Labs  ?  04/30/21 ?0733  ?NA 140  ?K 3.9  ?CL 107  ?CO2 29  ?GLUCOSE 99  ?BUN 8  ?CREATININE 0.83  ?CALCIUM 8.8*  ? ?PT/INR ?No results for input(s): LABPROT, INR in the last 72 hours. ?ABG ?No results for input(s): PHART, HCO3 in the last 72 hours. ? ?Invalid input(s): PCO2, PO2 ? ?Studies/Results: ?IR Catheter Tube Change ? ?Result Date: 04/30/2021 ?INDICATION: 61 year old gentleman with left lower quadrant diverticular abscess drain and chronic fistula presents to IR for downsizing of drain. Drain initially placed on 03/04/2021. EXAM: Fluoroscopic drain exchange MEDICATIONS: None ANESTHESIA/SEDATION: None COMPLICATIONS: None immediate. PROCEDURE: Informed written consent was obtained from the patient after a thorough discussion of the procedural risks, benefits and alternatives. All questions were addressed. Maximal Sterile Barrier Technique was utilized including caps, mask, sterile gowns, sterile gloves, sterile drape, hand hygiene and  skin antiseptic. A timeout was performed prior to the initiation of the procedure. Patient positioned supine on the procedure table. The external segment of the left lower quadrant drain and surrounding skin prepped and draped usual fashion. Contrast administered through the drain under fluoroscopy again showed fistulous communication with the colon. The existing 12.0 French drain was cut and removed over 0.035 inch guidewire. New 10.2 French multipurpose pigtail drain was inserted over a guidewire and positioned within the left lower quadrant. Final position confirmed with fluoroscopy. Drain flushed and attached to bag. Drain secured to skin with StatLock and covered with sterile dressing. IMPRESSION: Successful downsizing of left lower quadrant abscess drain from 12.0 [place] to 10.2 [place] given chronic fistulous communication. PLAN: Please contact IR when patient is ready for discharge so that follow-up appointment can be established for clinic. Electronically Signed   By: Miachel Roux M.D.   On: 04/30/2021 16:43   ? ?Anti-infectives: ?Anti-infectives (From admission, onward)  ? ? Start     Dose/Rate Route Frequency Ordered Stop  ? 05/02/21 1000  amoxicillin-clavulanate (AUGMENTIN) 875-125 MG per tablet 1 tablet       ? 1 tablet Oral Every 12 hours 05/02/21 0750    ? 04/28/21 2200  meropenem (MERREM) 1 g in sodium chloride 0.9 % 100 mL IVPB  Status:  Discontinued       ? 1 g ?200 mL/hr over 30 Minutes Intravenous Every 8 hours 04/28/21 0551 04/28/21 1252  ? 04/28/21 2200  piperacillin-tazobactam (ZOSYN) IVPB 3.375 g  Status:  Discontinued       ? 3.375 g ?12.5 mL/hr over 240 Minutes Intravenous Every 8  hours 04/28/21 1301 05/02/21 0750  ? 04/28/21 1000  fluconazole (DIFLUCAN) tablet 100 mg  Status:  Discontinued       ? 100 mg Oral Daily 04/28/21 0524 04/28/21 0914  ? 04/28/21 0045  ertapenem (INVANZ) 1,000 mg in sodium chloride 0.9 % 100 mL IVPB       ? 1 g ?200 mL/hr over 30 Minutes Intravenous  Once 04/28/21  0035 04/28/21 0155  ? ?  ? ? ?Assessment/Plan: ?Complicated Sigmoid Diverticulitis  ?- S/p IR drain 3/2. Exchanged 3/17. 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 Exchanged 04/30/21. Decreased output ?- CT 4/25 w/ IR drain in the LLQ adjacent to proximal sigmoid colon without residual fluid collection. No obvious inflammation of the colon on CT but worsening symptoms and readdmitted on IV abx.  ?- As stated by Dr. Barry Dienes - This is a very difficult situation given that he may potentially have a lung cancer metastasis causing this perforation.  The perforation is now a controlled fistula via the drain.  This is unlikely to heal given his overall health issues.  ?- Probably ready for discharge tomorrow.  Will convert Zosyn to PO Augmentin. ?- Daily Miralax ?  ?  ?ID - Invanz/Fluconazole 4/26. Zosyn 4/26 >> Afebrile. WBC normalized.  ?VTE - SCDs, Lovenox ?FEN - IVF, FLD, ADAT  ?Foley - none ?  ?Metastatic lung cancer on chemo, whole brain radiation ?Obesity BMI 35.43 ?HTN ?GERD ?OSA ?MI in 2006 ? LOS: 4 days  ? ? ?Raymond [name] Raymond [name] ?05/02/2021 ? ?

## 2021-05-03 ENCOUNTER — Encounter: Payer: Self-pay | Admitting: Internal Medicine

## 2021-05-03 ENCOUNTER — Telehealth: Payer: Self-pay | Admitting: Medical Oncology

## 2021-05-03 ENCOUNTER — Other Ambulatory Visit (HOSPITAL_COMMUNITY): Payer: Self-pay

## 2021-05-03 DIAGNOSIS — L03311 Cellulitis of abdominal wall: Secondary | ICD-10-CM | POA: Diagnosis not present

## 2021-05-03 LAB — CULTURE, BLOOD (ROUTINE X 2)
Culture: NO GROWTH
Culture: NO GROWTH
Special Requests: ADEQUATE
Special Requests: ADEQUATE

## 2021-05-03 MED ORDER — AMOXICILLIN-POT CLAVULANATE 875-125 MG PO TABS: 1.0000 | ORAL_TABLET | Freq: Two times a day (BID) | ORAL | 0 refills | Status: DC

## 2021-05-03 MED ORDER — AMOXICILLIN-POT CLAVULANATE 875-125 MG PO TABS
1.0000 | ORAL_TABLET | Freq: Two times a day (BID) | ORAL | 0 refills | Status: DC
Start: 2021-05-03 — End: 2021-05-17
  Filled 2021-05-03: qty 16, 8d supply, fill #0

## 2021-05-03 MED ORDER — FOLIC ACID 1 MG PO TABS: 1.0000 mg | ORAL_TABLET | Freq: Every day | ORAL | 0 refills | Status: DC

## 2021-05-03 NOTE — Telephone Encounter (Signed)
Loan deferment forms.  ? ?

## 2021-05-03 NOTE — Progress Notes (Signed)
? ? ?   ?Subjective: ?CC: ?Feeling better. Still some LLQ pain, mainly around the drain. Tolerating solid diet without n/v. Passing flatus. Last BM Saturday. WBC 9 yesterday. AF overnight.  ? ?Objective: ?Vital signs in last 24 hours: ?Temp:  [97.9 ?F (36.6 ?C)-98.2 ?F (36.8 ?C)] 98.2 ?F (36.8 ?C) (05/01 0429) ?Pulse Rate:  [79-82] 79 (05/01 0429) ?Resp:  [17-18] 18 (05/01 0429) ?BP: (142-147)/(82-96) 142/82 (05/01 0429) ?SpO2:  [92 %-96 %] 96 % (05/01 0429) ?Last BM Date : 04/30/21 ? ?Intake/Output from previous day: ?04/30 0701 - 05/01 0700 ?In: 1896.6 [P.O.:1440; IV Piggyback:456.6] ?Out: 3875 [Urine:1450; Drains:10] ?Intake/Output this shift: ?No intake/output data recorded. ? ?PE: ?Gen:  Alert, NAD, pleasant ?Pulm: rate and effort normal ?Abd: Soft, ND, NT on today's exam. +BS, LLQ IR drain SS ?Psych: A&Ox3  ?Skin: no rashes noted, warm and dry ? ?Lab Results:  ?Recent Labs  ?  05/02/21 ?6433  ?WBC 9.0  ?HGB 9.8*  ?HCT 31.8*  ?PLT 292  ? ?BMET ?Recent Labs  ?  05/02/21 ?2951  ?NA 143  ?K 3.7  ?CL 109  ?CO2 29  ?GLUCOSE 107*  ?BUN 9  ?CREATININE 0.98  ?CALCIUM 8.7*  ? ?PT/INR ?No results for input(s): LABPROT, INR in the last 72 hours. ?CMP  ?   ?Component Value Date/Time  ? NA 143 05/02/2021 0644  ? K 3.7 05/02/2021 0644  ? CL 109 05/02/2021 0644  ? CO2 29 05/02/2021 0644  ? GLUCOSE 107 (H) 05/02/2021 0644  ? BUN 9 05/02/2021 0644  ? CREATININE 0.98 05/02/2021 0644  ? CREATININE 0.84 04/27/2021 1313  ? CALCIUM 8.7 (L) 05/02/2021 0644  ? PROT 6.0 (L) 05/02/2021 8841  ? ALBUMIN 2.8 (L) 05/02/2021 6606  ? ALBUMIN 4.0 05/20/2019 1951  ? AST 17 05/02/2021 0644  ? AST 17 04/27/2021 1313  ? ALT 10 05/02/2021 0644  ? ALT 9 04/27/2021 1313  ? ALKPHOS 95 05/02/2021 0644  ? BILITOT 0.5 05/02/2021 0644  ? BILITOT 0.4 04/27/2021 1313  ? GFRNONAA >60 05/02/2021 0644  ? GFRNONAA >60 04/27/2021 1313  ? GFRAA >60 05/25/2019 0427  ? ?Lipase  ?   ?Component Value Date/Time  ? LIPASE 21 03/03/2021 0958  ? ? ?Studies/Results: ?No  results found. ? ?Anti-infectives: ?Anti-infectives (From admission, onward)  ? ? Start     Dose/Rate Route Frequency Ordered Stop  ? 05/02/21 1000  amoxicillin-clavulanate (AUGMENTIN) 875-125 MG per tablet 1 tablet       ? 1 tablet Oral Every 12 hours 05/02/21 0750    ? 04/28/21 2200  meropenem (MERREM) 1 g in sodium chloride 0.9 % 100 mL IVPB  Status:  Discontinued       ? 1 g ?200 mL/hr over 30 Minutes Intravenous Every 8 hours 04/28/21 0551 04/28/21 1252  ? 04/28/21 2200  piperacillin-tazobactam (ZOSYN) IVPB 3.375 g  Status:  Discontinued       ? 3.375 g ?12.5 mL/hr over 240 Minutes Intravenous Every 8 hours 04/28/21 1301 05/02/21 0750  ? 04/28/21 1000  fluconazole (DIFLUCAN) tablet 100 mg  Status:  Discontinued       ? 100 mg Oral Daily 04/28/21 0524 04/28/21 0914  ? 04/28/21 0045  ertapenem (INVANZ) 1,000 mg in sodium chloride 0.9 % 100 mL IVPB       ? 1 g ?200 mL/hr over 30 Minutes Intravenous  Once 04/28/21 0035 04/28/21 0155  ? ?  ? ? ? ?Assessment/Plan ?Complicated Sigmoid Diverticulitis  ?- S/p IR drain 3/2. Exchanged  3/17. 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 Exchanged 04/30/21. Decreased output ?- CT 4/25 w/ IR drain in the LLQ adjacent to proximal sigmoid colon without residual fluid collection. No obvious inflammation of the colon on CT but worsening symptoms and readdmitted on IV abx.  ?- As stated by Dr. Barry Dienes - This is a very difficult situation given that he may potentially have a lung cancer metastasis causing this perforation.  The perforation is now a controlled fistula via the drain.  This is unlikely to heal given his overall health issues.  ?- IR drain downsized 4/28. Symptomatically improved since then - less pain, NT on exam today, tolerating diet. Afebrile. WBC wnl. Drain SS. Okay for d/c on Augmentin from our standpoint (14 days total abx). Arranged f/u with Dr. Marlou Starks in the office, appears this is who he was originally supposed to f/u  with. I have also alerted IR he will likely discharge today. Will reach out to [facility] w/ recs.  ?  ?ID - Invanz/Fluconazole 4/26. Zosyn 4/26 - 4/30. Augmentin 4/30 >> ?VTE - SCDs, Lovenox ?FEN - Reg ?Foley - none ?  ?Metastatic lung cancer on chemo, whole brain radiation ?Obesity BMI 35.43 ?HTN ?GERD ?OSA ?MI in 2006 ? ? LOS: 5 days  ? ? ?Jillyn Ledger , PA-C ?[facility] ?05/03/2021, 8:38 AM ?Please see Amion for pager number during day hours 7:00am-4:30pm ? ?

## 2021-05-03 NOTE — TOC Transition Note (Signed)
Transition of Care (TOC) - CM/SW Discharge Note ? ? ?Patient Details  ?Name: Raymond [name] ?MRN: [id number] ?Date of Birth: Mar 13, 1960 ? ?Transition of Care (TOC) CM/SW Contact:  ?Joseph Johns, Marjie Skiff, RN ?Phone Number: ?05/03/2021, 10:49 AM ? ? ?Clinical Narrative:    ?Spoke with patient about PT/OT recommendations for home health services. Pt politely declines home health. Pt states that he was already managing the fistula drain at home on his own prior to admission and does not need assistance with this. Pt encouraged to touch base with staff RN prior to dc to make sure he knew how often to empty his drain and if he needed to continue to flush it. He states he will do this. ? ?Pt also asked if I would contact the [place] at the [facility] as he needs to talk to her about some paperwork they have been working on. Manuela Schwartz sent chat to follow up with pt.  ? ? ?Readmission Risk Interventions ? ?  05/01/2021  ?  5:25 PM 03/04/2021  ? 11:02 AM  ?Readmission Risk Prevention Plan  ?Transportation Screening Complete Complete  ?PCP or Specialist Appt within 3-5 Days  Complete  ?[place] or Home Care Consult  Complete  ?Social Work Consult for [place] Planning/Counseling  Complete  ?Palliative Care Screening  Not Applicable  ?Medication Review Press photographer) Complete Complete  ?SW Recovery Care/Counseling Consult Complete   ?Palliative Care Screening Complete   ?[place] Not Applicable   ? ? ? ? ? ?

## 2021-05-03 NOTE — Discharge Summary (Signed)
?Discharge Summary ? ?Raymond [name] MVE:[id number] DOB: 1960/12/31 ? ?PCP: Pcp, No ? ?Admit date: 04/27/2021 ?Discharge date: 05/03/2021 ? ?Time spent: 35 minutes. ? ?Recommendations for Outpatient Follow-up:  ?Follow-up with your medical oncologist ?Follow-up with your primary care provider ?Take your medications as prescribed. ?Continue fall precautions. ? ?Discharge Diagnoses:  ?Active Hospital Problems  ? Diagnosis Date Noted  ? Abdominal wall cellulitis 04/28/2021  ?  Priority: 1.  ? Abscess of sigmoid colon due to diverticulitis 03/03/2021  ?  Priority: 2.  ? Adenocarcinoma of left lung, stage 4 ([place]) 10/05/2020  ?  Priority: 3.  ? Essential hypertension 11/02/2020  ?  Priority: 4.  ? BPH (benign prostatic hyperplasia) 04/28/2021  ?  Priority: 5.  ? Abdominal pain 04/28/2021  ?  ?Resolved Hospital Problems  ?No resolved problems to display.  ? ? ?Discharge Condition: Stable. ? ?Diet recommendation: Resume previous diet. ? ?Vitals:  ? 05/02/21 2001 05/03/21 0429  ?BP: (!) 145/90 (!) 142/82  ?Pulse: 81 79  ?Resp: 17 18  ?Temp: 97.9 ?F (36.6 ?C) 98.2 ?F (36.8 ?C)  ?SpO2: 92% 96%  ? ? 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  Recently hospitalized here in March, treated for diverticulitis of sigmoid colon with abscess, s/p drain placement and was treated with antibiotics.  He finished his course of antibiotics about a week ago.  Seen by general surgery, GI and medical oncology.  Currently on Zosyn empirically.  IR was consulted by general surgery for exchange and down sizing of the drain.  He had successful drain exchange on  04/30/2021. ?  ?05/03/2021: Patient was seen and examined at his bedside.  There were no acute events overnight.  He has no new complaints and is eager to go home. ? ?Hospital Course:  ?Principal Problem: ?  Abdominal wall cellulitis ?Active Problems: ?  Abscess of sigmoid colon due to diverticulitis ?  Adenocarcinoma of left lung, stage 4 ([place]) ?  Essential hypertension ?  BPH (benign prostatic hyperplasia) ?  Abdominal pain ? ?Complicated sigmoid diverticulitis with diverticular abscess, status post drain placement/abdominal cellulitis, POA:  ?Drain exchanged by IR on 04/30/2021, downsized drain.  ?Completed course of Zosyn, switch to Augmentin on 05/02/2021 by general surgery for DC planning.  ?Follow-up with general surgery, follow-up with IR. ?  ?Metastatic adenocarcinoma of left lung:  ?Follows with Dr. Julien Nordmann.   ?Status post SRS to the metastatic brain lesions as well as whole brain radiation. Currently on chemotherapeutic agents.  Oncology recommended CT chest with contrast for restaging which showed increasing size and likely number of multiple pulmonary nodules markedly worse when compared to December of 2022.  Also showed signs of skeletal metastatic disease  ?Follow-up with medical oncology. ?  ?Hypertension:  ?BP is at goal 127/71.   ?Resume home regimen. ?  ?BPH:  ?Continue home Flomax ?  ?Obesity: BMI 33 ?  ? ? ?Procedures: ?Drain exchange. ? ?Consultations: ?General surgery ?IR. ?Palliative care ? ?Discharge Exam: ?BP (!) 142/82 (BP Location: Left Arm)   Pulse 79   Temp 98.2 ?F (36.8 ?C) (Oral)   Resp 18   Ht 5\' 11"  (1.803 m)   Wt 108 kg   SpO2 96%  BMI 33.19 kg/m?  ?General: 60 y.o. year-old male well developed well nourished in no acute distress.  Alert and oriented x3. ?Cardiovascular: Regular rate and rhythm with no rubs or gallops.  No thyromegaly or JVD noted.   ?Respiratory: Clear to auscultation with no wheezes or rales. Good inspiratory effort. ?Abdomen: Soft nontender nondistended with  normal bowel sounds x4 quadrants. ?Musculoskeletal: No lower extremity edema. 2/4 pulses in all 4 extremities. ?Skin: No ulcerative lesions noted or rashes, ?Psychiatry: Mood is appropriate for condition and setting ? ?Discharge Instructions ?You were cared for by a hospitalist during your hospital stay. If you have any questions about your discharge medications or the care you received while you were in the hospital after you are discharged, you can call the unit and asked to speak with the hospitalist on call if the hospitalist that took care of you is not available. Once you are discharged, your primary care physician will handle any further medical issues. Please note that NO REFILLS for any discharge medications will be authorized once you are discharged, as it is imperative that you return to your primary care physician (or establish a relationship with a primary care physician if you do not have one) for your aftercare needs so that they can reassess your need for medications and monitor your lab values. ? ? ?Allergies as of 05/03/2021   ? ?   Reactions  ? Benadryl [diphenhydramine] Itching  ? 01/24/21 pt states this was a one time reaction and that he takes now when needed  ? ?  ? ?  ?Medication List  ?  ? ?STOP taking these medications   ? ?fluconazole 100 MG tablet ?Commonly known as: DIFLUCAN ?  ?oxyCODONE-acetaminophen 5-325 MG tablet ?Commonly known as: PERCOCET/ROXICET ?  ?tamsulosin 0.4 MG Caps capsule ?Commonly known as: Flomax ?  ? ?  ? ?TAKE these medications   ? ?acetaminophen 650 MG CR tablet ?Commonly known as: TYLENOL ?Take 650 mg by mouth every 8 (eight) hours as needed for pain. ?  ?amLODipine 10 MG tablet ?Commonly known as: NORVASC ?Take 1 tablet by mouth daily. ?  ?amoxicillin-clavulanate 875-125 MG tablet ?Commonly known as: AUGMENTIN ?Take 1 tablet by mouth every 12 (twelve) hours for 8 days. ?What changed: when to take this ?  ?Benefiber Drink Mix Pack ?Take 1 Package by mouth daily at 6  (six) AM. ?  ?carbamide peroxide 6.5 % OTIC solution ?Commonly known as: DEBROX ?Place 5 drops into both ears 3 (three) times a week. ?  ?dexamethasone 4 MG tablet ?Commonly known as: DECADRON ?Please take 2 tablets 2 times a day the day before, the day of, and the day after treatment. ?  ?Ensure Plus Liqd ?Take 237 mLs by mouth 3 (three) times daily between meals. Chocolate ?  ?fluticasone 50 MCG/ACT nasal spray ?Commonly known as: FLONASE ?Place 1 spray into both nostrils daily. ?  ?folic acid 1 MG tablet ?Commonly known as: FOLVITE ?Take 1 tablet (1 mg total) by mouth daily. ?  ?gabapentin 300 MG capsule ?Commonly known as: Neurontin ?Take 2 capsules (600 mg total) by mouth at bedtime. ?  ?guaiFENesin 600 MG 12 hr tablet ?Commonly known as: [place] ?Take 600 mg by mouth 2 (two) times daily. ?  ?memantine 10 MG tablet ?Commonly known as: Namenda ?Take 1 tablet (10 mg total) by mouth 2 (two) times daily. ?  ?mirtazapine 30 MG tablet ?Commonly known as: Remeron ?Take 1 tablet (30 mg total) by mouth at bedtime. ?  ?multivitamin  with minerals Tabs tablet ?Take 1 tablet by mouth daily. ?  ?ondansetron 8 MG tablet ?Commonly known as: ZOFRAN ?Take 1 tablet (8 mg total) by mouth every 8 (eight) hours as needed for nausea or vomiting. ?  ?oxyCODONE 5 MG immediate release tablet ?Commonly known as: Oxy IR/ROXICODONE ?Take 1-2 tablets (5-10 mg total) by mouth every 6 (six) hours as needed for severe pain. ?  ?prochlorperazine 10 MG tablet ?Commonly known as: COMPAZINE ?Take 1 tablet (10 mg total) by mouth every 6 (six) hours as needed for nausea or vomiting. ?  ?senna-docusate 8.6-50 MG tablet ?Commonly known as: Senna S ?Take 2 tablets by mouth daily. ?  ?vitamin B-12 1000 MCG tablet ?Commonly known as: CYANOCOBALAMIN ?Take 1,000 mcg by mouth daily. ?  ? ?  ? ?Allergies  ?Allergen Reactions  ? Benadryl [Diphenhydramine] Itching  ?  01/24/21 pt states this was a one time reaction and that he takes now when needed  ? ?  Follow-up Information   ? ? Autumn Messing III, MD Follow up on 05/24/2021.   ?Specialty: General Surgery ?Why: 950am. Please arrive 30 minutes prior to your appointment for paperwork. Please bring a copy of your photo ID and i

## 2021-05-03 NOTE — Telephone Encounter (Signed)
Loan deferment form given to  Dr Julien Nordmann  ?

## 2021-05-03 NOTE — Evaluation (Signed)
Occupational Therapy Evaluation ?Patient Details ?Name: Raymond [name] ?MRN: [id number] ?DOB: 02-23-1960 ?Today's Date: 05/03/2021 ? ? ?History of Present Illness Patient is 61 y.o. male admitted for drain exchange/downsizing of drain on 04/30/21. 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  ? ?Clinical Impression ?  ?Patient is a 61 year old male who was admitted for above. Patient was noted to be supervision for ADLs with safety cues with RW. Patient reported feeling better today but continuing to have some discomfort in L side near drain. Patient plans to d/c home with [facility] services and wife support on this date. Patient would continue to benefit from skilled OT services at this time while admitted and after d/c to address noted deficits in order to improve overall safety and independence in ADLs.  ?  ?   ? ?Recommendations for follow up therapy are one component of a multi-disciplinary discharge planning process, led by the attending physician.  Recommendations may be updated based on patient status, additional functional criteria and insurance authorization.  ? ?Follow Up Recommendations ? Home health OT  ?  ?Assistance Recommended at Discharge Frequent or constant Supervision/Assistance  ?Patient can return home with the following A little help with walking and/or transfers;A little help with bathing/dressing/bathroom;Assistance with cooking/housework;Direct supervision/assist for financial management;Assist for transportation;Direct supervision/assist for medications management;Help with stairs or ramp for entrance ? ?  ?Functional Status Assessment ? Patient has had a recent decline in their functional status and  demonstrates the ability to make significant improvements in function in a reasonable and predictable amount of time.  ?Equipment Recommendations ? None recommended by OT  ?  ?Recommendations for Other Services   ? ? ?  ?Precautions / Restrictions Precautions ?Precautions: Fall ?Precaution Comments: L side drain ?Restrictions ?Weight Bearing Restrictions: No  ? ?  ? ?Mobility Bed Mobility ?  ?  ?  ?  ?  ?  ?  ?General bed mobility comments: patient was sititng EOB at start of session. ?  ? ?Transfers ?  ?  ?  ?  ?  ?  ?  ?  ?  ?  ?  ? ?  ?Balance Overall balance assessment: Mild deficits observed, not formally tested ?  ?  ?  ?  ?  ?  ?  ?  ?  ?  ?  ?  ?  ?  ?  ?  ?  ?  ?   ? ?ADL either performed or assessed with clinical judgement  ? ?ADL Overall ADL's : At baseline ?Eating/Feeding: Modified independent;Sitting ?  ?Grooming: Dance movement psychotherapist;Wash/dry hands;Supervision/safety;Standing ?Grooming Details (indicate cue type and reason): at sink ?Upper Body Bathing: Standing;Supervision/ safety ?Upper Body Bathing Details (indicate cue type and reason): at sink ?Lower Body Bathing: Supervison/ safety;Sit to/from stand;Sitting/lateral leans ?Lower Body Bathing Details (indicate cue type and reason): at sink ?Upper Body Dressing : Supervision/safety;Sitting ?  ?Lower Body Dressing: Supervision/safety;Sit to/from stand;Sitting/lateral leans ?Lower Body Dressing Details (indicate cue type and reason): simulated and don/doff socks sitting in recliner with move movement in BLE noted this admission ?Toilet Transfer: Supervision/safety;Ambulation;Rolling walker (2 wheels) ?  ?Toileting- Clothing Manipulation and Hygiene: Supervision/safety;Sit to/from stand ?  ?  ?  ?  ?   ? ? ? ?Vision Patient Visual Report:  No change from baseline ?   ?   ?Perception   ?  ?Praxis   ?  ? ?Pertinent Vitals/Pain Pain Assessment ?Pain Assessment: 0-10 ?Pain Score: 7  ?Pain Location: drain sight ?Pain Descriptors / Indicators: Discomfort ?Pain  Intervention(s): Limited activity within patient's tolerance, Monitored during session, Repositioned  ? ? ? ?Hand Dominance Left ?  ?Extremity/Trunk Assessment Upper Extremity Assessment ?Upper Extremity Assessment: Overall WFL for tasks assessed ?  ?Lower Extremity Assessment ?Lower Extremity Assessment: Defer to PT evaluation ?  ?Cervical / Trunk Assessment ?Cervical / Trunk Assessment: Normal ?  ?Communication Communication ?Communication: No difficulties ?  ?Cognition Arousal/Alertness: Awake/alert ?Behavior During Therapy: [facility] for tasks assessed/performed ?Overall Cognitive Status: Within Functional Limits for tasks assessed ?  ?  ?  ?  ?  ?  ?  ?  ?  ?  ?  ?  ?  ?  ?  ?  ?General Comments: plesant and excited to transition home today ?  ?  ?General Comments    ? ?  ?Exercises   ?  ?Shoulder Instructions    ? ? ?Home Living Family/patient expects to be discharged to:: Private residence ?Living Arrangements: Spouse/significant other ?Available Help at Discharge: Family ?Type of Home: Apartment ?Home Access: Stairs to enter ?Entrance Stairs-Number of Steps: 8 ?Entrance Stairs-Rails: Right;Left ?Home Layout: One level ?  ?  ?Bathroom Shower/Tub: Tub/shower unit ?  ?Bathroom Toilet: Standard ?Bathroom Accessibility: Yes ?  ?Home Equipment: Kasandra Knudsen - single point;Shower seat ?  ?Additional Comments: pt's wife is assisting him ?  ? ?  ?Prior Functioning/Environment Prior Level of Function : Independent/Modified Independent ?  ?  ?  ?  ?  ?  ?Mobility Comments: Pt reports using SPC PRN around the home or reaches for furniture/walls for support. reports 1 fall ~3 wks ago. ?ADLs Comments: pt reports wife is helping more with bathing/dressing with LB  as needed       based on fatigue ?  ? ?  ?  ?OT Problem List: Decreased activity tolerance;Impaired balance (sitting and/or standing);Decreased safety awareness;Decreased knowledge of precautions;Decreased knowledge of use of DME or AE ?  ?   ?OT Treatment/Interventions:  Self-care/ADL training;Therapeutic exercise;Neuromuscular education;Energy conservation;DME and/or AE instruction;Therapeutic activities;Balance training;Patient/family education  ?  ?OT Goals(Current goals can be found in the care plan section) Acute Rehab OT Goals ?Patient Stated Goal: to go home today ?OT Goal Formulation: With patient ?Time For Goal Achievement: 05/17/21 ?Potential to Achieve Goals: Good  ?OT Frequency: Min 2X/week ?  ? ?Co-evaluation   ?  ?  ?  ?  ? ?  ?AM-PAC OT "6 Clicks" Daily Activity     ?Outcome Measure Help from another person eating meals?: None ?Help from another person taking care of personal grooming?: None ?Help from another person toileting, which includes using toliet, bedpan, or urinal?: A Little ?Help from another person bathing (including washing, rinsing, drying)?: A Little ?Help from another person to put on and taking off regular upper body clothing?: A Little ?Help from another person to put on and taking off regular lower body clothing?: A Little ?6 Click Score: 20 ?  ?End of Session Equipment Utilized During Treatment: Rolling walker (2 wheels) ?Nurse Communication: Mobility status ? ?Activity Tolerance: Patient tolerated treatment well ?Patient left: in chair;with call bell/phone within reach ? ?OT Visit Diagnosis: Unsteadiness on feet (R26.81);Other abnormalities of gait and mobility (R26.89)  ?              ?Time: 3825-0539 ?  OT Time Calculation (min): 21 min ?Charges:  OT General Charges ?$OT Visit: 1 Visit ?OT Evaluation ?$OT Eval Low Complexity: 1 Low ? ?Claudia Alvizo OTR/L, MS ?Acute Rehabilitation Department ?Office# [phone] ?Pager# [phone] ? ? ?[place] ?05/03/2021, 10:25 AM ?

## 2021-05-04 ENCOUNTER — Inpatient Hospital Stay: Payer: Medicaid Other | Admitting: Dietician

## 2021-05-04 ENCOUNTER — Inpatient Hospital Stay: Payer: Medicaid Other | Admitting: Nurse Practitioner

## 2021-05-04 ENCOUNTER — Inpatient Hospital Stay: Payer: Medicaid Other

## 2021-05-04 ENCOUNTER — Telehealth: Payer: Self-pay

## 2021-05-04 ENCOUNTER — Inpatient Hospital Stay: Payer: Medicaid Other | Admitting: Internal Medicine

## 2021-05-04 ENCOUNTER — Encounter: Payer: Self-pay | Admitting: Internal Medicine

## 2021-05-04 NOTE — Telephone Encounter (Signed)
Notified Patient of completion of Disability claim form. Fax transmission confirmation received. Copy of form placed in bin at Registration Desk #1 for pick-up as requested by Patient. Provided Patient with date and time of next appointment as requested. No other needs or concerns voiced at this time. ?

## 2021-05-04 NOTE — Telephone Encounter (Signed)
Patient discharged from hospital on 05/03/21. Spoke with pt to advise that all of his apt's at [facility] had been cancelled for today and will be rescheduled. He understands that his Palliative Care apt has been rescheduled to 05/13/21 @ 2:30 PM.  ?

## 2021-05-05 ENCOUNTER — Other Ambulatory Visit: Payer: Self-pay | Admitting: Nurse Practitioner

## 2021-05-05 ENCOUNTER — Encounter: Payer: Self-pay | Admitting: Internal Medicine

## 2021-05-05 ENCOUNTER — Other Ambulatory Visit (HOSPITAL_COMMUNITY): Payer: Self-pay

## 2021-05-05 ENCOUNTER — Other Ambulatory Visit: Payer: Self-pay | Admitting: General Surgery

## 2021-05-05 ENCOUNTER — Other Ambulatory Visit: Payer: Self-pay

## 2021-05-05 ENCOUNTER — Telehealth: Payer: Self-pay

## 2021-05-05 DIAGNOSIS — K5720 Diverticulitis of large intestine with perforation and abscess without bleeding: Secondary | ICD-10-CM

## 2021-05-05 MED ORDER — OXYCODONE HCL 5 MG PO TABS
5.0000 mg | ORAL_TABLET | Freq: Four times a day (QID) | ORAL | 0 refills | Status: DC | PRN
  Filled 2021-05-05: qty 90, 12d supply, fill #0

## 2021-05-05 NOTE — Telephone Encounter (Signed)
Pt called requesting a refill of Oxycodone. ?

## 2021-05-06 ENCOUNTER — Ambulatory Visit: Payer: 59

## 2021-05-06 ENCOUNTER — Encounter: Payer: Self-pay | Admitting: Pharmacy Technician

## 2021-05-06 ENCOUNTER — Encounter: Payer: Self-pay | Admitting: Internal Medicine

## 2021-05-06 ENCOUNTER — Other Ambulatory Visit (HOSPITAL_COMMUNITY): Payer: Self-pay

## 2021-05-06 NOTE — Telephone Encounter (Signed)
Erroneous encounter

## 2021-05-07 ENCOUNTER — Encounter: Payer: Self-pay | Admitting: Internal Medicine

## 2021-05-07 ENCOUNTER — Ambulatory Visit
Admission: RE | Admit: 2021-05-07 | Discharge: 2021-05-07 | Disposition: A | Discharge status: Home / Self Care | Payer: Medicaid Other | Source: Ambulatory Visit | Attending: Internal Medicine | Admitting: Internal Medicine

## 2021-05-07 DIAGNOSIS — C7931 Secondary malignant neoplasm of brain: Secondary | ICD-10-CM

## 2021-05-07 MED ORDER — GADOBENATE DIMEGLUMINE 529 MG/ML IV SOLN
20.0000 mL | Freq: Once | INTRAVENOUS | Status: AC | PRN
  Administered 2021-05-07: 20 mL via INTRAVENOUS

## 2021-05-10 ENCOUNTER — Inpatient Hospital Stay: Payer: Medicaid Other | Attending: Internal Medicine

## 2021-05-10 ENCOUNTER — Encounter: Payer: Self-pay | Admitting: Internal Medicine

## 2021-05-10 DIAGNOSIS — C3432 Malignant neoplasm of lower lobe, left bronchus or lung: Secondary | ICD-10-CM | POA: Insufficient documentation

## 2021-05-10 DIAGNOSIS — I10 Essential (primary) hypertension: Secondary | ICD-10-CM | POA: Insufficient documentation

## 2021-05-10 DIAGNOSIS — G893 Neoplasm related pain (acute) (chronic): Secondary | ICD-10-CM | POA: Insufficient documentation

## 2021-05-10 DIAGNOSIS — F17210 Nicotine dependence, cigarettes, uncomplicated: Secondary | ICD-10-CM | POA: Insufficient documentation

## 2021-05-10 DIAGNOSIS — K5900 Constipation, unspecified: Secondary | ICD-10-CM | POA: Insufficient documentation

## 2021-05-10 DIAGNOSIS — C7931 Secondary malignant neoplasm of brain: Secondary | ICD-10-CM | POA: Insufficient documentation

## 2021-05-12 ENCOUNTER — Other Ambulatory Visit: Payer: Self-pay

## 2021-05-12 ENCOUNTER — Emergency Department (HOSPITAL_COMMUNITY): Payer: Medicaid Other

## 2021-05-12 ENCOUNTER — Encounter (HOSPITAL_COMMUNITY): Payer: Self-pay | Admitting: Emergency Medicine

## 2021-05-12 ENCOUNTER — Inpatient Hospital Stay (HOSPITAL_COMMUNITY)
Admission: EM | Admit: 2021-05-12 | Discharge: 2021-05-17 | DRG: 392 | Disposition: A | Discharge status: Home / Self Care | Payer: Medicaid Other | Attending: Student | Admitting: Student

## 2021-05-12 DIAGNOSIS — Z923 Personal history of irradiation: Secondary | ICD-10-CM | POA: Diagnosis not present

## 2021-05-12 DIAGNOSIS — D630 Anemia in neoplastic disease: Secondary | ICD-10-CM | POA: Diagnosis present

## 2021-05-12 DIAGNOSIS — C3412 Malignant neoplasm of upper lobe, left bronchus or lung: Secondary | ICD-10-CM | POA: Diagnosis present

## 2021-05-12 DIAGNOSIS — F419 Anxiety disorder, unspecified: Secondary | ICD-10-CM | POA: Diagnosis not present

## 2021-05-12 DIAGNOSIS — Z9221 Personal history of antineoplastic chemotherapy: Secondary | ICD-10-CM | POA: Diagnosis not present

## 2021-05-12 DIAGNOSIS — E669 Obesity, unspecified: Secondary | ICD-10-CM | POA: Diagnosis present

## 2021-05-12 DIAGNOSIS — K5780 Diverticulitis of intestine, part unspecified, with perforation and abscess without bleeding: Secondary | ICD-10-CM | POA: Diagnosis present

## 2021-05-12 DIAGNOSIS — N400 Enlarged prostate without lower urinary tract symptoms: Secondary | ICD-10-CM | POA: Diagnosis present

## 2021-05-12 DIAGNOSIS — I2510 Atherosclerotic heart disease of native coronary artery without angina pectoris: Secondary | ICD-10-CM | POA: Diagnosis present

## 2021-05-12 DIAGNOSIS — C3492 Malignant neoplasm of unspecified part of left bronchus or lung: Secondary | ICD-10-CM | POA: Diagnosis not present

## 2021-05-12 DIAGNOSIS — I10 Essential (primary) hypertension: Secondary | ICD-10-CM | POA: Diagnosis not present

## 2021-05-12 DIAGNOSIS — E6609 Other obesity due to excess calories: Secondary | ICD-10-CM | POA: Diagnosis not present

## 2021-05-12 DIAGNOSIS — Z888 Allergy status to other drugs, medicaments and biological substances status: Secondary | ICD-10-CM | POA: Diagnosis not present

## 2021-05-12 DIAGNOSIS — C7931 Secondary malignant neoplasm of brain: Secondary | ICD-10-CM | POA: Diagnosis present

## 2021-05-12 DIAGNOSIS — G4730 Sleep apnea, unspecified: Secondary | ICD-10-CM | POA: Diagnosis present

## 2021-05-12 DIAGNOSIS — Z6833 Body mass index (BMI) 33.0-33.9, adult: Secondary | ICD-10-CM

## 2021-05-12 DIAGNOSIS — F17210 Nicotine dependence, cigarettes, uncomplicated: Secondary | ICD-10-CM | POA: Diagnosis present

## 2021-05-12 DIAGNOSIS — R1032 Left lower quadrant pain: Principal | ICD-10-CM

## 2021-05-12 DIAGNOSIS — C787 Secondary malignant neoplasm of liver and intrahepatic bile duct: Secondary | ICD-10-CM | POA: Diagnosis present

## 2021-05-12 DIAGNOSIS — K5720 Diverticulitis of large intestine with perforation and abscess without bleeding: Secondary | ICD-10-CM | POA: Diagnosis not present

## 2021-05-12 DIAGNOSIS — I252 Old myocardial infarction: Secondary | ICD-10-CM | POA: Diagnosis not present

## 2021-05-12 DIAGNOSIS — Z20822 Contact with and (suspected) exposure to covid-19: Secondary | ICD-10-CM | POA: Diagnosis present

## 2021-05-12 DIAGNOSIS — D649 Anemia, unspecified: Secondary | ICD-10-CM | POA: Diagnosis present

## 2021-05-12 DIAGNOSIS — Z79899 Other long term (current) drug therapy: Secondary | ICD-10-CM

## 2021-05-12 DIAGNOSIS — Z8249 Family history of ischemic heart disease and other diseases of the circulatory system: Secondary | ICD-10-CM | POA: Diagnosis not present

## 2021-05-12 DIAGNOSIS — G3184 Mild cognitive impairment, so stated: Secondary | ICD-10-CM

## 2021-05-12 DIAGNOSIS — Z801 Family history of malignant neoplasm of trachea, bronchus and lung: Secondary | ICD-10-CM

## 2021-05-12 DIAGNOSIS — K219 Gastro-esophageal reflux disease without esophagitis: Secondary | ICD-10-CM | POA: Diagnosis present

## 2021-05-12 LAB — BASIC METABOLIC PANEL
Anion gap: 6 (ref 5–15)
BUN: 11 mg/dL (ref 6–20)
CO2: 29 mmol/L (ref 22–32)
Calcium: 9.3 mg/dL (ref 8.9–10.3)
Chloride: 107 mmol/L (ref 98–111)
Creatinine, Ser: 0.86 mg/dL (ref 0.61–1.24)
GFR, Estimated: >60 mL/min (ref >60)
Glucose, Bld: 101 mg/dL — ABNORMAL HIGH (ref 70–99)
Potassium: 4 mmol/L (ref 3.5–5.1)
Sodium: 142 mmol/L (ref 135–145)

## 2021-05-12 LAB — HEPATIC FUNCTION PANEL
ALT: 11 U/L (ref 0–44)
AST: 22 U/L (ref 15–41)
Albumin: 3.2 g/dL — ABNORMAL LOW (ref 3.5–5.0)
Alkaline Phosphatase: 107 U/L (ref 38–126)
Bilirubin, Direct: <0.1 mg/dL (ref 0.0–0.2)
Total Bilirubin: 0.4 mg/dL (ref 0.3–1.2)
Total Protein: 6.8 g/dL (ref 6.5–8.1)

## 2021-05-12 LAB — BRAIN NATRIURETIC PEPTIDE: B Natriuretic Peptide: 53.4 pg/mL (ref 0.0–100.0)

## 2021-05-12 LAB — CBC
HCT: 34 % — ABNORMAL LOW (ref 39.0–52.0)
Hemoglobin: 10.8 g/dL — ABNORMAL LOW (ref 13.0–17.0)
MCH: 29.4 pg (ref 26.0–34.0)
MCHC: 31.8 g/dL (ref 30.0–36.0)
MCV: 92.6 fL (ref 80.0–100.0)
Platelets: 268 10*3/uL (ref 150–400)
RBC: 3.67 MIL/uL — ABNORMAL LOW (ref 4.22–5.81)
RDW: 15.2 % (ref 11.5–15.5)
WBC: 13.6 10*3/uL — ABNORMAL HIGH (ref 4.0–10.5)
nRBC: 0 % (ref 0.0–0.2)

## 2021-05-12 LAB — RESP PANEL BY RT-PCR (FLU A&B, COVID) ARPGX2
Influenza A by PCR: NEGATIVE
Influenza B by PCR: NEGATIVE
SARS Coronavirus 2 by RT PCR: NEGATIVE

## 2021-05-12 LAB — TROPONIN I (HIGH SENSITIVITY)
Troponin I (High Sensitivity): 7 ng/L (ref <18)
Troponin I (High Sensitivity): 6 ng/L (ref <18)

## 2021-05-12 MED ORDER — HYDROMORPHONE HCL 1 MG/ML IJ SOLN
1.0000 mg | Freq: Once | INTRAMUSCULAR | Status: AC
  Administered 2021-05-13: 1 mg via INTRAVENOUS
  Filled 2021-05-12: qty 1

## 2021-05-12 MED ORDER — IOHEXOL 350 MG/ML SOLN
100.0000 mL | Freq: Once | INTRAVENOUS | Status: AC | PRN
  Administered 2021-05-12: 100 mL via INTRAVENOUS

## 2021-05-12 MED ORDER — ACETAMINOPHEN 650 MG RE SUPP: 650.0000 mg | Freq: Four times a day (QID) | RECTAL | Status: DC | PRN

## 2021-05-12 MED ORDER — METRONIDAZOLE 500 MG/100ML IV SOLN
500.0000 mg | Freq: Once | INTRAVENOUS | Status: AC
  Administered 2021-05-13: 500 mg via INTRAVENOUS
  Filled 2021-05-12: qty 100

## 2021-05-12 MED ORDER — ONDANSETRON HCL 4 MG/2ML IJ SOLN
4.0000 mg | Freq: Once | INTRAMUSCULAR | Status: AC
  Administered 2021-05-12: 4 mg via INTRAVENOUS
  Filled 2021-05-12: qty 2

## 2021-05-12 MED ORDER — SODIUM CHLORIDE 0.9 % IV SOLN
2.0000 g | INTRAVENOUS | Status: DC
  Administered 2021-05-13 – 2021-05-14 (×2): 2 g via INTRAVENOUS
  Filled 2021-05-12 (×2): qty 20

## 2021-05-12 MED ORDER — MORPHINE SULFATE (PF) 2 MG/ML IV SOLN: 1.0000 mg | INTRAVENOUS | Status: DC | PRN

## 2021-05-12 MED ORDER — CEFTRIAXONE SODIUM 2 G IJ SOLR
2.0000 g | Freq: Once | INTRAMUSCULAR | Status: AC
  Administered 2021-05-12: 2 g via INTRAVENOUS
  Filled 2021-05-12: qty 20

## 2021-05-12 MED ORDER — ACETAMINOPHEN 325 MG PO TABS: 650.0000 mg | ORAL_TABLET | Freq: Four times a day (QID) | ORAL | Status: DC | PRN

## 2021-05-12 MED ORDER — MORPHINE SULFATE (PF) 4 MG/ML IV SOLN
4.0000 mg | Freq: Once | INTRAVENOUS | Status: AC
  Administered 2021-05-12: 4 mg via INTRAVENOUS
  Filled 2021-05-12: qty 1

## 2021-05-12 NOTE — ED Triage Notes (Signed)
Pt reports chest pain that radiates into lower right abdomen. Pt reports pain in incision site at bag on left side.  ?

## 2021-05-12 NOTE — ED Provider Triage Note (Signed)
Emergency Medicine Provider Triage Evaluation Note ? ?Raymond [name] , a 61 y.o. male  was evaluated in triage.  Pt complains of chest pain shooting from his substernal area onto the right lower abdomen, this is exacerbated with any ambulation as he feels that he is very short of breath.  Patient does walk with a cane.  He does have a history of brain, lung cancer that was diagnosed in August 2022, he is currently receiving palliative chemotherapy along with radiation. ? ?Review of Systems  ?Positive: Shortness of breath, right lower quadrant pain, cough ?Negative: Fever, leg swelling ? ?Physical Exam  ?BP 140/81 (BP Location: Left Arm)   Pulse 97   Temp 99.2 ?F (37.3 ?C) (Oral)   Resp 16   SpO2 94%  ?Gen:   Awake, no distress   ?Resp:  Normal effort  ?MSK:   Moves extremities without difficulty  ?Other:  Pain along the drain in lower left side of his abdomen ? ?Medical Decision Making  ?Medically screening exam initiated at 4:49 PM.  Appropriate orders placed.  Raymond [name] was informed that the remainder of the evaluation will be completed by another provider, this initial triage assessment does not replace that evaluation, and the importance of remaining in the ED until their evaluation is complete. ? ?Patient undergoing palliative chemotherapy, radiation with chest pain, abdominal pain and shortness of breath.  Not anticoagulated, also endorsing a cough and a low-grade temp on arrival at 99.2, labs have been ordered. ?  Janeece Fitting, PA-C ?05/12/21 1657 ? ?

## 2021-05-12 NOTE — ED Provider Notes (Signed)
?[facility] DEPT ?Provider Note ? ? ?CSN: [id number] ?Arrival date & time: 05/12/21  1631 ? ?  ? ?History ? ?Chief Complaint  ?Patient presents with  ? Chest Pain  ? Abdominal Pain  ? ? ?Raymond [name] is a 61 y.o. male. ? 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  Patient reports his symptoms have been exacerbated with any type of activity.  Feels like he is very short of breath and is hard for him to take a deep breath.  He does endorse a cough with some yellow sputum, refers to this as very thick.  He also endorses pain along the left lower abdomen where there is a drainage in place to help with abscess drainage.  Patient was previously on palliative chemotherapy, this was discontinued due to no response to treatment, he is now on radiation therapy followed closely by Dr. Eddie Dibbles tooth.  He does not endorse any fevers at home, however does report feeling very hot.  He denies any leg swelling, URI symptoms or other complaints. Prior hx of CAD and MI several years ago.  ? ?The history is provided by the patient.  ?Chest Pain ?Pain location:  L chest ?Pain quality: sharp   ?Pain radiates to:  Epigastrium ?Pain severity:  Moderate ?Onset quality:  Gradual ?Progression:  Worsening ?Chronicity:  New ?Context: breathing and movement   ?Relieved by:  Nothing ?Worsened by:  Exertion ?Associated symptoms: abdominal pain and shortness of breath   ?Associated symptoms: no anxiety and no fever   ?Abdominal Pain ?Associated symptoms: chest pain and shortness of breath   ?Associated symptoms: no fever   ? ?  ? ?Home Medications ?Prior to Admission medications   ?Medication Sig Start Date End Date Taking? Authorizing Provider  ?acetaminophen (TYLENOL) 650 MG CR tablet Take 650 mg by mouth every 8 (eight) hours as needed for  pain.   Yes [provider]  ?amoxicillin-clavulanate (AUGMENTIN) 875-125 MG tablet Take 1 tablet by mouth every 12 hours for 8 days. 05/03/21 05/12/21 Yes Kayleen Memos, DO  ?Ensure Plus (ENSURE PLUS) LIQD Take 237 mLs by mouth 2 (two) times daily between meals. Chocolate   Yes [provider]  ?fluticasone (FLONASE) 50 MCG/ACT nasal spray Place 1 spray into both nostrils daily. 03/16/21  Yes Pickenpack-Cousar, Carlena Sax, NP  ?folic acid (FOLVITE) 1 MG tablet Take 1 tablet (1 mg total) by mouth daily. 05/03/21 08/01/21 Yes Kayleen Memos, DO  ?gabapentin (NEURONTIN) 300 MG capsule Take 2 capsules (600 mg total) by mouth at bedtime. 04/13/21 05/13/21 Yes Pickenpack-Cousar, Carlena Sax, NP  ?memantine (NAMENDA) 10 MG tablet Take 1 tablet (10 mg total) by mouth 2 (two) times daily. 01/26/21  Yes [name], PA-C  ?mirtazapine (REMERON) 30 MG tablet Take 1 tablet (30 mg total) by mouth at bedtime. 04/13/21  Yes [name], [name], PA-C  ?Multiple Vitamin (MULTIVITAMIN WITH MINERALS) TABS tablet Take 1 tablet by mouth daily. ?Patient taking differently: Take 1 tablet by mouth 2 (two) times daily. Ultimate Man 03/10/21  Yes Mercy Riding, MD  ?ondansetron (ZOFRAN) 8 MG tablet Take 1 tablet (8 mg total) by mouth every 8 (eight) hours as needed for nausea or vomiting. 04/13/21  Yes Pickenpack-Cousar, Carlena Sax, NP  ?oxyCODONE (OXY IR/ROXICODONE) 5 MG immediate  release tablet Take 1-2 tablets (5-10 mg total) by mouth every 6 (six) hours as needed for severe pain. 05/05/21  Yes Pickenpack-Cousar, Carlena Sax, NP  ?prochlorperazine (COMPAZINE) 10 MG tablet Take 1 tablet (10 mg total) by mouth every 6 (six) hours as needed for nausea or vomiting. 02/25/21  Yes Pickenpack-Cousar, Carlena Sax, NP  ?vitamin B-12 (CYANOCOBALAMIN) 1000 MCG tablet Take 1,000 mcg by mouth daily.   Yes [provider]  ?Wheat Dextrin (BENEFIBER DRINK MIX) PACK Take 1 Package by mouth daily at 6 (six) AM.   Yes [provider]  ?amLODipine (NORVASC) 10 MG tablet Take 1 tablet by mouth daily. ?Patient not taking: Reported on 05/12/2021 03/10/21   Mercy Riding, MD  ?dexamethasone (DECADRON) 4 MG tablet Please take 2 tablets 2 times a day the day before, the day of, and the day after treatment. ?Patient not taking: Reported on 05/12/2021 02/15/21   [name], [name], PA-C  ?senna-docusate (SENNA S) 8.6-50 MG tablet Take 2 tablets by mouth daily. 04/13/21   Pickenpack-Cousar, Carlena Sax, NP  ?   ? ?Allergies    ?Benadryl [diphenhydramine]   ? ?Review of Systems   ?Review of Systems  ?Constitutional:  Negative for fever.  ?Respiratory:  Positive for shortness of breath.   ?Cardiovascular:  Positive for chest pain.  ?Gastrointestinal:  Positive for abdominal pain.  ?All other systems reviewed and are negative. ? ?Physical Exam ?Updated Vital Signs ?BP 133/83   Pulse 80   Temp 99.2 ?F (37.3 ?C) (Oral)   Resp 11   SpO2 95%  ?Physical Exam ?Vitals and nursing note reviewed.  ?Constitutional:   ?   Appearance: He is ill-appearing.  ?   Comments: Ill appearing at baseline.  ?HENT:  ?   Head: Normocephalic and atraumatic.  ?Cardiovascular:  ?   Rate and Rhythm: Normal rate.  ?   Heart sounds: No murmur heard. ?Pulmonary:  ?   Effort: Pulmonary effort is normal.  ?   Breath sounds: No decreased breath sounds or wheezing.  ?Abdominal:  ?   General: Bowel sounds are decreased.  ?   Palpations: Abdomen is soft.  ?   Tenderness: There is abdominal tenderness in the left lower quadrant. There is no right CVA tenderness or left CVA tenderness.  ? ? ?Musculoskeletal:  ?   Cervical back: Normal range of motion and neck supple.  ?   Right lower leg: No tenderness. No edema.  ?   Left lower leg: No tenderness. No edema.  ?Skin: ?   General: Skin is warm and dry.  ?Neurological:  ?   Mental Status: He is alert and oriented to person, place, and time.  ? ? ?ED Results / Procedures / Treatments   ?Labs ?(all labs ordered are listed, but only abnormal  results are displayed) ?Labs Reviewed  ?BASIC METABOLIC PANEL - Abnormal; Notable for the following components:  ?    Result Value  ? Glucose, Bld 101 (*)   ? All other components within normal limits  ?CBC - Abnormal; Notable for the following components:  ? WBC 13.6 (*)   ? RBC 3.67 (*)   ? Hemoglobin 10.8 (*)   ? HCT 34.0 (*)   ? All other components within normal limits  ?HEPATIC FUNCTION PANEL - Abnormal; Notable for the following components:  ? Albumin 3.2 (*)   ? All other components within normal limits  ?RESP PANEL BY RT-PCR (FLU A&B, COVID) ARPGX2  ?BRAIN NATRIURETIC PEPTIDE  ?  COMPREHENSIVE METABOLIC PANEL  ?CBC  ?TROPONIN I (HIGH SENSITIVITY)  ?TROPONIN I (HIGH SENSITIVITY)  ? ? ?EKG ?EKG Interpretation ? ?Date/Time:  Wednesday May 12 2021 16:42:50 EDT ?Ventricular Rate:  97 ?PR Interval:  182 ?QRS Duration: 89 ?QT Interval:  311 ?QTC Calculation: 395 ?R Axis:   6 ?Text Interpretation: Sinus rhythm Low voltage, precordial leads Borderline repolarization abnormality Confirmed by Nanda Quinton [phone]) on 05/12/2021 4:47:47 PM ? ?Radiology ?DG Chest 2 View ? ?Result Date: 05/12/2021 ?CLINICAL DATA:  Chest pain, shortness of breath EXAM: CHEST - 2 VIEW COMPARISON:  Previous studies including the chest radiograph done on 01/24/2021 and CT chest done on 04/29/2021 FINDINGS: Cardiac size is within normal limits. There is 5.7 cm homogeneous opacity in the left upper lung fields with small central fiduciary marker which has not changed significantly since 04/29/2021. There are ill-defined faint nodular densities in both lungs suggesting possible pulmonary metastatic disease. No definite new focal infiltrates are seen. There is no pleural effusion or pneumothorax. IMPRESSION: There is 5.7 cm radiopacity in the lateral left upper lung fields suggesting possible malignant neoplastic process. Part of this finding may suggest pneumonia. There are multiple small faint nodular densities in both lungs suggesting possible  pulmonary metastatic disease. There are no signs of new focal infiltrates or pulmonary edema. Electronically Signed   By: Elmer Picker M.D.   On: 05/12/2021 17:42  ? ?CT Angio Chest PE W and/or Wo Contrast

## 2021-05-12 NOTE — H&P (Signed)
?History and Physical  ? ? ?Raymond [name] BJS:[id number] DOB: 1960/12/02 DOA: 05/12/2021 ? ?PCP: Pcp, No  ?Patient coming from: Home. ? ?Chief Complaint: Abdominal pain. ? ?HPI: Raymond [name] is a 61 y.o. male with history of metastatic lung cancer, hypertension who was recently being treated for sigmoid diverticulitis with abscess was discharged about 10 days ago after drain was placed stated he was just done with antibiotics started having abdominal pain again over the last few days.  Denies any nausea vomiting or diarrhea.  Pain is mostly in the left lower quadrant.  Has subjective feeling of fever chills. ? ?ED Course: In the ER patient had a CT abdomen pelvis which shows a 2.6 cm fluid collection around the sigmoid colon.  ER physician discussed with on-call general surgeon Dr. Rosendo Gros who advised consulting interventional radiology for draining the collection of abscess.  Patient started on empiric antibiotics.  Labs show leukocytosis. ? ?Review of Systems: As per HPI, rest all negative. ? ? ?Past Medical History:  ?Diagnosis Date  ? Arthritis   ? Baker's cyst   ? right  ? Diverticulitis   ? GERD (gastroesophageal reflux disease)   ? Heart attack ([place]) 01/04/2004  ? History of hiatal hernia   ? Hypertension   ? Malignant neoplasm of upper lobe of left lung ([place]) 09/24/2020  ? Pneumonia   ? as a child  ? Sleep apnea   ? no longer on Cpap  ? ? ?Past Surgical History:  ?Procedure Laterality Date  ? BIOPSY  01/26/2021  ? Procedure: BIOPSY;  Surgeon: Ronnette Juniper, MD;  Location: [facility] ENDOSCOPY;  Service: Gastroenterology;;  ? BRONCHIAL BIOPSY  09/14/2020  ? Procedure: BRONCHIAL BIOPSIES;  Surgeon: Collene Gobble, MD;  Location: [facility] ENDOSCOPY;  Service: Cardiopulmonary;;  ? BRONCHIAL BRUSHINGS  09/14/2020  ? Procedure: BRONCHIAL BRUSHINGS;  Surgeon: Collene Gobble, MD;  Location: [facility] ENDOSCOPY;  Service: Cardiopulmonary;;  ? BRONCHIAL NEEDLE ASPIRATION BIOPSY  09/14/2020  ? Procedure: BRONCHIAL NEEDLE ASPIRATION BIOPSIES;   Surgeon: Collene Gobble, MD;  Location: [facility] ENDOSCOPY;  Service: Cardiopulmonary;;  ? CARDIAC CATHETERIZATION    ? CARPAL TUNNEL RELEASE    ? right  ? COLONOSCOPY WITH PROPOFOL N/A 01/26/2021  ? Procedure: COLONOSCOPY WITH PROPOFOL;  Surgeon: Ronnette Juniper, MD;  Location: [facility] ENDOSCOPY;  Service: Gastroenterology;  Laterality: N/A;  ? FIDUCIAL MARKER PLACEMENT  09/14/2020  ? Procedure: FIDUCIAL MARKER PLACEMENT;  Surgeon: Collene Gobble, MD;  Location: [facility] ENDOSCOPY;  Service: Cardiopulmonary;;  ? HAND SURGERY    ? left  ? IR CATHETER TUBE CHANGE  03/19/2021  ? IR CATHETER TUBE CHANGE  04/30/2021  ? IR FLUORO RM 30-60 MIN  03/18/2021  ? IR RADIOLOGIST EVAL & MGMT  04/07/2021  ? KNEE SURGERY    ? left  ? VIDEO BRONCHOSCOPY N/A 09/14/2020  ? Procedure: ROBOTIC VIDEO BRONCHOSCOPY WITH FLUORO;  Surgeon: Collene Gobble, MD;  Location: [facility] ENDOSCOPY;  Service: Cardiopulmonary;  Laterality: N/A;  ? VIDEO BRONCHOSCOPY WITH ENDOBRONCHIAL ULTRASOUND N/A 09/14/2020  ? Procedure: VIDEO BRONCHOSCOPY WITH ENDOBRONCHIAL ULTRASOUND;  Surgeon: Collene Gobble, MD;  Location: [facility] ENDOSCOPY;  Service: Cardiopulmonary;  Laterality: N/A;  ? VIDEO BRONCHOSCOPY WITH RADIAL ENDOBRONCHIAL ULTRASOUND  09/14/2020  ? Procedure: VIDEO BRONCHOSCOPY WITH RADIAL ENDOBRONCHIAL ULTRASOUND;  Surgeon: Collene Gobble, MD;  Location: [place] ENDOSCOPY;  Service: Cardiopulmonary;;  ? ? ? reports that he has been smoking cigarettes. He has never used smokeless tobacco. He reports that he does not drink alcohol and  does not use drugs. ? ?Allergies  ?Allergen Reactions  ? Benadryl [Diphenhydramine] Itching  ?  01/24/21 pt states this was a one time reaction and that he takes now when needed, also reported 05/12/21  ? ? ?Family History  ?Problem Relation Age of Onset  ? Lung cancer Mother   ? Heart failure Sister   ? ? ?Prior to Admission medications   ?Medication Sig Start Date End Date Taking? Authorizing Provider  ?acetaminophen (TYLENOL) 650 MG CR tablet Take 650 mg by  mouth every 8 (eight) hours as needed for pain.   Yes [provider]  ?amoxicillin-clavulanate (AUGMENTIN) 875-125 MG tablet Take 1 tablet by mouth every 12 hours for 8 days. 05/03/21 05/12/21 Yes Kayleen Memos, DO  ?Ensure Plus (ENSURE PLUS) LIQD Take 237 mLs by mouth 2 (two) times daily between meals. Chocolate   Yes [provider]  ?fluticasone (FLONASE) 50 MCG/ACT nasal spray Place 1 spray into both nostrils daily. 03/16/21  Yes Pickenpack-Cousar, Carlena Sax, NP  ?folic acid (FOLVITE) 1 MG tablet Take 1 tablet (1 mg total) by mouth daily. 05/03/21 08/01/21 Yes Kayleen Memos, DO  ?gabapentin (NEURONTIN) 300 MG capsule Take 2 capsules (600 mg total) by mouth at bedtime. 04/13/21 05/13/21 Yes Pickenpack-Cousar, Carlena Sax, NP  ?memantine (NAMENDA) 10 MG tablet Take 1 tablet (10 mg total) by mouth 2 (two) times daily. 01/26/21  Yes [name], PA-C  ?mirtazapine (REMERON) 30 MG tablet Take 1 tablet (30 mg total) by mouth at bedtime. 04/13/21  Yes [name], [name], PA-C  ?Multiple Vitamin (MULTIVITAMIN WITH MINERALS) TABS tablet Take 1 tablet by mouth daily. ?Patient taking differently: Take 1 tablet by mouth 2 (two) times daily. [employer] 03/10/21  Yes Mercy Riding, MD  ?ondansetron (ZOFRAN) 8 MG tablet Take 1 tablet (8 mg total) by mouth every 8 (eight) hours as needed for nausea or vomiting. 04/13/21  Yes Pickenpack-Cousar, Carlena Sax, NP  ?oxyCODONE (OXY IR/ROXICODONE) 5 MG immediate release tablet Take 1-2 tablets (5-10 mg total) by mouth every 6 (six) hours as needed for severe pain. 05/05/21  Yes Pickenpack-Cousar, Carlena Sax, NP  ?prochlorperazine (COMPAZINE) 10 MG tablet Take 1 tablet (10 mg total) by mouth every 6 (six) hours as needed for nausea or vomiting. 02/25/21  Yes Pickenpack-Cousar, Carlena Sax, NP  ?vitamin B-12 (CYANOCOBALAMIN) 1000 MCG tablet Take 1,000 mcg by mouth daily.   Yes [provider]  ?Wheat Dextrin (BENEFIBER DRINK MIX) PACK Take 1 Package by mouth daily  at 6 (six) AM.   Yes [provider]  ?amLODipine (NORVASC) 10 MG tablet Take 1 tablet by mouth daily. ?Patient not taking: Reported on 05/12/2021 03/10/21   Mercy Riding, MD  ?dexamethasone (DECADRON) 4 MG tablet Please take 2 tablets 2 times a day the day before, the day of, and the day after treatment. ?Patient not taking: Reported on 05/12/2021 02/15/21   [name], [name], PA-C  ?senna-docusate (SENNA S) 8.6-50 MG tablet Take 2 tablets by mouth daily. 04/13/21   Pickenpack-Cousar, Carlena Sax, NP  ? ? ?Physical Exam: ?Constitutional: Moderately built and nourished. ?Vitals:  ? 05/12/21 2000 05/12/21 2030 05/12/21 2045 05/12/21 2300  ?BP: 111/84 122/80 (!) 168/152 (!) 162/87  ?Pulse: 81 80 81 83  ?Resp: 14 11 11 13   ?Temp:      ?TempSrc:      ?SpO2: 100% 97% 99% 100%  ? ?Eyes: Anicteric no pallor. ?ENMT: No discharge from the ears eyes nose and mouth. ?Neck: No  mass felt.  No neck rigidity. ?Respiratory: No rhonchi or crepitations. ?Cardiovascular: S1-S2 heard. ?Abdomen: Tenderness in the left lower quadrant. ?Musculoskeletal: No edema. ?Skin: No rash. ?Neurologic: Alert awake oriented time place and person.  Moves all extremities. ?Psychiatric: Appears normal.  Normal affect. ? ? ?Labs on Admission: I have personally reviewed following labs and imaging studies ? ?CBC: ?Recent Labs  ?Lab 05/12/21 ?1834  ?WBC 13.6*  ?HGB 10.8*  ?HCT 34.0*  ?MCV 92.6  ?PLT 268  ? ?Basic Metabolic Panel: ?Recent Labs  ?Lab 05/12/21 ?1834  ?NA 142  ?K 4.0  ?CL 107  ?CO2 29  ?GLUCOSE 101*  ?BUN 11  ?CREATININE 0.86  ?CALCIUM 9.3  ? ?GFR: ?CrCl cannot be calculated (Unknown ideal weight.). ?Liver Function Tests: ?Recent Labs  ?Lab 05/12/21 ?1834  ?AST 22  ?ALT 11  ?ALKPHOS 107  ?BILITOT 0.4  ?PROT 6.8  ?ALBUMIN 3.2*  ? ?No results for input(s): LIPASE, AMYLASE in the last 168 hours. ?No results for input(s): AMMONIA in the last 168 hours. ?Coagulation Profile: ?No results for input(s): INR, PROTIME in the last 168  hours. ?Cardiac Enzymes: ?No results for input(s): CKTOTAL, CKMB, CKMBINDEX, TROPONINI in the last 168 hours. ?BNP (last 3 results) ?No results for input(s): PROBNP in the last 8760 hours. ?HbA1C: ?No results for

## 2021-05-13 ENCOUNTER — Encounter: Payer: Self-pay | Admitting: Internal Medicine

## 2021-05-13 ENCOUNTER — Inpatient Hospital Stay: Payer: Medicaid Other | Admitting: Internal Medicine

## 2021-05-13 ENCOUNTER — Encounter (HOSPITAL_COMMUNITY): Payer: Self-pay | Admitting: Internal Medicine

## 2021-05-13 ENCOUNTER — Inpatient Hospital Stay: Payer: Medicaid Other | Admitting: Nurse Practitioner

## 2021-05-13 DIAGNOSIS — C3412 Malignant neoplasm of upper lobe, left bronchus or lung: Secondary | ICD-10-CM

## 2021-05-13 DIAGNOSIS — I10 Essential (primary) hypertension: Secondary | ICD-10-CM | POA: Diagnosis not present

## 2021-05-13 DIAGNOSIS — K5720 Diverticulitis of large intestine with perforation and abscess without bleeding: Principal | ICD-10-CM

## 2021-05-13 LAB — CBC
HCT: 31.6 % — ABNORMAL LOW (ref 39.0–52.0)
HCT: 30 % — ABNORMAL LOW (ref 39.0–52.0)
HCT: 34.4 % — ABNORMAL LOW (ref 39.0–52.0)
Hemoglobin: 9.9 g/dL — ABNORMAL LOW (ref 13.0–17.0)
Hemoglobin: 9.6 g/dL — ABNORMAL LOW (ref 13.0–17.0)
Hemoglobin: 10.7 g/dL — ABNORMAL LOW (ref 13.0–17.0)
MCH: 28.9 pg (ref 26.0–34.0)
MCH: 30.1 pg (ref 26.0–34.0)
MCH: 28.9 pg (ref 26.0–34.0)
MCHC: 31.3 g/dL (ref 30.0–36.0)
MCHC: 32 g/dL (ref 30.0–36.0)
MCHC: 31.1 g/dL (ref 30.0–36.0)
MCV: 92.4 fL (ref 80.0–100.0)
MCV: 94 fL (ref 80.0–100.0)
MCV: 93 fL (ref 80.0–100.0)
Platelets: 229 10*3/uL (ref 150–400)
Platelets: 220 10*3/uL (ref 150–400)
Platelets: 255 10*3/uL (ref 150–400)
RBC: 3.42 MIL/uL — ABNORMAL LOW (ref 4.22–5.81)
RBC: 3.19 MIL/uL — ABNORMAL LOW (ref 4.22–5.81)
RBC: 3.7 MIL/uL — ABNORMAL LOW (ref 4.22–5.81)
RDW: 15.2 % (ref 11.5–15.5)
RDW: 15.1 % (ref 11.5–15.5)
RDW: 15 % (ref 11.5–15.5)
WBC: 12.5 10*3/uL — ABNORMAL HIGH (ref 4.0–10.5)
WBC: 10.3 10*3/uL (ref 4.0–10.5)
WBC: 10.5 10*3/uL (ref 4.0–10.5)
nRBC: 0 % (ref 0.0–0.2)
nRBC: 0 % (ref 0.0–0.2)
nRBC: 0 % (ref 0.0–0.2)

## 2021-05-13 LAB — COMPREHENSIVE METABOLIC PANEL
ALT: 9 U/L (ref 0–44)
AST: 21 U/L (ref 15–41)
Albumin: 2.9 g/dL — ABNORMAL LOW (ref 3.5–5.0)
Alkaline Phosphatase: 97 U/L (ref 38–126)
Anion gap: 6 (ref 5–15)
BUN: 9 mg/dL (ref 6–20)
CO2: 29 mmol/L (ref 22–32)
Calcium: 9.2 mg/dL (ref 8.9–10.3)
Chloride: 106 mmol/L (ref 98–111)
Creatinine, Ser: 0.76 mg/dL (ref 0.61–1.24)
GFR, Estimated: >60 mL/min (ref >60)
Glucose, Bld: 101 mg/dL — ABNORMAL HIGH (ref 70–99)
Potassium: 3.9 mmol/L (ref 3.5–5.1)
Sodium: 141 mmol/L (ref 135–145)
Total Bilirubin: 0.6 mg/dL (ref 0.3–1.2)
Total Protein: 6.2 g/dL — ABNORMAL LOW (ref 6.5–8.1)

## 2021-05-13 LAB — GLUCOSE, CAPILLARY: Glucose-Capillary: 89 mg/dL (ref 70–99)

## 2021-05-13 LAB — CBG MONITORING, ED
Glucose-Capillary: 90 mg/dL (ref 70–99)
Glucose-Capillary: 94 mg/dL (ref 70–99)

## 2021-05-13 MED ORDER — HYDROMORPHONE HCL 1 MG/ML IJ SOLN
1.0000 mg | Freq: Once | INTRAMUSCULAR | Status: AC
Start: 2021-05-13 — End: 2021-05-13
  Administered 2021-05-13: 1 mg via INTRAVENOUS
  Filled 2021-05-13: qty 1

## 2021-05-13 MED ORDER — MORPHINE SULFATE (PF) 2 MG/ML IV SOLN: 2.0000 mg | INTRAVENOUS | Status: DC | PRN

## 2021-05-13 MED ORDER — PROCHLORPERAZINE MALEATE 10 MG PO TABS
10.0000 mg | ORAL_TABLET | Freq: Four times a day (QID) | ORAL | Status: DC | PRN
  Administered 2021-05-14: 10 mg via ORAL
  Filled 2021-05-13: qty 1

## 2021-05-13 MED ORDER — MEMANTINE HCL 10 MG PO TABS
10.0000 mg | ORAL_TABLET | Freq: Two times a day (BID) | ORAL | Status: DC
  Administered 2021-05-14 – 2021-05-17 (×7): 10 mg via ORAL
  Filled 2021-05-13: qty 1
  Filled 2021-05-13: qty 2
  Filled 2021-05-13 (×2): qty 1
  Filled 2021-05-13 (×2): qty 2
  Filled 2021-05-13: qty 1
  Filled 2021-05-13: qty 2

## 2021-05-13 MED ORDER — METRONIDAZOLE 500 MG/100ML IV SOLN
500.0000 mg | Freq: Two times a day (BID) | INTRAVENOUS | Status: DC
  Administered 2021-05-13 – 2021-05-14 (×4): 500 mg via INTRAVENOUS
  Filled 2021-05-13 (×4): qty 100

## 2021-05-13 MED ORDER — FLUTICASONE PROPIONATE 50 MCG/ACT NA SUSP
1.0000 | Freq: Every day | NASAL | Status: DC
  Administered 2021-05-13 – 2021-05-17 (×5): 1 via NASAL
  Filled 2021-05-13: qty 16

## 2021-05-13 MED ORDER — HYDROMORPHONE HCL 1 MG/ML IJ SOLN
1.0000 mg | INTRAMUSCULAR | Status: DC | PRN
  Administered 2021-05-13 – 2021-05-15 (×15): 1 mg via INTRAVENOUS
  Filled 2021-05-13 (×15): qty 1

## 2021-05-13 MED ORDER — OXYCODONE-ACETAMINOPHEN 5-325 MG PO TABS
1.0000 | ORAL_TABLET | ORAL | Status: DC | PRN
  Administered 2021-05-13 – 2021-05-15 (×7): 2 via ORAL
  Administered 2021-05-15: 1 via ORAL
  Administered 2021-05-16 – 2021-05-17 (×8): 2 via ORAL
  Filled 2021-05-13 (×16): qty 2

## 2021-05-13 MED ORDER — HYDRALAZINE HCL 20 MG/ML IJ SOLN
10.0000 mg | Freq: Four times a day (QID) | INTRAMUSCULAR | Status: DC | PRN
Start: 2021-05-13 — End: 2021-05-17

## 2021-05-13 MED ORDER — GABAPENTIN 300 MG PO CAPS
600.0000 mg | ORAL_CAPSULE | Freq: Every day | ORAL | Status: DC
  Administered 2021-05-13 – 2021-05-16 (×4): 600 mg via ORAL
  Filled 2021-05-13 (×4): qty 2

## 2021-05-13 MED ORDER — FOLIC ACID 1 MG PO TABS
1.0000 mg | ORAL_TABLET | Freq: Every day | ORAL | Status: DC
  Administered 2021-05-14 – 2021-05-17 (×4): 1 mg via ORAL
  Filled 2021-05-13 (×4): qty 1

## 2021-05-13 MED ORDER — HYDROMORPHONE HCL 1 MG/ML IJ SOLN
1.0000 mg | INTRAMUSCULAR | Status: DC | PRN
  Administered 2021-05-13 (×3): 1 mg via INTRAVENOUS
  Filled 2021-05-13 (×3): qty 1

## 2021-05-13 MED ORDER — ADULT MULTIVITAMIN W/MINERALS CH
1.0000 | ORAL_TABLET | Freq: Every day | ORAL | Status: DC
  Administered 2021-05-14 – 2021-05-17 (×4): 1 via ORAL
  Filled 2021-05-13 (×4): qty 1

## 2021-05-13 NOTE — ED Notes (Signed)
Pt does report his pain did improve with dilaudid. He was able to move his R hip again.  ?

## 2021-05-13 NOTE — ED Notes (Signed)
RN discussed pt's pain. He states the worst pain is now in his hip. He is worried there are new lesions there. He states that he takes his oxy 10 mg round the clock about 6 per day and never feels much relief. He began to cry and stated he hated coming to the hospital but did not know what else to do. RN advised he discuss this with hospitalist and palliative. He is on no long acting pain med like MS Contin or methadone. He reports he is tired and it is exhausting being in pain ALL of the time. RN provided supportive listening and encouraged him and his wife to advocate for better symptom management.  ?

## 2021-05-13 NOTE — Progress Notes (Signed)
I called and spoke with the patient today.  Unfortunately his current hospitalization for his belly pain coincided with his appointments with Dr. Mickeal Skinner.  I reviewed the recommendations from his MRI scan showing 3 new lesions,  that Dr. Lisbeth Renshaw would like to offer stereotactic radiosurgery in a single fraction.  The patient has had this treatment before and we reviewed risks, benefits short and long-term effects of treatment as well as delivery and logistics.  While he is in the hospital he is interested in proceeding with simulation and we have scheduled him for this tomorrow at 3 PM.  We also discussed that there does appear to be some progressive disease but he is also been away from his systemic therapy since January 2023.  He states that he is not having any significant pain for which he desires anything more than to get back to his systemic treatment.  We will follow him but he is aware of his bone disease and if he has symptom progression, the palliative radiotherapy may be an option if needed. ? ? ? ?Raymond [name], PAC  ?

## 2021-05-13 NOTE — ED Notes (Signed)
Pt reports pain is improving 7/10 now and he is able to have more movement in hip joint.  ?

## 2021-05-13 NOTE — Hospital Course (Addendum)
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  CT showed a 2.6 cm fluid collection around the sigmoid colon.  Started on IV ceftriaxone and IV Flagyl.  General surgery and IR consulted.  IR did not feel additional drain placement is necessary.  Previous drain was removed in its entirety on 5/11.  Patient's abdominal pain improved.  He tolerated soft diet.  Antibiotics de-escalated to IV Unasyn on 5/13.  Discharged on p.o. Augmentin through 05/25/2021. ? ?Patient to follow-up with general surgery and oncology as previously planned.  Transition of care team consulted and provided information on PCP. ?  ?No need was identified after evaluation by therapy. ?

## 2021-05-13 NOTE — Progress Notes (Addendum)
?PROGRESS NOTE ? ? ? ?Raymond [name]  PYP:[id number] DOB: 1960/08/11 DOA: 05/12/2021 ?PCP: Pcp, No  ? ? ?Brief Narrative:  ?Raymond [name] is a 61 y.o. male with history of metastatic lung cancer, hypertension who was recently treated for sigmoid diverticulitis with abscess discharged 10 days back after drain was placed  started having abdominal pain again for the last few days without any nausea, vomiting or diarrhea.  Pain was mostly in the left lower quadrant with subjective fever and chills.  In the ED, labs showed WBC at 13.6.  LFTs with low albumin.  Influenza and COVID was negative.  CT scan of the abdomen pelvis was repeated which showed 2.6 cm fluid collection around the sigmoid colon.  General surgery was consulted from the ED, who recommended IR for draining the collection of abscess.  Patient was started on empiric antibiotic and was admitted hospital for further evaluation and treatment.. ? ?Assessment/Plan ? ?Principal Problem: ?  Diverticulitis of intestine with abscess ?Active Problems: ?  Essential hypertension ?  Malignant neoplasm of upper lobe of left lung ([place]) ?  ?Sigmoid diverticulitis with abscess with intractable pain. ?General surgery was consulted who recommended IR consultation for aspiration of abscess.  IR saw the patient and removed drain tube that was dislodged.  No recommendation for further tube drainage as per IR.  Will  continue IV fluids, IV antibiotic.   Mild leukocytosis noted at 12.5 today.  Temperature max of 99.2 ?F.  Continue Rocephin IV and metronidazole at this time.  Patient complains of severe pain so Dilaudid frequency has been changed to every 2 hourly and added Percocet p.o. every 4 hourly.  Explained about the risk of IV narcotics.  I spoke with Dr. Marlou Starks general surgery who did not recommend any obvious need for surgical intervention and recommended antibiotics and conservative treatment/oncology follow-up. ? ?Metastatic lung cancer with lung nodules liver  metastasis brain metastasis and metastasis.  Follows up with Dr. Julien Nordmann oncology as outpatient.  Has had chemotherapy and radiation treatment.  Denies pulmonary symptoms at this time.  Pain could be related to his metastatic disease as well.  Was seen by palliative care in the last admission and wanted to proceed with aggressive treatment. ? ?Anemia of chronic disease.  Secondary to lung cancer and chemotherapy.  We will continue to monitor CBC while in the hospital.  Latest hemoglobin at 9.9. ? ?History of hypertension on as needed hydralazine on amlodipine as outpatient. ?   ? ? DVT prophylaxis: SCDs Start: 05/12/21 2324 ? ? ?Code Status:   ?  Code Status: Full Code ? ?Disposition: Home ? ?Status is: Inpatient ? ?Remains inpatient appropriate because:   IV antibiotic, IV analgesia. ? ? Family Communication: Communicated with the patient at bedside ? ?Consultants:  ?Interventional radiology ?General surgery-spoke with Dr. Marlou Starks ? ?Procedures:  ?Drain tube removal by IR ? ?Antimicrobials:  ?Rocephin and metronidazole IV 5/10> ? ?Anti-infectives (From admission, onward)  ? ? Start     Dose/Rate Route Frequency Ordered Stop  ? 05/13/21 2200  cefTRIAXone (ROCEPHIN) 2 g in sodium chloride 0.9 % 100 mL IVPB       ? 2 g ?200 mL/hr over 30 Minutes Intravenous Every 24 hours 05/12/21 2324    ? 05/13/21 1200  metroNIDAZOLE (FLAGYL) IVPB 500 mg       ? 500 mg ?100 mL/hr over 60 Minutes Intravenous Every 12 hours 05/13/21 0441    ? 05/12/21 2245  cefTRIAXone (ROCEPHIN) 2 g in sodium chloride  0.9 % 100 mL IVPB       ?See Hyperspace for full Linked Orders Report.  ? 2 g ?200 mL/hr over 30 Minutes Intravenous  Once 05/12/21 2242 05/12/21 2322  ? 05/12/21 2245  metroNIDAZOLE (FLAGYL) IVPB 500 mg       ?See Hyperspace for full Linked Orders Report.  ? 500 mg ?100 mL/hr over 60 Minutes Intravenous  Once 05/12/21 2242 05/13/21 0225  ? ?  ? ? ?Subjective: ?Today, patient was seen and examined at bedside.  Complains of abdominal pain  severe in intensity requiring IV Dilaudid.  Denied any fever nausea vomiting. ? ?Objective: ?Vitals:  ? 05/13/21 1115 05/13/21 1202 05/13/21 1210 05/13/21 1559  ?BP: 136/90 (!) 122/93  136/83  ?Pulse: 78 74  74  ?Resp: 11 20  19   ?Temp:  98.3 ?F (36.8 ?C)  97.9 ?F (36.6 ?C)  ?TempSrc:  Oral  Oral  ?SpO2: 100% 96%  96%  ?Weight:   109.7 kg   ?Height:   5\' 11"  (1.803 m)   ? ? ?Intake/Output Summary (Last 24 hours) at 05/13/2021 1608 ?Last data filed at 05/13/2021 1500 ?Gross per 24 hour  ?Intake 91.33 ml  ?Output 100 ml  ?Net -8.67 ml  ? ?Filed Weights  ? 05/13/21 1210  ?Weight: 109.7 kg  ? ? ?Physical Examination: ? ?General: Morbidly obese built, not in obvious distress ?HENT:   No scleral pallor or icterus noted. Oral mucosa is moist.  ?Chest:  Clear breath sounds.  Diminished breath sounds bilaterally. No crackles or wheezes.  ?CVS: S1 &S2 heard. No murmur.  Regular rate and rhythm. ?Abdomen: Soft, tender over the left lower quadrant, nondistended.  Bowel sounds are heard.  Obese abdomen.  Dressing in place.  Status post drain removal. ?Extremities: No cyanosis, clubbing or edema.  Peripheral pulses are palpable. ?Psych: Alert, awake and oriented, normal mood ?CNS:  No cranial nerve deficits.  Power equal in all extremities.   ?Skin: Warm and dry.  No rashes noted. ? ?Data Reviewed:  ? ?CBC: ?Recent Labs  ?Lab 05/12/21 ?1834 05/13/21 ?0530 05/13/21 ?1141  ?WBC 13.6* 12.5* 10.3  ?HGB 10.8* 9.9* 9.6*  ?HCT 34.0* 31.6* 30.0*  ?MCV 92.6 92.4 94.0  ?PLT 268 229 220  ? ? ?Basic Metabolic Panel: ?Recent Labs  ?Lab 05/12/21 ?1834 05/13/21 ?0530  ?NA 142 141  ?K 4.0 3.9  ?CL 107 106  ?CO2 29 29  ?GLUCOSE 101* 101*  ?BUN 11 9  ?CREATININE 0.86 0.76  ?CALCIUM 9.3 9.2  ? ? ?Liver Function Tests: ?Recent Labs  ?Lab 05/12/21 ?1834 05/13/21 ?0530  ?AST 22 21  ?ALT 11 9  ?ALKPHOS 107 97  ?BILITOT 0.4 0.6  ?PROT 6.8 6.2*  ?ALBUMIN 3.2* 2.9*  ? ? ? ?Radiology Studies: ?DG Chest 2 View ? ?Result Date: 05/12/2021 ?CLINICAL DATA:  Chest  pain, shortness of breath EXAM: CHEST - 2 VIEW COMPARISON:  Previous studies including the chest radiograph done on 01/24/2021 and CT chest done on 04/29/2021 FINDINGS: Cardiac size is within normal limits. There is 5.7 cm homogeneous opacity in the left upper lung fields with small central fiduciary marker which has not changed significantly since 04/29/2021. There are ill-defined faint nodular densities in both lungs suggesting possible pulmonary metastatic disease. No definite new focal infiltrates are seen. There is no pleural effusion or pneumothorax. IMPRESSION: There is 5.7 cm radiopacity in the lateral left upper lung fields suggesting possible malignant neoplastic process. Part of this finding may suggest pneumonia. There are multiple  small faint nodular densities in both lungs suggesting possible pulmonary metastatic disease. There are no signs of new focal infiltrates or pulmonary edema. Electronically Signed   By: Elmer Picker M.D.   On: 05/12/2021 17:42  ? ?CT Angio Chest PE W and/or Wo Contrast ? ?Result Date: 05/12/2021 ?CLINICAL DATA:  Chest pain EXAM: CT ANGIOGRAPHY CHEST CT ABDOMEN AND PELVIS WITH CONTRAST TECHNIQUE: Multidetector CT imaging of the chest was performed using the standard protocol during bolus administration of intravenous contrast. Multiplanar CT image reconstructions and MIPs were obtained to evaluate the vascular anatomy. Multidetector CT imaging of the abdomen and pelvis was performed using the standard protocol during bolus administration of intravenous contrast. RADIATION DOSE REDUCTION: This exam was performed according to the departmental dose-optimization program which includes automated exposure control, adjustment of the mA and/or kV according to patient size and/or use of iterative reconstruction technique. CONTRAST:  180mL OMNIPAQUE IOHEXOL 350 MG/ML SOLN COMPARISON:  Chest x-ray 05/12/2021, CT 04/29/2021, 04/27/2021, 03/18/2021, 12/17/2020 FINDINGS: CTA CHEST  FINDINGS Cardiovascular: Satisfactory opacification of the pulmonary arteries to the segmental level. No evidence of pulmonary embolism. No dissection is seen. Normal cardiac size. No pericardial effusion. [place]

## 2021-05-13 NOTE — Progress Notes (Signed)
Patient ID: Raymond [name], male   DOB: Sep 06, 1960, 61 y.o.   MRN: [id number] ?Pt known to IR service from LLQ diverticular abscess drain placement which was downsized on 04/30/21 in hopes of eventual closing of known fistulous communication with colon. He presents today via ED with retraction of drain and latest CT revealing:  ? ?1. Negative for acute pulmonary embolus. ?2. Grossly stable size large left upper lobe lung mass since CT from ?April. Innumerable pulmonary nodules consistent with metastatic ?disease, probably stable as compared with April 29, 2021, definitely ?progressed from CT chest previous to this. Similar mild mediastinal ?nodes. ?3. Left lower quadrant drainage catheter has been retracted and the ?pigtail is now within the subcutaneous fat of the left abdominal ?wall. Small residual thick rimmed fluid collection adjacent to ?sigmoid colon measuring 2.6 cm. Mild residual soft tissue ?inflammatory process at the sigmoid colon with wall thickening which ?may be inflammatory or neoplastic. ?4. Subtle hypodense liver lesions suspect for metastatic disease ?5. Multiple lucent and sclerotic lesions within the ribs, sternum, ?spine and pelvis consistent with metastatic disease ?6. Gallstones ?7. Nonobstructing left kidney stone ? ?Images were reviewed by Dr. Kathlene Cote. Decision made to remove drain today. No role for additional drain placement at this time. LLQ drain removed in its entirety without immediate complications. Gauze dressing applied to site. Further plans per primary team.  ?

## 2021-05-13 NOTE — Plan of Care (Signed)

## 2021-05-14 ENCOUNTER — Ambulatory Visit
Admission: RE | Admit: 2021-05-14 | Discharge: 2021-05-14 | Disposition: A | Discharge status: Home / Self Care | Payer: Medicaid Other | Source: Ambulatory Visit | Attending: Internal Medicine | Admitting: Internal Medicine

## 2021-05-14 ENCOUNTER — Encounter: Payer: Self-pay | Admitting: General Practice

## 2021-05-14 ENCOUNTER — Encounter: Payer: Self-pay | Admitting: Internal Medicine

## 2021-05-14 DIAGNOSIS — C3412 Malignant neoplasm of upper lobe, left bronchus or lung: Secondary | ICD-10-CM | POA: Diagnosis not present

## 2021-05-14 DIAGNOSIS — I10 Essential (primary) hypertension: Secondary | ICD-10-CM | POA: Diagnosis not present

## 2021-05-14 DIAGNOSIS — K5720 Diverticulitis of large intestine with perforation and abscess without bleeding: Secondary | ICD-10-CM | POA: Diagnosis not present

## 2021-05-14 DIAGNOSIS — C3492 Malignant neoplasm of unspecified part of left bronchus or lung: Secondary | ICD-10-CM | POA: Insufficient documentation

## 2021-05-14 DIAGNOSIS — C7931 Secondary malignant neoplasm of brain: Secondary | ICD-10-CM | POA: Insufficient documentation

## 2021-05-14 LAB — BASIC METABOLIC PANEL
Anion gap: 8 (ref 5–15)
BUN: 11 mg/dL (ref 6–20)
CO2: 28 mmol/L (ref 22–32)
Calcium: 9.4 mg/dL (ref 8.9–10.3)
Chloride: 104 mmol/L (ref 98–111)
Creatinine, Ser: 0.77 mg/dL (ref 0.61–1.24)
GFR, Estimated: >60 mL/min (ref >60)
Glucose, Bld: 102 mg/dL — ABNORMAL HIGH (ref 70–99)
Potassium: 4 mmol/L (ref 3.5–5.1)
Sodium: 140 mmol/L (ref 135–145)

## 2021-05-14 LAB — MAGNESIUM: Magnesium: 1.9 mg/dL (ref 1.7–2.4)

## 2021-05-14 LAB — GLUCOSE, CAPILLARY
Glucose-Capillary: 88 mg/dL (ref 70–99)
Glucose-Capillary: 80 mg/dL (ref 70–99)

## 2021-05-14 MED ORDER — POLYETHYLENE GLYCOL 3350 17 G PO PACK
17.0000 g | PACK | Freq: Every day | ORAL | Status: DC
  Administered 2021-05-14 – 2021-05-17 (×4): 17 g via ORAL
  Filled 2021-05-14 (×4): qty 1

## 2021-05-14 MED ORDER — NUTRISOURCE FIBER PO PACK
1.0000 | PACK | Freq: Every day | ORAL | Status: DC
  Administered 2021-05-14 – 2021-05-17 (×3): 1 via ORAL
  Filled 2021-05-14 (×4): qty 1

## 2021-05-14 MED ORDER — LIDOCAINE 5 % EX PTCH
1.0000 | MEDICATED_PATCH | CUTANEOUS | Status: DC
  Administered 2021-05-14 – 2021-05-17 (×4): 1 via TRANSDERMAL
  Filled 2021-05-14 (×4): qty 1

## 2021-05-14 MED ORDER — ONDANSETRON HCL 4 MG/2ML IJ SOLN
4.0000 mg | Freq: Four times a day (QID) | INTRAMUSCULAR | Status: DC | PRN
  Administered 2021-05-14: 4 mg via INTRAVENOUS
  Filled 2021-05-14: qty 2

## 2021-05-14 NOTE — Progress Notes (Addendum)
?PROGRESS NOTE ? ? ? ?Raymond [name]  FOY:[id number] DOB: 08-May-1960 DOA: 05/12/2021 ?PCP: Pcp, No  ? ? ?Brief Narrative:  ?Raymond [name] is a 61 y.o. male with history of metastatic lung cancer, hypertension who was recently treated for sigmoid diverticulitis with abscess discharged 10 days ago after drain was placed. He started having abdominal pain again for the last few days without any nausea, vomiting or diarrhea.  Pain was mostly in the left lower quadrant with subjective fever and chills.  In the ED, labs showed WBC at 13.6.  LFTs with low albumin.  Influenza and COVID was negative.  CT scan of the abdomen pelvis was repeated which showed 2.6 cm fluid collection around the sigmoid colon.  General surgery was consulted from the ED, who recommended IR for draining the collection of abscess.  Patient was started on empiric antibiotic and was admitted hospital for further evaluation and treatment. Per IR: Images were reviewed by Dr. Kathlene Cote. Decision made to remove drain today. No role for additional drain placement at this time. LLQ drain removed in its entirety without immediate complications. Gauze dressing applied to site. ? ? ?Assessment/Plan ? ?Principal Problem: ?  Diverticulitis of intestine with abscess ?Active Problems: ?  Essential hypertension ?  Malignant neoplasm of upper lobe of left lung ([place]) ? ? ?Sigmoid diverticulitis with abscess with intractable pain. ?-General surgery was consulted who recommended IR consultation for aspiration of abscess.  ?- IR saw the patient and removed drain tube that was dislodged.  No recommendation for further tube drainage as per IR.   ?-continue IV antibiotic.    ?-Dr. Louanne Belton spoke with Dr. Marlou Starks general surgery who did not recommend any obvious need for surgical intervention and recommended antibiotics and conservative treatment/oncology follow-up. ?-PO pain medications started and will attempt to wean off IV pain meds in anticipation of going home ?-bowel  regimen ? ?Metastatic lung cancer with lung nodules liver metastasis brain metastasis and metastasis.  Follows up with Dr. Julien Nordmann oncology as outpatient.  Has had chemotherapy and radiation treatment.  Denies pulmonary symptoms at this time.  Pain could be related to his metastatic disease as well.  Was seen by palliative care in the last admission and wanted to proceed with aggressive treatment. ?-plan for radiation on 5/12 ? ?Anemia of chronic disease.  Secondary to lung cancer and chemotherapy.   ?-trend CBC ? ?History of hypertension on as needed hydralazine on amlodipine as outpatient. ?   ? ?Obesity ?Estimated body mass index is 33.73 kg/m? as calculated from the following: ?  Height as of this encounter: 5\' 11"  (1.803 m). ?  Weight as of this encounter: 109.7 kg.  ? ? ? DVT prophylaxis: SCDs Start: 05/12/21 2324 ? ? ?Code Status:   ?  Code Status: Full Code ? ?Disposition: Home ? ?Status is: Inpatient ? ?Remains inpatient appropriate because:   IV antibiotic, IV analgesia. ? ? ?Consultants:  ?Interventional radiology ?General surgery-spoke with Dr. Marlou Starks ? ?Procedures:  ?Drain tube removal by IR ? ?Antimicrobials:  ?Rocephin and metronidazole IV 5/10> ? ? ? ?Subjective: ?Going for radiation today ?Still with some abdominal "soreness" and pain in hip with ambulation ? ?Objective: ?Vitals:  ? 05/13/21 2113 05/13/21 2134 05/13/21 2353 05/14/21 0551  ?BP: 140/77  131/75 127/73  ?Pulse: 68  70 70  ?Resp: 18  16 16   ?Temp: 98.1 ?F (36.7 ?C)  98.1 ?F (36.7 ?C) 98.2 ?F (36.8 ?C)  ?TempSrc: Oral  Oral Oral  ?SpO2: 98%  96%  95%  ?Weight:  109.7 kg    ?Height:  5\' 11"  (1.803 m)    ? ? ?Intake/Output Summary (Last 24 hours) at 05/14/2021 0835 ?Last data filed at 05/14/2021 0600 ?Gross per 24 hour  ?Intake 291.33 ml  ?Output 450 ml  ?Net -158.67 ml  ? ?Filed Weights  ? 05/13/21 1210 05/13/21 2134  ?Weight: 109.7 kg 109.7 kg  ? ? ?Physical Examination: ? ?General: Appearance:    Obese male in no acute distress  ?   ?Lungs:      respirations unlabored  ?Heart:    Normal heart rate.   ?MS:   All extremities are intact.   ?Neurologic:   Awake, alert  ?  ? ?Data Reviewed:  ? ?CBC: ?Recent Labs  ?Lab 05/12/21 ?1834 05/13/21 ?0530 05/13/21 ?1141 05/13/21 ?1648  ?WBC 13.6* 12.5* 10.3 10.5  ?HGB 10.8* 9.9* 9.6* 10.7*  ?HCT 34.0* 31.6* 30.0* 34.4*  ?MCV 92.6 92.4 94.0 93.0  ?PLT 268 229 220 255  ? ? ?Basic Metabolic Panel: ?Recent Labs  ?Lab 05/12/21 ?1834 05/13/21 ?0530 05/14/21 ?0441  ?NA 142 141 140  ?K 4.0 3.9 4.0  ?CL 107 106 104  ?CO2 29 29 28   ?GLUCOSE 101* 101* 102*  ?BUN 11 9 11   ?CREATININE 0.86 0.76 0.77  ?CALCIUM 9.3 9.2 9.4  ?MG  --   --  1.9  ? ? ?Liver Function Tests: ?Recent Labs  ?Lab 05/12/21 ?1834 05/13/21 ?0530  ?AST 22 21  ?ALT 11 9  ?ALKPHOS 107 97  ?BILITOT 0.4 0.6  ?PROT 6.8 6.2*  ?ALBUMIN 3.2* 2.9*  ? ? ? ?Radiology Studies: ?DG Chest 2 View ? ?Result Date: 05/12/2021 ?CLINICAL DATA:  Chest pain, shortness of breath EXAM: CHEST - 2 VIEW COMPARISON:  Previous studies including the chest radiograph done on 01/24/2021 and CT chest done on 04/29/2021 FINDINGS: Cardiac size is within normal limits. There is 5.7 cm homogeneous opacity in the left upper lung fields with small central fiduciary marker which has not changed significantly since 04/29/2021. There are ill-defined faint nodular densities in both lungs suggesting possible pulmonary metastatic disease. No definite new focal infiltrates are seen. There is no pleural effusion or pneumothorax. IMPRESSION: There is 5.7 cm radiopacity in the lateral left upper lung fields suggesting possible malignant neoplastic process. Part of this finding may suggest pneumonia. There are multiple small faint nodular densities in both lungs suggesting possible pulmonary metastatic disease. There are no signs of new focal infiltrates or pulmonary edema. Electronically Signed   By: Elmer Picker M.D.   On: 05/12/2021 17:42  ? ?CT Angio Chest PE W and/or Wo Contrast ? ?Result Date:  05/12/2021 ?CLINICAL DATA:  Chest pain EXAM: CT ANGIOGRAPHY CHEST CT ABDOMEN AND PELVIS WITH CONTRAST TECHNIQUE: Multidetector CT imaging of the chest was performed using the standard protocol during bolus administration of intravenous contrast. Multiplanar CT image reconstructions and MIPs were obtained to evaluate the vascular anatomy. Multidetector CT imaging of the abdomen and pelvis was performed using the standard protocol during bolus administration of intravenous contrast. RADIATION DOSE REDUCTION: This exam was performed according to the departmental dose-optimization program which includes automated exposure control, adjustment of the mA and/or kV according to patient size and/or use of iterative reconstruction technique. CONTRAST:  129mL OMNIPAQUE IOHEXOL 350 MG/ML SOLN COMPARISON:  Chest x-ray 05/12/2021, CT 04/29/2021, 04/27/2021, 03/18/2021, 12/17/2020 FINDINGS: CTA CHEST FINDINGS Cardiovascular: Satisfactory opacification of the pulmonary arteries to the segmental level. No evidence of pulmonary embolism. No dissection is seen. Normal  cardiac size. No pericardial effusion. Mediastinum/Nodes: Midline trachea. No thyroid mass. Enlarged prevascular node measuring 10 mm as before. Left paratracheal node measures 13 mm. Esophagus within normal limits. Lungs/Pleura: No pleural effusion or pneumothorax. Innumerable pulmonary nodules consistent with metastatic disease. Irregular left upper lobe lung mass measures approximately 6.1 by 4.5 cm, previously 6 x 4.5 cm with central marker. Musculoskeletal: Sternum is intact. No fracture. Skeletal metastatic disease with multiple lucent and mildly sclerotic lesions involving the bilateral ribs and spine as well as the sternum. Review of the MIP images confirms the above findings. CT ABDOMEN and PELVIS FINDINGS Hepatobiliary: Subtle hypodense liver lesions suspicious for metastatic disease. Gallstones. No biliary dilatation Pancreas: Unremarkable. No pancreatic ductal  dilatation or surrounding inflammatory changes. Spleen: Normal in size without focal abnormality. Adrenals/Urinary Tract: Adrenal glands are normal. Punctate stone left kidney. No hydronephrosis. The bladder

## 2021-05-14 NOTE — Progress Notes (Signed)
[place] Spiritual Care Note ? ?Followed up with Raymond [name] in person while he is inpatient at [facility]. He reports feeling significantly better and more mobile with the removal of the drain. Personal agency and independence are very important to him, so one of his goals is to keep moving as much as possible. Another goal is to experience enough pain management that he can "get back to chemo as soon as possible."  ? ?Raymond [name] reports not quite hearing or understanding results of his recent scans. He is looking at his disease as an opportunity to "claim God's victory" of physical healing and to "trample on the serpent [of cancer]." ? ?Key to Raymond [name]'s physical goals is his lived theology that "God's got me" through everything he experiences. 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 "Standing on God's promises" is a key value, both metaphorically and literally. ? ?[name] is available M-F to follow for spiritual and emotional support. [facility] on-call chaplains are of course available during weekend hours as needed to process urgent needs, such as updates and changes.  ? ?Chaplain Lorrin Jackson, MDiv, [facility] ?[place] Pager: [phone] ?Weekend/overnight [facility] chaplain pager: [phone] ? ?

## 2021-05-15 DIAGNOSIS — F419 Anxiety disorder, unspecified: Secondary | ICD-10-CM | POA: Diagnosis not present

## 2021-05-15 DIAGNOSIS — D649 Anemia, unspecified: Secondary | ICD-10-CM

## 2021-05-15 DIAGNOSIS — N400 Enlarged prostate without lower urinary tract symptoms: Secondary | ICD-10-CM | POA: Diagnosis not present

## 2021-05-15 DIAGNOSIS — C3492 Malignant neoplasm of unspecified part of left bronchus or lung: Secondary | ICD-10-CM | POA: Diagnosis not present

## 2021-05-15 DIAGNOSIS — E6609 Other obesity due to excess calories: Secondary | ICD-10-CM

## 2021-05-15 DIAGNOSIS — Z6833 Body mass index (BMI) 33.0-33.9, adult: Secondary | ICD-10-CM

## 2021-05-15 DIAGNOSIS — K5720 Diverticulitis of large intestine with perforation and abscess without bleeding: Secondary | ICD-10-CM | POA: Diagnosis not present

## 2021-05-15 MED ORDER — HYDROMORPHONE HCL 1 MG/ML IJ SOLN: 0.5000 mg | INTRAMUSCULAR | Status: DC | PRN

## 2021-05-15 MED ORDER — VITAMIN B-12 1000 MCG PO TABS
1000.0000 ug | ORAL_TABLET | Freq: Every day | ORAL | Status: DC
  Administered 2021-05-15 – 2021-05-17 (×3): 1000 ug via ORAL
  Filled 2021-05-15 (×3): qty 1

## 2021-05-15 MED ORDER — SODIUM CHLORIDE 0.9 % IV SOLN
3.0000 g | Freq: Four times a day (QID) | INTRAVENOUS | Status: DC
  Administered 2021-05-15 – 2021-05-17 (×9): 3 g via INTRAVENOUS
  Filled 2021-05-15 (×11): qty 8

## 2021-05-15 MED ORDER — MIRTAZAPINE 15 MG PO TABS
30.0000 mg | ORAL_TABLET | Freq: Every day | ORAL | Status: DC
  Administered 2021-05-15 – 2021-05-16 (×2): 30 mg via ORAL
  Filled 2021-05-15 (×2): qty 2

## 2021-05-15 NOTE — Assessment & Plan Note (Addendum)
Normotensive.  Not taking amlodipine at home. ?

## 2021-05-15 NOTE — Plan of Care (Signed)
  Problem: Pain Managment: Goal: General experience of comfort will improve Outcome: Progressing   

## 2021-05-15 NOTE — Assessment & Plan Note (Addendum)
Improved.  Discharged on p.o. Augmentin until 05/25/2021. ?

## 2021-05-15 NOTE — Assessment & Plan Note (Addendum)
Does not seem to be on any medication.  Does not seem to be symptomatic ?

## 2021-05-15 NOTE — Assessment & Plan Note (Addendum)
H&H relatively stable.  Recheck CBC at follow-up ?

## 2021-05-15 NOTE — Evaluation (Signed)
Physical Therapy Evaluation ?Patient Details ?Name: Raymond [name] ?MRN: [id number] ?DOB: 02-12-1960 ?Today's Date: 05/15/2021 ? ?History of Present Illness ? KEYAN FOLSON is a 61 y.o. male with history of metastatic lung cancer, hypertension who was recently treated for sigmoid diverticulitis with abscess discharged 10 days ago after drain was placed. He started having abdominal pain again for the last few days without any nausea, vomiting or diarrhea.  Pain was mostly in the left lower quadrant with subjective fever and chills.  ?Clinical Impression ? Pt admitted with above diagnosis.  Pt currently with functional limitations due to the deficits listed below (see PT Problem List). Pt will benefit from skilled PT to increase their independence and safety with mobility to allow discharge to the venue listed below.  Pt was excited to learn the proper way to use his quad cane and adjusted to his height.  Although he did not perform the stairs, he was educated on proper sequencing and use of rail and quad cane.  He will have the support of his wife at home and will follow acutely, but do not feel he will need any PT after d/c. ?   ?   ? ?Recommendations for follow up therapy are one component of a multi-disciplinary discharge planning process, led by the attending physician.  Recommendations may be updated based on patient status, additional functional criteria and insurance authorization. ? ?Follow Up Recommendations No PT follow up ? ?  ?Assistance Recommended at Discharge Intermittent Supervision/Assistance  ?Patient can return home with the following ? A little help with walking and/or transfers;A little help with bathing/dressing/bathroom ? ?  ?Equipment Recommendations None recommended by PT  ?Recommendations for Other Services ?    ?  ?Functional Status Assessment Patient has had a recent decline in their functional status and demonstrates the ability to make significant improvements in function in a reasonable  and predictable amount of time.  ? ?  ?Precautions / Restrictions Precautions ?Precautions: Fall ?Restrictions ?Weight Bearing Restrictions: No  ? ?  ? ?Mobility ? Bed Mobility ?Overal bed mobility: Modified Independent ?  ?  ?  ?  ?  ?  ?General bed mobility comments: HOB elevated, but no difficulty getting to EOB. ?  ? ?Transfers ?Overall transfer level: Modified independent ?Equipment used: Quad cane ?  ?Sit to Stand: Modified independent (Device/Increase time) ?  ?  ?  ?  ?  ?General transfer comment: no LOB or swaying upon standing ?  ? ?Ambulation/Gait ?Ambulation/Gait assistance: Min guard ?Gait Distance (Feet): 500 Feet ?Assistive device: Quad cane ?Gait Pattern/deviations: Decreased step length - right, Decreased step length - left, Trunk flexed ?  ?  ?  ?General Gait Details: Instruction on proper hand placement for quad cane and adjusted for height.  He did not need any other external support.  Cues to look up and not at the ground. ? ?Stairs ?  ?  ?  ?  ?  ? ?Wheelchair Mobility ?  ? ?Modified Rankin (Stroke Patients Only) ?  ? ?  ? ?Balance Overall balance assessment: Mild deficits observed, not formally tested ?  ?  ?  ?  ?  ?  ?  ?  ?  ?  ?  ?  ?  ?  ?  ?  ?  ?  ?   ? ? ? ?Pertinent Vitals/Pain Pain Assessment ?Pain Assessment: 0-10 ?Pain Score: 7  ?Pain Location: L side ?Pain Descriptors / Indicators: Constant, Aching ?Pain Intervention(s): Monitored during session,  Limited activity within patient's tolerance  ? ? ?Home Living Family/patient expects to be discharged to:: Private residence ?Living Arrangements: Spouse/significant other ?Available Help at Discharge: Family ?Type of Home: Apartment ?Home Access: Stairs to enter ?Entrance Stairs-Rails: Right;Left ?Entrance Stairs-Number of Steps: 8 ?  ?Home Layout: One level ?Home Equipment: Kasandra Knudsen - single point;Shower seat;Rollator (4 wheels) ?Additional Comments: pt's wife is assisting him  ?  ?Prior Function Prior Level of Function :  Independent/Modified Independent ?  ?  ?  ?  ?  ?  ?Mobility Comments: Uses rollator at times, but mainly cane in the apartment because of space restrictions.  Uses walls to help ?ADLs Comments: pt reports wife is helping more with bathing/dressing with LB  as needed       based on fatigue ?  ? ? ?Hand Dominance  ? Dominant Hand: Left ? ?  ?Extremity/Trunk Assessment  ? Upper Extremity Assessment ?Upper Extremity Assessment: Overall WFL for tasks assessed ?  ? ?Lower Extremity Assessment ?Lower Extremity Assessment: Overall WFL for tasks assessed ?  ? ?Cervical / Trunk Assessment ?Cervical / Trunk Assessment: Normal  ?Communication  ? Communication: No difficulties  ?Cognition Arousal/Alertness: Awake/alert ?Behavior During Therapy: [facility] for tasks assessed/performed ?Overall Cognitive Status: Within Functional Limits for tasks assessed ?  ?  ?  ?  ?  ?  ?  ?  ?  ?  ?  ?  ?  ?  ?  ?  ?  ?  ?  ? ?  ?General Comments   ? ?  ?Exercises    ? ?Assessment/Plan  ?  ?PT Assessment Patient needs continued PT services  ?PT Problem List Decreased strength;Decreased activity tolerance;Decreased balance;Decreased mobility;Decreased knowledge of use of DME;Decreased safety awareness;Decreased knowledge of precautions ? ?   ?  ?PT Treatment Interventions DME instruction;Stair training;Gait training;Functional mobility training;Therapeutic activities;Therapeutic exercise;Balance training;Neuromuscular re-education;Patient/family education   ? ?PT Goals (Current goals can be found in the Care Plan section)  ?Acute Rehab PT Goals ?Patient Stated Goal: Start chemo again next week ?PT Goal Formulation: With patient ?Time For Goal Achievement: 05/29/21 ?Potential to Achieve Goals: Good ? ?  ?Frequency Min 3X/week ?  ? ? ?Co-evaluation   ?  ?  ?  ?  ? ? ?  ?AM-PAC PT "6 Clicks" Mobility  ?Outcome Measure Help needed turning from your back to your side while in a flat bed without using bedrails?: None ?Help needed moving from lying on your  back to sitting on the side of a flat bed without using bedrails?: None ?Help needed moving to and from a bed to a chair (including a wheelchair)?: None ?Help needed standing up from a chair using your arms (e.g., wheelchair or bedside chair)?: None ?Help needed to walk in hospital room?: A Little ?Help needed climbing 3-5 steps with a railing? : A Little ?6 Click Score: 22 ? ?  ?End of Session Equipment Utilized During Treatment: Gait belt ?Activity Tolerance: Patient tolerated treatment well ?Patient left: in chair;with call bell/phone within reach ?  ?PT Visit Diagnosis: Unsteadiness on feet (R26.81);History of falling (Z91.81);Difficulty in walking, not elsewhere classified (R26.2) ?  ? ?Time: 3532-9924 ?PT Time Calculation (min) (ACUTE ONLY): 20 min ? ? ?Charges:   PT Evaluation ?$PT Eval Low Complexity: 1 Low ?  ?  ?   ? ? ?Santiago Glad L. Tamala Julian, PT ? ?05/15/2021 ? ? ?Galen Manila ?05/15/2021, 2:17 PM ? ?

## 2021-05-15 NOTE — Assessment & Plan Note (Signed)
Body mass index is 33.73 kg/m?Marland Kitchen ? ?

## 2021-05-15 NOTE — Assessment & Plan Note (Signed)
Partly contributing to his symptoms. ?-Continue home medication ?

## 2021-05-15 NOTE — Progress Notes (Signed)
?PROGRESS NOTE ? ?Raymond [name] FIE:[id number] DOB: 1960/03/16  ? ?PCP: Pcp, No ? ?Patient is from: Home ? ?DOA: 05/12/2021 LOS: 3 ? ?Chief complaints ?Chief Complaint  ?Patient presents with  ? Chest Pain  ? Abdominal Pain  ?  ? 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  CT showed a 2.6 cm fluid collection around the sigmoid colon.  General surgery and IR consulted.  IR did not feel additional drain placement is necessary.  Previous drain was removed in its entirety on 5/11.  Diet advanced to full liquid.  Pain seems to be improving.  ? ?Likely discharge home on 5/14 if pain is fairly controlled and he tolerates soft diet. ?   ? ?Subjective: ?Seen and examined earlier this morning.  No major events overnight of this morning.  Continues to endorse pain in his "left hip".  He also reports pain all over his abdomen all the way to his chest.  He describes the pain as "nagging and full of gas".  He rates his pain 7/10. ? ?Objective: ?Vitals:  ? 05/14/21 1223 05/14/21 2234 05/15/21 0447 05/15/21 1302  ?BP: 133/90 132/78 119/66 128/88  ?Pulse: 75 86 72 70  ?Resp: 18 18 18 18   ?Temp: 98.6 ?F (37 ?C) 98.7 ?F (37.1 ?C) 98 ?F (36.7 ?C) 97.7 ?F (36.5 ?C)  ?TempSrc: Oral Oral Oral Oral  ?SpO2: 92% 92% 97% 96%  ?Weight:      ?Height:      ? ? ?Examination: ? ?GENERAL: No apparent distress.  Nontoxic. ?HEENT: MMM.  Vision and hearing grossly intact.  ?NECK: Supple.  No apparent JVD.  ?RESP:  No IWOB.  Fair aeration bilaterally. ?CVS:  RRR. Heart sounds normal.  ?ABD/GI/GU: BS+. Abd soft.  LLQ  tenderness. ?MSK/EXT:  Moves extremities.  FROM in left hip without distress. ?SKIN: no apparent skin lesion or wound ?NEURO: Awake, alert and oriented appropriately.  No apparent focal neuro deficit. ?PSYCH: Calm. Normal affect.  ? ?Procedures:  ?None ? ?Microbiology summarized: ?COVID-19 and influenza PCR nonreactive. ? ?Assessment and Plan: ?* Sigmoid diverticulitis with abscess ?S/p CT-guided drain placement on 03/04/2021.  Had 2 rounds of antibiotics in the last 2 months.  Returns with LLQ pain, fever and chills.  CT with 2.6 cm fluid collection around sigmoid colon.  Reviewed by general surgery and IR.  Drain removed on 5/11.  Continues to endorse LLQ pain that he describes as nagging and being full of gas. ?-Changes CTX and Flagyl to IV Unasyn ?-Advised to cut down on IV Dilaudid and using the p.o. pain medication. ?-Reduced IV Dilaudid to 0.5 mg every 3 hours as needed ?-Continue home gabapentin at night ?-Encouraged ambulation at least 4 times a day ?-Continue full liquid diet today.  Will advance to soft diet in the morning ? 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 S/p whole brain radiation and chemotherapy.  Followed by Dr. Julien Nordmann.  Evaluated by palliative care last admission and wanted to proceed with aggressive treatment.  ?-No longer on Decadron. ?-Underwent simulation on 5/12.  Since radiation will start on 5/18 ?-Per radiation  oncology. ? ?Normocytic anemia ?Recent Labs  ?  04/19/21 ?1349 04/27/21 ?1313 04/27/21 ?2215 04/28/21 ?0500 04/30/21 ?1517 05/02/21 ?6160 05/12/21 ?1834 05/13/21 ?0530 05/13/21 ?1141 05/13/21 ?1648  ?HGB 10.4* 10.8* 11.7* 10.5* 10.1* 9.8* 10.8* 9.9* 9.6* 10.7*  ?H&H stable.  Continue monitoring ? ? ?Essential hypertension ?Normotensive.  Not taking amlodipine at home. ?-Change IV hydralazine to p.o. as needed ? ?BPH (benign prostatic hyperplasia) ?Does not seem to be on any medication.  Does not seem to be symptomatic ? ?Anxiety ?Partly  contributing to his symptoms. ?-Continue home medication ? ?Obesity, unspecified ?Body mass index is 33.73 kg/m?. ? ? ? ?DVT prophylaxis:  ?SCDs Start: 05/12/21 2324 ? ?Code Status: Full code ?Family Communication: None at bedside. ?Level of care: Telemetry ?Status is: Inpatient ?Remains inpatient appropriate because: Sigmoid diverticulitis with abscess requiring IV antibiotics and IV pain medications. ? ? ?Final disposition: Likely home in the next 24 to 48 hours ?Consultants:  ?General surgery ?Interventional radiology ?Radiation oncology ? ?Sch Meds:  ?Scheduled Meds: ? fiber  1 packet Oral Q0600  ? fluticasone  1 spray Each Nare Daily  ? folic acid  1 mg Oral Daily  ? gabapentin  600 mg Oral QHS  ? lidocaine  1 patch Transdermal Q24H  ? memantine  10 mg Oral BID  ? mirtazapine  30 mg Oral QHS  ? multivitamin with minerals  1 tablet Oral Daily  ? polyethylene glycol  17 g Oral Daily  ? vitamin B-12  1,000 mcg Oral Daily  ? ?Continuous Infusions: ? ampicillin-sulbactam (UNASYN) IV 3 g (05/15/21 1355)  ? ?PRN Meds:.hydrALAZINE, HYDROmorphone (DILAUDID) injection, ondansetron (ZOFRAN) IV, oxyCODONE-acetaminophen, prochlorperazine ? ?Antimicrobials: ?Anti-infectives (From admission, onward)  ? ? Start     Dose/Rate Route Frequency Ordered Stop  ? 05/15/21 0800  Ampicillin-Sulbactam (UNASYN) 3 g in sodium chloride 0.9 % 100 mL IVPB       ? 3 g ?200 mL/hr over 30 Minutes Intravenous Every 6 hours 05/15/21 0717 05/23/21 0759  ? 05/13/21 2200  cefTRIAXone (ROCEPHIN) 2 g in sodium chloride 0.9 % 100 mL IVPB  Status:  Discontinued       ? 2 g ?200 mL/hr over 30 Minutes Intravenous Every 24 hours 05/12/21 2324 05/15/21 0717  ? 05/13/21 1200  metroNIDAZOLE (FLAGYL) IVPB 500 mg  Status:  Discontinued       ? 500 mg ?100 mL/hr over 60 Minutes Intravenous Every 12 hours 05/13/21 0441 05/15/21 0717  ? 05/12/21 2245  cefTRIAXone (ROCEPHIN) 2 g in sodium chloride 0.9 % 100 mL IVPB       ?See Hyperspace for full Linked Orders  Report.  ? 2 g ?200 mL/hr over 30 Minutes Intravenous  Once 05/12/21 2242 05/12/21 2322  ? 05/12/21 2245  metroNIDAZOLE (FLAGYL) IVPB 500 mg       ?See Hyperspace for full Linked Orders Report.  ? 500 mg ?100 mL/hr over 60 Minutes Intravenous  Once 05/12/21 2242 05/13/21 0225  ? ?  ? ? ? ?I have personally reviewed the following labs and images: ?CBC: ?Recent Labs  ?Lab 05/12/21 ?1834 05/13/21 ?0530 05/13/21 ?1141 05/13/21 ?1648  ?WBC 13.6* 12.5* 10.3 10.5  ?HGB 10.8* 9.9* 9.6* 10.7*  ?HCT 34.0* 31.6* 30.0* 34.4*  ?MCV 92.6 92.4 94.0 93.0  ?PLT 268 229 220 255  ? ?BMP &GFR ?Recent Labs  ?Lab 05/12/21 ?1834 05/13/21 ?0530 05/14/21 ?0441  ?NA 142 141 140  ?K 4.0 3.9 4.0  ?CL 107 106 104  ?CO2 29 29 28   ?  GLUCOSE 101* 101* 102*  ?BUN 11 9 11   ?CREATININE 0.86 0.76 0.77  ?CALCIUM 9.3 9.2 9.4  ?MG  --   --  1.9  ? ?Estimated Creatinine Clearance: 126.8 mL/min (by C-G formula based on SCr of 0.77 mg/dL). ?Liver & Pancreas: ?Recent Labs  ?Lab 05/12/21 ?1834 05/13/21 ?0530  ?AST 22 21  ?ALT 11 9  ?ALKPHOS 107 97  ?BILITOT 0.4 0.6  ?PROT 6.8 6.2*  ?ALBUMIN 3.2* 2.9*  ? ?No results for input(s): LIPASE, AMYLASE in the last 168 hours. ?No results for input(s): AMMONIA in the last 168 hours. ?Diabetic: ?No results for input(s): HGBA1C in the last 72 hours. ?Recent Labs  ?Lab 05/13/21 ?0009 05/13/21 ?0801 05/13/21 ?1600 05/14/21 ?6606 05/14/21 ?0751  ?GLUCAP 94 90 89 88 80  ? ?Cardiac Enzymes: ?No results for input(s): CKTOTAL, CKMB, CKMBINDEX, TROPONINI in the last 168 hours. ?No results for input(s): PROBNP in the last 8760 hours. ?Coagulation Profile: ?No results for input(s): INR, PROTIME in the last 168 hours. ?Thyroid Function Tests: ?No results for input(s): TSH, T4TOTAL, FREET4, T3FREE, THYROIDAB in the last 72 hours. ?Lipid Profile: ?No results for input(s): CHOL, HDL, LDLCALC, TRIG, CHOLHDL, LDLDIRECT in the last 72 hours. ?Anemia Panel: ?No results for input(s): VITAMINB12, FOLATE, FERRITIN, TIBC, IRON, RETICCTPCT in  the last 72 hours. ?Urine analysis: ?   ?Component Value Date/Time  ? COLORURINE AMBER (A) 04/28/2021 0000  ? APPEARANCEUR CLEAR 04/28/2021 0000  ? LABSPEC >1.046 (H) 04/28/2021 0000  ? PHURINE 5.0 04/28/2021 0000  ?

## 2021-05-15 NOTE — Assessment & Plan Note (Addendum)
With metastasis to brain and liver. S/p whole brain radiation and chemotherapy.  Followed by Dr. Julien Nordmann.  Evaluated by palliative care last admission and wanted to proceed with aggressive treatment.  No longer on Decadron. Underwent simulation on 5/12.  Since radiation will start on 5/18 ?-Outpatient follow-up with radiation oncology. ?

## 2021-05-16 DIAGNOSIS — F419 Anxiety disorder, unspecified: Secondary | ICD-10-CM | POA: Diagnosis not present

## 2021-05-16 DIAGNOSIS — C3492 Malignant neoplasm of unspecified part of left bronchus or lung: Secondary | ICD-10-CM | POA: Diagnosis not present

## 2021-05-16 DIAGNOSIS — N400 Enlarged prostate without lower urinary tract symptoms: Secondary | ICD-10-CM | POA: Diagnosis not present

## 2021-05-16 DIAGNOSIS — K5720 Diverticulitis of large intestine with perforation and abscess without bleeding: Secondary | ICD-10-CM | POA: Diagnosis not present

## 2021-05-16 LAB — CBC
HCT: 30.7 % — ABNORMAL LOW (ref 39.0–52.0)
Hemoglobin: 9.7 g/dL — ABNORMAL LOW (ref 13.0–17.0)
MCH: 29.1 pg (ref 26.0–34.0)
MCHC: 31.6 g/dL (ref 30.0–36.0)
MCV: 92.2 fL (ref 80.0–100.0)
Platelets: 240 10*3/uL (ref 150–400)
RBC: 3.33 MIL/uL — ABNORMAL LOW (ref 4.22–5.81)
RDW: 14.7 % (ref 11.5–15.5)
WBC: 9.6 10*3/uL (ref 4.0–10.5)
nRBC: 0 % (ref 0.0–0.2)

## 2021-05-16 LAB — RENAL FUNCTION PANEL
Albumin: 2.8 g/dL — ABNORMAL LOW (ref 3.5–5.0)
Anion gap: 9 (ref 5–15)
BUN: 9 mg/dL (ref 6–20)
CO2: 29 mmol/L (ref 22–32)
Calcium: 9.4 mg/dL (ref 8.9–10.3)
Chloride: 106 mmol/L (ref 98–111)
Creatinine, Ser: 0.72 mg/dL (ref 0.61–1.24)
GFR, Estimated: >60 mL/min (ref >60)
Glucose, Bld: 89 mg/dL (ref 70–99)
Phosphorus: 3.9 mg/dL (ref 2.5–4.6)
Potassium: 3.7 mmol/L (ref 3.5–5.1)
Sodium: 144 mmol/L (ref 135–145)

## 2021-05-16 LAB — MAGNESIUM: Magnesium: 2 mg/dL (ref 1.7–2.4)

## 2021-05-16 MED ORDER — HYDROMORPHONE HCL 1 MG/ML IJ SOLN
0.5000 mg | INTRAMUSCULAR | Status: DC | PRN
  Administered 2021-05-16 – 2021-05-17 (×3): 0.5 mg via INTRAVENOUS
  Filled 2021-05-16 (×3): qty 0.5

## 2021-05-16 NOTE — Progress Notes (Signed)
?PROGRESS NOTE ? ?Raymond [name] NLZ:[id number] DOB: [birth year]  ? ?PCP: Pcp, No ? ?Patient is from: Home ? ?DOA: 05/12/2021 LOS: 4 ? ?Chief complaints ?Chief Complaint  ?Patient presents with  ? Chest Pain  ? Abdominal Pain  ?  ? 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  CT showed a 2.6 cm fluid collection around the sigmoid colon.  General surgery and IR consulted.  IR did not feel additional drain placement is necessary.  Previous drain was removed in its entirety on 5/11.  Diet advanced to full liquid.  Pain seems to be improving.  ? ?Likely discharge home on 5/15 if pain is fairly controlled and he tolerates soft diet. ?   ? ?Subjective: ?Seen and examined earlier this morning.  No major events overnight of this morning.  Continues to endorse LLQ pain.  He rates his pain 6/10 which is better than yesterday.  He says his pain is better when he gets up and move.  Tolerating full liquid diet.  He likes to try soft diet today. ? ?Objective: ?Vitals:  ? 05/15/21 1302 05/15/21 1935 05/16/21 0308 05/16/21 1243  ?BP: 128/88 125/72 118/73 137/76  ?Pulse: 70 70  77  ?Resp: 18 16 18 19   ?Temp: 97.7 ?F (36.5 ?C) 98.3 ?F (36.8 ?C) 98.1 ?F (36.7 ?C) 98.1 ?F (36.7 ?C)  ?TempSrc: Oral Oral Oral Oral  ?SpO2: 96% 95% 94% 94%  ?Weight:      ?Height:      ? ? ?Examination: ? ?GENERAL: No apparent distress.  Nontoxic. ?HEENT: MMM.  Vision and hearing grossly intact.  ?NECK: Supple.  No apparent JVD.  ?RESP:  No IWOB.  Fair aeration bilaterally. ?CVS:  RRR. Heart sounds normal.  ?ABD/GI/GU: BS+. Abd soft.   Mild discomfort over LLQ. ?MSK/EXT:  Moves extremities. No apparent deformity. No edema.  ?SKIN: no apparent skin lesion or wound ?NEURO: Awake and alert. Oriented appropriately.  No apparent focal neuro deficit. ?PSYCH: Calm. Normal affect. ? ?Procedures:  ?None ? ?Microbiology summarized: ?COVID-19 and influenza PCR nonreactive. ? ?Assessment and Plan: ?* Sigmoid diverticulitis with abscess ?S/p CT-guided drain placement on 03/04/2021.  Had 2 rounds of antibiotics in the last 2 months.  Returns with LLQ pain, fever and chills.  CT with 2.6 cm fluid collection around sigmoid colon.  Reviewed by general surgery and IR.  Drain removed on 5/11.  Continues to endorse LLQ pain that he describes as nagging and being full of gas. ?-CTX and Flagyl 5/10-5/13>> IV Unasyn 5/14>>> ?-Continue p.o. Percocet for moderate to severe pain ?-Decreased IV Dilaudid to 0.5 mg every 4 hours as needed for breakthrough pain ?-Continue home gabapentin at night ?-Encouraged ambulation at least 4 times a day ?-Advanced to soft diet ?-Likely discharge home on 5/15 ? 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 S/p whole brain radiation and chemotherapy.  Followed by Dr. Julien Nordmann.  Evaluated by palliative care last admission and wanted to proceed with aggressive treatment.  ?-No longer on Decadron. ?-Underwent simulation on 5/12.  Since radiation will start on 5/18 ?-Per radiation oncology. ? ?Normocytic anemia ?Recent  Labs  ?  04/27/21 ?1313 04/27/21 ?2215 04/28/21 ?0500 04/30/21 ?8938 05/02/21 ?1017 05/12/21 ?1834 05/13/21 ?0530 05/13/21 ?1141 05/13/21 ?1648 05/16/21 ?5102  ?HGB 10.8* 11.7* 10.5* 10.1* 9.8* 10.8* 9.9* 9.6* 10.7* 9.7*  ?H&H stable.  Continue monitoring ? ? ?Essential hypertension ?Normotensive.  Not taking amlodipine at home. ?-Change IV hydralazine to p.o. as needed ? ?BPH (benign prostatic hyperplasia) ?Does not seem to be on any medication.  Does not seem to be symptomatic ? ?Anxiety ?Partly  contributing to his symptoms. ?-Continue home medication ? ?Obesity, unspecified ?Body mass index is 33.73 kg/m?. ? ? ? ?DVT prophylaxis:  ?SCDs Start: 05/12/21 2324 ? ?Code Status: Full code ?Family Communication: None at bedside. ?Level of care: Telemetry ?Status is: Inpatient ?Remains inpatient appropriate because: Sigmoid diverticulitis with abscess requiring IV antibiotics and IV pain medications. ? ? ?Final disposition: Likely home in the next 24 hours. ?Consultants:  ?General surgery ?Interventional radiology ?Radiation oncology ? ?Sch Meds:  ?Scheduled Meds: ? fiber  1 packet Oral Q0600  ? fluticasone  1 spray Each Nare Daily  ? folic acid  1 mg Oral Daily  ? gabapentin  600 mg Oral QHS  ? lidocaine  1 patch Transdermal Q24H  ? memantine  10 mg Oral BID  ? mirtazapine  30 mg Oral QHS  ? multivitamin with minerals  1 tablet Oral Daily  ? polyethylene glycol  17 g Oral Daily  ? vitamin B-12  1,000 mcg Oral Daily  ? ?Continuous Infusions: ? ampicillin-sulbactam (UNASYN) IV 3 g (05/16/21 0819)  ? ?PRN Meds:.hydrALAZINE, HYDROmorphone (DILAUDID) injection, ondansetron (ZOFRAN) IV, oxyCODONE-acetaminophen, prochlorperazine ? ?Antimicrobials: ?Anti-infectives (From admission, onward)  ? ? Start     Dose/Rate Route Frequency Ordered Stop  ? 05/15/21 0800  Ampicillin-Sulbactam (UNASYN) 3 g in sodium chloride 0.9 % 100 mL IVPB       ? 3 g ?200 mL/hr over 30 Minutes Intravenous Every 6 hours 05/15/21 0717 05/23/21 0759  ? 05/13/21 2200  cefTRIAXone (ROCEPHIN) 2 g in sodium chloride 0.9 % 100 mL IVPB  Status:  Discontinued       ? 2 g ?200 mL/hr over 30 Minutes Intravenous Every 24 hours 05/12/21 2324 05/15/21 0717  ? 05/13/21 1200  metroNIDAZOLE (FLAGYL) IVPB 500 mg  Status:  Discontinued       ? 500 mg ?100 mL/hr over 60 Minutes Intravenous Every 12 hours 05/13/21 0441 05/15/21 0717  ? 05/12/21 2245  cefTRIAXone (ROCEPHIN) 2 g in sodium chloride 0.9 % 100 mL IVPB       ?See Hyperspace for full Linked Orders Report.  ?  2 g ?200 mL/hr over 30 Minutes Intravenous  Once 05/12/21 2242 05/12/21 2322  ? 05/12/21 2245  metroNIDAZOLE (FLAGYL) IVPB 500 mg       ?See Hyperspace for full Linked Orders Report.  ? 500 mg ?100 mL/hr over 60 Minutes Intravenous  Once 05/12/21 2242 05/13/21 0225  ? ?  ? ? ? ?I have personally reviewed the following labs and images: ?CBC: ?Recent Labs  ?Lab 05/12/21 ?1834 05/13/21 ?0530 05/13/21 ?1141 05/13/21 ?1648 05/16/21 ?5852  ?WBC 13.6* 12.5* 10.3 10.5 9.6  ?HGB 10.8* 9.9* 9.6* 10.7* 9.7*  ?HCT 34.0* 31.6* 30.0* 34.4* 30.7*  ?MCV 92.6 92.4 94.0 93.0 92.2  ?PLT 268 229 220 255 240  ? ?BMP &GFR ?Recent Labs  ?Lab 05/12/21 ?1834 05/13/21 ?0530 05/14/21 ?0441 05/16/21 ?7782  ?NA 142 141 140 144  ?K 4.0 3.9 4.0 3.7  ?CL 107 106 104 106  ?  CO2 29 29 28 29   ?GLUCOSE 101* 101* 102* 89  ?BUN 11 9 11 9   ?CREATININE 0.86 0.76 0.77 0.72  ?CALCIUM 9.3 9.2 9.4 9.4  ?MG  --   --  1.9 2.0  ?PHOS  --   --   --  3.9  ? ?Estimated Creatinine Clearance: 126.8 mL/min (by C-G formula based on SCr of 0.72 mg/dL). ?Liver & Pancreas: ?Recent Labs  ?Lab 05/12/21 ?1834 05/13/21 ?0530 05/16/21 ?1505  ?AST 22 21  --   ?ALT 11 9  --   ?ALKPHOS 107 97  --   ?BILITOT 0.4 0.6  --   ?PROT 6.8 6.2*  --   ?ALBUMIN 3.2* 2.9* 2.8*  ? ?No results for input(s): LIPASE, AMYLASE in the last 168 hours. ?No results for input(s): AMMONIA in the last 168 hours. ?Diabetic: ?No results for input(s): HGBA1C in the last 72 hours. ?Recent Labs  ?Lab 05/13/21 ?0009 05/13/21 ?0801 05/13/21 ?1600 05/14/21 ?6979 05/14/21 ?0751  ?GLUCAP 94 90 89 88 80  ? ?Cardiac Enzymes: ?No results for input(s): CKTOTAL, CKMB, CKMBINDEX, TROPONINI in the last 168 hours. ?No results for input(s): PROBNP in the last 8760 hours. ?Coagulation Profile: ?No results for input(s): INR, PROTIME in the last 168 hours. ?Thyroid Function Tests: ?No results for input(s): TSH, T4TOTAL, FREET4, T3FREE, THYROIDAB in the last 72 hours. ?Lipid Profile: ?No results for input(s): CHOL, HDL,  LDLCALC, TRIG, CHOLHDL, LDLDIRECT in the last 72 hours. ?Anemia Panel: ?No results for input(s): VITAMINB12, FOLATE, FERRITIN, TIBC, IRON, RETICCTPCT in the last 72 hours. ?Urine analysis: ?   ?Component

## 2021-05-16 NOTE — Evaluation (Signed)
Occupational Therapy Evaluation ?Patient Details ?Name: Raymond [name] ?MRN: [id number] ?DOB: 1960/09/25 ?Today's Date: 05/16/2021 ? ? ?History of Present Illness Raymond [name] is a 61 y.o. male with history of metastatic lung cancer, hypertension who was recently treated for sigmoid diverticulitis with abscess discharged 10 days ago after drain was placed. He started having abdominal pain again for the last few days without any nausea, vomiting or diarrhea.  Pain was mostly in the left lower quadrant with subjective fever and chills.  ? ?Clinical Impression ?  ?Patient evaluated by Occupational Therapy with no further acute OT needs identified. All education has been completed and the patient has no further questions. Patient is at his baseline for ADLs at this time.  See below for any follow-up Occupational Therapy or equipment needs. OT is signing off. Thank you for this referral. ? ?   ? ?Recommendations for follow up therapy are one component of a multi-disciplinary discharge planning process, led by the attending physician.  Recommendations may be updated based on patient status, additional functional criteria and insurance authorization.  ? ?Follow Up Recommendations ? No OT follow up  ?  ?Assistance Recommended at Discharge Frequent or constant Supervision/Assistance  ?Patient can return home with the following A little help with walking and/or transfers;A little help with bathing/dressing/bathroom;Assistance with cooking/housework;Direct supervision/assist for financial management;Assist for transportation;Direct supervision/assist for medications management;Help with stairs or ramp for entrance ? ?  ?Functional Status Assessment ? Patient has not had a recent decline in their functional status  ?Equipment Recommendations ? None recommended by OT  ?  ?Recommendations for Other Services   ? ? ?  ?Precautions / Restrictions Precautions ?Precautions: Fall ?Restrictions ?Weight Bearing Restrictions: No  ? ?   ? ?Mobility Bed Mobility ?Overal bed mobility: Modified Independent ?  ?  ?  ?  ?  ?  ?  ?  ? ?Transfers ?  ?  ?  ?  ?  ?  ?  ?  ?  ?  ?  ? ?  ?Balance Overall balance assessment: Needs assistance ?Sitting-balance support: Feet supported ?Sitting balance-Leahy Scale: Good ?  ?  ?Standing balance support: No upper extremity supported, During functional activity ?Standing balance-Leahy Scale: Fair ?  ?  ?  ?  ?  ?  ?  ?  ?  ?  ?  ?  ?   ? ?ADL either performed or assessed with clinical judgement  ? ?ADL Overall ADL's : At baseline ?  ?  ?  ?  ?  ?  ?  ?  ?  ?  ?  ?  ?  ?  ?  ?  ?  ?  ?  ?General ADL Comments: patient is supervision for ADLs at this time with patient noted to take himself to the bathroom with IV pole in room. patient was able to complete all tasks during session with SUP and safety cues.patient reported he does not have [place] OT at home.  patient reporetd using ladder at home for changing light bulbs. patient was educated on falls risk with use of ladder and benefits of asking for help from other loved ones v.s. completing that task himself. patient verbalized understanding. patient was able to participate in functional mobltiy with quad cane in hallway with cues to palce it flat on the ground v.s. using teh left front tip for stability. quad cane in room is patients personal one from home. patient's HR was noted to increase to 120 bpm with  nurse aware. nurse in room at end of session to adderss patients IV line leaking.  ? ? ? ?Vision Patient Visual Report: No change from baseline ?   ?   ?Perception   ?  ?Praxis   ?  ? ?Pertinent Vitals/Pain Pain Assessment ?Pain Assessment: Faces ?Faces Pain Scale: Hurts little more ?Pain Location: left back ?Pain Descriptors / Indicators: Constant, Aching ?Pain Intervention(s): Monitored during session, Limited activity within patient's tolerance  ? ? ? ?Hand Dominance Left ?  ?Extremity/Trunk Assessment Upper Extremity Assessment ?Upper Extremity Assessment: Overall  WFL for tasks assessed ?  ?Lower Extremity Assessment ?Lower Extremity Assessment: Defer to PT evaluation ?  ?Cervical / Trunk Assessment ?Cervical / Trunk Assessment: Normal ?  ?Communication Communication ?Communication: No difficulties ?  ?Cognition Arousal/Alertness: Awake/alert ?Behavior During Therapy: [facility] for tasks assessed/performed ?Overall Cognitive Status: Within Functional Limits for tasks assessed ?  ?  ?  ?  ?  ?  ?  ?  ?  ?  ?  ?  ?  ?  ?  ?  ?  ?  ?  ?General Comments    ? ?  ?Exercises   ?  ?Shoulder Instructions    ? ? ?Home Living Family/patient expects to be discharged to:: Private residence ?Living Arrangements: Spouse/significant other ?Available Help at Discharge: Family ?Type of Home: Apartment ?Home Access: Stairs to enter ?Entrance Stairs-Number of Steps: 8 ?Entrance Stairs-Rails: Right;Left ?Home Layout: One level ?  ?  ?Bathroom Shower/Tub: Tub/shower unit ?  ?Bathroom Toilet: Standard ?Bathroom Accessibility: Yes ?  ?Home Equipment: Kasandra Knudsen - single point;Shower seat;Rollator (4 wheels) ?  ?Additional Comments: pt's wife is assisting him ?  ? ?  ?Prior Functioning/Environment Prior Level of Function : Independent/Modified Independent ?  ?  ?  ?  ?  ?  ?Mobility Comments: Uses rollator at times, but mainly cane in the apartment because of space restrictions.  Uses walls to help ?ADLs Comments: pt reports wife is helping more with bathing/dressing with LB  as needed based on fatigue ?  ? ?  ?  ?OT Problem List:   ?  ?   ?OT Treatment/Interventions:    ?  ?OT Goals(Current goals can be found in the care plan section) Acute Rehab OT Goals ?Patient Stated Goal: to go home soon ?OT Goal Formulation: All assessment and education complete, DC therapy  ?OT Frequency:   ?  ? ?Co-evaluation   ?  ?  ?  ?  ? ?  ?AM-PAC OT "6 Clicks" Daily Activity     ?Outcome Measure Help from another person eating meals?: None ?Help from another person taking care of personal grooming?: None ?Help from another person  toileting, which includes using toliet, bedpan, or urinal?: A Little ?Help from another person bathing (including washing, rinsing, drying)?: A Little ?Help from another person to put on and taking off regular upper body clothing?: A Little ?Help from another person to put on and taking off regular lower body clothing?: A Little ?6 Click Score: 20 ?  ?End of Session Equipment Utilized During Treatment: Other (comment) (quad cane) ?Nurse Communication: Mobility status;Other (comment) (IV leaking) ? ?Activity Tolerance: Patient tolerated treatment well ?Patient left: in chair;with call bell/phone within reach ? ?OT Visit Diagnosis: Unsteadiness on feet (R26.81);Other abnormalities of gait and mobility (R26.89)  ?              ?Time: 4496-7591 ?OT Time Calculation (min): 17 min ?Charges:  OT General Charges ?$OT Visit: 1 Visit ?  OT Evaluation ?$OT Eval Low Complexity: 1 Low ? ?Khyrie Masi OTR/L, MS ?Acute Rehabilitation Department ?Office# [phone] ?Pager# [phone] ? ? ?[place] ?05/16/2021, 1:02 PM ?

## 2021-05-17 ENCOUNTER — Encounter: Payer: Self-pay | Admitting: Internal Medicine

## 2021-05-17 ENCOUNTER — Telehealth: Payer: Self-pay | Admitting: Internal Medicine

## 2021-05-17 ENCOUNTER — Other Ambulatory Visit (HOSPITAL_COMMUNITY): Payer: Self-pay

## 2021-05-17 DIAGNOSIS — N400 Enlarged prostate without lower urinary tract symptoms: Secondary | ICD-10-CM | POA: Diagnosis not present

## 2021-05-17 DIAGNOSIS — C3492 Malignant neoplasm of unspecified part of left bronchus or lung: Secondary | ICD-10-CM | POA: Diagnosis not present

## 2021-05-17 DIAGNOSIS — K5720 Diverticulitis of large intestine with perforation and abscess without bleeding: Secondary | ICD-10-CM | POA: Diagnosis not present

## 2021-05-17 DIAGNOSIS — F419 Anxiety disorder, unspecified: Secondary | ICD-10-CM | POA: Diagnosis not present

## 2021-05-17 DIAGNOSIS — G3184 Mild cognitive impairment, so stated: Secondary | ICD-10-CM

## 2021-05-17 MED ORDER — FOLIC ACID 1 MG PO TABS
1.0000 mg | ORAL_TABLET | Freq: Every day | ORAL | 0 refills | Status: DC
Start: 2021-05-17 — End: 2021-06-16
  Filled 2021-05-17: qty 90, 90d supply, fill #0

## 2021-05-17 MED ORDER — OXYCODONE HCL 5 MG PO TABS: 5.0000 mg | ORAL_TABLET | Freq: Four times a day (QID) | ORAL | 0 refills | Status: DC | PRN

## 2021-05-17 MED ORDER — AMOXICILLIN-POT CLAVULANATE 875-125 MG PO TABS
1.0000 | ORAL_TABLET | Freq: Two times a day (BID) | ORAL | 0 refills | Status: AC
Start: 2021-05-17 — End: 2021-05-27
  Filled 2021-05-17: qty 20, 10d supply, fill #0

## 2021-05-17 NOTE — Telephone Encounter (Signed)
Contacted patient to reschedule appointment. Advised on the message for them to call back so we can reschedule, since patient is still in the hospital. I did inform that I will keep it unscheduled until they call back.  ?

## 2021-05-17 NOTE — TOC Initial Note (Signed)
Transition of Care (TOC) - Initial/Assessment Note  ? ? ?Patient Details  ?Name: Raymond [name] ?MRN: [id number] ?Date of Birth: [birth year] ? ?Transition of Care [facility]) CM/SW Contact:    ?Leeroy Cha, RN ?Phone Number: ?05/17/2021, 10:58 AM ? ?Clinical Narrative:                 ?Patient dcd to return home. Is aware of area substance abuse centers. ? ?Expected Discharge Plan: Home/Self Care ?Barriers to Discharge: No Barriers Identified ? ? ?Patient Goals and CMS Choice ?  ?  ?Choice offered to / list presented to : Patient ? ?Expected Discharge Plan and Services ?Expected Discharge Plan: Home/Self Care ?  ?Discharge Planning Services: CM Consult ?  ?Living arrangements for the past 2 months: [place] ?Expected Discharge Date: 05/17/21               ?  ?  ?  ?  ?  ?  ?  ?  ?  ?  ? ?Prior Living Arrangements/Services ?Living arrangements for the past 2 months: [place] ?Lives with:: Spouse ?Patient language and need for interpreter reviewed:: Yes ?Do you feel safe going back to the place where you live?: Yes      ?  ?  ?  ?Criminal Activity/Legal Involvement Pertinent to Current Situation/Hospitalization: No - Comment as needed ? ?Activities of Daily Living ?Home Assistive Devices/Equipment: Cane (specify quad or straight), Walker (specify type) ?ADL Screening (condition at time of admission) ?Patient's cognitive ability adequate to safely complete daily activities?: Yes ?Is the patient deaf or have difficulty hearing?: No ?Does the patient have difficulty seeing, even when wearing glasses/contacts?: No ?Does the patient have difficulty concentrating, remembering, or making decisions?: No ?Patient able to express need for assistance with ADLs?: Yes ?Does the patient have difficulty dressing or bathing?: No ?Independently performs ADLs?: Yes (appropriate for developmental age) ?Does the patient have difficulty walking or climbing stairs?: Yes ?Weakness of Legs: Both ?Weakness of Arms/Hands:  None ? ?Permission Sought/Granted ?  ?  ?   ?   ?   ?   ? ?Emotional Assessment ?Appearance:: Appears stated age ?Attitude/Demeanor/Rapport: Engaged ?Affect (typically observed): Quiet ?Orientation: : Oriented to Self, Oriented to Place, Oriented to  Time, Oriented to Situation ?Alcohol / Substance Use: Illicit Drugs ?Psych Involvement: No (comment) ? ?Admission diagnosis:  Diverticulitis of intestine with abscess [K57.80] ?Left lower quadrant abdominal pain [R10.32] ?Patient Active Problem List  ? Diagnosis Date Noted  ? Abdominal wall cellulitis 04/28/2021  ? BPH (benign prostatic hyperplasia) 04/28/2021  ? Abdominal pain 04/28/2021  ? Oral thrush 04/13/2021  ? Decreased appetite 04/13/2021  ? Malignant neoplasm metastatic to brain [facility]) 03/09/2021  ? At risk for sleep apnea 03/08/2021  ? Blister of skin 03/07/2021  ? Bandemia 03/06/2021  ? Normocytic anemia 03/05/2021  ? Cognitive impairment 03/04/2021  ? Sigmoid diverticulitis with abscess 03/03/2021  ? Abscess of sigmoid colon due to diverticulitis 03/03/2021  ? Anxiety and depression 03/03/2021  ? 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 Hearing loss 03/01/2021  ? Lower urinary tract symptoms (LUTS) 01/27/2021  ? RLS (restless legs syndrome) 01/27/2021  ? Colitis presumed infectious 01/25/2021  ? Hypokalemia 01/25/2021  ? Sepsis ([place]) 01/24/2021  ? Essential hypertension 11/02/2020  ? Adenocarcinoma of left lung, stage 4 ([place]) 10/05/2020  ? Malignant neoplasm of upper lobe of left lung ([place]) 09/24/2020  ? Goals of care, counseling/discussion 09/24/2020  ? Mass of left lung 09/14/2020  ?  Hx of diverticulitis of colon 05/21/2019  ? Paraplegia, incomplete ([place]) 05/20/2019  ? Baker cyst 02/28/2015  ? Chest pain, exertional 02/28/2015  ? Baker's cyst 02/28/2015  ? Abnormal EKG 02/28/2015  ? MICROSCOPIC HEMATURIA 10/17/2007  ? ABDOMINAL PAIN 10/17/2007  ? HEMATURIA, HX OF 10/17/2007  ? PSYCHOLOGICAL STRESS 05/31/2007  ? Obesity, unspecified 04/19/2007  ?  TOBACCO ABUSE 04/19/2007  ? RHINITIS, ALLERGIC NOS 04/19/2006  ? FATIGUE 03/09/2006  ? WEIGHT GAIN 03/09/2006  ? HEADACHE 03/09/2006  ? Anxiety 03/02/2006  ? ?PCP:  Pcp, No ?Pharmacy:   ?[facility] ?[address] ?[place] [address] ?Phone: [phone] Fax: [phone] ? ? ? ? ?Social Determinants of Health (SDOH) Interventions ?  ? ?Readmission Risk Interventions ? ?  05/01/2021  ?  5:25 PM 03/04/2021  ? 11:02 AM  ?Readmission Risk Prevention Plan  ?Transportation Screening Complete Complete  ?PCP or Specialist Appt within 3-5 Days  Complete  ?[place] or Home Care Consult  Complete  ?Social Work Consult for [place] Planning/Counseling  Complete  ?Palliative Care Screening  Not Applicable  ?Medication Review [employer]) Complete Complete  ?SW Recovery Care/Counseling Consult Complete   ?Palliative Care Screening Complete   ?[place] Not Applicable   ? ? ? ?

## 2021-05-17 NOTE — Progress Notes (Signed)
Patient waiting on wife to pick him up. ?

## 2021-05-17 NOTE — Assessment & Plan Note (Signed)
Continue home Namenda. ?

## 2021-05-17 NOTE — Progress Notes (Signed)
Patient discharged, discharge instruction and prescription given and explained to patient, he verbalized understanding, patient denies any distress, PCP number provided by the case manager and given to patient. No pressure injury noted, accompanied home by wife.   ?

## 2021-05-17 NOTE — Discharge Summary (Signed)
? ?Physician Discharge Summary  ?Raymond [name] AVW:[id number] DOB: 02-04-1960 DOA: 05/12/2021 ? ?PCP: Pcp, No ? ?Admit date: 05/12/2021 ?Discharge date: 05/17/2021 ?Admitted From: Home ?Disposition: Home ?Recommendations for Outpatient Follow-up:  ?Follow up with surgery, oncology and radiation oncology as previously planned ?Please obtain CBC/BMP/Mag at follow up ?Please follow up on the following pending results: None ? ?Home Health: Not indicated ?Equipment/Devices: Not indicated ? ?Discharge Condition: Stable ?CODE STATUS: Full code ? ? ?Hospital course ?61 year old M with PMH of stage IV lung cancer with mets to brain s/p chemo and whole brain radiation, diverticulitis, HTN, depression and sigmoid: Diverticulitis with abscess for which he had a drain placement on 03/04/2021, and rehospitalization last months with drain exchange and additional course of antibiotics, returning with LLQ pain, fever and chills, and admitted for sigmoid colon diverticulitis with abscess and intractable pain.  CT showed a 2.6 cm fluid collection around the sigmoid colon.  Started on IV ceftriaxone and IV Flagyl.  General surgery and IR consulted.  IR did not feel additional drain placement is necessary.  Previous drain was removed in its entirety on 5/11.  Patient's abdominal pain improved.  He tolerated soft diet.  Antibiotics de-escalated to IV Unasyn on 5/13.  Discharged on p.o. Augmentin through 05/25/2021. ? ?Patient to follow-up with general surgery and oncology as previously planned.  Transition of care team consulted and provided information on PCP. ?  ?No need was identified after evaluation by therapy.  ? ?See individual problem list below for more on hospital course. ? ?Problems addressed during this hospitalization ?* Sigmoid diverticulitis with abscess ?Improved.  Discharged on p.o. Augmentin until 05/25/2021. ? ?Adenocarcinoma of left lung, stage 4 ([place]) ?With metastasis to brain and liver. S/p whole brain radiation and  chemotherapy.  Followed by Dr. Julien Nordmann.  Evaluated by palliative care last admission and wanted to proceed with aggressive treatment.  No longer on Decadron. Underwent simulation on 5/12.  Since radiation will start on 5/18 ?-Outpatient follow-up with radiation oncology. ? ?Normocytic anemia ?H&H relatively stable.  Recheck CBC at follow-up ? ?Essential hypertension ?Normotensive.  Not taking amlodipine at home. ? ?BPH (benign prostatic hyperplasia) ?Does not seem to be on any medication.  Does not seem to be symptomatic ? ?Mild cognitive impairment ?Continue home Namenda. ? ?Anxiety ?Partly contributing to his symptoms. ?-Continue home medication ? ?Obesity, unspecified ?Body mass index is 33.73 kg/m?. ? ? ? ?Vital signs ?Vitals:  ? 05/16/21 0308 05/16/21 1243 05/16/21 2000 05/17/21 0415  ?BP: 118/73 137/76 125/78 133/76  ?Pulse:  77 78 81  ?Temp: 98.1 ?F (36.7 ?C) 98.1 ?F (36.7 ?C) 98.4 ?F (36.9 ?C) 98.5 ?F (36.9 ?C)  ?Resp: 18 19 18 18   ?Height:      ?Weight:      ?SpO2: 94% 94% 97% 93%  ?TempSrc: Oral Oral  Oral  ?BMI (Calculated):      ?  ? ?Discharge exam ? ?GENERAL: No apparent distress.  Nontoxic. ?HEENT: MMM.  Vision and hearing grossly intact.  ?NECK: Supple.  No apparent JVD.  ?RESP:  No IWOB.  Fair aeration bilaterally. ?CVS:  RRR. Heart sounds normal.  ?ABD/GI/GU: BS+. Abd soft, NTND.  ?MSK/EXT:  Moves extremities. No apparent deformity. No edema.  ?SKIN: no apparent skin lesion or wound ?NEURO: Awake and alert. Oriented appropriately.  No apparent focal neuro deficit. ?PSYCH: Calm. Normal affect. ? ?Discharge Instructions ?Discharge Instructions   ? ? Call MD for:  extreme fatigue   Complete by: As directed ?  ? Call  MD for:  severe uncontrolled pain   Complete by: As directed ?  ? Call MD for:  temperature >100.4   Complete by: As directed ?  ? Diet general   Complete by: As directed ?  ? Soft diet for the next 3 to 4 days  ? Discharge instructions   Complete by: As directed ?  ? It has been a  pleasure taking care of you! ? ?You were hospitalized due to acute diverticulitis for which you have been treated with IV antibiotics.  We are discharging you on oral antibiotics to complete treatment course.  It is very important that she take the whole course of antibiotic.  Follow-up with your primary care doctor, oncologist and surgeon in 1 to 2 weeks or sooner if needed. ? ? ?Take care,  ? Increase activity slowly   Complete by: As directed ?  ? ?  ? ?Allergies as of 05/17/2021   ? ?   Reactions  ? Benadryl [diphenhydramine] Itching  ? 01/24/21 pt states this was a one time reaction and that he takes now when needed, also reported 05/12/21  ? ?  ? ?  ?Medication List  ?  ? ?STOP taking these medications   ? ?amLODipine 10 MG tablet ?Commonly known as: NORVASC ?  ?dexamethasone 4 MG tablet ?Commonly known as: DECADRON ?  ? ?  ? ?TAKE these medications   ? ?acetaminophen 650 MG CR tablet ?Commonly known as: TYLENOL ?Take 650 mg by mouth every 8 (eight) hours as needed for pain. ?  ?amoxicillin-clavulanate 875-125 MG tablet ?Commonly known as: AUGMENTIN ?Take 1 tablet by mouth every 12 hours for 10 days. ?  ?Benefiber Drink Mix Pack ?Take 1 Package by mouth daily at 6 (six) AM. ?  ?Ensure Plus Liqd ?Take 237 mLs by mouth 2 (two) times daily between meals. Chocolate ?  ?fluticasone 50 MCG/ACT nasal spray ?Commonly known as: FLONASE ?Place 1 spray into both nostrils daily. ?  ?folic acid 1 MG tablet ?Commonly known as: FOLVITE ?Take 1 tablet by mouth daily. ?  ?gabapentin 300 MG capsule ?Commonly known as: Neurontin ?Take 2 capsules (600 mg total) by mouth at bedtime. ?  ?memantine 10 MG tablet ?Commonly known as: Namenda ?Take 1 tablet (10 mg total) by mouth 2 (two) times daily. ?  ?mirtazapine 30 MG tablet ?Commonly known as: Remeron ?Take 1 tablet (30 mg total) by mouth at bedtime. ?  ?multivitamin with minerals Tabs tablet ?Take 1 tablet by mouth daily. ?What changed:  ?when to take this ?additional instructions ?   ?ondansetron 8 MG tablet ?Commonly known as: ZOFRAN ?Take 1 tablet (8 mg total) by mouth every 8 (eight) hours as needed for nausea or vomiting. ?  ?oxyCODONE 5 MG immediate release tablet ?Commonly known as: Oxy IR/ROXICODONE ?Take 1-2 tablets (5-10 mg total) by mouth every 6 (six) hours as needed for up to 5 days for severe pain. ?  ?prochlorperazine 10 MG tablet ?Commonly known as: COMPAZINE ?Take 1 tablet (10 mg total) by mouth every 6 (six) hours as needed for nausea or vomiting. ?  ?senna-docusate 8.6-50 MG tablet ?Commonly known as: Senna S ?Take 2 tablets by mouth daily. ?  ?vitamin B-12 1000 MCG tablet ?Commonly known as: CYANOCOBALAMIN ?Take 1,000 mcg by mouth daily. ?  ? ?  ? ? ?Consultations: ?General surgery ?Interventional radiology ? ?Procedures/Studies: ?Abdominal drain removal on 05/14/2343 ? ? ?DG Chest 2 View ? ?Result Date: 05/12/2021 ?CLINICAL DATA:  Chest pain, shortness of breath  EXAM: CHEST - 2 VIEW COMPARISON:  Previous studies including the chest radiograph done on 01/24/2021 and CT chest done on 04/29/2021 FINDINGS: Cardiac size is within normal limits. There is 5.7 cm homogeneous opacity in the left upper lung fields with small central fiduciary marker which has not changed significantly since 04/29/2021. There are ill-defined faint nodular densities in both lungs suggesting possible pulmonary metastatic disease. No definite new focal infiltrates are seen. There is no pleural effusion or pneumothorax. IMPRESSION: There is 5.7 cm radiopacity in the lateral left upper lung fields suggesting possible malignant neoplastic process. Part of this finding may suggest pneumonia. There are multiple small faint nodular densities in both lungs suggesting possible pulmonary metastatic disease. There are no signs of new focal infiltrates or pulmonary edema. Electronically Signed   By: Elmer Picker M.D.   On: 05/12/2021 17:42  ? ?CT CHEST W CONTRAST ? ?Result Date: 04/29/2021 ?CLINICAL DATA:   61 year old male for staging of non-small cell lung cancer. * Tracking Code: BO * EXAM: CT CHEST WITH CONTRAST TECHNIQUE: Multidetector CT imaging of the chest was performed during intravenous contrast administration.

## 2021-05-18 NOTE — Progress Notes (Signed)
[place] OFFICE PROGRESS NOTE  Pcp, No No address on file  DIAGNOSIS:  Stage IV (T2b, N2, M1c) non-small cell lung cancer, adenocarcinoma presented with left upper lobe lung mass in addition to left hilar and mediastinal lymphadenopathy diagnosed in September 2022. He also has metastatic disease to the brain.    Molecular Studies: No actionable mutations on tissue tested by [facility]   PD-L1 expression 10%.  PRIOR THERAPY: 1) SRS to the metastatic brain lesions under the care of Dr. Lisbeth Renshaw Scheduled for 10/21/20 2) Whole brain radiation under the care of Dr. Lisbeth Renshaw. Completed on 02/10/21 3)  Carboplatin for an AUC of 5, Alimta 500 mg per metered squared, Keytruda 200 mg IV every 3 weeks. First dose on 10/12/20. Status post 5 cycles. Discontinued due to disease progression   4) SRS to the metastatic brain lesion under the care of Dr. Lisbeth Renshaw on 05/20/2021. 5)  Docetaxel and Cyramaza IV every 3 weeks with neulasta support.  Status post 3 cycles.  Starting from cycle #2 we will discontinue Cyramza because of the recent diverticular abscess and perforation.   CURRENT THERAPY: Gemcitabine 1000 mg per metered squared on days 1 and 8 IV every 3 weeks.  First dose expected next week on 06/02/21.   INTERVAL HISTORY: Raymond [name] 61 y.o. male returns to the clinic today for a follow-up visit.  The patient has a complicated history with recurrent diverticulitis.  He had a diverticulitis with abscess status post drain placement on 03/04/2021.  He was rehospitalized on 4/15-5/1 for abdominal pain, chills, and increased drainage.  He was treated for diverticulitis with drain exchange and antibiotics.  He returned to the ER on 05/12/2021 for the chief complaint of left lower quadrant pain, fever, chills and readmitted for sigmoid diverticulitis with abscess and intractable pain.  His drain was removed on 05/13/2021.  He received IV antibiotics and was discharged home on Augmentin for which he is  taking his last dose of treatment today. Per chart review, it appears he is supposed to follow up with GI outpatient a few weeks after discharge but a referral has not been placed.   Throughout the course of his hospitalization, he had a CT scan of the chest performed which showed increased size and likely number of multiple pulmonary nodules which is markedly worse when compared to December 2022.  There is also worsening signs of skeletal disease since January. A CT of the abdomen and pelvis on 05/12/21 reports multiple lytic and sclerotic lesions within the pelvic bones and spine consistent with metastatic disease.  Also since last being seen, the patient had a routine follow-up brain MRI which showed 3 new small metastatic lesions within the thalami and left insula.  The patient underwent SRS to these lesions on 05/20/2021.  Since being discharged the patient is feeling fair today except for pain in the left and right hip and low back. He follows closely with psychiatry chaplain services palliative care, and social work. Socially, the patient was trying to go back to work to avoid further financial issues.  He is scheduled to meet with palliative care today.  When the patient saw a radiation oncology last week, they were going to review his imaging to see if any of the new osseous lesions are amenable to radiation.  Otherwise he denies any fevers today.  He lost 13 pounds and is supposed to see a member the nutritionist team while in the infusion room today.  He denies any abdominal  pain today.  He also reports that he has been having regular bowel movements for the last 3 to 4 days.  Denies any blood in the stool.  He denies any nausea or vomiting today.  He reports stable breathing except he reports mucus production in his nose and chest.  He is wondering what he can take for this.  He reports baseline dyspnea on exertion.  Denies any hemoptysis or chest pain.  He is here today for evaluation and repeat  blood work before starting the next cycle of treatment with single agent docetaxel.  MEDICAL HISTORY: Past Medical History:  Diagnosis Date   Arthritis    Baker's cyst    right   Diverticulitis    GERD (gastroesophageal reflux disease)    Heart attack ([place]) 01/04/2004   History of hiatal hernia    Hypertension    Malignant neoplasm of upper lobe of left lung ([place]) 09/24/2020   Pneumonia    as a child   Sleep apnea    no longer on Cpap    ALLERGIES:  is allergic to benadryl [diphenhydramine].  MEDICATIONS:  Current Outpatient Medications  Medication Sig Dispense Refill   acetaminophen (TYLENOL) 650 MG CR tablet Take 650 mg by mouth every 8 (eight) hours as needed for pain.     amoxicillin-clavulanate (AUGMENTIN) 875-125 MG tablet Take 1 tablet by mouth every 12 hours for 10 days. 20 tablet 0   Ensure Plus (ENSURE PLUS) LIQD Take 237 mLs by mouth 2 (two) times daily between meals. Chocolate     fluticasone (FLONASE) 50 MCG/ACT nasal spray Place 1 spray into both nostrils daily. 16 g 2   folic acid (FOLVITE) 1 MG tablet Take 1 tablet by mouth daily. 90 tablet 0   gabapentin (NEURONTIN) 300 MG capsule Take 2 capsules (600 mg total) by mouth at bedtime. 60 capsule 0   memantine (NAMENDA) 10 MG tablet Take 1 tablet (10 mg total) by mouth 2 (two) times daily. 60 tablet 4   mirtazapine (REMERON) 30 MG tablet Take 1 tablet (30 mg total) by mouth at bedtime. 30 tablet 0   Multiple Vitamin (MULTIVITAMIN WITH MINERALS) TABS tablet Take 1 tablet by mouth daily. (Patient taking differently: Take 1 tablet by mouth 2 (two) times daily. [employer])     ondansetron (ZOFRAN) 8 MG tablet Take 1 tablet (8 mg total) by mouth every 8 (eight) hours as needed for nausea or vomiting. 60 tablet 2   oxyCODONE (OXY IR/ROXICODONE) 5 MG immediate release tablet Take 1-2 tablets (5-10 mg total) by mouth every 4 (four) hours as needed for severe pain. 120 tablet 0   oxyCODONE (OXYCONTIN) 10 mg 12 hr tablet Take  1 tablet (10 mg total) by mouth every 12 (twelve) hours. 60 tablet 0   prochlorperazine (COMPAZINE) 10 MG tablet Take 1 tablet (10 mg total) by mouth every 6 (six) hours as needed for nausea or vomiting. 90 tablet 2   senna-docusate (SENNA S) 8.6-50 MG tablet Take 2 tablets by mouth daily. 60 tablet 2   vitamin B-12 (CYANOCOBALAMIN) 1000 MCG tablet Take 1,000 mcg by mouth daily.     Wheat Dextrin (BENEFIBER DRINK MIX) PACK Take 1 Package by mouth daily at 6 (six) AM.     No current facility-administered medications for this visit.    SURGICAL HISTORY:  Past Surgical History:  Procedure Laterality Date   BIOPSY  01/26/2021   Procedure: BIOPSY;  Surgeon: Ronnette Juniper, MD;  Location: [facility] ENDOSCOPY;  Service: Gastroenterology;;  BRONCHIAL BIOPSY  09/14/2020   Procedure: BRONCHIAL BIOPSIES;  Surgeon: Collene Gobble, MD;  Location: [facility] ENDOSCOPY;  Service: Cardiopulmonary;;   BRONCHIAL BRUSHINGS  09/14/2020   Procedure: BRONCHIAL BRUSHINGS;  Surgeon: Collene Gobble, MD;  Location: [place];  Service: Cardiopulmonary;;   BRONCHIAL NEEDLE ASPIRATION BIOPSY  09/14/2020   Procedure: BRONCHIAL NEEDLE ASPIRATION BIOPSIES;  Surgeon: Collene Gobble, MD;  Location: [place];  Service: Cardiopulmonary;;   CARDIAC CATHETERIZATION     CARPAL TUNNEL RELEASE     right   COLONOSCOPY WITH PROPOFOL N/A 01/26/2021   Procedure: COLONOSCOPY WITH PROPOFOL;  Surgeon: Ronnette Juniper, MD;  Location: [facility] ENDOS[facility];  Service: Gastroenterology;  Laterality: N/A;   FIDUCIAL MARKER PLACEMENT  09/14/2020   Procedure: FIDUCIAL MARKER PLACEMENT;  Surgeon: Collene Gobble, MD;  Location: [place];  Service: Cardiopulmonary;;   HAND SURGERY     left   IR CATHETER TUBE CHANGE  03/19/2021   IR CATHETER TUBE CHANGE  04/30/2021   IR FLUORO RM 30-60 MIN  03/18/2021   IR RADIOLOGIST EVAL & MGMT  04/07/2021   KNEE SURGERY     left   VIDEO BRONCHOSCOPY N/A 09/14/2020   Procedure: ROBOTIC VIDEO BRONCHOSCOPY WITH FLUORO;  Surgeon:  Collene Gobble, MD;  Location: [place];  Service: Cardiopulmonary;  Laterality: N/A;   VIDEO BRONCHOSCOPY WITH ENDOBRONCHIAL ULTRASOUND N/A 09/14/2020   Procedure: VIDEO BRONCHOSCOPY WITH ENDOBRONCHIAL ULTRASOUND;  Surgeon: Collene Gobble, MD;  Location: [place];  Service: Cardiopulmonary;  Laterality: N/A;   VIDEO BRONCHOSCOPY WITH RADIAL ENDOBRONCHIAL ULTRASOUND  09/14/2020   Procedure: VIDEO BRONCHOSCOPY WITH RADIAL ENDOBRONCHIAL ULTRASOUND;  Surgeon: Collene Gobble, MD;  Location: [facility] ENDOSCOPY;  Service: Cardiopulmonary;;    REVIEW OF SYSTEMS:   Review of Systems  Constitutional: Positive for fatigue, decreased appetite, weight loss.  Negative for chills and fever.  HENT: Positive for nasal congestion.  Negative for mouth sores, nosebleeds, sore throat and trouble swallowing.   Eyes: Negative for eye problems and icterus.  Respiratory: Positive for cough related to postnasal drainage and shortness of breath with exertion.  Negative for hemoptysis and wheezing.   Cardiovascular: Negative for chest pain and leg swelling.  Gastrointestinal: Negative for abdominal pain, constipation, diarrhea, nausea and vomiting.  Genitourinary: Negative for bladder incontinence, difficulty urinating, dysuria, frequency and hematuria.   Musculoskeletal: Positive for low back pain and right and left hip pain.  Negative for gait problem, neck pain and neck stiffness.  Skin: Negative for itching and rash.  Neurological: Negative for dizziness, extremity weakness, gait problem, headaches, light-headedness and seizures.  Hematological: Negative for adenopathy. Does not bruise/bleed easily.  Psychiatric/Behavioral: Negative for confusion, depression and sleep disturbance. The patient is not nervous/anxious.     PHYSICAL EXAMINATION:  Blood pressure (!) 140/93, pulse 98, temperature 98.4 F (36.9 C), temperature source Oral, resp. rate 18, height $RemoveBe'5\' 11"'uKNEShRjN$  (1.803 m), weight 235 lb 8 oz (106.8 kg), SpO2 96  %.  ECOG PERFORMANCE STATUS: 1-2  Physical Exam  Constitutional: Oriented to person, place, and time and well-developed, well-nourished, and in no distress.  HENT:  Head: Normocephalic and atraumatic.  Mouth/Throat: Oropharynx is clear and moist. No oropharyngeal exudate.  Eyes: Conjunctivae are normal. Right eye exhibits no discharge. Left eye exhibits no discharge. No scleral icterus.  Neck: Normal range of motion. Neck supple.  Cardiovascular: Normal rate, regular rhythm, normal heart sounds and intact distal pulses.   Pulmonary/Chest: Effort normal and breath sounds normal. No respiratory distress. No wheezes. No rales.  Abdominal: Soft. Bowel sounds are normal. Exhibits no distension and no mass. There is no tenderness.  Musculoskeletal: Normal range of motion. Exhibits no edema.  Lymphadenopathy:    No cervical adenopathy.  Neurological: Alert and oriented to person, place, and time. Exhibits normal muscle tone.  Examined in the wheelchair today.  Skin: Skin is warm and dry. No rash noted. Not diaphoretic. No erythema. No pallor.  Psychiatric: Mood, memory and judgment normal.  Vitals reviewed.  LABORATORY DATA: Lab Results  Component Value Date   WBC 8.7 05/25/2021   HGB 11.4 (L) 05/25/2021   HCT 35.1 (L) 05/25/2021   MCV 88.2 05/25/2021   PLT 326 05/25/2021      Chemistry      Component Value Date/Time   NA 139 05/25/2021 1041   K 4.3 05/25/2021 1041   CL 104 05/25/2021 1041   CO2 25 05/25/2021 1041   BUN 12 05/25/2021 1041   CREATININE 0.76 05/25/2021 1041      Component Value Date/Time   CALCIUM 9.9 05/25/2021 1041   ALKPHOS 161 (H) 05/25/2021 1041   AST 40 05/25/2021 1041   ALT 24 05/25/2021 1041   BILITOT 0.5 05/25/2021 1041       RADIOGRAPHIC STUDIES:  DG Chest 2 View  Result Date: 05/12/2021 CLINICAL DATA:  Chest pain, shortness of breath EXAM: CHEST - 2 VIEW COMPARISON:  Previous studies including the chest radiograph done on 01/24/2021 and CT  chest done on 04/29/2021 FINDINGS: Cardiac size is within normal limits. There is 5.7 cm homogeneous opacity in the left upper lung fields with small central fiduciary marker which has not changed significantly since 04/29/2021. There are ill-defined faint nodular densities in both lungs suggesting possible pulmonary metastatic disease. No definite new focal infiltrates are seen. There is no pleural effusion or pneumothorax. IMPRESSION: There is 5.7 cm radiopacity in the lateral left upper lung fields suggesting possible malignant neoplastic process. Part of this finding may suggest pneumonia. There are multiple small faint nodular densities in both lungs suggesting possible pulmonary metastatic disease. There are no signs of new focal infiltrates or pulmonary edema. Electronically Signed   By: Elmer Picker M.D.   On: 05/12/2021 17:42   CT CHEST W CONTRAST  Result Date: 04/29/2021 CLINICAL DATA:  61 year old male for staging of non-small cell lung cancer. * Tracking Code: BO * EXAM: CT CHEST WITH CONTRAST TECHNIQUE: Multidetector CT imaging of the chest was performed during intravenous contrast administration. RADIATION DOSE REDUCTION: This exam was performed according to the departmental dose-optimization program which includes automated exposure control, adjustment of the mA and/or kV according to patient size and/or use of iterative reconstruction technique. CONTRAST:  20mL OMNIPAQUE IOHEXOL 300 MG/ML  SOLN COMPARISON:  January 24, 2021. FINDINGS: Cardiovascular: Aortic caliber is normal. Heart size is normal. No pericardial effusion. Central pulmonary vasculature unremarkable on venous phase. Mediastinum/Nodes: Slight decrease in size of mediastinal nodal tissue compared to the previous imaging study. Pre-vascular lymph node 11 mm previously 13 mm (image 52/2) Subcarinal nodal tissue 9-10 mm short axis, previously 13-14 mm. Similar behavior of other top-normal size lymph nodes throughout the chest. No  thoracic inlet adenopathy. No axillary adenopathy. Esophagus grossly normal. Lungs/Pleura: LEFT pulmonary mass with fiducial marker in the central aspect measuring 5.9 x 4.5 cm remains contiguous with the LEFT hilum. (Image 57/5) decreased bulk of tumor which could be measured in a single plane up to 6.6 x 4.5 cm on the prior study. Innumerable pulmonary nodules. LEFT upper lobe nodule (image  32/5) 10 mm previously 7 mm. LEFT upper lobe nodule (image 39/5) 13 mm previously 11 mm. Satellite nodules are similar. RIGHT lower lobe pulmonary nodule (image 100/5) 8 mm on the prior study, 11 mm on the current exam Respiratory motion limiting assessment of the previous imaging study. Increasing conspicuity of all visible nodules following above nodules. Possibility of new subcentimeter nodules is highly likely though a gang direct comparison is difficult likely new nodules ranging from 2-3 mm throughout the chest. When compared to the December evaluation there is obvious worsening of disease in the lungs. No signs of pleural effusion or sign of consolidative changes. Upper Abdomen: Cholelithiasis. No acute upper abdominal process to the extent evaluated. LEFT adrenal thickening up to 10 mm is stable. No upper abdominal lymphadenopathy. Musculoskeletal: Cortical disruption of LEFT posterior third rib (image 45/2) lucent area measuring up to 11 mm. Sternal lesion with lytic features (image 69/2) not present in January. Subtle sclerotic and irregular appearing area along the lateral margin of the RIGHT seventh rib with scattered small sclerotic foci also in the RIGHT posterior and lateral sixth rib on image 89/2. Multifocal lytic changes in the thoracic spine, largest area at the T11 level measuring 9 mm (image 134/2. Involvement of T3, T4 and T5 with scattered lytic foci and cortical destruction along the posterior margin of T3 (image 90/7) lytic changes also present in the sternal manubrium. IMPRESSION: 1. Increasing size  and likely number of multiple pulmonary nodules markedly worse when compared to December of 2022. 2. Signs of skeletal metastatic disease that are new since January 20 second of 2022. 3. Decrease in bulk of the dominant mass, is size decrease as well as measured in single axial plane and decreased size of mediastinal lymph nodes. 4. LEFT adrenal lesion unchanged. 5. Cholelithiasis. Electronically Signed   By: Zetta Bills M.D.   On: 04/29/2021 11:08   CT Angio Chest PE W and/or Wo Contrast  Result Date: 05/12/2021 CLINICAL DATA:  Chest pain EXAM: CT ANGIOGRAPHY CHEST CT ABDOMEN AND PELVIS WITH CONTRAST TECHNIQUE: Multidetector CT imaging of the chest was performed using the standard protocol during bolus administration of intravenous contrast. Multiplanar CT image reconstructions and MIPs were obtained to evaluate the vascular anatomy. Multidetector CT imaging of the abdomen and pelvis was performed using the standard protocol during bolus administration of intravenous contrast. RADIATION DOSE REDUCTION: This exam was performed according to the departmental dose-optimization program which includes automated exposure control, adjustment of the mA and/or kV according to patient size and/or use of iterative reconstruction technique. CONTRAST:  166mL OMNIPAQUE IOHEXOL 350 MG/ML SOLN COMPARISON:  Chest x-ray 05/12/2021, CT 04/29/2021, 04/27/2021, 03/18/2021, 12/17/2020 FINDINGS: CTA CHEST FINDINGS Cardiovascular: Satisfactory opacification of the pulmonary arteries to the segmental level. No evidence of pulmonary embolism. No dissection is seen. Normal cardiac size. No pericardial effusion. Mediastinum/Nodes: Midline trachea. No thyroid mass. Enlarged prevascular node measuring 10 mm as before. Left paratracheal node measures 13 mm. Esophagus within normal limits. Lungs/Pleura: No pleural effusion or pneumothorax. Innumerable pulmonary nodules consistent with metastatic disease. Irregular left upper lobe lung mass  measures approximately 6.1 by 4.5 cm, previously 6 x 4.5 cm with central marker. Musculoskeletal: Sternum is intact. No fracture. Skeletal metastatic disease with multiple lucent and mildly sclerotic lesions involving the bilateral ribs and spine as well as the sternum. Review of the MIP images confirms the above findings. CT ABDOMEN and PELVIS FINDINGS Hepatobiliary: Subtle hypodense liver lesions suspicious for metastatic disease. Gallstones. No biliary dilatation Pancreas: Unremarkable. No  pancreatic ductal dilatation or surrounding inflammatory changes. Spleen: Normal in size without focal abnormality. Adrenals/Urinary Tract: Adrenal glands are normal. Punctate stone left kidney. No hydronephrosis. The bladder is unremarkable Stomach/Bowel: The stomach is nonenlarged. No dilated small bowel. Redemonstrated focal wall thickening at the sigmoid colon with mild inflammation. Percutaneous drainage catheter has been retracted with the pigtail visualized in the subcutaneous fat of the left abdominal wall. Small rim enhancing fluid collection adjacent to the sigmoid colon measuring 2.6 by 1.7 cm. Vascular/Lymphatic: Moderate aortic atherosclerosis. No aneurysm. No suspicious lymph nodes. Reproductive: Prostate is unremarkable. Other: Negative for pelvic effusion or free air Musculoskeletal: Multiple lytic and sclerotic lesions within the pelvic bones and spine consistent with metastatic disease. Review of the MIP images confirms the above findings. IMPRESSION: 1. Negative for acute pulmonary embolus. 2. Grossly stable size large left upper lobe lung mass since CT from April. Innumerable pulmonary nodules consistent with metastatic disease, probably stable as compared with April 29, 2021, definitely progressed from CT chest previous to this. Similar mild mediastinal nodes. 3. Left lower quadrant drainage catheter has been retracted and the pigtail is now within the subcutaneous fat of the left abdominal wall. Small  residual thick rimmed fluid collection adjacent to sigmoid colon measuring 2.6 cm. Mild residual soft tissue inflammatory process at the sigmoid colon with wall thickening which may be inflammatory or neoplastic. 4. Subtle hypodense liver lesions suspect for metastatic disease 5. Multiple lucent and sclerotic lesions within the ribs, sternum, spine and pelvis consistent with metastatic disease 6. Gallstones 7. Nonobstructing left kidney stone Electronically Signed   By: Jasmine Pang M.D.   On: 05/12/2021 21:47   MR BRAIN W WO CONTRAST  Result Date: 05/09/2021 CLINICAL DATA:  Brain/CNS neoplasm, assess treatment response. SRS protocol EXAM: MRI HEAD WITHOUT AND WITH CONTRAST TECHNIQUE: Multiplanar, multiecho pulse sequences of the brain and surrounding structures were obtained without and with intravenous contrast. CONTRAST:  61mL MULTIHANCE GADOBENATE DIMEGLUMINE 529 MG/ML IV SOLN COMPARISON:  01/21/2021 FINDINGS: Brain: No acute infarct, mass effect or extra-axial collection. 2-3 scattered foci of chronic microhemorrhage. There is multifocal hyperintense T2-weighted signal within the white matter. Parenchymal volume and CSF spaces are normal. There are numerous scattered punctate contrast enhancing parenchymal lesions. Most of these are unchanged, but there are new lesions: 1. Right thalamus (series 12, image 86) 2. Left thalamus (image 86) 3. Superior cerebellar vermis (image 60) Vascular: Major flow voids are preserved. Skull and upper cervical spine: Normal calvarium and skull base. Visualized upper cervical spine and soft tissues are normal. Sinuses/Orbits:Bilateral mastoid effusions. Diffuse moderate paranasal sinus mucosal thickening. Sinus normal orbits. IMPRESSION: 1. Three new, small metastatic lesions located within the thalami and left insula 2. Otherwise unchanged appearance of numerous scattered small metastases. Electronically Signed   By: Deatra Robinson M.D.   On: 05/09/2021 02:09   CT ABDOMEN  PELVIS W CONTRAST  Result Date: 05/12/2021 CLINICAL DATA:  Chest pain EXAM: CT ANGIOGRAPHY CHEST CT ABDOMEN AND PELVIS WITH CONTRAST TECHNIQUE: Multidetector CT imaging of the chest was performed using the standard protocol during bolus administration of intravenous contrast. Multiplanar CT image reconstructions and MIPs were obtained to evaluate the vascular anatomy. Multidetector CT imaging of the abdomen and pelvis was performed using the standard protocol during bolus administration of intravenous contrast. RADIATION DOSE REDUCTION: This exam was performed according to the departmental dose-optimization program which includes automated exposure control, adjustment of the mA and/or kV according to patient size and/or use of iterative reconstruction technique. CONTRAST:  170mL OMNIPAQUE IOHEXOL 350 MG/ML SOLN COMPARISON:  Chest x-ray 05/12/2021, CT 04/29/2021, 04/27/2021, 03/18/2021, 12/17/2020 FINDINGS: CTA CHEST FINDINGS Cardiovascular: Satisfactory opacification of the pulmonary arteries to the segmental level. No evidence of pulmonary embolism. No dissection is seen. Normal cardiac size. No pericardial effusion. Mediastinum/Nodes: Midline trachea. No thyroid mass. Enlarged prevascular node measuring 10 mm as before. Left paratracheal node measures 13 mm. Esophagus within normal limits. Lungs/Pleura: No pleural effusion or pneumothorax. Innumerable pulmonary nodules consistent with metastatic disease. Irregular left upper lobe lung mass measures approximately 6.1 by 4.5 cm, previously 6 x 4.5 cm with central marker. Musculoskeletal: Sternum is intact. No fracture. Skeletal metastatic disease with multiple lucent and mildly sclerotic lesions involving the bilateral ribs and spine as well as the sternum. Review of the MIP images confirms the above findings. CT ABDOMEN and PELVIS FINDINGS Hepatobiliary: Subtle hypodense liver lesions suspicious for metastatic disease. Gallstones. No biliary dilatation Pancreas:  Unremarkable. No pancreatic ductal dilatation or surrounding inflammatory changes. Spleen: Normal in size without focal abnormality. Adrenals/Urinary Tract: Adrenal glands are normal. Punctate stone left kidney. No hydronephrosis. The bladder is unremarkable Stomach/Bowel: The stomach is nonenlarged. No dilated small bowel. Redemonstrated focal wall thickening at the sigmoid colon with mild inflammation. Percutaneous drainage catheter has been retracted with the pigtail visualized in the subcutaneous fat of the left abdominal wall. Small rim enhancing fluid collection adjacent to the sigmoid colon measuring 2.6 by 1.7 cm. Vascular/Lymphatic: Moderate aortic atherosclerosis. No aneurysm. No suspicious lymph nodes. Reproductive: Prostate is unremarkable. Other: Negative for pelvic effusion or free air Musculoskeletal: Multiple lytic and sclerotic lesions within the pelvic bones and spine consistent with metastatic disease. Review of the MIP images confirms the above findings. IMPRESSION: 1. Negative for acute pulmonary embolus. 2. Grossly stable size large left upper lobe lung mass since CT from April. Innumerable pulmonary nodules consistent with metastatic disease, probably stable as compared with April 29, 2021, definitely progressed from CT chest previous to this. Similar mild mediastinal nodes. 3. Left lower quadrant drainage catheter has been retracted and the pigtail is now within the subcutaneous fat of the left abdominal wall. Small residual thick rimmed fluid collection adjacent to sigmoid colon measuring 2.6 cm. Mild residual soft tissue inflammatory process at the sigmoid colon with wall thickening which may be inflammatory or neoplastic. 4. Subtle hypodense liver lesions suspect for metastatic disease 5. Multiple lucent and sclerotic lesions within the ribs, sternum, spine and pelvis consistent with metastatic disease 6. Gallstones 7. Nonobstructing left kidney stone Electronically Signed   By: Donavan Foil M.D.   On: 05/12/2021 21:47   CT ABDOMEN PELVIS W CONTRAST  Result Date: 04/27/2021 CLINICAL DATA:  Left lower quadrant abscess with drainage catheter in place. Increasing drainage. EXAM: CT ABDOMEN AND PELVIS WITH CONTRAST TECHNIQUE: Multidetector CT imaging of the abdomen and pelvis was performed using the standard protocol following bolus administration of intravenous contrast. RADIATION DOSE REDUCTION: This exam was performed according to the departmental dose-optimization program which includes automated exposure control, adjustment of the mA and/or kV according to patient size and/or use of iterative reconstruction technique. CONTRAST:  149mL OMNIPAQUE IOHEXOL 300 MG/ML  SOLN COMPARISON:  04/17/2021 FINDINGS: Lower chest: Numerous pulmonary nodules again seen in the lower lobes, unchanged. No effusions. Heart is normal size. Hepatobiliary: Multiple gallstones within the gallbladder. No biliary ductal dilatation. No focal hepatic abnormality. Pancreas: No focal abnormality or ductal dilatation. Spleen: No focal abnormality.  Normal size. Adrenals/Urinary Tract: Punctate 1 mm stones in the upper and midpole  of the left kidney. No stones on the right. No ureteral stones or hydronephrosis. Urinary bladder and adrenal glands unremarkable. Stomach/Bowel: Stomach, large and small bowel grossly unremarkable. Vascular/Lymphatic: Aortic and iliac calcifications. No evidence of aneurysm or adenopathy. Reproductive: No visible focal abnormality. Other: No free fluid or free air. Left lower quadrant pigtail drainage catheter remains in place adjacent to the proximal sigmoid colon. No residual fluid collection around the drainage catheter. Musculoskeletal: No acute bony abnormality. IMPRESSION: Left lower quadrant drainage catheter remains in place adjacent to the proximal sigmoid colon. No residual fluid collection surrounding the catheter. Punctate left nephrolithiasis. Cholelithiasis. No acute findings.  Aortic atherosclerosis. Electronically Signed   By: Rolm Baptise M.D.   On: 04/27/2021 23:32   IR Catheter Tube Change  Result Date: 04/30/2021 INDICATION: 61 year old gentleman with left lower quadrant diverticular abscess drain and chronic fistula presents to IR for downsizing of drain. Drain initially placed on 03/04/2021. EXAM: Fluoroscopic drain exchange MEDICATIONS: None ANESTHESIA/SEDATION: None COMPLICATIONS: None immediate. PROCEDURE: Informed written consent was obtained from the patient after a thorough discussion of the procedural risks, benefits and alternatives. All questions were addressed. Maximal Sterile Barrier Technique was utilized including caps, mask, sterile gowns, sterile gloves, sterile drape, hand hygiene and skin antiseptic. A timeout was performed prior to the initiation of the procedure. Patient positioned supine on the procedure table. The external segment of the left lower quadrant drain and surrounding skin prepped and draped usual fashion. Contrast administered through the drain under fluoroscopy again showed fistulous communication with the colon. The existing 12.0 French drain was cut and removed over 0.035 inch guidewire. New 10.2 French multipurpose pigtail drain was inserted over a guidewire and positioned within the left lower quadrant. Final position confirmed with fluoroscopy. Drain flushed and attached to bag. Drain secured to skin with StatLock and covered with sterile dressing. IMPRESSION: Successful downsizing of left lower quadrant abscess drain from 12.0 [place] to 10.2 [place] given chronic fistulous communication. PLAN: Please contact IR when patient is ready for discharge so that follow-up appointment can be established for clinic. Electronically Signed   By: Miachel Roux M.D.   On: 04/30/2021 16:43     ASSESSMENT/PLAN:  This is a very pleasant 61 year old African-American male with stage IV (T2b, N2, M1 C) non-small cell lung cancer, adenocarcinoma.  He  presented with a left upper lobe lung mass in addition to left hilar and mediastinal lymphadenopathy.  He had also had a small left iliac osseous lesion and metastatic disease to the brain.  He was diagnosed in September 2022.  PD-L1 expression 10%.  Guardant 360 does not have enough circulating tumor and his tissue was negative for any actionable mutations.   He completed SRS to the metastatic brain lesion under the care of Dr. Lisbeth Renshaw in October 2022.    The patient had disease progression in the brain in January 2023 and underwent whole brain radiation under the care of Dr. Lisbeth Renshaw which was completed on 02/10/2021.    The patient underwent palliative systemic chemotherapy with carboplatin for an AUC of 5, Alimta 500 mg per metered squared, Keytruda 200 mg IV every 3 weeks.  The patient status post 5 cycles tolerated it fairly well.  Starting from cycle #5, the patient began maintenance Alimta and Keytruda.This was discontinued in February 2023 due to disease progression.    Due to the disease progression, the patient is currently on docetaxel 75 mg per metered squared and Cyramza 10 mg/kg IV every 3 weeks with Neulasta support.  The patient status post 3 cycles.  He had a challenging time with the Neulasta injection.  Cyramza was discontinued after cycle #1 due to possible GI perforation.   The patient was recently hospitalized for recurrent diverticulitis and abscess.  The patient had some reimaging studies of the chest, abdomen, and pelvis while in the hospital.  Dr. Julien Nordmann personally and independently reviewed the scan discussed results with the patient today.  Unfortunately looks like the patient had some evidence of disease progression with increased size and number of multiple pulmonary nodules which is markedly worse when compared to December 2022.  There is also signs of new skeletal metastatic disease which is also new from January.  Dr. Julien Nordmann discussed that consideration of palliative care and  hospice would be reasonable in his condition as he has progressed quickly through several lines of chemotherapy in a short interval.  The patient is not interested in palliative care and hospice at this point and would like to try other options.  Dr. Julien Nordmann recommended gemcitabine 1000 mg on days 1 and 8 IV every 3 weeks.   The adverse side effects of treatment were discussed including but not limited to nausea, vomiting, myelosuppression, kidney, and liver dysfunction.  We anticipate him to start his first cycle of gemcitabine next week on 06/02/2020.  I have reached out to radiation oncology and they are going to get in touch with the patient regarding additional palliative radiation to the painful new metastatic lesions in the low back/pelvis.  He is also scheduled to meet with palliative care today regarding pain management  I have placed referral to gastroenterology to establish care regarding his recurrent diverticulitis  He will continue to follow with the member the nutritionist team regarding his decreased appetite and weight loss.  I instructed the patient to pick up Mucinex regarding his concerns with his phlegm/mucus production.  The patient was advised to call immediately if he has any concerning symptoms in the interval. The patient voices understanding of current disease status and treatment options and is in agreement with the current care plan. All questions were answered. The patient knows to call the clinic with any problems, questions or concerns. We can certainly see the patient much sooner if necessary      Orders Placed This Encounter  Procedures   Ambulatory referral to Gastroenterology    Referral Priority:   Routine    Referral Type:   Consultation    Referral Reason:   Specialty Services Required    Number of Visits Requested:   Armstrong, PA-C 05/25/21  ADDENDUM: Hematology/Oncology Attending: I had a face-to-face encounter  with the patient today.  I reviewed her records, lab, scan and recommended his care plan.  This is a very pleasant 61 years old African-American male diagnosed with a stage IV non-small cell lung cancer, adenocarcinoma in September 2022 presented with left upper lobe lung mass in addition to left hilar and mediastinal lymphadenopathy as well as multiple and recurrent metastatic brain metastasis.  The patient has no actionable mutations and PD-L1 expression was 10%. He initially underwent SRS to metastatic brain lesion but this was followed by whole brain irradiation because of multiple new metastatic lesions.  He was treated initially with systemic chemotherapy with carboplatin, Alimta and Keytruda for 5 cycles discontinued secondary to disease progression.  The patient then started second line treatment with docetaxel and Cyramza but this was again discontinued secondary to diverticular abscess and perforation in  addition to recent disease progression. I had a lengthy discussion with the patient today about his current condition and treatment options. I explained to the patient that the recent imaging studies showed evidence for metastatic disease with multiple new and enlarging pulmonary nodules. I explained to the patient that he has incurable condition and all the treatment are of palliative nature. He was given the option of consideration of palliative care and hospice referral versus third line treatment with single agent gemcitabine 1000 mg/M2 on days 1 and 8 every 3 weeks. The patient is not interested on hospice at this point and he would like further treatment. I discussed with him the adverse effect of this treatment including but not limited to alopecia, myelosuppression, nausea and vomiting, peripheral neuropathy, liver or renal dysfunction. He is expected to start the first cycle of this treatment with gemcitabine next week. The patient will come back for follow-up visit in 2 weeks for  evaluation before starting day 8 of cycle #1. He was advised to call immediately if he has any other concerning symptoms in the interval. The total time spent in the appointment was 30 minutes. Disclaimer: This note was dictated with voice recognition software. Similar sounding words can inadvertently be transcribed and may be missed upon review. Eilleen Kempf, MD

## 2021-05-19 ENCOUNTER — Other Ambulatory Visit (HOSPITAL_COMMUNITY): Payer: Self-pay

## 2021-05-19 ENCOUNTER — Other Ambulatory Visit: Payer: Self-pay | Admitting: Physician Assistant

## 2021-05-19 DIAGNOSIS — B370 Candidal stomatitis: Secondary | ICD-10-CM

## 2021-05-20 ENCOUNTER — Other Ambulatory Visit: Payer: Self-pay

## 2021-05-20 ENCOUNTER — Ambulatory Visit: Payer: Medicaid Other | Admitting: Psychologist

## 2021-05-20 ENCOUNTER — Encounter: Payer: Self-pay | Admitting: Internal Medicine

## 2021-05-20 ENCOUNTER — Ambulatory Visit
Admission: RE | Admit: 2021-05-20 | Discharge: 2021-05-20 | Disposition: A | Discharge status: Home / Self Care | Payer: Medicaid Other | Source: Ambulatory Visit | Attending: Radiation Oncology | Admitting: Radiation Oncology

## 2021-05-20 ENCOUNTER — Other Ambulatory Visit: Payer: Medicaid Other

## 2021-05-20 ENCOUNTER — Encounter: Payer: Self-pay | Admitting: Licensed Clinical Social Worker

## 2021-05-20 ENCOUNTER — Other Ambulatory Visit: Payer: Self-pay | Admitting: Radiation Oncology

## 2021-05-20 ENCOUNTER — Other Ambulatory Visit (HOSPITAL_COMMUNITY): Payer: Self-pay

## 2021-05-20 ENCOUNTER — Other Ambulatory Visit: Payer: Self-pay | Admitting: Nurse Practitioner

## 2021-05-20 ENCOUNTER — Encounter: Payer: Self-pay | Admitting: Radiation Oncology

## 2021-05-20 DIAGNOSIS — C3492 Malignant neoplasm of unspecified part of left bronchus or lung: Secondary | ICD-10-CM | POA: Insufficient documentation

## 2021-05-20 DIAGNOSIS — Z510 Encounter for antineoplastic radiation therapy: Secondary | ICD-10-CM | POA: Insufficient documentation

## 2021-05-20 DIAGNOSIS — C7931 Secondary malignant neoplasm of brain: Secondary | ICD-10-CM | POA: Diagnosis present

## 2021-05-20 LAB — RAD ONC ARIA SESSION SUMMARY
Course Elapsed Days: 0
Plan Fractions Treated to Date: 1
Plan Prescribed Dose Per Fraction: 20 Gy
Plan Total Fractions Prescribed: 1
Plan Total Prescribed Dose: 20 Gy
Reference Point Dosage Given to Date: 20 Gy
Reference Point Session Dosage Given: 13.5029 Gy
Session Number: 1

## 2021-05-20 MED ORDER — OXYCODONE HCL 5 MG PO TABS
5.0000 mg | ORAL_TABLET | ORAL | 0 refills | Status: DC | PRN
Start: 2021-05-20 — End: 2021-06-02
  Filled 2021-05-20: qty 120, 10d supply, fill #0

## 2021-05-20 NOTE — Progress Notes (Signed)
Pt came for [facility] treatment for brain metastasis from lung cancer and complained of pain in the pelvis and low back while in the department. He needs a new prescription for pain medication. Dr. Lisbeth Renshaw is reviewing films which show multiple lesions in the pelvis to see about options of radiotherapy. A new Rx was sent to his pharmacy.

## 2021-05-20 NOTE — Progress Notes (Signed)
Mr. Kornegay rested with [place] for 30 minutes following his SRS treatment.  Patient denies headache, dizziness, nausea, diplopia or ringing in the ears. Denies fatigue. Patient states he is having some pain across the small of his back and into his hips.  Understands to avoid strenuous activity for the next 24 hours and call [phone] with needs.   BP (!) 145/84   Pulse 86   Temp (!) 97 F (36.1 C)   Resp 18   SpO2 98%    Ryaan Vanwagoner M. Leonie Green, BSN

## 2021-05-21 ENCOUNTER — Encounter: Payer: Self-pay | Admitting: Internal Medicine

## 2021-05-21 ENCOUNTER — Telehealth: Payer: Self-pay | Admitting: Internal Medicine

## 2021-05-21 NOTE — Telephone Encounter (Signed)
Called patient regarding upcoming appointment, patient is notified. °

## 2021-05-25 ENCOUNTER — Encounter: Payer: Self-pay | Admitting: Internal Medicine

## 2021-05-25 ENCOUNTER — Inpatient Hospital Stay (HOSPITAL_BASED_OUTPATIENT_CLINIC_OR_DEPARTMENT_OTHER): Payer: Medicaid Other | Admitting: Physician Assistant

## 2021-05-25 ENCOUNTER — Inpatient Hospital Stay: Payer: Medicaid Other

## 2021-05-25 ENCOUNTER — Other Ambulatory Visit (HOSPITAL_COMMUNITY): Payer: Self-pay

## 2021-05-25 ENCOUNTER — Other Ambulatory Visit: Payer: Self-pay | Admitting: Internal Medicine

## 2021-05-25 ENCOUNTER — Inpatient Hospital Stay (HOSPITAL_BASED_OUTPATIENT_CLINIC_OR_DEPARTMENT_OTHER): Payer: Medicaid Other | Admitting: Nurse Practitioner

## 2021-05-25 ENCOUNTER — Encounter: Payer: Self-pay | Admitting: Nurse Practitioner

## 2021-05-25 ENCOUNTER — Inpatient Hospital Stay: Payer: Medicaid Other | Admitting: Dietician

## 2021-05-25 ENCOUNTER — Encounter: Payer: Self-pay | Admitting: Radiation Oncology

## 2021-05-25 VITALS — BP 140/93 | HR 98 | Temp 98.4°F | Resp 18 | Ht 71.0 in | Wt 235.5 lb

## 2021-05-25 DIAGNOSIS — K5900 Constipation, unspecified: Secondary | ICD-10-CM | POA: Insufficient documentation

## 2021-05-25 DIAGNOSIS — I10 Essential (primary) hypertension: Secondary | ICD-10-CM | POA: Insufficient documentation

## 2021-05-25 DIAGNOSIS — Z7189 Other specified counseling: Secondary | ICD-10-CM

## 2021-05-25 DIAGNOSIS — C3492 Malignant neoplasm of unspecified part of left bronchus or lung: Secondary | ICD-10-CM | POA: Diagnosis not present

## 2021-05-25 DIAGNOSIS — K5720 Diverticulitis of large intestine with perforation and abscess without bleeding: Secondary | ICD-10-CM

## 2021-05-25 DIAGNOSIS — C7931 Secondary malignant neoplasm of brain: Secondary | ICD-10-CM | POA: Diagnosis not present

## 2021-05-25 DIAGNOSIS — Z515 Encounter for palliative care: Secondary | ICD-10-CM | POA: Diagnosis not present

## 2021-05-25 DIAGNOSIS — C3432 Malignant neoplasm of lower lobe, left bronchus or lung: Secondary | ICD-10-CM | POA: Insufficient documentation

## 2021-05-25 DIAGNOSIS — G893 Neoplasm related pain (acute) (chronic): Secondary | ICD-10-CM | POA: Insufficient documentation

## 2021-05-25 DIAGNOSIS — F17210 Nicotine dependence, cigarettes, uncomplicated: Secondary | ICD-10-CM | POA: Insufficient documentation

## 2021-05-25 DIAGNOSIS — C3412 Malignant neoplasm of upper lobe, left bronchus or lung: Secondary | ICD-10-CM | POA: Diagnosis present

## 2021-05-25 LAB — CMP (CANCER CENTER ONLY)
ALT: 24 U/L (ref 0–44)
AST: 40 U/L (ref 15–41)
Albumin: 3.8 g/dL (ref 3.5–5.0)
Alkaline Phosphatase: 161 U/L — ABNORMAL HIGH (ref 38–126)
Anion gap: 10 (ref 5–15)
BUN: 12 mg/dL (ref 6–20)
CO2: 25 mmol/L (ref 22–32)
Calcium: 9.9 mg/dL (ref 8.9–10.3)
Chloride: 104 mmol/L (ref 98–111)
Creatinine: 0.76 mg/dL (ref 0.61–1.24)
GFR, Estimated: >60 mL/min (ref >60)
Glucose, Bld: 141 mg/dL — ABNORMAL HIGH (ref 70–99)
Potassium: 4.3 mmol/L (ref 3.5–5.1)
Sodium: 139 mmol/L (ref 135–145)
Total Bilirubin: 0.5 mg/dL (ref 0.3–1.2)
Total Protein: 7.5 g/dL (ref 6.5–8.1)

## 2021-05-25 LAB — CBC WITH DIFFERENTIAL (CANCER CENTER ONLY)
Abs Immature Granulocytes: 0.03 10*3/uL (ref 0.00–0.07)
Basophils Absolute: 0 10*3/uL (ref 0.0–0.1)
Basophils Relative: 0 %
Eosinophils Absolute: 0 10*3/uL (ref 0.0–0.5)
Eosinophils Relative: 0 %
HCT: 35.1 % — ABNORMAL LOW (ref 39.0–52.0)
Hemoglobin: 11.4 g/dL — ABNORMAL LOW (ref 13.0–17.0)
Immature Granulocytes: 0 %
Lymphocytes Relative: 9 %
Lymphs Abs: 0.8 10*3/uL (ref 0.7–4.0)
MCH: 28.6 pg (ref 26.0–34.0)
MCHC: 32.5 g/dL (ref 30.0–36.0)
MCV: 88.2 fL (ref 80.0–100.0)
Monocytes Absolute: 0.3 10*3/uL (ref 0.1–1.0)
Monocytes Relative: 3 %
Neutro Abs: 7.6 10*3/uL (ref 1.7–7.7)
Neutrophils Relative %: 88 %
Platelet Count: 326 10*3/uL (ref 150–400)
RBC: 3.98 MIL/uL — ABNORMAL LOW (ref 4.22–5.81)
RDW: 14.6 % (ref 11.5–15.5)
WBC Count: 8.7 10*3/uL (ref 4.0–10.5)
nRBC: 0 % (ref 0.0–0.2)

## 2021-05-25 MED ORDER — MORPHINE SULFATE ER 15 MG PO TBCR
15.0000 mg | EXTENDED_RELEASE_TABLET | Freq: Two times a day (BID) | ORAL | 0 refills | Status: DC
  Filled 2021-05-25: qty 60, 30d supply, fill #0

## 2021-05-25 MED ORDER — GABAPENTIN 300 MG PO CAPS
600.0000 mg | ORAL_CAPSULE | Freq: Every day | ORAL | 0 refills | Status: DC
  Filled 2021-05-25: qty 60, 30d supply, fill #0

## 2021-05-25 MED ORDER — OXYCODONE HCL ER 10 MG PO T12A
10.0000 mg | EXTENDED_RELEASE_TABLET | Freq: Two times a day (BID) | ORAL | 0 refills | Status: DC
  Filled 2021-05-25 (×3): qty 60, 30d supply, fill #0

## 2021-05-25 NOTE — Progress Notes (Addendum)
[facility]  [phone] [phone]) [phone]   Name: Raymond [name] Date: 05/25/2021 MRN: [id number]  DOB: 09/14/1960  Patient Care Team: Pcp, No as PCP - General Valrie Hart, RN as Oncology Nurse Navigator (Oncology) Conception Chancy, PsyD (Psychology)    REASON FOR CONSULTATION: Raymond [name] is a 61 y.o. male with medical history including stage IV non-small cell lung cancer, adenocarcinoma of the left lower lobe, mediastinal lymphadenopathy with brain metastasis, s/p 5 cycles of carboplatin, Keytruda, and Alimta, stereotactic radiosurgery to brain mets, hypertension, and GERD.  Recent MRI on (01/21/21) showed significant increase in enhancing metastatic cerebral and cerebellar hemisphere lesions. Now undergoing a total of 10 whole brain radiation treatments. Palliative ask to see for ongoing symptom management support and goals of care.    SOCIAL HISTORY:     reports that he has been smoking cigarettes. He has never used smokeless tobacco. He reports that he does not drink alcohol and does not use drugs.  ADVANCE DIRECTIVES:  None on file. Education provided. Raymond [name] aware his wife, Raymond [name] is his medical decision maker in the setting of no document. He was given an advanced directive packet with plans to complete.   CODE STATUS: Full code   ALLERGIES:  is allergic to benadryl [diphenhydramine].  MEDICATIONS:  Current Outpatient Medications  Medication Sig Dispense Refill   oxyCODONE (OXYCONTIN) 10 mg 12 hr tablet Take 1 tablet (10 mg total) by mouth every 12 (twelve) hours. 60 tablet 0   acetaminophen (TYLENOL) 650 MG CR tablet Take 650 mg by mouth every 8 (eight) hours as needed for pain.     amoxicillin-clavulanate (AUGMENTIN) 875-125 MG tablet Take 1 tablet by mouth every 12 hours for 10 days. 20 tablet 0   Ensure Plus (ENSURE PLUS) LIQD Take 237 mLs by mouth 2 (two) times daily between meals. Chocolate      fluticasone (FLONASE) 50 MCG/ACT nasal spray Place 1 spray into both nostrils daily. 16 g 2   folic acid (FOLVITE) 1 MG tablet Take 1 tablet by mouth daily. 90 tablet 0   gabapentin (NEURONTIN) 300 MG capsule Take 2 capsules (600 mg total) by mouth at bedtime. 60 capsule 0   memantine (NAMENDA) 10 MG tablet Take 1 tablet (10 mg total) by mouth 2 (two) times daily. 60 tablet 4   mirtazapine (REMERON) 30 MG tablet Take 1 tablet (30 mg total) by mouth at bedtime. 30 tablet 0   Multiple Vitamin (MULTIVITAMIN WITH MINERALS) TABS tablet Take 1 tablet by mouth daily. (Patient taking differently: Take 1 tablet by mouth 2 (two) times daily. Ultimate Man)     ondansetron (ZOFRAN) 8 MG tablet Take 1 tablet (8 mg total) by mouth every 8 (eight) hours as needed for nausea or vomiting. 60 tablet 2   oxyCODONE (OXY IR/ROXICODONE) 5 MG immediate release tablet Take 1-2 tablets (5-10 mg total) by mouth every 4 (four) hours as needed for severe pain. 120 tablet 0   prochlorperazine (COMPAZINE) 10 MG tablet Take 1 tablet (10 mg total) by mouth every 6 (six) hours as needed for nausea or vomiting. 90 tablet 2   senna-docusate (SENNA S) 8.6-50 MG tablet Take 2 tablets by mouth daily. 60 tablet 2   vitamin B-12 (CYANOCOBALAMIN) 1000 MCG tablet Take 1,000 mcg by mouth daily.     Wheat Dextrin (BENEFIBER DRINK MIX) PACK Take 1 Package by mouth daily at 6 (six) AM.     No current facility-administered  medications for this visit.    VITAL SIGNS: There were no vitals taken for this visit. There were no vitals filed for this visit.    Estimated body mass index is 32.85 kg/m as calculated from the following:   Height as of an earlier encounter on 05/25/21: 5\' 11"  (1.803 m).   Weight as of an earlier encounter on 05/25/21: 235 lb 8 oz (106.8 kg).   PERFORMANCE STATUS (ECOG) : 2 - Symptomatic, <50% confined to bed  Physical Exam General: NAD, in wheelchair Cardiovascular: RRR Pulmonary: normal breathing pattern   Extremities: no edema, moves all extremity  Skin: warm, dry, flaky, alopecia  Neurological: AAO x4, mood appropriate  IMPRESSION:  Raymond [name] presents today for symptom management follow-up. No family present. No acute distress noted. In a wheelchair. Unfortunately he has several recent hospitalizations in addition to recent findings of cancer progression.   Pain related to neoplasm  Raymond [name] reports his pain has significantly increased over the past several weeks. He has been taking his Oxy IR around the clock with minimal relief. Shares he has not had a good night's rest in over a week due to pain.   States bulk of pain is in his abdominal/pelvic area although he does have some generalized pains. Pain does cause difficulty with ambulating or with activities.   We discussed current regimen. Education provided on the use of long-acting Oxycodone in addition to Oxy IR for breakthrough. He verbalized understanding.   We will continue to closely monitor and make needed adjustments.   Constipation  Is taking Miralax but does forget on some days. Emphasized importance of bowel regimen to prevent further complications and in addition to the use of opioids. Encouraged water intake.    Goals of Care  I empathetically discussed his current illness and recent findings of cancer progression. He is emotional expressing he is not ready to "give up or die!" Emotional support provided. We discussed prognosis long-term and how he would want this to look for him. Although difficult discussions he knows at some point he will face end-of-life but is not ready to accept that currently. He shares his strong [place] beliefs and that "God has more for me to do first!" He wishes to continue to treat the treatable for as long as he is able and there are options.   I discussed the importance of continued conversation with family and their medical providers regarding overall plan of care and treatment options,  ensuring decisions are within the context of the patients values and GOCs.  PLAN: Oxycodone ER 10 mg twice daily (unable to afford) Will send in MS Contin 15 mg Oxy IR 5-10 mg every 4-6 hours as needed for pain. Will continue to closely evaluate. Neurontin 600 mg at bedtime, will continue to evaluate and increase dose for added support.  Miralax daily  Senna-S daily  Ensure daily. Encouraged increased protein and nutritional intake.  Continues to follow with Dr. Nelida Meuse for ongoing support.  Ongoing goals of care discussions and support. He is not open to hospice at this time. He is remaining hopeful and expresses wishes to continue to treat the treatable for as long as he can. Is relying on his strong faith in God.  I will plan to see patient back in 3-4 weeks in collaboration to other oncology appointments.    Patient expressed understanding and was in agreement with this plan. He also understands that He can call the clinic at any time with any questions, concerns,  or complaints.   Number and complexity of problems addressed: 3 HIGH - 1 or more chronic illnesses with SEVERE exacerbation, progression, or side effects of treatment - advanced cancer, pain. Any controlled substances utilized were prescribed in the context of palliative care.  Time Total: 50 min.   Visit consisted of counseling and education dealing with the complex and emotionally intense issues of symptom management and palliative care in the setting of serious and potentially life-threatening illness.Greater than 50%  of this time was spent counseling and coordinating care related to the above assessment and plan.  [name], AGPCNP-BC  Palliative Medicine Team/[facility]

## 2021-05-25 NOTE — Progress Notes (Signed)
Nutrition Follow-up:  Patient with NSCLC with osseous and brain metastasis. Recent imaging revealed progression as well as new metastatic brain lesions. S/p SRS on 05/20/21. Patient is pending start of gemcitabine (first treatment planned 5/31)  5/10-5/15 - hospital admission for sigmoid diverticulitis with abscess, drain removed 5/11  Met with patient in clinic. During recent hospitalization, patient reports not having an appetite nor care much for the food. He has been eating a variety of foods now that he is at home.  Recalls eating a bologna sandwich around 10 AM. This sticks with him all day. Patient eats supper with his wife around ~5 PM (wings, chicken, pork chops, rice/gravy, peas, greens, potatoes). His wife has him chew each bite 30 times before swallowing. Recently food has started tasting "nasty" again. This happens 10-15 minutes into meal. Oral thrush noted on PA-C exam. Patient is drinking 2 Ensure, reports intermittent nausea after drinking. He had one episode of vomiting after consuming Ensure last night. Patient is taking nausea medication every 8 hours as needed. He is having daily regular bowel movements.  Medications: oxycodone, gabapentin, remeron  Labs: glucose 141  Anthropometrics: Weight 235 lb 8 oz today decreased 2.5% (6 lbs) in 12 days; 6% (15 lbs) in the last 2 months; significant   5/11 - 241 lb 13.5 oz 4/11 - 248 lb 8 oz 3/21 - 250 lb 4.8 oz    NUTRITION DIAGNOSIS: Inadequate oral intake ongoing     INTERVENTION:  Educated on eating small meals/snacks q2h instead of one meal in the evening. Explained pt likely to eat more while experienced less "nasty" taste given reported ~10 minutes before noticing change. Worked with patient to create 2 hour meal/snack schedule including high calorie/high protein foods he is tolerating well Patient will start baking soda salt water rinses several times daily - he has recipe Continue drinking Ensure Plus/equivalent, recommend  3/day Suggested cutting supplement with milk to improve toleration One complimentary case of Ensure Plus High Protein provided  Reviewed tips for nausea - pt has handout  Patient has contact information     MONITORING, EVALUATION, GOAL: weight trends, intake    NEXT VISIT: Tuesday June 13 in infusion

## 2021-05-25 NOTE — Progress Notes (Signed)
We received contact from medical oncology and the patient is a candidate for additional radiotherapy to the pelvis. Dr. Lisbeth Renshaw would recommend 3 weeks of radiation to the pelvis as he will restart chemotherapy next week. The patient will be contacted to coordinate treatment planning by our simulation department and he will sign written consent to proceed at that time.

## 2021-05-25 NOTE — Progress Notes (Signed)
DISCONTINUE ON PATHWAY REGIMEN - Non-Small Cell Lung     A cycle is every 21 days:     Ramucirumab      Docetaxel   **Always confirm dose/schedule in your pharmacy ordering system**  REASON: Disease Progression PRIOR TREATMENT: [id number]: Docetaxel 75 mg/m2 + Ramucirumab 10 mg/kg q21 Days Until Progression or Unacceptable Toxicity TREATMENT RESPONSE: Progressive Disease (PD)  START OFF PATHWAY REGIMEN - Non-Small Cell Lung   [id number]:Gemcitabine 1,000 mg/m2 D1, 8  q21 Days:   A cycle is every 21 days:     Gemcitabine   **Always confirm dose/schedule in your pharmacy ordering system**  Patient Characteristics: Stage IV Metastatic, Nonsquamous, Molecular Analysis Completed, Molecular Alteration Present and Targeted Therapy Exhausted OR EGFR Exon 20+ or KRAS G12C+ or HER2+ Present and No Prior Chemo/Immunotherapy OR No Alteration Present, Third Line -  Chemotherapy/Immunotherapy, PS = 0, 1 Therapeutic Status: Stage IV Metastatic Histology: Nonsquamous Cell Broad Molecular Profiling Status: Engineer, manufacturing Analysis Results: No Alteration Present ECOG Performance Status: 1 Chemotherapy/Immunotherapy Line of Therapy: Third Line Chemotherapy/Immunotherapy Intent of Therapy: Non-Curative / Palliative Intent, Discussed with Patient

## 2021-05-25 NOTE — Addendum Note (Signed)
Addended by: Jimmy Footman on: 05/25/2021 04:11 PM   Modules accepted: Orders

## 2021-05-25 NOTE — Progress Notes (Signed)
ON PATHWAY REGIMEN - Non-Small Cell Lung  No Change  Continue With Treatment as Ordered.  Original Decision Date/Time: 02/15/2021 22:10     A cycle is every 21 days:     Ramucirumab      Docetaxel   **Always confirm dose/schedule in your pharmacy ordering system**  Patient Characteristics: Stage IV Metastatic, Nonsquamous, Molecular Analysis Completed, Molecular Alteration Present and Targeted Therapy Exhausted OR EGFR Exon 20+ or KRAS G12C+ or HER2+ Present and No Prior Chemo/Immunotherapy OR No Alteration Present, Second Line -  Chemotherapy/Immunotherapy, PS = 0, 1, No Prior PD-1/PD-L1  Inhibitor or Prior PD-1/PD-L1 Inhibitor + Chemotherapy, and Not a Candidate for Immunotherapy Therapeutic Status: Stage IV Metastatic Histology: Nonsquamous Cell Broad Molecular Profiling Status: Engineer, manufacturing Analysis Results: No Alteration Present ECOG Performance Status: 1 Chemotherapy/Immunotherapy Line of Therapy: Second Line Chemotherapy/Immunotherapy Immunotherapy Candidate Status: Not a Candidate for Immunotherapy Prior Immunotherapy Status: Prior PD-1/PD-L1 Inhibitor + Chemotherapy Intent of Therapy: Non-Curative / Palliative Intent, Discussed with Patient

## 2021-05-25 NOTE — Patient Instructions (Addendum)
The new treatment is a chemotherapy drug called Gemzar. This is given once a week for 2 weeks in a row and one week off every 3 weeks.  -I will reach out to [facility] and Dr. Lisbeth Renshaw to see what they can do with radiating the spots in the bone that hurt.  -I will refer you to GI for the recurrent diverticulitis -Pick up mucinex for your mucus production

## 2021-05-26 ENCOUNTER — Encounter: Payer: Self-pay | Admitting: General Practice

## 2021-05-26 ENCOUNTER — Encounter: Payer: Self-pay | Admitting: Licensed Clinical Social Worker

## 2021-05-26 ENCOUNTER — Other Ambulatory Visit (HOSPITAL_COMMUNITY): Payer: Self-pay

## 2021-05-26 ENCOUNTER — Other Ambulatory Visit: Payer: Self-pay | Admitting: Radiation Therapy

## 2021-05-26 DIAGNOSIS — C7931 Secondary malignant neoplasm of brain: Secondary | ICD-10-CM

## 2021-05-26 DIAGNOSIS — C3492 Malignant neoplasm of unspecified part of left bronchus or lung: Secondary | ICD-10-CM

## 2021-05-26 NOTE — Progress Notes (Signed)
Referral sent to Pattricia Boss, MD at [facility].  Referral Progress note Lab report CT Abd/Pelvis Facesheet  PH: [phone] FX: [phone]  Confirmation was received.

## 2021-05-26 NOTE — Progress Notes (Signed)
[facility] Spiritual Care Note  Reached Mr Raymond [name] by phone for follow-up check-in. He notes that he is having such significant hip/pelvic pain that he is having trouble walking, "but I still get up and do things because I'm trusting God." His faith is a buoy in the midst of pain and distress, keeping him uplifted and focused.   Provided empathic listening and spiritual/emotional support. Mr Ruggieri has my direct number, and we also plan to follow up when he is on campus for treatment.   [place], [place], [facility] Pager [phone] Voicemail [phone]

## 2021-05-26 NOTE — Progress Notes (Signed)
  Radiation Oncology         [phone] ________________________________  Name: Raymond [name] MRN: [id number]  Date: 05/20/2021  DOB: 1960-11-30  End of Treatment Note  Diagnosis:   Stage IV, cT2bN2M1c, NSCLC, adenocarcinoma of the left lower lobe     Indication for treatment:  palliative       Radiation treatment dates:   05/20/21    Site/dose:    Each site was treated to 20 Gy in 1 fraction with SRS Treatment with ExacTrac PTV_15_RtThallamus_69mm PTV_16_LtThalamus_19mm PTV_17_SupCerebellVerm_23mm  Narrative: The patient tolerated radiation treatment well.   There were no signs of acute toxicity after treatment.  Plan: The patient will receive a call in about one month from the radiation oncology department. He will continue follow up with Dr. Mickeal Skinner and Dr. Julien Nordmann as well.      Carola Rhine, PAC

## 2021-05-26 NOTE — Op Note (Signed)
  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  I participated closely in the radiation treatment planning afterwards. The patient underwent planning CT which was fused to 3T high resolution MRI with 1 mm axial slices.  These images were fused on the planning system.  We contoured the gross target volumes and subsequently expanded this to yield the Planning Target Volume. I actively participated in the planning process.  I helped to define and review the target contours and also the contours of the optic pathway, eyes, brainstem and selected nearby organs at risk.  All the dose constraints for critical structures were reviewed and compared to AAPM Task Group 101.  The prescription dose conformity was reviewed.  I approved the plan electronically.    Accordingly, Raymond [name] was brought to the TrueBeam stereotactic radiation treatment linac and placed in the custom immobilization mask.  The patient was aligned according to the IR fiducial markers with BrainLab Exactrac, then orthogonal x-rays were used in ExacTrac with the 6DOF robotic table and the shifts were made to align the patient  Raymond [name] received stereotactic radiosurgery uneventfully.    Lesions treated:  3  Right thalamus Left thalamus Vermis  Complex lesions treated:  0 (>3.5 cm, <14mm of optic path, or within the brainstem)   The detailed description of the procedure is recorded in the radiation oncology procedure note.  I was present for the duration of the procedure.  DISPOSITION:   Following delivery, the patient was transported to nursing in stable condition and monitored for possible acute effects to be discharged to home in stable condition with follow-up in one month.  Jairo Ben, MD 05/26/2021 4:31 PM

## 2021-05-27 ENCOUNTER — Ambulatory Visit: Payer: 59

## 2021-05-27 ENCOUNTER — Encounter: Payer: Self-pay | Admitting: Internal Medicine

## 2021-05-27 NOTE — Progress Notes (Signed)
[facility] CSW Progress Note  Holiday representative met with patient to assist with Medicaid benefits.  Multiple phone calls made on pt's behalf to both Medicare and Medicaid.  CSW informed by Medicare that pt had Medicare benefits previously following an arm injury.  Pt's Part D coverage was discontinued in 2017 and pt's Part A coverage was discontinued in 2021.  When pt's prescriptions are sent to the pharmacy the pharmacy is receiving a message that pt does not have Medicaid prescription coverage and needs to utilize his Part D Medicare benefits.  CSW spoke w/ a Librarian, academic at [employer] (Ms Sherren Mocha) who informed Probation officer the pt had 10 days following discontinuation of his Medicare coverage to file paperwork informing Medicaid, which was not done.  According to Ms Sherren Mocha it is difficult to change pt's status in the state system at this stage.  Ms Sherren Mocha to check with her superiors for direction as to how to remove the block for pt's Medicaid prescription coverage.  CSW awaiting a call back from Ms Sherren Mocha.    Henriette Combs, LCSW    Patient is participating in a Managed Medicaid Plan:  Yes

## 2021-06-01 ENCOUNTER — Inpatient Hospital Stay: Payer: Medicaid Other | Admitting: Licensed Clinical Social Worker

## 2021-06-01 ENCOUNTER — Other Ambulatory Visit: Payer: Self-pay

## 2021-06-01 ENCOUNTER — Inpatient Hospital Stay: Payer: Medicaid Other

## 2021-06-01 ENCOUNTER — Telehealth: Payer: Self-pay

## 2021-06-01 ENCOUNTER — Other Ambulatory Visit: Payer: 59

## 2021-06-01 DIAGNOSIS — C3492 Malignant neoplasm of unspecified part of left bronchus or lung: Secondary | ICD-10-CM

## 2021-06-01 NOTE — Progress Notes (Signed)
[facility] CSW Progress Note  Holiday representative met with patient to assist with completing additional paperwork sent to pt from the Department of Social Services to complete th assessment for pt's eligibility for SSD.  Paperwork completed and to be mailed by patient.  Henriette Combs, LCSW    Patient is participating in a Managed Medicaid Plan:  Yes

## 2021-06-01 NOTE — Telephone Encounter (Addendum)
Fax received from [facility] advising pt will need to contact their accounts department.  I've called them and they have agreed to see the pt on 06/22/21 at 11am. Pt has been advised of this and states he has written the appt information down.

## 2021-06-02 ENCOUNTER — Other Ambulatory Visit: Payer: Self-pay | Admitting: Radiation Oncology

## 2021-06-02 ENCOUNTER — Ambulatory Visit
Admission: RE | Admit: 2021-06-02 | Discharge: 2021-06-02 | Disposition: A | Discharge status: Home / Self Care | Payer: Medicaid Other | Source: Ambulatory Visit | Attending: Radiation Oncology | Admitting: Radiation Oncology

## 2021-06-02 ENCOUNTER — Encounter: Payer: Self-pay | Admitting: Internal Medicine

## 2021-06-02 ENCOUNTER — Other Ambulatory Visit (HOSPITAL_COMMUNITY): Payer: Self-pay

## 2021-06-02 ENCOUNTER — Inpatient Hospital Stay: Payer: Medicaid Other

## 2021-06-02 DIAGNOSIS — C7931 Secondary malignant neoplasm of brain: Secondary | ICD-10-CM | POA: Diagnosis present

## 2021-06-02 DIAGNOSIS — Z510 Encounter for antineoplastic radiation therapy: Secondary | ICD-10-CM | POA: Diagnosis not present

## 2021-06-02 DIAGNOSIS — C3492 Malignant neoplasm of unspecified part of left bronchus or lung: Secondary | ICD-10-CM | POA: Diagnosis present

## 2021-06-02 MED ORDER — OXYCODONE HCL 10 MG PO TABS
5.0000 mg | ORAL_TABLET | ORAL | 0 refills | Status: DC | PRN
  Filled 2021-06-02: qty 120, 20d supply, fill #0

## 2021-06-02 MED ORDER — OXYCODONE HCL 5 MG PO TABS
5.0000 mg | ORAL_TABLET | ORAL | 0 refills | Status: DC | PRN
  Filled 2021-06-02: qty 120, 10d supply, fill #0

## 2021-06-03 ENCOUNTER — Inpatient Hospital Stay: Payer: Medicaid Other

## 2021-06-03 ENCOUNTER — Other Ambulatory Visit: Payer: Self-pay

## 2021-06-03 VITALS — BP 128/88 | HR 88 | Temp 98.1°F | Resp 18 | Wt 234.2 lb

## 2021-06-03 DIAGNOSIS — Z510 Encounter for antineoplastic radiation therapy: Secondary | ICD-10-CM | POA: Diagnosis present

## 2021-06-03 DIAGNOSIS — C3492 Malignant neoplasm of unspecified part of left bronchus or lung: Secondary | ICD-10-CM

## 2021-06-03 DIAGNOSIS — R071 Chest pain on breathing: Secondary | ICD-10-CM | POA: Insufficient documentation

## 2021-06-03 DIAGNOSIS — R918 Other nonspecific abnormal finding of lung field: Secondary | ICD-10-CM | POA: Insufficient documentation

## 2021-06-03 DIAGNOSIS — C3412 Malignant neoplasm of upper lobe, left bronchus or lung: Secondary | ICD-10-CM | POA: Insufficient documentation

## 2021-06-03 DIAGNOSIS — Z5111 Encounter for antineoplastic chemotherapy: Secondary | ICD-10-CM | POA: Insufficient documentation

## 2021-06-03 DIAGNOSIS — C7931 Secondary malignant neoplasm of brain: Secondary | ICD-10-CM | POA: Insufficient documentation

## 2021-06-03 DIAGNOSIS — Y929 Unspecified place or not applicable: Secondary | ICD-10-CM | POA: Diagnosis not present

## 2021-06-03 DIAGNOSIS — Z923 Personal history of irradiation: Secondary | ICD-10-CM | POA: Insufficient documentation

## 2021-06-03 DIAGNOSIS — W101XXA Fall (on)(from) sidewalk curb, initial encounter: Secondary | ICD-10-CM | POA: Insufficient documentation

## 2021-06-03 DIAGNOSIS — C3432 Malignant neoplasm of lower lobe, left bronchus or lung: Secondary | ICD-10-CM | POA: Insufficient documentation

## 2021-06-03 DIAGNOSIS — R0781 Pleurodynia: Secondary | ICD-10-CM | POA: Insufficient documentation

## 2021-06-03 DIAGNOSIS — R102 Pelvic and perineal pain: Secondary | ICD-10-CM | POA: Insufficient documentation

## 2021-06-03 DIAGNOSIS — I10 Essential (primary) hypertension: Secondary | ICD-10-CM | POA: Insufficient documentation

## 2021-06-03 DIAGNOSIS — C7951 Secondary malignant neoplasm of bone: Secondary | ICD-10-CM | POA: Insufficient documentation

## 2021-06-03 DIAGNOSIS — Y939 Activity, unspecified: Secondary | ICD-10-CM | POA: Diagnosis not present

## 2021-06-03 LAB — CBC WITH DIFFERENTIAL (CANCER CENTER ONLY)
Abs Immature Granulocytes: 0.05 10*3/uL (ref 0.00–0.07)
Basophils Absolute: 0 10*3/uL (ref 0.0–0.1)
Basophils Relative: 0 %
Eosinophils Absolute: 0.6 10*3/uL — ABNORMAL HIGH (ref 0.0–0.5)
Eosinophils Relative: 6 %
HCT: 34.3 % — ABNORMAL LOW (ref 39.0–52.0)
Hemoglobin: 11.1 g/dL — ABNORMAL LOW (ref 13.0–17.0)
Immature Granulocytes: 1 %
Lymphocytes Relative: 15 %
Lymphs Abs: 1.5 10*3/uL (ref 0.7–4.0)
MCH: 28.8 pg (ref 26.0–34.0)
MCHC: 32.4 g/dL (ref 30.0–36.0)
MCV: 89.1 fL (ref 80.0–100.0)
Monocytes Absolute: 1.1 10*3/uL — ABNORMAL HIGH (ref 0.1–1.0)
Monocytes Relative: 11 %
Neutro Abs: 6.9 10*3/uL (ref 1.7–7.7)
Neutrophils Relative %: 67 %
Platelet Count: 179 10*3/uL (ref 150–400)
RBC: 3.85 MIL/uL — ABNORMAL LOW (ref 4.22–5.81)
RDW: 14.6 % (ref 11.5–15.5)
WBC Count: 10.1 10*3/uL (ref 4.0–10.5)
nRBC: 0 % (ref 0.0–0.2)

## 2021-06-03 LAB — CMP (CANCER CENTER ONLY)
ALT: 15 U/L (ref 0–44)
AST: 28 U/L (ref 15–41)
Albumin: 3.8 g/dL (ref 3.5–5.0)
Alkaline Phosphatase: 173 U/L — ABNORMAL HIGH (ref 38–126)
Anion gap: 9 (ref 5–15)
BUN: 24 mg/dL — ABNORMAL HIGH (ref 6–20)
CO2: 30 mmol/L (ref 22–32)
Calcium: 10 mg/dL (ref 8.9–10.3)
Chloride: 102 mmol/L (ref 98–111)
Creatinine: 0.81 mg/dL (ref 0.61–1.24)
GFR, Estimated: >60 mL/min (ref >60)
Glucose, Bld: 121 mg/dL — ABNORMAL HIGH (ref 70–99)
Potassium: 3.4 mmol/L — ABNORMAL LOW (ref 3.5–5.1)
Sodium: 141 mmol/L (ref 135–145)
Total Bilirubin: 0.5 mg/dL (ref 0.3–1.2)
Total Protein: 6.8 g/dL (ref 6.5–8.1)

## 2021-06-03 MED ORDER — PROCHLORPERAZINE MALEATE 10 MG PO TABS
10.0000 mg | ORAL_TABLET | Freq: Once | ORAL | Status: AC
  Administered 2021-06-03: 10 mg via ORAL

## 2021-06-03 MED ORDER — SODIUM CHLORIDE 0.9 % IV SOLN: Freq: Once | INTRAVENOUS | Status: AC

## 2021-06-03 MED ORDER — SODIUM CHLORIDE 0.9 % IV SOLN
1000.0000 mg/m2 | Freq: Once | INTRAVENOUS | Status: AC
  Administered 2021-06-03: 2318 mg via INTRAVENOUS
  Filled 2021-06-03: qty 60.96

## 2021-06-03 NOTE — Patient Instructions (Signed)
[facility]  Discharge Instructions: Thank you for choosing [facility] to provide your oncology and hematology care.   If you have a lab appointment with the [facility], please go directly to the [facility] and check in at the registration area.   Wear comfortable clothing and clothing appropriate for easy access to any Portacath or PICC line.   We strive to give you quality time with your provider. You may need to reschedule your appointment if you arrive late (15 or more minutes).  Arriving late affects you and other patients whose appointments are after yours.  Also, if you miss three or more appointments without notifying the office, you may be dismissed from the clinic at the provider's discretion.      For prescription refill requests, have your pharmacy contact our office and allow 72 hours for refills to be completed.    Today you received the following chemotherapy and/or immunotherapy agents: Gemcitabine (Gemzar)    To help prevent nausea and vomiting after your treatment, we encourage you to take your nausea medication as directed.  BELOW ARE SYMPTOMS THAT SHOULD BE REPORTED IMMEDIATELY: *FEVER GREATER THAN 100.4 F (38 C) OR HIGHER *CHILLS OR SWEATING *NAUSEA AND VOMITING THAT IS NOT CONTROLLED WITH YOUR NAUSEA MEDICATION *UNUSUAL SHORTNESS OF BREATH *UNUSUAL BRUISING OR BLEEDING *URINARY PROBLEMS (pain or burning when urinating, or frequent urination) *BOWEL PROBLEMS (unusual diarrhea, constipation, pain near the anus) TENDERNESS IN MOUTH AND THROAT WITH OR WITHOUT PRESENCE OF ULCERS (sore throat, sores in mouth, or a toothache) UNUSUAL RASH, SWELLING OR PAIN  UNUSUAL VAGINAL DISCHARGE OR ITCHING   Items with * indicate a potential emergency and should be followed up as soon as possible or go to the Emergency Department if any problems should occur.  Please show the CHEMOTHERAPY ALERT CARD or IMMUNOTHERAPY ALERT CARD at  check-in to the Emergency Department and triage nurse.  Should you have questions after your visit or need to cancel or reschedule your appointment, please contact [facility]  Dept: [phone]  and follow the prompts.  Office hours are 8:00 a.m. to 4:30 p.m. Monday - Friday. Please note that voicemails left after 4:00 p.m. may not be returned until the following business day.  We are closed weekends and major holidays. You have access to a nurse at all times for urgent questions. Please call the main number to the clinic Dept: [phone] and follow the prompts.   For any non-urgent questions, you may also contact your provider using MyChart. We now offer e-Visits for anyone 64 and older to request care online for non-urgent symptoms. For details visit [email or web address].   Also download the MyChart app! Go to the app store, search "MyChart", open the app, select [facility], and log in with your MyChart username and password.  Due to Covid, a mask is required upon entering the hospital/clinic. If you do not have a mask, one will be given to you upon arrival. For doctor visits, patients may have 1 support Arnetha Silverthorne aged 59 or older with them. For treatment visits, patients cannot have anyone with them due to current Covid guidelines and our immunocompromised population.   Gemcitabine injection What is this medication? GEMCITABINE (jem SYE ta been) is a chemotherapy drug. This medicine is used to treat many types of cancer like breast cancer, lung cancer, pancreatic cancer, and ovarian cancer. This medicine may be used for other purposes; ask your health care provider or pharmacist  if you have questions. COMMON BRAND NAME(S): Gemzar, Infugem What should I tell my care team before I take this medication? They need to know if you have any of these conditions: blood disorders infection kidney disease liver disease lung or breathing disease, like asthma recent or  ongoing radiation therapy an unusual or allergic reaction to gemcitabine, other chemotherapy, other medicines, foods, dyes, or preservatives pregnant or trying to get pregnant breast-feeding How should I use this medication? This drug is given as an infusion into a vein. It is administered in a hospital or clinic by a specially trained health care professional. Talk to your pediatrician regarding the use of this medicine in children. Special care may be needed. Overdosage: If you think you have taken too much of this medicine contact a poison control center or emergency room at once. NOTE: This medicine is only for you. Do not share this medicine with others. What if I miss a dose? It is important not to miss your dose. Call your doctor or health care professional if you are unable to keep an appointment. What may interact with this medication? medicines to increase blood counts like filgrastim, pegfilgrastim, sargramostim some other chemotherapy drugs like cisplatin vaccines Talk to your doctor or health care professional before taking any of these medicines: acetaminophen aspirin ibuprofen ketoprofen naproxen This list may not describe all possible interactions. Give your health care provider a list of all the medicines, herbs, non-prescription drugs, or dietary supplements you use. Also tell them if you smoke, drink alcohol, or use illegal drugs. Some items may interact with your medicine. What should I watch for while using this medication? Visit your doctor for checks on your progress. This drug may make you feel generally unwell. This is not uncommon, as chemotherapy can affect healthy cells as well as cancer cells. Report any side effects. Continue your course of treatment even though you feel ill unless your doctor tells you to stop. In some cases, you may be given additional medicines to help with side effects. Follow all directions for their use. Call your doctor or health care  professional for advice if you get a fever, chills or sore throat, or other symptoms of a cold or flu. Do not treat yourself. This drug decreases your body's ability to fight infections. Try to avoid being around people who are sick. This medicine may increase your risk to bruise or bleed. Call your doctor or health care professional if you notice any unusual bleeding. Be careful brushing and flossing your teeth or using a toothpick because you may get an infection or bleed more easily. If you have any dental work done, tell your dentist you are receiving this medicine. Avoid taking products that contain aspirin, acetaminophen, ibuprofen, naproxen, or ketoprofen unless instructed by your doctor. These medicines may hide a fever. Do not become pregnant while taking this medicine or for 6 months after stopping it. Women should inform their doctor if they wish to become pregnant or think they might be pregnant. Men should not father a child while taking this medicine and for 3 months after stopping it. There is a potential for serious side effects to an unborn child. Talk to your health care professional or pharmacist for more information. Do not breast-feed an infant while taking this medicine or for at least 1 week after stopping it. Men should inform their doctors if they wish to father a child. This medicine may lower sperm counts. Talk with your doctor or health care professional  if you are concerned about your fertility. What side effects may I notice from receiving this medication? Side effects that you should report to your doctor or health care professional as soon as possible: allergic reactions like skin rash, itching or hives, swelling of the face, lips, or tongue breathing problems pain, redness, or irritation at site where injected signs and symptoms of a dangerous change in heartbeat or heart rhythm like chest pain; dizziness; fast or irregular heartbeat; palpitations; feeling faint or  lightheaded, falls; breathing problems signs of decreased platelets or bleeding - bruising, pinpoint red spots on the skin, black, tarry stools, blood in the urine signs of decreased red blood cells - unusually weak or tired, feeling faint or lightheaded, falls signs of infection - fever or chills, cough, sore throat, pain or difficulty passing urine signs and symptoms of kidney injury like trouble passing urine or change in the amount of urine signs and symptoms of liver injury like dark yellow or brown urine; general ill feeling or flu-like symptoms; light-colored stools; loss of appetite; nausea; right upper belly pain; unusually weak or tired; yellowing of the eyes or skin swelling of ankles, feet, hands Side effects that usually do not require medical attention (report to your doctor or health care professional if they continue or are bothersome): constipation diarrhea hair loss loss of appetite nausea rash vomiting This list may not describe all possible side effects. Call your doctor for medical advice about side effects. You may report side effects to FDA at 1-800-FDA-1088. Where should I keep my medication? This drug is given in a hospital or clinic and will not be stored at home. NOTE: This sheet is a summary. It may not cover all possible information. If you have questions about this medicine, talk to your doctor, pharmacist, or health care provider.  2023 Elsevier/Gold Standard (2017-03-15 00:00:00)

## 2021-06-04 ENCOUNTER — Telehealth: Payer: Self-pay

## 2021-06-04 ENCOUNTER — Telehealth: Payer: Self-pay | Admitting: Medical Oncology

## 2021-06-04 DIAGNOSIS — Z510 Encounter for antineoplastic radiation therapy: Secondary | ICD-10-CM | POA: Diagnosis not present

## 2021-06-04 NOTE — Telephone Encounter (Signed)
Returned call . I gave pt his appts.

## 2021-06-04 NOTE — Telephone Encounter (Signed)
LM for patient that this nurse was calling to see how they were doing after their treatment. Please call back to Dr. Mohamed's nurse at [phone] if they have any questions or concerns regarding the treatment.  

## 2021-06-04 NOTE — Progress Notes (Signed)
[name] OFFICE PROGRESS NOTE  Pcp, No No address on file  DIAGNOSIS: Stage IV (T2b, N2, M1c) non-small cell lung cancer, adenocarcinoma presented with left upper lobe lung mass in addition to left hilar and mediastinal lymphadenopathy diagnosed in September 2022. He also has metastatic disease to the brain.    Molecular Studies: No actionable mutations on tissue tested by [facility]  PD-L1 expression 10%.    PRIOR THERAPY:  1) SRS to the metastatic brain lesions under the care of Dr. Lisbeth Renshaw Scheduled for 10/21/20 2) Whole brain radiation under the care of Dr. Lisbeth Renshaw. Completed on 02/10/21 3)  Carboplatin for an AUC of 5, Alimta 500 mg per metered squared, Keytruda 200 mg IV every 3 weeks. First dose on 10/12/20. Status post 5 cycles. Discontinued due to disease progression   4) SRS to the metastatic brain lesion under the care of Dr. Lisbeth Renshaw on 05/20/2021. 5)  Docetaxel and Cyramaza IV every 3 weeks with neulasta support.  Status post 3 cycles.  Starting from cycle #2 we will discontinue Cyramza because of the recent diverticular abscess and perforation.   CURRENT THERAPY:  1) Gemcitabine 1000 mg per metered squared on days 1 and 8 IV every 3 weeks.  First dose expected next week on 06/02/21.  2) palliative radiation to the pelvis under the care of radiation oncology/Dr. Lisbeth Renshaw.  Last dose expected on 06/25/2021    INTERVAL HISTORY: Raymond [name] 61 y.o. male returns to the clinic today for follow-up visit.  The patient has a very complicated medical history.  He is hospitalized several times for recurrent episodes of diverticulitis.  He also has a abdominal abscess for which he previously had a drain placed.  He was referred to gastroenterology for his recurrent diverticulitis to follow-up and establish care on an outpatient basis.   Otherwise, the patient recently was found to have evidence of disease progression.  He is currently undergoing palliative radiation under the  care of Dr. Lisbeth Renshaw and Bryson Ha for the painful pelvic lesion.  His systemic treatment was also changed to gemcitabine he underwent his first cycle of gemcitabine last week and he tolerated it fair except he had a few episodes of nausea/vomiting.   On Monday of this week, he fell at home. Every since that time, he has had significant right lower rib/RUQ pain. He states this is constant. He had an Xray which did not show any fractures. Today, he is leaning over his wheelchair to find relief of his pain. He is also tachypneic. He reports he is having a hard time with shortness of breath because whenever he takes a deep breath, he has significant pain. He denies hemoptysis or cough. Denies any bruising or bleeding of the skin. He reports he has been having night sweats daily since Monday.  He denies any fevers.  He denies any jaundice or itching.  He continues to have a similar poor appetite but he has been trying to eat.  He denies any diarrhea or constipation.  Denies any blood in the stool.  Her pain management, he is prescribed MS Contin and oxycodone for breakthrough pain.    He is here today for evaluation and repeat blood work before starting day 8 cycle 1.    MEDICAL HISTORY: Past Medical History:  Diagnosis Date   Arthritis    Baker's cyst    right   Diverticulitis    GERD (gastroesophageal reflux disease)    Heart attack ([place]) 01/04/2004   History of  hiatal hernia    Hypertension    Malignant neoplasm of upper lobe of left lung ([facility]) 09/24/2020   Pneumonia    as a child   Sleep apnea    no longer on Cpap    ALLERGIES:  is allergic to benadryl [diphenhydramine].  MEDICATIONS:  Current Outpatient Medications  Medication Sig Dispense Refill   acetaminophen (TYLENOL) 650 MG CR tablet Take 650 mg by mouth every 8 (eight) hours as needed for pain.     Ensure Plus (ENSURE PLUS) LIQD Take 237 mLs by mouth 2 (two) times daily between meals. Chocolate     fluticasone (FLONASE) 50 MCG/ACT  nasal spray Place 1 spray into both nostrils daily. 16 g 2   folic acid (FOLVITE) 1 MG tablet Take 1 tablet by mouth daily. 90 tablet 0   gabapentin (NEURONTIN) 300 MG capsule Take 2 capsules (600 mg total) by mouth at bedtime. 60 capsule 0   memantine (NAMENDA) 10 MG tablet Take 1 tablet (10 mg total) by mouth 2 (two) times daily. 60 tablet 4   mirtazapine (REMERON) 30 MG tablet Take 1 tablet (30 mg total) by mouth at bedtime. 30 tablet 0   morphine (MS CONTIN) 15 MG 12 hr tablet Take 1 tablet by mouth every 12  hours. 60 tablet 0   Multiple Vitamin (MULTIVITAMIN WITH MINERALS) TABS tablet Take 1 tablet by mouth daily. (Patient taking differently: Take 1 tablet by mouth 2 (two) times daily. [employer] Man)     ondansetron (ZOFRAN) 8 MG tablet Take 1 tablet (8 mg total) by mouth every 8 (eight) hours as needed for nausea or vomiting. 60 tablet 2   Oxycodone HCl 10 MG TABS Take 1/2 to 1 tablet (5-10 mg total) by mouth every 4 (four) hours as needed for severe pain. 120 tablet 0   prochlorperazine (COMPAZINE) 10 MG tablet Take 1 tablet (10 mg total) by mouth every 6 (six) hours as needed for nausea or vomiting. 90 tablet 2   senna-docusate (SENNA S) 8.6-50 MG tablet Take 2 tablets by mouth daily. 60 tablet 2   vitamin B-12 (CYANOCOBALAMIN) 1000 MCG tablet Take 1,000 mcg by mouth daily.     Wheat Dextrin (BENEFIBER DRINK MIX) PACK Take 1 Package by mouth daily at 6 (six) AM.     No current facility-administered medications for this visit.    SURGICAL HISTORY:  Past Surgical History:  Procedure Laterality Date   BIOPSY  01/26/2021   Procedure: BIOPSY;  Surgeon: Ronnette Juniper, MD;  Location: [facility] ENDOSCOPY;  Service: Gastroenterology;;   BRONCHIAL BIOPSY  09/14/2020   Procedure: BRONCHIAL BIOPSIES;  Surgeon: Collene Gobble, MD;  Location: [facility] ENDOSCOPY;  Service: Cardiopulmonary;;   BRONCHIAL BRUSHINGS  09/14/2020   Procedure: BRONCHIAL BRUSHINGS;  Surgeon: Collene Gobble, MD;  Location: [facility];   Service: Cardiopulmonary;;   BRONCHIAL NEEDLE ASPIRATION BIOPSY  09/14/2020   Procedure: BRONCHIAL NEEDLE ASPIRATION BIOPSIES;  Surgeon: Collene Gobble, MD;  Location: [facility];  Service: Cardiopulmonary;;   CARDIAC CATHETERIZATION     CARPAL TUNNEL RELEASE     right   COLONOSCOPY WITH PROPOFOL N/A 01/26/2021   Procedure: COLONOSCOPY WITH PROPOFOL;  Surgeon: Ronnette Juniper, MD;  Location: [facility] ENDOSCOPY;  Service: Gastroenterology;  Laterality: N/A;   FIDUCIAL MARKER PLACEMENT  09/14/2020   Procedure: FIDUCIAL MARKER PLACEMENT;  Surgeon: Collene Gobble, MD;  Location: [facility] ENDOSCOPY;  Service: Cardiopulmonary;;   HAND SURGERY     left   IR CATHETER TUBE CHANGE  03/19/2021  IR CATHETER TUBE CHANGE  04/30/2021   IR FLUORO RM 30-60 MIN  03/18/2021   IR RADIOLOGIST EVAL & MGMT  04/07/2021   KNEE SURGERY     left   VIDEO BRONCHOSCOPY N/A 09/14/2020   Procedure: ROBOTIC VIDEO BRONCHOSCOPY WITH FLUORO;  Surgeon: Collene Gobble, MD;  Location: [facility];  Service: Cardiopulmonary;  Laterality: N/A;   VIDEO BRONCHOSCOPY WITH ENDOBRONCHIAL ULTRASOUND N/A 09/14/2020   Procedure: VIDEO BRONCHOSCOPY WITH ENDOBRONCHIAL ULTRASOUND;  Surgeon: Collene Gobble, MD;  Location: [facility];  Service: Cardiopulmonary;  Laterality: N/A;   VIDEO BRONCHOSCOPY WITH RADIAL ENDOBRONCHIAL ULTRASOUND  09/14/2020   Procedure: VIDEO BRONCHOSCOPY WITH RADIAL ENDOBRONCHIAL ULTRASOUND;  Surgeon: Collene Gobble, MD;  Location: [facility] ENDOSCOPY;  Service: Cardiopulmonary;;    REVIEW OF SYSTEMS:   Review of Systems  Constitutional: Positive for fatigue, decreased appetite.  Negative for chills, fever and unexpected weight change.  HENT: Negative for mouth sores, nosebleeds, sore throat and trouble swallowing.   Eyes: Negative for eye problems and icterus.  Respiratory: Positive for shortness of breath.  Negative for cough, hemoptysis,  and wheezing.   Cardiovascular: Positive for right lower rib pain/chest pain.  Negative for leg  swelling.  Gastrointestinal: Positive for right upper quadrant abdominal pain.  Positive for few episodes of nausea and vomiting.  Negative for constipation and diarrhea. Genitourinary: Negative for bladder incontinence, difficulty urinating, dysuria, frequency and hematuria.   Musculoskeletal: Positive for left hip pain.  Negative for  gait problem, neck pain and neck stiffness.  Skin: Negative for itching and rash.  Neurological: Negative for dizziness, extremity weakness, gait problem, headaches, light-headedness and seizures.  Hematological: Negative for adenopathy. Does not bruise/bleed easily.  Psychiatric/Behavioral: Negative for confusion, depression and sleep disturbance. The patient is not nervous/anxious.     PHYSICAL EXAMINATION:  There were no vitals taken for this visit.  ECOG PERFORMANCE STATUS: 1-2  Physical Exam  Constitutional: Oriented to person, place, and time and well-developed, well-nourished, and in no distress.  Uncomfortable appearing male hunched over the side of his wheelchair. HENT:  Head: Normocephalic and atraumatic.  Mouth/Throat: Oropharynx is clear and moist. No oropharyngeal exudate.  Eyes: Conjunctivae are normal. Right eye exhibits no discharge. Left eye exhibits no discharge. No scleral icterus.  Neck: Normal range of motion. Neck supple.  Cardiovascular: Normal rate, regular rhythm, normal heart sounds and intact distal pulses.   Pulmonary/Chest: Effort normal.  The patient is tachypneic.  No respiratory distress. No wheezes. No rales.  Abdominal: Soft.  Tenderness to palpation in the right upper quadrant.  Bowel sounds are normal. Exhibits no distension and no mass.  Musculoskeletal: Tenderness along the right lower rib cage without any overlying skin changes.  Normal range of motion. Exhibits no edema.  Lymphadenopathy:    No cervical adenopathy.  Neurological: Alert and oriented to person, place, and time. Exhibits normal muscle tone.  Examined in  the wheelchair.  Skin: Skin is warm and dry. No rash noted. Not diaphoretic. No erythema. No pallor.  Psychiatric: Mood, memory and judgment normal.  Vitals reviewed.  LABORATORY DATA: Lab Results  Component Value Date   WBC 10.1 06/03/2021   HGB 11.1 (L) 06/03/2021   HCT 34.3 (L) 06/03/2021   MCV 89.1 06/03/2021   PLT 179 06/03/2021      Chemistry      Component Value Date/Time   NA 141 06/03/2021 0806   K 3.4 (L) 06/03/2021 0806   CL 102 06/03/2021 0806   CO2 30 06/03/2021 0806  BUN 24 (H) 06/03/2021 0806   CREATININE 0.81 06/03/2021 0806      Component Value Date/Time   CALCIUM 10.0 06/03/2021 0806   ALKPHOS 173 (H) 06/03/2021 0806   AST 28 06/03/2021 0806   ALT 15 06/03/2021 0806   BILITOT 0.5 06/03/2021 0806       RADIOGRAPHIC STUDIES:  DG Chest 2 View  Result Date: 05/12/2021 CLINICAL DATA:  Chest pain, shortness of breath EXAM: CHEST - 2 VIEW COMPARISON:  Previous studies including the chest radiograph done on 01/24/2021 and CT chest done on 04/29/2021 FINDINGS: Cardiac size is within normal limits. There is 5.7 cm homogeneous opacity in the left upper lung fields with small central fiduciary marker which has not changed significantly since 04/29/2021. There are ill-defined faint nodular densities in both lungs suggesting possible pulmonary metastatic disease. No definite new focal infiltrates are seen. There is no pleural effusion or pneumothorax. IMPRESSION: There is 5.7 cm radiopacity in the lateral left upper lung fields suggesting possible malignant neoplastic process. Part of this finding may suggest pneumonia. There are multiple small faint nodular densities in both lungs suggesting possible pulmonary metastatic disease. There are no signs of new focal infiltrates or pulmonary edema. Electronically Signed   By: Elmer Picker M.D.   On: 05/12/2021 17:42   CT Angio Chest PE W and/or Wo Contrast  Result Date: 05/12/2021 CLINICAL DATA:  Chest pain EXAM: CT  ANGIOGRAPHY CHEST CT ABDOMEN AND PELVIS WITH CONTRAST TECHNIQUE: Multidetector CT imaging of the chest was performed using the standard protocol during bolus administration of intravenous contrast. Multiplanar CT image reconstructions and MIPs were obtained to evaluate the vascular anatomy. Multidetector CT imaging of the abdomen and pelvis was performed using the standard protocol during bolus administration of intravenous contrast. RADIATION DOSE REDUCTION: This exam was performed according to the departmental dose-optimization program which includes automated exposure control, adjustment of the mA and/or kV according to patient size and/or use of iterative reconstruction technique. CONTRAST:  143mL OMNIPAQUE IOHEXOL 350 MG/ML SOLN COMPARISON:  Chest x-ray 05/12/2021, CT 04/29/2021, 04/27/2021, 03/18/2021, 12/17/2020 FINDINGS: CTA CHEST FINDINGS Cardiovascular: Satisfactory opacification of the pulmonary arteries to the segmental level. No evidence of pulmonary embolism. No dissection is seen. Normal cardiac size. No pericardial effusion. Mediastinum/Nodes: Midline trachea. No thyroid mass. Enlarged prevascular node measuring 10 mm as before. Left paratracheal node measures 13 mm. Esophagus within normal limits. Lungs/Pleura: No pleural effusion or pneumothorax. Innumerable pulmonary nodules consistent with metastatic disease. Irregular left upper lobe lung mass measures approximately 6.1 by 4.5 cm, previously 6 x 4.5 cm with central marker. Musculoskeletal: Sternum is intact. No fracture. Skeletal metastatic disease with multiple lucent and mildly sclerotic lesions involving the bilateral ribs and spine as well as the sternum. Review of the MIP images confirms the above findings. CT ABDOMEN and PELVIS FINDINGS Hepatobiliary: Subtle hypodense liver lesions suspicious for metastatic disease. Gallstones. No biliary dilatation Pancreas: Unremarkable. No pancreatic ductal dilatation or surrounding inflammatory  changes. Spleen: Normal in size without focal abnormality. Adrenals/Urinary Tract: Adrenal glands are normal. Punctate stone left kidney. No hydronephrosis. The bladder is unremarkable Stomach/Bowel: The stomach is nonenlarged. No dilated small bowel. Redemonstrated focal wall thickening at the sigmoid colon with mild inflammation. Percutaneous drainage catheter has been retracted with the pigtail visualized in the subcutaneous fat of the left abdominal wall. Small rim enhancing fluid collection adjacent to the sigmoid colon measuring 2.6 by 1.7 cm. Vascular/Lymphatic: Moderate aortic atherosclerosis. No aneurysm. No suspicious lymph nodes. Reproductive: Prostate is unremarkable. Other: Negative  for pelvic effusion or free air Musculoskeletal: Multiple lytic and sclerotic lesions within the pelvic bones and spine consistent with metastatic disease. Review of the MIP images confirms the above findings. IMPRESSION: 1. Negative for acute pulmonary embolus. 2. Grossly stable size large left upper lobe lung mass since CT from April. Innumerable pulmonary nodules consistent with metastatic disease, probably stable as compared with April 29, 2021, definitely progressed from CT chest previous to this. Similar mild mediastinal nodes. 3. Left lower quadrant drainage catheter has been retracted and the pigtail is now within the subcutaneous fat of the left abdominal wall. Small residual thick rimmed fluid collection adjacent to sigmoid colon measuring 2.6 cm. Mild residual soft tissue inflammatory process at the sigmoid colon with wall thickening which may be inflammatory or neoplastic. 4. Subtle hypodense liver lesions suspect for metastatic disease 5. Multiple lucent and sclerotic lesions within the ribs, sternum, spine and pelvis consistent with metastatic disease 6. Gallstones 7. Nonobstructing left kidney stone Electronically Signed   By: Donavan Foil M.D.   On: 05/12/2021 21:47   MR BRAIN W WO CONTRAST  Result Date:  05/09/2021 CLINICAL DATA:  Brain/CNS neoplasm, assess treatment response. SRS protocol EXAM: MRI HEAD WITHOUT AND WITH CONTRAST TECHNIQUE: Multiplanar, multiecho pulse sequences of the brain and surrounding structures were obtained without and with intravenous contrast. CONTRAST:  38mL MULTIHANCE GADOBENATE DIMEGLUMINE 529 MG/ML IV SOLN COMPARISON:  01/21/2021 FINDINGS: Brain: No acute infarct, mass effect or extra-axial collection. 2-3 scattered foci of chronic microhemorrhage. There is multifocal hyperintense T2-weighted signal within the white matter. Parenchymal volume and CSF spaces are normal. There are numerous scattered punctate contrast enhancing parenchymal lesions. Most of these are unchanged, but there are new lesions: 1. Right thalamus (series 12, image 86) 2. Left thalamus (image 86) 3. Superior cerebellar vermis (image 60) Vascular: Major flow voids are preserved. Skull and upper cervical spine: Normal calvarium and skull base. Visualized upper cervical spine and soft tissues are normal. Sinuses/Orbits:Bilateral mastoid effusions. Diffuse moderate paranasal sinus mucosal thickening. Sinus normal orbits. IMPRESSION: 1. Three new, small metastatic lesions located within the thalami and left insula 2. Otherwise unchanged appearance of numerous scattered small metastases. Electronically Signed   By: Ulyses Jarred M.D.   On: 05/09/2021 02:09   CT ABDOMEN PELVIS W CONTRAST  Result Date: 05/12/2021 CLINICAL DATA:  Chest pain EXAM: CT ANGIOGRAPHY CHEST CT ABDOMEN AND PELVIS WITH CONTRAST TECHNIQUE: Multidetector CT imaging of the chest was performed using the standard protocol during bolus administration of intravenous contrast. Multiplanar CT image reconstructions and MIPs were obtained to evaluate the vascular anatomy. Multidetector CT imaging of the abdomen and pelvis was performed using the standard protocol during bolus administration of intravenous contrast. RADIATION DOSE REDUCTION: This exam was  performed according to the departmental dose-optimization program which includes automated exposure control, adjustment of the mA and/or kV according to patient size and/or use of iterative reconstruction technique. CONTRAST:  135mL OMNIPAQUE IOHEXOL 350 MG/ML SOLN COMPARISON:  Chest x-ray 05/12/2021, CT 04/29/2021, 04/27/2021, 03/18/2021, 12/17/2020 FINDINGS: CTA CHEST FINDINGS Cardiovascular: Satisfactory opacification of the pulmonary arteries to the segmental level. No evidence of pulmonary embolism. No dissection is seen. Normal cardiac size. No pericardial effusion. Mediastinum/Nodes: Midline trachea. No thyroid mass. Enlarged prevascular node measuring 10 mm as before. Left paratracheal node measures 13 mm. Esophagus within normal limits. Lungs/Pleura: No pleural effusion or pneumothorax. Innumerable pulmonary nodules consistent with metastatic disease. Irregular left upper lobe lung mass measures approximately 6.1 by 4.5 cm, previously 6 x 4.5 cm  with central marker. Musculoskeletal: Sternum is intact. No fracture. Skeletal metastatic disease with multiple lucent and mildly sclerotic lesions involving the bilateral ribs and spine as well as the sternum. Review of the MIP images confirms the above findings. CT ABDOMEN and PELVIS FINDINGS Hepatobiliary: Subtle hypodense liver lesions suspicious for metastatic disease. Gallstones. No biliary dilatation Pancreas: Unremarkable. No pancreatic ductal dilatation or surrounding inflammatory changes. Spleen: Normal in size without focal abnormality. Adrenals/Urinary Tract: Adrenal glands are normal. Punctate stone left kidney. No hydronephrosis. The bladder is unremarkable Stomach/Bowel: The stomach is nonenlarged. No dilated small bowel. Redemonstrated focal wall thickening at the sigmoid colon with mild inflammation. Percutaneous drainage catheter has been retracted with the pigtail visualized in the subcutaneous fat of the left abdominal wall. Small rim enhancing  fluid collection adjacent to the sigmoid colon measuring 2.6 by 1.7 cm. Vascular/Lymphatic: Moderate aortic atherosclerosis. No aneurysm. No suspicious lymph nodes. Reproductive: Prostate is unremarkable. Other: Negative for pelvic effusion or free air Musculoskeletal: Multiple lytic and sclerotic lesions within the pelvic bones and spine consistent with metastatic disease. Review of the MIP images confirms the above findings. IMPRESSION: 1. Negative for acute pulmonary embolus. 2. Grossly stable size large left upper lobe lung mass since CT from April. Innumerable pulmonary nodules consistent with metastatic disease, probably stable as compared with April 29, 2021, definitely progressed from CT chest previous to this. Similar mild mediastinal nodes. 3. Left lower quadrant drainage catheter has been retracted and the pigtail is now within the subcutaneous fat of the left abdominal wall. Small residual thick rimmed fluid collection adjacent to sigmoid colon measuring 2.6 cm. Mild residual soft tissue inflammatory process at the sigmoid colon with wall thickening which may be inflammatory or neoplastic. 4. Subtle hypodense liver lesions suspect for metastatic disease 5. Multiple lucent and sclerotic lesions within the ribs, sternum, spine and pelvis consistent with metastatic disease 6. Gallstones 7. Nonobstructing left kidney stone Electronically Signed   By: Donavan Foil M.D.   On: 05/12/2021 21:47     ASSESSMENT/PLAN:  This is a very pleasant 61 year old African-American male with stage IV (T2b, N2, M1 C) non-small cell lung cancer, adenocarcinoma.  He presented with a left upper lobe lung mass in addition to left hilar and mediastinal lymphadenopathy.  He had also had a small left iliac osseous lesion and metastatic disease to the brain.  He was diagnosed in September 2022.  PD-L1 expression 10%.  [facility] does not have enough circulating tumor and his tissue was negative for any actionable mutations.     He completed SRS to the metastatic brain lesion under the care of Dr. Lisbeth Renshaw in October 2022.    The patient had disease progression in the brain in January 2023 and underwent whole brain radiation under the care of Dr. Lisbeth Renshaw which was completed on 02/10/2021.    The patient underwent palliative systemic chemotherapy with carboplatin for an AUC of 5, Alimta 500 mg per metered squared, Keytruda 200 mg IV every 3 weeks.  The patient status post 5 cycles tolerated it fairly well.  Starting from cycle #5, the patient began maintenance Alimta and Keytruda.This was discontinued in February 2023 due to disease progression.    Due to the disease progression, the then was on treatment with docetaxel 75 mg per metered squared and Cyramza 10 mg/kg IV every 3 weeks with Neulasta support.  The patient status post 3 cycles.  He had a challenging time with the Neulasta injection.  Cyramza was discontinued after cycle #1 due  to possible GI perforation.  Unfortunately, the patient evidence of disease progression with increased size and number of pulmonary nodules as well as new skeletal metastatic disease.  The patient is currently undergoing palliative radiation to the painful pelvic lesions under the care of Dr. Lisbeth Renshaw.  His last day radiation is tentatively scheduled for 06/27/2021.  For systemic treatment, Dr. Julien Nordmann discussed his options include hospice or further chemotherapy.  The patient opted to proceed with further chemotherapy and is currently undergoing gemcitabine 1000 mg on days 1 and 8 IV every 3 weeks.  He is status post day 1 cycle 1.  Earlier this week, the patient had a fall at home.  He has been endorsing significant right upper quadrant pain and right rib pain.  He is also endorsing pain with inspiration.  Chest x-ray from earlier this week was negative for any fractures.  The patient was seen with Dr. Julien Nordmann today.  Labs were reviewed.  Due to the patient's uncontrolled pain, tachypnea, and right  upper quadrant and right lower rib pain and chest pain recommend that he be evaluated in the ER to rule out life-threatening etiologies of his uncontrolled pain and chest discomfort.   If not there is no emergent findings such as PE on his work-up in the emergency room and if he is not admitted, I will reschedule his infusion for Thursday or Friday this week.  We will see him back for follow-up visit in 2 weeks for evaluation and repeat blood work before starting day 1 cycle 2.  He will keep his appointment with GI due to his recurrent bouts of diverticulitis.  He will continue to follow with palliative care, social work, psychiatry, and the spiritual support team.  The patient was advised to call immediately if he has any concerning symptoms in the interval. The patient voices understanding of current disease status and treatment options and is in agreement with the current care plan. All questions were answered. The patient knows to call the clinic with any problems, questions or concerns. We can certainly see the patient much sooner if necessary  No orders of the defined types were placed in this encounter.   Deaisa Merida L Melburn Treiber, PA-C 06/04/21  ADDENDUM: Hematology/Oncology Attending: I had a face-to-face encounter with the patient today.  I reviewed his record, lab and recommended his care plan.  This is a very pleasant 61 years old African-American male with stage IV non-small cell lung cancer that was initially diagnosed in September 2022 with PD-L1 expression of 10% and no actionable mutations. The patient had brain metastasis and she underwent SRS to multiple brain metastasis.  He also started systemic chemotherapy initially with carboplatin, Alimta and Keytruda but this was discontinued secondary to disease progression. The patient started second line systemic chemotherapy with docetaxel and Cyramza again discontinued after 3 cycles secondary to disease progression. The patient  was not interested in palliative care and hospice and he decided to proceed with third line treatment with single agent gemcitabine is status post day 1 of 1 cycle1. He was supposed to start day 8 of cycle #1 today but unfortunately the patient came today complaining of increasing weakness and fatigue as well as pain and the right upper quadrant that is intolerable.  He has a history of recurrent bouts of diverticulitis and he had perforation in the past. I recommended for the patient to delay the start of his treatment for few days until he has further evaluation of his current abdominal pain.  We  will send him immediately to the emergency department for additional work-up and to rule out any underlying acute condition. If no concerning findings on his evaluation, he would receive his treatment in the next few days and come back for follow-up visit in 2 weeks before starting cycle #2. The patient was advised to call if he has any other concerning issues in the interval.  The total time spent in the appointment was 30 minutes. Disclaimer: This note was dictated with voice recognition software. Similar sounding words can inadvertently be transcribed and may be missed upon review. Eilleen Kempf, MD

## 2021-06-04 NOTE — Telephone Encounter (Signed)
-----   Message from Daphane Shepherd, RN sent at 06/03/2021 10:34 AM EDT ----- Regarding: First time Gemzar First time gemcitabine, has had chemo in the past. Medicine did burn his arm during his infusion but we were able to get it under control and complete treatment. Did well otherwise.

## 2021-06-07 ENCOUNTER — Other Ambulatory Visit: Payer: Self-pay

## 2021-06-07 ENCOUNTER — Other Ambulatory Visit: Payer: 59

## 2021-06-07 ENCOUNTER — Inpatient Hospital Stay: Payer: Medicaid Other

## 2021-06-07 ENCOUNTER — Other Ambulatory Visit: Payer: Self-pay | Admitting: Radiation Oncology

## 2021-06-07 ENCOUNTER — Other Ambulatory Visit (HOSPITAL_COMMUNITY): Payer: Self-pay

## 2021-06-07 ENCOUNTER — Telehealth: Payer: Self-pay | Admitting: Radiation Oncology

## 2021-06-07 ENCOUNTER — Ambulatory Visit (HOSPITAL_COMMUNITY)
Admission: RE | Admit: 2021-06-07 | Discharge: 2021-06-07 | Disposition: A | Discharge status: Home / Self Care | Payer: Medicaid Other | Source: Ambulatory Visit | Attending: Radiation Oncology | Admitting: Radiation Oncology

## 2021-06-07 ENCOUNTER — Ambulatory Visit
Admission: RE | Admit: 2021-06-07 | Discharge: 2021-06-07 | Disposition: A | Discharge status: Home / Self Care | Payer: Medicaid Other | Source: Ambulatory Visit | Attending: Internal Medicine | Admitting: Internal Medicine

## 2021-06-07 DIAGNOSIS — C3492 Malignant neoplasm of unspecified part of left bronchus or lung: Secondary | ICD-10-CM | POA: Insufficient documentation

## 2021-06-07 DIAGNOSIS — W101XXA Fall (on)(from) sidewalk curb, initial encounter: Secondary | ICD-10-CM

## 2021-06-07 DIAGNOSIS — C7931 Secondary malignant neoplasm of brain: Secondary | ICD-10-CM | POA: Insufficient documentation

## 2021-06-07 DIAGNOSIS — Y929 Unspecified place or not applicable: Secondary | ICD-10-CM | POA: Insufficient documentation

## 2021-06-07 DIAGNOSIS — Z510 Encounter for antineoplastic radiation therapy: Secondary | ICD-10-CM | POA: Diagnosis not present

## 2021-06-07 DIAGNOSIS — Y939 Activity, unspecified: Secondary | ICD-10-CM | POA: Insufficient documentation

## 2021-06-07 LAB — RAD ONC ARIA SESSION SUMMARY
Course Elapsed Days: 0
Plan Fractions Treated to Date: 1
Plan Fractions Treated to Date: 1
Plan Prescribed Dose Per Fraction: 2.5 Gy
Plan Prescribed Dose Per Fraction: 2.5 Gy
Plan Total Fractions Prescribed: 15
Plan Total Fractions Prescribed: 15
Plan Total Prescribed Dose: 37.5 Gy
Plan Total Prescribed Dose: 37.5 Gy
Reference Point Dosage Given to Date: 2.5 Gy
Reference Point Dosage Given to Date: 2.5 Gy
Reference Point Session Dosage Given: 2.5 Gy
Reference Point Session Dosage Given: 2.5 Gy
Session Number: 1

## 2021-06-07 NOTE — Telephone Encounter (Signed)
I called and spoke with the patient's wife to clarify his medication. She notes he has been very confused at times, sleepy, and sometimes difficult to gauge. He has been taking pain medication but when we talked about him telling our staff he only had two pills left, she was surprised as he would have taken on average more than 20 pills in a day. She is going to see what he has at home, try to look in a bag he takes with him to clarify what is or isn't there, and let us know. We discussed that he fell, but further clarification came after my last note that he fell actually at home and hurt his chest wall on the banister of his stair railing. I'll message palliative care as well, but we're planning on rib films to rule out rib fracture.

## 2021-06-07 NOTE — Progress Notes (Signed)
Pt fell in the parking lot on the way into his radiation. He complained to nursing about pain in his right chest wall. No other injuries were reported, and no loss of consiousness occurred. We tried to reach his wife to confirm his pain medication because he requested a lesser strength narcotic than the short and long acting medications he has. He should have enough of his medication given the recency of being refilled. I will reach out to palliative care as well. As far as his chest wall pain, we will order plain rib films to rule out traumatic fracture.

## 2021-06-08 ENCOUNTER — Inpatient Hospital Stay: Payer: Medicaid Other

## 2021-06-08 ENCOUNTER — Telehealth: Payer: Self-pay | Admitting: Radiation Oncology

## 2021-06-08 ENCOUNTER — Ambulatory Visit
Admission: RE | Admit: 2021-06-08 | Discharge: 2021-06-08 | Disposition: A | Discharge status: Home / Self Care | Payer: Medicaid Other | Source: Ambulatory Visit | Attending: Radiation Oncology | Admitting: Radiation Oncology

## 2021-06-08 ENCOUNTER — Other Ambulatory Visit: Payer: Self-pay

## 2021-06-08 DIAGNOSIS — Z510 Encounter for antineoplastic radiation therapy: Secondary | ICD-10-CM | POA: Diagnosis not present

## 2021-06-08 LAB — RAD ONC ARIA SESSION SUMMARY
Course Elapsed Days: 1
Plan Fractions Treated to Date: 2
Plan Fractions Treated to Date: 2
Plan Prescribed Dose Per Fraction: 2.5 Gy
Plan Prescribed Dose Per Fraction: 2.5 Gy
Plan Total Fractions Prescribed: 15
Plan Total Fractions Prescribed: 15
Plan Total Prescribed Dose: 37.5 Gy
Plan Total Prescribed Dose: 37.5 Gy
Reference Point Dosage Given to Date: 5 Gy
Reference Point Dosage Given to Date: 5 Gy
Reference Point Session Dosage Given: 2.5 Gy
Reference Point Session Dosage Given: 2.5 Gy
Session Number: 2

## 2021-06-08 NOTE — Telephone Encounter (Signed)
I called the patient's wife to let her know he does not have any fractured ribs. She disclosed she has not been able to figure out how many pills he has. 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 He has been sober but she is concerned his prior addictions are keeping him from being honest about his condition. She was not aware that he had cancer in his bones or that his cancer had progressed while he was off chemotherapy. She would like to be a part of the meetings he has when he has a change in his plan for care.

## 2021-06-09 ENCOUNTER — Inpatient Hospital Stay: Payer: Medicaid Other

## 2021-06-09 ENCOUNTER — Encounter (HOSPITAL_COMMUNITY): Payer: Self-pay

## 2021-06-09 ENCOUNTER — Emergency Department (HOSPITAL_COMMUNITY): Payer: Medicaid Other

## 2021-06-09 ENCOUNTER — Inpatient Hospital Stay: Payer: Medicaid Other | Admitting: Nurse Practitioner

## 2021-06-09 ENCOUNTER — Emergency Department (HOSPITAL_COMMUNITY)
Admission: EM | Admit: 2021-06-09 | Discharge: 2021-06-09 | Disposition: A | Discharge status: Home / Self Care | Payer: Medicaid Other | Attending: Emergency Medicine | Admitting: Emergency Medicine

## 2021-06-09 ENCOUNTER — Ambulatory Visit: Admission: RE | Admit: 2021-06-09 | Payer: Medicaid Other | Source: Ambulatory Visit

## 2021-06-09 ENCOUNTER — Other Ambulatory Visit: Payer: Self-pay

## 2021-06-09 ENCOUNTER — Inpatient Hospital Stay (HOSPITAL_BASED_OUTPATIENT_CLINIC_OR_DEPARTMENT_OTHER): Payer: Medicaid Other | Admitting: Physician Assistant

## 2021-06-09 VITALS — BP 142/86 | HR 92 | Temp 98.0°F | Resp 16 | Wt 224.7 lb

## 2021-06-09 DIAGNOSIS — Z85118 Personal history of other malignant neoplasm of bronchus and lung: Secondary | ICD-10-CM | POA: Insufficient documentation

## 2021-06-09 DIAGNOSIS — R0602 Shortness of breath: Secondary | ICD-10-CM | POA: Insufficient documentation

## 2021-06-09 DIAGNOSIS — Z7189 Other specified counseling: Secondary | ICD-10-CM

## 2021-06-09 DIAGNOSIS — R1011 Right upper quadrant pain: Secondary | ICD-10-CM

## 2021-06-09 DIAGNOSIS — R109 Unspecified abdominal pain: Secondary | ICD-10-CM | POA: Insufficient documentation

## 2021-06-09 DIAGNOSIS — C3492 Malignant neoplasm of unspecified part of left bronchus or lung: Secondary | ICD-10-CM

## 2021-06-09 DIAGNOSIS — Z510 Encounter for antineoplastic radiation therapy: Secondary | ICD-10-CM | POA: Diagnosis not present

## 2021-06-09 LAB — CBC WITH DIFFERENTIAL/PLATELET
Abs Immature Granulocytes: 0.08 10*3/uL — ABNORMAL HIGH (ref 0.00–0.07)
Basophils Absolute: 0 10*3/uL (ref 0.0–0.1)
Basophils Relative: 0 %
Eosinophils Absolute: 0.2 10*3/uL (ref 0.0–0.5)
Eosinophils Relative: 4 %
HCT: 30.4 % — ABNORMAL LOW (ref 39.0–52.0)
Hemoglobin: 9.6 g/dL — ABNORMAL LOW (ref 13.0–17.0)
Immature Granulocytes: 2 %
Lymphocytes Relative: 18 %
Lymphs Abs: 0.9 10*3/uL (ref 0.7–4.0)
MCH: 28.4 pg (ref 26.0–34.0)
MCHC: 31.6 g/dL (ref 30.0–36.0)
MCV: 89.9 fL (ref 80.0–100.0)
Monocytes Absolute: 0.1 10*3/uL (ref 0.1–1.0)
Monocytes Relative: 2 %
Neutro Abs: 3.8 10*3/uL (ref 1.7–7.7)
Neutrophils Relative %: 74 %
Platelets: 109 10*3/uL — ABNORMAL LOW (ref 150–400)
RBC: 3.38 MIL/uL — ABNORMAL LOW (ref 4.22–5.81)
RDW: 14.3 % (ref 11.5–15.5)
WBC: 5.1 10*3/uL (ref 4.0–10.5)
nRBC: 0.6 % — ABNORMAL HIGH (ref 0.0–0.2)

## 2021-06-09 LAB — BASIC METABOLIC PANEL
Anion gap: 10 (ref 5–15)
BUN: 19 mg/dL (ref 6–20)
CO2: 25 mmol/L (ref 22–32)
Calcium: 9.6 mg/dL (ref 8.9–10.3)
Chloride: 107 mmol/L (ref 98–111)
Creatinine, Ser: 0.75 mg/dL (ref 0.61–1.24)
GFR, Estimated: >60 mL/min (ref >60)
Glucose, Bld: 106 mg/dL — ABNORMAL HIGH (ref 70–99)
Potassium: 3.4 mmol/L — ABNORMAL LOW (ref 3.5–5.1)
Sodium: 142 mmol/L (ref 135–145)

## 2021-06-09 LAB — CBC WITH DIFFERENTIAL (CANCER CENTER ONLY)
Abs Immature Granulocytes: 0.06 10*3/uL (ref 0.00–0.07)
Basophils Absolute: 0 10*3/uL (ref 0.0–0.1)
Basophils Relative: 1 %
Eosinophils Absolute: 0.2 10*3/uL (ref 0.0–0.5)
Eosinophils Relative: 5 %
HCT: 29.2 % — ABNORMAL LOW (ref 39.0–52.0)
Hemoglobin: 9.5 g/dL — ABNORMAL LOW (ref 13.0–17.0)
Immature Granulocytes: 1 %
Lymphocytes Relative: 19 %
Lymphs Abs: 0.9 10*3/uL (ref 0.7–4.0)
MCH: 28.4 pg (ref 26.0–34.0)
MCHC: 32.5 g/dL (ref 30.0–36.0)
MCV: 87.4 fL (ref 80.0–100.0)
Monocytes Absolute: 0.1 10*3/uL (ref 0.1–1.0)
Monocytes Relative: 2 %
Neutro Abs: 3.2 10*3/uL (ref 1.7–7.7)
Neutrophils Relative %: 72 %
Platelet Count: 96 10*3/uL — ABNORMAL LOW (ref 150–400)
RBC: 3.34 MIL/uL — ABNORMAL LOW (ref 4.22–5.81)
RDW: 14.2 % (ref 11.5–15.5)
WBC Count: 4.5 10*3/uL (ref 4.0–10.5)
nRBC: 0.4 % — ABNORMAL HIGH (ref 0.0–0.2)

## 2021-06-09 LAB — URINALYSIS, ROUTINE W REFLEX MICROSCOPIC
Bilirubin Urine: NEGATIVE
Glucose, UA: NEGATIVE mg/dL
Hgb urine dipstick: NEGATIVE
Ketones, ur: NEGATIVE mg/dL
Leukocytes,Ua: NEGATIVE
Nitrite: NEGATIVE
Protein, ur: NEGATIVE mg/dL
Specific Gravity, Urine: 1.02 (ref 1.005–1.030)
pH: 6 (ref 5.0–8.0)

## 2021-06-09 LAB — CMP (CANCER CENTER ONLY)
ALT: 15 U/L (ref 0–44)
AST: 32 U/L (ref 15–41)
Albumin: 3.4 g/dL — ABNORMAL LOW (ref 3.5–5.0)
Alkaline Phosphatase: 153 U/L — ABNORMAL HIGH (ref 38–126)
Anion gap: 10 (ref 5–15)
BUN: 18 mg/dL (ref 6–20)
CO2: 25 mmol/L (ref 22–32)
Calcium: 9.6 mg/dL (ref 8.9–10.3)
Chloride: 106 mmol/L (ref 98–111)
Creatinine: 0.67 mg/dL (ref 0.61–1.24)
GFR, Estimated: >60 mL/min (ref >60)
Glucose, Bld: 113 mg/dL — ABNORMAL HIGH (ref 70–99)
Potassium: 3.3 mmol/L — ABNORMAL LOW (ref 3.5–5.1)
Sodium: 141 mmol/L (ref 135–145)
Total Bilirubin: 0.7 mg/dL (ref 0.3–1.2)
Total Protein: 6.4 g/dL — ABNORMAL LOW (ref 6.5–8.1)

## 2021-06-09 MED ORDER — MORPHINE SULFATE (PF) 4 MG/ML IV SOLN
4.0000 mg | Freq: Once | INTRAVENOUS | Status: AC
  Administered 2021-06-09: 4 mg via INTRAVENOUS
  Filled 2021-06-09: qty 1

## 2021-06-09 NOTE — Discharge Instructions (Addendum)
You have been seen and discharged from the emergency department.  The x-ray showed no rib fractures, the CT scan shows chronic findings for you.  I believe you are suffering from musculoskeletal pain/injury.  Take your home pain medicine as needed.  Follow-up with your primary provider for further evaluation and further care. Take home medications as prescribed. If you have any worsening symptoms, worsening abdominal pain, diarrhea, fever or further concerns for your health please return to an emergency department for further evaluation.

## 2021-06-09 NOTE — ED Triage Notes (Signed)
Pt arrived via wheelchair from cancer center. Was there for treatment this morning, was c/o worsening right rib pain and SOB s/p mechanical fall several days ago. States he had xrays done after the fall but never got results.

## 2021-06-09 NOTE — ED Provider Notes (Signed)
[facility] DEPT Provider Note   CSN: [id number] Arrival date & time: 06/09/21  0836     History  Chief Complaint  Patient presents with   Shortness of Breath    Raymond [name] is a 61 y.o. male.  HPI  61 year old male with past medical history of lung cancer currently undergoing treatment who presents to the emergency department with right-sided pain.  Patient reports a mechanical fall a couple days ago down onto his right side.  He states that they did rib x-rays, he believes these were negative for fracture however continues to have right-sided pain.  He states the pain is primarily in the right abdomen/flank but does extend up to the ribs.  He is associated shortness of breath because taking a deep breath irritates that area.  No overlying skin changes or rash.  No recent fever or cough.  Home Medications Prior to Admission medications   Medication Sig Start Date End Date Taking? Authorizing Provider  acetaminophen (TYLENOL) 650 MG CR tablet Take 650 mg by mouth every 8 (eight) hours as needed for pain.    [provider]  Ensure Plus (ENSURE PLUS) LIQD Take 237 mLs by mouth 2 (two) times daily between meals. Chocolate    [provider]  fluticasone (FLONASE) 50 MCG/ACT nasal spray Place 1 spray into both nostrils daily. 03/16/21   Pickenpack-Cousar, Carlena Sax, NP  folic acid (FOLVITE) 1 MG tablet Take 1 tablet by mouth daily. 05/17/21 08/15/21  Mercy Riding, MD  gabapentin (NEURONTIN) 300 MG capsule Take 2 capsules (600 mg total) by mouth at bedtime. 05/25/21 06/25/21  Pickenpack-Cousar, Carlena Sax, NP  memantine (NAMENDA) 10 MG tablet Take 1 tablet (10 mg total) by mouth 2 (two) times daily. 01/26/21   [name], PA-C  mirtazapine (REMERON) 30 MG tablet Take 1 tablet (30 mg total) by mouth at bedtime. 04/13/21   [name], [name], PA-C  morphine (MS CONTIN) 15 MG 12 hr tablet Take 1 tablet by mouth every 12  hours.  05/25/21   Pickenpack-Cousar, Carlena Sax, NP  Multiple Vitamin (MULTIVITAMIN WITH MINERALS) TABS tablet Take 1 tablet by mouth daily. Patient taking differently: Take 1 tablet by mouth 2 (two) times daily. Ultimate Man 03/10/21   Mercy Riding, MD  ondansetron (ZOFRAN) 8 MG tablet Take 1 tablet (8 mg total) by mouth every 8 (eight) hours as needed for nausea or vomiting. 04/13/21   Pickenpack-Cousar, Carlena Sax, NP  Oxycodone HCl 10 MG TABS Take 1/2 to 1 tablet (5-10 mg total) by mouth every 4 (four) hours as needed for severe pain. 06/02/21   [name], PA-C  prochlorperazine (COMPAZINE) 10 MG tablet Take 1 tablet (10 mg total) by mouth every 6 (six) hours as needed for nausea or vomiting. 02/25/21   Pickenpack-Cousar, Carlena Sax, NP  senna-docusate (SENNA S) 8.6-50 MG tablet Take 2 tablets by mouth daily. 04/13/21   Pickenpack-Cousar, Carlena Sax, NP  vitamin B-12 (CYANOCOBALAMIN) 1000 MCG tablet Take 1,000 mcg by mouth daily.    [provider]  Wheat Dextrin (BENEFIBER DRINK MIX) PACK Take 1 Package by mouth daily at 6 (six) AM.    [provider]      Allergies    Benadryl [diphenhydramine]    Review of Systems   Review of Systems  Constitutional:  Negative for fever.  Respiratory:  Positive for shortness of breath.   Cardiovascular:  Negative for chest pain.  Gastrointestinal:  Positive for abdominal pain.  Negative for diarrhea and vomiting.  Musculoskeletal:  Negative for back pain and neck pain.       + right rib pain  Skin:  Negative for rash.  Neurological:  Negative for headaches.   Physical Exam Updated Vital Signs BP (!) 169/99   Pulse 90   Temp (!) 97.4 F (36.3 C) (Oral)   Resp 18   SpO2 94%  Physical Exam Vitals and nursing note reviewed.  Constitutional:      General: He is not in acute distress.    Appearance: Normal appearance.  HENT:     Head: Normocephalic.     Mouth/Throat:     Mouth: Mucous membranes are moist.  Cardiovascular:     Rate  and Rhythm: Normal rate.  Pulmonary:     Effort: Pulmonary effort is normal. No respiratory distress.     Breath sounds: Examination of the right-lower field reveals decreased breath sounds. Examination of the left-lower field reveals decreased breath sounds. Decreased breath sounds present.  Chest:     Chest wall: No crepitus.     Comments: No rib TTP Abdominal:     Palpations: Abdomen is soft.     Tenderness: There is no abdominal tenderness.     Comments: Right upper, lateral and cva TTP  Skin:    General: Skin is warm.  Neurological:     Mental Status: He is alert and oriented to person, place, and time. Mental status is at baseline.  Psychiatric:        Mood and Affect: Mood normal.    ED Results / Procedures / Treatments   Labs (all labs ordered are listed, but only abnormal results are displayed) Labs Reviewed  BASIC METABOLIC PANEL - Abnormal; Notable for the following components:      Result Value   Potassium 3.4 (*)    Glucose, Bld 106 (*)    All other components within normal limits  URINALYSIS, ROUTINE W REFLEX MICROSCOPIC  CBC WITH DIFFERENTIAL/PLATELET    EKG EKG Interpretation  Date/Time:  Wednesday June 09 2021 08:45:19 EDT Ventricular Rate:  79 PR Interval:  168 QRS Duration: 87 QT Interval:  363 QTC Calculation: 417 R Axis:   -2 Text Interpretation: Sinus or ectopic atrial rhythm Borderline T abnormalities, inferior leads Similar to previous Confirmed by Lavenia Atlas [phone]) on 06/09/2021 9:31:42 AM  Radiology DG Chest 2 View  Result Date: 06/09/2021 CLINICAL DATA:  Shortness of breath, worsening right rib pain, fall several days ago, initial encounter. EXAM: CHEST - 2 VIEW COMPARISON:  06/07/2021 and CT chest 05/12/2021. FINDINGS: Trachea is midline. Heart size normal. Diffuse nodularity throughout the lungs with masslike consolidation in the left upper lobe, at the site of a fiducial marker. Findings appear fairly similar to 06/07/2021. No pleural  fluid. Osseous structures appear grossly intact. IMPRESSION: Left upper lobe mass with bilateral pulmonary metastases, as on recent exams. No definite superimposed acute finding. Electronically Signed   By: Lorin Picket M.D.   On: 06/09/2021 09:28   DG Ribs Bilateral  Result Date: 06/08/2021 CLINICAL DATA:  Status post fall with bilateral rib pain. Known mass in the left lung with bilateral pulmonary metastasis. EXAM: BILATERAL RIBS - 3+ VIEW COMPARISON:  May 12, 2021 chest x-ray and chest CT FINDINGS: No acute fracture or dislocation is identified in bilateral ribs. There is generalized lytic lucency in the left third rib correlating to the bony metastatic change seen on CT. IMPRESSION: No acute fracture or dislocation. Generalized lytic lucency in the  left third rib correlating to the bony metastatic change seen on CT. Electronically Signed   By: Abelardo Diesel M.D.   On: 06/08/2021 16:26    Procedures Procedures    Medications Ordered in ED Medications  morphine (PF) 4 MG/ML injection 4 mg (4 mg Intravenous Given 06/09/21 1004)    ED Course/ Medical Decision Making/ A&P                           Medical Decision Making Amount and/or Complexity of Data Reviewed Labs: ordered. Radiology: ordered.  Risk Prescription drug management.   61 year old male presents emergency department after mechanical fall, with ongoing right-sided pain.  X-rays taken after the fall reviewed, no findings of rib fracture or lung abnormality.  Repeat chest x-ray here shows no acute finding.  Patient's main discomfort for me is in the right side of the abdomen/flank.  Blood work is baseline, baseline anemia.  Urinalysis shows no hematuria.  CT of the abdomen pelvis shows no traumatic findings.  There is baseline sigmoid inflammation, metastatic bony findings but this is all unchanged for the patient.  There is mention of a possibly dilated appendix but he has no other acute symptoms of appendicitis.  No fever or  diarrhea.  Patient educated on symptoms of appendicitis.  He otherwise feels baseline and will plan for outpatient symptomatic management.  Patient at this time appears safe and stable for discharge and close outpatient follow up. Discharge plan and strict return to ED precautions discussed, patient verbalizes understanding and agreement.        Final Clinical Impression(s) / ED Diagnoses Final diagnoses:  None    Rx / DC Orders ED Discharge Orders     None         Lorelle Gibbs, DO 06/09/21 1510

## 2021-06-10 ENCOUNTER — Other Ambulatory Visit: Payer: Self-pay

## 2021-06-10 ENCOUNTER — Telehealth: Payer: Self-pay | Admitting: Physician Assistant

## 2021-06-10 ENCOUNTER — Inpatient Hospital Stay: Payer: Medicaid Other

## 2021-06-10 ENCOUNTER — Ambulatory Visit
Admission: RE | Admit: 2021-06-10 | Discharge: 2021-06-10 | Disposition: A | Discharge status: Home / Self Care | Payer: Medicaid Other | Source: Ambulatory Visit | Attending: Radiation Oncology | Admitting: Radiation Oncology

## 2021-06-10 ENCOUNTER — Other Ambulatory Visit (HOSPITAL_COMMUNITY): Payer: Self-pay

## 2021-06-10 DIAGNOSIS — Z510 Encounter for antineoplastic radiation therapy: Secondary | ICD-10-CM | POA: Diagnosis not present

## 2021-06-10 LAB — RAD ONC ARIA SESSION SUMMARY
Course Elapsed Days: 3
Plan Fractions Treated to Date: 3
Plan Fractions Treated to Date: 3
Plan Prescribed Dose Per Fraction: 2.5 Gy
Plan Prescribed Dose Per Fraction: 2.5 Gy
Plan Total Fractions Prescribed: 15
Plan Total Fractions Prescribed: 15
Plan Total Prescribed Dose: 37.5 Gy
Plan Total Prescribed Dose: 37.5 Gy
Reference Point Dosage Given to Date: 7.5 Gy
Reference Point Dosage Given to Date: 7.5 Gy
Reference Point Session Dosage Given: 2.5 Gy
Reference Point Session Dosage Given: 2.5 Gy
Session Number: 3

## 2021-06-10 NOTE — Telephone Encounter (Signed)
No acute findings were noted in ER workup. His infusion for this week has been rescheduled for Friday morning. Labs from 6/7 acceptable to use for infusion on 6/9. Reviewed with Dr. Julien Nordmann and ok to treat with platelet count of 96k.

## 2021-06-11 ENCOUNTER — Other Ambulatory Visit: Payer: Self-pay

## 2021-06-11 ENCOUNTER — Ambulatory Visit
Admission: RE | Admit: 2021-06-11 | Discharge: 2021-06-11 | Disposition: A | Discharge status: Home / Self Care | Payer: Medicaid Other | Source: Ambulatory Visit | Attending: Radiation Oncology | Admitting: Radiation Oncology

## 2021-06-11 ENCOUNTER — Encounter: Payer: Self-pay | Admitting: Licensed Clinical Social Worker

## 2021-06-11 ENCOUNTER — Inpatient Hospital Stay (HOSPITAL_BASED_OUTPATIENT_CLINIC_OR_DEPARTMENT_OTHER): Payer: Medicaid Other | Admitting: Nurse Practitioner

## 2021-06-11 ENCOUNTER — Other Ambulatory Visit (HOSPITAL_COMMUNITY): Payer: Self-pay

## 2021-06-11 ENCOUNTER — Inpatient Hospital Stay: Payer: Medicaid Other

## 2021-06-11 ENCOUNTER — Encounter: Payer: Self-pay | Admitting: Internal Medicine

## 2021-06-11 ENCOUNTER — Telehealth: Payer: Self-pay

## 2021-06-11 VITALS — BP 152/78 | HR 87 | Temp 98.0°F | Resp 18

## 2021-06-11 DIAGNOSIS — C3492 Malignant neoplasm of unspecified part of left bronchus or lung: Secondary | ICD-10-CM | POA: Diagnosis not present

## 2021-06-11 DIAGNOSIS — G893 Neoplasm related pain (acute) (chronic): Secondary | ICD-10-CM

## 2021-06-11 DIAGNOSIS — F419 Anxiety disorder, unspecified: Secondary | ICD-10-CM

## 2021-06-11 DIAGNOSIS — Z7189 Other specified counseling: Secondary | ICD-10-CM

## 2021-06-11 DIAGNOSIS — G4700 Insomnia, unspecified: Secondary | ICD-10-CM

## 2021-06-11 DIAGNOSIS — C7931 Secondary malignant neoplasm of brain: Secondary | ICD-10-CM | POA: Diagnosis not present

## 2021-06-11 DIAGNOSIS — R531 Weakness: Secondary | ICD-10-CM

## 2021-06-11 DIAGNOSIS — Z515 Encounter for palliative care: Secondary | ICD-10-CM | POA: Diagnosis not present

## 2021-06-11 DIAGNOSIS — F32A Depression, unspecified: Secondary | ICD-10-CM

## 2021-06-11 DIAGNOSIS — C3412 Malignant neoplasm of upper lobe, left bronchus or lung: Secondary | ICD-10-CM

## 2021-06-11 DIAGNOSIS — Z510 Encounter for antineoplastic radiation therapy: Secondary | ICD-10-CM | POA: Diagnosis not present

## 2021-06-11 DIAGNOSIS — K5903 Drug induced constipation: Secondary | ICD-10-CM

## 2021-06-11 DIAGNOSIS — R634 Abnormal weight loss: Secondary | ICD-10-CM

## 2021-06-11 DIAGNOSIS — R630 Anorexia: Secondary | ICD-10-CM

## 2021-06-11 LAB — RAD ONC ARIA SESSION SUMMARY
Course Elapsed Days: 4
Plan Fractions Treated to Date: 4
Plan Fractions Treated to Date: 4
Plan Prescribed Dose Per Fraction: 2.5 Gy
Plan Prescribed Dose Per Fraction: 2.5 Gy
Plan Total Fractions Prescribed: 15
Plan Total Fractions Prescribed: 15
Plan Total Prescribed Dose: 37.5 Gy
Plan Total Prescribed Dose: 37.5 Gy
Reference Point Dosage Given to Date: 10 Gy
Reference Point Dosage Given to Date: 10 Gy
Reference Point Session Dosage Given: 2.5 Gy
Reference Point Session Dosage Given: 2.5 Gy
Session Number: 4

## 2021-06-11 MED ORDER — SODIUM CHLORIDE 0.9 % IV SOLN: Freq: Once | INTRAVENOUS | Status: AC

## 2021-06-11 MED ORDER — PROCHLORPERAZINE MALEATE 10 MG PO TABS
10.0000 mg | ORAL_TABLET | Freq: Once | ORAL | Status: AC
  Administered 2021-06-11: 10 mg via ORAL
  Filled 2021-06-11: qty 1

## 2021-06-11 MED ORDER — OXYCODONE HCL 10 MG PO TABS
10.0000 mg | ORAL_TABLET | ORAL | 0 refills | Status: DC | PRN
  Filled 2021-06-11 (×2): qty 15, 3d supply, fill #0

## 2021-06-11 MED ORDER — SODIUM CHLORIDE 0.9 % IV SOLN
1000.0000 mg/m2 | Freq: Once | INTRAVENOUS | Status: AC
  Administered 2021-06-11: 2318 mg via INTRAVENOUS
  Filled 2021-06-11: qty 60.96

## 2021-06-11 NOTE — Patient Instructions (Signed)
[facility] MEDICAL [facility]COLOGY  Discharge Instructions: Thank you for choosing [facility] to provide your oncology and hematology care.   If you have a lab appointment with the Cancer Center, please go directly to the Cancer Center and check in at the registration area.   Wear comfortable clothing and clothing appropriate for easy access to any Portacath or PICC line.   We strive to give you quality time with your provider. You may need to reschedule your appointment if you arrive late (15 or more minutes).  Arriving late affects you and other patients whose appointments are after yours.  Also, if you miss three or more appointments without notifying the office, you may be dismissed from the clinic at the provider's discretion.      For prescription refill requests, have your pharmacy contact our office and allow 72 hours for refills to be completed.    Today you received the following chemotherapy and/or immunotherapy agents Gemzar      To help prevent nausea and vomiting after your treatment, we encourage you to take your nausea medication as directed.  BELOW ARE SYMPTOMS THAT SHOULD BE REPORTED IMMEDIATELY: *FEVER GREATER THAN 100.4 F (38 C) OR HIGHER *CHILLS OR SWEATING *NAUSEA AND VOMITING THAT IS NOT CONTROLLED WITH YOUR NAUSEA MEDICATION *UNUSUAL SHORTNESS OF BREATH *UNUSUAL BRUISING OR BLEEDING *URINARY PROBLEMS (pain or burning when urinating, or frequent urination) *BOWEL PROBLEMS (unusual diarrhea, constipation, pain near the anus) TENDERNESS IN MOUTH AND THROAT WITH OR WITHOUT PRESENCE OF ULCERS (sore throat, sores in mouth, or a toothache) UNUSUAL RASH, SWELLING OR PAIN  UNUSUAL VAGINAL DISCHARGE OR ITCHING   Items with * indicate a potential emergency and should be followed up as soon as possible or go to the Emergency Department if any problems should occur.  Please show the CHEMOTHERAPY ALERT CARD or IMMUNOTHERAPY ALERT CARD at check-in to the  Emergency Department and triage nurse.  Should you have questions after your visit or need to cancel or reschedule your appointment, please contact [facility]  Dept: [phone]  and follow the prompts.  Office hours are 8:00 a.m. to 4:30 p.m. Monday - Friday. Please note that voicemails left after 4:00 p.m. may not be returned until the following business day.  We are closed weekends and major holidays. You have access to a nurse at all times for urgent questions. Please call the main number to the clinic Dept: [phone] and follow the prompts.   For any non-urgent questions, you may also contact your provider using MyChart. We now offer e-Visits for anyone 18 and older to request care online for non-urgent symptoms. For details visit [email or web address].   Also download the MyChart app! Go to the app store, search "MyChart", open the app, select [facility], and log in with your MyChart username and password.  Due to Covid, a mask is required upon entering the hospital/clinic. If you do not have a mask, one will be given to you upon arrival. For doctor visits, patients may have 1 support person aged 18 or older with them. For treatment visits, patients cannot have anyone with them due to current Covid guidelines and our immunocompromised population.   

## 2021-06-11 NOTE — Telephone Encounter (Signed)
Pt called and per Lexine Baton, NP pt to [facility] 2 morphine in the AM and 2 in the PM, rollator ordered and oxycodone sent into pharmacy. Pt verbalized understanding of how/when to take medications. No further needs at this time.

## 2021-06-11 NOTE — Progress Notes (Signed)
[facility] CSW Progress Note  Holiday representative  received an email from [employer] of their intention  to take care of 2 months of rent for pt.  Pt informed as well as [employer].  Contact information for [employer] provided to [employer] to facilitate payment.  CSW to remain available to continue to assist patient as appropriate.     Henriette Combs, LCSW    Patient is participating in a Managed Medicaid Plan:  Yes

## 2021-06-11 NOTE — Progress Notes (Signed)
[facility]  [phone] [phone]   Name: Raymond [name] Date: 06/11/2021 MRN: [id number]  DOB: 07-28-1960  Patient Care Team: Benito Mccreedy, MD as PCP - General (Internal Medicine) Valrie Hart, RN as Oncology Nurse Navigator (Oncology) Conception Chancy, PsyD (Psychology)    REASON FOR CONSULTATION: Raymond [name] is a 61 y.o. male with medical history including stage IV non-small cell lung cancer, adenocarcinoma of the left lower lobe, mediastinal lymphadenopathy with brain metastasis, s/p 5 cycles of carboplatin, Keytruda, and Alimta, stereotactic radiosurgery to brain mets, hypertension, and GERD.  Recent MRI on (01/21/21) showed significant increase in enhancing metastatic cerebral and cerebellar hemisphere lesions. Now undergoing a total of 10 whole brain radiation treatments. Palliative ask to see for ongoing symptom management support and goals of care.    SOCIAL HISTORY:     reports that he has been smoking cigarettes. He has never used smokeless tobacco. He reports that he does not drink alcohol and does not use drugs.  ADVANCE DIRECTIVES:  None on file. Education provided. [name] aware his wife, [name] is his medical decision maker in the setting of no document. He was given an advanced directive packet with plans to complete.   CODE STATUS: Full code   ALLERGIES:  is allergic to benadryl [diphenhydramine].  MEDICATIONS:  Current Outpatient Medications  Medication Sig Dispense Refill   Oxycodone HCl 10 MG TABS Take 1 tablet (10 mg total) by mouth every 4 (four) hours as needed for severe pain or breakthrough pain. 15 tablet 0   acetaminophen (TYLENOL) 650 MG CR tablet Take 650 mg by mouth every 8 (eight) hours as needed for pain.     Ensure Plus (ENSURE PLUS) LIQD Take 237 mLs by mouth 2 (two) times daily between meals. Chocolate     fluticasone (FLONASE) 50 MCG/ACT nasal spray Place 1 spray into  both nostrils daily. 16 g 2   folic acid (FOLVITE) 1 MG tablet Take 1 tablet by mouth daily. 90 tablet 0   gabapentin (NEURONTIN) 300 MG capsule Take 2 capsules (600 mg total) by mouth at bedtime. 60 capsule 0   memantine (NAMENDA) 10 MG tablet Take 1 tablet (10 mg total) by mouth 2 (two) times daily. 60 tablet 4   mirtazapine (REMERON) 30 MG tablet Take 1 tablet (30 mg total) by mouth at bedtime. 30 tablet 0   morphine (MS CONTIN) 15 MG 12 hr tablet Take 1 tablet by mouth every 12  hours. 60 tablet 0   Multiple Vitamin (MULTIVITAMIN WITH MINERALS) TABS tablet Take 1 tablet by mouth daily. (Patient taking differently: Take 1 tablet by mouth 2 (two) times daily. Ultimate Man)     ondansetron (ZOFRAN) 8 MG tablet Take 1 tablet (8 mg total) by mouth every 8 (eight) hours as needed for nausea or vomiting. 60 tablet 2   prochlorperazine (COMPAZINE) 10 MG tablet Take 1 tablet (10 mg total) by mouth every 6 (six) hours as needed for nausea or vomiting. 90 tablet 2   senna-docusate (SENNA S) 8.6-50 MG tablet Take 2 tablets by mouth daily. 60 tablet 2   vitamin B-12 (CYANOCOBALAMIN) 1000 MCG tablet Take 1,000 mcg by mouth daily.     Wheat Dextrin (BENEFIBER DRINK MIX) PACK Take 1 Package by mouth daily at 6 (six) AM.     No current facility-administered medications for this visit.    VITAL SIGNS: There were no vitals taken for this visit. There were no  vitals filed for this visit.    Estimated body mass index is 31.34 kg/m as calculated from the following:   Height as of 05/25/21: 5\' 11"  (1.803 m).   Weight as of 06/09/21: 224 lb 11.2 oz (101.9 kg).   PERFORMANCE STATUS (ECOG) : 2 - Symptomatic, <50% confined to bed  Physical Exam General: NAD, in wheelchair Cardiovascular: RRR Pulmonary: normal breathing pattern  Extremities: no edema, moves all extremity  Skin: warm, dry, flaky Neurological: AAO x4, mood appropriate, tearful   IMPRESSION:  I initially connected with Mr. Jay during his  infusion today for symptom management follow-up. No family present. He is sitting in recliner. Appears uncomfortable occasionally grimacing. States he gets short of breath when the pain hits him out of nowhere. Bulk of his pain is on his right side/flank area. Several falls over the past 2 weeks which he states is due to his lack of surroundings. He is ambulatory with a 4 prong cane however at times is weak and unable to maintain balance causing him to have unsteady gait. Appetite has slightly improved however he has to push himself to eat.   Pain related to neoplasm  [name] continues to endorse ongoing pain concerns which he feels is nonstop and has increased in intensity. Does acknowledge some of the discomfort is related to his recent fall. Last visit we started him on MS Contin for better long-acting pain control however he states he can only tell a minimal difference in pain. He also has oxycodone as needed for breakthrough pain.   [name] is reporting he is out of his Oxycodone. I had an open and honest discussion with him expressing our goal is to assist in symptom management and we do not want patient's in pain however we also must be cautious in how we prescribed and how much medication he is using. He verbalized understanding. He was sent in a new prescription for 120 tablets of Oxycodone 10 mg on 06/02/21 per Bryson Ha, PA with directions to take 0.5-1 tablets every 4 hours as needed. I explained if he was taking max dose of 1 tablet (totaling 6 pills/day) this would provide him with a 20 day supply which does not align with his reports of being completely out since 6/7. He remains pleasant in the conversation expressing the severity of his pain emphasizing that the addition of the MS Contin did not provide him any better relief. I acknowledge his statement also reminding him that he was advised to contact our office if pain persisted which he had not done. He verbalized understanding expressing he  felt that we had an honest relationship and he know that we are only trying to help him.   [name] becomes tearful sharing his sadness in staff not trusting him and contacting his wife. I shared that the medical team wants to ensure he is taking medications appropriately and in a safe manner. Assuring him that he has legitimate reasoning for his pain. Given his emotional responses I discontinued discussions during his infusion and asked once treatment was completed to come over and have a private visit where we could speak more openly.   I continued discussions in private exam room after his treatment completed. Mr. Bonura shares he has time to think emphasizing his value in our relationship and the care his cancer center team is providing. He reports his morphine is not helping with his pain. He is taking as prescribed and does have sufficient amount left to support this. 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 He feels that because of his extensive use of "street drugs" may be contributing to his intolerance sharing openly that he would crush morphine everyday and snort while drinking hard liquor and it never once phased him. He was able to still function. He denies doing that with any of his current medications speaking to his thankfulness of recovering from his past lifestyles mentioning his now strong faith in God.   He proceeds to tell me that he has indeed been misusing his oxycodone not for any specific high reasons but because his pain has been so severe to the point he could barely care for himself that he was trying to medicate and provide himself some relief. He reports taking anywhere from 3-4 pills at a time but no more than every 4-5 hours in frequency. He becomes tearful expressing no one understands how severe his pain is and he didn't mean to do anything wrong however it was go the hospital or try to stay  home and deal with the discomfort. He mentions he does not like being in the hospital.   I offered support to Mr. Marcott but also had a direct and open discussion with him that the appropriate way to handle his pain concerns would have been to reach out to his providers and share needs allowing for professional guidance to his medications. Unfortunately this did not occur and I have advised him that his prescription would not be filled at this time for another full prescription. I will however provide him with medication to hopefully get him through the weekend as I consider appropriate and safe regimen for him. I provided direct instructions that he is not to make adjustments to his medications without medical advisement.   Given misuse and medical history in the future he will require extensive pain contact and limited medication prescribed at any given time. He will continue with MS Contin as instructed until further notice. Contract to include drug testing, medications prescribed in intervals, patient to be seen prior to next refill and must bring all medications to undergo pill count.   We will continue to closely monitor. Mr. Rockett may benefit from methadone or fentanyl patch use in a controlled setting. Would also request involvement from his wife in medication management.   Constipation  Is taking Miralax and reports improvement. Emphasized importance of bowel regimen to prevent further complications and in addition to the use of opioids. Encouraged water intake.    Goals of Care  Mr. Mccaslin expresses his awareness that his health continues to be a challenge. I created space and opportunity encouraging him to explain how he felt but most importantly to assess his awareness of his overall condition. He is able to appropriately express awareness of his cancer progression. He is remaining hopeful for some stability and maybe even a "miracle" as he speaks of his strong faith in God performing on  his behalf. He becomes emotional expressing his feelings of only having sufficient support from the medical team here as he has limited support in the home. Openly sharing he and his wife had an argument this morning because her mother passed away a week ago and he was unable to "show up" around family as a strong man. Also she had a flat tire on this morning and fussed at him because he could not fix it  or place spare on. Emotional support provided. He is tearful expressing feelings of trying to survive his cancer while trying to be the man his wife married and wish that he would return to knowing this most likely will never happen! He mentions they also had an argument when he expressed if he continued to not do well he would consider hospice and wife told him he was "weak".   I created space to explore more on his thoughts regarding hospice. He states he is not ready to die but knows that at some point he will. He is clear in his expressed wishes to continue to treat aggressively acknowledging some improvement since starting radiation in conjunction with his treatments. He prefers to think about the positives and remain hopefull versus thinking about the worst. We discussed his quality of life which he feels is "not the best" however does not want to give up as his Oncology team is treating him and this gives him the hope that maybe things may at least be maintained and allow him to continue to thrive. I discussed with him regarding his progression and worst case scenario if treatment is no longer an option or his cancer continues to progress. Siah says he tries not to think about that but again emphasizes his peace with God and proud feelings of how he turned his life around. States if God called him home today although he isn't ready mentally to go knows his body most likely is and is growing tired despite how much he wants to push. He becomes tearful and repeating he is tired but has to "keep pressing on"  Emotional support provided. I attempted to discuss with Camaron if his health continued to decline and symptom burden controls his life it is ok to consider other options. Encouraged him to consider what his true limitations would be. We discussed if at any point he chose to focus on his comfort it did not mean he was a quitter but still a fighter however we just have changed the course of what he is fighting for. He would be fighting for his peace and comfort. He verbalized his understanding and appreciation of ongoing support.   I discussed the importance of continued conversation with family and their medical providers regarding overall plan of care and treatment options, ensuring decisions are within the context of the patients values and GOCs.  PLAN:  MS Contin 30 mg twice daily. Will continue to closely monitor.  Oxy IR 10 mg every 4-6 hours as needed for pain. Will continue to closely evaluate. Patient given 15 tablets to get him through weekend until we are able to established strict pain contract and adjust medications appropriately. See above discussions  Neurontin 600 mg at bedtime, will continue to evaluate and increase dose for added support.  Miralax daily  Senna-S daily  Ongoing goals of care discussions. Clear in expressed goals to continue to treat the treatable as long as there are available options. He is active with Dr. Michail Sermon and Social Work team for support.  I will plan to see patient back next week in collaboration to other oncology appointments.    Patient expressed understanding and was in agreement with this plan. He also understands that He can call the clinic at any time with any questions, concerns, or complaints.   Number and complexity of problems addressed: 3 HIGH - 1 or more chronic illnesses with SEVERE exacerbation, progression, or side effects of treatment - advanced cancer, pain. Any controlled  substances utilized were prescribed in the context of palliative  care.   Time Total: 95 min   Visit consisted of counseling and education dealing with the complex and emotionally intense issues of symptom management and palliative care in the setting of serious and potentially life-threatening illness.Greater than 50%  of this time was spent counseling and coordinating care related to the above assessment and plan.  [name], AGPCNP-BC  Palliative Medicine Team/[facility]

## 2021-06-12 ENCOUNTER — Other Ambulatory Visit (HOSPITAL_COMMUNITY): Payer: Self-pay

## 2021-06-14 ENCOUNTER — Ambulatory Visit: Payer: Medicaid Other

## 2021-06-14 ENCOUNTER — Emergency Department (HOSPITAL_COMMUNITY): Payer: Medicaid Other

## 2021-06-14 ENCOUNTER — Inpatient Hospital Stay: Payer: Medicaid Other

## 2021-06-14 ENCOUNTER — Inpatient Hospital Stay (HOSPITAL_COMMUNITY): Payer: Medicaid Other

## 2021-06-14 ENCOUNTER — Other Ambulatory Visit: Payer: Self-pay

## 2021-06-14 ENCOUNTER — Encounter (HOSPITAL_COMMUNITY): Payer: Self-pay | Admitting: Emergency Medicine

## 2021-06-14 ENCOUNTER — Inpatient Hospital Stay (HOSPITAL_COMMUNITY)
Admission: EM | Admit: 2021-06-14 | Discharge: 2021-06-16 | DRG: 064 | Disposition: A | Discharge status: Hospice | Payer: Medicaid Other | Attending: Internal Medicine | Admitting: Internal Medicine

## 2021-06-14 DIAGNOSIS — R29810 Facial weakness: Secondary | ICD-10-CM | POA: Diagnosis present

## 2021-06-14 DIAGNOSIS — Z66 Do not resuscitate: Secondary | ICD-10-CM | POA: Diagnosis not present

## 2021-06-14 DIAGNOSIS — Z515 Encounter for palliative care: Secondary | ICD-10-CM | POA: Diagnosis not present

## 2021-06-14 DIAGNOSIS — R414 Neurologic neglect syndrome: Secondary | ICD-10-CM | POA: Diagnosis present

## 2021-06-14 DIAGNOSIS — I959 Hypotension, unspecified: Secondary | ICD-10-CM | POA: Diagnosis present

## 2021-06-14 DIAGNOSIS — F32A Depression, unspecified: Secondary | ICD-10-CM | POA: Diagnosis present

## 2021-06-14 DIAGNOSIS — K219 Gastro-esophageal reflux disease without esophagitis: Secondary | ICD-10-CM | POA: Diagnosis present

## 2021-06-14 DIAGNOSIS — C3412 Malignant neoplasm of upper lobe, left bronchus or lung: Secondary | ICD-10-CM | POA: Diagnosis present

## 2021-06-14 DIAGNOSIS — I10 Essential (primary) hypertension: Secondary | ICD-10-CM | POA: Diagnosis present

## 2021-06-14 DIAGNOSIS — G894 Chronic pain syndrome: Secondary | ICD-10-CM | POA: Diagnosis present

## 2021-06-14 DIAGNOSIS — N400 Enlarged prostate without lower urinary tract symptoms: Secondary | ICD-10-CM | POA: Diagnosis present

## 2021-06-14 DIAGNOSIS — Z7189 Other specified counseling: Secondary | ICD-10-CM

## 2021-06-14 DIAGNOSIS — R4701 Aphasia: Secondary | ICD-10-CM | POA: Diagnosis present

## 2021-06-14 DIAGNOSIS — G936 Cerebral edema: Secondary | ICD-10-CM | POA: Diagnosis present

## 2021-06-14 DIAGNOSIS — G9340 Encephalopathy, unspecified: Secondary | ICD-10-CM | POA: Diagnosis present

## 2021-06-14 DIAGNOSIS — I252 Old myocardial infarction: Secondary | ICD-10-CM

## 2021-06-14 DIAGNOSIS — C7951 Secondary malignant neoplasm of bone: Secondary | ICD-10-CM | POA: Diagnosis present

## 2021-06-14 DIAGNOSIS — F17210 Nicotine dependence, cigarettes, uncomplicated: Secondary | ICD-10-CM | POA: Diagnosis present

## 2021-06-14 DIAGNOSIS — I6389 Other cerebral infarction: Secondary | ICD-10-CM | POA: Diagnosis not present

## 2021-06-14 DIAGNOSIS — G928 Other toxic encephalopathy: Secondary | ICD-10-CM | POA: Diagnosis present

## 2021-06-14 DIAGNOSIS — Z888 Allergy status to other drugs, medicaments and biological substances status: Secondary | ICD-10-CM

## 2021-06-14 DIAGNOSIS — Z79891 Long term (current) use of opiate analgesic: Secondary | ICD-10-CM

## 2021-06-14 DIAGNOSIS — Z801 Family history of malignant neoplasm of trachea, bronchus and lung: Secondary | ICD-10-CM

## 2021-06-14 DIAGNOSIS — T50915A Adverse effect of multiple unspecified drugs, medicaments and biological substances, initial encounter: Secondary | ICD-10-CM | POA: Diagnosis present

## 2021-06-14 DIAGNOSIS — Z8249 Family history of ischemic heart disease and other diseases of the circulatory system: Secondary | ICD-10-CM

## 2021-06-14 DIAGNOSIS — Z79899 Other long term (current) drug therapy: Secondary | ICD-10-CM

## 2021-06-14 DIAGNOSIS — E119 Type 2 diabetes mellitus without complications: Secondary | ICD-10-CM | POA: Diagnosis present

## 2021-06-14 DIAGNOSIS — M1990 Unspecified osteoarthritis, unspecified site: Secondary | ICD-10-CM | POA: Diagnosis present

## 2021-06-14 DIAGNOSIS — R29723 NIHSS score 23: Secondary | ICD-10-CM | POA: Diagnosis present

## 2021-06-14 DIAGNOSIS — Z8719 Personal history of other diseases of the digestive system: Secondary | ICD-10-CM

## 2021-06-14 DIAGNOSIS — D630 Anemia in neoplastic disease: Secondary | ICD-10-CM | POA: Diagnosis present

## 2021-06-14 DIAGNOSIS — Z6832 Body mass index (BMI) 32.0-32.9, adult: Secondary | ICD-10-CM

## 2021-06-14 DIAGNOSIS — Z85118 Personal history of other malignant neoplasm of bronchus and lung: Secondary | ICD-10-CM

## 2021-06-14 DIAGNOSIS — I63511 Cerebral infarction due to unspecified occlusion or stenosis of right middle cerebral artery: Secondary | ICD-10-CM | POA: Diagnosis present

## 2021-06-14 DIAGNOSIS — G8194 Hemiplegia, unspecified affecting left nondominant side: Secondary | ICD-10-CM | POA: Diagnosis present

## 2021-06-14 DIAGNOSIS — G3184 Mild cognitive impairment, so stated: Secondary | ICD-10-CM | POA: Diagnosis not present

## 2021-06-14 DIAGNOSIS — G893 Neoplasm related pain (acute) (chronic): Secondary | ICD-10-CM | POA: Diagnosis present

## 2021-06-14 DIAGNOSIS — R4182 Altered mental status, unspecified: Secondary | ICD-10-CM | POA: Diagnosis not present

## 2021-06-14 DIAGNOSIS — R1310 Dysphagia, unspecified: Secondary | ICD-10-CM | POA: Diagnosis present

## 2021-06-14 DIAGNOSIS — E876 Hypokalemia: Secondary | ICD-10-CM | POA: Diagnosis present

## 2021-06-14 DIAGNOSIS — D696 Thrombocytopenia, unspecified: Secondary | ICD-10-CM | POA: Diagnosis present

## 2021-06-14 DIAGNOSIS — E669 Obesity, unspecified: Secondary | ICD-10-CM | POA: Diagnosis present

## 2021-06-14 DIAGNOSIS — I639 Cerebral infarction, unspecified: Secondary | ICD-10-CM | POA: Diagnosis not present

## 2021-06-14 DIAGNOSIS — C7931 Secondary malignant neoplasm of brain: Secondary | ICD-10-CM | POA: Diagnosis present

## 2021-06-14 LAB — HEMOGLOBIN A1C
Hgb A1c MFr Bld: 4.9 % (ref 4.8–5.6)
Mean Plasma Glucose: 93.93 mg/dL

## 2021-06-14 LAB — URINALYSIS, ROUTINE W REFLEX MICROSCOPIC
Bilirubin Urine: NEGATIVE
Glucose, UA: NEGATIVE mg/dL
Hgb urine dipstick: NEGATIVE
Ketones, ur: 20 mg/dL — AB
Leukocytes,Ua: NEGATIVE
Nitrite: NEGATIVE
Protein, ur: NEGATIVE mg/dL
Specific Gravity, Urine: >1.046 — ABNORMAL HIGH (ref 1.005–1.030)
pH: 5 (ref 5.0–8.0)

## 2021-06-14 LAB — DIFFERENTIAL
Abs Immature Granulocytes: 0.05 10*3/uL (ref 0.00–0.07)
Basophils Absolute: 0 10*3/uL (ref 0.0–0.1)
Basophils Relative: 0 %
Eosinophils Absolute: 0.1 10*3/uL (ref 0.0–0.5)
Eosinophils Relative: 1 %
Immature Granulocytes: 0 %
Lymphocytes Relative: 4 %
Lymphs Abs: 0.4 10*3/uL — ABNORMAL LOW (ref 0.7–4.0)
Monocytes Absolute: 0 10*3/uL — ABNORMAL LOW (ref 0.1–1.0)
Monocytes Relative: 0 %
Neutro Abs: 11 10*3/uL — ABNORMAL HIGH (ref 1.7–7.7)
Neutrophils Relative %: 95 %

## 2021-06-14 LAB — COMPREHENSIVE METABOLIC PANEL
ALT: 20 U/L (ref 0–44)
AST: 31 U/L (ref 15–41)
Albumin: 2.7 g/dL — ABNORMAL LOW (ref 3.5–5.0)
Alkaline Phosphatase: 152 U/L — ABNORMAL HIGH (ref 38–126)
Anion gap: 13 (ref 5–15)
BUN: 16 mg/dL (ref 6–20)
CO2: 21 mmol/L — ABNORMAL LOW (ref 22–32)
Calcium: 9.4 mg/dL (ref 8.9–10.3)
Chloride: 107 mmol/L (ref 98–111)
Creatinine, Ser: 0.98 mg/dL (ref 0.61–1.24)
GFR, Estimated: >60 mL/min (ref >60)
Glucose, Bld: 155 mg/dL — ABNORMAL HIGH (ref 70–99)
Potassium: 3.2 mmol/L — ABNORMAL LOW (ref 3.5–5.1)
Sodium: 141 mmol/L (ref 135–145)
Total Bilirubin: 1.4 mg/dL — ABNORMAL HIGH (ref 0.3–1.2)
Total Protein: 6.1 g/dL — ABNORMAL LOW (ref 6.5–8.1)

## 2021-06-14 LAB — CBC
HCT: 31.1 % — ABNORMAL LOW (ref 39.0–52.0)
Hemoglobin: 9.6 g/dL — ABNORMAL LOW (ref 13.0–17.0)
MCH: 28.7 pg (ref 26.0–34.0)
MCHC: 30.9 g/dL (ref 30.0–36.0)
MCV: 93.1 fL (ref 80.0–100.0)
Platelets: 50 10*3/uL — ABNORMAL LOW (ref 150–400)
RBC: 3.34 MIL/uL — ABNORMAL LOW (ref 4.22–5.81)
RDW: 14.2 % (ref 11.5–15.5)
WBC: 11.6 10*3/uL — ABNORMAL HIGH (ref 4.0–10.5)
nRBC: 0 % (ref 0.0–0.2)

## 2021-06-14 LAB — AMMONIA: Ammonia: 34 umol/L (ref 9–35)

## 2021-06-14 LAB — I-STAT CHEM 8, ED
BUN: 16 mg/dL (ref 6–20)
Calcium, Ion: 1.14 mmol/L — ABNORMAL LOW (ref 1.15–1.40)
Chloride: 106 mmol/L (ref 98–111)
Creatinine, Ser: 0.7 mg/dL (ref 0.61–1.24)
Glucose, Bld: 151 mg/dL — ABNORMAL HIGH (ref 70–99)
HCT: 29 % — ABNORMAL LOW (ref 39.0–52.0)
Hemoglobin: 9.9 g/dL — ABNORMAL LOW (ref 13.0–17.0)
Potassium: 3.1 mmol/L — ABNORMAL LOW (ref 3.5–5.1)
Sodium: 140 mmol/L (ref 135–145)
TCO2: 21 mmol/L — ABNORMAL LOW (ref 22–32)

## 2021-06-14 LAB — PROTIME-INR
INR: 1.3 — ABNORMAL HIGH (ref 0.8–1.2)
Prothrombin Time: 15.9 seconds — ABNORMAL HIGH (ref 11.4–15.2)

## 2021-06-14 LAB — CBG MONITORING, ED: Glucose-Capillary: 155 mg/dL — ABNORMAL HIGH (ref 70–99)

## 2021-06-14 LAB — APTT: aPTT: 26 seconds (ref 24–36)

## 2021-06-14 LAB — TSH: TSH: 0.542 u[IU]/mL (ref 0.350–4.500)

## 2021-06-14 LAB — MAGNESIUM: Magnesium: 1.9 mg/dL (ref 1.7–2.4)

## 2021-06-14 LAB — VITAMIN B12: Vitamin B-12: 1798 pg/mL — ABNORMAL HIGH (ref 180–914)

## 2021-06-14 MED ORDER — ACETAMINOPHEN 325 MG PO TABS
650.0000 mg | ORAL_TABLET | ORAL | Status: DC | PRN
Start: 2021-06-14 — End: 2021-06-15

## 2021-06-14 MED ORDER — IOHEXOL 350 MG/ML SOLN
75.0000 mL | Freq: Once | INTRAVENOUS | Status: AC | PRN
  Administered 2021-06-14: 75 mL via INTRAVENOUS

## 2021-06-14 MED ORDER — POTASSIUM CHLORIDE 10 MEQ/100ML IV SOLN
10.0000 meq | Freq: Once | INTRAVENOUS | Status: AC
Start: 2021-06-14 — End: 2021-06-15
  Administered 2021-06-14: 10 meq via INTRAVENOUS
  Filled 2021-06-14: qty 100

## 2021-06-14 MED ORDER — SODIUM CHLORIDE 0.9% FLUSH: 3.0000 mL | Freq: Once | INTRAVENOUS | Status: DC

## 2021-06-14 MED ORDER — ACETAMINOPHEN 160 MG/5ML PO SOLN: 650.0000 mg | ORAL | Status: DC | PRN

## 2021-06-14 MED ORDER — ACETAMINOPHEN 650 MG RE SUPP: 650.0000 mg | RECTAL | Status: DC | PRN

## 2021-06-14 MED ORDER — SODIUM CHLORIDE 0.9 % IV SOLN: INTRAVENOUS | Status: AC

## 2021-06-14 MED ORDER — STROKE: EARLY STAGES OF RECOVERY BOOK: Freq: Once | Status: DC

## 2021-06-14 MED ORDER — GADOBUTROL 1 MMOL/ML IV SOLN
10.0000 mL | Freq: Once | INTRAVENOUS | Status: AC | PRN
Start: 2021-06-14 — End: 2021-06-14
  Administered 2021-06-14: 10 mL via INTRAVENOUS

## 2021-06-14 MED ORDER — POTASSIUM CHLORIDE 10 MEQ/100ML IV SOLN
10.0000 meq | INTRAVENOUS | Status: AC
  Administered 2021-06-14: 10 meq via INTRAVENOUS
  Filled 2021-06-14: qty 100

## 2021-06-14 NOTE — Assessment & Plan Note (Signed)
Was hypotensive/soft on initial vitals, but improved. Will allow for permissive HTN in setting of possible CVA IV parameters PRN if needed

## 2021-06-14 NOTE — H&P (Signed)
History and Physical    Patient: Raymond [name] JGG:[id number] DOB: 25-Dec-1960 DOA: 06/14/2021 DOS: the patient was seen and examined on 06/14/2021 PCP: Benito Mccreedy, MD  Patient coming from: Home - lives at home with his wife.    Chief Complaint: AMS  HPI: Raymond [name] is a 61 y.o. 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 Drain was removed and he was placed on IV abx and discharged on augment. He presents to ED today for AMS. EMS found him unresponsive. Per notes he could not move left side and code stroke was called. On arrival eyes open, but not tracking and aphasic. Was moving all 4 extremities.   His wife is at bedside and gives history.  She states at baseline he is ambulatory with a 4 prong cane and is fluent in his speech. She wasn't at home this morning when the the transportation team came, but they called her and told her he was unresponsive and wouldn't talk. She is concerned about him taking too many pain pills as he hides the pills and is in a lot of pain and she is afraid he is taking too many. She is trying to find the bottle to see how many are left.   He has had poor PO intake lately. Only drinking ensure and water. She denies any recent illness, fevers. He has not complained of any shortness of breath, cough or stomach pain to her.   He does not smoke, past history and does not drink any alcohol.    ER Course:  vitals: temp: 96.6>97.3, bp: 124/81, HR; 119, RR: 20, oxygen: 96%RA Pertinent labs: WBC: 11.6, hgb: 9.6, platelets of 50, potassium: 3.2, albumin: 2.7,  CT head: minimal edema adjacent to left insular lesion, small metastases identified by MRI not seen on CT CTA head/neck: no LVO. No  significant disease in the neck. Extensive osseous metastases with destruction of the posterior cortex at T3 and tumor extending into spinal cord. Left perihilar lung mass extends to the chest wall measuring over 6cm. Numerous pulm mets.  In ED: code stroke initiated. MRI brain ordered.    Review of Systems: unable to review all systems due to the inability of the patient to answer questions. Past Medical History:  Diagnosis Date   Arthritis    Baker's cyst    right   Diverticulitis    GERD (gastroesophageal reflux disease)    Heart attack ([place]) 01/04/2004   History of hiatal hernia    Hypertension    Malignant neoplasm of upper lobe of left lung ([place]) 09/24/2020   Pneumonia    as a child   Sleep apnea    no longer on Cpap   Past Surgical History:  Procedure Laterality Date   BIOPSY  01/26/2021   Procedure: BIOPSY;  Surgeon: Ronnette Juniper, MD;  Location: [facility];  Service: Gastroenterology;;   BRONCHIAL BIOPSY  09/14/2020   Procedure: BRONCHIAL BIOPSIES;  Surgeon: Collene Gobble, MD;  Location: [place];  Service: Cardiopulmonary;;   BRONCHIAL BRUSHINGS  09/14/2020   Procedure: BRONCHIAL BRUSHINGS;  Surgeon: Collene Gobble, MD;  Location: [place];  Service: Cardiopulmonary;;   BRONCHIAL NEEDLE ASPIRATION BIOPSY  09/14/2020   Procedure: BRONCHIAL NEEDLE ASPIRATION BIOPSIES;  Surgeon: Lamonte Sakai,  Rose Fillers, MD;  Location: [facility] ENDOSCOPY;  Service: Cardiopulmonary;;   CARDIAC CATHETERIZATION     CARPAL TUNNEL RELEASE     right   COLONOSCOPY WITH PROPOFOL N/A 01/26/2021   Procedure: COLONOSCOPY WITH PROPOFOL;  Surgeon: Ronnette Juniper, MD;  Location: [facility];  Service: Gastroenterology;  Laterality: N/A;   FIDUCIAL MARKER PLACEMENT  09/14/2020   Procedure: FIDUCIAL MARKER PLACEMENT;  Surgeon: Collene Gobble, MD;  Location: [place];  Service: Cardiopulmonary;;   HAND SURGERY     left   IR CATHETER TUBE CHANGE  03/19/2021   IR CATHETER TUBE CHANGE  04/30/2021   IR FLUORO RM  30-60 MIN  03/18/2021   IR RADIOLOGIST EVAL & MGMT  04/07/2021   KNEE SURGERY     left   VIDEO BRONCHOSCOPY N/A 09/14/2020   Procedure: ROBOTIC VIDEO BRONCHOSCOPY WITH FLUORO;  Surgeon: Collene Gobble, MD;  Location: [place];  Service: Cardiopulmonary;  Laterality: N/A;   VIDEO BRONCHOSCOPY WITH ENDOBRONCHIAL ULTRASOUND N/A 09/14/2020   Procedure: VIDEO BRONCHOSCOPY WITH ENDOBRONCHIAL ULTRASOUND;  Surgeon: Collene Gobble, MD;  Location: [place];  Service: Cardiopulmonary;  Laterality: N/A;   VIDEO BRONCHOSCOPY WITH RADIAL ENDOBRONCHIAL ULTRASOUND  09/14/2020   Procedure: VIDEO BRONCHOSCOPY WITH RADIAL ENDOBRONCHIAL ULTRASOUND;  Surgeon: Collene Gobble, MD;  Location: [facility];  Service: Cardiopulmonary;;   Social History:  reports that he has been smoking cigarettes. He has never used smokeless tobacco. He reports that he does not drink alcohol and does not use drugs.  Allergies  Allergen Reactions   Benadryl [Diphenhydramine] Itching    01/24/21 pt states this was a one time reaction and that he takes now when needed, also reported 05/12/21    Family History  Problem Relation Age of Onset   Lung cancer Mother    Heart failure Sister     Prior to Admission medications   Medication Sig Start Date End Date Taking? Authorizing Provider  acetaminophen (TYLENOL) 650 MG CR tablet Take 650 mg by mouth every 8 (eight) hours as needed for pain.    [provider]  Ensure Plus (ENSURE PLUS) LIQD Take 237 mLs by mouth 2 (two) times daily between meals. Chocolate    [provider]  fluticasone (FLONASE) 50 MCG/ACT nasal spray Place 1 spray into both nostrils daily. 03/16/21   Pickenpack-Cousar, Carlena Sax, NP  folic acid (FOLVITE) 1 MG tablet Take 1 tablet by mouth daily. 05/17/21 08/15/21  Mercy Riding, MD  gabapentin (NEURONTIN) 300 MG capsule Take 2 capsules (600 mg total) by mouth at bedtime. 05/25/21 06/25/21  Pickenpack-Cousar, Carlena Sax, NP  memantine (NAMENDA) 10 MG  tablet Take 1 tablet (10 mg total) by mouth 2 (two) times daily. 01/26/21   [name], PA-C  mirtazapine (REMERON) 30 MG tablet Take 1 tablet (30 mg total) by mouth at bedtime. 04/13/21   [name], [name], PA-C  morphine (MS CONTIN) 15 MG 12 hr tablet Take 1 tablet by mouth every 12  hours. 05/25/21   Pickenpack-Cousar, Carlena Sax, NP  Multiple Vitamin (MULTIVITAMIN WITH MINERALS) TABS tablet Take 1 tablet by mouth daily. Patient taking differently: Take 1 tablet by mouth 2 (two) times daily. Ultimate Man 03/10/21   Mercy Riding, MD  ondansetron (ZOFRAN) 8 MG tablet Take 1 tablet (8 mg total) by mouth every 8 (eight) hours as needed for nausea or vomiting. 04/13/21   Pickenpack-Cousar, Carlena Sax, NP  Oxycodone HCl 10 MG TABS Take 1 tablet (10 mg total) by mouth  every 4 (four) hours as needed for severe pain or breakthrough pain. 06/11/21   Pickenpack-Cousar, Carlena Sax, NP  prochlorperazine (COMPAZINE) 10 MG tablet Take 1 tablet (10 mg total) by mouth every 6 (six) hours as needed for nausea or vomiting. 02/25/21   Pickenpack-Cousar, Carlena Sax, NP  senna-docusate (SENNA S) 8.6-50 MG tablet Take 2 tablets by mouth daily. 04/13/21   Pickenpack-Cousar, Carlena Sax, NP  vitamin B-12 (CYANOCOBALAMIN) 1000 MCG tablet Take 1,000 mcg by mouth daily.    [provider]  Wheat Dextrin (BENEFIBER DRINK [place]) PACK Take 1 Package by mouth daily at 6 (six) AM.    [provider]    Physical Exam: Vitals:   06/14/21 1820 06/14/21 1823 06/14/21 1828 06/14/21 1830  BP: 135/82   134/75  Pulse: 60 97  99  Resp:  15  20  Temp:   (!) 97.3 F (36.3 C)   TempSrc:   Temporal   SpO2: (!) 82% 93%  93%  Weight:       General:  Appears calm and comfortable and is in NAD. Will open eyes and mumble.  Eyes:  PERRL, EOMI, normal lids, iris ENT:  grossly normal hearing, lips & tongue, mmm; appropriate dentition Neck:  no LAD, masses or thyromegaly; no carotid bruits Cardiovascular:  RRR, no m/r/g.  No LE edema.  Respiratory:   CTA bilaterally with no wheezes/rales/rhonchi.  Normal respiratory effort. Abdomen:  soft, NT, ND, NABS Back:   normal alignment, no CVAT Skin:  no rash or induration seen on limited exam Musculoskeletal:  will move RUE/RLE. Will shake his left leg and will not move his left upper extremity at all. No bony abnormality Lower extremity:  No LE edema.  Limited foot exam with no ulcerations.  2+ distal pulses. Psychiatric: can not test. Mumbles, not intelligible  Neurologic:  can not test. Opens eyes, will not track. Moves R side. Will not move his left upper extremity. Shakes his left leg.    Radiological Exams on Admission: Independently reviewed - see discussion in A/P where applicable  DG CHEST PORT 1 VIEW  Result Date: 06/14/2021 CLINICAL DATA:  Lung cancer.  Altered mental status. EXAM: PORTABLE CHEST 1 VIEW COMPARISON:  06/09/2021 FINDINGS: Left upper lobe mass and numerous bilateral pulmonary nodules are again noted as seen on prior chest x-ray and chest CT. No significant change since prior study. Heart is normal size. No effusions or pneumothorax. No acute bony abnormality. IMPRESSION: Left upper lobe mass with numerous bilateral pulmonary metastases, unchanged. Electronically Signed   By: Rolm Baptise M.D.   On: 06/14/2021 19:02   MR THORACIC SPINE W WO CONTRAST  Result Date: 06/14/2021 CLINICAL DATA:  Brain/CNS neoplasm. Assess treatment response. History of non-small cell lung cancer. EXAM: MRI THORACIC WITHOUT AND WITH CONTRAST TECHNIQUE: Multiplanar and multiecho pulse sequences of the thoracic spine were obtained without and with intravenous contrast. CONTRAST:  70mL GADAVIST GADOBUTROL 1 MMOL/ML IV SOLN COMPARISON:  Thoracic MRI 05/20/2019. Abdominal CT 06/09/2021 and 05/12/2021. Chest CT 05/12/2021 and 04/29/2021. PET-CT 10/02/2020. FINDINGS: Alignment:  Normal. Vertebrae: There is widespread osseous metastatic disease throughout the visualized thoracic  spine as well as the lower cervical and upper lumbar spine. The osseous involvement is greatest within the T3, T4, T5 and T11 vertebral bodies. No significant pathologic fractures are seen. Cord: Normal in signal and caliber. No abnormal intradural enhancement. Paraspinal and other soft tissues: As demonstrated on previous CTs, there are multiple pulmonary nodules bilaterally, largest in the left upper  lobe. There are scattered areas of epidural tumor, most prominent anteriorly on the right at T3. No resulting cord deformity or high-grade foraminal narrowing. Disc levels: Mild multilevel spondylosis with disc bulging and facet hypertrophy, similar to previous MRI. No large disc herniation, high-grade spinal stenosis or definite nerve root encroachment. IMPRESSION: 1. Multifocal osseous metastatic disease with associated mild epidural tumor, most prominent at T3. 2. No evidence of cord compression or high-grade spinal stenosis. 3. Known widespread pulmonary metastases, grossly stable. 4. Chronic spondylosis, similar to previous MRI. Electronically Signed   By: Richardean Sale M.D.   On: 06/14/2021 18:19   MR BRAIN WO CONTRAST  Result Date: 06/14/2021 CLINICAL DATA:  Found unresponsive this morning. History of metastatic disease to the brain. EXAM: MRI HEAD WITHOUT CONTRAST TECHNIQUE: Multiplanar, multiecho pulse sequences of the brain and surrounding structures were obtained without intravenous contrast. COMPARISON:  Same-day CT/CTA head, brain MRI 05/07/2021 FINDINGS: Brain: There is cortically based diffusion restriction in the right insula and parietal cortex with associated mild FLAIR signal abnormality consistent with evolving acute infarct in the MCA distribution. There is no associated hemorrhage. Punctate foci of diffusion restriction in the pons and bilateral cerebellar hemispheres may reflect additional tiny infarcts versus signal abnormality related to known metastatic disease. The known small  metastatic lesions seen on the prior MRI from 05/07/2021 are suboptimally assessed on the current study given lack of intravenous contrast. Small areas of edema most notably in the right frontal lobe (6-24) and left insula (6-18) are noted. Scattered small foci of SWI signal dropout likely reflect intralesional hemorrhage associated with the metastatic lesions. There is no significant mass effect. There is no midline shift. Vascular: The major intracranial flow voids are normal. Skull and upper cervical spine: A few T1 hypointense lesions in the left parietal and occipital calvarium are not significantly changed going back multiple prior studies. There is no suspicious signal abnormality in the imaged upper cervical spine. Sinuses/Orbits: There is moderate mucosal thickening throughout the paranasal sinuses. Other: There are bilateral mastoid effusions, unchanged. IMPRESSION: 1. Diffusion restriction in the right insula and parietal lobe cortex consistent with evolving acute infarct in the MCA distribution. 2. Additional punctate foci of diffusion restriction in the cerebellum and pons may reflect additional tiny infarcts versus signal abnormality related to metastatic disease. 3. The known metastatic lesions are suboptimally assessed on the current study given lack of intravenous contrast. There is no significant mass effect or midline shift. These results were called by telephone at the time of interpretation on 06/14/2021 at 5:27 pm to provider Dr Francia Greaves, who verbally acknowledged these results. Electronically Signed   By: Valetta Mole M.D.   On: 06/14/2021 17:25   CT ANGIO HEAD NECK W WO CM (CODE STROKE)  Result Date: 06/14/2021 CLINICAL DATA:  Neuro deficit, acute, stroke suspected. Personal history of lung cancer. Brain metastases. EXAM: CT ANGIOGRAPHY HEAD AND NECK TECHNIQUE: Multidetector CT imaging of the head and neck was performed using the standard protocol during bolus administration of intravenous  contrast. Multiplanar CT image reconstructions and MIPs were obtained to evaluate the vascular anatomy. Carotid stenosis measurements (when applicable) are obtained utilizing NASCET criteria, using the distal internal carotid diameter as the denominator. RADIATION DOSE REDUCTION: This exam was performed according to the departmental dose-optimization program which includes automated exposure control, adjustment of the mA and/or kV according to patient size and/or use of iterative reconstruction technique. CONTRAST:  75 mL Omnipaque 350 COMPARISON:  CT head without contrast 06/14/2021. MR head without  and with contrast 05/07/2021 FINDINGS: CTA NECK FINDINGS Aortic arch: Common origin of left common carotid artery and innominate artery noted. No significant atherosclerotic disease or stenosis. Right carotid system: The right common carotid artery is within normal limits. Bifurcation is unremarkable. Cervical right ICA is within normal limits. Left carotid system: Left common carotid artery is within normal limits. Bifurcation is unremarkable. Cervical left ICA is within normal limits. Vertebral arteries: The vertebral arteries are codominant. Both vertebral arteries originate from the subclavian arteries without significant stenosis. No significant stenosis is present in either vertebral artery in the neck. Skeleton: Degenerative changes are present lower cervical spine. Multiple lucent lesions are present throughout the spine the posterior cortex is destroyed at T3 with tumor extending into the spinal canal. No pathologic fractures are present. Other neck: Soft tissues the neck are otherwise unremarkable. Salivary glands are within normal limits. Thyroid is normal. No significant adenopathy is present. No focal mucosal or submucosal lesions are present. Upper chest: Numerous pulmonary metastases are present. Left perihilar lung mass extends to the chest wall measuring over 6 cm. Extensive involvement of the left  third rib is again noted. Review of the MIP images confirms the above findings CTA HEAD FINDINGS Anterior circulation: Atherosclerotic changes are present the cavernous internal carotid arteries bilaterally without a significant stenosis from the skull base through the ICA termini. The A1 and M1 segments are normal. The MCA bifurcations are within normal limits. Moderate 10 UA shin of MCA branch vessels is noted bilaterally, right greater than left. Moderate 10 UA shin of distal ACA branch vessels noted as well. Posterior circulation: The vertebral arteries are codominant. PICA origins are visualized and normal. Vertebrobasilar junction and basilar artery is normal. Both posterior cerebral arteries originate from basilar tip. Moderate attenuation of P3 branch vessels noted bilaterally. Venous sinuses: The dural sinuses are patent. The straight sinus deep cerebral veins are intact. Cortical veins are within normal limits. No significant vascular malformation is evident. Anatomic variants: None Review of the MIP images confirms the above findings IMPRESSION: 1. No emergent large vessel occlusion. 2. No significant vascular disease in the neck. 3. Moderate diffuse small vessel disease without a significant proximal stenosis, aneurysm, or branch vessel occlusion within the Circle of Willis. 4. Extensive osseous metastases with destruction of the posterior cortex at T3 and tumor extending into the spinal canal. 5. Left perihilar lung mass extends to the chest wall measuring over 6 cm. 6. Numerous pulmonary metastases. Electronically Signed   By: San Morelle M.D.   On: 06/14/2021 14:09   CT HEAD CODE STROKE WO CONTRAST  Result Date: 06/14/2021 CLINICAL DATA:  Code stroke. Neuro deficit, acute, stroke suspected. Personal history of lung cancer. Brain metastases. EXAM: CT HEAD WITHOUT CONTRAST TECHNIQUE: Contiguous axial images were obtained from the base of the skull through the vertex without intravenous  contrast. RADIATION DOSE REDUCTION: This exam was performed according to the departmental dose-optimization program which includes automated exposure control, adjustment of the mA and/or kV according to patient size and/or use of iterative reconstruction technique. COMPARISON:  MR head without and with contrast 05/07/2021. FINDINGS: Brain: The small metastases identified by MRI are not discerned on the CT scan. Minimal edema is noted adjacent to the left insular lesion. Mild diffuse white matter disease is again noted. The ventricles are of normal size. No significant extraaxial fluid collection is present. The brainstem and cerebellum are within normal limits. Vascular: Atherosclerotic calcifications are present within the cavernous internal carotid arteries. No hyperdense vessel  is present. Skull: Calvarium is intact. No focal lytic or blastic lesions are present. No significant extracranial soft tissue lesion is present. Sinuses/Orbits: Diffuse mucosal thickening is present throughout the ethmoid air cells, sphenoid sinuses and left greater than right maxillary sinus. Diffuse mucosal thickening is present in the frontal sinuses. Fluid levels are present in the sphenoid sinuses. The globes and orbits are within normal limits. ASPECTS [facility] Stroke Program Early CT Score) - Ganglionic level infarction (caudate, lentiform nuclei, internal capsule, insula, M1-M3 cortex): 7/7 - Supraganglionic infarction (M4-M6 cortex): 3/3 Total score (0-10 with 10 being normal): 10/10 IMPRESSION: 1. The small metastases identified by MRI are not discerned on the CT scan. 2. Minimal edema adjacent to the left insular lesion. 3. Otherwise stable appearance of diffuse white matter disease. 4. ASPECTS is 10/10. The above was relayed via text pager to Dr. Theda Sers on 06/14/2021 at 13:34 . Electronically Signed   By: San Morelle M.D.   On: 06/14/2021 13:35    EKG: NSR, rate of 88bpm.    Labs on Admission: I have personally  reviewed the available labs and imaging studies at the time of the admission.  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 CVA vs. Metastatic complication>brain MRI shows involving acute infarct see #2 -wife also concerned he may have taken too many pain pills causing the confusion. They are trying to get bottle at home to verify this. He is protecting airway and respiratory status is stable -will check metabolic labs -stroke w/u and neurology following with MRI brain -oncology consulted -no signs of infection, mildly elevated WBC. UA pending, CXR ordered. With possible CVA, place on aspiration precautions.    Acute CVA (cerebrovascular accident) [facility]) MRI brain shows evolving acute infarct in the MCA distribution. Additional punctate foci of diffusion restriction in the cerebellum and pons may reflect additional tiny infarcts vs. Signal abnormality related to metastatic disease.  -Neurochecks per protocol -Neurology consulted -echo  -lipid panel and A1C in AM -holding ASA with platelets 50  -Permissive hypertension first 24 hours <220/110 -N.p.o. until bedside swallow screen and more alert. Aspiration precautions.  -PT/ OT/ SLP consult -TOC consult, unsure if wife can take care of him he is unable to move left side.  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 ? If palliative radiation and they have messaged rad-onc He had had poor PO intake and increasing pain with increased pulmonary lesions and mets to bones. Discussed palliative care with wife and she states they have seen them outpatient. More goals of care will be  helpful  Nutrition/SW consult  Palliative care consult   Thrombocytopenia ([place]) Appears to be new finding that has progressively gotten worse over last 5 days Hold any VTE prophylaxis and ASA at this point  Oncology consulted   Essential hypertension Was hypotensive/soft on initial vitals, but improved. Will allow for permissive HTN in setting of possible CVA IV parameters PRN if needed   BPH (benign prostatic hyperplasia) Continue flomax when able to tolerate PO   Mild cognitive impairment Continue namenda when can safely swallow     Med rec no completed. Patient NPO and wife was going home to get meds and would return call with updated list.    Advance Care Planning:   Code Status: Full Code   Consults: neurology: Dr. Theda Sers, oncology: Dr. Julien Nordmann   DVT Prophylaxis: SCDs  Family Communication: wife at bedside: Garnette Gunner   Severity of Illness: The appropriate patient status for this patient is INPATIENT. Inpatient status is judged to be reasonable and necessary in order to provide the required intensity of service to ensure the patient's safety. The patient's presenting symptoms, physical exam findings, and initial radiographic and laboratory data in the context of their chronic comorbidities is felt to place them at high risk for further clinical deterioration. Furthermore, it is not anticipated that the  patient will be medically stable for discharge from the hospital within 2 midnights of admission.   * I certify that at the point of admission it is my clinical judgment that the patient will require inpatient hospital care spanning beyond 2 midnights from the point of admission due to high intensity of service, high risk for further deterioration and high frequency of surveillance required.*  Author: Orma Flaming, MD 06/14/2021 7:43 PM  For on call review [email or web address].

## 2021-06-14 NOTE — Code Documentation (Signed)
[name] a 61 yr old male with h/o metastatic lung cancer. He was in usual state of health today when wife went to work at [employer]. When transport arrived to take him to chemo appt, he was found down, mute and incontinent. [name] on no thinners.  Airway cleared, CBNG, Labs obtained at bridge. Pt to CT 2 at 1320. CTNC without hemorrhage per Dr. Theda Sers. CTA preformed. CTA shows no LVO per Dr. Theda Sers.  Pt taken to Trauma c where his workup will continue. He will need q 2 hr VS and NIHSS. Pt cleaned up, wife at bedside. Bedside handoff with ED RN Amber complete. Not candidate for thrombolysis as h/o intracranial neoplasm. Not candidate for neuro intervention as LVO negative.

## 2021-06-14 NOTE — ED Triage Notes (Signed)
Pt arrived from home with L sided deficit, wife left home at 845 when transport arrived to take pt to chemo pt found responsive but unable to move L side.  Pt met at bridge with all code stroke team members and taken to CT.  NRB in place, #16 est by EMS with 200CCNS given

## 2021-06-14 NOTE — Assessment & Plan Note (Addendum)
MRI brain shows evolving acute infarct in the MCA distribution. Additional punctate foci of diffusion restriction in the cerebellum and pons may reflect additional tiny infarcts vs. Signal abnormality related to metastatic disease.  -Neurochecks per protocol -Neurology consulted -echo  -lipid panel and A1C in AM -holding ASA with platelets 50  -Permissive hypertension first 24 hours <220/110 -N.p.o. until bedside swallow screen and more alert. Aspiration precautions.  -PT/ OT/ SLP consult -TOC consult, unsure if wife can take care of him he is unable to move left side.  -palliative care consult

## 2021-06-14 NOTE — Assessment & Plan Note (Signed)
Continue flomax when able to tolerate PO

## 2021-06-14 NOTE — ED Provider Notes (Signed)
[facility] EMERGENCY DEPARTMENT Provider Note   CSN: [id number] Arrival date & time: 06/14/21  1314  An emergency department physician performed an initial assessment on this suspected stroke patient at 1315.  History  Chief Complaint  Patient presents with   Code Stroke    Raymond [name] is a 61 y.o. male.  HPI  61 year old male with past medical history of lung cancer with mets to the brain presents to the emergency department with change in mental status as a code stroke.  Patient was reportedly found unresponsive this morning by the transport team for his chemo/radiation.  On arrival he is aphasic, eyes open but not tracking.  Level 5 caveat due to acuity and mental status change in regards to history.  Patient evaluated the EMS triage as a code stroke with the stroke neurology team at bedside.  No noted seizure activity/incontinence.  Home Medications Prior to Admission medications   Medication Sig Start Date End Date Taking? Authorizing Provider  acetaminophen (TYLENOL) 650 MG CR tablet Take 650 mg by mouth every 8 (eight) hours as needed for pain.    [provider]  Ensure Plus (ENSURE PLUS) LIQD Take 237 mLs by mouth 2 (two) times daily between meals. Chocolate    [provider]  fluticasone (FLONASE) 50 MCG/ACT nasal spray Place 1 spray into both nostrils daily. 03/16/21   Pickenpack-Cousar, Carlena Sax, NP  folic acid (FOLVITE) 1 MG tablet Take 1 tablet by mouth daily. 05/17/21 08/15/21  Mercy Riding, MD  gabapentin (NEURONTIN) 300 MG capsule Take 2 capsules (600 mg total) by mouth at bedtime. 05/25/21 06/25/21  Pickenpack-Cousar, Carlena Sax, NP  memantine (NAMENDA) 10 MG tablet Take 1 tablet (10 mg total) by mouth 2 (two) times daily. 01/26/21   [name], PA-C  mirtazapine (REMERON) 30 MG tablet Take 1 tablet (30 mg total) by mouth at bedtime. 04/13/21   [name], [name], PA-C  morphine (MS CONTIN) 15 MG 12 hr tablet Take  1 tablet by mouth every 12  hours. 05/25/21   Pickenpack-Cousar, Carlena Sax, NP  Multiple Vitamin (MULTIVITAMIN WITH MINERALS) TABS tablet Take 1 tablet by mouth daily. Patient taking differently: Take 1 tablet by mouth 2 (two) times daily. Ultimate Man 03/10/21   Mercy Riding, MD  ondansetron (ZOFRAN) 8 MG tablet Take 1 tablet (8 mg total) by mouth every 8 (eight) hours as needed for nausea or vomiting. 04/13/21   Pickenpack-Cousar, Carlena Sax, NP  Oxycodone HCl 10 MG TABS Take 1 tablet (10 mg total) by mouth every 4 (four) hours as needed for severe pain or breakthrough pain. 06/11/21   Pickenpack-Cousar, Carlena Sax, NP  prochlorperazine (COMPAZINE) 10 MG tablet Take 1 tablet (10 mg total) by mouth every 6 (six) hours as needed for nausea or vomiting. 02/25/21   Pickenpack-Cousar, Carlena Sax, NP  senna-docusate (SENNA S) 8.6-50 MG tablet Take 2 tablets by mouth daily. 04/13/21   Pickenpack-Cousar, Carlena Sax, NP  vitamin B-12 (CYANOCOBALAMIN) 1000 MCG tablet Take 1,000 mcg by mouth daily.    [provider]  Wheat Dextrin (BENEFIBER DRINK MIX) PACK Take 1 Package by mouth daily at 6 (six) AM.    [provider]      Allergies    Benadryl [diphenhydramine]    Review of Systems   Review of Systems  Unable to perform ROS: Mental status change    Physical Exam Updated Vital Signs BP 133/75   Pulse 99   Temp (!) 96.6  F (35.9 C) (Temporal)   Resp 18   Wt 105.8 kg   SpO2 95%   BMI 32.53 kg/m  Physical Exam Vitals and nursing note reviewed.  Constitutional:      Appearance: Normal appearance.  HENT:     Head: Normocephalic.     Mouth/Throat:     Mouth: Mucous membranes are moist.  Eyes:     Pupils: Pupils are equal, round, and reactive to light.  Cardiovascular:     Rate and Rhythm: Normal rate.  Pulmonary:     Effort: Pulmonary effort is normal. No respiratory distress.  Skin:    General: Skin is warm.  Neurological:     Mental Status: He is alert.     Comments: Aphasic,  alert, sometimes localizing to examiner but not specifically following commands, moving all 4 extremities     ED Results / Procedures / Treatments   Labs (all labs ordered are listed, but only abnormal results are displayed) Labs Reviewed  PROTIME-INR - Abnormal; Notable for the following components:      Result Value   Prothrombin Time 15.9 (*)    INR 1.3 (*)    All other components within normal limits  CBC - Abnormal; Notable for the following components:   WBC 11.6 (*)    RBC 3.34 (*)    Hemoglobin 9.6 (*)    HCT 31.1 (*)    Platelets 50 (*)    All other components within normal limits  DIFFERENTIAL - Abnormal; Notable for the following components:   Neutro Abs 11.0 (*)    Lymphs Abs 0.4 (*)    Monocytes Absolute 0.0 (*)    All other components within normal limits  COMPREHENSIVE METABOLIC PANEL - Abnormal; Notable for the following components:   Potassium 3.2 (*)    CO2 21 (*)    Glucose, Bld 155 (*)    Total Protein 6.1 (*)    Albumin 2.7 (*)    Alkaline Phosphatase 152 (*)    Total Bilirubin 1.4 (*)    All other components within normal limits  CBG MONITORING, ED - Abnormal; Notable for the following components:   Glucose-Capillary 155 (*)    All other components within normal limits  I-STAT CHEM 8, ED - Abnormal; Notable for the following components:   Potassium 3.1 (*)    Glucose, Bld 151 (*)    Calcium, Ion 1.14 (*)    TCO2 21 (*)    Hemoglobin 9.9 (*)    HCT 29.0 (*)    All other components within normal limits  APTT  CBG MONITORING, ED    EKG None  Radiology CT ANGIO HEAD NECK W WO CM (CODE STROKE)  Result Date: 06/14/2021 CLINICAL DATA:  Neuro deficit, acute, stroke suspected. Personal history of lung cancer. Brain metastases. EXAM: CT ANGIOGRAPHY HEAD AND NECK TECHNIQUE: Multidetector CT imaging of the head and neck was performed using the standard protocol during bolus administration of intravenous contrast. Multiplanar CT image reconstructions  and MIPs were obtained to evaluate the vascular anatomy. Carotid stenosis measurements (when applicable) are obtained utilizing NASCET criteria, using the distal internal carotid diameter as the denominator. RADIATION DOSE REDUCTION: This exam was performed according to the departmental dose-optimization program which includes automated exposure control, adjustment of the mA and/or kV according to patient size and/or use of iterative reconstruction technique. CONTRAST:  75 mL Omnipaque 350 COMPARISON:  CT head without contrast 06/14/2021. MR head without and with contrast 05/07/2021 FINDINGS: CTA NECK FINDINGS Aortic arch:  Common origin of left common carotid artery and innominate artery noted. No significant atherosclerotic disease or stenosis. Right carotid system: The right common carotid artery is within normal limits. Bifurcation is unremarkable. Cervical right ICA is within normal limits. Left carotid system: Left common carotid artery is within normal limits. Bifurcation is unremarkable. Cervical left ICA is within normal limits. Vertebral arteries: The vertebral arteries are codominant. Both vertebral arteries originate from the subclavian arteries without significant stenosis. No significant stenosis is present in either vertebral artery in the neck. Skeleton: Degenerative changes are present lower cervical spine. Multiple lucent lesions are present throughout the spine the posterior cortex is destroyed at T3 with tumor extending into the spinal canal. No pathologic fractures are present. Other neck: Soft tissues the neck are otherwise unremarkable. Salivary glands are within normal limits. Thyroid is normal. No significant adenopathy is present. No focal mucosal or submucosal lesions are present. Upper chest: Numerous pulmonary metastases are present. Left perihilar lung mass extends to the chest wall measuring over 6 cm. Extensive involvement of the left third rib is again noted. Review of the MIP images  confirms the above findings CTA HEAD FINDINGS Anterior circulation: Atherosclerotic changes are present the cavernous internal carotid arteries bilaterally without a significant stenosis from the skull base through the ICA termini. The A1 and M1 segments are normal. The MCA bifurcations are within normal limits. Moderate 10 UA shin of MCA branch vessels is noted bilaterally, right greater than left. Moderate 10 UA shin of distal ACA branch vessels noted as well. Posterior circulation: The vertebral arteries are codominant. PICA origins are visualized and normal. Vertebrobasilar junction and basilar artery is normal. Both posterior cerebral arteries originate from basilar tip. Moderate attenuation of P3 branch vessels noted bilaterally. Venous sinuses: The dural sinuses are patent. The straight sinus deep cerebral veins are intact. Cortical veins are within normal limits. No significant vascular malformation is evident. Anatomic variants: None Review of the MIP images confirms the above findings IMPRESSION: 1. No emergent large vessel occlusion. 2. No significant vascular disease in the neck. 3. Moderate diffuse small vessel disease without a significant proximal stenosis, aneurysm, or branch vessel occlusion within the Circle of Willis. 4. Extensive osseous metastases with destruction of the posterior cortex at T3 and tumor extending into the spinal canal. 5. Left perihilar lung mass extends to the chest wall measuring over 6 cm. 6. Numerous pulmonary metastases. Electronically Signed   By: San Morelle M.D.   On: 06/14/2021 14:09   CT HEAD CODE STROKE WO CONTRAST  Result Date: 06/14/2021 CLINICAL DATA:  Code stroke. Neuro deficit, acute, stroke suspected. Personal history of lung cancer. Brain metastases. EXAM: CT HEAD WITHOUT CONTRAST TECHNIQUE: Contiguous axial images were obtained from the base of the skull through the vertex without intravenous contrast. RADIATION DOSE REDUCTION: This exam was  performed according to the departmental dose-optimization program which includes automated exposure control, adjustment of the mA and/or kV according to patient size and/or use of iterative reconstruction technique. COMPARISON:  MR head without and with contrast 05/07/2021. FINDINGS: Brain: The small metastases identified by MRI are not discerned on the CT scan. Minimal edema is noted adjacent to the left insular lesion. Mild diffuse white matter disease is again noted. The ventricles are of normal size. No significant extraaxial fluid collection is present. The brainstem and cerebellum are within normal limits. Vascular: Atherosclerotic calcifications are present within the cavernous internal carotid arteries. No hyperdense vessel is present. Skull: Calvarium is intact. No focal lytic or  blastic lesions are present. No significant extracranial soft tissue lesion is present. Sinuses/Orbits: Diffuse mucosal thickening is present throughout the ethmoid air cells, sphenoid sinuses and left greater than right maxillary sinus. Diffuse mucosal thickening is present in the frontal sinuses. Fluid levels are present in the sphenoid sinuses. The globes and orbits are within normal limits. ASPECTS [facility] Stroke Program Early CT Score) - Ganglionic level infarction (caudate, lentiform nuclei, internal capsule, insula, M1-M3 cortex): 7/7 - Supraganglionic infarction (M4-M6 cortex): 3/3 Total score (0-10 with 10 being normal): 10/10 IMPRESSION: 1. The small metastases identified by MRI are not discerned on the CT scan. 2. Minimal edema adjacent to the left insular lesion. 3. Otherwise stable appearance of diffuse white matter disease. 4. ASPECTS is 10/10. The above was relayed via text pager to Dr. Theda Sers on 06/14/2021 at 13:34 . Electronically Signed   By: San Morelle M.D.   On: 06/14/2021 13:35    Procedures .Critical Care  Performed by: Lorelle Gibbs, DO Authorized by: Lorelle Gibbs, DO   Critical  care provider statement:    Critical care time (minutes):  30   Critical care time was exclusive of:  Separately billable procedures and treating other patients   Critical care was necessary to treat or prevent imminent or life-threatening deterioration of the following conditions:  CNS failure or compromise   Critical care was time spent personally by me on the following activities:  Development of treatment plan with patient or surrogate, discussions with consultants, evaluation of patient's response to treatment, examination of patient, ordering and review of laboratory studies, ordering and review of radiographic studies, ordering and performing treatments and interventions, pulse oximetry, re-evaluation of patient's condition and review of old charts   I assumed direction of critical care for this patient from another provider in my specialty: no     Care discussed with: admitting provider       Medications Ordered in ED Medications  sodium chloride flush (NS) 0.9 % injection 3 mL (has no administration in time range)  iohexol (OMNIPAQUE) 350 MG/ML injection 75 mL (75 mLs Intravenous Contrast Given 06/14/21 1422)    ED Course/ Medical Decision Making/ A&P                           Medical Decision Making Amount and/or Complexity of Data Reviewed Labs: ordered. Radiology: ordered.   61 year old male presents emergency department with mental status change.  Found unresponsive by transport team who takes him to chemo/radiation.  History of malignancy with metastatic disease to the brain.  Seen as a code stroke with the neuro team at bedside.  Patient is aphasic, initially not tracking the examiner with his eyes but they seem to cross midline, moving all 4 extremities.  Head CT shows no acute bleed, perfusion without any significant deficit.  Patient's neuro exam is unchanged, fingerstick was normal.  Metabolic work-up is reassuring.  We will plan for medical admit for further neurology  evaluation, MRI, possible EEG.  Patients evaluation and results requires admission for further treatment and care.  Spoke with hospitalist, reviewed patient's ED course and they accept admission.  Patient agrees with admission plan, offers no new complaints and is stable/unchanged at time of admit.        Final Clinical Impression(s) / ED Diagnoses Final diagnoses:  None    Rx / DC Orders ED Discharge Orders     None  Lorelle Gibbs, DO 06/14/21 1508

## 2021-06-14 NOTE — Consult Note (Signed)
Consultation Note Date: 06/14/2021   Patient Name: Raymond [name]  DOB: [birth year]  MRN: [id number]  Age / Sex: 61 y.o., male  PCP: Benito Mccreedy, MD Referring Physician: Orma Flaming, MD  Reason for Consultation: Establishing goals of care  HPI/Patient Profile: 61 y.o. 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    Patient was readmitted in May 2023 with left lower quadrant pain after diverticulitis admission.  PMT has been consulted to assist with goals of care conversation.  Clinical Assessment and Goals of Care:  I have reviewed medical records including EPIC notes, labs and imaging, and then met at the bedside with wife [name] to discuss diagnosis prognosis, [place], EOL wishes, disposition and options.  I introduced Palliative Medicine as specialized medical care for people living with serious illness. It focuses on providing relief from the symptoms and stress of a serious illness. The goal is to improve quality of life for both the patient and the family.  We discussed a brief life review of the patient and then focused on their current illness.   I attempted to elicit values and goals of care important to the patient.    Medical History Review and Understanding:  Patient's wife and I reviewed his medical conditions, including metastatic cancer and MRI results significant for acute right MCA stroke.  I clarified that he had 3 new lesions on his MRI in May, as she thought that they were all resolved from the effective cancer treatments.  We discussed the  impact of most recent hospitalization on his overall disease progression, as he was unable to continue with cancer treatments during this time.  Social History: Patient is unable to work due to his illness.  His wife works in home health care and helps takes care of him when she does not have clients.  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  Functional and Nutritional State: She confirms patient's poor p.o. intake, falls, overall decline.  Palliative Symptoms: Pain  Advance Directives: A detailed discussion regarding advanced directives was had.  HCPOA on file, his wife [name] is his Ambulance person.   Code Status: Concepts specific to code status, artifical feeding and hydration, and rehospitalization were considered and discussed.  [name] mentions in passing that she feels Petros would benefit from a feeding tube, I shared my concern that this will not be helpful in the long run with more harm than benefit as his cancer progresses.  Discussion: Patient's wife shares that she "has a lot on her plate" as her mother just died 3 weeks ago and she feels that now her husband is dying and "I am slowly losing him too."  She does not know much about the details of patient's interactions with outpatient palliative care at the cancer center. She wonders if this is something for people to just die with.  We discussed the difference between palliative care and hospice. Counseled patient's wife that all people make decisions based on their values and priorities, emphasizing that everyone reaches a point where treatments cause more burden than benefit.  For different people, this happens at different times over the course of their illnesses.  If Herbie Baltimore  is experiencing this, it would certainly warrant consideration of hospice with focus on relief from suffering rather than quantity of life.  The important thing is to have open and honest conversations  based on how Ranger's quality of life changes and how he would like to live.  She is unsure what quality of life he would find acceptable.   She does tells me that Gerber has been clear he wants to continue fighting.  We discussed how this acute stroke may impact Gomer's ability to tolerate further chemo and radiation, as well as her ability to care for him at home.  She understands there will need to be several conversations to ensure we are respecting his wishes and considering how this can affect his life.  He certainly would never want to go to a nursing home.  She is interested in learning more about how to expedite her application to the caps program through DSS so she can receive payments for caring for him at home.  She would not want to do this through her home health agency that she works for.   The difference between aggressive medical intervention and comfort care was considered in light of the patient's goals of care. Hospice and Palliative Care services outpatient were explained and offered.   Discussed the importance of continued conversation with family and the medical providers regarding overall plan of care and treatment options, ensuring decisions are within the context of the patient's values and GOCs.   Questions and concerns were addressed.  Hard Choices booklet left for review. The family was encouraged to call with questions or concerns.  PMT will continue to support holistically.   SUMMARY OF RECOMMENDATIONS   -Full code/full scope -Patient is followed by outpatient palliative care at [facility]; per notes and his wife's reports, he remains consistent in his goal to continue fighting and pursuing aggressive treatment -Psychosocial and emotional support provided Spiritual care consult appreciated -PMT will continue to follow for ongoing goals of care discussions  Prognosis:  Poor prognosis with acute stroke, lung cancer with metastasis, hospice appropriate  goals of care  Discharge Planning: To Be Determined      Primary Diagnoses: Present on Admission:  Acute encephalopathy  Mild cognitive impairment  Essential hypertension  Malignant neoplasm metastatic to brain (HCC)  Hypokalemia  BPH (benign prostatic hyperplasia)    Physical Exam Vitals and nursing note reviewed.   Unable to assess patient as he is in MRI   Vital Signs: BP 125/68   Pulse 89   Temp (!) 97.3 F (36.3 C) (Temporal)   Resp 19   Wt 105.8 kg   SpO2 99%   BMI 32.53 kg/m  Pain Scale: Faces       SpO2: SpO2: 99 % O2 Device:SpO2: 99 % O2 Flow Rate: .    Palliative Assessment/Data:     MDM: High   Analaya Hoey Johnnette Litter, PA-C  Palliative Medicine Team Team phone # [phone]  Thank you for allowing the Palliative Medicine Team to assist in the care of this patient. Please utilize secure chat with additional questions, if there is no response within 30 minutes please call the above phone number.  Palliative Medicine Team providers are available by phone from 7am to 7pm daily and can be reached through the team cell phone.  Should this patient require assistance outside of these hours, please call the patient's attending physician.

## 2021-06-14 NOTE — Assessment & Plan Note (Addendum)
Known mets to the brain and bone; however T3 lesion now extending into spinal cord off CTA imaging Have ordered MRI thoracic spine w/w out contrast Oncology has been consulted as well. ? If palliative radiation and they have messaged rad-onc He had had poor PO intake and increasing pain with increased pulmonary lesions and mets to bones. Discussed palliative care with wife and she states they have seen them outpatient. More goals of care will be helpful  Nutrition/SW consult  Palliative care consult

## 2021-06-14 NOTE — Consult Note (Addendum)
Neurology Consultation  Reason for Consult: Code Stroke Referring Physician: Horton  CC: unresponsive  History is obtained from:Chart, EMS  HPI: Raymond [name] is a 61 y.o. 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 He was supposed to go to chemo/radiation this morning at [facility] when he was found unresponsive by the transport team.    LKW: 0845 TNK given?: no, contraindicated with mets to the brain IR Thrombectomy? No, no LVO Modified Rankin Scale: 3-Moderate disability-requires help but walks WITHOUT assistance  ROS: Unable to obtain due to altered mental status.   Past Medical History:  Diagnosis Date   Arthritis    Baker's cyst    right   Diverticulitis    GERD (gastroesophageal reflux disease)    Heart attack ([place]) 01/04/2004   History of hiatal hernia    Hypertension    Malignant neoplasm of upper lobe of left lung ([place]) 09/24/2020   Pneumonia    as a child   Sleep apnea    no longer on Cpap    Family History  Problem Relation Age of Onset   Lung cancer Mother    Heart failure Sister      Social History:   reports that he has been smoking cigarettes. He has never used smokeless tobacco. He reports that he does not drink alcohol and does not use drugs.  Medications  Current Facility-Administered Medications:    sodium chloride flush (NS) 0.9 % injection 3 mL, 3 mL, Intravenous, Once, Horton, Kristie M, DO  Current Outpatient Medications:    acetaminophen (TYLENOL) 650 MG CR tablet, Take 650 mg by mouth every 8 (eight) hours as needed for pain., Disp: , Rfl:    Ensure Plus (ENSURE PLUS) LIQD, Take 237 mLs by mouth 2 (two) times daily between meals. Chocolate, Disp: , Rfl:    fluticasone (FLONASE) 50 MCG/ACT nasal spray, Place 1 spray into both nostrils daily., Disp: 16 g, Rfl: 2   folic acid (FOLVITE) 1 MG tablet, Take 1 tablet by mouth daily., Disp: 90 tablet, Rfl: 0   gabapentin  (NEURONTIN) 300 MG capsule, Take 2 capsules (600 mg total) by mouth at bedtime., Disp: 60 capsule, Rfl: 0   memantine (NAMENDA) 10 MG tablet, Take 1 tablet (10 mg total) by mouth 2 (two) times daily., Disp: 60 tablet, Rfl: 4   mirtazapine (REMERON) 30 MG tablet, Take 1 tablet (30 mg total) by mouth at bedtime., Disp: 30 tablet, Rfl: 0   morphine (MS CONTIN) 15 MG 12 hr tablet, Take 1 tablet by mouth every 12  hours., Disp: 60 tablet, Rfl: 0   Multiple Vitamin (MULTIVITAMIN WITH MINERALS) TABS tablet, Take 1 tablet by mouth daily. (Patient taking differently: Take 1 tablet by mouth 2 (two) times daily. Ultimate Man), Disp: , Rfl:    ondansetron (ZOFRAN) 8 MG tablet, Take 1 tablet (8 mg total) by mouth every 8 (eight) hours as needed for nausea or vomiting., Disp: 60 tablet, Rfl: 2   Oxycodone HCl 10 MG TABS, Take 1 tablet (10 mg total) by mouth every 4 (four) hours as needed for severe pain or breakthrough pain., Disp: 15 tablet, Rfl: 0   prochlorperazine (COMPAZINE) 10 MG tablet, Take 1 tablet (10 mg total) by mouth every 6 (six) hours as needed for nausea or vomiting., Disp: 90 tablet, Rfl: 2   senna-docusate (SENNA S) 8.6-50 MG tablet, Take 2 tablets by mouth daily., Disp: 60 tablet, Rfl: 2  vitamin B-12 (CYANOCOBALAMIN) 1000 MCG tablet, Take 1,000 mcg by mouth daily., Disp: , Rfl:    Wheat Dextrin (BENEFIBER DRINK MIX) PACK, Take 1 Package by mouth daily at 6 (six) AM., Disp: , Rfl:    Exam: Current vital signs: There were no vitals taken for this visit. Vital signs in last 24 hours:    GENERAL: Awake, alert, in no acute distress Psych: Affect appropriate for situation, patient is calm and cooperative with examination Head: Normocephalic and atraumatic, without obvious abnormality EENT: Normal conjunctivae, dry mucous membranes, no OP obstruction LUNGS: Normal respiratory effort. Non-labored breathing on room air CV: Regular rate and rhythm on telemetry ABDOMEN: Soft, non-tender,  non-distended Extremities: warm, well perfused, without obvious deformity  NEURO:  Mental Status: Eyes open, not tracking examiner to the left.  Speech/Language: speech is absent.   Naming, repetition, fluency, and comprehension intact without aphasia  No neglect is noted Cranial Nerves:  II: PERRL 3 mm/brisk. visual fields full.  III, IV, VI: Left gaze preference V: Sensation is intact to light touch and symmetrical to face. Blinks to threat. Moves jaw back and forth.  VII: Face is symmetric resting and smiling. Able to puff cheeks and raise eyebrows.  VIII: Hearing intact to voice IX, X: Palate elevation is symmetric. Phonation normal.  XI: Normal sternocleidomastoid and trapezius muscle strength XII: Tongue protrudes midline without fasciculations.   Motor: 5/5 strength is all muscle groups.  Tone is normal. Bulk is normal.  Sensation: Intact to light touch bilaterally in all four extremities. No extinction to DSS present.  Coordination: FTN intact bilaterally. HKS intact bilaterally. No pronator drift. Alternating hand movements.  DTRs: 2+ throughout.  Gait: Deferred  NIHSS: 1a Level of Conscious.: 0 1b LOC Questions: 2 1c LOC Commands: 2 2 Best Gaze: 1 3 Visual: 1 4 Facial Palsy: 0 5a Motor Arm - left: 3 5b Motor Arm - Right: 2  6a Motor Leg - Left: 3 6b Motor Leg - Right: 2 7 Limb Ataxia: 0 8 Sensory: 0 9 Best Language: 3 10 Dysarthria: 2 11 Extinct. and Inatten.: 1 TOTAL: 23   Labs I have reviewed labs in epic and the results pertinent to this consultation are:    CBC    Component Value Date/Time   WBC 5.1 06/09/2021 0900   WBC 4.5 06/09/2021 0738   RBC 3.38 (L) 06/09/2021 0900   HGB 9.6 (L) 06/09/2021 0900   HGB 9.5 (L) 06/09/2021 0738   HCT 30.4 (L) 06/09/2021 0900   PLT 109 (L) 06/09/2021 0900   PLT 96 (L) 06/09/2021 0738   MCV 89.9 06/09/2021 0900   MCH 28.4 06/09/2021 0900   MCHC 31.6 06/09/2021 0900   RDW 14.3 06/09/2021 0900   LYMPHSABS 0.9  06/09/2021 0900   MONOABS 0.1 06/09/2021 0900   EOSABS 0.2 06/09/2021 0900   BASOSABS 0.0 06/09/2021 0900    CMP     Component Value Date/Time   NA 142 06/09/2021 0900   K 3.4 (L) 06/09/2021 0900   CL 107 06/09/2021 0900   CO2 25 06/09/2021 0900   GLUCOSE 106 (H) 06/09/2021 0900   BUN 19 06/09/2021 0900   CREATININE 0.75 06/09/2021 0900   CREATININE 0.67 06/09/2021 0738   CALCIUM 9.6 06/09/2021 0900   PROT 6.4 (L) 06/09/2021 0738   ALBUMIN 3.4 (L) 06/09/2021 0738   ALBUMIN 4.0 05/20/2019 1951   AST 32 06/09/2021 0738   ALT 15 06/09/2021 0738   ALKPHOS 153 (H) 06/09/2021 0738   BILITOT  0.7 06/09/2021 0738   GFRNONAA >60 06/09/2021 0900   GFRNONAA >60 06/09/2021 0738   GFRAA >60 05/25/2019 0427      Imaging I have reviewed the images obtained:  CT-scan of the brain- Minimal edema adjacent to the left insular lesion  CTA head and neck-  There appeared to be occlusion at right M1 bifurcation however resolution was suboptimal discussed with neruoradiology and felt to be significant stenosis without complete vessel occlusion. Extensive osseous metastases with destruction of the posterior cortex at T3 and tumor extending into the spinal canal. Left perihilar lung mass extends to the chest wall measuring over 6 cm. Numerous pulmonary metastases.  MRI examination of the brain- Diffusion restriction in the right insula and parietal lobe cortex consistent with evolving acute infarct in the MCA distribution.  Assessment: Stroke vs complication from metastatic cancer  Impression:61 year old male with a PMH of HTN, lung cancer with mets to the brain found unresponsive prior to his scheduled chemo and radiation.   Recommendations: - HgbA1c, fasting lipid panel - MRI, MRA  of the brain without and with contrast - Frequent neuro checks - Echocardiogram - Carotid dopplers - Prophylactic therapy-Antiplatelet med: Aspirin - dose 325mg  PO or 300mg  PR - Risk factor modification -  Telemetry monitoring - PT consult, OT consult, Speech consult - Stroke team to follow - Discuss with oncolology  Patient seen and examined by NP/APP with MD. MD to update note as needed.   Janine Ores, DNP, FNP-BC [facility] Pager: [phone]  Attending Attestation:  Patient seen, examined, labs,vitals and notes reviewed. Discussed plan with Charlean Merl, NP and agree with assessment and plan as documented above. I have independently reviewed the chart, obtained history, review of systems and examined the patient.   This patient is critically ill and at significant risk of neurological worsening, death and care requires constant monitoring of vital signs, hemodynamics,respiratory and cardiac monitoring, neurological assessment, discussion with family, other specialists and medical decision making of high complexity. I spent 75 minutes of neurocritical care time  in the care of  this patient. This was time spent independent of any time provided by nurse practitioner or PA.   Electronically signed by:  Lynnae Sandhoff, MD Page: [phone] 06/14/2021, 7:13 PM

## 2021-06-14 NOTE — ED Notes (Signed)
Pt to MRI via stretcher on CCM

## 2021-06-14 NOTE — Assessment & Plan Note (Addendum)
61 year old with known stage IV lung cancer with mets to brain/bones found unresponsive this AM and unable to move his left side -admit to progressive -? CVA vs. Metastatic complication>brain MRI shows involving acute infarct see #2 -wife also concerned he may have taken too many pain pills causing the confusion. They are trying to get bottle at home to verify this. He is protecting airway and respiratory status is stable -will check metabolic labs -stroke w/u and neurology following with MRI brain -oncology consulted -no signs of infection, mildly elevated WBC. UA pending, CXR ordered. With possible CVA, place on aspiration precautions.

## 2021-06-14 NOTE — Assessment & Plan Note (Signed)
Continue namenda when can safely swallow

## 2021-06-14 NOTE — Assessment & Plan Note (Signed)
Likely secondary to poor PO intake Check magnesium Replete by IV  Nutrition consult

## 2021-06-14 NOTE — Assessment & Plan Note (Signed)
Appears to be new finding that has progressively gotten worse over last 5 days Hold any VTE prophylaxis and ASA at this point  Oncology consulted

## 2021-06-15 ENCOUNTER — Ambulatory Visit: Payer: Medicaid Other

## 2021-06-15 ENCOUNTER — Other Ambulatory Visit: Payer: 59

## 2021-06-15 ENCOUNTER — Inpatient Hospital Stay: Payer: Medicaid Other

## 2021-06-15 ENCOUNTER — Ambulatory Visit: Payer: 59 | Admitting: Internal Medicine

## 2021-06-15 ENCOUNTER — Inpatient Hospital Stay (HOSPITAL_COMMUNITY): Payer: Medicaid Other

## 2021-06-15 ENCOUNTER — Ambulatory Visit: Payer: 59

## 2021-06-15 ENCOUNTER — Encounter: Payer: Medicaid Other | Admitting: Dietician

## 2021-06-15 DIAGNOSIS — R4182 Altered mental status, unspecified: Secondary | ICD-10-CM

## 2021-06-15 DIAGNOSIS — G9340 Encephalopathy, unspecified: Secondary | ICD-10-CM

## 2021-06-15 DIAGNOSIS — N400 Enlarged prostate without lower urinary tract symptoms: Secondary | ICD-10-CM | POA: Diagnosis not present

## 2021-06-15 DIAGNOSIS — C7931 Secondary malignant neoplasm of brain: Secondary | ICD-10-CM | POA: Diagnosis not present

## 2021-06-15 DIAGNOSIS — I6389 Other cerebral infarction: Secondary | ICD-10-CM

## 2021-06-15 LAB — ECHOCARDIOGRAM COMPLETE
AR max vel: 2.84 cm2
AV Area VTI: 3.08 cm2
AV Area mean vel: 2.96 cm2
AV Mean grad: 6 mmHg
AV Peak grad: 11.7 mmHg
Ao pk vel: 1.71 m/s
Area-P 1/2: 3.45 cm2
Height: 71 in
S' Lateral: 2.9 cm
Weight: 3731.95 oz

## 2021-06-15 LAB — LIPID PANEL
Cholesterol: 163 mg/dL (ref 0–200)
HDL: 34 mg/dL — ABNORMAL LOW (ref >40)
LDL Cholesterol: 110 mg/dL — ABNORMAL HIGH (ref 0–99)
Total CHOL/HDL Ratio: 4.8 RATIO
Triglycerides: 96 mg/dL (ref <150)
VLDL: 19 mg/dL (ref 0–40)

## 2021-06-15 LAB — BASIC METABOLIC PANEL
Anion gap: 14 (ref 5–15)
BUN: 16 mg/dL (ref 6–20)
CO2: 22 mmol/L (ref 22–32)
Calcium: 9.5 mg/dL (ref 8.9–10.3)
Chloride: 105 mmol/L (ref 98–111)
Creatinine, Ser: 0.77 mg/dL (ref 0.61–1.24)
GFR, Estimated: >60 mL/min (ref >60)
Glucose, Bld: 93 mg/dL (ref 70–99)
Potassium: 4 mmol/L (ref 3.5–5.1)
Sodium: 141 mmol/L (ref 135–145)

## 2021-06-15 MED ORDER — OXYCODONE HCL 20 MG/ML PO CONC
5.0000 mg | ORAL | Status: DC | PRN
  Filled 2021-06-15: qty 0.3

## 2021-06-15 MED ORDER — LORAZEPAM 2 MG/ML IJ SOLN: 1.0000 mg | INTRAMUSCULAR | Status: DC | PRN

## 2021-06-15 MED ORDER — GLYCOPYRROLATE 0.2 MG/ML IJ SOLN: 0.2000 mg | INTRAMUSCULAR | Status: DC | PRN

## 2021-06-15 MED ORDER — ONDANSETRON HCL 4 MG/2ML IJ SOLN: 4.0000 mg | Freq: Four times a day (QID) | INTRAMUSCULAR | Status: DC | PRN

## 2021-06-15 MED ORDER — ASPIRIN 81 MG PO CHEW: 81.0000 mg | CHEWABLE_TABLET | Freq: Every day | ORAL | Status: DC

## 2021-06-15 MED ORDER — POLYVINYL ALCOHOL 1.4 % OP SOLN
1.0000 [drp] | Freq: Four times a day (QID) | OPHTHALMIC | Status: DC | PRN
  Filled 2021-06-15: qty 15

## 2021-06-15 MED ORDER — HALOPERIDOL LACTATE 2 MG/ML PO CONC
0.5000 mg | ORAL | Status: DC | PRN
  Filled 2021-06-15: qty 5

## 2021-06-15 MED ORDER — HALOPERIDOL LACTATE 5 MG/ML IJ SOLN: 0.5000 mg | INTRAMUSCULAR | Status: DC | PRN

## 2021-06-15 MED ORDER — ASPIRIN 300 MG RE SUPP: 300.0000 mg | Freq: Every day | RECTAL | Status: DC

## 2021-06-15 MED ORDER — BISACODYL 10 MG RE SUPP: 10.0000 mg | Freq: Every day | RECTAL | Status: DC | PRN

## 2021-06-15 MED ORDER — LORAZEPAM 2 MG/ML PO CONC
1.0000 mg | ORAL | Status: DC | PRN
  Filled 2021-06-15: qty 0.5

## 2021-06-15 MED ORDER — ONDANSETRON 4 MG PO TBDP: 4.0000 mg | ORAL_TABLET | Freq: Four times a day (QID) | ORAL | Status: DC | PRN

## 2021-06-15 MED ORDER — GLYCOPYRROLATE 1 MG PO TABS
1.0000 mg | ORAL_TABLET | ORAL | Status: DC | PRN
  Filled 2021-06-15: qty 1

## 2021-06-15 MED ORDER — HYDROMORPHONE HCL 1 MG/ML IJ SOLN: 1.0000 mg | INTRAMUSCULAR | Status: DC | PRN

## 2021-06-15 MED ORDER — BIOTENE DRY MOUTH MT LIQD: 15.0000 mL | OROMUCOSAL | Status: DC | PRN

## 2021-06-15 NOTE — Evaluation (Signed)
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  06/14/2021   Thrombocytopenia ([place]) 06/14/2021   RUQ pain 06/09/2021   Shortness of breath 06/09/2021   Abdominal wall cellulitis 04/28/2021   BPH (benign prostatic hyperplasia) 04/28/2021   Abdominal pain 04/28/2021   Oral thrush 04/13/2021   Decreased appetite 04/13/2021   Malignant neoplasm metastatic to brain ([place]) 03/09/2021   At risk for sleep apnea 03/08/2021   Blister of skin 03/07/2021   Bandemia 03/06/2021   Normocytic anemia 03/05/2021   Mild cognitive impairment 03/04/2021   Sigmoid diverticulitis with abscess 03/03/2021   Abscess of sigmoid colon due to diverticulitis 03/03/2021   Anxiety and depression 03/03/2021   Headaches due to old head injury 03/01/2021   Hearing loss 03/01/2021   Lower urinary tract symptoms (LUTS) 01/27/2021   RLS (restless legs syndrome) 01/27/2021   Colitis presumed infectious 01/25/2021   Hypokalemia 01/25/2021   Sepsis ([place]) 01/24/2021   Essential hypertension 11/02/2020   Adenocarcinoma of left lung, stage 4 ([place]) 10/05/2020   Malignant neoplasm of upper lobe of left lung ([place]) 09/24/2020   Goals of care, counseling/discussion 09/24/2020   Mass of left lung 09/14/2020   Hx of diverticulitis of colon 05/21/2019   Paraplegia, incomplete ([place]) 05/20/2019   Baker cyst 02/28/2015   Chest pain, exertional 02/28/2015   Baker's cyst 02/28/2015   Abnormal EKG 02/28/2015   MICROSCOPIC HEMATURIA 10/17/2007   ABDOMINAL PAIN 10/17/2007   HEMATURIA, HX OF 10/17/2007   PSYCHOLOGICAL STRESS 05/31/2007   Obesity, unspecified 04/19/2007    TOBACCO ABUSE 04/19/2007   RHINITIS, ALLERGIC NOS 04/19/2006   FATIGUE 03/09/2006   WEIGHT GAIN 03/09/2006   HEADACHE 03/09/2006   Anxiety 03/02/2006   Past Medical History:  Past Medical History:  Diagnosis Date   Arthritis    Baker's cyst    right   Diverticulitis    GERD (gastroesophageal reflux disease)    Heart attack ([place]) 01/04/2004   History of hiatal hernia    Hypertension    Malignant neoplasm of upper lobe of left lung ([place]) 09/24/2020   Pneumonia    as a child   Sleep apnea    no longer on Cpap   Past Surgical History:  Past Surgical History:  Procedure Laterality Date   BIOPSY  01/26/2021   Procedure: BIOPSY;  Surgeon: Ronnette Juniper, MD;  Location: [facility];  Service: Gastroenterology;;   BRONCHIAL BIOPSY  09/14/2020   Procedure: BRONCHIAL BIOPSIES;  Surgeon: Collene Gobble, MD;  Location: [facility] ENDOSCOPY;  Service: Cardiopulmonary;;   BRONCHIAL BRUSHINGS  09/14/2020   Procedure: BRONCHIAL BRUSHINGS;  Surgeon: Collene Gobble, MD;  Location: [place];  Service: Cardiopulmonary;;   BRONCHIAL NEEDLE ASPIRATION BIOPSY  09/14/2020   Procedure: BRONCHIAL NEEDLE ASPIRATION BIOPSIES;  Surgeon: Collene Gobble, MD;  Location: [facility];  Service: Cardiopulmonary;;   CARDIAC CATHETERIZATION     CARPAL TUNNEL RELEASE     right   COLONOSCOPY WITH PROPOFOL N/A 01/26/2021   Procedure: COLONOSCOPY WITH PROPOFOL;  Surgeon: Ronnette Juniper, MD;  Location: [facility];  Service: Gastroenterology;  Laterality: N/A;   FIDUCIAL MARKER PLACEMENT  09/14/2020   Procedure: FIDUCIAL MARKER PLACEMENT;  Surgeon: Collene Gobble,  MD;  Location: [facility];  Service: Cardiopulmonary;;   HAND SURGERY     left   IR CATHETER TUBE CHANGE  03/19/2021   IR CATHETER TUBE CHANGE  04/30/2021   IR FLUORO RM 30-60 MIN  03/18/2021   IR RADIOLOGIST EVAL & MGMT  04/07/2021   KNEE SURGERY     left   VIDEO BRONCHOSCOPY N/A 09/14/2020   Procedure: ROBOTIC VIDEO BRONCHOSCOPY WITH FLUORO;  Surgeon: Collene Gobble, MD;  Location: [place];  Service: Cardiopulmonary;  Laterality: N/A;   VIDEO BRONCHOSCOPY WITH ENDOBRONCHIAL ULTRASOUND N/A 09/14/2020   Procedure: VIDEO BRONCHOSCOPY WITH ENDOBRONCHIAL ULTRASOUND;  Surgeon: Collene Gobble, MD;  Location: [place];  Service: Cardiopulmonary;  Laterality: N/A;   VIDEO BRONCHOSCOPY WITH RADIAL ENDOBRONCHIAL ULTRASOUND  09/14/2020   Procedure: VIDEO BRONCHOSCOPY WITH RADIAL ENDOBRONCHIAL ULTRASOUND;  Surgeon: Collene Gobble, MD;  Location: [facility] ENDOSCOPY;  Service: Cardiopulmonary;;   HPI:  [name] a 61 yo male with 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 MRI positive for R MCA stroke. Also questionable tiny infarcts vs metastatic disease in the cerebellum and pons as well as known metastatic lesions. PMH also includes: GERD, HH, heart attack, sleep apnea, HTN, depression, recent hospitalization in March 2023 for diverticulitis with abscess s/p drain placement   Assessment / Plan / Recommendation Clinical Impression  [name] oriented to person only and has quite variable alertness throughout this evaluation. Even when alert, he does not respond consistently to one-step commands, and his responses to simple questions are somewhat perseverative. He has trouble with very simple verbal problem solving. His speech is soft and imprecise, likely impacted by L sided facial weakness. Unclear exactly what his baseline may be given h/o mets to the brain with whole brain XRT, although from what I can gather in the chart so far, this was definitely an acute change. Recommend ongoing SLP f/u acutely and upon discharge.    SLP Assessment  SLP Recommendation/Assessment: Patient needs continued Speech [place] [facility] SLP Visit Diagnosis: Cognitive communication deficit (R41.841)    Recommendations for follow up therapy are one  component of a multi-disciplinary discharge planning process, led by the attending physician.  Recommendations may be updated based on patient status, additional functional criteria and insurance authorization.    Follow Up Recommendations  Acute inpatient rehab (3hours/day) (pending availability of family support)    Assistance Recommended at Discharge  Frequent or constant Supervision/Assistance  Functional Status Assessment Patient has had a recent decline in their functional status and demonstrates the ability to make significant improvements in function in a reasonable and predictable amount of time.  Frequency and Duration min 2x/week  2 weeks      SLP Evaluation Cognition  Overall Cognitive Status: No family/caregiver present to determine baseline cognitive functioning Arousal/Alertness: Lethargic Orientation Level: Oriented to person;Disoriented to place;Disoriented to time;Disoriented to situation Attention: Focused Focused Attention: Impaired Focused Attention Impairment: Verbal basic;Functional basic Awareness: Impaired Awareness Impairment: Emergent impairment Problem Solving: Impaired Problem Solving Impairment: Verbal basic Behaviors: Perseveration Safety/Judgment: Impaired       Comprehension  Auditory Comprehension Overall Auditory Comprehension: Impaired Commands: Impaired One Step Basic Commands: 25-49% accurate Interfering Components: Other (comment) (lethargy)    Expression Expression Primary Mode of Expression: Verbal Verbal Expression Overall Verbal Expression:  (needs further assessment - some perseverative answers)   Oral / Motor  Oral Motor/Sensory  Function Overall Oral Motor/Sensory Function: Other (comment) (inconsistently following commands to assess but definitely with L facial droop) Motor Speech Overall Motor Speech: Impaired Respiration: Within functional limits Phonation: Low vocal intensity Resonance: Within functional  limits Articulation: Impaired Level of Impairment: Word Intelligibility: Intelligibility reduced Word: 75-100% accurate Phrase: 50-74% accurate Effective Techniques: Increased vocal intensity            .Osie Bond., M.A. [place] Office [phone]  Secure chat preferred  06/15/2021, 10:05 AM

## 2021-06-15 NOTE — Progress Notes (Signed)
Daily Progress Note   Patient Name: Raymond [name]       Date: 06/15/2021 DOB: 07-25-1960  Age: 61 y.o. MRN#: [id number] Attending Physician: Jonetta Osgood, MD Primary Care Physician: Benito Mccreedy, MD Admit Date: 06/14/2021  Reason for Consultation/Follow-up: Establishing goals of care  Subjective: Medical records reviewed including progress notes, labs, imaging.  Attempted to visit patient, was receiving personal care and no family was present.  I then called patient's wife [name] to continue our conversation and provide updates, palliative support.  We reviewed that unfortunately he has failed his swallow evaluation, remains disoriented and lethargic per medical records.  I shared that his attending has discussed with patient's oncologist and patient is no longer a candidate for further chemotherapy.  All specialties involved in Raymond [name]'s care agree that hospice would be appropriate at this time.  Counseled [name] that his cancer will continue to progress with worsening symptoms even with other medical interventions continuing.  Given his dismal prognosis and worsening quality of life, I recommended hospice as well.  We also discussed inpatient comfort care in detail.  We discussed home hospice versus residential hospice and I shared that he will likely need 24/7 support provided by a facility that focuses on his comfort and dignity.  [name] inquires as to whether I found out more about the caps program and if she could serve as his caregiver at home with hospice.  I shared with her that I discussed with TOC and researched this program today.  This likely would not be a viable option, as the wait for approval is extremely long and he would likely die before this could be arranged.  We  discussed the difference between home health care and hospice care and she understands that while she is a home health caregiver, he would likely need specialized care from a hospice caregiver.  [name] is agreeable to referral to residential hospice, prefers [facility].  We discussed CODE STATUS given lack of options for further treatment and shift to focus on comfort. Recommended consideration of DNR status, understanding evidenced-based poor outcomes in similar hospitalized patients, as the cause of the arrest is likely associated with chronic/terminal disease rather than a reversible acute cardio-pulmonary event.  This is hard for her to process, as he has clearly stated he wanted to be resuscitated.  We discussed his living will and that he did indicate he would not want life-prolonging measures if he had "substantial loss of his ability to think" whether from dementia, stroke, or any other condition.  She is agreeable for DNR at this time.  Questions and concerns addressed.  PMT will continue to support holistically.  Length of Stay: 1   Physical Exam Vitals and nursing note reviewed.     Patient receiving personal care, did not assess.  Vital Signs: BP (!) 152/74 (BP Location: Right Arm)   Pulse 96   Temp 98.3 F (36.8 C) (Oral)   Resp 20   Ht 5\' 11"  (1.803 m)   Wt 105.8 kg   SpO2 94%   BMI 32.53 kg/m  SpO2: SpO2: 94 % O2 Device: O2 Device: Room Air O2 Flow Rate:        Palliative Assessment/Data: 10%       Palliative Care Assessment & Plan   Patient Profile: 61 y.o. 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      Patient  was readmitted in May 2023 with left lower quadrant pain after diverticulitis admission.  PMT has been consulted to assist with goals of care conversation.  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 No escalation.   Patient's wife is agreeable to referral to residential hospice, prefers [facility].  TOC assistance is appreciated Psychosocial emotional support provided PMT will continue to follow   Prognosis:  < 2 weeks  Discharge Planning: Hospice facility   MD[place]         [place], PA-C  Palliative Medicine Team Team phone # [phone]  Thank you for allowing the Palliative Medicine Team to assist in the care of this patient. Please utilize secure chat with additional questions, if there is no response within 30 minutes please call the above phone number.  Palliative Medicine Team providers are available by phone from 7am to 7pm daily and can be reached through the team cell phone.  Should this patient require assistance outside of these hours, please call the patient's attending physician.

## 2021-06-15 NOTE — Evaluation (Signed)
Clinical/Bedside Swallow Evaluation Patient Details  Name: Raymond [name] MRN: [id number] Date of Birth: 1960/02/11  Today's Date: 06/15/2021 Time: SLP Start Time (ACUTE ONLY): 0933 SLP Stop Time (ACUTE ONLY): 6440 SLP Time Calculation (min) (ACUTE ONLY): 9 min  Past Medical History:  Past Medical History:  Diagnosis Date   Arthritis    Baker's cyst    right   Diverticulitis    GERD (gastroesophageal reflux disease)    Heart attack ([place]) 01/04/2004   History of hiatal hernia    Hypertension    Malignant neoplasm of upper lobe of left lung ([place]) 09/24/2020   Pneumonia    as a child   Sleep apnea    no longer on Cpap   Past Surgical History:  Past Surgical History:  Procedure Laterality Date   BIOPSY  01/26/2021   Procedure: BIOPSY;  Surgeon: Ronnette Juniper, MD;  Location: [facility];  Service: Gastroenterology;;   BRONCHIAL BIOPSY  09/14/2020   Procedure: BRONCHIAL BIOPSIES;  Surgeon: Collene Gobble, MD;  Location: [facility];  Service: Cardiopulmonary;;   BRONCHIAL BRUSHINGS  09/14/2020   Procedure: BRONCHIAL BRUSHINGS;  Surgeon: Collene Gobble, MD;  Location: [place];  Service: Cardiopulmonary;;   BRONCHIAL NEEDLE ASPIRATION BIOPSY  09/14/2020   Procedure: BRONCHIAL NEEDLE ASPIRATION BIOPSIES;  Surgeon: Collene Gobble, MD;  Location: [place];  Service: Cardiopulmonary;;   CARDIAC CATHETERIZATION     CARPAL TUNNEL RELEASE     right   COLONOSCOPY WITH PROPOFOL N/A 01/26/2021   Procedure: COLONOSCOPY WITH PROPOFOL;  Surgeon: Ronnette Juniper, MD;  Location: [facility];  Service: Gastroenterology;  Laterality: N/A;   FIDUCIAL MARKER PLACEMENT  09/14/2020   Procedure: FIDUCIAL MARKER PLACEMENT;  Surgeon: Collene Gobble, MD;  Location: [facility];  Service: Cardiopulmonary;;   HAND SURGERY     left   IR CATHETER TUBE CHANGE  03/19/2021   IR CATHETER TUBE CHANGE  04/30/2021   IR FLUORO RM 30-60 MIN  03/18/2021   IR RADIOLOGIST EVAL & MGMT  04/07/2021   KNEE SURGERY      left   VIDEO BRONCHOSCOPY N/A 09/14/2020   Procedure: ROBOTIC VIDEO BRONCHOSCOPY WITH FLUORO;  Surgeon: Collene Gobble, MD;  Location: [place];  Service: Cardiopulmonary;  Laterality: N/A;   VIDEO BRONCHOSCOPY WITH ENDOBRONCHIAL ULTRASOUND N/A 09/14/2020   Procedure: VIDEO BRONCHOSCOPY WITH ENDOBRONCHIAL ULTRASOUND;  Surgeon: Collene Gobble, MD;  Location: [place];  Service: Cardiopulmonary;  Laterality: N/A;   VIDEO BRONCHOSCOPY WITH RADIAL ENDOBRONCHIAL ULTRASOUND  09/14/2020   Procedure: VIDEO BRONCHOSCOPY WITH RADIAL ENDOBRONCHIAL ULTRASOUND;  Surgeon: Collene Gobble, MD;  Location: [facility];  Service: Cardiopulmonary;;   HPI:  Raymond [name] with 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 MRI positive for R MCA stroke. Also questionable tiny infarcts vs metastatic disease in the cerebellum and pons as well as known metastatic lesions. PMH also includes: GERD, HH, heart attack, sleep apnea, HTN, depression, recent hospitalization in March 2023 for diverticulitis with abscess s/p drain placement    Assessment / Plan / Recommendation  Clinical Impression  Raymond [name]'s swallowing is largely impacted by mentation at this time. He will awaken and respond to questions but then quickly fall asleep. He can stay alert to swallow a few single spoonfuls of water, but consistently falls asleep in the amount of time it takes to chew up a  piece of ice. When he falls asleep with the ice in his mouth he ends up coughing, concerning for spillage toward the pharynx/larynx as it melts. Hopeful that he will be able to progress to a PO diet as his level of alertness improves, although he does also have a L facial droop and overall impact on swallow is fairly limited at this time. Would remain NPO for now with SLP f/u as mentation allows. SLP Visit Diagnosis: Dysphagia, unspecified  (R13.10)    Aspiration Risk  Moderate aspiration risk;Risk for inadequate nutrition/hydration    Diet Recommendation NPO;Other (Comment) (could offer a few spoonfuls of water after oral care if fully alert and asking)   Liquid Administration via: Spoon Medication Administration: Via alternative means    Other  Recommendations Oral Care Recommendations: Oral care QID    Recommendations for follow up therapy are one component of a multi-disciplinary discharge planning process, led by the attending physician.  Recommendations may be updated based on patient status, additional functional criteria and insurance authorization.  Follow up Recommendations  (tba)      Assistance Recommended at Discharge Frequent or constant Supervision/Assistance  Functional Status Assessment Patient has had a recent decline in their functional status and demonstrates the ability to make significant improvements in function in a reasonable and predictable amount of time.  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 MRI positive for R MCA stroke. Also questionable tiny infarcts vs metastatic disease in the cerebellum and pons as well as known metastatic lesions. PMH also includes: GERD, HH, heart attack, sleep apnea, HTN, depression, recent hospitalization in March 2023 for diverticulitis with abscess s/p drain placement Type of Study: Bedside Swallow Evaluation Previous Swallow Assessment: none in chart Diet Prior to this Study: NPO Temperature Spikes Noted: No Respiratory Status: Room air History of Recent Intubation: No Behavior/Cognition: Alert;Cooperative;Pleasant  mood;Confused;Requires cueing Oral Cavity Assessment: Within Functional Limits Oral Care Completed by SLP: No Oral Cavity - Dentition: Adequate natural dentition (missing a front tooth) Self-Feeding Abilities: Total assist;Needs assist Patient Positioning: Upright in bed Baseline Vocal Quality: Low vocal intensity Volitional Cough: Cognitively unable to elicit Volitional Swallow: Unable to elicit    Oral/Motor/Sensory Function Overall Oral Motor/Sensory Function: Other (comment) (inconsistently following commands to assess but definitely with L facial droop)   Ice Chips Ice chips: Impaired Presentation: Spoon Oral Phase Impairments: Poor awareness of bolus Oral Phase Functional Implications: Oral holding Pharyngeal Phase Impairments: Cough - Delayed   Thin Liquid Thin Liquid: Within functional limits Presentation: Spoon    Nectar Thick Nectar Thick Liquid: Not tested   Honey Thick Honey Thick Liquid: Not tested   Puree Puree: Not tested   Solid     Solid: Not tested      Osie Bond., M.A. [place] Office [phone]  Secure chat preferred  06/15/2021,9:58 AM

## 2021-06-15 NOTE — Progress Notes (Signed)
  Transition of Care [facility]) Screening Note   Patient Details  Name: Raymond [name] Date of Birth: 1960-10-29   Transition of Care [facility]) CM/SW Contact:    Benard Halsted, LCSW Phone Number: 06/15/2021, 9:33 AM    Transition of Care Department [facility]) has reviewed patient. We will continue to monitor patient advancement through interdisciplinary progression rounds and assist in coordinating an appropriate discharge plan. If new patient transition needs arise, please place a TOC consult.

## 2021-06-15 NOTE — Progress Notes (Signed)
Engineer, maintenance [facility]) Hospital Liaison note.   Received request from [employer] for family interest in [facility]. Spoke with the spouse who opted to transfer patient tomorrow if bed was still available. [facility] will follow and reach out to spouse in the AM. Patient has been approved for [facility] per [facility] MD.  Please do not hesitate to call with questions.   Thank you,  Clementeen Hoof, [place], BSN   [facility] Liaison  [phone]

## 2021-06-15 NOTE — Progress Notes (Signed)
Nutrition Brief Note  RD received a consult for assessment of nutritional status. Chart reviewed, pt now transitioning to comfort care. Plan to admit to [facility].    No further nutrition interventions planned at this time. Please re-consult as needed.   Hermina Barters RD, LDN Clinical Dietitian See Shea Evans for contact information.

## 2021-06-15 NOTE — Progress Notes (Signed)
This chaplain responded to PMT PA-Josseline's consult for support requested by the Pt. wife. The chaplain also received a referral for spiritual care from Jovita Kussmaul at [facility].  The chaplain is present at the bedside with the Pt. No visitors at this time. The Pt. opens his eyes to respond to his name but remains lethargic throughout the visit. With the chaplain, the Pt. attempts to respond are greater when speaking of the Pt. wife and the Pt. faith.   The chaplain checked in with the RN-Antwanique after the visit. The chaplain understands the RN's experience with the Pt. is similar.  The chaplain is available for F/U spiritual care as needed.  Chaplain Sallyanne Kuster [phone]

## 2021-06-15 NOTE — Progress Notes (Signed)
  Echocardiogram 2D Echocardiogram has been performed.  Merrie Roof F 06/15/2021, 1:07 PM

## 2021-06-15 NOTE — Progress Notes (Addendum)
STROKE TEAM PROGRESS NOTE   INTERVAL HISTORY Patient laying in bed in NAD. Eyes open and wake. Patient is oriented to name, place and year. does not name objects, follows commands. Right gaze preference, does not cross midline. Mild left facial droop. Right arm 5/5, no movement of left arm, bilateral lowers 2/5, sensation is normal   Vitals:   06/15/21 0400 06/15/21 0500 06/15/21 0600 06/15/21 0900  BP: (!) 143/77 (!) 155/86 133/71 (!) 152/74  Pulse: (!) 148 94 99 96  Resp: (!) 23 18 20 20   Temp:    98.3 F (36.8 C)  TempSrc:    Oral  SpO2: 95% 94% 91% 94%  Weight:      Height:       CBC:  Recent Labs  Lab 06/09/21 0900 06/14/21 1318 06/14/21 1325  WBC 5.1 11.6*  --   NEUTROABS 3.8 11.0*  --   HGB 9.6* 9.6* 9.9*  HCT 30.4* 31.1* 29.0*  MCV 89.9 93.1  --   PLT 109* 50*  --    Basic Metabolic Panel:  Recent Labs  Lab 06/14/21 1314 06/14/21 1318 06/14/21 1325 06/15/21 0135  NA  --  141 140 141  K  --  3.2* 3.1* 4.0  CL  --  107 106 105  CO2  --  21*  --  22  GLUCOSE  --  155* 151* 93  BUN  --  16 16 16   CREATININE  --  0.98 0.70 0.77  CALCIUM  --  9.4  --  9.5  MG 1.9  --   --   --    Lipid Panel:  Recent Labs  Lab 06/15/21 0135  CHOL 163  TRIG 96  HDL 34*  CHOLHDL 4.8  VLDL 19  LDLCALC 110*   HgbA1c:  Recent Labs  Lab 06/14/21 1314  HGBA1C 4.9     IMAGING past 24 hours CT ANGIO HEAD NECK W WO CM (CODE STROKE)  Addendum Date: 06/15/2021   ADDENDUM REPORT: 06/15/2021 08:51 ADDENDUM: Upon re-review of this exam, the following findings are noted: Abrupt termination of Right MCA M1 enhancement at the bifurcation, partially affecting MCA M2 branches there. This is most apparent on series 11, image 20, and compatible with ELVO, concordant with subsequent MRI demonstrating areas of acute right MCA territory ischemia. This was discussed by telephone with Neurology Dr. Corine Shelter On 06/15/2021 at 08:51 . Electronically Signed   By: Genevie Ann M.D.   On: 06/15/2021  08:51   Result Date: 06/15/2021 CLINICAL DATA:  Neuro deficit, acute, stroke suspected. Personal history of lung cancer. Brain metastases. EXAM: CT ANGIOGRAPHY HEAD AND NECK TECHNIQUE: Multidetector CT imaging of the head and neck was performed using the standard protocol during bolus administration of intravenous contrast. Multiplanar CT image reconstructions and MIPs were obtained to evaluate the vascular anatomy. Carotid stenosis measurements (when applicable) are obtained utilizing NASCET criteria, using the distal internal carotid diameter as the denominator. RADIATION DOSE REDUCTION: This exam was performed according to the departmental dose-optimization program which includes automated exposure control, adjustment of the mA and/or kV according to patient size and/or use of iterative reconstruction technique. CONTRAST:  75 mL Omnipaque 350 COMPARISON:  CT head without contrast 06/14/2021. MR head without and with contrast 05/07/2021 FINDINGS: CTA NECK FINDINGS Aortic arch: Common origin of left common carotid artery and innominate artery noted. No significant atherosclerotic disease or stenosis. Right carotid system: The right common carotid artery is within normal limits. Bifurcation is unremarkable. Cervical right  ICA is within normal limits. Left carotid system: Left common carotid artery is within normal limits. Bifurcation is unremarkable. Cervical left ICA is within normal limits. Vertebral arteries: The vertebral arteries are codominant. Both vertebral arteries originate from the subclavian arteries without significant stenosis. No significant stenosis is present in either vertebral artery in the neck. Skeleton: Degenerative changes are present lower cervical spine. Multiple lucent lesions are present throughout the spine the posterior cortex is destroyed at T3 with tumor extending into the spinal canal. No pathologic fractures are present. Other neck: Soft tissues the neck are otherwise unremarkable.  Salivary glands are within normal limits. Thyroid is normal. No significant adenopathy is present. No focal mucosal or submucosal lesions are present. Upper chest: Numerous pulmonary metastases are present. Left perihilar lung mass extends to the chest wall measuring over 6 cm. Extensive involvement of the left third rib is again noted. Review of the MIP images confirms the above findings CTA HEAD FINDINGS Anterior circulation: Atherosclerotic changes are present the cavernous internal carotid arteries bilaterally without a significant stenosis from the skull base through the ICA termini. The A1 and M1 segments are normal. The MCA bifurcations are within normal limits. Moderate 10 UA shin of MCA branch vessels is noted bilaterally, right greater than left. Moderate 10 UA shin of distal ACA branch vessels noted as well. Posterior circulation: The vertebral arteries are codominant. PICA origins are visualized and normal. Vertebrobasilar junction and basilar artery is normal. Both posterior cerebral arteries originate from basilar tip. Moderate attenuation of P3 branch vessels noted bilaterally. Venous sinuses: The dural sinuses are patent. The straight sinus deep cerebral veins are intact. Cortical veins are within normal limits. No significant vascular malformation is evident. Anatomic variants: None Review of the MIP images confirms the above findings IMPRESSION: 1. No emergent large vessel occlusion. 2. No significant vascular disease in the neck. 3. Moderate diffuse small vessel disease without a significant proximal stenosis, aneurysm, or branch vessel occlusion within the Circle of Willis. 4. Extensive osseous metastases with destruction of the posterior cortex at T3 and tumor extending into the spinal canal. 5. Left perihilar lung mass extends to the chest wall measuring over 6 cm. 6. Numerous pulmonary metastases. Electronically Signed: By: San Morelle M.D. On: 06/14/2021 14:09   DG CHEST PORT 1  VIEW  Result Date: 06/14/2021 CLINICAL DATA:  Lung cancer.  Altered mental status. EXAM: PORTABLE CHEST 1 VIEW COMPARISON:  06/09/2021 FINDINGS: Left upper lobe mass and numerous bilateral pulmonary nodules are again noted as seen on prior chest x-ray and chest CT. No significant change since prior study. Heart is normal size. No effusions or pneumothorax. No acute bony abnormality. IMPRESSION: Left upper lobe mass with numerous bilateral pulmonary metastases, unchanged. Electronically Signed   By: Rolm Baptise M.D.   On: 06/14/2021 19:02   MR THORACIC SPINE W WO CONTRAST  Result Date: 06/14/2021 CLINICAL DATA:  Brain/CNS neoplasm. Assess treatment response. History of non-small cell lung cancer. EXAM: MRI THORACIC WITHOUT AND WITH CONTRAST TECHNIQUE: Multiplanar and multiecho pulse sequences of the thoracic spine were obtained without and with intravenous contrast. CONTRAST:  68mL GADAVIST GADOBUTROL 1 MMOL/ML IV SOLN COMPARISON:  Thoracic MRI 05/20/2019. Abdominal CT 06/09/2021 and 05/12/2021. Chest CT 05/12/2021 and 04/29/2021. PET-CT 10/02/2020. FINDINGS: Alignment:  Normal. Vertebrae: There is widespread osseous metastatic disease throughout the visualized thoracic spine as well as the lower cervical and upper lumbar spine. The osseous involvement is greatest within the T3, T4, T5 and T11 vertebral bodies. No significant  pathologic fractures are seen. Cord: Normal in signal and caliber. No abnormal intradural enhancement. Paraspinal and other soft tissues: As demonstrated on previous CTs, there are multiple pulmonary nodules bilaterally, largest in the left upper lobe. There are scattered areas of epidural tumor, most prominent anteriorly on the right at T3. No resulting cord deformity or high-grade foraminal narrowing. Disc levels: Mild multilevel spondylosis with disc bulging and facet hypertrophy, similar to previous MRI. No large disc herniation, high-grade spinal stenosis or definite nerve root  encroachment. IMPRESSION: 1. Multifocal osseous metastatic disease with associated mild epidural tumor, most prominent at T3. 2. No evidence of cord compression or high-grade spinal stenosis. 3. Known widespread pulmonary metastases, grossly stable. 4. Chronic spondylosis, similar to previous MRI. Electronically Signed   By: Richardean Sale M.D.   On: 06/14/2021 18:19   MR BRAIN WO CONTRAST  Result Date: 06/14/2021 CLINICAL DATA:  Found unresponsive this morning. History of metastatic disease to the brain. EXAM: MRI HEAD WITHOUT CONTRAST TECHNIQUE: Multiplanar, multiecho pulse sequences of the brain and surrounding structures were obtained without intravenous contrast. COMPARISON:  Same-day CT/CTA head, brain MRI 05/07/2021 FINDINGS: Brain: There is cortically based diffusion restriction in the right insula and parietal cortex with associated mild FLAIR signal abnormality consistent with evolving acute infarct in the MCA distribution. There is no associated hemorrhage. Punctate foci of diffusion restriction in the pons and bilateral cerebellar hemispheres may reflect additional tiny infarcts versus signal abnormality related to known metastatic disease. The known small metastatic lesions seen on the prior MRI from 05/07/2021 are suboptimally assessed on the current study given lack of intravenous contrast. Small areas of edema most notably in the right frontal lobe (6-24) and left insula (6-18) are noted. Scattered small foci of SWI signal dropout likely reflect intralesional hemorrhage associated with the metastatic lesions. There is no significant mass effect. There is no midline shift. Vascular: The major intracranial flow voids are normal. Skull and upper cervical spine: A few T1 hypointense lesions in the left parietal and occipital calvarium are not significantly changed going back multiple prior studies. There is no suspicious signal abnormality in the imaged upper cervical spine. Sinuses/Orbits: There is  moderate mucosal thickening throughout the paranasal sinuses. Other: There are bilateral mastoid effusions, unchanged. IMPRESSION: 1. Diffusion restriction in the right insula and parietal lobe cortex consistent with evolving acute infarct in the MCA distribution. 2. Additional punctate foci of diffusion restriction in the cerebellum and pons may reflect additional tiny infarcts versus signal abnormality related to metastatic disease. 3. The known metastatic lesions are suboptimally assessed on the current study given lack of intravenous contrast. There is no significant mass effect or midline shift. These results were called by telephone at the time of interpretation on 06/14/2021 at 5:27 pm to provider Dr Francia Greaves, who verbally acknowledged these results. Electronically Signed   By: Valetta Mole M.D.   On: 06/14/2021 17:25   CT HEAD CODE STROKE WO CONTRAST  Result Date: 06/14/2021 CLINICAL DATA:  Code stroke. Neuro deficit, acute, stroke suspected. Personal history of lung cancer. Brain metastases. EXAM: CT HEAD WITHOUT CONTRAST TECHNIQUE: Contiguous axial images were obtained from the base of the skull through the vertex without intravenous contrast. RADIATION DOSE REDUCTION: This exam was performed according to the departmental dose-optimization program which includes automated exposure control, adjustment of the mA and/or kV according to patient size and/or use of iterative reconstruction technique. COMPARISON:  MR head without and with contrast 05/07/2021. FINDINGS: Brain: The small metastases identified by MRI are  not discerned on the CT scan. Minimal edema is noted adjacent to the left insular lesion. Mild diffuse white matter disease is again noted. The ventricles are of normal size. No significant extraaxial fluid collection is present. The brainstem and cerebellum are within normal limits. Vascular: Atherosclerotic calcifications are present within the cavernous internal carotid arteries. No hyperdense  vessel is present. Skull: Calvarium is intact. No focal lytic or blastic lesions are present. No significant extracranial soft tissue lesion is present. Sinuses/Orbits: Diffuse mucosal thickening is present throughout the ethmoid air cells, sphenoid sinuses and left greater than right maxillary sinus. Diffuse mucosal thickening is present in the frontal sinuses. Fluid levels are present in the sphenoid sinuses. The globes and orbits are within normal limits. ASPECTS [facility] Stroke Program Early CT Score) - Ganglionic level infarction (caudate, lentiform nuclei, internal capsule, insula, M1-M3 cortex): 7/7 - Supraganglionic infarction (M4-M6 cortex): 3/3 Total score (0-10 with 10 being normal): 10/10 IMPRESSION: 1. The small metastases identified by MRI are not discerned on the CT scan. 2. Minimal edema adjacent to the left insular lesion. 3. Otherwise stable appearance of diffuse white matter disease. 4. ASPECTS is 10/10. The above was relayed via text pager to Dr. Theda Sers on 06/14/2021 at 13:34 . Electronically Signed   By: San Morelle M.D.   On: 06/14/2021 13:35    PHYSICAL EXAM General AA male laying in bed in NAD  Neurological  Eyes open and wake. Patient is oriented to name, place and year. does not name objects, follows commands. Right gaze preference, does not cross midline. Mild left facial droop. Right arm 5/5, no movement of left arm, bilateral lowers 2/5, sensation is normal   ASSESSMENT/PLAN Raymond [name] is a 61 y.o. 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 Drain was removed and he was placed on IV abx and discharged on augment. He presents to ED today for AMS. EMS found him unresponsive.   Stroke:  Acute right MCA ischemic  infarct  Code Stroke CT head The small metastases identified by MRI are not discerned on the CT scan.Minimal edema adjacent to the left insular lesion. ASPECTS 10.    CTA head & neck  Abrupt termination of Right MCA M1 enhancement at the bifurcation, partially affecting MCA M2 branches there. No significant vascular disease in the neck. Moderate diffuse small vessel disease without a significant proximal stenosis, aneurysm, or branch vessel occlusion within the Circle of Willis. Extensive osseous metastases with destruction of the posterior cortex at T3 and tumor extending into the spinal canal. Left perihilar lung mass extends to the chest wall measuring over 6 cm. Numerous pulmonary metastases.  MRI   1. Diffusion restriction in the right insula and parietal lobe cortex consistent with evolving acute infarct in the MCA distribution. Additional punctate foci of diffusion restriction in the cerebellum and pons may reflect additional tiny infarcts versus signal abnormality related to metastatic disease. The known metastatic lesions are suboptimally assessed on the current study given lack of intravenous contrast. There is no significant mass effect or midline shift.  2D Echo Ef 65-70%  LDL 110 HgbA1c 4.9 VTE prophylaxis - SCD's    Diet   Diet NPO time specified   No antithrombotic prior to admission, now on No antithrombotic.  Therapy  recommendations:  pending Disposition:  pending  Hypertension Home meds:  none Stable Permissive hypertension (OK if < 220/120) but gradually normalize in 5-7 days Long-term BP goal normotensive  Hyperlipidemia Home meds:  none,  LDL 110, goal < 70 Continue statin at discharge  Diabetes type II Controlled Home meds:  none  HgbA1c 4.9, goal < 7.0 CBGs Recent Labs    06/14/21 1318  GLUCAP 155*    SSI  Other Stroke Risk Factors Obesity, Body mass index is 32.53 kg/m., BMI >/= 30 associated with increased stroke risk, recommend  weight loss, diet and exercise as appropriate  Coronary artery disease Obstructive sleep apnea, not CPAP at home   Other Active Problems Dysphagia  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 Now in hospice care.   Neurology will sign off.  Dr. Reeves Forth evaluated pt independently, reviewed imaging, chart, labs. Discussed and formulated plan with the APP. Please see APP note above for details.   Decie Verne,MD   To contact Stroke Continuity provider, please refer to [email or web address] After hours, contact General Neurology

## 2021-06-15 NOTE — TOC Progression Note (Signed)
Transition of Care [facility]) - Progression Note    Patient Details  Name: Raymond [name] MRN: [id number] Date of Birth: 03-09-1960  [facility] CM/SW [place], LCSW Phone Number: 06/15/2021, 12:36 PM  Clinical Narrative:    CSW received consult for residential hospice placement with spouse's preference of [facility] if possible. CSW sent referral to [place] for review.         Expected Discharge Plan and Services                                                 Social Determinants of Health (SDOH) Interventions    Readmission Risk Interventions    05/01/2021    5:25 PM 03/04/2021   11:02 AM  Readmission Risk Prevention Plan  Transportation Screening Complete Complete  PCP or Specialist Appt within 3-5 Days  Complete  HRI or [place]  Complete  Social Work Consult for [place] Planning/Counseling  Complete  Palliative Care Screening  Not Applicable  Medication Review [employer]) Complete Complete  SW Recovery Care/Counseling Consult Complete   Palliative Care Screening Complete   [place] Not Applicable

## 2021-06-15 NOTE — Progress Notes (Signed)
PROGRESS NOTE        PATIENT DETAILS Name: Raymond [name] Age: 61 y.o. Sex: male Date of Birth: [birth year] Admit Date: 06/14/2021 Admitting Physician Orma Flaming, MD JJO:[id number], Iona Beard, MD  Brief Summary: Patient is a 61 y.o.  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  Was found to have acute CVA and subsequently admitted to the hospitalist service.  See below for further details.   Significant events: 6/12>> admit to [facility] for acute CVA  Significant studies: 6/12>> CXR: Numerous bilateral pulmonary nodules-no obvious PNA. 6/12>> CT head: No acute abnormalities 6/12>> MRI brain: Acute CVA involving MCA distribution 6/12>> MRI T-spine: No cord compression-multifocal osseous mets most prominent at T3.  Widespread metastatic disease in thoracic spine/lower cervical and upper lumbar spine as well. 6/12>> CTA head/neck: Abrupt termination of MCA M1-no major stenosis in the neck.   6/12>> A1c: 4.9 6/13>> LDL: 110  Significant microbiology data:   Procedures:   Consults: Neuro, palliative care  Subjective: Left facial droop-continues to have left hemineglect.  Follows commands-voice is soft but clear.  Objective: Vitals: Blood pressure (!) 152/74, pulse 96, temperature 98.3 F (36.8 C), temperature source Oral, resp. rate 20, height 5\' 11"  (1.803 m), weight 105.8 kg, SpO2 94 %.   Exam: Gen Exam:Alert awake-not in any distress HEENT:atraumatic, normocephalic Chest: B/L clear to auscultation anteriorly CVS:S1S2 regular Abdomen:soft non tender, non distended Extremities:no edema Neurology: Left facial droop-left-sided weakness-approximately 3/5. Skin: no rash  Pertinent Labs/Radiology:    Latest Ref Rng & Units 06/14/2021    1:25 PM 06/14/2021    1:18 PM 06/09/2021    9:00 AM  CBC  WBC 4.0 - 10.5 K/uL  11.6  5.1   Hemoglobin 13.0 - 17.0 g/dL 9.9  9.6  9.6    Hematocrit 39.0 - 52.0 % 29.0  31.1  30.4   Platelets 150 - 400 K/uL  50  109     Lab Results  Component Value Date   NA 141 06/15/2021   K 4.0 06/15/2021   CL 105 06/15/2021   CO2 22 06/15/2021      Assessment/Plan: Acute metabolic encephalopathy: Unclear if this was from CVA-he has improved-but not sure if he is back to his baseline-no indication of infection-no major electrolyte disorders.  Suspect if this was from polypharmacy/narcotics.  Check EEG.  Acute right MCA CVA: Unclear whether this is thrombotic or embolic-work-up in progress-await further input from stroke MD.  On ASA-we will plan on starting statins once oral intake resumes.  Dysphagia: Due to acute CVA-SLP following-n.p.o. recommended-we will keep on IVF for now.  Thrombocytopenia: Possibly from chemotherapy-repeat CBC-no obvious bleeding  Normocytic anemia: Due to underlying malignancy-follow CBC periodically.  Hypokalemia: Replete and recheck.  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  Overall prognosis I suspect is poor at this point.  See palliative care discussion below.  Chronic pain syndrome: Related to underlying cancer-sees outpatient palliative care-on chronic narcotics.  Unable to tolerate any oral intake at this point-given the fact that he is at risk of withdrawal symptoms-I will put him on as needed IV narcotics.  HTN: Allow permissive hypertension-hold all antihypertensives.  BPH: Resume Flomax when safely able to tolerate  p.o.  Cognitive impairment: Appears to be mild-resume Namenda when safely able to swallow  Palliative care: I reached out to his oncologist-Dr. Julien Nordmann on 6/13-his overall prognosis is terrible-he apparently is on a third line chemotherapy with gemcitabine-that is not working.  He does not think  patient would be a candidate for any further chemotherapy at this point and strongly suggests transitioning to hospice care.  Unfortunately-patient/family has been resistant to hospice care in the past.  We will continue to engage with family/patient and the palliative care team.  Remains a full code for now.  Obesity: Estimated body mass index is 32.53 kg/m as calculated from the following:   Height as of this encounter: 5\' 11"  (1.803 m).   Weight as of this encounter: 105.8 kg.   Code status:   Code Status: Full Code   DVT Prophylaxis: SCD's Start: 06/14/21 0272 hold pharmacological agents due to thrombocytopenia.   Family Communication: Spouse [name] [phone]-updated over the phone on 6/13.  She is aware of poor overall prognosis and recommendations to transition to hospice care   Disposition Plan: Status is: Inpatient Remains inpatient appropriate because: Acute CVA-dysphagia-not yet ready for discharge-May need SNF/hospice care.   Planned Discharge Destination:Skilled nursing facility   Diet: Diet Order             Diet NPO time specified  Diet effective now                     Antimicrobial agents: Anti-infectives (From admission, onward)    None        MEDICATIONS: Scheduled Meds:   stroke: early stages of recovery book   Does not apply Once   sodium chloride flush  3 mL Intravenous Once   Continuous Infusions: PRN Meds:.acetaminophen **OR** acetaminophen (TYLENOL) oral liquid 160 mg/5 mL **OR** acetaminophen   I have personally reviewed following labs and imaging studies  LABORATORY DATA: CBC: Recent Labs  Lab 06/09/21 0738 06/09/21 0900 06/14/21 1318 06/14/21 1325  WBC 4.5 5.1 11.6*  --   NEUTROABS 3.2 3.8 11.0*  --   HGB 9.5* 9.6* 9.6* 9.9*  HCT 29.2* 30.4* 31.1* 29.0*  MCV 87.4 89.9 93.1  --   PLT 96* 109* 50*  --     Basic Metabolic Panel: Recent Labs  Lab 06/09/21 0738 06/09/21 0900 06/14/21 1314 06/14/21 1318  06/14/21 1325 06/15/21 0135  NA 141 142  --  141 140 141  K 3.3* 3.4*  --  3.2* 3.1* 4.0  CL 106 107  --  107 106 105  CO2 25 25  --  21*  --  22  GLUCOSE 113* 106*  --  155* 151* 93  BUN 18 19  --  16 16 16   CREATININE 0.67 0.75  --  0.98 0.70 0.77  CALCIUM 9.6 9.6  --  9.4  --  9.5  MG  --   --  1.9  --   --   --     GFR: Estimated Creatinine Clearance: 124.6 mL/min (by C-G formula based on SCr of 0.77 mg/dL).  Liver Function Tests: Recent Labs  Lab 06/09/21 0738 06/14/21 1318  AST 32 31  ALT 15 20  ALKPHOS 153* 152*  BILITOT 0.7 1.4*  PROT 6.4* 6.1*  ALBUMIN 3.4* 2.7*   No results for input(s): "LIPASE", "AMYLASE" in the last 168 hours. Recent Labs  Lab 06/14/21 1314  AMMONIA 34    Coagulation Profile: Recent Labs  Lab 06/14/21 1318  INR  1.3*    Cardiac Enzymes: No results for input(s): "CKTOTAL", "CKMB", "CKMBINDEX", "TROPONINI" in the last 168 hours.  BNP (last 3 results) No results for input(s): "PROBNP" in the last 8760 hours.  Lipid Profile: Recent Labs    06/15/21 0135  CHOL 163  HDL 34*  LDLCALC 110*  TRIG 96  CHOLHDL 4.8    Thyroid Function Tests: Recent Labs    06/14/21 1314  TSH 0.542    Anemia Panel: Recent Labs    06/14/21 1314  VITAMINB12 1,798*    Urine analysis:    Component Value Date/Time   COLORURINE YELLOW 06/14/2021 1314   APPEARANCEUR HAZY (A) 06/14/2021 1314   LABSPEC >1.046 (H) 06/14/2021 1314   PHURINE 5.0 06/14/2021 1314   GLUCOSEU NEGATIVE 06/14/2021 1314   HGBUR NEGATIVE 06/14/2021 1314   HGBUR moderate 10/17/2007 1442   BILIRUBINUR NEGATIVE 06/14/2021 1314   KETONESUR 20 (A) 06/14/2021 1314   PROTEINUR NEGATIVE 06/14/2021 1314   UROBILINOGEN 1.0 07/17/2013 1048   NITRITE NEGATIVE 06/14/2021 1314   LEUKOCYTESUR NEGATIVE 06/14/2021 1314    Sepsis Labs: Lactic Acid, Venous    Component Value Date/Time   LATICACIDVEN 1.3 04/27/2021 2215    MICROBIOLOGY: No results found for this or any  previous visit (from the past 240 hour(s)).  RADIOLOGY STUDIES/RESULTS: CT ANGIO HEAD NECK W WO CM (CODE STROKE)  Addendum Date: 06/15/2021   ADDENDUM REPORT: 06/15/2021 08:51 ADDENDUM: Upon re-review of this exam, the following findings are noted: Abrupt termination of Right MCA M1 enhancement at the bifurcation, partially affecting MCA M2 branches there. This is most apparent on series 11, image 20, and compatible with ELVO, concordant with subsequent MRI demonstrating areas of acute right MCA territory ischemia. This was discussed by telephone with Neurology Dr. Corine Shelter On 06/15/2021 at 08:51 . Electronically Signed   By: Genevie Ann M.D.   On: 06/15/2021 08:51   Result Date: 06/15/2021 CLINICAL DATA:  Neuro deficit, acute, stroke suspected. Personal history of lung cancer. Brain metastases. EXAM: CT ANGIOGRAPHY HEAD AND NECK TECHNIQUE: Multidetector CT imaging of the head and neck was performed using the standard protocol during bolus administration of intravenous contrast. Multiplanar CT image reconstructions and MIPs were obtained to evaluate the vascular anatomy. Carotid stenosis measurements (when applicable) are obtained utilizing NASCET criteria, using the distal internal carotid diameter as the denominator. RADIATION DOSE REDUCTION: This exam was performed according to the departmental dose-optimization program which includes automated exposure control, adjustment of the mA and/or kV according to patient size and/or use of iterative reconstruction technique. CONTRAST:  75 mL Omnipaque 350 COMPARISON:  CT head without contrast 06/14/2021. MR head without and with contrast 05/07/2021 FINDINGS: CTA NECK FINDINGS Aortic arch: Common origin of left common carotid artery and innominate artery noted. No significant atherosclerotic disease or stenosis. Right carotid system: The right common carotid artery is within normal limits. Bifurcation is unremarkable. Cervical right ICA is within normal limits. Left  carotid system: Left common carotid artery is within normal limits. Bifurcation is unremarkable. Cervical left ICA is within normal limits. Vertebral arteries: The vertebral arteries are codominant. Both vertebral arteries originate from the subclavian arteries without significant stenosis. No significant stenosis is present in either vertebral artery in the neck. Skeleton: Degenerative changes are present lower cervical spine. Multiple lucent lesions are present throughout the spine the posterior cortex is destroyed at T3 with tumor extending into the spinal canal. No pathologic fractures are present. Other neck: Soft tissues the neck are otherwise unremarkable. Salivary glands  are within normal limits. Thyroid is normal. No significant adenopathy is present. No focal mucosal or submucosal lesions are present. Upper chest: Numerous pulmonary metastases are present. Left perihilar lung mass extends to the chest wall measuring over 6 cm. Extensive involvement of the left third rib is again noted. Review of the MIP images confirms the above findings CTA HEAD FINDINGS Anterior circulation: Atherosclerotic changes are present the cavernous internal carotid arteries bilaterally without a significant stenosis from the skull base through the ICA termini. The A1 and M1 segments are normal. The MCA bifurcations are within normal limits. Moderate 10 UA shin of MCA branch vessels is noted bilaterally, right greater than left. Moderate 10 UA shin of distal ACA branch vessels noted as well. Posterior circulation: The vertebral arteries are codominant. PICA origins are visualized and normal. Vertebrobasilar junction and basilar artery is normal. Both posterior cerebral arteries originate from basilar tip. Moderate attenuation of P3 branch vessels noted bilaterally. Venous sinuses: The dural sinuses are patent. The straight sinus deep cerebral veins are intact. Cortical veins are within normal limits. No significant vascular  malformation is evident. Anatomic variants: None Review of the MIP images confirms the above findings IMPRESSION: 1. No emergent large vessel occlusion. 2. No significant vascular disease in the neck. 3. Moderate diffuse small vessel disease without a significant proximal stenosis, aneurysm, or branch vessel occlusion within the Circle of Willis. 4. Extensive osseous metastases with destruction of the posterior cortex at T3 and tumor extending into the spinal canal. 5. Left perihilar lung mass extends to the chest wall measuring over 6 cm. 6. Numerous pulmonary metastases. Electronically Signed: By: San Morelle M.D. On: 06/14/2021 14:09   DG CHEST PORT 1 VIEW  Result Date: 06/14/2021 CLINICAL DATA:  Lung cancer.  Altered mental status. EXAM: PORTABLE CHEST 1 VIEW COMPARISON:  06/09/2021 FINDINGS: Left upper lobe mass and numerous bilateral pulmonary nodules are again noted as seen on prior chest x-ray and chest CT. No significant change since prior study. Heart is normal size. No effusions or pneumothorax. No acute bony abnormality. IMPRESSION: Left upper lobe mass with numerous bilateral pulmonary metastases, unchanged. Electronically Signed   By: Rolm Baptise M.D.   On: 06/14/2021 19:02   MR THORACIC SPINE W WO CONTRAST  Result Date: 06/14/2021 CLINICAL DATA:  Brain/CNS neoplasm. Assess treatment response. History of non-small cell lung cancer. EXAM: MRI THORACIC WITHOUT AND WITH CONTRAST TECHNIQUE: Multiplanar and multiecho pulse sequences of the thoracic spine were obtained without and with intravenous contrast. CONTRAST:  71mL GADAVIST GADOBUTROL 1 MMOL/ML IV SOLN COMPARISON:  Thoracic MRI 05/20/2019. Abdominal CT 06/09/2021 and 05/12/2021. Chest CT 05/12/2021 and 04/29/2021. PET-CT 10/02/2020. FINDINGS: Alignment:  Normal. Vertebrae: There is widespread osseous metastatic disease throughout the visualized thoracic spine as well as the lower cervical and upper lumbar spine. The osseous  involvement is greatest within the T3, T4, T5 and T11 vertebral bodies. No significant pathologic fractures are seen. Cord: Normal in signal and caliber. No abnormal intradural enhancement. Paraspinal and other soft tissues: As demonstrated on previous CTs, there are multiple pulmonary nodules bilaterally, largest in the left upper lobe. There are scattered areas of epidural tumor, most prominent anteriorly on the right at T3. No resulting cord deformity or high-grade foraminal narrowing. Disc levels: Mild multilevel spondylosis with disc bulging and facet hypertrophy, similar to previous MRI. No large disc herniation, high-grade spinal stenosis or definite nerve root encroachment. IMPRESSION: 1. Multifocal osseous metastatic disease with associated mild epidural tumor, most prominent at T3. 2.  No evidence of cord compression or high-grade spinal stenosis. 3. Known widespread pulmonary metastases, grossly stable. 4. Chronic spondylosis, similar to previous MRI. Electronically Signed   By: Richardean Sale M.D.   On: 06/14/2021 18:19   MR BRAIN WO CONTRAST  Result Date: 06/14/2021 CLINICAL DATA:  Found unresponsive this morning. History of metastatic disease to the brain. EXAM: MRI HEAD WITHOUT CONTRAST TECHNIQUE: Multiplanar, multiecho pulse sequences of the brain and surrounding structures were obtained without intravenous contrast. COMPARISON:  Same-day CT/CTA head, brain MRI 05/07/2021 FINDINGS: Brain: There is cortically based diffusion restriction in the right insula and parietal cortex with associated mild FLAIR signal abnormality consistent with evolving acute infarct in the MCA distribution. There is no associated hemorrhage. Punctate foci of diffusion restriction in the pons and bilateral cerebellar hemispheres may reflect additional tiny infarcts versus signal abnormality related to known metastatic disease. The known small metastatic lesions seen on the prior MRI from 05/07/2021 are suboptimally  assessed on the current study given lack of intravenous contrast. Small areas of edema most notably in the right frontal lobe (6-24) and left insula (6-18) are noted. Scattered small foci of SWI signal dropout likely reflect intralesional hemorrhage associated with the metastatic lesions. There is no significant mass effect. There is no midline shift. Vascular: The major intracranial flow voids are normal. Skull and upper cervical spine: A few T1 hypointense lesions in the left parietal and occipital calvarium are not significantly changed going back multiple prior studies. There is no suspicious signal abnormality in the imaged upper cervical spine. Sinuses/Orbits: There is moderate mucosal thickening throughout the paranasal sinuses. Other: There are bilateral mastoid effusions, unchanged. IMPRESSION: 1. Diffusion restriction in the right insula and parietal lobe cortex consistent with evolving acute infarct in the MCA distribution. 2. Additional punctate foci of diffusion restriction in the cerebellum and pons may reflect additional tiny infarcts versus signal abnormality related to metastatic disease. 3. The known metastatic lesions are suboptimally assessed on the current study given lack of intravenous contrast. There is no significant mass effect or midline shift. These results were called by telephone at the time of interpretation on 06/14/2021 at 5:27 pm to provider Dr Francia Greaves, who verbally acknowledged these results. Electronically Signed   By: Valetta Mole M.D.   On: 06/14/2021 17:25   CT HEAD CODE STROKE WO CONTRAST  Result Date: 06/14/2021 CLINICAL DATA:  Code stroke. Neuro deficit, acute, stroke suspected. Personal history of lung cancer. Brain metastases. EXAM: CT HEAD WITHOUT CONTRAST TECHNIQUE: Contiguous axial images were obtained from the base of the skull through the vertex without intravenous contrast. RADIATION DOSE REDUCTION: This exam was performed according to the departmental  dose-optimization program which includes automated exposure control, adjustment of the mA and/or kV according to patient size and/or use of iterative reconstruction technique. COMPARISON:  MR head without and with contrast 05/07/2021. FINDINGS: Brain: The small metastases identified by MRI are not discerned on the CT scan. Minimal edema is noted adjacent to the left insular lesion. Mild diffuse white matter disease is again noted. The ventricles are of normal size. No significant extraaxial fluid collection is present. The brainstem and cerebellum are within normal limits. Vascular: Atherosclerotic calcifications are present within the cavernous internal carotid arteries. No hyperdense vessel is present. Skull: Calvarium is intact. No focal lytic or blastic lesions are present. No significant extracranial soft tissue lesion is present. Sinuses/Orbits: Diffuse mucosal thickening is present throughout the ethmoid air cells, sphenoid sinuses and left greater than right maxillary sinus. Diffuse  mucosal thickening is present in the frontal sinuses. Fluid levels are present in the sphenoid sinuses. The globes and orbits are within normal limits. ASPECTS [facility] Stroke Program Early CT Score) - Ganglionic level infarction (caudate, lentiform nuclei, internal capsule, insula, M1-M3 cortex): 7/7 - Supraganglionic infarction (M4-M6 cortex): 3/3 Total score (0-10 with 10 being normal): 10/10 IMPRESSION: 1. The small metastases identified by MRI are not discerned on the CT scan. 2. Minimal edema adjacent to the left insular lesion. 3. Otherwise stable appearance of diffuse white matter disease. 4. ASPECTS is 10/10. The above was relayed via text pager to Dr. Theda Sers on 06/14/2021 at 13:34 . Electronically Signed   By: San Morelle M.D.   On: 06/14/2021 13:35     LOS: 1 day   Oren Binet, MD  [facility]    To contact the attending provider between 7A-7P or the covering provider during after hours  7P-7A, please log into the web site [email or web address] and access using universal [place] password for that web site. If you do not have the password, please call the hospital operator.  06/15/2021, 10:46 AM

## 2021-06-15 NOTE — TOC CAGE-AID Note (Signed)
Transition of Care [facility]) - CAGE-AID Screening   Patient Details  Name: DSEAN VANTOL MRN: [id number] Date of Birth: 06-Jan-1960  Transition of Care [facility]) CM/SW Contact:    Eulas Schweitzer C Tarpley-Carter, [place] Phone Number: 06/15/2021, 2:09 PM   Clinical Narrative: [name] unable to participate in Cage Aid. [name] under palliative care.   Tris Howell Tarpley-Carter, MSW, LCSW-A Pronouns:  She/Her/Hers [facility] HealthTransitions of Care Clinical Social Worker Direct Number:  [phone] [email or web address]  CAGE-AID Screening: Substance Abuse Screening unable to be completed due to: : Patient unable to participate

## 2021-06-16 ENCOUNTER — Ambulatory Visit: Payer: Medicaid Other

## 2021-06-16 ENCOUNTER — Encounter: Payer: Self-pay | Admitting: Radiation Oncology

## 2021-06-16 DIAGNOSIS — G9340 Encephalopathy, unspecified: Secondary | ICD-10-CM | POA: Diagnosis not present

## 2021-06-16 MED ORDER — GLYCOPYRROLATE 1 MG PO TABS: 1.0000 mg | ORAL_TABLET | ORAL | Status: AC | PRN

## 2021-06-16 MED ORDER — HYDROMORPHONE HCL 1 MG/ML IJ SOLN
1.0000 mg | INTRAMUSCULAR | 0 refills | Status: AC | PRN
Start: 2021-06-16 — End: ?

## 2021-06-16 MED ORDER — LORAZEPAM 2 MG/ML PO CONC
1.0000 mg | ORAL | 0 refills | Status: AC | PRN
Start: 2021-06-16 — End: ?

## 2021-06-16 NOTE — TOC Transition Note (Signed)
Transition of Care [facility]) - CM/SW Discharge Note   Patient Details  Name: Raymond [name] MRN: [id number] Date of Birth: 1960-04-24  Transition of Care [facility]) CM/SW Contact:  Benard Halsted, LCSW Phone Number: 06/16/2021, 2:46 PM   Clinical Narrative:    Patient will DC to: [facility] Anticipated DC date: 06/16/21 Family notified: Spouse Transport by: Corey Harold   Per MD patient ready for DC to [facility]. RN to call report prior to discharge [phone]). RN, patient, patient's family, and facility notified of DC. Discharge Summary sent to facility. DC packet on chart with signed DNR. Ambulance transport requested for patient.   CSW will sign off for now as social work intervention is no longer needed. Please consult [place] again if new needs arise.     Final next level of care: [place] Barriers to Discharge: No Barriers Identified   Patient Goals and CMS Choice Patient states their goals for this hospitalization and ongoing recovery are:: comfort CMS Medicare.gov Compare Post Acute Care list provided to:: Patient Represenative (must comment) Choice offered to / list presented to : Spouse  Discharge Placement                Patient to be transferred to facility by: [place] Name of family member notified: Spouse Patient and family notified of of transfer: 06/16/21  Discharge Plan and Services                                     Social Determinants of Health (SDOH) Interventions     Readmission Risk Interventions    05/01/2021    5:25 PM 03/04/2021   11:02 AM  Readmission Risk Prevention Plan  Transportation Screening Complete Complete  PCP or Specialist Appt within 3-5 Days  Complete  HRI or [place]  Complete  Social Work Consult for [place] Planning/Counseling  Complete  Palliative Care Screening  Not Applicable  Medication Review [employer] photographer) Complete Complete  SW Recovery Care/Counseling Consult  Complete   Palliative Care Screening Complete   [place] Not Applicable

## 2021-06-16 NOTE — Progress Notes (Signed)
Manufacturing engineer [facility])  [facility] has a bed for Raymond [name] today.  Pending completion of consents, transport can be arranged.   Updated TOC managers.  RN staff, please call report to [phone]. Room is assigned when report is called. If his wife is not present when transport arrives, please call her so she can expect when to meet him at [facility].  Thank you, Venia Carbon DNP, RN [facility] Liaison

## 2021-06-16 NOTE — Discharge Summary (Signed)
PATIENT DETAILS Name: Raymond [name] Age: 61 y.o. Sex: male Date of Birth: October 17, 1960 MRN: [id number]. Admitting Physician: Orma Flaming, MD LOV:[id number], Iona Beard, MD  Admit Date: 06/14/2021 Discharge date: 06/16/2021  Recommendations for Outpatient Follow-up:  Optimize comfort measures  Admitted From:  Home  Disposition: Hospice care   Discharge Condition: poor  CODE STATUS:   Code Status: DNR   Diet recommendation:  Diet Order             Diet general           Diet NPO time specified  Diet effective now                    Brief Summary: Patient is a 61 y.o.  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  Was found to have acute CVA and subsequently admitted to the hospitalist service.  See below for further details.     Significant events: 6/12>> admit to [facility] for acute CVA 6/13>> transition to comfort care   Significant studies: 6/12>> CXR: Numerous bilateral pulmonary nodules-no obvious PNA. 6/12>> CT head: No acute abnormalities 6/12>> MRI brain: Acute CVA involving MCA distribution 6/12>> MRI T-spine: No cord compression-multifocal osseous mets most prominent at T3.  Widespread metastatic disease in thoracic spine/lower cervical and upper lumbar spine as well. 6/12>> CTA head/neck: Abrupt termination of MCA M1-no major stenosis in the neck.   6/12>> A1c: 4.9 6/13>> LDL: 110   Significant microbiology data:     Procedures:     Consults: Neuro, palliative care    Brief Hospital Course: Acute metabolic encephalopathy: Probably multifactorial from CVA/brain mets/narcotics/polypharmacy use-more awake than he first arrived.  Given poor overall prognosis-transitioned to comfort care-and residential hospice discharge planned   Acute right MCA CVA: Unclear whether this is thrombotic or embolic-work-up as above-given poor prognosis with underlying malignancy-has been  transitioned to full comfort measures.    Dysphagia: Due to acute CVA-SLP following-n.p.o. recommended-since being transition to comfort care-okay for comfort feeds.   Thrombocytopenia: Possibly from chemotherapy-no plans to follow CBC at this point-comfort care status.  Normocytic anemia: Due to underlying malignancy-no plans to follow CBC at this point-comfort care status.   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  Overall prognosis is very poor.See palliative care discussion below.   Chronic pain syndrome: Related to underlying cancer-sees outpatient palliative care-on chronic narcotics.  Continue with as needed narcotics.   HTN: Allow permissive hypertension-hold all antihypertensives.   BPH: No plans to resume Flomax-comfort care status.   Cognitive impairment: Appears to be mild-no plans to resume Namenda-comfort care status  Palliative care: I reached out to his oncologist-Dr. Julien Nordmann on 6/13-his overall prognosis is terrible-he apparently is on a third line chemotherapy with gemcitabine-that is not working.  He does not think patient would be a candidate for any further chemotherapy at this point and strongly suggests transitioning to hospice care.  Unfortunately-patient/family has been resistant to hospice care in the past.  After extensive discussion with the palliative care team-he was made DNR and transition to full comfort measures-with plans to discharge to [facility] later today.   Obesity: Estimated body mass index is 32.53 kg/m as calculated from the following:   Height as of this encounter: 5\' 11"  (1.803 m).   Weight as of  this encounter: 105.8 kg.      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   Increase activity slowly   Complete by: As directed       Allergies as of 06/16/2021       Reactions   Benadryl [diphenhydramine] Itching   01/24/21 pt states this was a one time reaction and that he takes now when needed, also reported 05/12/21        Medication List     STOP taking these medications    acetaminophen 650 MG CR tablet Commonly known as: TYLENOL   Benefiber Drink Mix Pack   dexamethasone 4 MG tablet Commonly known as: DECADRON   Ensure Plus Liqd   fluticasone 50 MCG/ACT nasal spray Commonly known as: FLONASE   folic acid 1 MG tablet Commonly known as: FOLVITE   gabapentin 300 MG capsule Commonly known as: Neurontin   memantine 10 MG tablet Commonly known as: Namenda   mirtazapine 30 MG tablet Commonly known as: Remeron   morphine 15 MG 12 hr tablet Commonly known as: MS CONTIN   multivitamin with minerals Tabs tablet   ondansetron 8 MG tablet Commonly known as: ZOFRAN   oxyCODONE 5 MG immediate release tablet Commonly known as: Oxy IR/ROXICODONE   Oxycodone HCl 10 MG Tabs   polyethylene glycol powder 17 GM/SCOOP powder Commonly known as: GLYCOLAX/MIRALAX   prochlorperazine 10 MG tablet Commonly known as: COMPAZINE   senna-docusate 8.6-50 MG tablet Commonly known as: Senna S   vitamin B-12 1000 MCG tablet Commonly known as: CYANOCOBALAMIN       TAKE these medications    glycopyrrolate 1 MG tablet Commonly known as: ROBINUL Take 1 tablet (1  mg total) by mouth every 4 (four) hours as needed (excessive secretions).   HYDROmorphone 1 MG/ML injection Commonly known as: DILAUDID Inject 1 mL (1 mg total) into the vein every 2 (two) hours as needed for severe pain (or dyspnea).   LORazepam 2 MG/ML concentrated solution Commonly known as: ATIVAN Place 0.5 mLs (1 mg total) under the tongue every 2 (two) hours as needed for anxiety.        Follow-up Information     Osei-Bonsu, Iona Beard, MD. Schedule an appointment as soon as possible for a visit.   Specialty: Internal Medicine Why: As needed Contact information: [address] [facility] [address] [phone]                Allergies  Allergen Reactions   Benadryl [Diphenhydramine] Itching    01/24/21 pt states this was a one time reaction and that he takes now when needed, also reported 05/12/21     Other Procedures/Studies: ECHOCARDIOGRAM COMPLETE  Result Date: 06/15/2021    ECHOCARDIOGRAM REPORT   Patient Name:   GAILEN VENNE Date of Exam: 06/15/2021 Medical Rec #:  [id number]       Height:       71.0 in Accession #:  [id number]      Weight:       233.2 lb Date of Birth:  05-19-1960       BSA:          2.251 m Patient Age:    75 years        BP:           148/82 mmHg Patient Gender: M               HR:           90 bpm. Exam Location:  Inpatient Procedure: 2D Echo, Cardiac Doppler and Color Doppler Indications:    Stroke  History:        Patient has prior history of Echocardiogram examinations, most                 recent 09/04/2020. Stroke, Signs/Symptoms:Altered Mental Status;                 Risk Factors:Hypertension. Metastatic cancer.  Sonographer:    Merrie Roof RDCS Referring Phys: [id number] [place]  1. Left ventricular ejection fraction, by estimation, is 65 to 70%. The left ventricle has normal function. The left ventricle has no regional wall motion abnormalities. There is mild left ventricular hypertrophy. Left ventricular diastolic  parameters were normal.  2. Right ventricular systolic function is normal. The right ventricular size is normal. Tricuspid regurgitation signal is inadequate for assessing PA pressure.  3. The mitral valve is normal in structure. No evidence of mitral valve regurgitation. No evidence of mitral stenosis.  4. The aortic valve was not well visualized. Aortic valve regurgitation is not visualized. No aortic stenosis is present.  5. The inferior vena cava is normal in size with greater than 50% respiratory variability, suggesting right atrial pressure of 3 mmHg. FINDINGS  Left Ventricle: Left ventricular ejection fraction, by estimation, is 65 to 70%. The left ventricle has normal function. The left ventricle has no regional wall motion abnormalities. The left ventricular internal cavity size was normal in size. There is  mild left ventricular hypertrophy. Left ventricular diastolic parameters were normal. Right Ventricle: The right ventricular size is normal. No increase in right ventricular wall thickness. Right ventricular systolic function is normal. Tricuspid regurgitation signal is inadequate for assessing PA pressure. Left Atrium: Left atrial size was normal in size. Right Atrium: Right atrial size was normal in size. Pericardium: Trivial pericardial effusion is present. Mitral Valve: The mitral valve is normal in structure. No evidence of mitral valve regurgitation. No evidence of mitral valve stenosis. Tricuspid Valve: The tricuspid valve is normal in structure. Tricuspid valve regurgitation is trivial. Aortic Valve: The aortic valve was not well visualized. Aortic valve regurgitation is not visualized. No aortic stenosis is present. Aortic valve mean gradient measures 6.0 mmHg. Aortic valve peak gradient measures 11.7 mmHg. Aortic valve area, by VTI measures 3.08 cm. Pulmonic Valve: The pulmonic valve was not well visualized. Pulmonic valve regurgitation is trivial. Aorta: The aortic root and ascending aorta  are structurally normal, with no evidence of dilitation. Venous: The inferior vena cava is normal in size with greater than 50% respiratory variability, suggesting right atrial pressure of 3 mmHg. IAS/Shunts: The interatrial septum was not well visualized.  LEFT VENTRICLE PLAX 2D LVIDd:         5.00 cm   Diastology LVIDs:         2.90 cm   LV e' medial:    10.80 cm/s LV PW:  1.20 cm   LV E/e' medial:  7.4 LV IVS:        1.00 cm   LV e' lateral:   9.25 cm/s LVOT diam:     2.30 cm   LV E/e' lateral: 8.7 LV SV:         81 LV SV Index:   36 LVOT Area:     4.15 cm  RIGHT VENTRICLE RV Basal diam:  4.10 cm RV Mid diam:    3.00 cm RV S prime:     18.30 cm/s TAPSE (M-mode): 1.7 cm LEFT ATRIUM           Index        RIGHT ATRIUM           Index LA diam:      3.30 cm 1.47 cm/m   RA Area:     17.30 cm LA Vol (A2C): 52.0 ml 23.10 ml/m  RA Volume:   49.80 ml  22.12 ml/m LA Vol (A4C): 63.6 ml 28.25 ml/m  AORTIC VALVE AV Area (Vmax):    2.84 cm AV Area (Vmean):   2.96 cm AV Area (VTI):     3.08 cm AV Vmax:           171.00 cm/s AV Vmean:          113.000 cm/s AV VTI:            0.263 m AV Peak Grad:      11.7 mmHg AV Mean Grad:      6.0 mmHg LVOT Vmax:         117.00 cm/s LVOT Vmean:        80.400 cm/s LVOT VTI:          0.195 m LVOT/AV VTI ratio: 0.74  AORTA Ao Root diam: 3.80 cm Ao Asc diam:  3.70 cm MITRAL VALVE MV Area (PHT): 3.45 cm    SHUNTS MV Decel Time: 220 msec    Systemic VTI:  0.20 m MV E velocity: 80.20 cm/s  Systemic Diam: 2.30 cm MV A velocity: 62.40 cm/s MV E/A ratio:  1.29 Oswaldo Milian MD Electronically signed by Oswaldo Milian MD Signature Date/Time: 06/15/2021/1:18:11 PM    Final    CT ANGIO HEAD NECK W WO CM (CODE STROKE)  Addendum Date: 06/15/2021   ADDENDUM REPORT: 06/15/2021 08:51 ADDENDUM: Upon re-review of this exam, the following findings are noted: Abrupt termination of Right MCA M1 enhancement at the bifurcation, partially affecting MCA M2 branches there. This is most  apparent on series 11, image 20, and compatible with ELVO, concordant with subsequent MRI demonstrating areas of acute right MCA territory ischemia. This was discussed by telephone with Neurology Dr. Corine Shelter On 06/15/2021 at 08:51 . Electronically Signed   By: Genevie Ann M.D.   On: 06/15/2021 08:51   Result Date: 06/15/2021 CLINICAL DATA:  Neuro deficit, acute, stroke suspected. Personal history of lung cancer. Brain metastases. EXAM: CT ANGIOGRAPHY HEAD AND NECK TECHNIQUE: Multidetector CT imaging of the head and neck was performed using the standard protocol during bolus administration of intravenous contrast. Multiplanar CT image reconstructions and MIPs were obtained to evaluate the vascular anatomy. Carotid stenosis measurements (when applicable) are obtained utilizing NASCET criteria, using the distal internal carotid diameter as the denominator. RADIATION DOSE REDUCTION: This exam was performed according to the departmental dose-optimization program which includes automated exposure control, adjustment of the mA and/or kV according to patient size and/or use of iterative reconstruction technique. CONTRAST:  75 mL  Omnipaque 350 COMPARISON:  CT head without contrast 06/14/2021. MR head without and with contrast 05/07/2021 FINDINGS: CTA NECK FINDINGS Aortic arch: Common origin of left common carotid artery and innominate artery noted. No significant atherosclerotic disease or stenosis. Right carotid system: The right common carotid artery is within normal limits. Bifurcation is unremarkable. Cervical right ICA is within normal limits. Left carotid system: Left common carotid artery is within normal limits. Bifurcation is unremarkable. Cervical left ICA is within normal limits. Vertebral arteries: The vertebral arteries are codominant. Both vertebral arteries originate from the subclavian arteries without significant stenosis. No significant stenosis is present in either vertebral artery in the neck. Skeleton:  Degenerative changes are present lower cervical spine. Multiple lucent lesions are present throughout the spine the posterior cortex is destroyed at T3 with tumor extending into the spinal canal. No pathologic fractures are present. Other neck: Soft tissues the neck are otherwise unremarkable. Salivary glands are within normal limits. Thyroid is normal. No significant adenopathy is present. No focal mucosal or submucosal lesions are present. Upper chest: Numerous pulmonary metastases are present. Left perihilar lung mass extends to the chest wall measuring over 6 cm. Extensive involvement of the left third rib is again noted. Review of the MIP images confirms the above findings CTA HEAD FINDINGS Anterior circulation: Atherosclerotic changes are present the cavernous internal carotid arteries bilaterally without a significant stenosis from the skull base through the ICA termini. The A1 and M1 segments are normal. The MCA bifurcations are within normal limits. Moderate 10 UA shin of MCA branch vessels is noted bilaterally, right greater than left. Moderate 10 UA shin of distal ACA branch vessels noted as well. Posterior circulation: The vertebral arteries are codominant. PICA origins are visualized and normal. Vertebrobasilar junction and basilar artery is normal. Both posterior cerebral arteries originate from basilar tip. Moderate attenuation of P3 branch vessels noted bilaterally. Venous sinuses: The dural sinuses are patent. The straight sinus deep cerebral veins are intact. Cortical veins are within normal limits. No significant vascular malformation is evident. Anatomic variants: None Review of the MIP images confirms the above findings IMPRESSION: 1. No emergent large vessel occlusion. 2. No significant vascular disease in the neck. 3. Moderate diffuse small vessel disease without a significant proximal stenosis, aneurysm, or branch vessel occlusion within the Circle of Willis. 4. Extensive osseous metastases  with destruction of the posterior cortex at T3 and tumor extending into the spinal canal. 5. Left perihilar lung mass extends to the chest wall measuring over 6 cm. 6. Numerous pulmonary metastases. Electronically Signed: By: San Morelle M.D. On: 06/14/2021 14:09   DG CHEST PORT 1 VIEW  Result Date: 06/14/2021 CLINICAL DATA:  Lung cancer.  Altered mental status. EXAM: PORTABLE CHEST 1 VIEW COMPARISON:  06/09/2021 FINDINGS: Left upper lobe mass and numerous bilateral pulmonary nodules are again noted as seen on prior chest x-ray and chest CT. No significant change since prior study. Heart is normal size. No effusions or pneumothorax. No acute bony abnormality. IMPRESSION: Left upper lobe mass with numerous bilateral pulmonary metastases, unchanged. Electronically Signed   By: Rolm Baptise M.D.   On: 06/14/2021 19:02   MR THORACIC SPINE W WO CONTRAST  Result Date: 06/14/2021 CLINICAL DATA:  Brain/CNS neoplasm. Assess treatment response. History of non-small cell lung cancer. EXAM: MRI THORACIC WITHOUT AND WITH CONTRAST TECHNIQUE: Multiplanar and multiecho pulse sequences of the thoracic spine were obtained without and with intravenous contrast. CONTRAST:  33mL GADAVIST GADOBUTROL 1 MMOL/ML IV SOLN COMPARISON:  Thoracic  MRI 05/20/2019. Abdominal CT 06/09/2021 and 05/12/2021. Chest CT 05/12/2021 and 04/29/2021. PET-CT 10/02/2020. FINDINGS: Alignment:  Normal. Vertebrae: There is widespread osseous metastatic disease throughout the visualized thoracic spine as well as the lower cervical and upper lumbar spine. The osseous involvement is greatest within the T3, T4, T5 and T11 vertebral bodies. No significant pathologic fractures are seen. Cord: Normal in signal and caliber. No abnormal intradural enhancement. Paraspinal and other soft tissues: As demonstrated on previous CTs, there are multiple pulmonary nodules bilaterally, largest in the left upper lobe. There are scattered areas of epidural tumor, most  prominent anteriorly on the right at T3. No resulting cord deformity or high-grade foraminal narrowing. Disc levels: Mild multilevel spondylosis with disc bulging and facet hypertrophy, similar to previous MRI. No large disc herniation, high-grade spinal stenosis or definite nerve root encroachment. IMPRESSION: 1. Multifocal osseous metastatic disease with associated mild epidural tumor, most prominent at T3. 2. No evidence of cord compression or high-grade spinal stenosis. 3. Known widespread pulmonary metastases, grossly stable. 4. Chronic spondylosis, similar to previous MRI. Electronically Signed   By: Richardean Sale M.D.   On: 06/14/2021 18:19   MR BRAIN WO CONTRAST  Result Date: 06/14/2021 CLINICAL DATA:  Found unresponsive this morning. History of metastatic disease to the brain. EXAM: MRI HEAD WITHOUT CONTRAST TECHNIQUE: Multiplanar, multiecho pulse sequences of the brain and surrounding structures were obtained without intravenous contrast. COMPARISON:  Same-day CT/CTA head, brain MRI 05/07/2021 FINDINGS: Brain: There is cortically based diffusion restriction in the right insula and parietal cortex with associated mild FLAIR signal abnormality consistent with evolving acute infarct in the MCA distribution. There is no associated hemorrhage. Punctate foci of diffusion restriction in the pons and bilateral cerebellar hemispheres may reflect additional tiny infarcts versus signal abnormality related to known metastatic disease. The known small metastatic lesions seen on the prior MRI from 05/07/2021 are suboptimally assessed on the current study given lack of intravenous contrast. Small areas of edema most notably in the right frontal lobe (6-24) and left insula (6-18) are noted. Scattered small foci of SWI signal dropout likely reflect intralesional hemorrhage associated with the metastatic lesions. There is no significant mass effect. There is no midline shift. Vascular: The major intracranial flow  voids are normal. Skull and upper cervical spine: A few T1 hypointense lesions in the left parietal and occipital calvarium are not significantly changed going back multiple prior studies. There is no suspicious signal abnormality in the imaged upper cervical spine. Sinuses/Orbits: There is moderate mucosal thickening throughout the paranasal sinuses. Other: There are bilateral mastoid effusions, unchanged. IMPRESSION: 1. Diffusion restriction in the right insula and parietal lobe cortex consistent with evolving acute infarct in the MCA distribution. 2. Additional punctate foci of diffusion restriction in the cerebellum and pons may reflect additional tiny infarcts versus signal abnormality related to metastatic disease. 3. The known metastatic lesions are suboptimally assessed on the current study given lack of intravenous contrast. There is no significant mass effect or midline shift. These results were called by telephone at the time of interpretation on 06/14/2021 at 5:27 pm to provider Dr Francia Greaves, who verbally acknowledged these results. Electronically Signed   By: Valetta Mole M.D.   On: 06/14/2021 17:25   CT HEAD CODE STROKE WO CONTRAST  Result Date: 06/14/2021 CLINICAL DATA:  Code stroke. Neuro deficit, acute, stroke suspected. Personal history of lung cancer. Brain metastases. EXAM: CT HEAD WITHOUT CONTRAST TECHNIQUE: Contiguous axial images were obtained from the base of the skull through the  vertex without intravenous contrast. RADIATION DOSE REDUCTION: This exam was performed according to the departmental dose-optimization program which includes automated exposure control, adjustment of the mA and/or kV according to patient size and/or use of iterative reconstruction technique. COMPARISON:  MR head without and with contrast 05/07/2021. FINDINGS: Brain: The small metastases identified by MRI are not discerned on the CT scan. Minimal edema is noted adjacent to the left insular lesion. Mild diffuse white  matter disease is again noted. The ventricles are of normal size. No significant extraaxial fluid collection is present. The brainstem and cerebellum are within normal limits. Vascular: Atherosclerotic calcifications are present within the cavernous internal carotid arteries. No hyperdense vessel is present. Skull: Calvarium is intact. No focal lytic or blastic lesions are present. No significant extracranial soft tissue lesion is present. Sinuses/Orbits: Diffuse mucosal thickening is present throughout the ethmoid air cells, sphenoid sinuses and left greater than right maxillary sinus. Diffuse mucosal thickening is present in the frontal sinuses. Fluid levels are present in the sphenoid sinuses. The globes and orbits are within normal limits. ASPECTS [facility] Stroke Program Early CT Score) - Ganglionic level infarction (caudate, lentiform nuclei, internal capsule, insula, M1-M3 cortex): 7/7 - Supraganglionic infarction (M4-M6 cortex): 3/3 Total score (0-10 with 10 being normal): 10/10 IMPRESSION: 1. The small metastases identified by MRI are not discerned on the CT scan. 2. Minimal edema adjacent to the left insular lesion. 3. Otherwise stable appearance of diffuse white matter disease. 4. ASPECTS is 10/10. The above was relayed via text pager to Dr. Theda Sers on 06/14/2021 at 13:34 . Electronically Signed   By: San Morelle M.D.   On: 06/14/2021 13:35   CT Renal Stone Study  Result Date: 06/09/2021 CLINICAL DATA:  Flank pain, kidney stone suspected EXAM: CT ABDOMEN AND PELVIS WITHOUT CONTRAST TECHNIQUE: Multidetector CT imaging of the abdomen and pelvis was performed following the standard protocol without IV contrast. RADIATION DOSE REDUCTION: This exam was performed according to the departmental dose-optimization program which includes automated exposure control, adjustment of the mA and/or kV according to patient size and/or use of iterative reconstruction technique. COMPARISON:  CT abdomen/pelvis May  10, 23. FINDINGS: Lower chest: Partially imaged numerous memory nodules, compatible with metastatic disease which may be progressed from May 10. Hepatobiliary: Many subtle hypodense lesions within the liver, suspicious for metastatic disease. Cholelithiasis. Pancreas: Unremarkable. No pancreatic ductal dilatation or surrounding inflammatory changes. Spleen: Normal in size without focal abnormality. Adrenals/Urinary Tract: Adrenal glands are unremarkable. Small, nonobstructing renal calculi bilaterally. No hydronephrosis. No ureteric dilation or calculi identified. Unremarkable bladder. Stomach/Bowel: Unremarkable stomach. Mild dilation of the appendix (8 mm) without significant inflammatory change. No dilated bowel loops to suggest obstruction. Redemonstrated focal wall thickening of the sigmoid colon with mild surrounding inflammatory change. Trace gas within the overlying musculature probably is related to previously seen abscess and drain. No discrete, drainable fluid collection. Vascular/Lymphatic: Aortic atherosclerosis. No enlarged abdominal or pelvic lymph nodes. Reproductive: Prostatic calcifications, nonspecific. Musculoskeletal: Mild linear scarring in the region of previous strain. No fluid collection. Severe lower lumbar degenerative disc disease. Multiple lytic and sclerotic lesions throughout the visualized spine and pelvic bones, compatible with known osseous metastatic disease. IMPRESSION: 1. Small nonobstructing renal calculi. No hydronephrosis or ureteric calculi identified. 2. Persistent sigmoid colonic wall thickening and inflammatory change without discrete, drainable fluid collection. 3. Mild dilation of the appendix without significant inflammatory change. Findings are thought to be unlikely secondary to appendicitis, but given the findings are new early appendicitis is not excluded. Recommend  clinical correlation for signs/symptoms of early appendicitis. 4. Many subtle hypodense lesions  within the liver, suspicious for metastatic disease. MRI could further characterize if clinically warranted. 5. Partially imaged numerous pulmonary nodules, compatible with metastatic disease which may be progressed from May 10. A dedicated CT of the chest could better characterize/compare if clinically warranted. 6. Osseous metastatic disease. 7. Cholelithiasis. 8.  Aortic Atherosclerosis (ICD10-I70.0). Electronically Signed   By: Margaretha Sheffield M.D.   On: 06/09/2021 13:36   DG Chest 2 View  Result Date: 06/09/2021 CLINICAL DATA:  Shortness of breath, worsening right rib pain, fall several days ago, initial encounter. EXAM: CHEST - 2 VIEW COMPARISON:  06/07/2021 and CT chest 05/12/2021. FINDINGS: Trachea is midline. Heart size normal. Diffuse nodularity throughout the lungs with masslike consolidation in the left upper lobe, at the site of a fiducial marker. Findings appear fairly similar to 06/07/2021. No pleural fluid. Osseous structures appear grossly intact. IMPRESSION: Left upper lobe mass with bilateral pulmonary metastases, as on recent exams. No definite superimposed acute finding. Electronically Signed   By: Lorin Picket M.D.   On: 06/09/2021 09:28   DG Ribs Bilateral  Result Date: 06/08/2021 CLINICAL DATA:  Status post fall with bilateral rib pain. Known mass in the left lung with bilateral pulmonary metastasis. EXAM: BILATERAL RIBS - 3+ VIEW COMPARISON:  May 12, 2021 chest x-ray and chest CT FINDINGS: No acute fracture or dislocation is identified in bilateral ribs. There is generalized lytic lucency in the left third rib correlating to the bony metastatic change seen on CT. IMPRESSION: No acute fracture or dislocation. Generalized lytic lucency in the left third rib correlating to the bony metastatic change seen on CT. Electronically Signed   By: Abelardo Diesel M.D.   On: 06/08/2021 16:26     TODAY-DAY OF DISCHARGE:  Subjective:   Lovie Chol toda appears comfortable.  Objective:    Blood pressure (!) 158/80, pulse 96, temperature 98.4 F (36.9 C), temperature source Oral, resp. rate (!) 21, height 5\' 11"  (1.803 m), weight 105.8 kg, SpO2 97 %.  Intake/Output Summary (Last 24 hours) at 06/16/2021 1121 Last data filed at 06/15/2021 1500 Gross per 24 hour  Intake 0 ml  Output 400 ml  Net -400 ml   Filed Weights   06/14/21 1327  Weight: 105.8 kg    Exam: Awake Alert, Oriented *3, No new F.N deficits, Normal affect [place].AT,PERRAL Supple Neck,No JVD, No cervical lymphadenopathy appriciated.  Symmetrical Chest wall movement, Good air movement bilaterally, CTAB RRR,No Gallops,Rubs or new Murmurs, No Parasternal Heave +ve B.Sounds, Abd Soft, Non tender, No organomegaly appriciated, No rebound -guarding or rigidity. No Cyanosis, Clubbing or edema, No new Rash or bruise   PERTINENT RADIOLOGIC STUDIES: ECHOCARDIOGRAM COMPLETE  Result Date: 06/15/2021    ECHOCARDIOGRAM REPORT   Patient Name:   NAREN BENALLY Date of Exam: 06/15/2021 Medical Rec #:  [id number]       Height:       71.0 in Accession #:    [id number]      Weight:       233.2 lb Date of Birth:  [birth year]       BSA:          2.251 m Patient Age:    21 years        BP:           148/82 mmHg Patient Gender: M               HR:  90 bpm. Exam Location:  Inpatient Procedure: 2D Echo, Cardiac Doppler and Color Doppler Indications:    Stroke  History:        Patient has prior history of Echocardiogram examinations, most                 recent 09/04/2020. Stroke, Signs/Symptoms:Altered Mental Status;                 Risk Factors:Hypertension. Metastatic cancer.  Sonographer:    Merrie Roof RDCS Referring Phys: [id number] [place]  1. Left ventricular ejection fraction, by estimation, is 65 to 70%. The left ventricle has normal function. The left ventricle has no regional wall motion abnormalities. There is mild left ventricular hypertrophy. Left ventricular diastolic parameters were normal.  2. Right  ventricular systolic function is normal. The right ventricular size is normal. Tricuspid regurgitation signal is inadequate for assessing PA pressure.  3. The mitral valve is normal in structure. No evidence of mitral valve regurgitation. No evidence of mitral stenosis.  4. The aortic valve was not well visualized. Aortic valve regurgitation is not visualized. No aortic stenosis is present.  5. The inferior vena cava is normal in size with greater than 50% respiratory variability, suggesting right atrial pressure of 3 mmHg. FINDINGS  Left Ventricle: Left ventricular ejection fraction, by estimation, is 65 to 70%. The left ventricle has normal function. The left ventricle has no regional wall motion abnormalities. The left ventricular internal cavity size was normal in size. There is  mild left ventricular hypertrophy. Left ventricular diastolic parameters were normal. Right Ventricle: The right ventricular size is normal. No increase in right ventricular wall thickness. Right ventricular systolic function is normal. Tricuspid regurgitation signal is inadequate for assessing PA pressure. Left Atrium: Left atrial size was normal in size. Right Atrium: Right atrial size was normal in size. Pericardium: Trivial pericardial effusion is present. Mitral Valve: The mitral valve is normal in structure. No evidence of mitral valve regurgitation. No evidence of mitral valve stenosis. Tricuspid Valve: The tricuspid valve is normal in structure. Tricuspid valve regurgitation is trivial. Aortic Valve: The aortic valve was not well visualized. Aortic valve regurgitation is not visualized. No aortic stenosis is present. Aortic valve mean gradient measures 6.0 mmHg. Aortic valve peak gradient measures 11.7 mmHg. Aortic valve area, by VTI measures 3.08 cm. Pulmonic Valve: The pulmonic valve was not well visualized. Pulmonic valve regurgitation is trivial. Aorta: The aortic root and ascending aorta are structurally normal, with no  evidence of dilitation. Venous: The inferior vena cava is normal in size with greater than 50% respiratory variability, suggesting right atrial pressure of 3 mmHg. IAS/Shunts: The interatrial septum was not well visualized.  LEFT VENTRICLE PLAX 2D LVIDd:         5.00 cm   Diastology LVIDs:         2.90 cm   LV e' medial:    10.80 cm/s LV PW:         1.20 cm   LV E/e' medial:  7.4 LV IVS:        1.00 cm   LV e' lateral:   9.25 cm/s LVOT diam:     2.30 cm   LV E/e' lateral: 8.7 LV SV:         81 LV SV Index:   36 LVOT Area:     4.15 cm  RIGHT VENTRICLE RV Basal diam:  4.10 cm RV Mid diam:    3.00 cm RV S prime:  18.30 cm/s TAPSE (M-mode): 1.7 cm LEFT ATRIUM           Index        RIGHT ATRIUM           Index LA diam:      3.30 cm 1.47 cm/m   RA Area:     17.30 cm LA Vol (A2C): 52.0 ml 23.10 ml/m  RA Volume:   49.80 ml  22.12 ml/m LA Vol (A4C): 63.6 ml 28.25 ml/m  AORTIC VALVE AV Area (Vmax):    2.84 cm AV Area (Vmean):   2.96 cm AV Area (VTI):     3.08 cm AV Vmax:           171.00 cm/s AV Vmean:          113.000 cm/s AV VTI:            0.263 m AV Peak Grad:      11.7 mmHg AV Mean Grad:      6.0 mmHg LVOT Vmax:         117.00 cm/s LVOT Vmean:        80.400 cm/s LVOT VTI:          0.195 m LVOT/AV VTI ratio: 0.74  AORTA Ao Root diam: 3.80 cm Ao Asc diam:  3.70 cm MITRAL VALVE MV Area (PHT): 3.45 cm    SHUNTS MV Decel Time: 220 msec    Systemic VTI:  0.20 m MV E velocity: 80.20 cm/s  Systemic Diam: 2.30 cm MV A velocity: 62.40 cm/s MV E/A ratio:  1.29 Oswaldo Milian MD Electronically signed by Oswaldo Milian MD Signature Date/Time: 06/15/2021/1:18:11 PM    Final    CT ANGIO HEAD NECK W WO CM (CODE STROKE)  Addendum Date: 06/15/2021   ADDENDUM REPORT: 06/15/2021 08:51 ADDENDUM: Upon re-review of this exam, the following findings are noted: Abrupt termination of Right MCA M1 enhancement at the bifurcation, partially affecting MCA M2 branches there. This is most apparent on series 11, image 20,  and compatible with ELVO, concordant with subsequent MRI demonstrating areas of acute right MCA territory ischemia. This was discussed by telephone with Neurology Dr. Corine Shelter On 06/15/2021 at 08:51 . Electronically Signed   By: Genevie Ann M.D.   On: 06/15/2021 08:51   Result Date: 06/15/2021 CLINICAL DATA:  Neuro deficit, acute, stroke suspected. Personal history of lung cancer. Brain metastases. EXAM: CT ANGIOGRAPHY HEAD AND NECK TECHNIQUE: Multidetector CT imaging of the head and neck was performed using the standard protocol during bolus administration of intravenous contrast. Multiplanar CT image reconstructions and MIPs were obtained to evaluate the vascular anatomy. Carotid stenosis measurements (when applicable) are obtained utilizing NASCET criteria, using the distal internal carotid diameter as the denominator. RADIATION DOSE REDUCTION: This exam was performed according to the departmental dose-optimization program which includes automated exposure control, adjustment of the mA and/or kV according to patient size and/or use of iterative reconstruction technique. CONTRAST:  75 mL Omnipaque 350 COMPARISON:  CT head without contrast 06/14/2021. MR head without and with contrast 05/07/2021 FINDINGS: CTA NECK FINDINGS Aortic arch: Common origin of left common carotid artery and innominate artery noted. No significant atherosclerotic disease or stenosis. Right carotid system: The right common carotid artery is within normal limits. Bifurcation is unremarkable. Cervical right ICA is within normal limits. Left carotid system: Left common carotid artery is within normal limits. Bifurcation is unremarkable. Cervical left ICA is within normal limits. Vertebral arteries: The vertebral arteries are codominant. Both vertebral arteries  originate from the subclavian arteries without significant stenosis. No significant stenosis is present in either vertebral artery in the neck. Skeleton: Degenerative changes are present  lower cervical spine. Multiple lucent lesions are present throughout the spine the posterior cortex is destroyed at T3 with tumor extending into the spinal canal. No pathologic fractures are present. Other neck: Soft tissues the neck are otherwise unremarkable. Salivary glands are within normal limits. Thyroid is normal. No significant adenopathy is present. No focal mucosal or submucosal lesions are present. Upper chest: Numerous pulmonary metastases are present. Left perihilar lung mass extends to the chest wall measuring over 6 cm. Extensive involvement of the left third rib is again noted. Review of the MIP images confirms the above findings CTA HEAD FINDINGS Anterior circulation: Atherosclerotic changes are present the cavernous internal carotid arteries bilaterally without a significant stenosis from the skull base through the ICA termini. The A1 and M1 segments are normal. The MCA bifurcations are within normal limits. Moderate 10 UA shin of MCA branch vessels is noted bilaterally, right greater than left. Moderate 10 UA shin of distal ACA branch vessels noted as well. Posterior circulation: The vertebral arteries are codominant. PICA origins are visualized and normal. Vertebrobasilar junction and basilar artery is normal. Both posterior cerebral arteries originate from basilar tip. Moderate attenuation of P3 branch vessels noted bilaterally. Venous sinuses: The dural sinuses are patent. The straight sinus deep cerebral veins are intact. Cortical veins are within normal limits. No significant vascular malformation is evident. Anatomic variants: None Review of the MIP images confirms the above findings IMPRESSION: 1. No emergent large vessel occlusion. 2. No significant vascular disease in the neck. 3. Moderate diffuse small vessel disease without a significant proximal stenosis, aneurysm, or branch vessel occlusion within the Circle of Willis. 4. Extensive osseous metastases with destruction of the posterior  cortex at T3 and tumor extending into the spinal canal. 5. Left perihilar lung mass extends to the chest wall measuring over 6 cm. 6. Numerous pulmonary metastases. Electronically Signed: By: San Morelle M.D. On: 06/14/2021 14:09   DG CHEST PORT 1 VIEW  Result Date: 06/14/2021 CLINICAL DATA:  Lung cancer.  Altered mental status. EXAM: PORTABLE CHEST 1 VIEW COMPARISON:  06/09/2021 FINDINGS: Left upper lobe mass and numerous bilateral pulmonary nodules are again noted as seen on prior chest x-ray and chest CT. No significant change since prior study. Heart is normal size. No effusions or pneumothorax. No acute bony abnormality. IMPRESSION: Left upper lobe mass with numerous bilateral pulmonary metastases, unchanged. Electronically Signed   By: Rolm Baptise M.D.   On: 06/14/2021 19:02   MR THORACIC SPINE W WO CONTRAST  Result Date: 06/14/2021 CLINICAL DATA:  Brain/CNS neoplasm. Assess treatment response. History of non-small cell lung cancer. EXAM: MRI THORACIC WITHOUT AND WITH CONTRAST TECHNIQUE: Multiplanar and multiecho pulse sequences of the thoracic spine were obtained without and with intravenous contrast. CONTRAST:  7mL GADAVIST GADOBUTROL 1 MMOL/ML IV SOLN COMPARISON:  Thoracic MRI 05/20/2019. Abdominal CT 06/09/2021 and 05/12/2021. Chest CT 05/12/2021 and 04/29/2021. PET-CT 10/02/2020. FINDINGS: Alignment:  Normal. Vertebrae: There is widespread osseous metastatic disease throughout the visualized thoracic spine as well as the lower cervical and upper lumbar spine. The osseous involvement is greatest within the T3, T4, T5 and T11 vertebral bodies. No significant pathologic fractures are seen. Cord: Normal in signal and caliber. No abnormal intradural enhancement. Paraspinal and other soft tissues: As demonstrated on previous CTs, there are multiple pulmonary nodules bilaterally, largest in the left upper lobe.  There are scattered areas of epidural tumor, most prominent anteriorly on the right  at T3. No resulting cord deformity or high-grade foraminal narrowing. Disc levels: Mild multilevel spondylosis with disc bulging and facet hypertrophy, similar to previous MRI. No large disc herniation, high-grade spinal stenosis or definite nerve root encroachment. IMPRESSION: 1. Multifocal osseous metastatic disease with associated mild epidural tumor, most prominent at T3. 2. No evidence of cord compression or high-grade spinal stenosis. 3. Known widespread pulmonary metastases, grossly stable. 4. Chronic spondylosis, similar to previous MRI. Electronically Signed   By: Richardean Sale M.D.   On: 06/14/2021 18:19   MR BRAIN WO CONTRAST  Result Date: 06/14/2021 CLINICAL DATA:  Found unresponsive this morning. History of metastatic disease to the brain. EXAM: MRI HEAD WITHOUT CONTRAST TECHNIQUE: Multiplanar, multiecho pulse sequences of the brain and surrounding structures were obtained without intravenous contrast. COMPARISON:  Same-day CT/CTA head, brain MRI 05/07/2021 FINDINGS: Brain: There is cortically based diffusion restriction in the right insula and parietal cortex with associated mild FLAIR signal abnormality consistent with evolving acute infarct in the MCA distribution. There is no associated hemorrhage. Punctate foci of diffusion restriction in the pons and bilateral cerebellar hemispheres may reflect additional tiny infarcts versus signal abnormality related to known metastatic disease. The known small metastatic lesions seen on the prior MRI from 05/07/2021 are suboptimally assessed on the current study given lack of intravenous contrast. Small areas of edema most notably in the right frontal lobe (6-24) and left insula (6-18) are noted. Scattered small foci of SWI signal dropout likely reflect intralesional hemorrhage associated with the metastatic lesions. There is no significant mass effect. There is no midline shift. Vascular: The major intracranial flow voids are normal. Skull and upper  cervical spine: A few T1 hypointense lesions in the left parietal and occipital calvarium are not significantly changed going back multiple prior studies. There is no suspicious signal abnormality in the imaged upper cervical spine. Sinuses/Orbits: There is moderate mucosal thickening throughout the paranasal sinuses. Other: There are bilateral mastoid effusions, unchanged. IMPRESSION: 1. Diffusion restriction in the right insula and parietal lobe cortex consistent with evolving acute infarct in the MCA distribution. 2. Additional punctate foci of diffusion restriction in the cerebellum and pons may reflect additional tiny infarcts versus signal abnormality related to metastatic disease. 3. The known metastatic lesions are suboptimally assessed on the current study given lack of intravenous contrast. There is no significant mass effect or midline shift. These results were called by telephone at the time of interpretation on 06/14/2021 at 5:27 pm to provider Dr Francia Greaves, who verbally acknowledged these results. Electronically Signed   By: Valetta Mole M.D.   On: 06/14/2021 17:25   CT HEAD CODE STROKE WO CONTRAST  Result Date: 06/14/2021 CLINICAL DATA:  Code stroke. Neuro deficit, acute, stroke suspected. Personal history of lung cancer. Brain metastases. EXAM: CT HEAD WITHOUT CONTRAST TECHNIQUE: Contiguous axial images were obtained from the base of the skull through the vertex without intravenous contrast. RADIATION DOSE REDUCTION: This exam was performed according to the departmental dose-optimization program which includes automated exposure control, adjustment of the mA and/or kV according to patient size and/or use of iterative reconstruction technique. COMPARISON:  MR head without and with contrast 05/07/2021. FINDINGS: Brain: The small metastases identified by MRI are not discerned on the CT scan. Minimal edema is noted adjacent to the left insular lesion. Mild diffuse white matter disease is again noted.  The ventricles are of normal size. No significant extraaxial fluid collection  is present. The brainstem and cerebellum are within normal limits. Vascular: Atherosclerotic calcifications are present within the cavernous internal carotid arteries. No hyperdense vessel is present. Skull: Calvarium is intact. No focal lytic or blastic lesions are present. No significant extracranial soft tissue lesion is present. Sinuses/Orbits: Diffuse mucosal thickening is present throughout the ethmoid air cells, sphenoid sinuses and left greater than right maxillary sinus. Diffuse mucosal thickening is present in the frontal sinuses. Fluid levels are present in the sphenoid sinuses. The globes and orbits are within normal limits. ASPECTS [facility] Stroke Program Early CT Score) - Ganglionic level infarction (caudate, lentiform nuclei, internal capsule, insula, M1-M3 cortex): 7/7 - Supraganglionic infarction (M4-M6 cortex): 3/3 Total score (0-10 with 10 being normal): 10/10 IMPRESSION: 1. The small metastases identified by MRI are not discerned on the CT scan. 2. Minimal edema adjacent to the left insular lesion. 3. Otherwise stable appearance of diffuse white matter disease. 4. ASPECTS is 10/10. The above was relayed via text pager to Dr. Theda Sers on 06/14/2021 at 13:34 . Electronically Signed   By: San Morelle M.D.   On: 06/14/2021 13:35     PERTINENT LAB RESULTS: CBC: Recent Labs    06/14/21 1318 06/14/21 1325  WBC 11.6*  --   HGB 9.6* 9.9*  HCT 31.1* 29.0*  PLT 50*  --    CMET CMP     Component Value Date/Time   NA 141 06/15/2021 0135   K 4.0 06/15/2021 0135   CL 105 06/15/2021 0135   CO2 22 06/15/2021 0135   GLUCOSE 93 06/15/2021 0135   BUN 16 06/15/2021 0135   CREATININE 0.77 06/15/2021 0135   CREATININE 0.67 06/09/2021 0738   CALCIUM 9.5 06/15/2021 0135   PROT 6.1 (L) 06/14/2021 1318   ALBUMIN 2.7 (L) 06/14/2021 1318   ALBUMIN 4.0 05/20/2019 1951   AST 31 06/14/2021 1318   AST 32 06/09/2021  0738   ALT 20 06/14/2021 1318   ALT 15 06/09/2021 0738   ALKPHOS 152 (H) 06/14/2021 1318   BILITOT 1.4 (H) 06/14/2021 1318   BILITOT 0.7 06/09/2021 0738   GFRNONAA >60 06/15/2021 0135   GFRNONAA >60 06/09/2021 0738   GFRAA >60 05/25/2019 0427    GFR Estimated Creatinine Clearance: 124.6 mL/min (by C-G formula based on SCr of 0.77 mg/dL). No results for input(s): "LIPASE", "AMYLASE" in the last 72 hours. No results for input(s): "CKTOTAL", "CKMB", "CKMBINDEX", "TROPONINI" in the last 72 hours. Invalid input(s): "POCBNP" No results for input(s): "DDIMER" in the last 72 hours. Recent Labs    06/14/21 1314  HGBA1C 4.9   Recent Labs    06/15/21 0135  CHOL 163  HDL 34*  LDLCALC 110*  TRIG 96  CHOLHDL 4.8   Recent Labs    06/14/21 1314  TSH 0.542   Recent Labs    06/14/21 1314  VITAMINB12 1,798*   Coags: Recent Labs    06/14/21 1318  INR 1.3*   Microbiology: No results found for this or any previous visit (from the past 240 hour(s)).  FURTHER DISCHARGE INSTRUCTIONS:  Get Medicines reviewed and adjusted: Please take all your medications with you for your next visit with your Primary MD  Laboratory/radiological data: Please request your Primary MD to go over all hospital tests and procedure/radiological results at the follow up, please ask your Primary MD to get all Hospital records sent to his/her office.  In some cases, they will be blood work, cultures and biopsy results pending at the time of your discharge. Please  request that your primary care M.D. goes through all the records of your hospital data and follows up on these results.  Also Note the following: If you experience worsening of your admission symptoms, develop shortness of breath, life threatening emergency, suicidal or homicidal thoughts you must seek medical attention immediately by calling 911 or calling your MD immediately  if symptoms less severe.  You must read complete  instructions/literature along with all the possible adverse reactions/side effects for all the Medicines you take and that have been prescribed to you. Take any new Medicines after you have completely understood and accpet all the possible adverse reactions/side effects.   Do not drive when taking Pain medications or sleeping medications (Benzodaizepines)  Do not take more than prescribed Pain, Sleep and Anxiety Medications. It is not advisable to combine anxiety,sleep and pain medications without talking with your primary care practitioner  Special Instructions: If you have smoked or chewed Tobacco  in the last 2 yrs please stop smoking, stop any regular Alcohol  and or any Recreational drug use.  Wear Seat belts while driving.  Please note: You were cared for by a hospitalist during your hospital stay. Once you are discharged, your primary care physician will handle any further medical issues. Please note that NO REFILLS for any discharge medications will be authorized once you are discharged, as it is imperative that you return to your primary care physician (or establish a relationship with a primary care physician if you do not have one) for your post hospital discharge needs so that they can reassess your need for medications and monitor your lab values.  Total Time spent coordinating discharge including counseling, education and face to face time equals less than 30 minutes.  SignedOren Binet 06/16/2021 11:21 AM

## 2021-06-16 NOTE — Progress Notes (Signed)
SLP Cancellation Note  Patient Details Name: Raymond [name] MRN: [id number] DOB: Nov 23, 1960   Cancelled treatment:       Reason Eval/Treat Not Completed: Other (comment) (Patient transitioning to comfort care with planned discharge to [facility] for hospice level care. SLP to s/o at this time.)  Sonia Baller, MA, CCC-SLP Speech Therapy

## 2021-06-16 NOTE — Progress Notes (Signed)
This chaplain responded to PMT RN-Haley referral for spiritual care. The chaplain understands the Pt. wife-Raymond [name] is requesting a visit.    Raymond [name] is feeding the Pt. ice chips as the chaplain enters. Raymond [name] remains attentive to the Pt. few words and expressions during the visit. Raymond [name] openly shares the anticipatory grief she is experiencing on top of the grief she is experiencing in the recent loss of her mother. The chaplain listens reflectively as Raymond [name] talks about the relationship of grief/loss and her personal resiliency in the 40 year relationship with the Pt.  Raymond [name] identifies her best friend-Angie and faith community as a source of strength on the end of life journey.    The chaplain lifts up the sacred space of sharing song and as the prayer. The chaplain invited Raymond [name] to call the chaplain as needed.  Chaplain Sallyanne Kuster [phone]

## 2021-06-16 NOTE — Progress Notes (Signed)
Patient QS:XQKSKS R Maggi      DOB: 06/19/1960      HNG:[id number]      Palliative Medicine Team    Subjective: Bedside symptom check completed. Wife present at time of visit.    Physical exam: Patient resting in bed with eyes closed at time of visit. Breathing even and non-labored, no excessive secretions noted. Patient without physical or non-verbal signs of pain or discomfort at this time. Patient opens eyes and interacts with this RN and wife, echoing what this RN says. Patient denies pain or discomfort, resting in the bed.     Assessment and plan: Patient with only complaint of dry mouth. Wife asking this RN about IV fluids to help with dry mouth, this RN educated on IVF at end of life and better alternatives. This RN discussed with bedside RN Antwanique about ice chips for comfort as patient is reportedly more alert today. Wife in agreement with transfer to residential hospice home once bed is available, the patient remains stable for transfer this morning. Wife appreciative of chaplain support and open to visit today. She also mentions that members of his church are coming today to visit. Bedside RN without any additional needs or concerns at this time. Josseline, PA to follow up today. Will continue to follow for any changes or advances.    Thank you for allowing the Palliative Medicine Team to assist in the care of this patient.     Damian Leavell, MSN, RN Palliative Medicine Team Team Phone: [phone]  This phone is monitored 7a-7p, please reach out to attending physician outside of these hours for urgent needs.

## 2021-06-16 NOTE — Progress Notes (Signed)
Report called to [facility] place at this time, Celestia Khat nurse. Asked to leave iv in place for this patient.

## 2021-06-16 NOTE — Progress Notes (Signed)
Pt. Had incontinent bowel movement and was cleaned up prior to departure with PTAR. Pt. Was noted to be in NAD with even and unlabored respirations at the time of transfer.

## 2021-06-16 NOTE — Progress Notes (Addendum)
  Radiation Oncology         [phone] ________________________________  Name: Raymond [name] MRN: [id number]  Date: 06/16/2021  DOB: 12-22-1960  End of Treatment Note  Diagnosis:   Progressive Stage IV, [id number], NSCLC, adenocarcinoma of the left lower lobe        Indication for treatment:  palliative       Radiation treatment dates:   06/07/21-06/11/21  Site/planned dose:   The pelvis was treated in two isocenters in 4 fractions for a total of 10 Gy, though his prescription was for 37.5 Gy over 15 fractions.  Narrative: The patient's status declined during his course of radiation, and he was hospitalized with acute stroke. His neurologic declined to where he was not able to come for additional radiation treatment and his treatment was cancelled.  Plan: The patient will be discharged with inpatient hospice care. We will be available as needed for his wife and if needed to answer questions about his care.      Carola Rhine, PAC

## 2021-06-17 ENCOUNTER — Ambulatory Visit: Payer: 59

## 2021-06-17 ENCOUNTER — Ambulatory Visit: Payer: Medicaid Other

## 2021-06-17 ENCOUNTER — Encounter: Payer: Self-pay | Admitting: Internal Medicine

## 2021-06-18 ENCOUNTER — Ambulatory Visit: Payer: Medicaid Other

## 2021-06-18 ENCOUNTER — Telehealth: Payer: Medicaid Other | Admitting: Nurse Practitioner

## 2021-06-21 ENCOUNTER — Ambulatory Visit: Payer: Medicaid Other

## 2021-06-21 ENCOUNTER — Ambulatory Visit: Payer: Self-pay | Admitting: Radiation Oncology

## 2021-06-22 ENCOUNTER — Ambulatory Visit: Payer: Medicaid Other

## 2021-06-22 ENCOUNTER — Telehealth: Payer: Self-pay

## 2021-06-22 ENCOUNTER — Other Ambulatory Visit: Payer: 59

## 2021-06-22 NOTE — Telephone Encounter (Signed)
Wife called to clarify why patient is not receiving treatment and has been placed on hospice care. Reviewed previous tx and most recent scans. Explained hospice philosophy. All questions answered to patient's satisfaction.

## 2021-06-23 ENCOUNTER — Ambulatory Visit: Payer: Medicaid Other

## 2021-06-23 ENCOUNTER — Other Ambulatory Visit: Payer: Medicaid Other

## 2021-06-23 ENCOUNTER — Encounter: Payer: Medicaid Other | Admitting: Nurse Practitioner

## 2021-06-23 ENCOUNTER — Ambulatory Visit: Payer: Medicaid Other | Admitting: Internal Medicine

## 2021-06-24 ENCOUNTER — Ambulatory Visit: Payer: Medicaid Other

## 2021-06-25 ENCOUNTER — Ambulatory Visit: Payer: Medicaid Other

## 2021-06-25 ENCOUNTER — Encounter: Payer: Self-pay | Admitting: *Deleted

## 2021-06-28 ENCOUNTER — Ambulatory Visit: Payer: Medicaid Other

## 2021-06-29 ENCOUNTER — Ambulatory Visit: Payer: Medicaid Other

## 2021-06-30 ENCOUNTER — Ambulatory Visit: Payer: Medicaid Other

## 2021-06-30 ENCOUNTER — Other Ambulatory Visit: Payer: Medicaid Other

## 2021-07-03 DEATH — deceased

## 2021-07-15 ENCOUNTER — Other Ambulatory Visit: Payer: Medicaid Other

## 2021-07-15 ENCOUNTER — Ambulatory Visit: Payer: Medicaid Other

## 2021-07-15 ENCOUNTER — Ambulatory Visit: Payer: Medicaid Other | Admitting: Physician Assistant

## 2021-07-22 ENCOUNTER — Ambulatory Visit: Payer: Medicaid Other

## 2021-07-22 ENCOUNTER — Other Ambulatory Visit: Payer: Medicaid Other

## 2021-08-05 ENCOUNTER — Other Ambulatory Visit: Payer: Medicaid Other

## 2021-08-05 ENCOUNTER — Ambulatory Visit: Payer: Medicaid Other

## 2021-08-05 ENCOUNTER — Ambulatory Visit: Payer: Medicaid Other | Admitting: Internal Medicine

## 2021-08-09 ENCOUNTER — Ambulatory Visit: Payer: 59 | Admitting: Family Medicine

## 2021-08-12 ENCOUNTER — Other Ambulatory Visit: Payer: Medicaid Other

## 2021-08-12 ENCOUNTER — Ambulatory Visit: Payer: Medicaid Other

## 2021-08-20 ENCOUNTER — Other Ambulatory Visit: Payer: Medicaid Other

## 2021-08-23 ENCOUNTER — Ambulatory Visit: Payer: Medicaid Other | Admitting: Internal Medicine

## 2021-08-26 ENCOUNTER — Ambulatory Visit: Payer: Medicaid Other

## 2021-08-26 ENCOUNTER — Other Ambulatory Visit: Payer: Medicaid Other

## 2021-08-26 ENCOUNTER — Ambulatory Visit: Payer: Medicaid Other | Admitting: Internal Medicine

## 2021-09-02 ENCOUNTER — Other Ambulatory Visit: Payer: Medicaid Other

## 2021-09-02 ENCOUNTER — Ambulatory Visit: Payer: Medicaid Other

## 2022-01-07 ENCOUNTER — Other Ambulatory Visit (HOSPITAL_COMMUNITY): Payer: Self-pay

## 2023-06-12 IMAGING — CT CT ABD-PELV W/ CM
2 of 5 series · 16 of 46 positions shown, 18 images · IV contrast (agent unspecified)
Comparison: 04/17/2021

CLINICAL DATA: Left lower quadrant abscess with drainage catheter
in place. Increasing drainage.

EXAM:
CT ABDOMEN AND PELVIS WITH CONTRAST
TECHNIQUE: Multidetector CT imaging of the abdomen and pelvis was performed
using the standard protocol following bolus administration of
intravenous contrast.

[Series 2: axial st · axial · 0.92mm/px · z∈[-541,-81]mm · 13 of 108 slices shown, 15 images]
[im 8/108  soft-tissue]
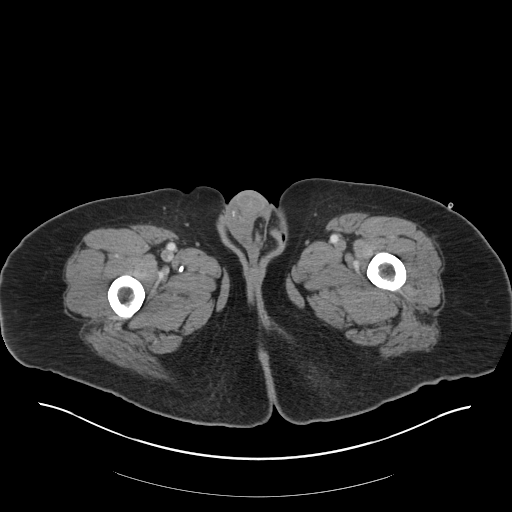
[im 8/108  bone]
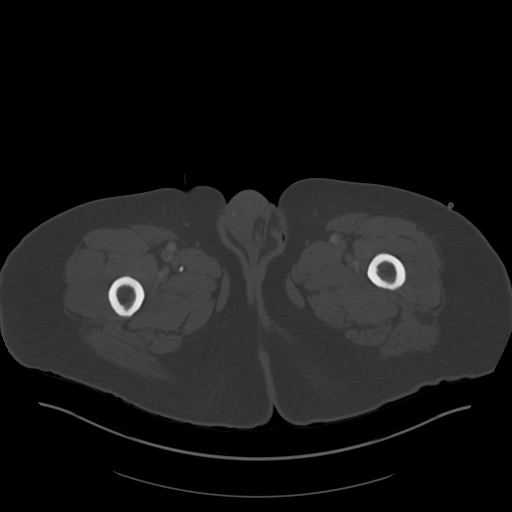
[im 16/108  soft-tissue]
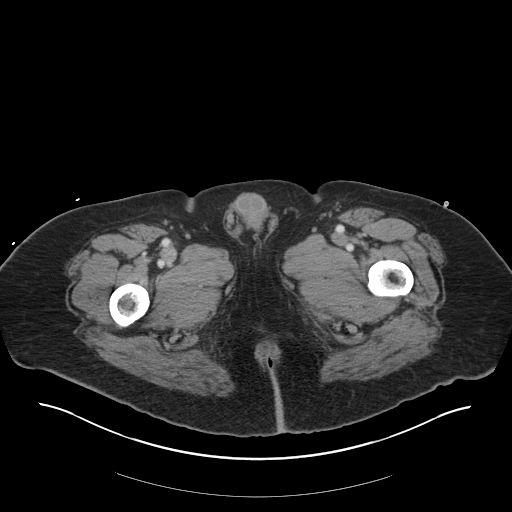
[im 23/108  soft-tissue]
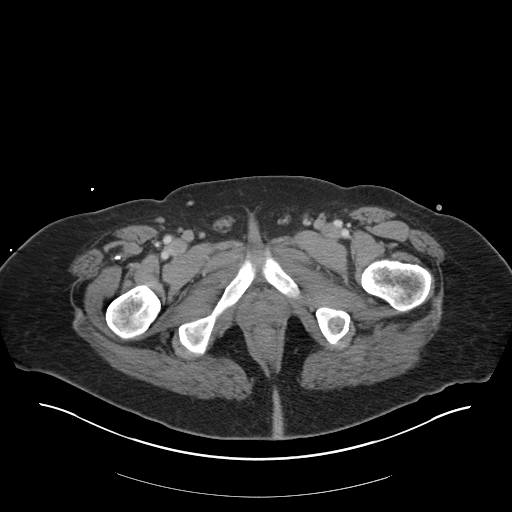
[im 31/108  soft-tissue]
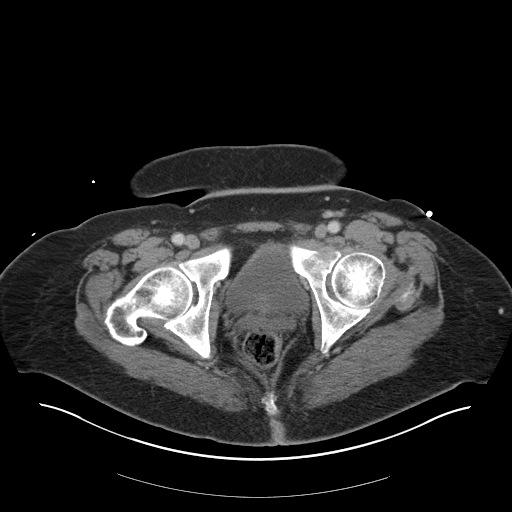
[im 39/108  soft-tissue]
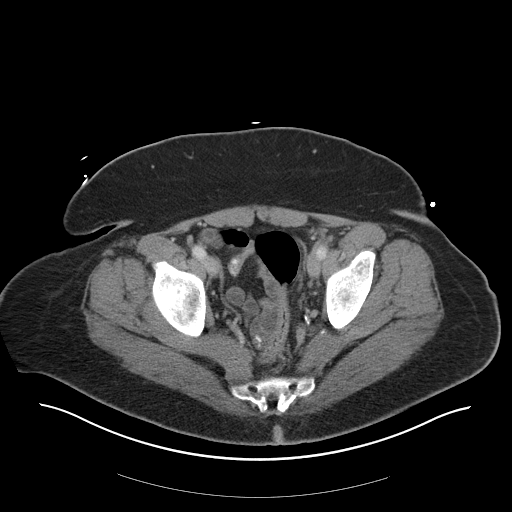
[im 46/108  soft-tissue]
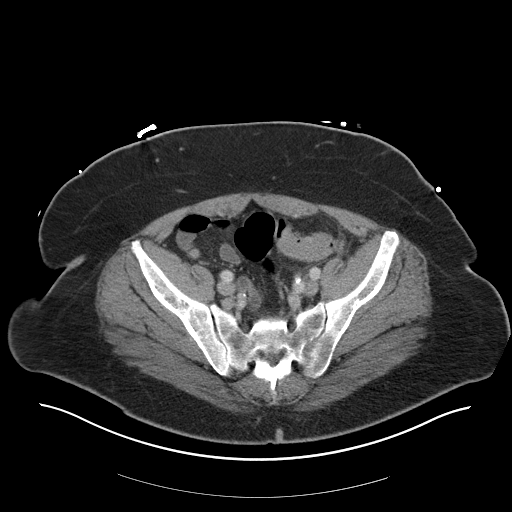
[im 54/108  soft-tissue]
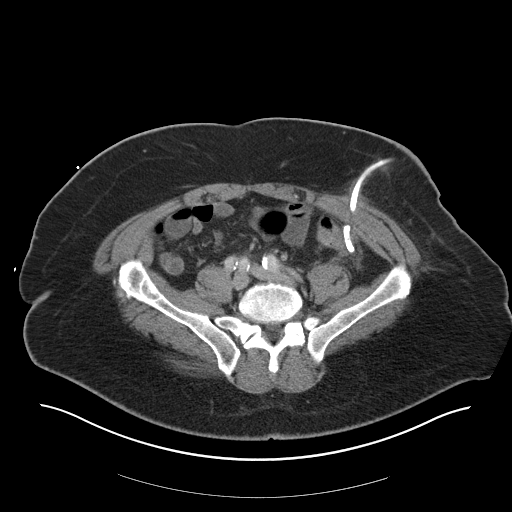
[im 62/108  soft-tissue]
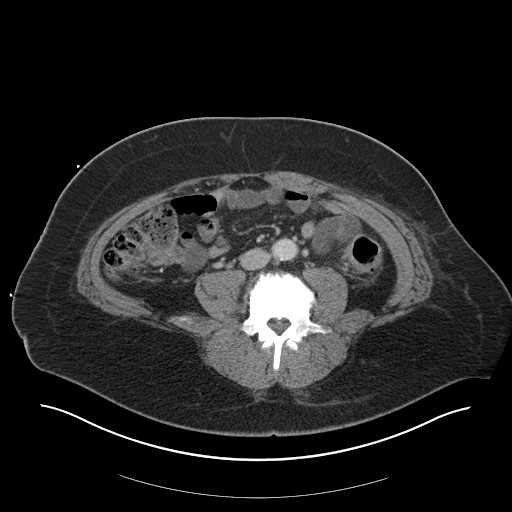
[im 69/108  soft-tissue]
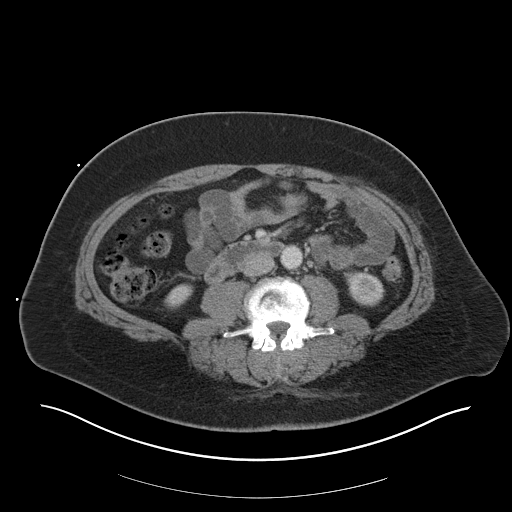
[im 69/108  bone]
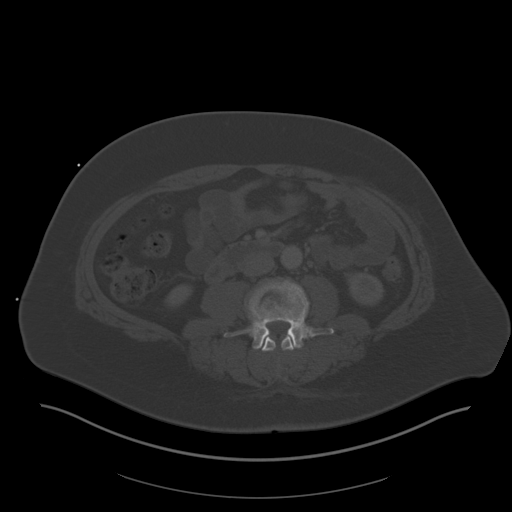
[im 77/108  soft-tissue]
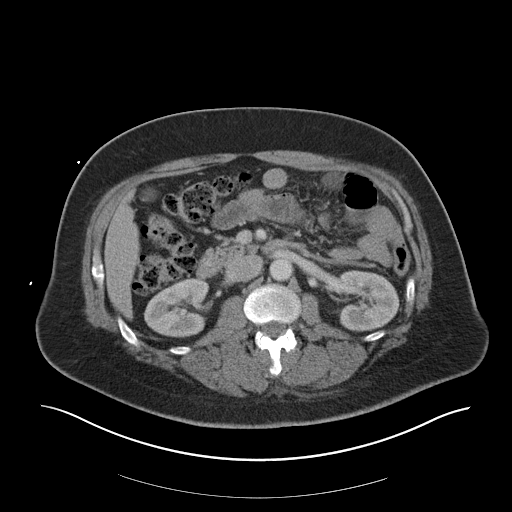
[im 85/108  soft-tissue]
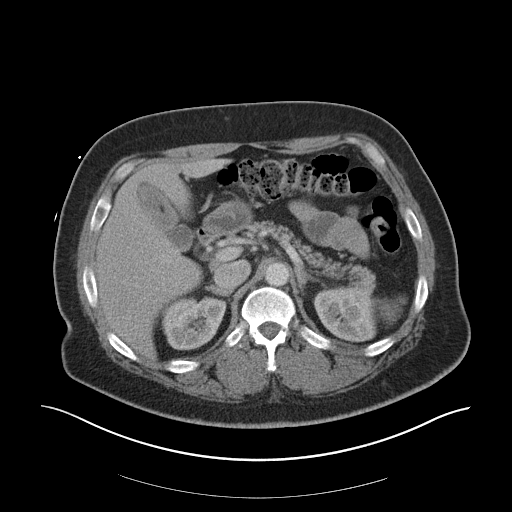
[im 92/108  soft-tissue]
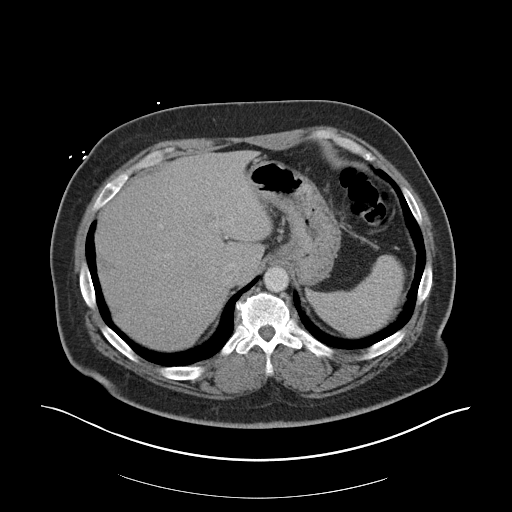
[im 100/108  soft-tissue]
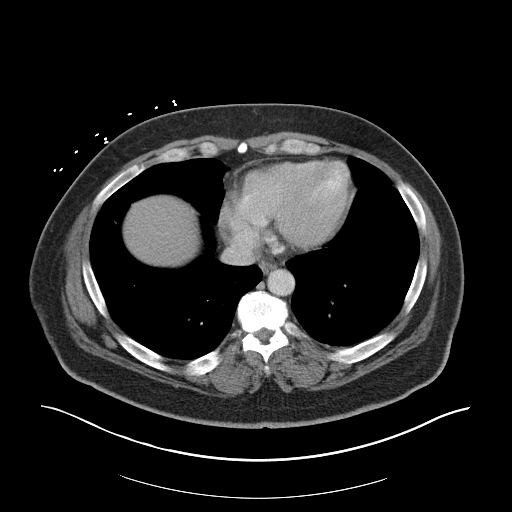

[Series 4: coronal st · coronal · 1.03mm/px · 3 of 145 slices shown]
[im 49/145  soft-tissue]
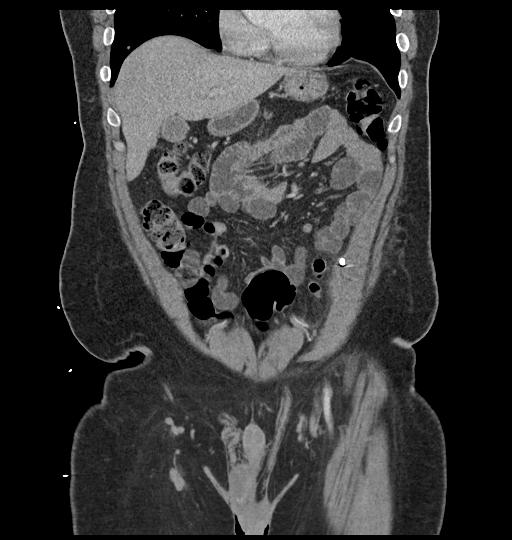
[im 65/145  soft-tissue]
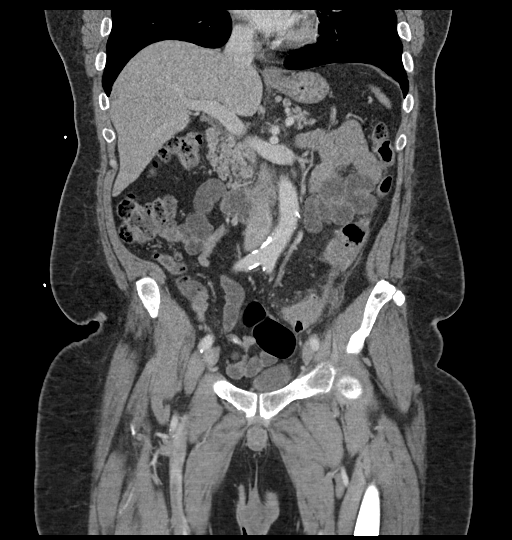
[im 81/145  soft-tissue]
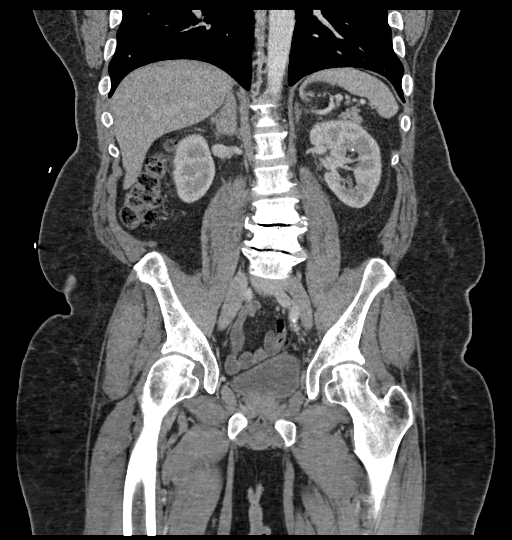

[16 of 46 positions shown; findings below may reference images not displayed]

RADIATION DOSE REDUCTION: This exam was performed according to the
departmental dose-optimization program which includes automated
exposure control, adjustment of the mA and/or kV according to
patient size and/or use of iterative reconstruction technique.

CONTRAST:  100mL OMNIPAQUE IOHEXOL 300 MG/ML  SOLN
FINDINGS: Lower chest: Numerous pulmonary nodules again seen in the lower
lobes, unchanged. No effusions. Heart is normal size.

Hepatobiliary: Multiple gallstones within the gallbladder. No
biliary ductal dilatation. No focal hepatic abnormality.

Pancreas: No focal abnormality or ductal dilatation.

Spleen: No focal abnormality.  Normal size.

Adrenals/Urinary Tract: Punctate 1 mm stones in the upper and
midpole of the left kidney. No stones on the right. No ureteral
stones or hydronephrosis. Urinary bladder and adrenal glands
unremarkable.

Stomach/Bowel: Stomach, large and small bowel grossly unremarkable.

Vascular/Lymphatic: Aortic and iliac calcifications. No evidence of
aneurysm or adenopathy.

Reproductive: No visible focal abnormality.

Other: No free fluid or free air. Left lower quadrant pigtail
drainage catheter remains in place adjacent to the proximal sigmoid
colon. No residual fluid collection around the drainage catheter.

Musculoskeletal: No acute bony abnormality.
IMPRESSION: Left lower quadrant drainage catheter remains in place adjacent to
the proximal sigmoid colon. No residual fluid collection surrounding
the catheter.

Punctate left nephrolithiasis.

Cholelithiasis.

No acute findings.

Aortic atherosclerosis.

## 2023-07-10 ENCOUNTER — Telehealth: Payer: Self-pay

## 2023-07-10 NOTE — Telephone Encounter (Signed)
 Spoke with patient's wife regarding potential exposure to Asbestosis. Informed her that after reviewing the patient's chart, there is no documentation indicating whether or not he was exposed. Advised her to consider reaching out to his pulmonary doctor or primary care provider for any additional information. She voiced her thanks and appreciation.

## 2023-07-19 ENCOUNTER — Telehealth: Payer: Self-pay

## 2023-07-19 NOTE — Telephone Encounter (Signed)
 Copied from CRM [id number]. Topic: Clinical - Medical Advice >> Jul 17, 2023  3:05 PM Russell PARAS wrote: Reason for CRM:   Pt's wife Ginny is contacting clinic to speak with nurse of Dr. Shelah. She has questions and is seeking further information concerning his medical conditions and treatments.   CB#[phone]  Dr. Shelah can you please advise with patient information as wife is requesting medical diagnosis & treatment provided.

## 2023-07-19 NOTE — Telephone Encounter (Signed)
 I spoke with the patient's wife.  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  Review of the record shows that he was diagnosed with adenocarcinoma of the lung, never had any history of mesothelioma.  I explained this information to her and offered my condolences on Raymond [name] passing.
